# Patient Record
Sex: Female | Born: 1970 | Race: Black or African American | Hispanic: No | Marital: Single | Smoking: Former smoker
Health system: Southern US, Community
[De-identification: ages and names within clinical notes are randomized; demographics above are authoritative.]

## PROBLEM LIST (undated history)

## (undated) DIAGNOSIS — I10 Essential (primary) hypertension: Secondary | ICD-10-CM

## (undated) DIAGNOSIS — N289 Disorder of kidney and ureter, unspecified: Secondary | ICD-10-CM

## (undated) DIAGNOSIS — I509 Heart failure, unspecified: Secondary | ICD-10-CM

## (undated) DIAGNOSIS — I639 Cerebral infarction, unspecified: Secondary | ICD-10-CM

---

## 2007-02-11 ENCOUNTER — Ambulatory Visit: Payer: Self-pay | Admitting: Obstetrics & Gynecology

## 2007-02-12 ENCOUNTER — Ambulatory Visit: Payer: Self-pay | Admitting: Obstetrics & Gynecology

## 2007-02-18 ENCOUNTER — Ambulatory Visit: Payer: Self-pay | Admitting: Family Medicine

## 2007-02-22 ENCOUNTER — Ambulatory Visit (HOSPITAL_COMMUNITY): Admission: RE | Admit: 2007-02-22 | Discharge: 2007-02-22 | Payer: Self-pay | Admitting: Obstetrics & Gynecology

## 2007-02-24 ENCOUNTER — Ambulatory Visit: Payer: Self-pay | Admitting: Obstetrics & Gynecology

## 2007-03-08 ENCOUNTER — Ambulatory Visit: Payer: Self-pay | Admitting: Obstetrics & Gynecology

## 2007-03-22 ENCOUNTER — Ambulatory Visit: Payer: Self-pay | Admitting: Family Medicine

## 2007-03-22 ENCOUNTER — Ambulatory Visit (HOSPITAL_COMMUNITY): Admission: RE | Admit: 2007-03-22 | Discharge: 2007-03-22 | Payer: Self-pay | Admitting: Obstetrics & Gynecology

## 2007-04-07 ENCOUNTER — Ambulatory Visit (HOSPITAL_COMMUNITY): Admission: RE | Admit: 2007-04-07 | Discharge: 2007-04-07 | Payer: Self-pay | Admitting: Obstetrics & Gynecology

## 2007-04-15 ENCOUNTER — Ambulatory Visit: Payer: Self-pay | Admitting: Obstetrics & Gynecology

## 2007-05-17 ENCOUNTER — Encounter: Payer: Self-pay | Admitting: *Deleted

## 2007-05-17 ENCOUNTER — Ambulatory Visit: Payer: Self-pay | Admitting: Obstetrics & Gynecology

## 2007-05-17 ENCOUNTER — Ambulatory Visit: Payer: Self-pay | Admitting: *Deleted

## 2007-05-17 ENCOUNTER — Inpatient Hospital Stay (HOSPITAL_COMMUNITY): Admission: AD | Admit: 2007-05-17 | Discharge: 2007-05-21 | Payer: Self-pay | Admitting: Obstetrics & Gynecology

## 2007-05-24 ENCOUNTER — Encounter: Payer: Self-pay | Admitting: Obstetrics & Gynecology

## 2007-05-24 ENCOUNTER — Ambulatory Visit: Payer: Self-pay | Admitting: Obstetrics & Gynecology

## 2007-05-24 ENCOUNTER — Ambulatory Visit (HOSPITAL_COMMUNITY): Admission: RE | Admit: 2007-05-24 | Discharge: 2007-05-24 | Payer: Self-pay | Admitting: Obstetrics & Gynecology

## 2007-05-25 ENCOUNTER — Inpatient Hospital Stay (HOSPITAL_COMMUNITY): Admission: AD | Admit: 2007-05-25 | Discharge: 2007-05-26 | Payer: Self-pay | Admitting: Gynecology

## 2007-05-25 ENCOUNTER — Ambulatory Visit: Payer: Self-pay | Admitting: *Deleted

## 2007-05-26 ENCOUNTER — Encounter: Payer: Self-pay | Admitting: Obstetrics & Gynecology

## 2007-06-16 ENCOUNTER — Ambulatory Visit: Payer: Self-pay | Admitting: Obstetrics and Gynecology

## 2009-05-11 IMAGING — US US RENAL
1 series · 14 of 25 positions shown · non-contrast
Comparison: none

CLINICAL DATA: Hypertension; 24 week pregnant patient.
 RENAL/URINARY TRACT ULTRASOUND ? 05/18/07:
TECHNIQUE: Complete ultrasound examination of the urinary tract was performed including evaluation of the kidneys, renal collecting systems, and urinary bladder.

[Series 1: us renal · 0.28mm/px · 14 of 34 slices shown]
[im 1/34]
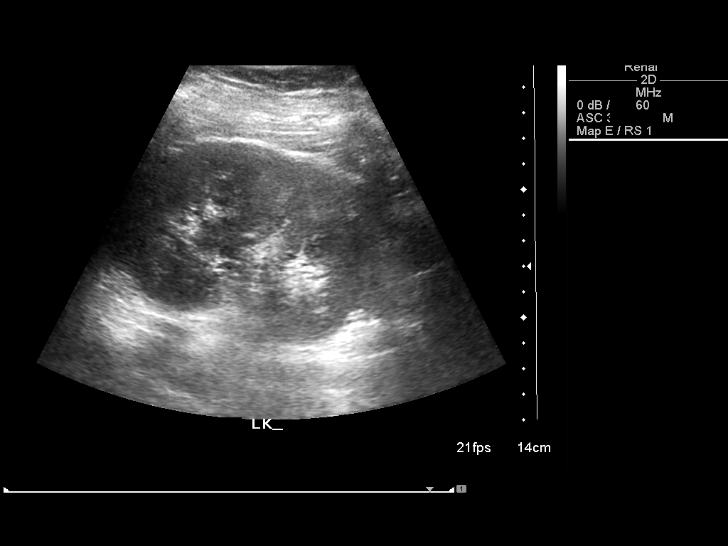
[im 3/34]
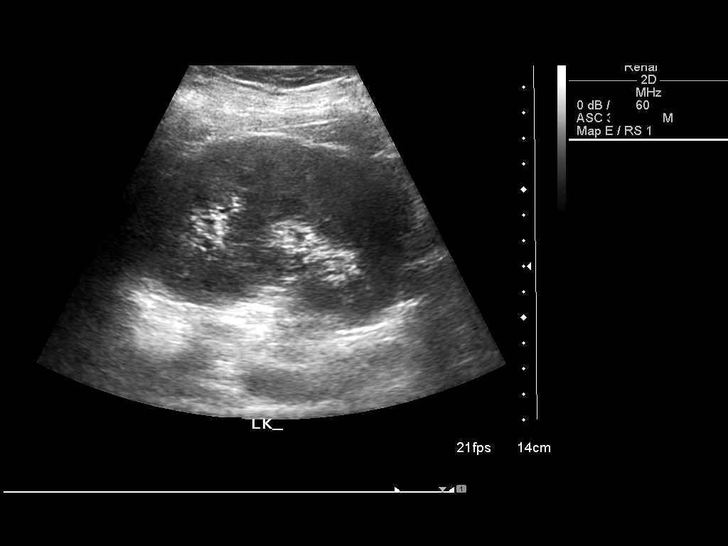
[im 6/34]
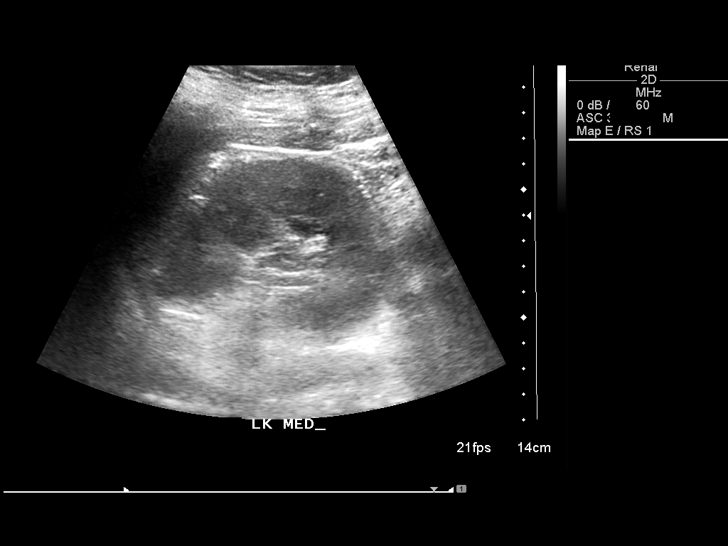
[im 9/34]
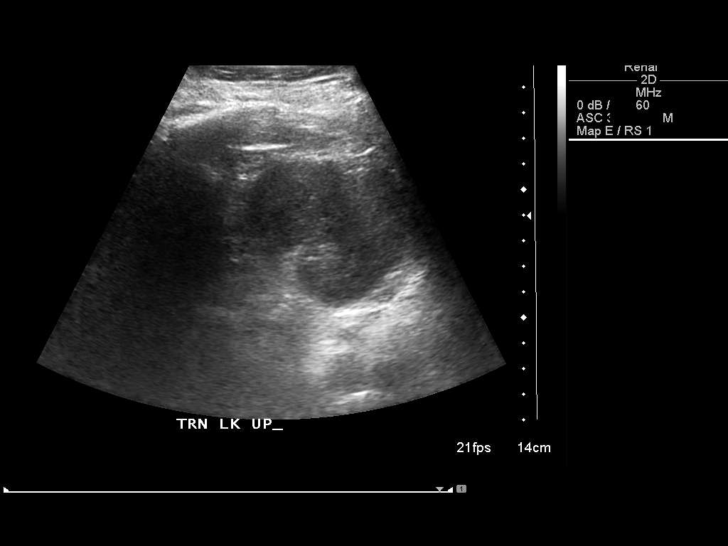
[im 12/34]
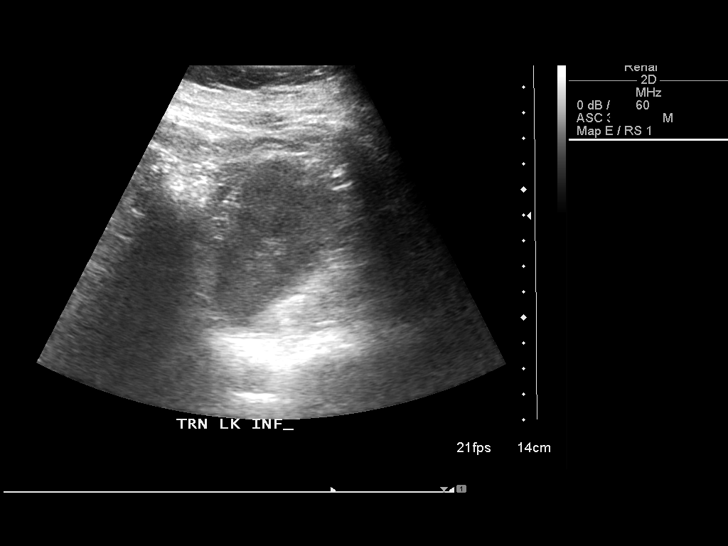
[im 13/34]
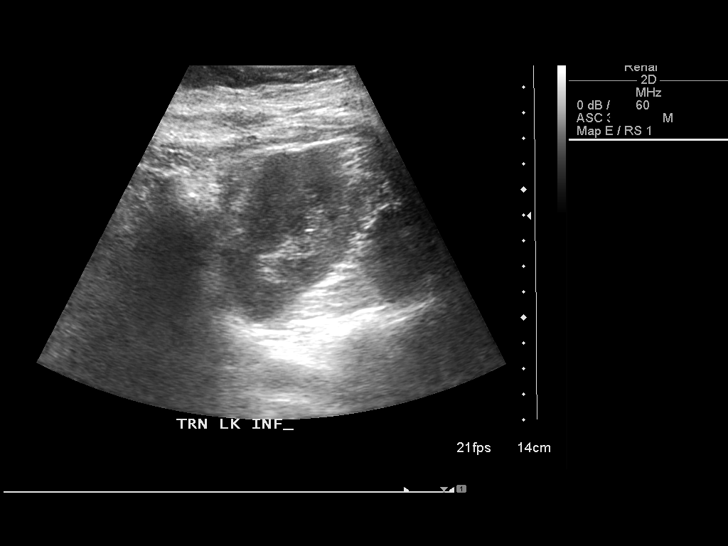
[im 16/34]
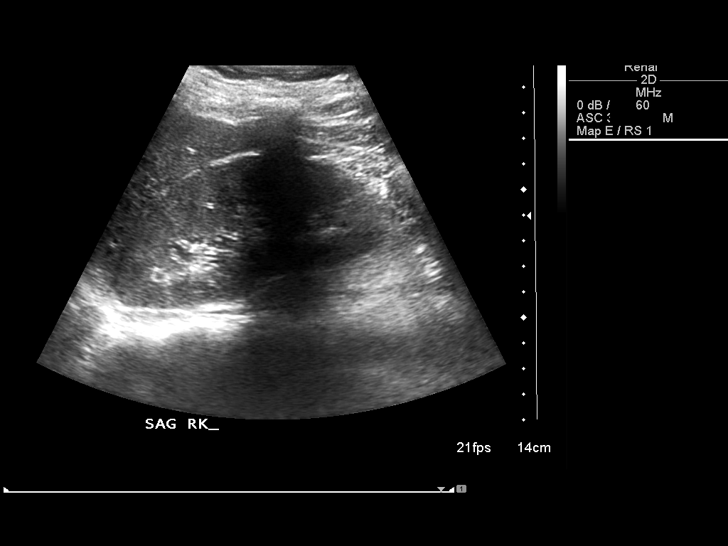
[im 18/34]
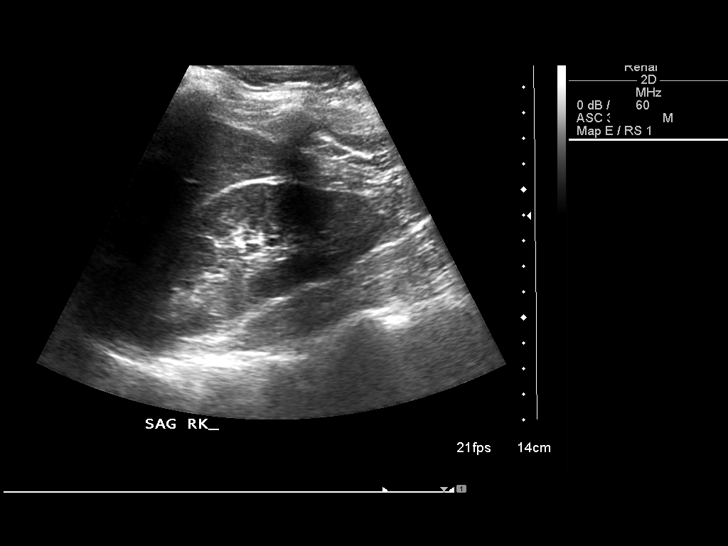
[im 21/34]
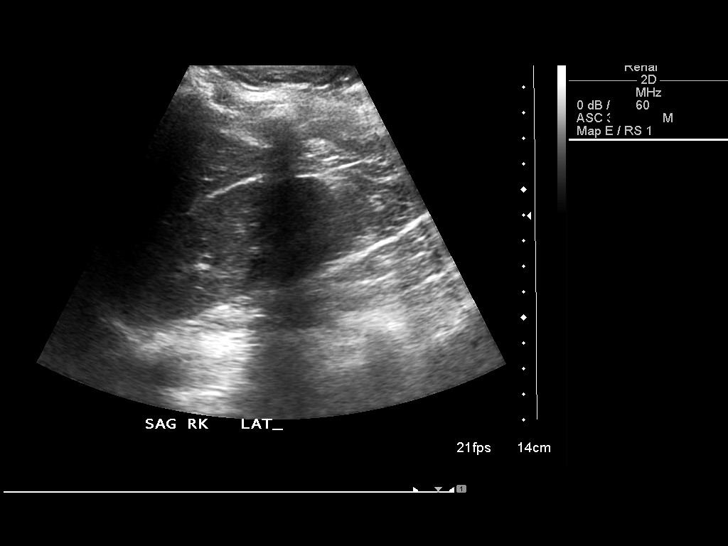
[im 23/34]
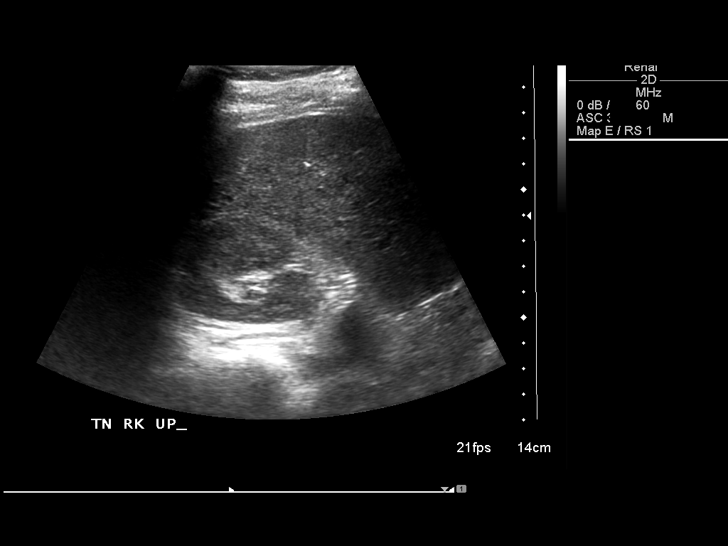
[im 25/34]
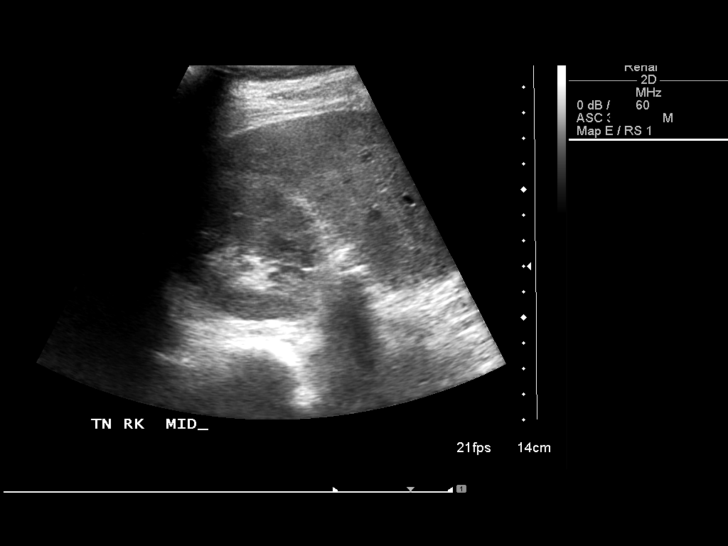
[im 28/34]
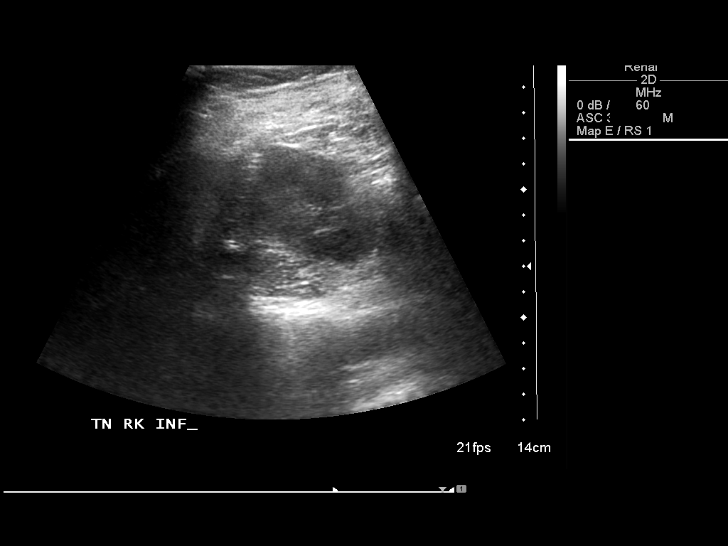
[im 31/34]
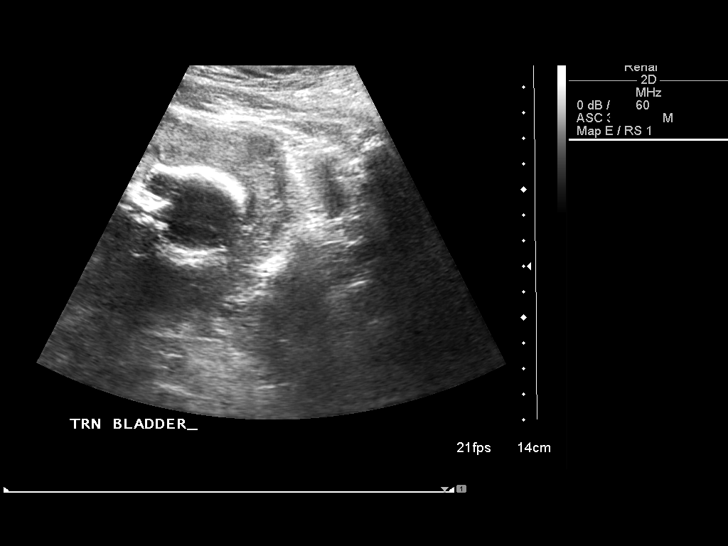
[im 34/34]
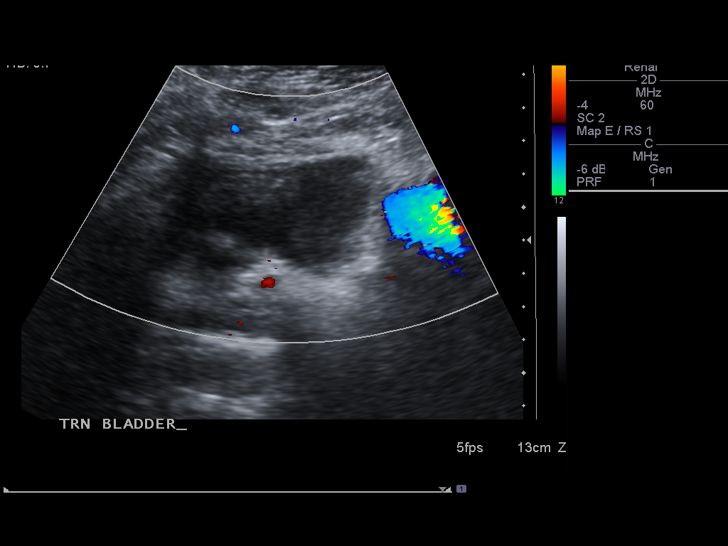

[14 of 25 positions shown; findings below may reference images not displayed]

FINDINGS: Both kidneys have a normal size, shape, and echotexture.  No hydronephrosis, renal mass, or echogenic calculi are seen. There is no renal cortical thinning bilaterally. The right kidney measures 11 cm in length, and the left kidney measures 10.1 cm in length.
IMPRESSION: Normal renal ultrasound.

## 2010-06-23 ENCOUNTER — Encounter: Payer: Self-pay | Admitting: *Deleted

## 2010-10-15 NOTE — Discharge Summary (Signed)
NAMEJADAYA, Allison Hill            ACCOUNT NO.:  [id number]   MEDICAL RECORD NO.:  [id number]          PATIENT TYPE:  WOC   LOCATION:  WOC                          FACILITY:  [facility]   PHYSICIAN:  Allie Bossier, MD        DATE OF BIRTH:  06/18/1970   DATE OF ADMISSION:  05/17/2007  DATE OF DISCHARGE:  05/21/2007                               DISCHARGE SUMMARY   ADMISSION DIAGNOSES:  1. Intrauterine pregnancy at 23 weeks, 6 days.  2. Chronic hypertension.  3. Concern for preeclampsia, rule out preeclampsia.  4. Intrauterine growth retardation.  5. Oligohydramnios.  6. History of intrauterine fetal demise x2.  7. History of spontaneous abortion x3.   DISCHARGE DIAGNOSES:  1. Intrauterine pregnancy at 23 weeks, 6 days.  2. Chronic hypertension.  3. Intrauterine growth retardation.  4. Oligohydramnios.  5. History of intrauterine fetal demise x2.  6. History of spontaneous abortion x3.   PROCEDURES:  1. Patient had an ultrasound performed on December 15th that showed      single gestation in cephalic position.  Placenta was fundal above      the cervical os.  AFI was 2.86.  Biophysical profile was 4/8.      Estimated fetal weight was 339 gm, which was less than the 10th      percentile.  2. Patient had a renal ultrasound performed on May 18, 2007      showing normal-appearing right and left kidneys without      hydronephrosis, mass, or calculi seen.   COMPLICATIONS:  None.   CONSULTATIONS:  1. Maternal fetal medicine was initially consulted at the time of      admission, Dr. Carolann Littler.  2. Neonatology:   PERTINENT LABORATORY FINDINGS:  Patient had an admission complete blood  count showing a white blood cell count of 8.8, hemoglobin 12.5,  hematocrit 36, platelets 227.  CMP showed sodium 139, potassium 3.4, AST  27, ALT 26.  The remainder of the labs on the CMP were normal.  Prothrombin time was 11.3.  PTT was 27.  LDH was 132.  Uric acid was  5.5.  A 24-hour urine  collection had a volume of 2700 ml, and a 24-hour  urine creatinine was 1504.  Creatinine clearance was 116.  A 24-hour  urine protein was 81.  Protein C was 121, within the normal range.  Protein S was 105, within normal range.  A phospholipid evaluation was  within normal limits.  Factor V gene mutation was negative.   BRIEF PERTINENT ADMISSION HISTORY:  This is a 40 year old G7, P1-2-3-1  presenting at 23 weeks, 6 days, admission for blood pressure control.  Patient does have chronic hypertension, a history of two intrauterine  fetal demises, and was seen in MFM on May 17, 2007.  At that time,  her blood pressures were 150s-160s/110s.  The patient was to be admitted  for evaluation of her hypertension, blood pressure control, as well as  evaluation for thrombophilia.   HOSPITAL COURSE:  The patient was admitted.  Her initial  antihypertensives of Norvasc 10 mg and labetalol 600  mg b.i.d. were  increased to labetalol 800 mg b.i.d.  She continued to run high blood  pressures up to the 180s/110s.  Her labetalol was increased to an  eventual dosage of 800 mg by mouth 3 times a day.  After reaching this  dosage, she was better controlled with her blood pressure in the 140s-  150s/80s-90s diastolic.  She did have a renal ultrasound that, as stated  above, was normal.  She had PIH labs and 24-hour urine done, which were  within normal range.  The 24-hour urine protein was 81.  She had a  thrombophilia workup that was within normal limits.  Patient did have a  neonatology consult to discuss the current prognosis for the infant, if  it was delivered the current stay.  Patient was made aware of the small  size of the infant and the difficulty in resuscitating this infant if it  were to deliver.  The patient was monitored briefly with Dopplers daily  for the first three days of admission, and they were all in the 140s.  Otherwise, there was no fetal monitoring performed.  She was stable   throughout her stay without any signs or symptoms of preeclampsia.  Her  physical exam was normal throughout her stay except for her elevated  blood pressures.  She was stable at the time of discharge.   DISCHARGE STATUS:  Stable.   DISCHARGE MEDICATIONS:  1. Continue prenatal vitamin 1 tablet p.o. daily.  2. Continue Norvasc 10 mg p.o. daily.  3. Labetalol 800 mg p.o. 3 times daily.   DISCHARGE INSTRUCTIONS:  1. Discharge home.  2. Regular diet.  3. Activity as tolerated.  Nothing per vagina for the remainder of the      pregnancy.  4. Patient is to follow up with maternal fetal care for an ultrasound      on June 01, 2007 at 9:30 a.m.  5. Patient is to follow up at the high risk clinic on May 24, 2007 at 10:45 for an appointment.      Karlton Lemon, MD  Electronically Signed     ______________________________  Allie Bossier, MD    NS/MEDQ  D:  05/21/2007  T:  05/21/2007  Job:  [id number]

## 2010-10-15 NOTE — Group Therapy Note (Signed)
NAMEKORTNEE, BAS NO.:  [id number]   MEDICAL RECORD NO.:  [id number]          PATIENT TYPE:  WOC   LOCATION:  [facility]                   FACILITY:  [facility]   PHYSICIAN:  Ginger Carne, MD DATE OF BIRTH:  01-15-1971   DATE OF SERVICE:  06/16/2007                                  CLINIC NOTE   CHIEF COMPLAINT:  Followup IUFD.   HISTORY OF PRESENT ILLNESS:  This is a 40 year old female who had an  intrauterine fetal demise on May 25, 2007 approximately 3 weeks  ago.  She has done well since this time.  Her bleeding has been minimal,  and mostly maroon-tinged mild bleeding.  She did have some cramping last  week; however, this has since been resolved.  She denies any fevers.  Overall she feels well.  She has a good support at home with her mother  helping her out a lot.  She does desire a pregnancy in the future,  however, not at this time.  She wants to be ready and wants her blood  pressure to be under control prior to getting pregnant.  Because of this  she requests Depo-Provera.  The patient has had protected sexual  intercourse within these 3 weeks without complications.   PHYSICAL EXAMINATION:  VITAL SIGNS:  Temp 97.7, pulse 83, blood pressure  156/106, repeated at 142/100.  Weight is 203.  GENERAL:  No acute distress.  GENITOURINARY EXAM:  Cervix is closed without any blood present at this  time.  There was white discharge.  The cervix is nontender.  Bimanual  exam was without masses.   ASSESSMENT AND PLAN:  A 40 year old female, about 3 weeks status post  intrauterine fetal demise.  1. Intrauterine fetal demise:  The patient has done well since this      time.  She seems to be doing well emotionally as well as      physically.  No further action is needed in regard to the      intrauterine fetal demise.  The patient was advised to not attempt      to get pregnant for another 6 months.  2. Family planning/contraception:  The patient desires  Depo-Provera      for contraception at this time.  She has used this before.  Even if      she has had sexual intercourse, although it was protected, the      patient was hesitant when stating this.  Just to be certain, we      will get a pregnancy test, and if negative, we will give her the      Depo-Provera shot today.  The nurse had just walked in, it was      negative.  We will proceed with Depo-Provera.  3. Hypertension:  The patient has chronic hypertension.  It was      uncontrolled today, and likely this is because the patient reduced      her labetalol to 600 mg b.i.d. down from 800 t.i.d.  I advised the      patient that she should increase back to her previous dose of 800  mg t.i.d.  Also, she needs to set up with a primary care physician      of some sort.  She lives in [place], we do not have a list of      physicians there; however, I advised her to call Dr. Alyce Pagan and      __________ , her [facility] physicians in [facility] who can provide her      with the name of a      primary care physician.  The patient said that these physicians,      Alyce Pagan and __________ were managing her hypertension prior to      this pregnancy.      Johney Maine, MD    ______________________________  Ginger Carne, MD    JT/MEDQ  D:  06/16/2007  T:  06/16/2007  Job:  [id number]

## 2011-02-20 LAB — POCT PREGNANCY, URINE
Operator id: 148111
Preg Test, Ur: NEGATIVE

## 2011-03-07 LAB — ANTIPHOSPHOLIPID SYNDROME EVAL, BLD
Antiphosphatidylserine IgM: <25 MPS U/mL (ref <25.0)
Lupus Anticoagulant: NOT DETECTED
PTT Lupus Anticoagulant: 40.7 (ref 36.3–48.8)

## 2011-03-07 LAB — COMPREHENSIVE METABOLIC PANEL
AST: 27
Albumin: 3.1 — ABNORMAL LOW
BUN: 8
Calcium: 9.3
Creatinine, Ser: 0.87
GFR calc non Af Amer: >60
Total Bilirubin: 0.4
Total Protein: 7

## 2011-03-07 LAB — POCT URINALYSIS DIP (DEVICE)
Bilirubin Urine: NEGATIVE
Glucose, UA: NEGATIVE
Hgb urine dipstick: NEGATIVE
Ketones, ur: NEGATIVE
Nitrite: NEGATIVE
Operator id: 194561
Protein, ur: NEGATIVE
pH: 5.5
pH: 7

## 2011-03-07 LAB — LACTATE DEHYDROGENASE: LDH: 132

## 2011-03-07 LAB — CBC
HCT: 33.6 — ABNORMAL LOW
HCT: 36
HCT: 37.4
Hemoglobin: 11.7 — ABNORMAL LOW
Hemoglobin: 12.5
MCHC: 34.6
MCHC: 34.6
MCV: 87.2
MCV: 88.5
MCV: 88.5
RBC: 3.85 — ABNORMAL LOW
RBC: 4.07
RBC: 4.22
WBC: 9.8

## 2011-03-07 LAB — FIBRINOGEN: Fibrinogen: 539 — ABNORMAL HIGH

## 2011-03-07 LAB — PROTIME-INR
INR: 0.8
INR: 0.8
Prothrombin Time: 11.4 — ABNORMAL LOW

## 2011-03-07 LAB — D-DIMER, QUANTITATIVE: D-Dimer, Quant: 0.66 — ABNORMAL HIGH

## 2011-03-07 LAB — CREATININE CLEARANCE, URINE, 24 HOUR: Creatinine: 0.9

## 2011-03-07 LAB — PROTEIN, URINE, 24 HOUR: Collection Interval-UPROT: 24

## 2011-03-12 LAB — POCT URINALYSIS DIP (DEVICE)
Hgb urine dipstick: NEGATIVE
Protein, ur: 30 — AB
Specific Gravity, Urine: >=1.03
pH: 6

## 2015-03-24 DIAGNOSIS — F1920 Other psychoactive substance dependence, uncomplicated: Secondary | ICD-10-CM | POA: Diagnosis present

## 2016-06-03 DIAGNOSIS — I10 Essential (primary) hypertension: Secondary | ICD-10-CM | POA: Diagnosis not present

## 2016-06-03 DIAGNOSIS — N390 Urinary tract infection, site not specified: Secondary | ICD-10-CM | POA: Diagnosis not present

## 2016-06-03 DIAGNOSIS — N183 Chronic kidney disease, stage 3 (moderate): Secondary | ICD-10-CM | POA: Diagnosis not present

## 2016-06-03 DIAGNOSIS — E559 Vitamin D deficiency, unspecified: Secondary | ICD-10-CM | POA: Diagnosis not present

## 2016-06-13 DIAGNOSIS — N183 Chronic kidney disease, stage 3 (moderate): Secondary | ICD-10-CM | POA: Diagnosis not present

## 2016-06-13 DIAGNOSIS — E559 Vitamin D deficiency, unspecified: Secondary | ICD-10-CM | POA: Diagnosis not present

## 2016-06-25 DIAGNOSIS — N183 Chronic kidney disease, stage 3 (moderate): Secondary | ICD-10-CM | POA: Diagnosis not present

## 2016-06-25 DIAGNOSIS — N390 Urinary tract infection, site not specified: Secondary | ICD-10-CM | POA: Diagnosis not present

## 2016-06-25 DIAGNOSIS — I10 Essential (primary) hypertension: Secondary | ICD-10-CM | POA: Diagnosis not present

## 2016-07-23 DIAGNOSIS — Z113 Encounter for screening for infections with a predominantly sexual mode of transmission: Secondary | ICD-10-CM | POA: Diagnosis not present

## 2016-07-23 DIAGNOSIS — Z30432 Encounter for removal of intrauterine contraceptive device: Secondary | ICD-10-CM | POA: Diagnosis not present

## 2016-07-23 DIAGNOSIS — R5383 Other fatigue: Secondary | ICD-10-CM | POA: Diagnosis not present

## 2016-07-27 DIAGNOSIS — Z8673 Personal history of transient ischemic attack (TIA), and cerebral infarction without residual deficits: Secondary | ICD-10-CM | POA: Diagnosis not present

## 2016-07-27 DIAGNOSIS — F17210 Nicotine dependence, cigarettes, uncomplicated: Secondary | ICD-10-CM | POA: Diagnosis not present

## 2016-07-27 DIAGNOSIS — D573 Sickle-cell trait: Secondary | ICD-10-CM | POA: Diagnosis not present

## 2016-07-27 DIAGNOSIS — F1410 Cocaine abuse, uncomplicated: Secondary | ICD-10-CM | POA: Diagnosis not present

## 2016-07-27 DIAGNOSIS — R069 Unspecified abnormalities of breathing: Secondary | ICD-10-CM | POA: Diagnosis not present

## 2016-07-27 DIAGNOSIS — R079 Chest pain, unspecified: Secondary | ICD-10-CM | POA: Diagnosis not present

## 2016-07-27 DIAGNOSIS — E876 Hypokalemia: Secondary | ICD-10-CM | POA: Diagnosis not present

## 2016-07-27 DIAGNOSIS — I129 Hypertensive chronic kidney disease with stage 1 through stage 4 chronic kidney disease, or unspecified chronic kidney disease: Secondary | ICD-10-CM | POA: Diagnosis not present

## 2016-07-27 DIAGNOSIS — R0602 Shortness of breath: Secondary | ICD-10-CM | POA: Diagnosis not present

## 2016-07-27 DIAGNOSIS — N189 Chronic kidney disease, unspecified: Secondary | ICD-10-CM | POA: Diagnosis not present

## 2016-09-10 DIAGNOSIS — H47211 Primary optic atrophy, right eye: Secondary | ICD-10-CM | POA: Diagnosis not present

## 2016-09-10 DIAGNOSIS — H52223 Regular astigmatism, bilateral: Secondary | ICD-10-CM | POA: Diagnosis not present

## 2016-09-10 DIAGNOSIS — H5213 Myopia, bilateral: Secondary | ICD-10-CM | POA: Diagnosis not present

## 2016-09-10 DIAGNOSIS — H53451 Other localized visual field defect, right eye: Secondary | ICD-10-CM | POA: Diagnosis not present

## 2016-09-12 DIAGNOSIS — D573 Sickle-cell trait: Secondary | ICD-10-CM | POA: Diagnosis not present

## 2016-09-12 DIAGNOSIS — K5732 Diverticulitis of large intestine without perforation or abscess without bleeding: Secondary | ICD-10-CM | POA: Diagnosis not present

## 2016-09-12 DIAGNOSIS — R350 Frequency of micturition: Secondary | ICD-10-CM | POA: Diagnosis not present

## 2016-09-12 DIAGNOSIS — Z8673 Personal history of transient ischemic attack (TIA), and cerebral infarction without residual deficits: Secondary | ICD-10-CM | POA: Diagnosis not present

## 2016-09-12 DIAGNOSIS — R1031 Right lower quadrant pain: Secondary | ICD-10-CM | POA: Diagnosis not present

## 2016-09-12 DIAGNOSIS — I10 Essential (primary) hypertension: Secondary | ICD-10-CM | POA: Diagnosis not present

## 2016-09-12 DIAGNOSIS — F17210 Nicotine dependence, cigarettes, uncomplicated: Secondary | ICD-10-CM | POA: Diagnosis not present

## 2016-09-12 DIAGNOSIS — I129 Hypertensive chronic kidney disease with stage 1 through stage 4 chronic kidney disease, or unspecified chronic kidney disease: Secondary | ICD-10-CM | POA: Diagnosis not present

## 2016-09-12 DIAGNOSIS — Z8249 Family history of ischemic heart disease and other diseases of the circulatory system: Secondary | ICD-10-CM | POA: Diagnosis not present

## 2016-09-12 DIAGNOSIS — R109 Unspecified abdominal pain: Secondary | ICD-10-CM | POA: Diagnosis not present

## 2016-09-12 DIAGNOSIS — N189 Chronic kidney disease, unspecified: Secondary | ICD-10-CM | POA: Diagnosis not present

## 2016-09-26 DIAGNOSIS — I10 Essential (primary) hypertension: Secondary | ICD-10-CM | POA: Diagnosis not present

## 2016-09-26 DIAGNOSIS — N183 Chronic kidney disease, stage 3 (moderate): Secondary | ICD-10-CM | POA: Diagnosis not present

## 2016-10-16 DIAGNOSIS — K625 Hemorrhage of anus and rectum: Secondary | ICD-10-CM | POA: Diagnosis not present

## 2016-10-16 DIAGNOSIS — Z5321 Procedure and treatment not carried out due to patient leaving prior to being seen by health care provider: Secondary | ICD-10-CM | POA: Diagnosis not present

## 2016-12-08 DIAGNOSIS — R404 Transient alteration of awareness: Secondary | ICD-10-CM | POA: Diagnosis not present

## 2016-12-08 DIAGNOSIS — R42 Dizziness and giddiness: Secondary | ICD-10-CM | POA: Diagnosis not present

## 2016-12-31 DIAGNOSIS — M722 Plantar fascial fibromatosis: Secondary | ICD-10-CM | POA: Diagnosis not present

## 2016-12-31 DIAGNOSIS — M2141 Flat foot [pes planus] (acquired), right foot: Secondary | ICD-10-CM | POA: Diagnosis not present

## 2017-01-05 DIAGNOSIS — Z01419 Encounter for gynecological examination (general) (routine) without abnormal findings: Secondary | ICD-10-CM | POA: Diagnosis not present

## 2017-01-05 DIAGNOSIS — N926 Irregular menstruation, unspecified: Secondary | ICD-10-CM | POA: Diagnosis not present

## 2017-01-05 DIAGNOSIS — N898 Other specified noninflammatory disorders of vagina: Secondary | ICD-10-CM | POA: Diagnosis not present

## 2017-01-20 DIAGNOSIS — N938 Other specified abnormal uterine and vaginal bleeding: Secondary | ICD-10-CM | POA: Diagnosis not present

## 2017-02-11 DIAGNOSIS — S01511A Laceration without foreign body of lip, initial encounter: Secondary | ICD-10-CM | POA: Diagnosis not present

## 2017-02-11 DIAGNOSIS — R531 Weakness: Secondary | ICD-10-CM | POA: Diagnosis not present

## 2017-02-11 DIAGNOSIS — I120 Hypertensive chronic kidney disease with stage 5 chronic kidney disease or end stage renal disease: Secondary | ICD-10-CM | POA: Diagnosis not present

## 2017-02-11 DIAGNOSIS — R42 Dizziness and giddiness: Secondary | ICD-10-CM | POA: Diagnosis not present

## 2017-02-11 DIAGNOSIS — R9089 Other abnormal findings on diagnostic imaging of central nervous system: Secondary | ICD-10-CM | POA: Diagnosis not present

## 2017-02-11 DIAGNOSIS — R404 Transient alteration of awareness: Secondary | ICD-10-CM | POA: Diagnosis not present

## 2017-02-11 DIAGNOSIS — Y999 Unspecified external cause status: Secondary | ICD-10-CM | POA: Diagnosis not present

## 2017-02-11 DIAGNOSIS — E876 Hypokalemia: Secondary | ICD-10-CM | POA: Diagnosis not present

## 2017-02-11 DIAGNOSIS — R9431 Abnormal electrocardiogram [ECG] [EKG]: Secondary | ICD-10-CM | POA: Diagnosis not present

## 2017-02-11 DIAGNOSIS — M542 Cervicalgia: Secondary | ICD-10-CM | POA: Diagnosis not present

## 2017-02-11 DIAGNOSIS — W1839XA Other fall on same level, initial encounter: Secondary | ICD-10-CM | POA: Diagnosis not present

## 2017-02-11 DIAGNOSIS — S025XXA Fracture of tooth (traumatic), initial encounter for closed fracture: Secondary | ICD-10-CM | POA: Diagnosis not present

## 2017-02-11 DIAGNOSIS — R55 Syncope and collapse: Secondary | ICD-10-CM | POA: Diagnosis not present

## 2017-05-27 DIAGNOSIS — D571 Sickle-cell disease without crisis: Secondary | ICD-10-CM | POA: Diagnosis not present

## 2017-05-27 DIAGNOSIS — I517 Cardiomegaly: Secondary | ICD-10-CM | POA: Diagnosis not present

## 2017-05-27 DIAGNOSIS — K219 Gastro-esophageal reflux disease without esophagitis: Secondary | ICD-10-CM | POA: Diagnosis not present

## 2017-05-27 DIAGNOSIS — I129 Hypertensive chronic kidney disease with stage 1 through stage 4 chronic kidney disease, or unspecified chronic kidney disease: Secondary | ICD-10-CM | POA: Diagnosis not present

## 2017-05-27 DIAGNOSIS — R2689 Other abnormalities of gait and mobility: Secondary | ICD-10-CM | POA: Diagnosis not present

## 2017-05-27 DIAGNOSIS — F1420 Cocaine dependence, uncomplicated: Secondary | ICD-10-CM | POA: Diagnosis not present

## 2017-05-27 DIAGNOSIS — F1210 Cannabis abuse, uncomplicated: Secondary | ICD-10-CM | POA: Diagnosis not present

## 2017-05-27 DIAGNOSIS — R200 Anesthesia of skin: Secondary | ICD-10-CM | POA: Diagnosis not present

## 2017-05-27 DIAGNOSIS — N179 Acute kidney failure, unspecified: Secondary | ICD-10-CM | POA: Diagnosis not present

## 2017-05-27 DIAGNOSIS — Z7982 Long term (current) use of aspirin: Secondary | ICD-10-CM | POA: Diagnosis not present

## 2017-05-27 DIAGNOSIS — E876 Hypokalemia: Secondary | ICD-10-CM | POA: Diagnosis not present

## 2017-05-27 DIAGNOSIS — I10 Essential (primary) hypertension: Secondary | ICD-10-CM | POA: Diagnosis not present

## 2017-05-27 DIAGNOSIS — N2581 Secondary hyperparathyroidism of renal origin: Secondary | ICD-10-CM | POA: Diagnosis not present

## 2017-05-27 DIAGNOSIS — F1910 Other psychoactive substance abuse, uncomplicated: Secondary | ICD-10-CM | POA: Diagnosis not present

## 2017-05-27 DIAGNOSIS — I6381 Other cerebral infarction due to occlusion or stenosis of small artery: Secondary | ICD-10-CM | POA: Diagnosis not present

## 2017-05-27 DIAGNOSIS — N183 Chronic kidney disease, stage 3 (moderate): Secondary | ICD-10-CM | POA: Diagnosis not present

## 2017-05-27 DIAGNOSIS — I639 Cerebral infarction, unspecified: Secondary | ICD-10-CM | POA: Diagnosis not present

## 2017-05-27 DIAGNOSIS — F1410 Cocaine abuse, uncomplicated: Secondary | ICD-10-CM | POA: Diagnosis not present

## 2017-05-27 DIAGNOSIS — I6523 Occlusion and stenosis of bilateral carotid arteries: Secondary | ICD-10-CM | POA: Diagnosis not present

## 2017-05-27 DIAGNOSIS — R9431 Abnormal electrocardiogram [ECG] [EKG]: Secondary | ICD-10-CM | POA: Diagnosis not present

## 2017-05-27 DIAGNOSIS — F1010 Alcohol abuse, uncomplicated: Secondary | ICD-10-CM | POA: Diagnosis not present

## 2017-05-27 DIAGNOSIS — F17210 Nicotine dependence, cigarettes, uncomplicated: Secondary | ICD-10-CM | POA: Diagnosis not present

## 2017-05-27 DIAGNOSIS — R202 Paresthesia of skin: Secondary | ICD-10-CM | POA: Diagnosis not present

## 2017-06-03 ENCOUNTER — Other Ambulatory Visit: Payer: Self-pay | Admitting: *Deleted

## 2017-06-03 DIAGNOSIS — F1910 Other psychoactive substance abuse, uncomplicated: Secondary | ICD-10-CM | POA: Diagnosis not present

## 2017-06-03 DIAGNOSIS — I120 Hypertensive chronic kidney disease with stage 5 chronic kidney disease or end stage renal disease: Secondary | ICD-10-CM | POA: Diagnosis not present

## 2017-06-03 DIAGNOSIS — N184 Chronic kidney disease, stage 4 (severe): Secondary | ICD-10-CM | POA: Diagnosis not present

## 2017-06-03 DIAGNOSIS — I639 Cerebral infarction, unspecified: Secondary | ICD-10-CM | POA: Diagnosis not present

## 2017-06-03 DIAGNOSIS — R0789 Other chest pain: Secondary | ICD-10-CM | POA: Diagnosis not present

## 2017-06-03 DIAGNOSIS — I129 Hypertensive chronic kidney disease with stage 1 through stage 4 chronic kidney disease, or unspecified chronic kidney disease: Secondary | ICD-10-CM | POA: Diagnosis not present

## 2017-06-03 DIAGNOSIS — I6523 Occlusion and stenosis of bilateral carotid arteries: Secondary | ICD-10-CM | POA: Diagnosis not present

## 2017-06-03 DIAGNOSIS — R079 Chest pain, unspecified: Secondary | ICD-10-CM | POA: Diagnosis not present

## 2017-06-03 DIAGNOSIS — I6381 Other cerebral infarction due to occlusion or stenosis of small artery: Secondary | ICD-10-CM | POA: Diagnosis not present

## 2017-06-03 DIAGNOSIS — F1410 Cocaine abuse, uncomplicated: Secondary | ICD-10-CM | POA: Diagnosis not present

## 2017-06-03 DIAGNOSIS — R109 Unspecified abdominal pain: Secondary | ICD-10-CM | POA: Diagnosis not present

## 2017-06-03 DIAGNOSIS — R42 Dizziness and giddiness: Secondary | ICD-10-CM | POA: Diagnosis not present

## 2017-06-03 DIAGNOSIS — E785 Hyperlipidemia, unspecified: Secondary | ICD-10-CM | POA: Diagnosis not present

## 2017-06-03 DIAGNOSIS — Z7982 Long term (current) use of aspirin: Secondary | ICD-10-CM | POA: Diagnosis not present

## 2017-06-03 DIAGNOSIS — K219 Gastro-esophageal reflux disease without esophagitis: Secondary | ICD-10-CM | POA: Diagnosis not present

## 2017-06-03 DIAGNOSIS — R0602 Shortness of breath: Secondary | ICD-10-CM | POA: Diagnosis not present

## 2017-06-03 DIAGNOSIS — Z8673 Personal history of transient ischemic attack (TIA), and cerebral infarction without residual deficits: Secondary | ICD-10-CM | POA: Diagnosis not present

## 2017-06-03 DIAGNOSIS — I517 Cardiomegaly: Secondary | ICD-10-CM | POA: Diagnosis not present

## 2017-06-03 DIAGNOSIS — N183 Chronic kidney disease, stage 3 (moderate): Secondary | ICD-10-CM | POA: Diagnosis not present

## 2017-06-03 DIAGNOSIS — N186 End stage renal disease: Secondary | ICD-10-CM | POA: Diagnosis not present

## 2017-06-03 DIAGNOSIS — I10 Essential (primary) hypertension: Secondary | ICD-10-CM | POA: Diagnosis not present

## 2017-06-03 DIAGNOSIS — F17210 Nicotine dependence, cigarettes, uncomplicated: Secondary | ICD-10-CM | POA: Diagnosis not present

## 2017-06-03 NOTE — Patient Outreach (Addendum)
[facility]) Care Management  06/03/2017  Allison Hill Jun 30, 1970 [id number]  Transition of Care Referral  Referral Date: 06/03/17 Referral Source: [employer] Date of Discharge: 05/29/17 [facility] Discharge Diagnosis: N/A Insurance: [employer]  Outreach attempt #1 to patient. No answer. Number is invalid.  Plan: RN CM will contact patient within one week.   Wynelle ClevelandJuanita Zamyiah Tino, RN, BSN, MHA/MSL, [facility] Telephonic Care Manager Coordinator [facility] Direct Phone: [phone] Toll Free: [phone] Fax: [phone]

## 2017-06-04 ENCOUNTER — Ambulatory Visit: Payer: Self-pay | Admitting: *Deleted

## 2017-06-04 ENCOUNTER — Other Ambulatory Visit: Payer: Self-pay | Admitting: *Deleted

## 2017-06-04 DIAGNOSIS — F1020 Alcohol dependence, uncomplicated: Secondary | ICD-10-CM | POA: Diagnosis not present

## 2017-06-04 DIAGNOSIS — I6381 Other cerebral infarction due to occlusion or stenosis of small artery: Secondary | ICD-10-CM | POA: Diagnosis not present

## 2017-06-04 DIAGNOSIS — I1310 Hypertensive heart and chronic kidney disease without heart failure, with stage 1 through stage 4 chronic kidney disease, or unspecified chronic kidney disease: Secondary | ICD-10-CM | POA: Diagnosis not present

## 2017-06-04 DIAGNOSIS — R0789 Other chest pain: Secondary | ICD-10-CM | POA: Diagnosis not present

## 2017-06-04 DIAGNOSIS — K219 Gastro-esophageal reflux disease without esophagitis: Secondary | ICD-10-CM | POA: Diagnosis not present

## 2017-06-04 DIAGNOSIS — F419 Anxiety disorder, unspecified: Secondary | ICD-10-CM | POA: Diagnosis not present

## 2017-06-04 DIAGNOSIS — N184 Chronic kidney disease, stage 4 (severe): Secondary | ICD-10-CM | POA: Diagnosis not present

## 2017-06-04 DIAGNOSIS — Z7982 Long term (current) use of aspirin: Secondary | ICD-10-CM | POA: Diagnosis not present

## 2017-06-04 DIAGNOSIS — I10 Essential (primary) hypertension: Secondary | ICD-10-CM | POA: Diagnosis not present

## 2017-06-04 DIAGNOSIS — E7849 Other hyperlipidemia: Secondary | ICD-10-CM | POA: Diagnosis not present

## 2017-06-04 DIAGNOSIS — N183 Chronic kidney disease, stage 3 (moderate): Secondary | ICD-10-CM | POA: Diagnosis not present

## 2017-06-04 DIAGNOSIS — R079 Chest pain, unspecified: Secondary | ICD-10-CM | POA: Diagnosis not present

## 2017-06-04 DIAGNOSIS — I129 Hypertensive chronic kidney disease with stage 1 through stage 4 chronic kidney disease, or unspecified chronic kidney disease: Secondary | ICD-10-CM | POA: Diagnosis not present

## 2017-06-04 DIAGNOSIS — F1910 Other psychoactive substance abuse, uncomplicated: Secondary | ICD-10-CM | POA: Diagnosis not present

## 2017-06-04 DIAGNOSIS — Z8672 Personal history of thrombophlebitis: Secondary | ICD-10-CM | POA: Diagnosis not present

## 2017-06-04 DIAGNOSIS — F17210 Nicotine dependence, cigarettes, uncomplicated: Secondary | ICD-10-CM | POA: Diagnosis not present

## 2017-06-04 MED ORDER — GUAIFENESIN-DM 100-10 MG/5ML PO SYRP
5.00 | ORAL_SOLUTION | ORAL | Status: DC
Start: ? — End: 2017-06-04

## 2017-06-04 MED ORDER — CHOLECALCIFEROL 25 MCG (1000 UT) PO TABS
1000.00 | ORAL_TABLET | ORAL | Status: DC
Start: 2017-06-05 — End: 2017-06-04

## 2017-06-04 MED ORDER — ASPIRIN 325 MG PO TABS
325.00 | ORAL_TABLET | ORAL | Status: DC
Start: 2017-06-05 — End: 2017-06-04

## 2017-06-04 MED ORDER — GENERIC EXTERNAL MEDICATION
300.00 | Status: DC
Start: 2017-06-04 — End: 2017-06-04

## 2017-06-04 MED ORDER — HYDRALAZINE HCL 50 MG PO TABS
100.00 | ORAL_TABLET | ORAL | Status: DC
Start: 2017-06-04 — End: 2017-06-04

## 2017-06-04 MED ORDER — INFLUENZA VAC SPLIT QUAD 0.5 ML IM SUSY
.50 | PREFILLED_SYRINGE | INTRAMUSCULAR | Status: DC
Start: ? — End: 2017-06-04

## 2017-06-04 MED ORDER — LOSARTAN POTASSIUM 50 MG PO TABS
100.00 | ORAL_TABLET | ORAL | Status: DC
Start: 2017-06-05 — End: 2017-06-04

## 2017-06-04 MED ORDER — ONDANSETRON 4 MG PO TBDP
4.00 | ORAL_TABLET | ORAL | Status: DC
Start: ? — End: 2017-06-04

## 2017-06-04 MED ORDER — AMILORIDE HCL 5 MG PO TABS
5.00 | ORAL_TABLET | ORAL | Status: DC
Start: 2017-06-04 — End: 2017-06-04

## 2017-06-04 MED ORDER — ATORVASTATIN CALCIUM 40 MG PO TABS
40.00 | ORAL_TABLET | ORAL | Status: DC
Start: 2017-06-04 — End: 2017-06-04

## 2017-06-04 MED ORDER — HYDROXYZINE HCL 25 MG PO TABS
25.00 | ORAL_TABLET | ORAL | Status: DC
Start: ? — End: 2017-06-04

## 2017-06-04 MED ORDER — ONDANSETRON HCL 4 MG/2ML IJ SOLN
4.00 | INTRAMUSCULAR | Status: DC
Start: ? — End: 2017-06-04

## 2017-06-04 MED ORDER — NIFEDIPINE ER OSMOTIC RELEASE 30 MG PO TB24
90.00 | ORAL_TABLET | ORAL | Status: DC
Start: 2017-06-05 — End: 2017-06-04

## 2017-06-04 MED ORDER — ACETAMINOPHEN 325 MG PO TABS
650.00 | ORAL_TABLET | ORAL | Status: DC
Start: ? — End: 2017-06-04

## 2017-06-04 MED ORDER — HEPARIN SODIUM (PORCINE) 5000 UNIT/ML IJ SOLN
5000.00 | INTRAMUSCULAR | Status: DC
Start: 2017-06-04 — End: 2017-06-04

## 2017-06-04 MED ORDER — DOCUSATE SODIUM 100 MG PO CAPS
100.00 | ORAL_CAPSULE | ORAL | Status: DC
Start: ? — End: 2017-06-04

## 2017-06-04 NOTE — Patient Outreach (Signed)
[facility]) Care Management  06/04/2017  Lyman Spellerolissa Arbaugh 1970/09/25 [id number]   Transition of Care Referral  Referral Date: 06/03/17 Referral Source: [employer] Date of Discharge: 05/29/17 [facility] Discharge Diagnosis: N/A [facility]: [facility]  Outreach attempt # 2 to patient. No answer. Number is invalid.  Plan: RN CM will contact patient within one week.   Wynelle ClevelandJuanita Hinton Luellen, RN, BSN, MHA/MSL, [facility] Telephonic Care Manager Coordinator [facility] Direct Phone: [phone] Cell Phone: [phone] Toll Free: [phone] Fax: [phone]

## 2017-06-09 ENCOUNTER — Encounter: Payer: Self-pay | Admitting: *Deleted

## 2017-06-09 ENCOUNTER — Other Ambulatory Visit: Payer: Self-pay | Admitting: *Deleted

## 2017-06-09 ENCOUNTER — Ambulatory Visit: Payer: Self-pay | Admitting: *Deleted

## 2017-06-09 DIAGNOSIS — Z09 Encounter for follow-up examination after completed treatment for conditions other than malignant neoplasm: Secondary | ICD-10-CM | POA: Diagnosis not present

## 2017-06-09 DIAGNOSIS — I10 Essential (primary) hypertension: Secondary | ICD-10-CM | POA: Diagnosis not present

## 2017-06-09 NOTE — Patient Outreach (Signed)
[facility]) Care Management  06/09/2017  Allison Hill 04/15/1971 [id number]  Transition of Care Referral  Referral Date: 06/03/17 Referral Source: [employer] Date of Discharge: 05/29/17 [facility] Discharge Diagnosis: N/A Insurance: [employer]  Outreach attempt # 3 to patient. Patient stated, [facility] She requested a phone call back at a later time.  Plan: RN CM will send unsuccessful outreach letter to patient and will close case if no response from patient within 10 business days.  Wynelle ClevelandJuanita Treyvin Glidden, RN, BSN, MHA/MSL, [facility] Telephonic Care Manager Coordinator [facility] Direct Phone: [phone] Cell Phone: [phone] Toll Free: [phone] Fax: [phone]

## 2017-06-15 DIAGNOSIS — N183 Chronic kidney disease, stage 3 (moderate): Secondary | ICD-10-CM | POA: Diagnosis not present

## 2017-06-15 DIAGNOSIS — N390 Urinary tract infection, site not specified: Secondary | ICD-10-CM | POA: Diagnosis not present

## 2017-06-15 DIAGNOSIS — I10 Essential (primary) hypertension: Secondary | ICD-10-CM | POA: Diagnosis not present

## 2017-06-15 DIAGNOSIS — E559 Vitamin D deficiency, unspecified: Secondary | ICD-10-CM | POA: Diagnosis not present

## 2017-06-23 ENCOUNTER — Encounter: Payer: Self-pay | Admitting: *Deleted

## 2017-06-23 ENCOUNTER — Other Ambulatory Visit: Payer: Self-pay | Admitting: *Deleted

## 2017-06-23 ENCOUNTER — Ambulatory Visit: Payer: Self-pay | Admitting: *Deleted

## 2017-06-23 NOTE — Patient Outreach (Signed)
[facility]) Care Management  06/23/2017  Allison Hill 24-Dec-1970 [id number]  Case Closure  RN CM sent unsuccessful outreach letter to patient on 06/23/17. RN CM will close case due to no response from patient within 10 business days.  Plan:  RN CM will notify [facility] CM Assistantregarding case closure.  RN CM will send patient case closure letter.    Wynelle ClevelandJuanita Aime Meloche, RN, BSN, MHA/MSL, [facility] Telephonic Care Manager Coordinator [facility] Direct Phone: [phone] Cell Phone: [phone] Toll Free: [phone] Fax: [phone]

## 2017-07-30 DIAGNOSIS — R1110 Vomiting, unspecified: Secondary | ICD-10-CM | POA: Diagnosis not present

## 2017-07-30 DIAGNOSIS — R9431 Abnormal electrocardiogram [ECG] [EKG]: Secondary | ICD-10-CM | POA: Diagnosis not present

## 2017-07-30 DIAGNOSIS — I1310 Hypertensive heart and chronic kidney disease without heart failure, with stage 1 through stage 4 chronic kidney disease, or unspecified chronic kidney disease: Secondary | ICD-10-CM | POA: Diagnosis not present

## 2017-07-30 DIAGNOSIS — I517 Cardiomegaly: Secondary | ICD-10-CM | POA: Diagnosis not present

## 2017-07-30 DIAGNOSIS — N179 Acute kidney failure, unspecified: Secondary | ICD-10-CM | POA: Diagnosis not present

## 2017-07-30 DIAGNOSIS — K219 Gastro-esophageal reflux disease without esophagitis: Secondary | ICD-10-CM | POA: Diagnosis not present

## 2017-07-30 DIAGNOSIS — E860 Dehydration: Secondary | ICD-10-CM | POA: Diagnosis not present

## 2017-07-30 DIAGNOSIS — R748 Abnormal levels of other serum enzymes: Secondary | ICD-10-CM | POA: Diagnosis not present

## 2017-07-30 DIAGNOSIS — Z8249 Family history of ischemic heart disease and other diseases of the circulatory system: Secondary | ICD-10-CM | POA: Diagnosis not present

## 2017-07-30 DIAGNOSIS — Z7982 Long term (current) use of aspirin: Secondary | ICD-10-CM | POA: Diagnosis not present

## 2017-07-30 DIAGNOSIS — E876 Hypokalemia: Secondary | ICD-10-CM | POA: Diagnosis not present

## 2017-07-30 DIAGNOSIS — I082 Rheumatic disorders of both aortic and tricuspid valves: Secondary | ICD-10-CM | POA: Diagnosis not present

## 2017-07-30 DIAGNOSIS — F1020 Alcohol dependence, uncomplicated: Secondary | ICD-10-CM | POA: Diagnosis not present

## 2017-07-30 DIAGNOSIS — N189 Chronic kidney disease, unspecified: Secondary | ICD-10-CM | POA: Diagnosis not present

## 2017-07-30 DIAGNOSIS — H1089 Other conjunctivitis: Secondary | ICD-10-CM | POA: Diagnosis not present

## 2017-07-30 DIAGNOSIS — R197 Diarrhea, unspecified: Secondary | ICD-10-CM | POA: Diagnosis not present

## 2017-07-30 DIAGNOSIS — F1910 Other psychoactive substance abuse, uncomplicated: Secondary | ICD-10-CM | POA: Diagnosis not present

## 2017-07-30 DIAGNOSIS — F17210 Nicotine dependence, cigarettes, uncomplicated: Secondary | ICD-10-CM | POA: Diagnosis not present

## 2017-07-30 DIAGNOSIS — N2581 Secondary hyperparathyroidism of renal origin: Secondary | ICD-10-CM | POA: Diagnosis not present

## 2017-07-30 DIAGNOSIS — N183 Chronic kidney disease, stage 3 (moderate): Secondary | ICD-10-CM | POA: Diagnosis not present

## 2017-07-30 DIAGNOSIS — I129 Hypertensive chronic kidney disease with stage 1 through stage 4 chronic kidney disease, or unspecified chronic kidney disease: Secondary | ICD-10-CM | POA: Diagnosis not present

## 2017-07-30 DIAGNOSIS — I10 Essential (primary) hypertension: Secondary | ICD-10-CM | POA: Diagnosis not present

## 2017-07-30 DIAGNOSIS — I161 Hypertensive emergency: Secondary | ICD-10-CM | POA: Diagnosis not present

## 2017-07-30 DIAGNOSIS — R7989 Other specified abnormal findings of blood chemistry: Secondary | ICD-10-CM | POA: Diagnosis not present

## 2017-07-30 DIAGNOSIS — R42 Dizziness and giddiness: Secondary | ICD-10-CM | POA: Diagnosis not present

## 2017-07-30 DIAGNOSIS — E872 Acidosis: Secondary | ICD-10-CM | POA: Diagnosis not present

## 2017-07-30 DIAGNOSIS — R5383 Other fatigue: Secondary | ICD-10-CM | POA: Diagnosis not present

## 2017-07-31 DIAGNOSIS — F17210 Nicotine dependence, cigarettes, uncomplicated: Secondary | ICD-10-CM | POA: Diagnosis not present

## 2017-07-31 DIAGNOSIS — Z8249 Family history of ischemic heart disease and other diseases of the circulatory system: Secondary | ICD-10-CM | POA: Diagnosis not present

## 2017-07-31 DIAGNOSIS — E876 Hypokalemia: Secondary | ICD-10-CM | POA: Diagnosis not present

## 2017-07-31 DIAGNOSIS — N2581 Secondary hyperparathyroidism of renal origin: Secondary | ICD-10-CM | POA: Diagnosis not present

## 2017-07-31 DIAGNOSIS — E860 Dehydration: Secondary | ICD-10-CM | POA: Diagnosis not present

## 2017-07-31 DIAGNOSIS — I129 Hypertensive chronic kidney disease with stage 1 through stage 4 chronic kidney disease, or unspecified chronic kidney disease: Secondary | ICD-10-CM | POA: Diagnosis not present

## 2017-07-31 DIAGNOSIS — N183 Chronic kidney disease, stage 3 (moderate): Secondary | ICD-10-CM | POA: Diagnosis not present

## 2017-07-31 DIAGNOSIS — N179 Acute kidney failure, unspecified: Secondary | ICD-10-CM | POA: Diagnosis not present

## 2017-08-17 DIAGNOSIS — Z09 Encounter for follow-up examination after completed treatment for conditions other than malignant neoplasm: Secondary | ICD-10-CM | POA: Diagnosis not present

## 2017-08-17 DIAGNOSIS — N183 Chronic kidney disease, stage 3 (moderate): Secondary | ICD-10-CM | POA: Diagnosis not present

## 2017-08-17 DIAGNOSIS — J40 Bronchitis, not specified as acute or chronic: Secondary | ICD-10-CM | POA: Diagnosis not present

## 2017-08-17 DIAGNOSIS — I129 Hypertensive chronic kidney disease with stage 1 through stage 4 chronic kidney disease, or unspecified chronic kidney disease: Secondary | ICD-10-CM | POA: Diagnosis not present

## 2017-08-18 DIAGNOSIS — I10 Essential (primary) hypertension: Secondary | ICD-10-CM | POA: Diagnosis not present

## 2017-09-10 DIAGNOSIS — I517 Cardiomegaly: Secondary | ICD-10-CM | POA: Diagnosis not present

## 2017-09-10 DIAGNOSIS — Z3202 Encounter for pregnancy test, result negative: Secondary | ICD-10-CM | POA: Diagnosis not present

## 2017-09-10 DIAGNOSIS — Z5320 Procedure and treatment not carried out because of patient's decision for unspecified reasons: Secondary | ICD-10-CM | POA: Diagnosis not present

## 2017-09-10 DIAGNOSIS — R5383 Other fatigue: Secondary | ICD-10-CM | POA: Diagnosis not present

## 2017-09-14 DIAGNOSIS — I517 Cardiomegaly: Secondary | ICD-10-CM | POA: Diagnosis not present

## 2017-09-14 DIAGNOSIS — Z5321 Procedure and treatment not carried out due to patient leaving prior to being seen by health care provider: Secondary | ICD-10-CM | POA: Diagnosis not present

## 2017-09-14 DIAGNOSIS — R531 Weakness: Secondary | ICD-10-CM | POA: Diagnosis not present

## 2017-09-14 DIAGNOSIS — R079 Chest pain, unspecified: Secondary | ICD-10-CM | POA: Diagnosis not present

## 2017-09-14 DIAGNOSIS — R404 Transient alteration of awareness: Secondary | ICD-10-CM | POA: Diagnosis not present

## 2017-09-14 DIAGNOSIS — R110 Nausea: Secondary | ICD-10-CM | POA: Diagnosis not present

## 2017-10-09 DIAGNOSIS — N189 Chronic kidney disease, unspecified: Secondary | ICD-10-CM | POA: Diagnosis not present

## 2017-10-09 DIAGNOSIS — E213 Hyperparathyroidism, unspecified: Secondary | ICD-10-CM | POA: Diagnosis not present

## 2017-10-09 DIAGNOSIS — I129 Hypertensive chronic kidney disease with stage 1 through stage 4 chronic kidney disease, or unspecified chronic kidney disease: Secondary | ICD-10-CM | POA: Diagnosis not present

## 2017-10-09 DIAGNOSIS — R404 Transient alteration of awareness: Secondary | ICD-10-CM | POA: Diagnosis not present

## 2017-10-09 DIAGNOSIS — E785 Hyperlipidemia, unspecified: Secondary | ICD-10-CM | POA: Diagnosis not present

## 2017-10-09 DIAGNOSIS — R748 Abnormal levels of other serum enzymes: Secondary | ICD-10-CM | POA: Diagnosis not present

## 2017-10-09 DIAGNOSIS — N2581 Secondary hyperparathyroidism of renal origin: Secondary | ICD-10-CM | POA: Diagnosis not present

## 2017-10-09 DIAGNOSIS — I4581 Long QT syndrome: Secondary | ICD-10-CM | POA: Diagnosis not present

## 2017-10-09 DIAGNOSIS — R55 Syncope and collapse: Secondary | ICD-10-CM | POA: Diagnosis not present

## 2017-10-09 DIAGNOSIS — F1420 Cocaine dependence, uncomplicated: Secondary | ICD-10-CM | POA: Diagnosis not present

## 2017-10-09 DIAGNOSIS — F1910 Other psychoactive substance abuse, uncomplicated: Secondary | ICD-10-CM | POA: Diagnosis not present

## 2017-10-09 DIAGNOSIS — F1410 Cocaine abuse, uncomplicated: Secondary | ICD-10-CM | POA: Diagnosis not present

## 2017-10-09 DIAGNOSIS — Z3202 Encounter for pregnancy test, result negative: Secondary | ICD-10-CM | POA: Diagnosis not present

## 2017-10-09 DIAGNOSIS — F329 Major depressive disorder, single episode, unspecified: Secondary | ICD-10-CM | POA: Diagnosis not present

## 2017-10-09 DIAGNOSIS — D573 Sickle-cell trait: Secondary | ICD-10-CM | POA: Diagnosis not present

## 2017-10-09 DIAGNOSIS — R079 Chest pain, unspecified: Secondary | ICD-10-CM | POA: Diagnosis not present

## 2017-10-09 DIAGNOSIS — R0789 Other chest pain: Secondary | ICD-10-CM | POA: Diagnosis not present

## 2017-10-09 DIAGNOSIS — E876 Hypokalemia: Secondary | ICD-10-CM | POA: Diagnosis not present

## 2017-10-09 DIAGNOSIS — F17210 Nicotine dependence, cigarettes, uncomplicated: Secondary | ICD-10-CM | POA: Diagnosis not present

## 2017-10-09 DIAGNOSIS — K219 Gastro-esophageal reflux disease without esophagitis: Secondary | ICD-10-CM | POA: Diagnosis not present

## 2017-10-09 DIAGNOSIS — F1020 Alcohol dependence, uncomplicated: Secondary | ICD-10-CM | POA: Diagnosis not present

## 2017-10-09 DIAGNOSIS — R072 Precordial pain: Secondary | ICD-10-CM | POA: Diagnosis not present

## 2017-10-09 DIAGNOSIS — F1210 Cannabis abuse, uncomplicated: Secondary | ICD-10-CM | POA: Diagnosis not present

## 2017-10-09 DIAGNOSIS — R531 Weakness: Secondary | ICD-10-CM | POA: Diagnosis not present

## 2017-10-09 DIAGNOSIS — I517 Cardiomegaly: Secondary | ICD-10-CM | POA: Diagnosis not present

## 2017-10-09 DIAGNOSIS — N183 Chronic kidney disease, stage 3 (moderate): Secondary | ICD-10-CM | POA: Diagnosis not present

## 2017-10-10 DIAGNOSIS — I129 Hypertensive chronic kidney disease with stage 1 through stage 4 chronic kidney disease, or unspecified chronic kidney disease: Secondary | ICD-10-CM | POA: Diagnosis not present

## 2017-10-10 DIAGNOSIS — N2581 Secondary hyperparathyroidism of renal origin: Secondary | ICD-10-CM | POA: Diagnosis not present

## 2017-10-10 DIAGNOSIS — R748 Abnormal levels of other serum enzymes: Secondary | ICD-10-CM | POA: Diagnosis not present

## 2017-10-10 DIAGNOSIS — N183 Chronic kidney disease, stage 3 (moderate): Secondary | ICD-10-CM | POA: Diagnosis not present

## 2017-10-10 DIAGNOSIS — K219 Gastro-esophageal reflux disease without esophagitis: Secondary | ICD-10-CM | POA: Diagnosis not present

## 2017-10-10 DIAGNOSIS — R079 Chest pain, unspecified: Secondary | ICD-10-CM | POA: Diagnosis not present

## 2017-10-10 DIAGNOSIS — E876 Hypokalemia: Secondary | ICD-10-CM | POA: Diagnosis not present

## 2017-10-10 DIAGNOSIS — F1220 Cannabis dependence, uncomplicated: Secondary | ICD-10-CM | POA: Diagnosis not present

## 2017-10-10 DIAGNOSIS — F1490 Cocaine use, unspecified, uncomplicated: Secondary | ICD-10-CM | POA: Diagnosis not present

## 2017-10-16 DIAGNOSIS — N184 Chronic kidney disease, stage 4 (severe): Secondary | ICD-10-CM | POA: Diagnosis not present

## 2017-10-16 DIAGNOSIS — Z7982 Long term (current) use of aspirin: Secondary | ICD-10-CM | POA: Diagnosis not present

## 2017-10-16 DIAGNOSIS — R7989 Other specified abnormal findings of blood chemistry: Secondary | ICD-10-CM | POA: Diagnosis not present

## 2017-10-16 DIAGNOSIS — R0789 Other chest pain: Secondary | ICD-10-CM | POA: Diagnosis not present

## 2017-10-16 DIAGNOSIS — N189 Chronic kidney disease, unspecified: Secondary | ICD-10-CM | POA: Diagnosis not present

## 2017-10-16 DIAGNOSIS — F1210 Cannabis abuse, uncomplicated: Secondary | ICD-10-CM | POA: Diagnosis not present

## 2017-10-16 DIAGNOSIS — I160 Hypertensive urgency: Secondary | ICD-10-CM | POA: Diagnosis not present

## 2017-10-16 DIAGNOSIS — I10 Essential (primary) hypertension: Secondary | ICD-10-CM | POA: Diagnosis not present

## 2017-10-16 DIAGNOSIS — R002 Palpitations: Secondary | ICD-10-CM | POA: Diagnosis not present

## 2017-10-16 DIAGNOSIS — N2581 Secondary hyperparathyroidism of renal origin: Secondary | ICD-10-CM | POA: Diagnosis not present

## 2017-10-16 DIAGNOSIS — N183 Chronic kidney disease, stage 3 (moderate): Secondary | ICD-10-CM | POA: Diagnosis not present

## 2017-10-16 DIAGNOSIS — Z3202 Encounter for pregnancy test, result negative: Secondary | ICD-10-CM | POA: Diagnosis not present

## 2017-10-16 DIAGNOSIS — F1420 Cocaine dependence, uncomplicated: Secondary | ICD-10-CM | POA: Diagnosis not present

## 2017-10-16 DIAGNOSIS — D573 Sickle-cell trait: Secondary | ICD-10-CM | POA: Diagnosis not present

## 2017-10-16 DIAGNOSIS — R5383 Other fatigue: Secondary | ICD-10-CM | POA: Diagnosis not present

## 2017-10-16 DIAGNOSIS — F1220 Cannabis dependence, uncomplicated: Secondary | ICD-10-CM | POA: Diagnosis not present

## 2017-10-16 DIAGNOSIS — I517 Cardiomegaly: Secondary | ICD-10-CM | POA: Diagnosis not present

## 2017-10-16 DIAGNOSIS — D571 Sickle-cell disease without crisis: Secondary | ICD-10-CM | POA: Diagnosis not present

## 2017-10-16 DIAGNOSIS — R0602 Shortness of breath: Secondary | ICD-10-CM | POA: Diagnosis not present

## 2017-10-16 DIAGNOSIS — R072 Precordial pain: Secondary | ICD-10-CM | POA: Diagnosis not present

## 2017-10-16 DIAGNOSIS — F17210 Nicotine dependence, cigarettes, uncomplicated: Secondary | ICD-10-CM | POA: Diagnosis not present

## 2017-10-16 DIAGNOSIS — K219 Gastro-esophageal reflux disease without esophagitis: Secondary | ICD-10-CM | POA: Diagnosis not present

## 2017-10-16 DIAGNOSIS — F1410 Cocaine abuse, uncomplicated: Secondary | ICD-10-CM | POA: Diagnosis not present

## 2017-10-16 DIAGNOSIS — I129 Hypertensive chronic kidney disease with stage 1 through stage 4 chronic kidney disease, or unspecified chronic kidney disease: Secondary | ICD-10-CM | POA: Diagnosis not present

## 2017-10-16 DIAGNOSIS — F1910 Other psychoactive substance abuse, uncomplicated: Secondary | ICD-10-CM | POA: Diagnosis not present

## 2017-10-16 DIAGNOSIS — R531 Weakness: Secondary | ICD-10-CM | POA: Diagnosis not present

## 2017-10-17 DIAGNOSIS — F1210 Cannabis abuse, uncomplicated: Secondary | ICD-10-CM | POA: Diagnosis not present

## 2017-10-17 DIAGNOSIS — K219 Gastro-esophageal reflux disease without esophagitis: Secondary | ICD-10-CM | POA: Diagnosis not present

## 2017-10-17 DIAGNOSIS — I129 Hypertensive chronic kidney disease with stage 1 through stage 4 chronic kidney disease, or unspecified chronic kidney disease: Secondary | ICD-10-CM | POA: Diagnosis not present

## 2017-10-17 DIAGNOSIS — I160 Hypertensive urgency: Secondary | ICD-10-CM | POA: Diagnosis not present

## 2017-10-17 DIAGNOSIS — D571 Sickle-cell disease without crisis: Secondary | ICD-10-CM | POA: Diagnosis not present

## 2017-10-17 DIAGNOSIS — F1410 Cocaine abuse, uncomplicated: Secondary | ICD-10-CM | POA: Diagnosis not present

## 2017-10-17 DIAGNOSIS — R072 Precordial pain: Secondary | ICD-10-CM | POA: Diagnosis not present

## 2017-10-17 DIAGNOSIS — N2581 Secondary hyperparathyroidism of renal origin: Secondary | ICD-10-CM | POA: Diagnosis not present

## 2017-10-17 DIAGNOSIS — N183 Chronic kidney disease, stage 3 (moderate): Secondary | ICD-10-CM | POA: Diagnosis not present

## 2017-10-18 DIAGNOSIS — D571 Sickle-cell disease without crisis: Secondary | ICD-10-CM | POA: Diagnosis not present

## 2017-10-18 DIAGNOSIS — I129 Hypertensive chronic kidney disease with stage 1 through stage 4 chronic kidney disease, or unspecified chronic kidney disease: Secondary | ICD-10-CM | POA: Diagnosis not present

## 2017-10-18 DIAGNOSIS — I160 Hypertensive urgency: Secondary | ICD-10-CM | POA: Diagnosis not present

## 2017-10-18 DIAGNOSIS — R072 Precordial pain: Secondary | ICD-10-CM | POA: Diagnosis not present

## 2017-10-18 DIAGNOSIS — F1210 Cannabis abuse, uncomplicated: Secondary | ICD-10-CM | POA: Diagnosis not present

## 2017-10-18 DIAGNOSIS — F1410 Cocaine abuse, uncomplicated: Secondary | ICD-10-CM | POA: Diagnosis not present

## 2017-10-18 DIAGNOSIS — K219 Gastro-esophageal reflux disease without esophagitis: Secondary | ICD-10-CM | POA: Diagnosis not present

## 2017-10-18 DIAGNOSIS — N183 Chronic kidney disease, stage 3 (moderate): Secondary | ICD-10-CM | POA: Diagnosis not present

## 2017-10-18 DIAGNOSIS — N2581 Secondary hyperparathyroidism of renal origin: Secondary | ICD-10-CM | POA: Diagnosis not present

## 2017-11-27 DIAGNOSIS — N186 End stage renal disease: Secondary | ICD-10-CM | POA: Diagnosis not present

## 2017-11-27 DIAGNOSIS — F17210 Nicotine dependence, cigarettes, uncomplicated: Secondary | ICD-10-CM | POA: Diagnosis not present

## 2017-11-27 DIAGNOSIS — Z113 Encounter for screening for infections with a predominantly sexual mode of transmission: Secondary | ICD-10-CM | POA: Diagnosis not present

## 2017-11-27 DIAGNOSIS — I120 Hypertensive chronic kidney disease with stage 5 chronic kidney disease or end stage renal disease: Secondary | ICD-10-CM | POA: Diagnosis not present

## 2017-11-27 DIAGNOSIS — K649 Unspecified hemorrhoids: Secondary | ICD-10-CM | POA: Diagnosis not present

## 2017-12-01 DIAGNOSIS — R072 Precordial pain: Secondary | ICD-10-CM | POA: Diagnosis not present

## 2017-12-01 DIAGNOSIS — R9431 Abnormal electrocardiogram [ECG] [EKG]: Secondary | ICD-10-CM | POA: Diagnosis not present

## 2017-12-01 DIAGNOSIS — I517 Cardiomegaly: Secondary | ICD-10-CM | POA: Diagnosis not present

## 2017-12-01 DIAGNOSIS — Z3202 Encounter for pregnancy test, result negative: Secondary | ICD-10-CM | POA: Diagnosis not present

## 2017-12-01 DIAGNOSIS — R0602 Shortness of breath: Secondary | ICD-10-CM | POA: Diagnosis not present

## 2017-12-01 DIAGNOSIS — R079 Chest pain, unspecified: Secondary | ICD-10-CM | POA: Diagnosis not present

## 2017-12-20 DIAGNOSIS — N183 Chronic kidney disease, stage 3 (moderate): Secondary | ICD-10-CM | POA: Diagnosis not present

## 2017-12-20 DIAGNOSIS — F1410 Cocaine abuse, uncomplicated: Secondary | ICD-10-CM | POA: Diagnosis not present

## 2017-12-20 DIAGNOSIS — R000 Tachycardia, unspecified: Secondary | ICD-10-CM | POA: Diagnosis not present

## 2017-12-20 DIAGNOSIS — R0600 Dyspnea, unspecified: Secondary | ICD-10-CM | POA: Diagnosis not present

## 2017-12-20 DIAGNOSIS — Z79899 Other long term (current) drug therapy: Secondary | ICD-10-CM | POA: Diagnosis not present

## 2017-12-20 DIAGNOSIS — I129 Hypertensive chronic kidney disease with stage 1 through stage 4 chronic kidney disease, or unspecified chronic kidney disease: Secondary | ICD-10-CM | POA: Diagnosis not present

## 2017-12-20 DIAGNOSIS — F1210 Cannabis abuse, uncomplicated: Secondary | ICD-10-CM | POA: Diagnosis not present

## 2017-12-20 DIAGNOSIS — R079 Chest pain, unspecified: Secondary | ICD-10-CM | POA: Diagnosis not present

## 2017-12-20 DIAGNOSIS — R002 Palpitations: Secondary | ICD-10-CM | POA: Diagnosis not present

## 2017-12-20 DIAGNOSIS — I150 Renovascular hypertension: Secondary | ICD-10-CM | POA: Diagnosis not present

## 2017-12-20 DIAGNOSIS — Z7151 Drug abuse counseling and surveillance of drug abuser: Secondary | ICD-10-CM | POA: Diagnosis not present

## 2017-12-20 DIAGNOSIS — R0789 Other chest pain: Secondary | ICD-10-CM | POA: Diagnosis not present

## 2017-12-20 DIAGNOSIS — F17210 Nicotine dependence, cigarettes, uncomplicated: Secondary | ICD-10-CM | POA: Diagnosis not present

## 2017-12-20 DIAGNOSIS — K219 Gastro-esophageal reflux disease without esophagitis: Secondary | ICD-10-CM | POA: Diagnosis not present

## 2017-12-20 DIAGNOSIS — I120 Hypertensive chronic kidney disease with stage 5 chronic kidney disease or end stage renal disease: Secondary | ICD-10-CM | POA: Diagnosis not present

## 2017-12-20 DIAGNOSIS — F1290 Cannabis use, unspecified, uncomplicated: Secondary | ICD-10-CM | POA: Diagnosis not present

## 2017-12-20 DIAGNOSIS — R0602 Shortness of breath: Secondary | ICD-10-CM | POA: Diagnosis not present

## 2017-12-21 DIAGNOSIS — F17200 Nicotine dependence, unspecified, uncomplicated: Secondary | ICD-10-CM | POA: Diagnosis not present

## 2017-12-21 DIAGNOSIS — F1910 Other psychoactive substance abuse, uncomplicated: Secondary | ICD-10-CM | POA: Diagnosis not present

## 2017-12-21 DIAGNOSIS — R9431 Abnormal electrocardiogram [ECG] [EKG]: Secondary | ICD-10-CM | POA: Diagnosis not present

## 2017-12-21 DIAGNOSIS — I129 Hypertensive chronic kidney disease with stage 1 through stage 4 chronic kidney disease, or unspecified chronic kidney disease: Secondary | ICD-10-CM | POA: Diagnosis not present

## 2017-12-21 DIAGNOSIS — K219 Gastro-esophageal reflux disease without esophagitis: Secondary | ICD-10-CM | POA: Diagnosis not present

## 2017-12-21 DIAGNOSIS — I517 Cardiomegaly: Secondary | ICD-10-CM | POA: Diagnosis not present

## 2017-12-21 DIAGNOSIS — N390 Urinary tract infection, site not specified: Secondary | ICD-10-CM | POA: Diagnosis not present

## 2017-12-21 DIAGNOSIS — N183 Chronic kidney disease, stage 3 (moderate): Secondary | ICD-10-CM | POA: Diagnosis not present

## 2017-12-21 DIAGNOSIS — F1410 Cocaine abuse, uncomplicated: Secondary | ICD-10-CM | POA: Diagnosis not present

## 2019-10-06 DIAGNOSIS — F1090 Alcohol use, unspecified, uncomplicated: Secondary | ICD-10-CM | POA: Insufficient documentation

## 2021-09-12 ENCOUNTER — Telehealth: Payer: Self-pay

## 2021-09-12 NOTE — Telephone Encounter (Signed)
Attempted to contact patient and patient's EC Alona Bene to schedule a Palliative Care consult appointment. Both contact numbers are invalid.  ?

## 2021-09-18 ENCOUNTER — Telehealth: Payer: Self-pay

## 2021-09-18 NOTE — Telephone Encounter (Signed)
Attempted to contact patient and patient's EC to schedule a Palliative Care consult appointment. Both numbers listed are invalid. No PCP listed in Epic. Referral canceled unable to schedule consult.  ? ?

## 2022-12-01 DIAGNOSIS — I629 Nontraumatic intracranial hemorrhage, unspecified: Secondary | ICD-10-CM | POA: Insufficient documentation

## 2022-12-06 DIAGNOSIS — F1490 Cocaine use, unspecified, uncomplicated: Secondary | ICD-10-CM | POA: Insufficient documentation

## 2023-08-09 ENCOUNTER — Observation Stay
Admission: EM | Admit: 2023-08-09 | Discharge: 2023-09-23 | Disposition: A | Discharge status: Home / Self Care | Attending: Hospitalist | Admitting: Hospitalist

## 2023-08-09 ENCOUNTER — Emergency Department

## 2023-08-09 ENCOUNTER — Other Ambulatory Visit: Payer: Self-pay

## 2023-08-09 ENCOUNTER — Encounter: Payer: Self-pay | Admitting: Family Medicine

## 2023-08-09 DIAGNOSIS — R079 Chest pain, unspecified: Secondary | ICD-10-CM | POA: Diagnosis present

## 2023-08-09 DIAGNOSIS — I160 Hypertensive urgency: Secondary | ICD-10-CM | POA: Diagnosis present

## 2023-08-09 DIAGNOSIS — N186 End stage renal disease: Secondary | ICD-10-CM

## 2023-08-09 DIAGNOSIS — Z1152 Encounter for screening for COVID-19: Secondary | ICD-10-CM | POA: Diagnosis not present

## 2023-08-09 DIAGNOSIS — Z992 Dependence on renal dialysis: Secondary | ICD-10-CM | POA: Diagnosis not present

## 2023-08-09 DIAGNOSIS — I161 Hypertensive emergency: Secondary | ICD-10-CM | POA: Insufficient documentation

## 2023-08-09 DIAGNOSIS — E663 Overweight: Secondary | ICD-10-CM | POA: Diagnosis not present

## 2023-08-09 DIAGNOSIS — X58XXXA Exposure to other specified factors, initial encounter: Secondary | ICD-10-CM | POA: Insufficient documentation

## 2023-08-09 DIAGNOSIS — Z6827 Body mass index (BMI) 27.0-27.9, adult: Secondary | ICD-10-CM | POA: Insufficient documentation

## 2023-08-09 DIAGNOSIS — H6690 Otitis media, unspecified, unspecified ear: Secondary | ICD-10-CM | POA: Diagnosis not present

## 2023-08-09 DIAGNOSIS — H6691 Otitis media, unspecified, right ear: Secondary | ICD-10-CM | POA: Diagnosis present

## 2023-08-09 DIAGNOSIS — Z79899 Other long term (current) drug therapy: Secondary | ICD-10-CM | POA: Insufficient documentation

## 2023-08-09 DIAGNOSIS — D509 Iron deficiency anemia, unspecified: Secondary | ICD-10-CM | POA: Diagnosis not present

## 2023-08-09 DIAGNOSIS — I5032 Chronic diastolic (congestive) heart failure: Secondary | ICD-10-CM | POA: Diagnosis not present

## 2023-08-09 DIAGNOSIS — I2089 Other forms of angina pectoris: Secondary | ICD-10-CM | POA: Diagnosis not present

## 2023-08-09 DIAGNOSIS — I69354 Hemiplegia and hemiparesis following cerebral infarction affecting left non-dominant side: Secondary | ICD-10-CM | POA: Diagnosis not present

## 2023-08-09 DIAGNOSIS — R0602 Shortness of breath: Secondary | ICD-10-CM | POA: Diagnosis present

## 2023-08-09 DIAGNOSIS — H6090 Unspecified otitis externa, unspecified ear: Secondary | ICD-10-CM | POA: Insufficient documentation

## 2023-08-09 DIAGNOSIS — Z7982 Long term (current) use of aspirin: Secondary | ICD-10-CM | POA: Insufficient documentation

## 2023-08-09 DIAGNOSIS — D631 Anemia in chronic kidney disease: Secondary | ICD-10-CM | POA: Insufficient documentation

## 2023-08-09 DIAGNOSIS — Z8673 Personal history of transient ischemic attack (TIA), and cerebral infarction without residual deficits: Secondary | ICD-10-CM | POA: Insufficient documentation

## 2023-08-09 DIAGNOSIS — T7401XA Adult neglect or abandonment, confirmed, initial encounter: Secondary | ICD-10-CM | POA: Diagnosis not present

## 2023-08-09 DIAGNOSIS — I132 Hypertensive heart and chronic kidney disease with heart failure and with stage 5 chronic kidney disease, or end stage renal disease: Secondary | ICD-10-CM | POA: Diagnosis not present

## 2023-08-09 DIAGNOSIS — R1011 Right upper quadrant pain: Secondary | ICD-10-CM | POA: Diagnosis not present

## 2023-08-09 DIAGNOSIS — I10 Essential (primary) hypertension: Secondary | ICD-10-CM

## 2023-08-09 HISTORY — DX: Essential (primary) hypertension: I10

## 2023-08-09 HISTORY — DX: Heart failure, unspecified: I50.9

## 2023-08-09 HISTORY — DX: Disorder of kidney and ureter, unspecified: N28.9

## 2023-08-09 LAB — URINE DRUG SCREEN, QUALITATIVE (ARMC ONLY)
Amphetamines, Ur Screen: NOT DETECTED
Barbiturates, Ur Screen: NOT DETECTED
Benzodiazepine, Ur Scrn: NOT DETECTED
Cannabinoid 50 Ng, Ur ~~LOC~~: NOT DETECTED
Cocaine Metabolite,Ur ~~LOC~~: POSITIVE — AB
MDMA (Ecstasy)Ur Screen: NOT DETECTED
Methadone Scn, Ur: NOT DETECTED
Opiate, Ur Screen: NOT DETECTED
Phencyclidine (PCP) Ur S: NOT DETECTED
Tricyclic, Ur Screen: NOT DETECTED

## 2023-08-09 LAB — BASIC METABOLIC PANEL
Anion gap: 15 (ref 5–15)
BUN: 61 mg/dL — ABNORMAL HIGH (ref 6–20)
CO2: 23 mmol/L (ref 22–32)
Calcium: 8.9 mg/dL (ref 8.9–10.3)
Chloride: 102 mmol/L (ref 98–111)
Creatinine, Ser: 5.73 mg/dL — ABNORMAL HIGH (ref 0.44–1.00)
GFR, Estimated: 8 mL/min — ABNORMAL LOW (ref >60)
Glucose, Bld: 85 mg/dL (ref 70–99)
Potassium: 3.6 mmol/L (ref 3.5–5.1)
Sodium: 140 mmol/L (ref 135–145)

## 2023-08-09 LAB — CBC WITH DIFFERENTIAL/PLATELET
Abs Immature Granulocytes: 0 10*3/uL (ref 0.00–0.07)
Basophils Absolute: 0 10*3/uL (ref 0.0–0.1)
Basophils Relative: 1 %
Eosinophils Absolute: 0.1 10*3/uL (ref 0.0–0.5)
Eosinophils Relative: 2 %
HCT: 23.4 % — ABNORMAL LOW (ref 36.0–46.0)
Hemoglobin: 7.5 g/dL — ABNORMAL LOW (ref 12.0–15.0)
Immature Granulocytes: 0 %
Lymphocytes Relative: 27 %
Lymphs Abs: 1.3 10*3/uL (ref 0.7–4.0)
MCH: 27.3 pg (ref 26.0–34.0)
MCHC: 32.1 g/dL (ref 30.0–36.0)
MCV: 85.1 fL (ref 80.0–100.0)
Monocytes Absolute: 0.6 10*3/uL (ref 0.1–1.0)
Monocytes Relative: 12 %
Neutro Abs: 2.9 10*3/uL (ref 1.7–7.7)
Neutrophils Relative %: 58 %
Platelets: 163 10*3/uL (ref 150–400)
RBC: 2.75 MIL/uL — ABNORMAL LOW (ref 3.87–5.11)
RDW: 17.7 % — ABNORMAL HIGH (ref 11.5–15.5)
WBC: 4.9 10*3/uL (ref 4.0–10.5)
nRBC: 0 % (ref 0.0–0.2)

## 2023-08-09 LAB — RETICULOCYTES
Immature Retic Fract: 3.5 % (ref 2.3–15.9)
RBC.: 2.74 MIL/uL — ABNORMAL LOW (ref 3.87–5.11)
Retic Count, Absolute: 32.3 10*3/uL (ref 19.0–186.0)
Retic Ct Pct: 1.2 % (ref 0.4–3.1)

## 2023-08-09 LAB — IRON AND TIBC
Iron: 52 ug/dL (ref 28–170)
Saturation Ratios: 23 % (ref 10.4–31.8)
TIBC: 225 ug/dL — ABNORMAL LOW (ref 250–450)
UIBC: 173 ug/dL

## 2023-08-09 LAB — FERRITIN: Ferritin: 297 ng/mL (ref 11–307)

## 2023-08-09 LAB — ABO/RH: ABO/RH(D): A POS

## 2023-08-09 LAB — TROPONIN I (HIGH SENSITIVITY)
Troponin I (High Sensitivity): 50 ng/L — ABNORMAL HIGH (ref <18)
Troponin I (High Sensitivity): 51 ng/L — ABNORMAL HIGH (ref <18)

## 2023-08-09 LAB — HEMOGLOBIN AND HEMATOCRIT, BLOOD
HCT: 22.8 % — ABNORMAL LOW (ref 36.0–46.0)
Hemoglobin: 7.4 g/dL — ABNORMAL LOW (ref 12.0–15.0)

## 2023-08-09 LAB — PREPARE RBC (CROSSMATCH)

## 2023-08-09 MED ORDER — HYDROXYZINE HCL 50 MG PO TABS
25.0000 mg | ORAL_TABLET | Freq: Three times a day (TID) | ORAL | Status: DC
  Administered 2023-08-09 – 2023-09-23 (×128): 25 mg via ORAL
  Filled 2023-08-09 (×135): qty 1

## 2023-08-09 MED ORDER — HEPARIN SODIUM (PORCINE) 5000 UNIT/ML IJ SOLN
5000.0000 [IU] | Freq: Two times a day (BID) | INTRAMUSCULAR | Status: DC
  Administered 2023-08-10 – 2023-09-23 (×72): 5000 [IU] via SUBCUTANEOUS
  Filled 2023-08-09 (×81): qty 1

## 2023-08-09 MED ORDER — ONDANSETRON HCL 4 MG/2ML IJ SOLN
4.0000 mg | Freq: Four times a day (QID) | INTRAMUSCULAR | Status: DC | PRN
Start: 2023-08-09 — End: 2023-09-23
  Administered 2023-09-20: 4 mg via INTRAVENOUS
  Filled 2023-08-09: qty 2

## 2023-08-09 MED ORDER — ASPIRIN 81 MG PO TBEC
81.0000 mg | DELAYED_RELEASE_TABLET | Freq: Every day | ORAL | Status: DC
  Administered 2023-08-09 – 2023-09-23 (×45): 81 mg via ORAL
  Filled 2023-08-09 (×45): qty 1

## 2023-08-09 MED ORDER — HYDRALAZINE HCL 20 MG/ML IJ SOLN
10.0000 mg | Freq: Once | INTRAMUSCULAR | Status: AC
  Administered 2023-08-09: 10 mg via INTRAVENOUS
  Filled 2023-08-09: qty 1

## 2023-08-09 MED ORDER — CHLORHEXIDINE GLUCONATE CLOTH 2 % EX PADS
6.0000 | MEDICATED_PAD | Freq: Every day | CUTANEOUS | Status: DC
  Administered 2023-08-11 – 2023-09-23 (×42): 6 via TOPICAL
  Filled 2023-08-09: qty 6

## 2023-08-09 MED ORDER — TERAZOSIN HCL 2 MG PO CAPS
2.0000 mg | ORAL_CAPSULE | Freq: Every day | ORAL | Status: DC
Start: 2023-08-09 — End: 2023-08-10
  Administered 2023-08-09: 2 mg via ORAL
  Filled 2023-08-09: qty 1

## 2023-08-09 MED ORDER — AMLODIPINE BESYLATE 10 MG PO TABS
10.0000 mg | ORAL_TABLET | Freq: Every day | ORAL | Status: DC
Start: 2023-08-09 — End: 2023-08-21
  Administered 2023-08-09 – 2023-08-21 (×12): 10 mg via ORAL
  Filled 2023-08-09 (×4): qty 1
  Filled 2023-08-09: qty 2
  Filled 2023-08-09 (×7): qty 1
  Filled 2023-08-09: qty 2

## 2023-08-09 MED ORDER — GABAPENTIN 100 MG PO CAPS
200.0000 mg | ORAL_CAPSULE | Freq: Two times a day (BID) | ORAL | Status: DC
  Administered 2023-08-09 – 2023-09-23 (×89): 200 mg via ORAL
  Filled 2023-08-09 (×89): qty 2

## 2023-08-09 MED ORDER — HYDRALAZINE HCL 50 MG PO TABS
100.0000 mg | ORAL_TABLET | Freq: Three times a day (TID) | ORAL | Status: DC
Start: 2023-08-09 — End: 2023-09-12
  Administered 2023-08-09 – 2023-09-12 (×93): 100 mg via ORAL
  Filled 2023-08-09 (×95): qty 2

## 2023-08-09 MED ORDER — LOSARTAN POTASSIUM 50 MG PO TABS
100.0000 mg | ORAL_TABLET | Freq: Every day | ORAL | Status: DC
  Administered 2023-08-09 – 2023-08-29 (×20): 100 mg via ORAL
  Filled 2023-08-09 (×21): qty 2

## 2023-08-09 MED ORDER — SENNOSIDES 8.6 MG PO TABS
1.0000 | ORAL_TABLET | Freq: Every evening | ORAL | Status: DC | PRN
  Filled 2023-08-09 (×2): qty 1

## 2023-08-09 MED ORDER — ISOSORBIDE MONONITRATE ER 30 MG PO TB24
120.0000 mg | ORAL_TABLET | Freq: Every day | ORAL | Status: DC
Start: 2023-08-09 — End: 2023-09-12
  Administered 2023-08-09 – 2023-09-12 (×34): 120 mg via ORAL
  Filled 2023-08-09: qty 2
  Filled 2023-08-09: qty 4
  Filled 2023-08-09 (×5): qty 2
  Filled 2023-08-09: qty 4
  Filled 2023-08-09: qty 2
  Filled 2023-08-09 (×3): qty 4
  Filled 2023-08-09: qty 2
  Filled 2023-08-09 (×2): qty 4
  Filled 2023-08-09 (×2): qty 2
  Filled 2023-08-09: qty 4
  Filled 2023-08-09: qty 2
  Filled 2023-08-09 (×3): qty 4
  Filled 2023-08-09 (×7): qty 2
  Filled 2023-08-09 (×5): qty 4
  Filled 2023-08-09: qty 2

## 2023-08-09 MED ORDER — LABETALOL HCL 200 MG PO TABS
600.0000 mg | ORAL_TABLET | Freq: Two times a day (BID) | ORAL | Status: DC
  Administered 2023-08-09 – 2023-09-12 (×64): 600 mg via ORAL
  Filled 2023-08-09 (×73): qty 3

## 2023-08-09 MED ORDER — SEVELAMER CARBONATE 800 MG PO TABS
1600.0000 mg | ORAL_TABLET | Freq: Three times a day (TID) | ORAL | Status: DC
  Administered 2023-08-09 – 2023-09-23 (×109): 1600 mg via ORAL
  Filled 2023-08-09 (×123): qty 2

## 2023-08-09 MED ORDER — ACETAMINOPHEN 325 MG PO TABS
650.0000 mg | ORAL_TABLET | ORAL | Status: DC | PRN
  Administered 2023-08-11 – 2023-08-13 (×3): 650 mg via ORAL
  Filled 2023-08-09 (×3): qty 2

## 2023-08-09 MED ORDER — SODIUM CHLORIDE 0.9 % IV SOLN
10.0000 mL/h | Freq: Once | INTRAVENOUS | Status: AC
  Administered 2023-08-09: 10 mL/h via INTRAVENOUS

## 2023-08-09 MED ORDER — ATORVASTATIN CALCIUM 20 MG PO TABS
40.0000 mg | ORAL_TABLET | Freq: Every day | ORAL | Status: DC
  Administered 2023-08-09 – 2023-09-23 (×45): 40 mg via ORAL
  Filled 2023-08-09 (×45): qty 2

## 2023-08-09 NOTE — Progress Notes (Addendum)
 TOC reached out about this patient recently moving [facility] from [facility] without outpatient dialysis clinic arranged.  In chart review, it appears transfer process had been started.  Last dialysis treatment received last Monday.  Will plan for scheduled dialysis tomorrow.  Dialysis coordinator will assist in outpatient clinic search.   Will complete formal consult in AM.

## 2023-08-09 NOTE — ED Notes (Signed)
Pt set up to eat breakfast  

## 2023-08-09 NOTE — H&P (Signed)
 History and Physical    Allison Hill:[id number] DOB: [birth year] DOA: 08/09/2023  PCP: Melvenia Beam, MD (Confirm with patient/family/NH records and if not entered, this has to be entered at [facility] point of entry) Patient coming from: Home  I have personally briefly reviewed patient's old medical records in [facility]  Chief Complaint: Chest pain, feeling better  HPI: Allison Hill is a 53 y.o. female with medical history significant of ESRD on HD MWF, refractory HTN, chronic HFpEF, chronic iron deficiency anemia presented with new onset of chest pain.  Patient woke up last night around 2 AM with severe sharp-like chest pain 8/10 associated with shortness of breath and the chest pain radiated to left-sided neck, constant, no significant exacerbation or relieving factors.  Patient took 1 aspirin and chest pain subsided in 20-30 minutes.  Patient told me that she did not miss any of her dialysis and last HD was Friday of 3 and half hours.  ED Course: Afebrile, nontachycardic blood pressure significantly elevated 180/110, O2 saturation 100% on room air.  Chest x-ray negative for acute infiltrates, blood work showed hemoglobin 7.5 compared to baseline 8.3-9.2, WBC 4.9, creatinine 5.7, BUN 61, troponin trending 50> 50.  EKG showed no acute ST changes.  Patient was given aspirin 325 x 1 and PRBC x 1.  Review of Systems: As per HPI otherwise 14 point review of systems negative.    Past Medical History:  Diagnosis Date   CHF (congestive heart failure) (HCC)    Hypertension    Renal disorder     History reviewed. No pertinent surgical history.   has no history on file for tobacco use, alcohol use, and drug use.  Allergies  Allergen Reactions   Ceftriaxone Shortness Of Breath   Shellfish Allergy Hives   Minoxidil Swelling    History reviewed. No pertinent family history.   Prior to Admission medications   Medication Sig Start Date End Date Taking? Authorizing  Provider  aMILoride (MIDAMOR) 5 MG tablet  06/26/16  Yes [provider]  atorvastatin (LIPITOR) 40 MG tablet Take 40 mg by mouth daily. 07/05/23  Yes [provider]  hydrOXYzine (ATARAX) 25 MG tablet Take 25 mg by mouth 3 (three) times daily. 06/25/23  Yes [provider]  isosorbide mononitrate (IMDUR) 30 MG 24 hr tablet Take 30 mg by mouth daily. 07/05/23  Yes [provider]  medroxyPROGESTERone (PROVERA) 10 MG tablet 1 by mouth daily for 7 days and then repeat in a month 01/05/17  Yes [provider]  nitroGLYCERIN (NITROSTAT) 0.4 MG SL tablet Place under the tongue. 05/29/23  Yes [provider]  polyethylene glycol powder (GLYCOLAX/MIRALAX) 17 GM/SCOOP powder Take by mouth. 03/09/23  Yes [provider]  senna (SENNA-TIME) 8.6 MG tablet Take by mouth. 03/09/23  Yes [provider]  amLODipine (NORVASC) 10 MG tablet Take 10 mg by mouth daily.    [provider]  aspirin EC 81 MG tablet Take by mouth.    [provider]  Cholecalciferol (VITAMIN D-1000 MAX ST) 25 MCG (1000 UT) tablet Take by mouth.    [provider]  gabapentin (NEURONTIN) 100 MG capsule Take 200 mg by mouth 2 (two) times daily.    [provider]  hydrALAZINE (APRESOLINE) 100 MG tablet Take 100 mg by mouth 3 (three) times daily.    [provider]  isosorbide mononitrate (IMDUR) 120 MG 24 hr tablet Take 120 mg by mouth daily.    [provider]  labetalol (NORMODYNE) 200 MG tablet Take 400 mg by mouth 3 (three) times daily.    [provider]  labetalol (NORMODYNE) 300 MG tablet Take 600 mg by mouth 3 (three) times daily.    [provider]  losartan (COZAAR) 100 MG tablet Take 100 mg by mouth daily.    [provider]  sevelamer carbonate (RENVELA) 800 MG tablet Take 1,600 mg by mouth 3 (three) times daily.    [provider]  terazosin (HYTRIN) 2 MG capsule Take 2 mg by  mouth at bedtime.    [provider]    Physical Exam: Vitals:   08/09/23 0530 08/09/23 0600 08/09/23 0730 08/09/23 0829  BP: (!) 168/102 (!) 176/104 (!) 188/121 (!) 171/102  Pulse:    84  Resp: 12 13 10 17   Temp:    98.3 F (36.8 C)  TempSrc:    Oral  SpO2: 98% 99% 100% 100%  Weight:        Constitutional: NAD, calm, comfortable Vitals:   08/09/23 0530 08/09/23 0600 08/09/23 0730 08/09/23 0829  BP: (!) 168/102 (!) 176/104 (!) 188/121 (!) 171/102  Pulse:    84  Resp: 12 13 10 17   Temp:    98.3 F (36.8 C)  TempSrc:    Oral  SpO2: 98% 99% 100% 100%  Weight:       Eyes: PERRL, lids and conjunctivae normal ENMT: Mucous membranes are moist. Posterior pharynx clear of any exudate or lesions.Normal dentition.  Neck: normal, supple, no masses, no thyromegaly Respiratory: clear to auscultation bilaterally, no wheezing, no crackles. Normal respiratory effort. No accessory muscle use.  Cardiovascular: Regular rate and rhythm, no murmurs / rubs / gallops. No extremity edema. 2+ pedal pulses. No carotid bruits.  Abdomen: no tenderness, no masses palpated. No hepatosplenomegaly. Bowel sounds positive.  Musculoskeletal: no clubbing / cyanosis. No joint deformity upper and lower extremities. Good ROM, no contractures. Normal muscle tone.  Skin: no rashes, lesions, ulcers. No induration Neurologic: CN 2-12 grossly intact. Sensation intact, DTR normal. Strength 5/5 in all 4.  Psychiatric: Normal judgment and insight. Alert and oriented x 3. Normal mood.     Labs on Admission: I have personally reviewed following labs and imaging studies  CBC: Recent Labs  Lab 08/09/23 0352  WBC 4.9  NEUTROABS 2.9  HGB 7.5*  HCT 23.4*  MCV 85.1  PLT 163   Basic Metabolic Panel: Recent Labs  Lab 08/09/23 0352  NA 140  K 3.6  CL 102  CO2 23  GLUCOSE 85  BUN 61*  CREATININE 5.73*  CALCIUM 8.9   GFR: CrCl cannot be calculated (Unknown ideal weight.). Liver Function Tests: No  results for input(s): "AST", "ALT", "ALKPHOS", "BILITOT", "PROT", "ALBUMIN" in the last 168 hours. No results for input(s): "LIPASE", "AMYLASE" in the last 168 hours. No results for input(s): "AMMONIA" in the last 168 hours. Coagulation Profile: No results for input(s): "INR", "PROTIME" in the last 168 hours. Cardiac Enzymes: No results for input(s): "CKTOTAL", "CKMB", "CKMBINDEX", "TROPONINI" in the last 168 hours. BNP (last 3 results) No results for input(s): "PROBNP" in the last 8760 hours. HbA1C: No results for input(s): "HGBA1C" in the last 72 hours. CBG: No results for input(s): "GLUCAP" in the last 168 hours. Lipid Profile: No results for input(s): "CHOL", "HDL", "LDLCALC", "TRIG", "CHOLHDL", "LDLDIRECT" in the last 72 hours. Thyroid Function Tests: No results for input(s): "TSH", "T4TOTAL", "FREET4", "T3FREE", "THYROIDAB" in the last 72 hours. Anemia Panel: Recent Labs    08/09/23  0536  FERRITIN 297  TIBC 225*  IRON 52   Urine analysis:    Component Value Date/Time   LABSPEC >=1.030 05/24/2007 1054   PHURINE 5.5 05/24/2007 1054   GLUCOSEU NEGATIVE 05/24/2007 1054   HGBUR NEGATIVE 05/24/2007 1054   BILIRUBINUR NEGATIVE 05/24/2007 1054   KETONESUR NEGATIVE 05/24/2007 1054   PROTEINUR 30 (A) 05/24/2007 1054   UROBILINOGEN 0.2 05/24/2007 1054   NITRITE NEGATIVE 05/24/2007 1054   LEUKOCYTESUR  05/24/2007 1054    NEGATIVE Biochemical Testing Only. Please order routine urinalysis from main lab if confirmatory testing is needed.    Radiological Exams on Admission: DG Chest Portable 1 View Result Date: 08/09/2023 CLINICAL DATA:  Chest pain, dyspnea EXAM: PORTABLE CHEST 1 VIEW COMPARISON:  08/04/2023 FINDINGS: Lungs are clear. No pneumothorax or pleural effusion. Cardiac size is mildly enlarged, unchanged. Right internal jugular hemodialysis catheter is seen with its tip within the right atrium. Pulmonary vascularity is normal. IMPRESSION: 1. Mild cardiomegaly. No active  disease. Electronically Signed   By: Helyn Numbers M.D.   On: 08/09/2023 04:12    EKG: Independently reviewed.  Sinus rhythm, chronic ST changes secondary to HTN, LVH  Assessment/Plan Principal Problem:   Chest pain Active Problems:   HTN (hypertension), malignant  (please populate well all problems here in Problem List. (For example, if patient is on BP meds at home and you resume or decide to hold them, it is a problem that needs to be her. Same for CAD, COPD, HLD and so on)  Anginal-like chest pain -Likely secondary to HTN emergency and acute on chronic HFpEF decompensation -Troponin negative x 2 and EKG showed no acute ST changes.  ACS ruled out.  Noticed that the patient had a normal stress test November 2024. -Echocardiogram was done in November 2024 showed normal LVEF dilated left and right atrium. -Given that the ischemia workup was done recently, no indication for further ischemia workup at this point. -Other DDx, bilateral pulses equal, low suspicion for dissection.  PE risk considered to be low.  Worsening of chronic iron deficiency anemia -PRBC x 1, repeat H&H this morning -Patient has been getting iron infusion in dialysis center -Check iron study and reticulocyte count  HTN emergency -Resume home BP meds including amlodipine, losartan, Imdur and hydralazine  ESRD on HD -Appears to be euvolemic -No significant electrolytes or acid-base abnormalities and BUN and creatinine level close to baseline, no indication for emergency dialysis    DVT prophylaxis: Heparin subcu Code Status: Full code Family Communication: None at bedside Disposition Plan: Expect less than 2 midnight hospital stay Consults called: None Admission status: Telemetry observation   Emeline General MD [facility] Pager [phone]  08/09/2023, 8:32 AM

## 2023-08-09 NOTE — ED Provider Notes (Signed)
 [facility] Provider Note    Event Date/Time   First MD Initiated Contact with Patient 08/09/23 0132     (approximate)   History   Chest Pain (PATIENT C/O LEFT SIDED CHEST PAIN AFTER WAKING UP TO USE THE REST ROOM JUST PTA - PAIN IS 8/10 - STATES SHE MISSED ALL OF LAST WEEKS DIALYSIS DUE TO NO TRANSPORTATION)   HPI  Allison Hill is a 53 y.o. female with history of hypertension, end-stage renal disease on hemodialysis Monday, Wednesday and Friday, cocaine use who presents to the emergency department complaints of left-sided chest pain that he she describes as sharp in nature that radiated into her neck that woke her from sleep.  She had associated shortness of breath and nausea.  Pain has now resolved.  She took 4 aspirin at home.  Told nursing staff that she missed dialysis last week but she told me that she had dialysis on Friday.  She denies any fever but reports she has had a productive cough.  No calf tenderness or calf swelling.  No history of PE or DVT.  Any recent cocaine use.  States she has not taken but 1 dose of her blood pressure medication today and is supposed to be taking multiple medications 3 times a day.   History provided by patient, EMS.    Past Medical History:  Diagnosis Date   CHF (congestive heart failure) (HCC)    Hypertension    Renal disorder     History reviewed. No pertinent surgical history.  MEDICATIONS:  Prior to Admission medications   Medication Sig Start Date End Date Taking? Authorizing Provider  aMILoride (MIDAMOR) 5 MG tablet  06/26/16  Yes [provider]  atorvastatin (LIPITOR) 40 MG tablet Take 40 mg by mouth daily. 07/05/23  Yes [provider]  hydrOXYzine (ATARAX) 25 MG tablet Take 25 mg by mouth 3 (three) times daily. 06/25/23  Yes [provider]  isosorbide mononitrate (IMDUR) 30 MG 24 hr tablet Take 30 mg by mouth daily. 07/05/23  Yes [provider]  medroxyPROGESTERone  (PROVERA) 10 MG tablet 1 by mouth daily for 7 days and then repeat in a month 01/05/17  Yes [provider]  nitroGLYCERIN (NITROSTAT) 0.4 MG SL tablet Place under the tongue. 05/29/23  Yes [provider]  polyethylene glycol powder (GLYCOLAX/MIRALAX) 17 GM/SCOOP powder Take by mouth. 03/09/23  Yes [provider]  senna (SENNA-TIME) 8.6 MG tablet Take by mouth. 03/09/23  Yes [provider]  amLODipine (NORVASC) 10 MG tablet Take 10 mg by mouth daily.    [provider]  aspirin EC 81 MG tablet Take by mouth.    [provider]  Cholecalciferol (VITAMIN D-1000 MAX ST) 25 MCG (1000 UT) tablet Take by mouth.    [provider]  gabapentin (NEURONTIN) 100 MG capsule Take 200 mg by mouth 2 (two) times daily.    [provider]  hydrALAZINE (APRESOLINE) 100 MG tablet Take 100 mg by mouth 3 (three) times daily.    [provider]  isosorbide mononitrate (IMDUR) 120 MG 24 hr tablet Take 120 mg by mouth daily.    [provider]  labetalol (NORMODYNE) 200 MG tablet Take 400 mg by mouth 3 (three) times daily.    [provider]  labetalol (NORMODYNE) 300 MG tablet Take 600 mg by mouth 3 (three) times daily.    [provider]  losartan (COZAAR) 100 MG tablet Take 100 mg by mouth daily.  [provider]  sevelamer carbonate (RENVELA) 800 MG tablet Take 1,600 mg by mouth 3 (three) times daily.    [provider]  terazosin (HYTRIN) 2 MG capsule Take 2 mg by mouth at bedtime.    [provider]    Physical Exam   Triage Vital Signs: ED Triage Vitals  Encounter Vitals Group     BP 08/09/23 0146 (!) 183/111     Systolic BP Percentile --      Diastolic BP Percentile --      Pulse Rate 08/09/23 0146 76     Resp 08/09/23 0146 16     Temp 08/09/23 0146 98.4 F (36.9 C)     Temp src --      SpO2 08/09/23 0146 100 %     Weight 08/09/23 0135 150 lb (68 kg)     Height --       Head Circumference --      Peak Flow --      Pain Score 08/09/23 0135 8     Pain Loc --      Pain Education --      Exclude from Growth Chart --     Most recent vital signs: Vitals:   08/09/23 0400 08/09/23 0430  BP: (!) 178/109 (!) 171/96  Pulse:    Resp: (!) 21 16  Temp:    SpO2: 100% 99%    CONSTITUTIONAL: Alert, responds appropriately to questions.  Chronically ill-appearing HEAD: Normocephalic, atraumatic EYES: Conjunctivae clear, pupils appear equal, sclera nonicteric ENT: normal nose; moist mucous membranes NECK: Supple, normal ROM CARD: RRR; S1 and S2 appreciated, tunneled catheter in the right chest wall RESP: Normal chest excursion without splinting or tachypnea; breath sounds clear and equal bilaterally; no wheezes, no rhonchi, no rales, no hypoxia or respiratory distress, speaking full sentences ABD/GI: Non-distended; soft, non-tender, no rebound, no guarding, no peritoneal signs BACK: The back appears normal EXT: Normal ROM in all joints; no deformity noted, no edema, no calf tenderness or calf swelling SKIN: Normal color for age and race; warm; no rash on exposed skin NEURO: Moves all extremities equally, normal speech PSYCH: The patient's mood and manner are appropriate.   ED Results / Procedures / Treatments   LABS: (all labs ordered are listed, but only abnormal results are displayed) Labs Reviewed  CBC WITH DIFFERENTIAL/PLATELET - Abnormal; Notable for the following components:      Result Value   RBC 2.75 (*)    Hemoglobin 7.5 (*)    HCT 23.4 (*)    RDW 17.7 (*)    All other components within normal limits  BASIC METABOLIC PANEL - Abnormal; Notable for the following components:   BUN 61 (*)    Creatinine, Ser 5.73 (*)    GFR, Estimated 8 (*)    All other components within normal limits  TROPONIN I (HIGH SENSITIVITY) - Abnormal; Notable for the following components:   Troponin I (High Sensitivity) 50 (*)    All other components within normal  limits  URINE DRUG SCREEN, QUALITATIVE ([facility] ONLY)  PREPARE RBC (CROSSMATCH)  TYPE AND SCREEN  TROPONIN I (HIGH SENSITIVITY)     EKG:  EKG Interpretation Date/Time:  Sunday August 09 2023 01:56:40 EDT Ventricular Rate:  81 PR Interval:  192 QRS Duration:  94 QT Interval:  414 QTC Calculation: 481 R Axis:   56  Text Interpretation: Sinus rhythm Consider left atrial enlargement RSR' in V1 or V2, probably normal variant LVH with secondary repolarization abnormality  Confirmed by Rochele Raring [phone]) on 08/09/2023 3:24:45 AM         RADIOLOGY: My personal review and interpretation of imaging: Chest x-ray shows no pulmonary edema.  I have personally reviewed all radiology reports.   DG Chest Portable 1 View Result Date: 08/09/2023 CLINICAL DATA:  Chest pain, dyspnea EXAM: PORTABLE CHEST 1 VIEW COMPARISON:  08/04/2023 FINDINGS: Lungs are clear. No pneumothorax or pleural effusion. Cardiac size is mildly enlarged, unchanged. Right internal jugular hemodialysis catheter is seen with its tip within the right atrium. Pulmonary vascularity is normal. IMPRESSION: 1. Mild cardiomegaly. No active disease. Electronically Signed   By: Helyn Numbers M.D.   On: 08/09/2023 04:12     PROCEDURES:  Critical Care performed: Yes, see critical care procedure note(s)   CRITICAL CARE Performed by: Baxter Hire Brandon Scarbrough   Total critical care time: 35 minutes  Critical care time was exclusive of separately billable procedures and treating other patients.  Critical care was necessary to treat or prevent imminent or life-threatening deterioration.  Critical care was time spent personally by me on the following activities: development of treatment plan with patient and/or surrogate as well as nursing, discussions with consultants, evaluation of patient's response to treatment, examination of patient, obtaining history from patient or surrogate, ordering and performing treatments and interventions, ordering and  review of laboratory studies, ordering and review of radiographic studies, pulse oximetry and re-evaluation of patient's condition.   Marland Kitchen1-3 Lead EKG Interpretation  Performed by: Jhoan Schmieder, Layla Maw, DO Authorized by: Carrson Lightcap, Layla Maw, DO     Interpretation: normal     ECG rate:  76   ECG rate assessment: normal     Rhythm: sinus rhythm     Ectopy: none     Conduction: normal       IMPRESSION / MDM / ASSESSMENT AND PLAN / ED COURSE  I reviewed the triage vital signs and the nursing notes.    Patient here with chest pain, shortness of breath.  Possibly missed dialysis sessions.  Extremely hypertensive with EMS.  Reports missed blood pressure medications.  The patient is on the cardiac monitor to evaluate for evidence of arrhythmia and/or significant heart rate changes.   DIFFERENTIAL DIAGNOSIS (includes but not limited to):   ACS, PE, dissection, volume overload, CHF exacerbation, anemia, electrolyte derangement, hypertensive urgency/emergency   Patient's presentation is most consistent with acute presentation with potential threat to life or bodily function.   PLAN: Will obtain cardiac labs, chest x-ray.  Will give hydralazine for hypertension.  History of substance abuse but she states she has not used cocaine recently.  EKG shows minimal ST elevation in anterior leads but no reciprocal changes, no old for comparison.  Does not meet STEMI criteria.   MEDICATIONS GIVEN IN ED: Medications  0.9 %  sodium chloride infusion (has no administration in time range)  hydrALAZINE (APRESOLINE) injection 10 mg (10 mg Intravenous Given 08/09/23 0359)     ED COURSE: Patient's hemoglobin is 7.5.  Was 8.2 several days ago at [facility]  Given history of heart disease, will transfuse to get over 8.  Likely from her chronic kidney disease.  She denies any bloody stools, melena, vaginal bleeding.  Potassium is normal.  Troponin elevated at 50.  Second is pending.  She has had full dose aspirin and  states she is pain-free currently.  Chest x-ray shows cardiomegaly without edema when reviewed and interpreted by myself and the radiologist.  Blood pressure improved slightly with hydralazine.  Will discuss with  hospitalist for admission for hypertensive urgency, high risk chest pain, anemia in the setting of chronic kidney disease.   CONSULTS:  Consulted and discussed patient's case with hospitalist, Dr. Arville Care.  I have recommended admission and consulting physician agrees and will place admission orders.  Patient (and family if present) agree with this plan.   I reviewed all nursing notes, vitals, pertinent previous records.  All labs, EKGs, imaging ordered have been independently reviewed and interpreted by myself.    OUTSIDE RECORDS REVIEWED: Reviewed recent notes at [facility].       FINAL CLINICAL IMPRESSION(S) / ED DIAGNOSES   Final diagnoses:  Nonspecific chest pain  Anemia due to chronic kidney disease, on chronic dialysis [facility])  Hypertensive urgency     Rx / DC Orders   ED Discharge Orders     None        Note:  This document was prepared using Dragon voice recognition software and may include unintentional dictation errors.   Mishayla Sliwinski, Layla Maw, DO 08/09/23 [id number]

## 2023-08-09 NOTE — Progress Notes (Signed)
 Patient reports that she moved from [place] to [place] last Monday 3/3 to live with her daughter. She last had dialysis on Friday 3/1. She did make her nephrologist aware she was moving and no local treatment center or md was set up. Patient reports she had no transportation to her center in [place]. She was being treated by Dr. Rayna Sexton at [facility] nephrology and her center was [facility] on [address] in [place]. SW consult placed for assistance.

## 2023-08-09 NOTE — TOC CM/SW Note (Addendum)
 TOC received consult for dialysis/nephrology needs. CSW sent message to Nephrology NP and Dialysis Coordinator requesting follow up.  Nephrology NP states Dialysis Coordinator can follow up on this tomorrow when back on site. Included weekday TOC.  Alfonso Ramus, LCSW Transitions of Care Department [phone]

## 2023-08-09 NOTE — ED Notes (Signed)
 Pt resting quietly with eyes closed. Respirations even and non labored. CB within reach.

## 2023-08-10 ENCOUNTER — Encounter: Payer: Self-pay | Admitting: Internal Medicine

## 2023-08-10 DIAGNOSIS — I10 Essential (primary) hypertension: Secondary | ICD-10-CM

## 2023-08-10 DIAGNOSIS — D649 Anemia, unspecified: Secondary | ICD-10-CM

## 2023-08-10 DIAGNOSIS — Z992 Dependence on renal dialysis: Secondary | ICD-10-CM

## 2023-08-10 DIAGNOSIS — I5032 Chronic diastolic (congestive) heart failure: Secondary | ICD-10-CM

## 2023-08-10 DIAGNOSIS — R0789 Other chest pain: Secondary | ICD-10-CM

## 2023-08-10 DIAGNOSIS — N186 End stage renal disease: Secondary | ICD-10-CM

## 2023-08-10 DIAGNOSIS — I132 Hypertensive heart and chronic kidney disease with heart failure and with stage 5 chronic kidney disease, or end stage renal disease: Secondary | ICD-10-CM | POA: Diagnosis not present

## 2023-08-10 LAB — CBC
HCT: 25.9 % — ABNORMAL LOW (ref 36.0–46.0)
Hemoglobin: 8.7 g/dL — ABNORMAL LOW (ref 12.0–15.0)
MCH: 27.6 pg (ref 26.0–34.0)
MCHC: 33.6 g/dL (ref 30.0–36.0)
MCV: 82.2 fL (ref 80.0–100.0)
Platelets: 192 10*3/uL (ref 150–400)
RBC: 3.15 MIL/uL — ABNORMAL LOW (ref 3.87–5.11)
RDW: 17.2 % — ABNORMAL HIGH (ref 11.5–15.5)
WBC: 5.5 10*3/uL (ref 4.0–10.5)
nRBC: 0 % (ref 0.0–0.2)

## 2023-08-10 LAB — BASIC METABOLIC PANEL
Anion gap: 13 (ref 5–15)
BUN: 71 mg/dL — ABNORMAL HIGH (ref 6–20)
CO2: 22 mmol/L (ref 22–32)
Calcium: 8.6 mg/dL — ABNORMAL LOW (ref 8.9–10.3)
Chloride: 104 mmol/L (ref 98–111)
Creatinine, Ser: 7.12 mg/dL — ABNORMAL HIGH (ref 0.44–1.00)
GFR, Estimated: 6 mL/min — ABNORMAL LOW (ref >60)
Glucose, Bld: 86 mg/dL (ref 70–99)
Potassium: 3.7 mmol/L (ref 3.5–5.1)
Sodium: 139 mmol/L (ref 135–145)

## 2023-08-10 LAB — TYPE AND SCREEN
ABO/RH(D): A POS
Antibody Screen: NEGATIVE
Unit division: 0

## 2023-08-10 LAB — BPAM RBC
Blood Product Expiration Date: 202504082359
ISSUE DATE / TIME: 202503090815
Unit Type and Rh: 6200

## 2023-08-10 LAB — HEPATITIS B SURFACE ANTIGEN: Hepatitis B Surface Ag: NONREACTIVE

## 2023-08-10 LAB — HIV ANTIBODY (ROUTINE TESTING W REFLEX): HIV Screen 4th Generation wRfx: NONREACTIVE

## 2023-08-10 MED ORDER — TERAZOSIN HCL 2 MG PO CAPS
2.0000 mg | ORAL_CAPSULE | Freq: Two times a day (BID) | ORAL | Status: DC
  Administered 2023-08-10 – 2023-09-12 (×60): 2 mg via ORAL
  Filled 2023-08-10 (×69): qty 1

## 2023-08-10 MED ORDER — HEPARIN SODIUM (PORCINE) 1000 UNIT/ML IJ SOLN
INTRAMUSCULAR | Status: AC
Start: 2023-08-10 — End: ?
  Filled 2023-08-10: qty 10

## 2023-08-10 NOTE — ED Notes (Signed)
 Brief changed. Pt denies any other needs at this time. CB remains within reach. Encouraged to alert staff to any needs

## 2023-08-10 NOTE — Plan of Care (Signed)

## 2023-08-10 NOTE — Progress Notes (Signed)
 Progress Note   Patient: Allison Hill OVF:[id number] DOB: 1971/01/08 DOA: 08/09/2023     0 DOS: the patient was seen and examined on 08/10/2023   Brief hospital course: Allison Hill is a 53 y.o. female with medical history significant of ESRD on HD MWF, refractory HTN, chronic HFpEF, chronic iron deficiency anemia presented with new onset of chest pain.   Assessment and Plan: Chest pain-likely atypical Secondary to hypertensive emergency. Patient's troponin negative, EKG unremarkable. ACS ruled out.  She currently denies any chest pain. Echocardiogram 1024 showed normal EF.  Hypertensive emergency: Patient's home medications amlodipine, losartan, Imdur, terazosin and hydralazine resumed.  BP stable now. Compliance with her medications unknown.  ESRD on hemodialysis: Nephrology consulted for dialysis needs. Patient does not have a local dialysis center chair. HD caseworker on board for outpatient HD set up.  Acute on chronic anemia: She denies active bleeding.  Iron profile reviewed. S/p 1 unit PRBC. Hemoglobin 8.7. Continue to monitor H&H daily.  Patient recently moved to her daughter in [place], daughter unable to take care of her.  She was previously homeless.   TOC/PT OT consulted.      Out of bed to chair. Incentive spirometry. Nursing supportive care. Fall, aspiration precautions. DVT prophylaxis   Code Status: Full Code  Subjective: Patient is seen and examined today morning in ED. She is lying in bed.  Denies any chest pain or shortness of breath.  She asked about her hemodialysis which he gets Monday Wednesday Friday.  States she moved from [place].  Physical Exam: Vitals:   08/10/23 1400 08/10/23 1423 08/10/23 1454 08/10/23 1531  BP: 139/86 127/83 135/86 139/88  Pulse: 89 86 89 80  Resp: 20 14 16    Temp:   98.9 F (37.2 C) 98.5 F (36.9 C)  TempSrc:   Oral   SpO2: 100% 99% 100% 99%  Weight:  65.1 kg    Height:        General - Middle  aged [place] American female, no apparent distress HEENT - PERRLA, EOMI, atraumatic head, non tender sinuses. Lung - Clear, basal rales, rhonchi, no wheezes. Heart - S1, S2 heard, no murmurs, rubs, trace pedal edema. Abdomen - Soft, non tender, bowel sounds good Neuro - Alert, awake and oriented x 3, non focal exam. Skin - Warm and dry.  Data Reviewed:      Latest Ref Rng & Units 08/10/2023    2:38 AM 08/09/2023    8:20 AM 08/09/2023    3:52 AM  CBC  WBC 4.0 - 10.5 K/uL 5.5   4.9   Hemoglobin 12.0 - 15.0 g/dL 8.7  7.4  7.5   Hematocrit 36.0 - 46.0 % 25.9  22.8  23.4   Platelets 150 - 400 K/uL 192   163       Latest Ref Rng & Units 08/10/2023    2:38 AM 08/09/2023    3:52 AM 05/17/2007    8:46 PM  BMP  Glucose 70 - 99 mg/dL 86  85  74   BUN 6 - 20 mg/dL 71  61  8   Creatinine 0.44 - 1.00 mg/dL 8.41  6.60  6.30   Sodium 135 - 145 mmol/L 139  140  139   Potassium 3.5 - 5.1 mmol/L 3.7  3.6  3.4   Chloride 98 - 111 mmol/L 104  102  106   CO2 22 - 32 mmol/L 22  23  26    Calcium 8.9 - 10.3 mg/dL 8.6  8.9  9.3    DG Chest Portable 1 View Result Date: 08/09/2023 CLINICAL DATA:  Chest pain, dyspnea EXAM: PORTABLE CHEST 1 VIEW COMPARISON:  08/04/2023 FINDINGS: Lungs are clear. No pneumothorax or pleural effusion. Cardiac size is mildly enlarged, unchanged. Right internal jugular hemodialysis catheter is seen with its tip within the right atrium. Pulmonary vascularity is normal. IMPRESSION: 1. Mild cardiomegaly. No active disease. Electronically Signed   By: Helyn Numbers M.D.   On: 08/09/2023 04:12    Family Communication: Discussed with patient, she understand and agree. All questions answered.  Disposition: Status is: Observation The patient remains OBS appropriate and will d/c before 2 midnights.  Planned Discharge Destination: Home with Home Health and Skilled nursing facility     Time spent: 37 minutes  Author: Marcelino Duster, MD 08/10/2023 5:28 PM Secure chat 7am to  7pm For on call review [email or web address].

## 2023-08-10 NOTE — Progress Notes (Signed)
 OT Cancellation Note  Patient Details Name: Allison Hill MRN: [id number] DOB: 09/15/1970   Cancelled Treatment:    Reason Eval/Treat Not Completed: Patient at procedure or test/ unavailable. Pt at dialysis. Will attempt OT evaluation next date as appropriate.   Arman Filter., MPH, MS, OTR/L ascom [phone] 08/10/23, 1:53 PM

## 2023-08-10 NOTE — ED Notes (Signed)
 Holding additonal BP meds until Pt comes back from dialysis

## 2023-08-10 NOTE — Discharge Planning (Signed)
 RNCM contacted by pt daughter, Adaja regarding discharge planning.  Adaja states that pt is not safe at home alone (while she is at work) and needs 24/7 care. RNCM advised that pt can be evaluated for skilled nursing facility and will be given a choice of facilities or if that will be her disposition and we can only make recommendations.  Adaja states pt is totally dependent on her for EVERYTHING and is truly not safe at home as she claims.  RNCM will discuss with [facility] team at [facility] regarding safe discharge.

## 2023-08-10 NOTE — Consult Note (Signed)
 [facility]  CONSULT NOTE    Date: 08/10/2023                  Patient Name:  Allison Hill  MRN: [id number]  DOB: [birth year]  Age / Sex: 53 y.o., female         PCP: Melvenia Beam, MD                 Service Requesting Consult: [facility]                 Reason for Consult: End stage renal disease            History of Present Illness: Ms. Kasie Leccese is a 53 y.o.  female with past medical conditions including hypertension, CHF, anemia, and end-stage renal disease on hemodialysis, who was admitted to [facility] on 08/09/2023 for Hypertensive urgency [I16.0] Chest pain [R07.9] Nonspecific chest pain [R07.9] Anemia due to chronic kidney disease, on chronic dialysis (HCC) [N18.6, D63.1, Z99.2]  Patient reports she recently moved to the area and her outpatient dialysis clinic had not been transferred.  Patient recently resided in [place], [place].  She did receive her last dialysis treatment on Friday.  She reports to the emergency room complaining of chest pain.  Patient is seen and evaluated this morning at bedside in emergency department.  Remains on room air with no lower extremity edema.  Denies nausea or vomiting.  Labs on ED arrival unremarkable for renal patient.  UDS positive for cocaine.  Chest x-ray shows mild cardiomegaly.   Medications: Outpatient medications: Medications Prior to Admission  Medication Sig Dispense Refill Last Dose/Taking   amLODipine (NORVASC) 10 MG tablet Take 10 mg by mouth daily.   08/08/2023 Morning   aspirin EC 81 MG tablet Take by mouth.   08/08/2023 Evening   atorvastatin (LIPITOR) 40 MG tablet Take 40 mg by mouth daily.   08/08/2023 Evening   gabapentin (NEURONTIN) 100 MG capsule Take 200 mg by mouth 2 (two) times daily.   08/08/2023 Evening   hydrALAZINE (APRESOLINE) 100 MG tablet Take 100 mg by mouth 3 (three) times daily.   08/08/2023 Evening   hydrOXYzine (ATARAX) 25 MG tablet Take 25 mg by mouth 3 (three) times  daily.   08/08/2023 Evening   isosorbide mononitrate (IMDUR) 120 MG 24 hr tablet Take 120 mg by mouth daily.   08/08/2023 Morning   isosorbide mononitrate (IMDUR) 30 MG 24 hr tablet Take 30 mg by mouth daily.   08/08/2023 Morning   labetalol (NORMODYNE) 200 MG tablet Take 400 mg by mouth 3 (three) times daily.   08/08/2023 Evening   losartan (COZAAR) 100 MG tablet Take 100 mg by mouth daily.   08/08/2023 Morning   nitroGLYCERIN (NITROSTAT) 0.4 MG SL tablet Place under the tongue.   Past Week   polyethylene glycol powder (GLYCOLAX/MIRALAX) 17 GM/SCOOP powder Take by mouth.   Past Week   senna (SENNA-TIME) 8.6 MG tablet Take by mouth.   Past Week   sevelamer carbonate (RENVELA) 800 MG tablet Take 1,600 mg by mouth 3 (three) times daily.   08/08/2023 Evening   terazosin (HYTRIN) 2 MG capsule Take 2 mg by mouth 2 (two) times daily.   08/08/2023 Bedtime   Cholecalciferol (VITAMIN D-1000 MAX ST) 25 MCG (1000 UT) tablet Take by mouth.       Current medications: Current Facility-Administered Medications  Medication Dose Route Frequency Provider Last Rate Last Admin   acetaminophen (TYLENOL) tablet 650 mg  650 mg Oral Q4H PRN Mikey College T, MD       amLODipine (NORVASC) tablet 10 mg  10 mg Oral Daily Mikey College T, MD   10 mg at 08/10/23 0900   aspirin EC tablet 81 mg  81 mg Oral Daily Mikey College T, MD   81 mg at 08/10/23 0859   atorvastatin (LIPITOR) tablet 40 mg  40 mg Oral Daily Mikey College T, MD   40 mg at 08/10/23 0900   Chlorhexidine Gluconate Cloth 2 % PADS 6 each  6 each Topical Q0600 Wendee Beavers, NP       gabapentin (NEURONTIN) capsule 200 mg  200 mg Oral BID Mikey College T, MD   200 mg at 08/10/23 0859   heparin injection 5,000 Units  5,000 Units Subcutaneous Q12H Mikey College T, MD   5,000 Units at 08/10/23 1610   hydrALAZINE (APRESOLINE) tablet 100 mg  100 mg Oral TID Mikey College T, MD   100 mg at 08/10/23 0859   hydrOXYzine (ATARAX) tablet 25 mg  25 mg Oral TID Mikey College T, MD   25 mg at  08/10/23 0859   isosorbide mononitrate (IMDUR) 24 hr tablet 120 mg  120 mg Oral Daily Mikey College T, MD   120 mg at 08/10/23 0900   labetalol (NORMODYNE) tablet 600 mg  600 mg Oral BID Mikey College T, MD   600 mg at 08/09/23 2134   losartan (COZAAR) tablet 100 mg  100 mg Oral Daily Mikey College T, MD   100 mg at 08/10/23 0900   ondansetron (ZOFRAN) injection 4 mg  4 mg Intravenous Q6H PRN Emeline General, MD       senna (SENOKOT) tablet 8.6 mg  1 tablet Oral QHS PRN Mikey College T, MD       sevelamer carbonate (RENVELA) tablet 1,600 mg  1,600 mg Oral TID with meals Mikey College T, MD   1,600 mg at 08/10/23 9604   terazosin (HYTRIN) capsule 2 mg  2 mg Oral BID Marcelino Duster, MD          Allergies: Allergies  Allergen Reactions   Ceftriaxone Shortness Of Breath   Shellfish Allergy Hives   Minoxidil Swelling      Past Medical History: Past Medical History:  Diagnosis Date   CHF (congestive heart failure) (HCC)    Hypertension    Renal disorder      Past Surgical History: History reviewed. No pertinent surgical history.   Family History: History reviewed. No pertinent family history.   Social History: Social History   Socioeconomic History   Marital status: Single    Spouse name: Not on file   Number of children: Not on file   Years of education: Not on file   Highest education level: Not on file  Occupational History   Not on file  Tobacco Use   Smoking status: Not on file   Smokeless tobacco: Not on file  Substance and Sexual Activity   Alcohol use: Not on file   Drug use: Not on file   Sexual activity: Not on file  Other Topics Concern   Not on file  Social History Narrative   Not on file   Social Drivers of Health   Financial Resource Strain: Patient Declined (01/03/2023)   Received from [facility]   Overall Financial Resource Strain (CARDIA)    Difficulty of Paying Living Expenses: Patient declined  Food Insecurity: No Food  Insecurity (08/10/2023)   Hunger  Vital Sign    Worried About Programme researcher, broadcasting/film/video in the Last Year: Never true    Ran Out of Food in the Last Year: Never true  Recent Concern: Food Insecurity - High Risk (07/17/2023)   Received from [facility]   Hunger Vital Sign    Worried About Running Out of Food in the Last Year: Often true    Ran Out of Food in the Last Year: Often true  Transportation Needs: Unmet Transportation Needs (08/10/2023)   PRAPARE - Administrator, Civil Service (Medical): Yes    Lack of Transportation (Non-Medical): Yes  Physical Activity: Not on file  Stress: Not on file  Social Connections: Unknown (08/10/2023)   Social Connection and Isolation Panel [NHANES]    Frequency of Communication with Friends and Family: More than three times a week    Frequency of Social Gatherings with Friends and Family: More than three times a week    Attends Religious Services: Never    Database administrator or Organizations: No    Attends Banker Meetings: Never    Marital Status: Patient declined  Catering manager Violence: Not At Risk (08/10/2023)   Humiliation, Afraid, Rape, and Kick questionnaire    Fear of Current or Ex-Partner: No    Emotionally Abused: No    Physically Abused: No    Sexually Abused: No     Review of Systems: Review of Systems  Constitutional:  Negative for chills, fever and malaise/fatigue.  HENT:  Negative for congestion, sore throat and tinnitus.   Eyes:  Negative for blurred vision and redness.  Respiratory:  Negative for cough, shortness of breath and wheezing.   Cardiovascular:  Positive for chest pain. Negative for palpitations, claudication and leg swelling.  Gastrointestinal:  Negative for abdominal pain, blood in stool, diarrhea, nausea and vomiting.  Genitourinary:  Negative for flank pain, frequency and hematuria.  Musculoskeletal:  Negative for back pain, falls and myalgias.  Skin:  Negative for rash.  Neurological:   Negative for dizziness, weakness and headaches.  Endo/Heme/Allergies:  Does not bruise/bleed easily.  Psychiatric/Behavioral:  Negative for depression. The patient is not nervous/anxious and does not have insomnia.     Vital Signs: Blood pressure 135/86, pulse 89, temperature 98.9 F (37.2 C), temperature source Oral, resp. rate 16, height 5\' 3"  (1.6 m), weight 65.1 kg, SpO2 100%.  Weight trends: Filed Weights   08/09/23 0135 08/10/23 1116 08/10/23 1423  Weight: 68 kg 66.6 kg 65.1 kg    Physical Exam: General: NAD, resting comfortably  Head: Normocephalic, atraumatic. Moist oral mucosal membranes  Eyes: Anicteric  Lungs:  Clear to auscultation, normal effort  Heart: Regular rate and rhythm  Abdomen:  Soft, nontender  Extremities:  No peripheral edema.  Neurologic: Nonfocal, moving all four extremities  Skin: No lesions  Access: Rt internal jugular permcath     Lab results: Basic Metabolic Panel: Recent Labs  Lab 08/09/23 0352 08/10/23 0238  NA 140 139  K 3.6 3.7  CL 102 104  CO2 23 22  GLUCOSE 85 86  BUN 61* 71*  CREATININE 5.73* 7.12*  CALCIUM 8.9 8.6*    Liver Function Tests: No results for input(s): "AST", "ALT", "ALKPHOS", "BILITOT", "PROT", "ALBUMIN" in the last 168 hours. No results for input(s): "LIPASE", "AMYLASE" in the last 168 hours. No results for input(s): "AMMONIA" in the last 168 hours.  CBC: Recent Labs  Lab 08/09/23 0352 08/09/23 0820 08/10/23 0238  WBC 4.9  --  5.5  NEUTROABS 2.9  --   --   HGB 7.5* 7.4* 8.7*  HCT 23.4* 22.8* 25.9*  MCV 85.1  --  82.2  PLT 163  --  192    Cardiac Enzymes: No results for input(s): "CKTOTAL", "CKMB", "CKMBINDEX", "TROPONINI" in the last 168 hours.  BNP: Invalid input(s): "POCBNP"  CBG: No results for input(s): "GLUCAP" in the last 168 hours.  Microbiology: Results for orders placed or performed during the hospital encounter of 05/25/07  Technologist smear review     Status: None   Collection  Time: 05/25/07 11:46 AM  Result Value Ref Range Status   Path Review NO SCHISTOCYTES SEEN  Final    Coagulation Studies: No results for input(s): "LABPROT", "INR" in the last 72 hours.  Urinalysis: No results for input(s): "COLORURINE", "LABSPEC", "PHURINE", "GLUCOSEU", "HGBUR", "BILIRUBINUR", "KETONESUR", "PROTEINUR", "UROBILINOGEN", "NITRITE", "LEUKOCYTESUR" in the last 72 hours.  Invalid input(s): "APPERANCEUR"    Imaging: DG Chest Portable 1 View Result Date: 08/09/2023 CLINICAL DATA:  Chest pain, dyspnea EXAM: PORTABLE CHEST 1 VIEW COMPARISON:  08/04/2023 FINDINGS: Lungs are clear. No pneumothorax or pleural effusion. Cardiac size is mildly enlarged, unchanged. Right internal jugular hemodialysis catheter is seen with its tip within the right atrium. Pulmonary vascularity is normal. IMPRESSION: 1. Mild cardiomegaly. No active disease. Electronically Signed   By: Helyn Numbers M.D.   On: 08/09/2023 04:12     Assessment & Plan: Ms. Tyronica Truxillo is a 53 y.o.  female with past medical conditions including hypertension, CHF, anemia, and end-stage renal disease on hemodialysis, who was admitted to [facility] on 08/09/2023 for Hypertensive urgency [I16.0] Chest pain [R07.9] Nonspecific chest pain [R07.9] Anemia due to chronic kidney disease, on chronic dialysis (HCC) [N18.6, D63.1, Z99.2]  Hypertensive urgency, on admission. Blood pressure 183/111. Chart review states blood pressure elevated when dialysis is missed. Resumed on home medications.   Anemia of chronic kidney disease Lab Results  Component Value Date   HGB 8.7 (L) 08/10/2023  Hgb 7.5, patient received blood transfusion on admission. Will continue to monitor.   3. End stage renal disease on hemodialysis. Last treatment on Friday. States she moved here from [place], outpatient clinic had not been confirmed. Will receive scheduled dialysis today. Renal navigator seeking outpatient dialysis clinic.   4. Secondary  Hyperparathyroidism: with outpatient labs: None available Lab Results  Component Value Date   CALCIUM 8.6 (L) 08/10/2023    Calcium within optimal range. Will order PTH and phosphorus in am.    LOS: 0 Needham Biggins 3/10/20253:03 PM

## 2023-08-10 NOTE — TOC Initial Note (Signed)
 Transition of Care [facility]) - Initial/Assessment Note    Patient Details  Name: Allison Hill MRN: [id number] Date of Birth: 16-Jul-1970  [facility] CM/SW Contact:    Margarito Liner, LCSW Phone Number: 08/10/2023, 12:52 PM  Clinical Narrative:  CSW had message to call daughter. Patient is currently in HD but HD coordinator obtained consent from patient for CSW to call daughter back. CSW called and spoke to daughter. She stated that patient a safety hazard and she does not feel safe having patient in the home on her own. Daughter stated that patient claims she can do things on her own but even with a rollator she is a "severe" fall risk. Daughter stated [place] and patient cannot get out of the house or down the stairs on her own. She also stated that patient cannot get up and to the bathroom in time. Daughter stated that patient misses some medications. Daughter works during the day and takes care of her three small children. Daughter confirmed patient recently moved in with her and stated she was homeless prior to moving in. Patient does get social security and disability and has Medicaid. PT and OT have been consulted but CSW explained that if SNF is recommended, patient would have to agree. Discussed home health but daughter does not think care would be enough unless they can stay 24/7. No further concerns. CSW will continue to follow patient and her daughter for support and facilitate discharge once medically stable.                Expected Discharge Plan:  (TBD) Barriers to Discharge: Continued Medical Work up   Patient Goals and CMS Choice            Expected Discharge Plan and Services     Post Acute Care Choice:  (TBD) Living arrangements for the past 2 months: Single Family Home                                      Prior Living Arrangements/Services Living arrangements for the past 2 months: Single Family Home Lives with:: Adult Children,  Relatives Patient language and need for interpreter reviewed:: Yes        Need for Family Participation in Patient Care: Yes (Comment) Care giver support system in place?: Yes (comment) Current home services: DME Criminal Activity/Legal Involvement Pertinent to Current Situation/Hospitalization: No - Comment as needed  Activities of Daily Living   ADL Screening (condition at time of admission) Independently performs ADLs?: Yes (appropriate for developmental age) Is the patient deaf or have difficulty hearing?: No Does the patient have difficulty seeing, even when wearing glasses/contacts?: No Does the patient have difficulty concentrating, remembering, or making decisions?: No  Permission Sought/Granted Permission sought to share information with : Family Supports Permission granted to share information with : Yes, Verbal Permission Granted  Share Information with NAME: Samella Parr     Permission granted to share info w Relationship: Daughter  Permission granted to share info w Contact Information: [phone]  Emotional Assessment       Orientation: : Oriented to Self, Oriented to Place, Oriented to  Time, Oriented to Situation Alcohol / Substance Use: Not Applicable Psych Involvement: No (comment)  Admission diagnosis:  Hypertensive urgency [I16.0] Chest pain [R07.9] Nonspecific chest pain [R07.9] Anemia due to chronic kidney disease, on chronic dialysis (HCC) [N18.6, D63.1, Z99.2] Patient Active  Problem List   Diagnosis Date Noted   Chest pain 08/09/2023   HTN (hypertension), malignant 08/09/2023   PCP:  Melvenia Beam, MD Pharmacy:   [id number] [place], [place] - [address] AT [facility] [address] [place] [address] Phone: [phone] Fax: [phone]     Social Drivers of Health (SDOH) Social History: SDOH Screenings   Food Insecurity: No Food Insecurity (08/10/2023)  Recent Concern:  Food Insecurity - High Risk (07/17/2023)   Received from [facility]  Housing: Low Risk  (08/10/2023)  Recent Concern: Housing - High Risk (07/22/2023)   Received from [facility]  Transportation Needs: Unmet Transportation Needs (08/10/2023)  Utilities: Not At Risk (08/10/2023)  Recent Concern: Utilities - Medium Risk (07/17/2023)   Received from [facility]  Financial Resource Strain: Patient Declined (01/03/2023)   Received from [facility]  Social Connections: Unknown (08/10/2023)  Tobacco Use: High Risk (08/04/2023)   Received from [facility]   SDOH Interventions:     Readmission Risk Interventions     No data to display

## 2023-08-10 NOTE — Progress Notes (Signed)
 Hemodialysis Note:  Received patient in bed to unit. Alert and oriented. Informed consent singed and in chart.  Treatment initiated: 1137 Treatment completed: 1423  Rinse back 52 minutes early due to machine error.  Access used: Right Subclavian catheter Access issues: None  Patient tolerated well. Transported back to room, alert without acute distress. Report given to patient's RN.  Total UF removed: 1.6 Liters Medications given: None  Post HD weight: 65.1 Kg  [facility]

## 2023-08-10 NOTE — Care Management Obs Status (Signed)
 MEDICARE OBSERVATION STATUS NOTIFICATION   Patient Details  Name: Allison Hill MRN: [id number] Date of Birth: 1970-11-21   Medicare Observation Status Notification Given:  Orland Dec, CMA 08/10/2023, 9:57 AM

## 2023-08-11 ENCOUNTER — Observation Stay

## 2023-08-11 DIAGNOSIS — I10 Essential (primary) hypertension: Secondary | ICD-10-CM | POA: Diagnosis not present

## 2023-08-11 DIAGNOSIS — M25512 Pain in left shoulder: Secondary | ICD-10-CM

## 2023-08-11 DIAGNOSIS — D649 Anemia, unspecified: Secondary | ICD-10-CM | POA: Diagnosis not present

## 2023-08-11 DIAGNOSIS — N186 End stage renal disease: Secondary | ICD-10-CM | POA: Diagnosis not present

## 2023-08-11 DIAGNOSIS — R0789 Other chest pain: Secondary | ICD-10-CM | POA: Diagnosis not present

## 2023-08-11 DIAGNOSIS — I132 Hypertensive heart and chronic kidney disease with heart failure and with stage 5 chronic kidney disease, or end stage renal disease: Secondary | ICD-10-CM | POA: Diagnosis not present

## 2023-08-11 LAB — RENAL FUNCTION PANEL
Albumin: 3.2 g/dL — ABNORMAL LOW (ref 3.5–5.0)
Anion gap: 13 (ref 5–15)
BUN: 62 mg/dL — ABNORMAL HIGH (ref 6–20)
CO2: 25 mmol/L (ref 22–32)
Calcium: 9 mg/dL (ref 8.9–10.3)
Chloride: 101 mmol/L (ref 98–111)
Creatinine, Ser: 5.41 mg/dL — ABNORMAL HIGH (ref 0.44–1.00)
GFR, Estimated: 9 mL/min — ABNORMAL LOW (ref >60)
Glucose, Bld: 84 mg/dL (ref 70–99)
Phosphorus: 5.3 mg/dL — ABNORMAL HIGH (ref 2.5–4.6)
Potassium: 3.9 mmol/L (ref 3.5–5.1)
Sodium: 139 mmol/L (ref 135–145)

## 2023-08-11 LAB — HEPATITIS B SURFACE ANTIBODY, QUANTITATIVE: Hep B S AB Quant (Post): 16.4 m[IU]/mL

## 2023-08-11 LAB — HEPATITIS B CORE ANTIBODY, TOTAL: HEP B CORE AB: NEGATIVE

## 2023-08-11 NOTE — Evaluation (Signed)
 Occupational Therapy Evaluation Patient Details Name: Allison Hill MRN: [id number] DOB: 23-Nov-1970 Today's Date: 08/11/2023   History of Present Illness   Pt is a 53 y.o. female with medical history significant of ESRD on HD MWF, refractory HTN, chronic HFpEF, chronic iron deficiency anemia presented with new onset of chest pain, workup for hypertensive emergency.     Clinical Impressions Pt received upright in chair with PT. Agreeable to handoff to OT for evaluation. PTA, pt uses 4WW for mobility and "needs help" with ADLs, family is not available during the day to assist.   OT provides setup for task performance at sink per pt request; pt dizzy with standing trials for orthostatic assessment and unable to tolerate further ADLs during eval (just returned from bathroom with PT). Pt unable to tolerate standing long enough to obtain BP reading, and returns to bed using RW, minA for environmental navigation. Mod independent for bed mobility to return to supine, with dizziness resolved. Anticipate pt requiring up to minA for LB ADLs. Pt noted to keep eyes shut during ~10 ft mobility, requires cues to open eyes to scan environment for safety, pt constantly insistent that she needs to "go to sleep", potential cognitive involvement and will continue to assess during functional task performance. Pt would benefit from skilled OT services to address noted impairments and functional limitations (see below for any additional details) in order to maximize safety and independence while minimizing falls risk and caregiver burden. Patient will benefit from continued inpatient follow up therapy, <3 hours/day      If plan is discharge home, recommend the following:   A little help with walking and/or transfers;A little help with bathing/dressing/bathroom;Assistance with cooking/housework;Assistance with feeding;Direct supervision/assist for medications management;Direct supervision/assist for financial  management;Assist for transportation;Help with stairs or ramp for entrance;Supervision due to cognitive status     Functional Status Assessment   Patient has had a recent decline in their functional status and demonstrates the ability to make significant improvements in function in a reasonable and predictable amount of time.     Equipment Recommendations   None recommended by OT (defer)     Recommendations for Other Services         Precautions/Restrictions   Precautions Precautions: Fall Restrictions Weight Bearing Restrictions Per Provider Order: No     Mobility Bed Mobility Overal bed mobility: Modified Independent                  Transfers Overall transfer level: Needs assistance Equipment used: Rolling walker (2 wheels) Transfers: Sit to/from Stand Sit to Stand: Min assist           General transfer comment: minA for standing from chair in room, once standing pt required minA for steadying assist and to open eyes to pay attention to surroundings      Balance Overall balance assessment: Needs assistance Sitting-balance support: Feet supported Sitting balance-Leahy Scale: Good     Standing balance support: Bilateral upper extremity supported, Reliant on assistive device for balance, During functional activity Standing balance-Leahy Scale: Fair                             ADL either performed or assessed with clinical judgement   ADL Overall ADL's : Needs assistance/impaired                         Toilet Transfer: Regular Toilet;Ambulation;Rolling walker (2 wheels) Toilet Transfer Details (  indicate cue type and reason): clinical judgement + discussion with PT Toileting- Clothing Manipulation and Hygiene: Sitting/lateral lean;Contact guard assist       Functional mobility during ADLs: Minimal assistance;Cueing for sequencing;Cueing for safety;Rolling walker (2 wheels) (walks ~10 feet from chair outside bathroom back  to bed, pt requires minA for RW mgmt, cues for safety and sequencing, and tries to walk with eyes closed, requiring cues to pay attention to environment) General ADL Comments: OT provides setup for task performance at sink per pt request; pt dizzy with standing trials for orthostatic assessment. Pt unable to tolerate standing long enough to obtain BP reading, and returns to bed using RW, minA for environmental navigation      Pertinent Vitals/Pain Pain Assessment Pain Assessment: Faces Faces Pain Scale: Hurts a little bit Pain Location: dizziness Pain Descriptors / Indicators: Dull Pain Intervention(s): Limited activity within patient's tolerance     Extremity/Trunk Assessment Upper Extremity Assessment Upper Extremity Assessment: Generalized weakness   Lower Extremity Assessment Lower Extremity Assessment: Generalized weakness;LLE deficits/detail LLE Deficits / Details: pt reports L sided deficits from CVA   Cervical / Trunk Assessment Cervical / Trunk Assessment: Normal   Communication Communication Communication: Impaired Factors Affecting Communication: Difficulty expressing self   Cognition Arousal: Alert Behavior During Therapy: WFL for tasks assessed/performed Cognition: Cognition impaired, No family/caregiver present to determine baseline           Executive functioning impairment (select all impairments): Organization, Initiation, Sequencing, Reasoning, Problem solving OT - Cognition Comments: suspect some level of cognitive involvement -  pt requires increased time for processing, overly dramatic with "being sleepy" and attempts to walk with eyes closed                 Following commands: Impaired Following commands impaired: Follows one step commands with increased time     Cueing  General Comments   Cueing Techniques: Verbal cues;Gestural cues              Home Living Family/patient expects to be discharged to:: Private residence Living  Arrangements: Children Available Help at Discharge: Family Type of Home: House Home Access: Stairs to enter Secretary/administrator of Steps: 3 Entrance Stairs-Rails: Right Home Layout: Two level Alternate Level Stairs-Number of Steps: 3 Alternate Level Stairs-Rails: Right Bathroom Shower/Tub: Walk-in shower;Tub/shower unit   Bathroom Toilet: Handicapped height Bathroom Accessibility: Yes   Home Equipment: Rollator (4 wheels)          Prior Functioning/Environment Prior Level of Function : Needs assist       Physical Assist : ADLs (physical);Mobility (physical) Mobility (physical): Gait;Stairs ADLs (physical): Bathing;IADLs Mobility Comments: 4WW ADLs Comments: says she needs "help" but cannot clarify when this writer asks, pt says that she needs assist for "everything but my family works and can't help"    OT Problem List: Decreased strength;Decreased activity tolerance;Impaired balance (sitting and/or standing);Decreased coordination;Decreased safety awareness;Decreased knowledge of use of DME or AE   OT Treatment/Interventions: Self-care/ADL training;Therapeutic exercise;Neuromuscular education;DME and/or AE instruction;Therapeutic activities;Patient/family education;Balance training      OT Goals(Current goals can be found in the care plan section)   Acute Rehab OT Goals OT Goal Formulation: With patient Time For Goal Achievement: 08/25/23 Potential to Achieve Goals: Fair   OT Frequency:  Min 2X/week       AM-PAC OT "6 Clicks" Daily Activity     Outcome Measure Help from another person eating meals?: None Help from another person taking care of personal grooming?: A Little Help from another  person toileting, which includes using toliet, bedpan, or urinal?: A Little Help from another person bathing (including washing, rinsing, drying)?: A Little Help from another person to put on and taking off regular upper body clothing?: A Little Help from another person to  put on and taking off regular lower body clothing?: A Little 6 Click Score: 19   End of Session Equipment Utilized During Treatment: Gait belt;Rolling walker (2 wheels) Nurse Communication: Mobility status  Activity Tolerance: Other (comment) (pt self-limits eval with c/o dizziness and needing to "sleep") Patient left: in bed;with call bell/phone within reach;with bed alarm set  OT Visit Diagnosis: Unsteadiness on feet (R26.81);Other abnormalities of gait and mobility (R26.89);Muscle weakness (generalized) (M62.81)                Time: 5784-6962 OT Time Calculation (min): 16 min Charges:  OT General Charges $OT Visit: 1 Visit OT Evaluation $OT Eval Low Complexity: 1 Low  Delila Kuklinski L. Jennfer Gassen, OTR/L  08/11/23, 2:16 PM

## 2023-08-11 NOTE — Plan of Care (Signed)
°  Problem: Education: Goal: Understanding of cardiac disease, CV risk reduction, and recovery process will improve Outcome: Progressing Goal: Individualized Educational Video(s) Outcome: Progressing   Problem: Activity: Goal: Ability to tolerate increased activity will improve Outcome: Progressing   Problem: Cardiac: Goal: Ability to achieve and maintain adequate cardiovascular perfusion will improve Outcome: Progressing   Problem: Health Behavior/Discharge Planning: Goal: Ability to safely manage health-related needs after discharge will improve Outcome: Progressing   Problem: Education: Goal: Knowledge of General Education information will improve Description: Including pain rating scale, medication(s)/side effects and non-pharmacologic comfort measures Outcome: Progressing

## 2023-08-11 NOTE — Progress Notes (Signed)
 [facility]  ROUNDING NOTE   Subjective:   Patient seen sitting up in bed Alert  States she's been having chest discomfort today  Objective:  Vital signs in last 24 hours:  Temp:  [98.2 F (36.8 C)-98.9 F (37.2 C)] 98.5 F (36.9 C) (03/11 1147) Pulse Rate:  [80-90] 85 (03/11 1147) Resp:  [14-20] 18 (03/11 0604) BP: (127-164)/(75-97) 136/88 (03/11 1147) SpO2:  [97 %-100 %] 100 % (03/11 1147) Weight:  [65.1 kg] 65.1 kg (03/10 1423)  Weight change:  Filed Weights   08/09/23 0135 08/10/23 1116 08/10/23 1423  Weight: 68 kg 66.6 kg 65.1 kg    Intake/Output: I/O last 3 completed shifts: In: 480 [P.O.:480] Out: 1600 [Other:1600]   Intake/Output this shift:  Total I/O In: 120 [P.O.:120] Out: -   Physical Exam: General: NAD  Head: Normocephalic, atraumatic. Moist oral mucosal membranes  Eyes: Anicteric  Lungs:  Clear to auscultation, normal effort  Heart: Regular rate and rhythm  Abdomen:  Soft, nontender  Extremities:  No peripheral edema.  Neurologic: Alert, moving all four extremities  Skin: No lesions  Access: Rt chest permcath    Basic Metabolic Panel: Recent Labs  Lab 08/09/23 0352 08/10/23 0238 08/11/23 0413  NA 140 139 139  K 3.6 3.7 3.9  CL 102 104 101  CO2 23 22 25   GLUCOSE 85 86 84  BUN 61* 71* 62*  CREATININE 5.73* 7.12* 5.41*  CALCIUM 8.9 8.6* 9.0  PHOS  --   --  5.3*    Liver Function Tests: Recent Labs  Lab 08/11/23 0413  ALBUMIN 3.2*   No results for input(s): "LIPASE", "AMYLASE" in the last 168 hours. No results for input(s): "AMMONIA" in the last 168 hours.  CBC: Recent Labs  Lab 08/09/23 0352 08/09/23 0820 08/10/23 0238  WBC 4.9  --  5.5  NEUTROABS 2.9  --   --   HGB 7.5* 7.4* 8.7*  HCT 23.4* 22.8* 25.9*  MCV 85.1  --  82.2  PLT 163  --  192    Cardiac Enzymes: No results for input(s): "CKTOTAL", "CKMB", "CKMBINDEX", "TROPONINI" in the last 168 hours.  BNP: Invalid input(s): "POCBNP"  CBG: No  results for input(s): "GLUCAP" in the last 168 hours.  Microbiology: Results for orders placed or performed during the hospital encounter of 05/25/07  Technologist smear review     Status: None   Collection Time: 05/25/07 11:46 AM  Result Value Ref Range Status   Path Review NO SCHISTOCYTES SEEN  Final    Coagulation Studies: No results for input(s): "LABPROT", "INR" in the last 72 hours.  Urinalysis: No results for input(s): "COLORURINE", "LABSPEC", "PHURINE", "GLUCOSEU", "HGBUR", "BILIRUBINUR", "KETONESUR", "PROTEINUR", "UROBILINOGEN", "NITRITE", "LEUKOCYTESUR" in the last 72 hours.  Invalid input(s): "APPERANCEUR"    Imaging: No results found.   Medications:     amLODipine  10 mg Oral Daily   aspirin EC  81 mg Oral Daily   atorvastatin  40 mg Oral Daily   Chlorhexidine Gluconate Cloth  6 each Topical Q0600   gabapentin  200 mg Oral BID   heparin  5,000 Units Subcutaneous Q12H   hydrALAZINE  100 mg Oral TID   hydrOXYzine  25 mg Oral TID   isosorbide mononitrate  120 mg Oral Daily   labetalol  600 mg Oral BID   losartan  100 mg Oral Daily   sevelamer carbonate  1,600 mg Oral TID with meals   terazosin  2 mg Oral BID   acetaminophen, ondansetron (  ZOFRAN) IV, senna  Assessment/ Plan:  [name] is a 53 y.o.  female  with past medical conditions including hypertension, CHF, anemia, and end-stage renal disease on hemodialysis, who was admitted to [facility] on 08/09/2023 for Hypertensive urgency [I16.0] Chest pain [R07.9] Nonspecific chest pain [R07.9] Anemia due to chronic kidney disease, on chronic dialysis (HCC) [N18.6, D63.1, Z99.2]   Hypertensive urgency, on admission. Blood pressure 183/111. Chart review states blood pressure elevated when dialysis is missed. Resumed on home medications.   2. Anemia of chronic kidney disease Lab Results  Component Value Date   HGB 8.7 (L) 08/10/2023    Hgb has improved with blood transfusion. Will continue to monitor.    3. End stage renal disease on hemodialysis. Dialysis received yesterday, UF 1.6L achieved. Next treatment scheduled for Wednesday. Renal navigator seeking outpatient dialysis clinic.   4. Secondary Hyperparathyroidism: with outpatient labs: None available Lab Results  Component Value Date   CALCIUM 9.0 08/11/2023   PHOS 5.3 (H) 08/11/2023    Will continue to monitor bone minerals. PTH pending   LOS: 0 Allison Hill 3/11/20251:57 PM

## 2023-08-11 NOTE — Progress Notes (Signed)
 Progress Note   Patient: Allison Hill DOB: 04-18-1971 DOA: 08/09/2023     0 DOS: the patient was seen and examined on 08/11/2023   Brief hospital course: Allison Hill is a 53 y.o. female with medical history significant of ESRD on HD MWF, refractory HTN, chronic HFpEF, chronic iron deficiency anemia presented with new onset of chest pain.   Assessment and Plan: Chest pain-likely atypical Secondary to hypertensive emergency. Patient's troponin negative, EKG unremarkable. ACS ruled out.  She currently has on and off msk chest pain along with left shoulder pain. Xray left shoulder ordered. Echocardiogram 10/24 showed normal EF.  Hypertensive emergency: Patient's home medications amlodipine, losartan, Imdur, terazosin and hydralazine resumed.  BP stable now. Compliance with her medications unknown.  ESRD on hemodialysis: Nephrology on board for dialysis needs. Patient does not have a local dialysis center chair as she recently moved to live with her daughter in [place]. HD caseworker on board for outpatient HD set up.  Acute on chronic anemia: She denies active bleeding.  Iron profile reviewed. S/p 1 unit PRBC. Hemoglobin 8.7. Continue to monitor H&H daily.  Generalized weakness- Left shoulder pain- Will check xray left shoulder, has decreased ROM. PT/ OT advised HH.       Out of bed to chair. Incentive spirometry. Nursing supportive care. Fall, aspiration precautions. DVT prophylaxis   Code Status: Full Code  Subjective: Patient is seen and examined today morning in ED. She is lying in bed has left shoulder pain.  On and off chest pain, pleuritic, musculoskeletal tenderness noted.  She got out of bed with PT, felt dizzy.  Eating fair, wishes to go home.  Physical Exam: Vitals:   08/11/23 0036 08/11/23 0604 08/11/23 0730 08/11/23 1147  BP: 128/75 (!) 153/84 138/81 136/88  Pulse: 90 84 87 85  Resp: 18 18    Temp: 98.2 F (36.8 C) 98.4 F (36.9  C) 98.2 F (36.8 C) 98.5 F (36.9 C)  TempSrc:      SpO2: 97% 100% 97% 100%  Weight:      Height:        General - Middle aged African American female, distress due to pain. HEENT - PERRLA, EOMI, atraumatic head, non tender sinuses. Lung - Clear, basal rales, rhonchi, no wheezes.  Chest wall tenderness noted. Heart - S1, S2 heard, no murmurs, rubs, trace pedal edema. Abdomen - Soft, non tender, bowel sounds good Neuro - Alert, awake and oriented x 3, non focal exam. Skin - Warm and dry.  Data Reviewed:      Latest Ref Rng & Units 08/10/2023    2:38 AM 08/09/2023    8:20 AM 08/09/2023    3:52 AM  CBC  WBC 4.0 - 10.5 K/uL 5.5   4.9   Hemoglobin 12.0 - 15.0 g/dL 8.7  7.4  7.5   Hematocrit 36.0 - 46.0 % 25.9  22.8  23.4   Platelets 150 - 400 K/uL 192   163       Latest Ref Rng & Units 08/11/2023    4:13 AM 08/10/2023    2:38 AM 08/09/2023    3:52 AM  BMP  Glucose 70 - 99 mg/dL 84  86  85   BUN 6 - 20 mg/dL 62  71  61   Creatinine 0.44 - 1.00 mg/dL 9.56  2.13  0.86   Sodium 135 - 145 mmol/L 139  139  140   Potassium 3.5 - 5.1 mmol/L 3.9  3.7  3.6  Chloride 98 - 111 mmol/L 101  104  102   CO2 22 - 32 mmol/L 25  22  23    Calcium 8.9 - 10.3 mg/dL 9.0  8.6  8.9    No results found.   Family Communication: Discussed with patient, she understand and agree. All questions answered.  Disposition: Status is: Observation The patient remains OBS appropriate and will d/c before 2 midnights. Patient will need outpatient hemodialysis set up prior to discharge  Planned Discharge Destination: Home with Home Health. Local HD setup     Time spent: 37 minutes  Author: Marcelino Duster, MD 08/11/2023 12:45 PM Secure chat 7am to 7pm For on call review [email or web address].

## 2023-08-11 NOTE — Discharge Planning (Signed)
 Following for OPHD placement. Patient previously known at [facility] on [address]. Patient recently moved to [facility] to live with daughter and requires a clinic transfer. Referral sent to [facility] Waiting on clinical acceptance.  Dimas Chyle Dialysis Coordinator II  [facility] Cell: [phone] eFax: [phone] [email or web address]

## 2023-08-11 NOTE — Evaluation (Signed)
 Physical Therapy Evaluation Patient Details Name: Allison Hill MRN: [id number] DOB: November 18, 1970 Today's Date: 08/11/2023  History of Present Illness  Pt is a 53 y.o. female with medical history significant of ESRD on HD MWF, refractory HTN, chronic HFpEF, chronic iron deficiency anemia presented with new onset of chest pain, workup for hypertensive emergency.  Clinical Impression  Pt alert, agreeable to mobility. Complaining of ongoing chest pain, localized to L side, TTP but not concordant pain. Pt referenced feeling like a "mack truck" hit her, dull, achy pain all over, RN notified. Pt reported at baseline she is ambulatory with her rollator, normally able to do her own ADLs but that she had been "slacking lately". Lives with family who per chart cannot provide assistance 24/7 supervision.   She was able to perform bed mobility with extra time and bed rails, modI. Sit <> stand from EOB required a few tries and minA for steadying due to posterior lean. She ambulated to the bathroom and able to don/doff mesh underwear (definite CGA) and performed pericare in sitting. Pt reliant on BUE support on grab bar to stand. The pt reported dizziness when ambulating out of bathroom, able to sit in standard chair. Pt reported improvement in symptoms with time.  Overall the patient demonstrated deficits (see "PT Problem List") that impede the patient's functional abilities, safety, and mobility and would benefit from skilled PT intervention. OT in room at end of session.         If plan is discharge home, recommend the following: A little help with walking and/or transfers;A little help with bathing/dressing/bathroom;Assistance with cooking/housework;Assist for transportation;Help with stairs or ramp for entrance   Can travel by private vehicle   Yes    Equipment Recommendations Other (comment) (TBD)  Recommendations for Other Services       Functional Status Assessment Patient has had a recent decline  in their functional status and demonstrates the ability to make significant improvements in function in a reasonable and predictable amount of time.     Precautions / Restrictions Precautions Precautions: Fall Restrictions Weight Bearing Restrictions Per Provider Order: No      Mobility  Bed Mobility Overal bed mobility: Modified Independent                  Transfers Overall transfer level: Needs assistance   Transfers: Sit to/from Stand Sit to Stand: Contact guard assist, Min assist           General transfer comment: needed momentum, minA for steadying once up into standing, reliant on grab bar from standard commode    Ambulation/Gait Ambulation/Gait assistance: Contact guard assist Gait Distance (Feet):  (33ft to commode, 70ft to chair in room after washing hands due to dizziness) Assistive device: Rolling walker (2 wheels)         General Gait Details: very decreased gait velocity, shuffled step, wide BOS  Stairs            Wheelchair Mobility     Tilt Bed    Modified Rankin (Stroke Patients Only)       Balance Overall balance assessment: Needs assistance Sitting-balance support: Feet supported Sitting balance-Leahy Scale: Good Sitting balance - Comments: performed pericare in sitting       Standing balance comment: varying good-fair. able to doff/don mesh underwear but definite CGA needed                             Pertinent Vitals/Pain  Pain Assessment Pain Assessment: Faces Faces Pain Scale: Hurts a little bit Pain Location: L side chest pain Pain Descriptors / Indicators: Dull Pain Intervention(s): Limited activity within patient's tolerance, Monitored during session, Repositioned    Home Living Family/patient expects to be discharged to:: Private residence Living Arrangements: Children Available Help at Discharge: Family Type of Home: House         Home Layout: Two level Home Equipment: Rollator (4  wheels)      Prior Function               Mobility Comments: pt endorsed using rollator for the last year ADLs Comments: stated normally she can dress/bathe on her own but recently she's been "slacking"     Extremity/Trunk Assessment   Upper Extremity Assessment Upper Extremity Assessment: Generalized weakness    Lower Extremity Assessment Lower Extremity Assessment: Generalized weakness (able to lift BLE against gravity, pt endorsed CVA 1 year ago with L sided deficits)       Communication        Cognition Arousal: Alert Behavior During Therapy: WFL for tasks assessed/performed   PT - Cognitive impairments: No apparent impairments                                 Cueing Cueing Techniques: Verbal cues, Gestural cues     General Comments      Exercises     Assessment/Plan    PT Assessment Patient needs continued PT services  PT Problem List Decreased strength;Decreased activity tolerance;Decreased balance;Decreased mobility;Decreased safety awareness;Decreased knowledge of use of DME       PT Treatment Interventions DME instruction;Balance training;Gait training;Neuromuscular re-education;Stair training;Functional mobility training;Patient/family education;Therapeutic activities;Therapeutic exercise    PT Goals (Current goals can be found in the Care Plan section)  Acute Rehab PT Goals Patient Stated Goal: to feel better PT Goal Formulation: With patient Time For Goal Achievement: 08/25/23 Potential to Achieve Goals: Good    Frequency Min 1X/week     Co-evaluation               AM-PAC PT "6 Clicks" Mobility  Outcome Measure Help needed turning from your back to your side while in a flat bed without using bedrails?: A Little Help needed moving from lying on your back to sitting on the side of a flat bed without using bedrails?: A Little Help needed moving to and from a bed to a chair (including a wheelchair)?: A Little Help needed  standing up from a chair using your arms (e.g., wheelchair or bedside chair)?: A Little Help needed to walk in hospital room?: A Little Help needed climbing 3-5 steps with a railing? : A Lot 6 Click Score: 17    End of Session   Activity Tolerance: Patient limited by fatigue;Other (comment) (limited by dizziness) Patient left: Other (comment) (seated with OT in standard chair for rest break)   PT Visit Diagnosis: Other abnormalities of gait and mobility (R26.89);Difficulty in walking, not elsewhere classified (R26.2);Muscle weakness (generalized) (M62.81)    Time: 1478-2956 PT Time Calculation (min) (ACUTE ONLY): 16 min   Charges:   PT Evaluation $PT Eval Low Complexity: 1 Low PT Treatments $Therapeutic Activity: 8-22 mins PT General Charges $$ ACUTE PT VISIT: 1 Visit         Olga Coaster PT, DPT 11:02 AM,08/11/23

## 2023-08-12 DIAGNOSIS — N186 End stage renal disease: Secondary | ICD-10-CM | POA: Diagnosis not present

## 2023-08-12 DIAGNOSIS — D649 Anemia, unspecified: Secondary | ICD-10-CM | POA: Diagnosis not present

## 2023-08-12 DIAGNOSIS — I10 Essential (primary) hypertension: Secondary | ICD-10-CM | POA: Diagnosis not present

## 2023-08-12 DIAGNOSIS — R0789 Other chest pain: Secondary | ICD-10-CM | POA: Diagnosis not present

## 2023-08-12 DIAGNOSIS — I132 Hypertensive heart and chronic kidney disease with heart failure and with stage 5 chronic kidney disease, or end stage renal disease: Secondary | ICD-10-CM | POA: Diagnosis not present

## 2023-08-12 LAB — BASIC METABOLIC PANEL
Anion gap: 14 (ref 5–15)
BUN: 82 mg/dL — ABNORMAL HIGH (ref 6–20)
CO2: 23 mmol/L (ref 22–32)
Calcium: 9.2 mg/dL (ref 8.9–10.3)
Chloride: 101 mmol/L (ref 98–111)
Creatinine, Ser: 6.91 mg/dL — ABNORMAL HIGH (ref 0.44–1.00)
GFR, Estimated: 7 mL/min — ABNORMAL LOW (ref >60)
Glucose, Bld: 87 mg/dL (ref 70–99)
Potassium: 3.9 mmol/L (ref 3.5–5.1)
Sodium: 138 mmol/L (ref 135–145)

## 2023-08-12 LAB — PARATHYROID HORMONE, INTACT (NO CA): PTH: 1248 pg/mL — ABNORMAL HIGH (ref 15–65)

## 2023-08-12 LAB — HEMOGLOBIN AND HEMATOCRIT, BLOOD
HCT: 25.5 % — ABNORMAL LOW (ref 36.0–46.0)
Hemoglobin: 8.4 g/dL — ABNORMAL LOW (ref 12.0–15.0)

## 2023-08-12 MED ORDER — HEPARIN SODIUM (PORCINE) 1000 UNIT/ML DIALYSIS
1000.0000 [IU] | INTRAMUSCULAR | Status: DC | PRN
  Filled 2023-08-12: qty 1

## 2023-08-12 MED ORDER — PENTAFLUOROPROP-TETRAFLUOROETH EX AERO
1.0000 | INHALATION_SPRAY | CUTANEOUS | Status: DC | PRN
  Filled 2023-08-12: qty 30

## 2023-08-12 MED ORDER — LIDOCAINE-PRILOCAINE 2.5-2.5 % EX CREA: 1.0000 | TOPICAL_CREAM | CUTANEOUS | Status: DC | PRN

## 2023-08-12 MED ORDER — HEPARIN SODIUM (PORCINE) 1000 UNIT/ML DIALYSIS: 1000.0000 [IU] | INTRAMUSCULAR | Status: DC | PRN

## 2023-08-12 MED ORDER — ALTEPLASE 2 MG IJ SOLR: 2.0000 mg | Freq: Once | INTRAMUSCULAR | Status: DC | PRN

## 2023-08-12 NOTE — Progress Notes (Signed)
 Confirmed with patient's outpatient Dialysis unit RN Josefa Half that HD port locks are both 1.6 (blue and red).

## 2023-08-12 NOTE — Plan of Care (Signed)

## 2023-08-12 NOTE — Progress Notes (Signed)
 Progress Note   Patient: Allison Hill ZOX:[id number] DOB: [birth year] DOA: 08/09/2023     0 DOS: the patient was seen and examined on 08/12/2023   Brief hospital course: Avital Dancy is a 53 y.o. female with medical history significant of ESRD on HD MWF, refractory HTN, chronic HFpEF, chronic iron deficiency anemia presented with new onset of chest pain.   Assessment and Plan: Chest pain-likely atypical Secondary to hypertensive emergency. Patient's troponin negative, EKG unremarkable. ACS ruled out.  She currently has on and off msk chest pain along with left shoulder pain. Xray left shoulder showed arthritis. Echocardiogram 10/24 showed normal EF.  Hypertensive emergency: BP improved. Patient's home medications amlodipine, losartan, Imdur, terazosin and hydralazine resumed.  BP stable now. Compliance with her medications unknown.  ESRD on hemodialysis: Nephrology on board for dialysis needs. Patient does not have a local dialysis center chair as she recently moved to live with her daughter in [place]. HD caseworker working on outpatient HD set up.  Acute on chronic anemia: She denies active bleeding.  Iron profile reviewed. S/p 1 unit PRBC. Hemoglobin 8.4. Continue to monitor H&H daily.  Generalized weakness- PT/ OT advised HH.     Out of bed to chair. Incentive spirometry. Nursing supportive care. Fall, aspiration precautions. DVT prophylaxis   Code Status: Full Code  Subjective: Patient is seen and examined today morning in ED. She is lying in bed has dizziness. Eating fair, wishes to go home. Awaiting HD outpatient chair.  Physical Exam: Vitals:   08/12/23 1156 08/12/23 1239 08/12/23 1531 08/12/23 2004  BP:  (!) 166/98 135/81 134/84  Pulse:  87 80 82  Resp:    16  Temp:  98.8 F (37.1 C) 98.3 F (36.8 C) 98.5 F (36.9 C)  TempSrc:  Oral Oral   SpO2:  97% 100% 100%  Weight: 64.5 kg     Height:        General - Middle aged [place] American  female, distress due to dizziness. HEENT - PERRLA, EOMI, atraumatic head, non tender sinuses. Lung - Clear, basal rales, rhonchi, no wheezes.  Chest wall tenderness noted. Heart - S1, S2 heard, no murmurs, rubs, trace pedal edema. Abdomen - Soft, non tender, bowel sounds good Neuro - Alert, awake and oriented x 3, non focal exam. Skin - Warm and dry.  Data Reviewed:      Latest Ref Rng & Units 08/12/2023    5:06 AM 08/10/2023    2:38 AM 08/09/2023    8:20 AM  CBC  WBC 4.0 - 10.5 K/uL  5.5    Hemoglobin 12.0 - 15.0 g/dL 8.4  8.7  7.4   Hematocrit 36.0 - 46.0 % 25.5  25.9  22.8   Platelets 150 - 400 K/uL  192        Latest Ref Rng & Units 08/12/2023    5:06 AM 08/11/2023    4:13 AM 08/10/2023    2:38 AM  BMP  Glucose 70 - 99 mg/dL 87  84  86   BUN 6 - 20 mg/dL 82  62  71   Creatinine 0.44 - 1.00 mg/dL 8.11  9.14  7.82   Sodium 135 - 145 mmol/L 138  139  139   Potassium 3.5 - 5.1 mmol/L 3.9  3.9  3.7   Chloride 98 - 111 mmol/L 101  101  104   CO2 22 - 32 mmol/L 23  25  22    Calcium 8.9 - 10.3 mg/dL 9.2  9.0  8.6  DG Shoulder Left Result Date: 08/11/2023 CLINICAL DATA:  Left shoulder pain.  Pain with movement. EXAM: LEFT SHOULDER - 2+ VIEW COMPARISON:  None Available. FINDINGS: Mild inferior glenohumeral joint space narrowing and inferior glenoid degenerative spurring. Normal alignment of the clavicular joint. There is a moderate spur extending anteriorly and inferiorly from the acromion. Minimal additional distal lateral subacromial spurring on frontal/Grashey views. 4 mm chronic ossicle just inferior to the acromion and superior to the humeral head on frontal view. No acute fracture or dislocation. The visualized portion of the left lung is unremarkable. Mild atherosclerotic calcifications within the aortic arch. IMPRESSION: 1. Mild glenohumeral osteoarthritis. 2. Moderate spur extending anteriorly and inferiorly from the acromion. Electronically Signed   By: Neita Garnet M.D.   On:  08/11/2023 16:24     Family Communication: Discussed with patient, she understand and agree. All questions answered.  Disposition: Status is: Observation The patient remains OBS appropriate and will d/c before 2 midnights. Patient will need outpatient hemodialysis set up prior to discharge  Planned Discharge Destination: Home with Home Health. Local HD setup     Time spent: 36 minutes  Author: Marcelino Duster, MD 08/12/2023 9:28 PM Secure chat 7am to 7pm For on call review [email or web address].

## 2023-08-12 NOTE — Discharge Planning (Signed)
 PLACEMENT RESOLVED Outpatient Facility [facility]  [address] [place] [address] [phone]   Schedule: TTS 6:00am Start date: Tuesday 3/18 5:30am  Spoke with patient's daughter, daughter refuses to take patient home. States her mom is homeless and that her home is unsafe and refuses for her to come stay at her house for safety concerns.   UPDATE 3/13  Welcome letter delivered to patient.  Dimas Chyle Dialysis Coordinator II  [facility] Cell: [phone] eFax: [phone] [email or web address]

## 2023-08-12 NOTE — Progress Notes (Signed)
 [facility]  ROUNDING NOTE   Subjective:   Patient is seen and evaluate during dialysis   HEMODIALYSIS FLOWSHEET:  Blood Flow Rate (mL/min): 399 mL/min Arterial Pressure (mmHg): -163.22 mmHg Venous Pressure (mmHg): 145.25 mmHg TMP (mmHg): 10.91 mmHg Ultrafiltration Rate (mL/min): 828 mL/min Dialysate Flow Rate (mL/min): 300 ml/min Dialysis Fluid Bolus: Normal Saline  Denies pain or discomfort Complaining of cramping during treatment UF reduced.   Objective:  Vital signs in last 24 hours:  Temp:  [97.8 F (36.6 C)-98.7 F (37.1 C)] 98.7 F (37.1 C) (03/12 1143) Pulse Rate:  [79-90] 84 (03/12 1143) Resp:  [12-23] 17 (03/12 1143) BP: (123-171)/(76-108) 159/100 (03/12 1143) SpO2:  [92 %-100 %] 100 % (03/12 1143) Weight:  [64.5 kg-66.4 kg] 64.5 kg (03/12 1156)  Weight change:  Filed Weights   08/10/23 1423 08/12/23 0804 08/12/23 1156  Weight: 65.1 kg 66.4 kg 64.5 kg    Intake/Output: I/O last 3 completed shifts: In: 1560 [P.O.:1560] Out: -    Intake/Output this shift:  Total I/O In: -  Out: 2000 [Other:2000]  Physical Exam: General: NAD  Head: Normocephalic, atraumatic. Moist oral mucosal membranes  Eyes: Anicteric  Lungs:  Clear to auscultation, normal effort  Heart: Regular rate and rhythm  Abdomen:  Soft, nontender  Extremities:  No peripheral edema.  Neurologic: Alert, moving all four extremities  Skin: No lesions  Access: Rt chest permcath    Basic Metabolic Panel: Recent Labs  Lab 08/09/23 0352 08/10/23 0238 08/11/23 0413 08/12/23 0506  NA 140 139 139 138  K 3.6 3.7 3.9 3.9  CL 102 104 101 101  CO2 23 22 25 23   GLUCOSE 85 86 84 87  BUN 61* 71* 62* 82*  CREATININE 5.73* 7.12* 5.41* 6.91*  CALCIUM 8.9 8.6* 9.0 9.2  PHOS  --   --  5.3*  --     Liver Function Tests: Recent Labs  Lab 08/11/23 0413  ALBUMIN 3.2*   No results for input(s): "LIPASE", "AMYLASE" in the last 168 hours. No results for input(s): "AMMONIA" in the  last 168 hours.  CBC: Recent Labs  Lab 08/09/23 0352 08/09/23 0820 08/10/23 0238 08/12/23 0506  WBC 4.9  --  5.5  --   NEUTROABS 2.9  --   --   --   HGB 7.5* 7.4* 8.7* 8.4*  HCT 23.4* 22.8* 25.9* 25.5*  MCV 85.1  --  82.2  --   PLT 163  --  192  --     Cardiac Enzymes: No results for input(s): "CKTOTAL", "CKMB", "CKMBINDEX", "TROPONINI" in the last 168 hours.  BNP: Invalid input(s): "POCBNP"  CBG: No results for input(s): "GLUCAP" in the last 168 hours.  Microbiology: Results for orders placed or performed during the hospital encounter of 05/25/07  Technologist smear review     Status: None   Collection Time: 05/25/07 11:46 AM  Result Value Ref Range Status   Path Review NO SCHISTOCYTES SEEN  Final    Coagulation Studies: No results for input(s): "LABPROT", "INR" in the last 72 hours.  Urinalysis: No results for input(s): "COLORURINE", "LABSPEC", "PHURINE", "GLUCOSEU", "HGBUR", "BILIRUBINUR", "KETONESUR", "PROTEINUR", "UROBILINOGEN", "NITRITE", "LEUKOCYTESUR" in the last 72 hours.  Invalid input(s): "APPERANCEUR"    Imaging: DG Shoulder Left Result Date: 08/11/2023 CLINICAL DATA:  Left shoulder pain.  Pain with movement. EXAM: LEFT SHOULDER - 2+ VIEW COMPARISON:  None Available. FINDINGS: Mild inferior glenohumeral joint space narrowing and inferior glenoid degenerative spurring. Normal alignment of the clavicular joint. There is a  moderate spur extending anteriorly and inferiorly from the acromion. Minimal additional distal lateral subacromial spurring on frontal/Grashey views. 4 mm chronic ossicle just inferior to the acromion and superior to the humeral head on frontal view. No acute fracture or dislocation. The visualized portion of the left lung is unremarkable. Mild atherosclerotic calcifications within the aortic arch. IMPRESSION: 1. Mild glenohumeral osteoarthritis. 2. Moderate spur extending anteriorly and inferiorly from the acromion. Electronically Signed   By:  Neita Garnet M.D.   On: 08/11/2023 16:24     Medications:     amLODipine  10 mg Oral Daily   aspirin EC  81 mg Oral Daily   atorvastatin  40 mg Oral Daily   Chlorhexidine Gluconate Cloth  6 each Topical Q0600   gabapentin  200 mg Oral BID   heparin  5,000 Units Subcutaneous Q12H   hydrALAZINE  100 mg Oral TID   hydrOXYzine  25 mg Oral TID   isosorbide mononitrate  120 mg Oral Daily   labetalol  600 mg Oral BID   losartan  100 mg Oral Daily   sevelamer carbonate  1,600 mg Oral TID with meals   terazosin  2 mg Oral BID   acetaminophen, alteplase, heparin, ondansetron (ZOFRAN) IV, senna  Assessment/ Plan:  [name] is a 53 y.o.  female  with past medical conditions including hypertension, CHF, anemia, and end-stage renal disease on hemodialysis, who was admitted to [facility] on 08/09/2023 for Hypertensive urgency [I16.0] Chest pain [R07.9] Nonspecific chest pain [R07.9] Anemia due to chronic kidney disease, on chronic dialysis (HCC) [N18.6, D63.1, Z99.2]   Hypertensive urgency, on admission. Blood pressure 155/92 during dialysis.  2. Anemia of chronic kidney disease Lab Results  Component Value Date   HGB 8.4 (L) 08/12/2023    Will order ESA with next treatment.   3. End stage renal disease on hemodialysis. Dialysis received earlier today, UF 2L as tolerated. Next treatment scheduled for Friday.  Renal navigator has confirmed outpatient dialysis clinic at [facility] on a TTS schedule.   4. Secondary Hyperparathyroidism: with outpatient labs: None available Lab Results  Component Value Date   PTH 1,248 (H) 08/11/2023   CALCIUM 9.2 08/12/2023   PHOS 5.3 (H) 08/11/2023    Will continue to monitor bone minerals. PTH 1248   LOS: 0 Allison Hill 3/12/202512:05 PM

## 2023-08-12 NOTE — Progress Notes (Signed)
 Hemodialysis note  Received patient in bed to unit. Alert and oriented.  Informed consent signed and in chart.  Tx Duration: 3:27. Patient's blood was rinsed back 3 mins early due to cramps. Cramping was resolved after rinse back.  Patient tolerated well. Transported back to room, alert without acute distress.  Report given to patient's RN.   Access used: Right chest HD catheter Access issues: none  Total UF removed: 2L Medication(s) given:  none  Post HD weight: 64.5 kg   [facility]

## 2023-08-13 DIAGNOSIS — I10 Essential (primary) hypertension: Secondary | ICD-10-CM | POA: Diagnosis not present

## 2023-08-13 DIAGNOSIS — R0789 Other chest pain: Secondary | ICD-10-CM | POA: Diagnosis not present

## 2023-08-13 DIAGNOSIS — N186 End stage renal disease: Secondary | ICD-10-CM | POA: Diagnosis not present

## 2023-08-13 DIAGNOSIS — I132 Hypertensive heart and chronic kidney disease with heart failure and with stage 5 chronic kidney disease, or end stage renal disease: Secondary | ICD-10-CM | POA: Diagnosis not present

## 2023-08-13 DIAGNOSIS — D649 Anemia, unspecified: Secondary | ICD-10-CM | POA: Diagnosis not present

## 2023-08-13 LAB — HEMOGLOBIN AND HEMATOCRIT, BLOOD
HCT: 26.7 % — ABNORMAL LOW (ref 36.0–46.0)
Hemoglobin: 8.8 g/dL — ABNORMAL LOW (ref 12.0–15.0)

## 2023-08-13 LAB — BASIC METABOLIC PANEL
Anion gap: 10 (ref 5–15)
BUN: 58 mg/dL — ABNORMAL HIGH (ref 6–20)
CO2: 26 mmol/L (ref 22–32)
Calcium: 9.3 mg/dL (ref 8.9–10.3)
Chloride: 98 mmol/L (ref 98–111)
Creatinine, Ser: 5.08 mg/dL — ABNORMAL HIGH (ref 0.44–1.00)
GFR, Estimated: 10 mL/min — ABNORMAL LOW (ref >60)
Glucose, Bld: 86 mg/dL (ref 70–99)
Potassium: 4 mmol/L (ref 3.5–5.1)
Sodium: 134 mmol/L — ABNORMAL LOW (ref 135–145)

## 2023-08-13 MED ORDER — EPOETIN ALFA-EPBX 4000 UNIT/ML IJ SOLN
4000.0000 [IU] | INTRAMUSCULAR | Status: DC
Start: 2023-08-14 — End: 2023-08-13

## 2023-08-13 MED ORDER — EPOETIN ALFA-EPBX 4000 UNIT/ML IJ SOLN: 4000.0000 [IU] | INTRAMUSCULAR | Status: DC

## 2023-08-13 MED ORDER — MELATONIN 5 MG PO TABS
5.0000 mg | ORAL_TABLET | Freq: Once | ORAL | Status: AC
Start: 2023-08-14 — End: 2023-08-14
  Administered 2023-08-14: 5 mg via ORAL
  Filled 2023-08-13: qty 1

## 2023-08-13 MED ORDER — CINACALCET HCL 30 MG PO TABS
30.0000 mg | ORAL_TABLET | Freq: Every day | ORAL | Status: DC
  Administered 2023-08-13 – 2023-09-22 (×41): 30 mg via ORAL
  Filled 2023-08-13 (×43): qty 1

## 2023-08-13 NOTE — Progress Notes (Signed)
 Occupational Therapy Treatment Patient Details Name: Allison Hill MRN: [id number] DOB: [birth year] Today's Date: 08/13/2023   History of present illness Pt is a 53 y.o. female with medical history significant of ESRD on HD MWF, refractory HTN, chronic HFpEF, chronic iron deficiency anemia presented with new onset of chest pain, workup for hypertensive emergency.   OT comments  Pt seen for OT tx this date, max encouragement to participate as pt wanted to continue sleeping. Mod independent for bed mobility with use of bed rails, minA to perform functional transfers using RW, stands at sink to perform grooming tasks with minA-CGA for dynamic standing balance. Pt has difficulties keeping LLE within walker due to prior CVA as reported by pt, requires max multimodal cuing for step pattern and balance while navigating environment using RW. Pt demonstrates deficits in balance, strength, and executive functioning and would continue to benefit from skilled OT services.  Pt left in recliner, needs within reach. OT will continue to progress towards functional gains, discharge recommendation remains appropriate.       If plan is discharge home, recommend the following:  A little help with walking and/or transfers;A little help with bathing/dressing/bathroom;Assistance with cooking/housework;Assistance with feeding;Direct supervision/assist for medications management;Direct supervision/assist for financial management;Assist for transportation;Help with stairs or ramp for entrance;Supervision due to cognitive status   Equipment Recommendations  None recommended by OT       Precautions / Restrictions Precautions Precautions: Fall Restrictions Weight Bearing Restrictions Per Provider Order: No       Mobility Bed Mobility Overal bed mobility: Modified Independent             General bed mobility comments: recieved in supine, pt able to sit up with use of bed railks    Transfers Overall  transfer level: Needs assistance Equipment used: Rolling walker (2 wheels) Transfers: Sit to/from Stand Sit to Stand: Min assist           General transfer comment: minA to power up, cues for hand placement and weight shitf to LLE     Balance Overall balance assessment: Needs assistance Sitting-balance support: Feet supported Sitting balance-Leahy Scale: Good     Standing balance support: Bilateral upper extremity supported, Reliant on assistive device for balance, During functional activity Standing balance-Leahy Scale: Fair Standing balance comment: varies depending on task peformance, minA-CGA for safety                           ADL either performed or assessed with clinical judgement   ADL Overall ADL's : Needs assistance/impaired     Grooming: Wash/dry hands;Wash/dry face;Standing Grooming Details (indicate cue type and reason): sink level, CGA-minA for standing balance, pt with limited awareness of RW use and environment                             Functional mobility during ADLs: Rolling walker (2 wheels);Cueing for sequencing;Cueing for safety General ADL Comments: walks to sink for standing grooming tasks, then to recliner using RW with minA. Pt has a difficult time keeping LLE within walker; tactile cues for activation to perform knee flextion and hip internal rotation for better step pattern, poor RW mgmt     Communication Communication Communication: Impaired Factors Affecting Communication: Difficulty expressing self   Cognition Arousal: Alert Behavior During Therapy: WFL for tasks assessed/performed Cognition: Cognition impaired, No family/caregiver present to determine baseline  Following commands: Impaired Following commands impaired: Follows one step commands with increased time      Cueing   Cueing Techniques: Verbal cues, Gestural cues, Tactile cues             Pertinent  Vitals/ Pain       Pain Assessment Pain Assessment: 0-10 Pain Score: 10-Worst pain ever Pain Location: L shoulder Pain Descriptors / Indicators: Dull Pain Intervention(s): Limited activity within patient's tolerance, Monitored during session (RN notified)   Frequency  Min 2X/week        Progress Toward Goals  OT Goals(current goals can now be found in the care plan section)     Acute Rehab OT Goals OT Goal Formulation: With patient Time For Goal Achievement: 08/25/23 Potential to Achieve Goals: Fair ADL Goals Pt Will Perform Grooming: with modified independence;standing Pt Will Perform Upper Body Bathing: with modified independence;sitting;standing Pt Will Perform Lower Body Dressing: with modified independence;sitting/lateral leans;sit to/from stand Pt Will Transfer to Toilet: with modified independence;ambulating;grab bars;regular height toilet Pt Will Perform Toileting - Clothing Manipulation and hygiene: with modified independence;sit to/from stand;sitting/lateral leans  Plan         AM-PAC OT "6 Clicks" Daily Activity     Outcome Measure   Help from another person eating meals?: None Help from another person taking care of personal grooming?: A Little Help from another person toileting, which includes using toliet, bedpan, or urinal?: A Little Help from another person bathing (including washing, rinsing, drying)?: A Little Help from another person to put on and taking off regular upper body clothing?: A Little Help from another person to put on and taking off regular lower body clothing?: A Little 6 Click Score: 19    End of Session Equipment Utilized During Treatment: Gait belt;Rolling walker (2 wheels)  OT Visit Diagnosis: Unsteadiness on feet (R26.81);Other abnormalities of gait and mobility (R26.89);Muscle weakness (generalized) (M62.81)   Activity Tolerance Patient tolerated treatment well   Patient Left in chair;with call bell/phone within reach;with  chair alarm set (staff in room)   Nurse Communication Mobility status;Patient requests pain meds        Time: 1013-1028 OT Time Calculation (min): 15 min  Charges: OT General Charges $OT Visit: 1 Visit OT Treatments $Self Care/Home Management : 8-22 mins Dalexa Gentz L. Delbra Zellars, OTR/L  08/13/23, 10:44 AM

## 2023-08-13 NOTE — Progress Notes (Signed)
 [facility]  ROUNDING NOTE   Subjective:   Resting quietly today.  No acute events noted.   Objective:  Vital signs in last 24 hours:  Temp:  [98.3 F (36.8 C)-98.8 F (37.1 C)] 98.5 F (36.9 C) (03/13 0749) Pulse Rate:  [78-87] 78 (03/13 0749) Resp:  [16-18] 16 (03/13 0749) BP: (126-166)/(81-98) 137/89 (03/13 0749) SpO2:  [97 %-100 %] 100 % (03/13 0749) Weight:  [64.6 kg] 64.6 kg (03/13 0610)  Weight change:  Filed Weights   08/12/23 0804 08/12/23 1156 08/13/23 0610  Weight: 66.4 kg 64.5 kg 64.6 kg    Intake/Output: I/O last 3 completed shifts: In: 960 [P.O.:960] Out: 2000 [Other:2000]   Intake/Output this shift:  Total I/O In: 240 [P.O.:240] Out: -   Physical Exam: General: NAD  Head: Normocephalic, atraumatic. Moist oral mucosal membranes  Eyes: Anicteric  Lungs:  Clear to auscultation, normal effort  Heart: Regular rate and rhythm  Abdomen:  Soft, nontender  Extremities:  No peripheral edema.  Neurologic: Currently  resting quietly.  Skin: No lesions  Access: Rt chest permcath    Basic Metabolic Panel: Recent Labs  Lab 08/09/23 0352 08/10/23 0238 08/11/23 0413 08/12/23 0506 08/13/23 0502  NA 140 139 139 138 134*  K 3.6 3.7 3.9 3.9 4.0  CL 102 104 101 101 98  CO2 23 22 25 23 26   GLUCOSE 85 86 84 87 86  BUN 61* 71* 62* 82* 58*  CREATININE 5.73* 7.12* 5.41* 6.91* 5.08*  CALCIUM 8.9 8.6* 9.0 9.2 9.3  PHOS  --   --  5.3*  --   --     Liver Function Tests: Recent Labs  Lab 08/11/23 0413  ALBUMIN 3.2*   No results for input(s): "LIPASE", "AMYLASE" in the last 168 hours. No results for input(s): "AMMONIA" in the last 168 hours.  CBC: Recent Labs  Lab 08/09/23 0352 08/09/23 0820 08/10/23 0238 08/12/23 0506 08/13/23 0502  WBC 4.9  --  5.5  --   --   NEUTROABS 2.9  --   --   --   --   HGB 7.5* 7.4* 8.7* 8.4* 8.8*  HCT 23.4* 22.8* 25.9* 25.5* 26.7*  MCV 85.1  --  82.2  --   --   PLT 163  --  192  --   --     Cardiac  Enzymes: No results for input(s): "CKTOTAL", "CKMB", "CKMBINDEX", "TROPONINI" in the last 168 hours.  BNP: Invalid input(s): "POCBNP"  CBG: No results for input(s): "GLUCAP" in the last 168 hours.  Microbiology: Results for orders placed or performed during the hospital encounter of 05/25/07  Technologist smear review     Status: None   Collection Time: 05/25/07 11:46 AM  Result Value Ref Range Status   Path Review NO SCHISTOCYTES SEEN  Final    Coagulation Studies: No results for input(s): "LABPROT", "INR" in the last 72 hours.  Urinalysis: No results for input(s): "COLORURINE", "LABSPEC", "PHURINE", "GLUCOSEU", "HGBUR", "BILIRUBINUR", "KETONESUR", "PROTEINUR", "UROBILINOGEN", "NITRITE", "LEUKOCYTESUR" in the last 72 hours.  Invalid input(s): "APPERANCEUR"    Imaging: DG Shoulder Left Result Date: 08/11/2023 CLINICAL DATA:  Left shoulder pain.  Pain with movement. EXAM: LEFT SHOULDER - 2+ VIEW COMPARISON:  None Available. FINDINGS: Mild inferior glenohumeral joint space narrowing and inferior glenoid degenerative spurring. Normal alignment of the clavicular joint. There is a moderate spur extending anteriorly and inferiorly from the acromion. Minimal additional distal lateral subacromial spurring on frontal/Grashey views. 4 mm chronic ossicle just inferior to  the acromion and superior to the humeral head on frontal view. No acute fracture or dislocation. The visualized portion of the left lung is unremarkable. Mild atherosclerotic calcifications within the aortic arch. IMPRESSION: 1. Mild glenohumeral osteoarthritis. 2. Moderate spur extending anteriorly and inferiorly from the acromion. Electronically Signed   By: Neita Garnet M.D.   On: 08/11/2023 16:24     Medications:     amLODipine  10 mg Oral Daily   aspirin EC  81 mg Oral Daily   atorvastatin  40 mg Oral Daily   Chlorhexidine Gluconate Cloth  6 each Topical Q0600   gabapentin  200 mg Oral BID   heparin  5,000 Units  Subcutaneous Q12H   hydrALAZINE  100 mg Oral TID   hydrOXYzine  25 mg Oral TID   isosorbide mononitrate  120 mg Oral Daily   labetalol  600 mg Oral BID   losartan  100 mg Oral Daily   sevelamer carbonate  1,600 mg Oral TID with meals   terazosin  2 mg Oral BID   acetaminophen, heparin, lidocaine-prilocaine, ondansetron (ZOFRAN) IV, pentafluoroprop-tetrafluoroeth, senna  Assessment/ Plan:  [name] is a 53 y.o.  female  with past medical conditions including hypertension, CHF, anemia, and end-stage renal disease on hemodialysis, who was admitted to [facility] on 08/09/2023 for Hypertensive urgency [I16.0] Chest pain [R07.9] Nonspecific chest pain [R07.9] Anemia due to chronic kidney disease, on chronic dialysis (HCC) [N18.6, D63.1, Z99.2]   Hypertensive urgency, on admission. Blood pressure control is acceptable.  Currently managed with amlodipine, hydralazine, isosorbide, labetalol, losartan and terazosin.  2. Anemia of chronic kidney disease Lab Results  Component Value Date   HGB 8.8 (L) 08/13/2023    Continue ESA with dialysis.  3. End stage renal disease on hemodialysis.  Next hemodialysis tentatively planned for Saturday to align with outpatient schedule Renal navigator has confirmed outpatient dialysis clinic at [facility] on a TTS schedule.   4. Secondary Hyperparathyroidism: with outpatient labs: None available Lab Results  Component Value Date   PTH 1,248 (H) 08/11/2023   CALCIUM 9.3 08/13/2023   PHOS 5.3 (H) 08/11/2023    Will continue to monitor bone minerals. PTH 1248 Start Cinacalcet Continue binders-currently on sevelamer carbonate.   LOS: 0 Halford Goetzke 3/13/202512:22 PM

## 2023-08-13 NOTE — Plan of Care (Signed)

## 2023-08-13 NOTE — TOC Progression Note (Signed)
 Transition of Care [facility]) - Progression Note    Patient Details  Name: Allison Hill MRN: [id number] Date of Birth: 09-27-1970  Transition of [facility]) CM/SW Contact  Truddie Hidden, RN Phone Number: 08/13/2023, 2:34 PM  Clinical Narrative:    Spoke with patient regarding discharge plan and therapy's recommendation. Patient is agreeable to SNF. Local facilities for [facility] reviewed. Patient does not have a preference of facilities.   Bed search started.     Expected Discharge Plan:  (TBD) Barriers to Discharge: Continued Medical Work up  Expected Discharge Plan and Services     Post Acute Care Choice:  (TBD) Living arrangements for the past 2 months: Single Family Home                                       Social Determinants of Health (SDOH) Interventions SDOH Screenings   Food Insecurity: No Food Insecurity (08/10/2023)  Recent Concern: Food Insecurity - High Risk (07/17/2023)   Received from [facility]  Housing: Low Risk  (08/10/2023)  Recent Concern: Housing - High Risk (07/22/2023)   Received from [facility]  Transportation Needs: Unmet Transportation Needs (08/10/2023)  Utilities: Not At Risk (08/10/2023)  Recent Concern: Utilities - Medium Risk (07/17/2023)   Received from [facility]  Financial Resource Strain: Patient Declined (01/03/2023)   Received from [facility]  Social Connections: Unknown (08/10/2023)  Tobacco Use: High Risk (08/10/2023)    Readmission Risk Interventions     No data to display

## 2023-08-13 NOTE — NC FL2 (Signed)
 [place] MEDICAID FL2 LEVEL OF CARE FORM     IDENTIFICATION  Patient Name: Allison Hill Birthdate: [birth year] Sex: female Admission Date (Current Location): 08/09/2023  [facility] and [place] Number:  Chiropodist and Address:  [facility], [address] [place], [place] [address]      Provider Number: [id number]  Attending Physician Name and Address:  Marcelino Duster, MD  Relative Name and Phone Number:       Current Level of Care: Hospital Recommended Level of Care: Skilled Nursing Facility Prior Approval Number:    Date Approved/Denied:   PASRR Number: [id number]  Discharge Plan: SNF    Current Diagnoses: Patient Active Problem List   Diagnosis Date Noted   Chest pain 08/09/2023   HTN (hypertension), malignant 08/09/2023    Orientation RESPIRATION BLADDER Height & Weight     Self, Time, Situation, Place  Normal Continent Weight: 142 lb 6.4 oz (64.6 kg) Height:  5\' 3"  (160 cm)  BEHAVIORAL SYMPTOMS/MOOD NEUROLOGICAL BOWEL NUTRITION STATUS   (None)  (None) Continent Diet (Renal/carb modified with fluid restriction: 1200 mL.)  AMBULATORY STATUS COMMUNICATION OF NEEDS Skin   Limited Assist Verbally Normal                       Personal Care Assistance Level of Assistance  Bathing, Feeding, Dressing Bathing Assistance: Limited assistance Feeding assistance: Limited assistance Dressing Assistance: Limited assistance     Functional Limitations Info  Sight, Hearing, Speech Sight Info: Adequate Hearing Info: Adequate Speech Info: Adequate    SPECIAL CARE FACTORS FREQUENCY  PT (By licensed PT), OT (By licensed OT)     PT Frequency: 5 x week OT Frequency: 5 x week            Contractures Contractures Info: Not present    Additional Factors Info  Code Status, Allergies Code Status Info: Full code Allergies Info: Ceftriaxone, Shellfish Allergy, Minoxidil           Current Medications (08/13/2023):   This is the current hospital active medication list Current Facility-Administered Medications  Medication Dose Route Frequency Provider Last Rate Last Admin   acetaminophen (TYLENOL) tablet 650 mg  650 mg Oral Q4H PRN Mikey College T, MD   650 mg at 08/13/23 0838   amLODipine (NORVASC) tablet 10 mg  10 mg Oral Daily Mikey College T, MD   10 mg at 08/13/23 1053   aspirin EC tablet 81 mg  81 mg Oral Daily Mikey College T, MD   81 mg at 08/13/23 1053   atorvastatin (LIPITOR) tablet 40 mg  40 mg Oral Daily Mikey College T, MD   40 mg at 08/13/23 1051   Chlorhexidine Gluconate Cloth 2 % PADS 6 each  6 each Topical Q0600 Wendee Beavers, NP   6 each at 08/13/23 0518   cinacalcet (SENSIPAR) tablet 30 mg  30 mg Oral Q supper Mosetta Pigeon, MD       epoetin alfa-epbx (RETACRIT) injection 4,000 Units  4,000 Units Subcutaneous Q T,Th,Sat-1800 Singh, Harmeet, MD       gabapentin (NEURONTIN) capsule 200 mg  200 mg Oral BID Mikey College T, MD   200 mg at 08/13/23 1051   heparin injection 1,000 Units  1,000 Units Intracatheter PRN Wendee Beavers, NP       heparin injection 5,000 Units  5,000 Units Subcutaneous Q12H Mikey College T, MD   5,000 Units at 08/13/23 1051   hydrALAZINE (APRESOLINE) tablet 100 mg  100  mg Oral TID Mikey College T, MD   100 mg at 08/13/23 1053   hydrOXYzine (ATARAX) tablet 25 mg  25 mg Oral TID Mikey College T, MD   25 mg at 08/13/23 1051   isosorbide mononitrate (IMDUR) 24 hr tablet 120 mg  120 mg Oral Daily Mikey College T, MD   120 mg at 08/13/23 1053   labetalol (NORMODYNE) tablet 600 mg  600 mg Oral BID Mikey College T, MD   600 mg at 08/13/23 1054   lidocaine-prilocaine (EMLA) cream 1 Application  1 Application Topical PRN Wendee Beavers, NP       losartan (COZAAR) tablet 100 mg  100 mg Oral Daily Mikey College T, MD   100 mg at 08/13/23 1054   ondansetron (ZOFRAN) injection 4 mg  4 mg Intravenous Q6H PRN Emeline General, MD       pentafluoroprop-tetrafluoroeth (GEBAUERS) aerosol 1 Application   1 Application Topical PRN Wendee Beavers, NP       senna (SENOKOT) tablet 8.6 mg  1 tablet Oral QHS PRN Mikey College T, MD       sevelamer carbonate (RENVELA) tablet 1,600 mg  1,600 mg Oral TID with meals Mikey College T, MD   1,600 mg at 08/13/23 1610   terazosin (HYTRIN) capsule 2 mg  2 mg Oral BID Marcelino Duster, MD   2 mg at 08/13/23 1053     Discharge Medications: Please see discharge summary for a list of discharge medications.  Relevant Imaging Results:  Relevant Lab Results:   Additional Information SS#: [id number]. Had been getting HD prior to admission but recently set up at [facility]. TTS 6:00. Can start Monday 3/18 at 5:30.  Margarito Liner, LCSW

## 2023-08-13 NOTE — Progress Notes (Signed)
 Progress Note   Patient: Allison Hill ZOX:[id number] DOB: 06/09/1970 DOA: 08/09/2023     0 DOS: the patient was seen and examined on 08/13/2023   Brief hospital course: Allison Hill is a 53 y.o. female with medical history significant of ESRD on HD MWF, refractory HTN, chronic HFpEF, chronic iron deficiency anemia admitted to hospitalist service for evaluation of chest pain, hypertensive urgency.  Patient's chest pain atypical, troponins negative.  Blood pressure improved with home regimen.  Patient lives with her daughter who does not want to take her home given high risk for falls, not safe for her HD transportation.  HD outpatient facility arranged.  TOC working on placement  Assessment and Plan: Chest pain-likely atypical Patient's troponin negative, EKG unremarkable. ACS ruled out.  She currently has on and off musculoskeletal chest pain along with left shoulder pain. Xray left shoulder showed arthritis. Echocardiogram 10/24 showed normal EF.  Hypertensive urgency BP improved with amlodipine, losartan, Imdur, terazosin and hydralazine resumed.  BP stable now. Compliance with her medications advised  ESRD on hemodialysis: Nephrology on board for dialysis needs. HD caseworker has outpatient HD set up. Patient to go for hemodialysis on Saturday to align with outpatient TTS schedule.  Acute on chronic anemia: S/p 1 unit PRBC 08/09/2023. Hemoglobin 8.8 today.  No active bleeding  Generalized weakness- PT/ OT advised HH. Daughter wishes to place her.  TOC working on placement.    Out of bed to chair. Incentive spirometry. Nursing supportive care. Fall, aspiration precautions. DVT prophylaxis   Code Status: Full Code  Subjective: Patient is seen and examined today morning. She is lying in bed.  Does have left shoulder pain.  Denies any chest pain or shortness of breath.  Feels weak.  Awaiting rehab placement.  Physical Exam: Vitals:   08/13/23 0330 08/13/23 0610 08/13/23  0749 08/13/23 1327  BP: (!) 132/90  137/89 103/67  Pulse: 83  78 86  Resp: 18  16 16   Temp: 98.8 F (37.1 C)  98.5 F (36.9 C) 98.3 F (36.8 C)  TempSrc:      SpO2: 100%  100% 99%  Weight:  64.6 kg    Height:        General - Middle aged [place] American female, no apparent distress. HEENT - PERRLA, EOMI, atraumatic head, non tender sinuses. Lung - Clear, basal rales, rhonchi, no wheezes.   Heart - S1, S2 heard, no murmurs, rubs, trace pedal edema. Abdomen - Soft, non tender, bowel sounds good Neuro - Alert, awake and oriented x 3, non focal exam. Skin - Warm and dry.  Data Reviewed:      Latest Ref Rng & Units 08/13/2023    5:02 AM 08/12/2023    5:06 AM 08/10/2023    2:38 AM  CBC  WBC 4.0 - 10.5 K/uL   5.5   Hemoglobin 12.0 - 15.0 g/dL 8.8  8.4  8.7   Hematocrit 36.0 - 46.0 % 26.7  25.5  25.9   Platelets 150 - 400 K/uL   192       Latest Ref Rng & Units 08/13/2023    5:02 AM 08/12/2023    5:06 AM 08/11/2023    4:13 AM  BMP  Glucose 70 - 99 mg/dL 86  87  84   BUN 6 - 20 mg/dL 58  82  62   Creatinine 0.44 - 1.00 mg/dL 8.11  9.14  7.82   Sodium 135 - 145 mmol/L 134  138  139   Potassium 3.5 -  5.1 mmol/L 4.0  3.9  3.9   Chloride 98 - 111 mmol/L 98  101  101   CO2 22 - 32 mmol/L 26  23  25    Calcium 8.9 - 10.3 mg/dL 9.3  9.2  9.0    No results found.    Family Communication: Discussed with patient, she understand and agree. All questions answered.  Disposition: Status is: Observation The patient remains OBS appropriate and will d/c before 2 midnights.   Planned Discharge Destination: Rehab.  Local hemodialysis facility      Time spent: 36 minutes  Author: Marcelino Duster, MD 08/13/2023 1:40 PM Secure chat 7am to 7pm For on call review [email or web address].

## 2023-08-14 ENCOUNTER — Observation Stay

## 2023-08-14 DIAGNOSIS — I160 Hypertensive urgency: Secondary | ICD-10-CM | POA: Diagnosis present

## 2023-08-14 DIAGNOSIS — N186 End stage renal disease: Secondary | ICD-10-CM

## 2023-08-14 DIAGNOSIS — Z992 Dependence on renal dialysis: Secondary | ICD-10-CM

## 2023-08-14 DIAGNOSIS — H6691 Otitis media, unspecified, right ear: Secondary | ICD-10-CM | POA: Diagnosis present

## 2023-08-14 DIAGNOSIS — R0789 Other chest pain: Secondary | ICD-10-CM | POA: Diagnosis not present

## 2023-08-14 DIAGNOSIS — I132 Hypertensive heart and chronic kidney disease with heart failure and with stage 5 chronic kidney disease, or end stage renal disease: Secondary | ICD-10-CM | POA: Diagnosis not present

## 2023-08-14 LAB — RESPIRATORY PANEL BY PCR

## 2023-08-14 LAB — CBC WITH DIFFERENTIAL/PLATELET
Abs Immature Granulocytes: 0.02 10*3/uL (ref 0.00–0.07)
Basophils Absolute: 0 10*3/uL (ref 0.0–0.1)
Basophils Relative: 0 %
Eosinophils Absolute: 0.1 10*3/uL (ref 0.0–0.5)
Eosinophils Relative: 1 %
HCT: 29.4 % — ABNORMAL LOW (ref 36.0–46.0)
Hemoglobin: 9.8 g/dL — ABNORMAL LOW (ref 12.0–15.0)
Immature Granulocytes: 0 %
Lymphocytes Relative: 10 %
Lymphs Abs: 0.9 10*3/uL (ref 0.7–4.0)
MCH: 27.1 pg (ref 26.0–34.0)
MCHC: 33.3 g/dL (ref 30.0–36.0)
MCV: 81.4 fL (ref 80.0–100.0)
Monocytes Absolute: 0.9 10*3/uL (ref 0.1–1.0)
Monocytes Relative: 9 %
Neutro Abs: 7.5 10*3/uL (ref 1.7–7.7)
Neutrophils Relative %: 80 %
Platelets: 192 10*3/uL (ref 150–400)
RBC: 3.61 MIL/uL — ABNORMAL LOW (ref 3.87–5.11)
RDW: 16.5 % — ABNORMAL HIGH (ref 11.5–15.5)
WBC: 9.4 10*3/uL (ref 4.0–10.5)
nRBC: 0 % (ref 0.0–0.2)

## 2023-08-14 LAB — C-REACTIVE PROTEIN: CRP: <0.5 mg/dL (ref <1.0)

## 2023-08-14 LAB — SEDIMENTATION RATE: Sed Rate: 60 mm/h — ABNORMAL HIGH (ref 0–30)

## 2023-08-14 LAB — RESP PANEL BY RT-PCR (RSV, FLU A&B, COVID)  RVPGX2
Influenza A by PCR: NEGATIVE
Influenza B by PCR: NEGATIVE
Resp Syncytial Virus by PCR: NEGATIVE
SARS Coronavirus 2 by RT PCR: NEGATIVE

## 2023-08-14 MED ORDER — PREDNISONE 10 MG (21) PO TBPK
20.0000 mg | ORAL_TABLET | Freq: Every evening | ORAL | Status: AC
  Administered 2023-08-15: 20 mg via ORAL

## 2023-08-14 MED ORDER — FENTANYL CITRATE PF 50 MCG/ML IJ SOSY
25.0000 ug | PREFILLED_SYRINGE | Freq: Once | INTRAMUSCULAR | Status: AC
  Administered 2023-08-14: 25 ug via INTRAVENOUS
  Filled 2023-08-14: qty 1

## 2023-08-14 MED ORDER — FENTANYL CITRATE PF 50 MCG/ML IJ SOSY
12.5000 ug | PREFILLED_SYRINGE | Freq: Once | INTRAMUSCULAR | Status: AC
  Administered 2023-08-14: 12.5 ug via INTRAVENOUS
  Filled 2023-08-14: qty 1

## 2023-08-14 MED ORDER — PREDNISONE 10 MG (21) PO TBPK
10.0000 mg | ORAL_TABLET | ORAL | Status: AC
  Administered 2023-08-15: 10 mg via ORAL

## 2023-08-14 MED ORDER — METHYLPREDNISOLONE SODIUM SUCC 40 MG IJ SOLR: 40.0000 mg | Freq: Once | INTRAMUSCULAR | Status: DC

## 2023-08-14 MED ORDER — PREDNISONE 10 MG (21) PO TBPK
20.0000 mg | ORAL_TABLET | Freq: Every morning | ORAL | Status: DC
  Filled 2023-08-14: qty 21

## 2023-08-14 MED ORDER — PREDNISONE 10 MG (21) PO TBPK: 20.0000 mg | ORAL_TABLET | Freq: Every evening | ORAL | Status: DC

## 2023-08-14 MED ORDER — PREDNISONE 10 MG (21) PO TBPK
10.0000 mg | ORAL_TABLET | Freq: Four times a day (QID) | ORAL | Status: AC
Start: 2023-08-17 — End: 2023-08-20
  Administered 2023-08-17 – 2023-08-20 (×10): 10 mg via ORAL

## 2023-08-14 MED ORDER — PREDNISONE 10 MG (21) PO TBPK
20.0000 mg | ORAL_TABLET | Freq: Every morning | ORAL | Status: AC
  Administered 2023-08-15: 20 mg via ORAL
  Filled 2023-08-14: qty 21

## 2023-08-14 MED ORDER — PREDNISONE 10 MG (21) PO TBPK: 10.0000 mg | ORAL_TABLET | Freq: Three times a day (TID) | ORAL | Status: DC

## 2023-08-14 MED ORDER — METHYLPREDNISOLONE SODIUM SUCC 125 MG IJ SOLR
60.0000 mg | Freq: Once | INTRAMUSCULAR | Status: AC
  Administered 2023-08-14: 60 mg via INTRAVENOUS
  Filled 2023-08-14 (×2): qty 2

## 2023-08-14 MED ORDER — HYDROXYZINE HCL 10 MG PO TABS
10.0000 mg | ORAL_TABLET | Freq: Once | ORAL | Status: AC
  Administered 2023-08-14: 10 mg via ORAL
  Filled 2023-08-14: qty 1

## 2023-08-14 MED ORDER — MORPHINE SULFATE (PF) 2 MG/ML IV SOLN
1.0000 mg | Freq: Once | INTRAVENOUS | Status: AC
  Administered 2023-08-14: 1 mg via INTRAVENOUS
  Filled 2023-08-14: qty 1

## 2023-08-14 MED ORDER — PREDNISONE 10 MG (21) PO TBPK: 10.0000 mg | ORAL_TABLET | ORAL | Status: DC

## 2023-08-14 MED ORDER — FLUTICASONE PROPIONATE 50 MCG/ACT NA SUSP
1.0000 | Freq: Every day | NASAL | Status: DC
  Administered 2023-08-14 – 2023-09-23 (×37): 1 via NASAL
  Filled 2023-08-14 (×3): qty 16

## 2023-08-14 MED ORDER — CIPROFLOXACIN HCL 500 MG PO TABS
500.0000 mg | ORAL_TABLET | Freq: Every evening | ORAL | Status: DC
  Administered 2023-08-15 – 2023-08-18 (×4): 500 mg via ORAL
  Filled 2023-08-14 (×6): qty 1

## 2023-08-14 MED ORDER — SODIUM CHLORIDE 0.9 % IV SOLN
3.0000 g | Freq: Two times a day (BID) | INTRAVENOUS | Status: DC
  Administered 2023-08-14 – 2023-08-16 (×5): 3 g via INTRAVENOUS
  Filled 2023-08-14 (×6): qty 8

## 2023-08-14 MED ORDER — PREDNISONE 10 MG (21) PO TBPK
10.0000 mg | ORAL_TABLET | Freq: Three times a day (TID) | ORAL | Status: AC
  Administered 2023-08-16 (×3): 10 mg via ORAL

## 2023-08-14 MED ORDER — FENTANYL CITRATE PF 50 MCG/ML IJ SOSY
25.0000 ug | PREFILLED_SYRINGE | INTRAMUSCULAR | Status: DC | PRN
  Administered 2023-08-14 (×2): 25 ug via INTRAVENOUS
  Filled 2023-08-14 (×2): qty 1

## 2023-08-14 MED ORDER — CIPROFLOXACIN HCL 500 MG PO TABS
500.0000 mg | ORAL_TABLET | Freq: Once | ORAL | Status: AC
  Administered 2023-08-14: 500 mg via ORAL
  Filled 2023-08-14: qty 1

## 2023-08-14 MED ORDER — PREDNISONE 10 MG (21) PO TBPK: 10.0000 mg | ORAL_TABLET | Freq: Four times a day (QID) | ORAL | Status: DC

## 2023-08-14 NOTE — Progress Notes (Signed)
 Pharmacy Antibiotic Note  Allison Hill is a 53 y.o. female admitted on 08/09/2023 with  inner ear infection .  Pharmacy has been consulted for Unasyn dosing.  Plan: Unasyn 3 gm IV Q12H ordered to start on 3/14 @ 0630.  Height: 5\' 3"  (160 cm) Weight: 65.5 kg (144 lb 6.4 oz) IBW/kg (Calculated) : 52.4  Temp (24hrs), Avg:98.3 F (36.8 C), Min:97.7 F (36.5 C), Max:98.5 F (36.9 C)  Recent Labs  Lab 08/09/23 0352 08/10/23 0238 08/11/23 0413 08/12/23 0506 08/13/23 0502  WBC 4.9 5.5  --   --   --   CREATININE 5.73* 7.12* 5.41* 6.91* 5.08*    Estimated Creatinine Clearance: 11.9 mL/min (A) (by C-G formula based on SCr of 5.08 mg/dL (H)).    Allergies  Allergen Reactions   Ceftriaxone Shortness Of Breath   Shellfish Allergy Hives   Minoxidil Swelling    Antimicrobials this admission:   >>    >>   Dose adjustments this admission:   Microbiology results:  BCx:   UCx:    Sputum:    MRSA PCR:   Thank you for allowing pharmacy to be a part of this patient's care.  Christianjames Soule D 08/14/2023 5:29 AM

## 2023-08-14 NOTE — Progress Notes (Signed)
 [facility]  ROUNDING NOTE   Subjective:   Denies any acute complaints. No leg edema. Complains of pain in the right ear. Getting IV antibiotics-Unasyn and Cipro for ear infection. Requiring pain control with fentanyl and morphine.   Objective:  Vital signs in last 24 hours:  Temp:  [97.7 F (36.5 C)-98.5 F (36.9 C)] 98.5 F (36.9 C) (03/14 0942) Pulse Rate:  [78-89] 80 (03/14 1302) Resp:  [18-20] 20 (03/14 0340) BP: (120-155)/(76-100) 120/78 (03/14 1302) SpO2:  [97 %-100 %] 97 % (03/14 1302) Weight:  [65.5 kg] 65.5 kg (03/14 0518)  Weight change: -0.901 kg Filed Weights   08/12/23 1156 08/13/23 0610 08/14/23 0518  Weight: 64.5 kg 64.6 kg 65.5 kg    Intake/Output: I/O last 3 completed shifts: In: 240 [P.O.:240] Out: -    Intake/Output this shift:  Total I/O In: -  Out: 200 [Urine:200]  Physical Exam: General: NAD  Head: Normocephalic, atraumatic. Moist oral mucosal membranes  Eyes: Anicteric  Lungs:  Clear to auscultation, normal effort  Heart: Regular rate and rhythm  Abdomen:  Soft, nontender  Extremities:  No peripheral edema.  Neurologic: Currently  resting quietly.  Skin: No lesions  Access: Rt chest permcath    Basic Metabolic Panel: Recent Labs  Lab 08/09/23 0352 08/10/23 0238 08/11/23 0413 08/12/23 0506 08/13/23 0502  NA 140 139 139 138 134*  K 3.6 3.7 3.9 3.9 4.0  CL 102 104 101 101 98  CO2 23 22 25 23 26   GLUCOSE 85 86 84 87 86  BUN 61* 71* 62* 82* 58*  CREATININE 5.73* 7.12* 5.41* 6.91* 5.08*  CALCIUM 8.9 8.6* 9.0 9.2 9.3  PHOS  --   --  5.3*  --   --     Liver Function Tests: Recent Labs  Lab 08/11/23 0413  ALBUMIN 3.2*   No results for input(s): "LIPASE", "AMYLASE" in the last 168 hours. No results for input(s): "AMMONIA" in the last 168 hours.  CBC: Recent Labs  Lab 08/09/23 0352 08/09/23 0820 08/10/23 0238 08/12/23 0506 08/13/23 0502 08/14/23 0531  WBC 4.9  --  5.5  --   --  9.4  NEUTROABS 2.9  --    --   --   --  7.5  HGB 7.5* 7.4* 8.7* 8.4* 8.8* 9.8*  HCT 23.4* 22.8* 25.9* 25.5* 26.7* 29.4*  MCV 85.1  --  82.2  --   --  81.4  PLT 163  --  192  --   --  192    Cardiac Enzymes: No results for input(s): "CKTOTAL", "CKMB", "CKMBINDEX", "TROPONINI" in the last 168 hours.  BNP: Invalid input(s): "POCBNP"  CBG: No results for input(s): "GLUCAP" in the last 168 hours.  Microbiology: Results for orders placed or performed during the hospital encounter of 08/09/23  Resp panel by RT-PCR (RSV, Flu A&B, Covid) Anterior Nasal Swab     Status: None   Collection Time: 08/14/23  3:44 AM   Specimen: Anterior Nasal Swab  Result Value Ref Range Status   SARS Coronavirus 2 by RT PCR NEGATIVE NEGATIVE Final    Comment: (NOTE) SARS-CoV-2 target nucleic acids are NOT DETECTED.  The SARS-CoV-2 RNA is generally detectable in upper respiratory specimens during the acute phase of infection. The lowest concentration of SARS-CoV-2 viral copies this assay can detect is 138 copies/mL. A negative result does not preclude SARS-Cov-2 infection and should not be used as the sole basis for treatment or other patient management decisions. A negative result may  occur with  improper specimen collection/handling, submission of specimen other than nasopharyngeal swab, presence of viral mutation(s) within the areas targeted by this assay, and inadequate number of viral copies(<138 copies/mL). A negative result must be combined with clinical observations, patient history, and epidemiological information. The expected result is Negative.  Fact Sheet for Patients:  BloggerCourse.com  Fact Sheet for Healthcare Providers:  SeriousBroker.it  This test is no t yet approved or cleared by the [place] FDA and  has been authorized for detection and/or diagnosis of SARS-CoV-2 by FDA under an Emergency Use Authorization (EUA). This EUA will remain  in effect  (meaning this test can be used) for the duration of the COVID-19 declaration under Section 564(b)(1) of the Act, 21 U.S.C.section 360bbb-3(b)(1), unless the authorization is terminated  or revoked sooner.       Influenza A by PCR NEGATIVE NEGATIVE Final   Influenza B by PCR NEGATIVE NEGATIVE Final    Comment: (NOTE) The Xpert Xpress SARS-CoV-2/FLU/RSV plus assay is intended as an aid in the diagnosis of influenza from Nasopharyngeal swab specimens and should not be used as a sole basis for treatment. Nasal washings and aspirates are unacceptable for Xpert Xpress SARS-CoV-2/FLU/RSV testing.  Fact Sheet for Patients: BloggerCourse.com  Fact Sheet for Healthcare Providers: SeriousBroker.it  This test is not yet approved or cleared by the [place] FDA and has been authorized for detection and/or diagnosis of SARS-CoV-2 by FDA under an Emergency Use Authorization (EUA). This EUA will remain in effect (meaning this test can be used) for the duration of the COVID-19 declaration under Section 564(b)(1) of the Act, 21 U.S.C. section 360bbb-3(b)(1), unless the authorization is terminated or revoked.     Resp Syncytial Virus by PCR NEGATIVE NEGATIVE Final    Comment: (NOTE) Fact Sheet for Patients: BloggerCourse.com  Fact Sheet for Healthcare Providers: SeriousBroker.it  This test is not yet approved or cleared by the [place] FDA and has been authorized for detection and/or diagnosis of SARS-CoV-2 by FDA under an Emergency Use Authorization (EUA). This EUA will remain in effect (meaning this test can be used) for the duration of the COVID-19 declaration under Section 564(b)(1) of the Act, 21 U.S.C. section 360bbb-3(b)(1), unless the authorization is terminated or revoked.  Performed at [facility], [address] [place], [place] [address]   Respiratory (~20  pathogens) panel by PCR     Status: None   Collection Time: 08/14/23  3:44 AM   Specimen: Nasopharyngeal Swab; Respiratory  Result Value Ref Range Status   Adenovirus NOT DETECTED NOT DETECTED Final   Coronavirus 229E NOT DETECTED NOT DETECTED Final    Comment: (NOTE) The Coronavirus on the Respiratory Panel, DOES NOT test for the novel  Coronavirus (2019 nCoV)    Coronavirus HKU1 NOT DETECTED NOT DETECTED Final   Coronavirus NL63 NOT DETECTED NOT DETECTED Final   Coronavirus OC43 NOT DETECTED NOT DETECTED Final   Metapneumovirus NOT DETECTED NOT DETECTED Final   Rhinovirus / Enterovirus NOT DETECTED NOT DETECTED Final   Influenza A NOT DETECTED NOT DETECTED Final   Influenza B NOT DETECTED NOT DETECTED Final   Parainfluenza Virus 1 NOT DETECTED NOT DETECTED Final   Parainfluenza Virus 2 NOT DETECTED NOT DETECTED Final   Parainfluenza Virus 3 NOT DETECTED NOT DETECTED Final   Parainfluenza Virus 4 NOT DETECTED NOT DETECTED Final   Respiratory Syncytial Virus NOT DETECTED NOT DETECTED Final   Bordetella pertussis NOT DETECTED NOT DETECTED Final   Bordetella Parapertussis NOT DETECTED  NOT DETECTED Final   Chlamydophila pneumoniae NOT DETECTED NOT DETECTED Final   Mycoplasma pneumoniae NOT DETECTED NOT DETECTED Final    Comment: Performed at [facility], [address] [place], [place] [address]    Coagulation Studies: No results for input(s): "LABPROT", "INR" in the last 72 hours.  Urinalysis: No results for input(s): "COLORURINE", "LABSPEC", "PHURINE", "GLUCOSEU", "HGBUR", "BILIRUBINUR", "KETONESUR", "PROTEINUR", "UROBILINOGEN", "NITRITE", "LEUKOCYTESUR" in the last 72 hours.  Invalid input(s): "APPERANCEUR"    Imaging: CT HEAD WO CONTRAST ( ) Result Date: 08/14/2023 CLINICAL DATA:  Mental status change with unknown cause. Severe right ear pain with agitation EXAM: CT HEAD WITHOUT CONTRAST TECHNIQUE: Contiguous axial images were obtained from the base of the skull  through the vertex without intravenous contrast. RADIATION DOSE REDUCTION: This exam was performed according to the departmental dose-optimization program which includes automated exposure control, adjustment of the mA and/or kV according to patient size and/or use of iterative reconstruction technique. COMPARISON:  06/29/2023 FINDINGS: Brain: No evidence of acute infarction, hemorrhage, hydrocephalus, extra-axial collection or mass lesion/mass effect. Low-density in the cerebral white matter, bilateral thalamus, and bilateral corona radiata likely related to chronic small vessel ischemia. Cerebral volume loss greater than expected for age. Vascular: Tortuous intracranial vessels likely from chronic hypertension. Skull: No acute or aggressive finding Sinuses/Orbits: Partial right mastoid opacification with mild progression. The nasopharynx is unremarkable. Some degree of external auditory canal swelling is possible when compared to prior. IMPRESSION: 1. Right mastoid and middle ear opacification which is mildly progressed from 06/29/2023. Normal nasopharynx. 2. Right external auditory canal narrowing compared to prior, correlate for otitis externa on exam. 3. No acute intracranial finding. Premature chronic small vessel disease. Electronically Signed   By: Tiburcio Pea M.D.   On: 08/14/2023 05:52     Medications:    ampicillin-sulbactam (UNASYN) IV 3 g (08/14/23 0701)    amLODipine  10 mg Oral Daily   aspirin EC  81 mg Oral Daily   atorvastatin  40 mg Oral Daily   Chlorhexidine Gluconate Cloth  6 each Topical Q0600   cinacalcet  30 mg Oral Q supper   [START ON 08/15/2023] ciprofloxacin  500 mg Oral QPM   epoetin alfa-epbx (RETACRIT) injection  4,000 Units Subcutaneous Q T,Th,Sat-1800   fluticasone  1 spray Each Nare Daily   gabapentin  200 mg Oral BID   heparin  5,000 Units Subcutaneous Q12H   hydrALAZINE  100 mg Oral TID   hydrOXYzine  25 mg Oral TID   isosorbide mononitrate  120 mg Oral Daily    labetalol  600 mg Oral BID   losartan  100 mg Oral Daily   [START ON 08/15/2023] predniSONE  10 mg Oral PC lunch   [START ON 08/15/2023] predniSONE  10 mg Oral PC supper   [START ON 08/16/2023] predniSONE  10 mg Oral 3 x daily with food   [START ON 08/17/2023] predniSONE  10 mg Oral 4X daily taper   [START ON 08/15/2023] predniSONE  20 mg Oral AC breakfast   [START ON 08/15/2023] predniSONE  20 mg Oral Nightly   [START ON 08/16/2023] predniSONE  20 mg Oral Nightly   sevelamer carbonate  1,600 mg Oral TID with meals   terazosin  2 mg Oral BID   acetaminophen, fentaNYL (SUBLIMAZE) injection, heparin, lidocaine-prilocaine, ondansetron (ZOFRAN) IV, pentafluoroprop-tetrafluoroeth, senna  Assessment/ Plan:  [name] is a 53 y.o.  female  with past medical conditions including hypertension, CHF, anemia, and end-stage renal disease on hemodialysis,  who was admitted to [facility] on 08/09/2023 for Hypertensive urgency [I16.0] Chest pain [R07.9] Nonspecific chest pain [R07.9] Anemia due to chronic kidney disease, on chronic dialysis (HCC) [N18.6, D63.1, Z99.2]   Hypertensive urgency, on admission. Blood pressure control is acceptable.  Currently managed with amlodipine, hydralazine, isosorbide, labetalol, losartan and terazosin.  2. Anemia of chronic kidney disease Lab Results  Component Value Date   HGB 9.8 (L) 08/14/2023    Continue ESA with dialysis.  3. End stage renal disease on hemodialysis.  Next hemodialysis tentatively planned for Saturday to align with outpatient schedule Renal navigator has confirmed outpatient dialysis clinic at [facility] on a TTS schedule.   4. Secondary Hyperparathyroidism: with outpatient labs: None available Lab Results  Component Value Date   PTH 1,248 (H) 08/11/2023   CALCIUM 9.3 08/13/2023   PHOS 5.3 (H) 08/11/2023    Will continue to monitor bone minerals. PTH 1248 Continue Cinacalcet and monitor calcium Continue binders-currently on  sevelamer carbonate.   LOS: 0 Allison Hill 3/14/20252:39 PM

## 2023-08-14 NOTE — Plan of Care (Signed)

## 2023-08-14 NOTE — Assessment & Plan Note (Addendum)
 09-12-2023 pt is ESRD and HD dependent.

## 2023-08-14 NOTE — Assessment & Plan Note (Addendum)
 Resolved. Due to elevated pressures/musculoskeletal issues.

## 2023-08-14 NOTE — Progress Notes (Signed)
       CROSS COVER NOTE  NAME: Allison Hill MRN: [id number] DOB : 02-16-1971 ATTENDING PHYSICIAN: Marcelino Duster, MD    Date of Service   08/14/2023   HPI/Events of Note   Patient c/o severe pain left ear - reports previous ear infection  Interventions   Assessment/Plan:    08/13/2023   11:29 PM 08/13/2023    8:06 PM 08/13/2023    7:31 PM  Vitals with BMI  Systolic 150 127 478  Diastolic 87 85 84  Pulse 89 88 85   Afebrile 98.3 Patient writhing and yelling in pain Agitation and impulsivity secondary to pain severity Denies recent dental pain or dental work Postauricular pain with palpation No lymph node enlargement but general edema right side face and neck Clear fluid drainage right ear canal with fluid and edematous, tympanic membrane difficult to view due to patient movement - cannot rule out rupture  Morphine + fentanyl x 2 for pain CT head W/O contrast otitis interna Resp panel Solumedrol Unasyn  CBC w/diff       Donnie Mesa NP [facility] Cross Cover 7pm-7am - check amion for availability Pager [phone]

## 2023-08-14 NOTE — Assessment & Plan Note (Addendum)
 Severe. Cipro added for broader coverage as the patient has not improved with unasyn alone. Fentanyl for pain control. Blood cultures x 2 drawn. Cultures of the yellow fluid coming out of external canal is cultured. It has grown strep pneumoniae species that appears to be pan sensitive. Will discontinue Unasyn.   IV Fentanyl has been discontinued. She is receiving oral oxycodone for pain control.

## 2023-08-14 NOTE — Progress Notes (Signed)
 Pharmacy Antibiotic Note  Allison Hill is a 53 y.o. female admitted on 08/09/2023 with  inner ear infection .  Pharmacy has been consulted for Unasyn dosing.  Plan: Continue Unasyn 3 gm IV Q12H  Patient on ciprofloxacin 500mg  daily as well per MD orders.  Height: 5\' 3"  (160 cm) Weight: 65.5 kg (144 lb 6.4 oz) IBW/kg (Calculated) : 52.4  Temp (24hrs), Avg:98.3 F (36.8 C), Min:97.7 F (36.5 C), Max:98.5 F (36.9 C)  Recent Labs  Lab 08/09/23 0352 08/10/23 0238 08/11/23 0413 08/12/23 0506 08/13/23 0502 08/14/23 0531  WBC 4.9 5.5  --   --   --  9.4  CREATININE 5.73* 7.12* 5.41* 6.91* 5.08*  --     Estimated Creatinine Clearance: 11.9 mL/min (A) (by C-G formula based on SCr of 5.08 mg/dL (H)).    Allergies  Allergen Reactions   Ceftriaxone Shortness Of Breath   Shellfish Allergy Hives   Minoxidil Swelling    Antimicrobials this admission: 3/14 Unasyn  >>   3/14 Cipro >>   Dose adjustments this admission: ESRD  Microbiology results: 3/14: resp panel negative  Allison Hill PharmD, BCPS 08/14/2023 1:50 PM

## 2023-08-14 NOTE — Progress Notes (Addendum)
  Progress Note   Patient: Allison Hill ZHY:[id number] DOB: 1971/02/25 DOA: 08/09/2023     0 DOS: the patient was seen and examined on 08/14/2023   Brief hospital course: Allison Hill is a 53 y.o. female with medical history significant of ESRD on HD MWF, refractory HTN, chronic HFpEF, chronic iron deficiency anemia admitted to hospitalist service for evaluation of chest pain, hypertensive urgency.  Patient's chest pain atypical, troponins negative.  Blood pressure improved with home regimen.  Patient lives with her daughter who does not want to take her home given high risk for falls, not safe for her HD transportation.  HD outpatient facility arranged.  TOC working on placement  On 08/13/2023/08/14/2023 patient is complaining of severe pain in her right ear that is not controlled by tylenol. The pain has not improved with IV Unasyn. CT head demonstrated mastoiditis and otitis media with swelling of the external canal. The patient was given IV Fentanyl for pain and antibiotics were expanded to include cipro PO. She will undergo dialysis on Saturday as part of her usual schedule.  Assessment and Plan: Otitis media not resolved, right Severe. Cipro added for broader coverage as the patient has not improved with unasyn alone. Fentanyl for pain control. Blood cultures x 2 drawn. Cultures of the yellow fluid coming out of external canal is cultured.  Hypertensive urgency The patient's blood pressure is improved with amlodipine, losartan, imdur, terazosyn, and hydralazine. She is advised to be compliant with her antihypertensives upon discharge.  ESRD on dialysis [facility]) To go for HD on 08/15/2023 to maintain her Tuesday, Thursday, and Saturday schedule.  Chest pain Resolved. Due to elevated pressures.        Subjective: The patient is resting in bed. She continues to complain of severe ear pain.   Physical Exam: Vitals:   08/14/23 0518 08/14/23 0942 08/14/23 1302 08/14/23 1623  BP:  (!)  155/100 120/78 104/65  Pulse:  86 80 92  Resp:      Temp:  98.5 F (36.9 C)    TempSrc:  Oral    SpO2:  100% 97% 100%  Weight: 65.5 kg     Height:       Exam:  Constitutional:  The patient is awake, alert, and oriented x 3. No acute distress. Respiratory:  No increased work of breathing. No wheezes, rales, or rhonchi No tactile fremitus Cardiovascular:  Regular rate and rhythm No murmurs, ectopy, or gallups. No lateral PMI. No thrills. Abdomen:  Abdomen is soft, non-tender, non-distended No hernias, masses, or organomegaly Normoactive bowel sounds.  Musculoskeletal:  No cyanosis, clubbing, or edema Skin:  No rashes, lesions, ulcers palpation of skin: no induration or nodules Neurologic:  CN 2-12 intact Sensation all 4 extremities intact Psychiatric:  Mental status Mood, affect appropriate Orientation to person, place, time  judgment and insight appear intact  Data Reviewed:  CBC CT head  Family Communication: None available.  Disposition: Status is: Observation The patient remains OBS appropriate and will d/c before 2 midnights.  Planned Discharge Destination: Home    Time spent: 34 minutes  Author: Fiza Nation, DO 08/14/2023 5:26 PM  For on call review [email or web address].

## 2023-08-14 NOTE — Assessment & Plan Note (Addendum)
 The patient's blood pressure is improved with amlodipine, losartan, imdur, terazosyn, and hydralazine. She is advised to be compliant with her antihypertensives upon discharge. However, her blood pressures have been elevated today prior to dialysis. Better on 08/17/2023.

## 2023-08-14 NOTE — Plan of Care (Signed)

## 2023-08-15 DIAGNOSIS — R0789 Other chest pain: Secondary | ICD-10-CM | POA: Diagnosis not present

## 2023-08-15 DIAGNOSIS — I132 Hypertensive heart and chronic kidney disease with heart failure and with stage 5 chronic kidney disease, or end stage renal disease: Secondary | ICD-10-CM | POA: Diagnosis not present

## 2023-08-15 LAB — BASIC METABOLIC PANEL
Anion gap: 13 (ref 5–15)
BUN: 104 mg/dL — ABNORMAL HIGH (ref 6–20)
CO2: 22 mmol/L (ref 22–32)
Calcium: 7.4 mg/dL — ABNORMAL LOW (ref 8.9–10.3)
Chloride: 99 mmol/L (ref 98–111)
Creatinine, Ser: 8.1 mg/dL — ABNORMAL HIGH (ref 0.44–1.00)
GFR, Estimated: 6 mL/min — ABNORMAL LOW (ref >60)
Glucose, Bld: 104 mg/dL — ABNORMAL HIGH (ref 70–99)
Potassium: 4.5 mmol/L (ref 3.5–5.1)
Sodium: 134 mmol/L — ABNORMAL LOW (ref 135–145)

## 2023-08-15 LAB — CBC
HCT: 24.3 % — ABNORMAL LOW (ref 36.0–46.0)
Hemoglobin: 8.2 g/dL — ABNORMAL LOW (ref 12.0–15.0)
MCH: 27.9 pg (ref 26.0–34.0)
MCHC: 33.7 g/dL (ref 30.0–36.0)
MCV: 82.7 fL (ref 80.0–100.0)
Platelets: 194 10*3/uL (ref 150–400)
RBC: 2.94 MIL/uL — ABNORMAL LOW (ref 3.87–5.11)
RDW: 16.8 % — ABNORMAL HIGH (ref 11.5–15.5)
WBC: 8.8 10*3/uL (ref 4.0–10.5)
nRBC: 0 % (ref 0.0–0.2)

## 2023-08-15 MED ORDER — EPOETIN ALFA-EPBX 4000 UNIT/ML IJ SOLN
INTRAMUSCULAR | Status: AC
  Filled 2023-08-15: qty 1

## 2023-08-15 MED ORDER — OXYCODONE HCL 5 MG PO TABS
5.0000 mg | ORAL_TABLET | Freq: Four times a day (QID) | ORAL | Status: DC | PRN
  Administered 2023-08-16 – 2023-09-21 (×54): 5 mg via ORAL
  Filled 2023-08-15 (×54): qty 1

## 2023-08-15 MED ORDER — ACETAMINOPHEN 500 MG PO TABS
1000.0000 mg | ORAL_TABLET | Freq: Four times a day (QID) | ORAL | Status: DC
  Administered 2023-08-15 – 2023-08-23 (×14): 1000 mg via ORAL
  Filled 2023-08-15 (×24): qty 2

## 2023-08-15 MED ORDER — EPOETIN ALFA-EPBX 4000 UNIT/ML IJ SOLN
4000.0000 [IU] | INTRAMUSCULAR | Status: DC
Start: 2023-08-15 — End: 2023-08-19
  Administered 2023-08-15 – 2023-08-18 (×3): 4000 [IU] via INTRAVENOUS
  Filled 2023-08-15 (×3): qty 1

## 2023-08-15 NOTE — Progress Notes (Signed)
 [facility]  ROUNDING NOTE   Subjective:  Patient seen on dialysis tolerating treatment. No complaints.   Hemodialysis dialysis treatment flowsheet  Blood flow rate (mL/min):299 Arterial pressures (mmHg): -254.13 Venous pressures (mmHg): 77.57 TMP (mmHg): 35.75 Ultrafiltration rate (mL/min):1000 Dialysate (mL/min):300  Objective:  Vital signs in last 24 hours:  Temp:  [97.6 F (36.4 C)-98.5 F (36.9 C)] 98.5 F (36.9 C) (03/15 1240) Pulse Rate:  [77-92] 88 (03/15 1240) Resp:  [12-23] 16 (03/15 1240) BP: (106-161)/(58-131) 140/89 (03/15 1240) SpO2:  [100 %] 100 % (03/15 1240) Weight:  [63.8 kg-66.1 kg] 63.8 kg (03/15 1152)  Weight change: 0.363 kg Filed Weights   08/15/23 0515 08/15/23 0751 08/15/23 1152  Weight: 65.9 kg 66.1 kg 63.8 kg    Intake/Output: I/O last 3 completed shifts: In: 300 [IV Piggyback:300] Out: 200 [Urine:200]   Intake/Output this shift:  Total I/O In: 0  Out: 1800 [Other:1800]  Physical Exam: General: NAD,   Head: Normocephalic, atraumatic. Moist oral mucosal membranes  Eyes: Anicteric, PERRL  Neck: Supple, trachea midline  Lungs:  Clear to auscultation  Heart: Regular rate and rhythm  Abdomen:  Soft, nontender,   Extremities:  No peripheral edema.  Neurologic: Nonfocal, moving all four extremities  Skin: No lesions  Access: Rt chest PC    Basic Metabolic Panel: Recent Labs  Lab 08/10/23 0238 08/11/23 0413 08/12/23 0506 08/13/23 0502 08/15/23 0330  NA 139 139 138 134* 134*  K 3.7 3.9 3.9 4.0 4.5  CL 104 101 101 98 99  CO2 22 25 23 26 22   GLUCOSE 86 84 87 86 104*  BUN 71* 62* 82* 58* 104*  CREATININE 7.12* 5.41* 6.91* 5.08* 8.10*  CALCIUM 8.6* 9.0 9.2 9.3 7.4*  PHOS  --  5.3*  --   --   --     Liver Function Tests: Recent Labs  Lab 08/11/23 0413  ALBUMIN 3.2*   No results for input(s): "LIPASE", "AMYLASE" in the last 168 hours. No results for input(s): "AMMONIA" in the last 168 hours.  CBC: Recent  Labs  Lab 08/09/23 0352 08/09/23 0820 08/10/23 0238 08/12/23 0506 08/13/23 0502 08/14/23 0531 08/15/23 0330  WBC 4.9  --  5.5  --   --  9.4 8.8  NEUTROABS 2.9  --   --   --   --  7.5  --   HGB 7.5*   < > 8.7* 8.4* 8.8* 9.8* 8.2*  HCT 23.4*   < > 25.9* 25.5* 26.7* 29.4* 24.3*  MCV 85.1  --  82.2  --   --  81.4 82.7  PLT 163  --  192  --   --  192 194   < > = values in this interval not displayed.    Cardiac Enzymes: No results for input(s): "CKTOTAL", "CKMB", "CKMBINDEX", "TROPONINI" in the last 168 hours.  BNP: Invalid input(s): "POCBNP"  CBG: No results for input(s): "GLUCAP" in the last 168 hours.  Microbiology: Results for orders placed or performed during the hospital encounter of 08/09/23  Resp panel by RT-PCR (RSV, Flu A&B, Covid) Anterior Nasal Swab     Status: None   Collection Time: 08/14/23  3:44 AM   Specimen: Anterior Nasal Swab  Result Value Ref Range Status   SARS Coronavirus 2 by RT PCR NEGATIVE NEGATIVE Final    Comment: (NOTE) SARS-CoV-2 target nucleic acids are NOT DETECTED.  The SARS-CoV-2 RNA is generally detectable in upper respiratory specimens during the acute phase of infection. The lowest concentration  of SARS-CoV-2 viral copies this assay can detect is 138 copies/mL. A negative result does not preclude SARS-Cov-2 infection and should not be used as the sole basis for treatment or other patient management decisions. A negative result may occur with  improper specimen collection/handling, submission of specimen other than nasopharyngeal swab, presence of viral mutation(s) within the areas targeted by this assay, and inadequate number of viral copies(<138 copies/mL). A negative result must be combined with clinical observations, patient history, and epidemiological information. The expected result is Negative.  Fact Sheet for Patients:  BloggerCourse.com  Fact Sheet for Healthcare Providers:   SeriousBroker.it  This test is no t yet approved or cleared by the [place] FDA and  has been authorized for detection and/or diagnosis of SARS-CoV-2 by FDA under an Emergency Use Authorization (EUA). This EUA will remain  in effect (meaning this test can be used) for the duration of the COVID-19 declaration under Section 564(b)(1) of the Act, 21 U.S.C.section 360bbb-3(b)(1), unless the authorization is terminated  or revoked sooner.       Influenza A by PCR NEGATIVE NEGATIVE Final   Influenza B by PCR NEGATIVE NEGATIVE Final    Comment: (NOTE) The Xpert Xpress SARS-CoV-2/FLU/RSV plus assay is intended as an aid in the diagnosis of influenza from Nasopharyngeal swab specimens and should not be used as a sole basis for treatment. Nasal washings and aspirates are unacceptable for Xpert Xpress SARS-CoV-2/FLU/RSV testing.  Fact Sheet for Patients: BloggerCourse.com  Fact Sheet for Healthcare Providers: SeriousBroker.it  This test is not yet approved or cleared by the [place] FDA and has been authorized for detection and/or diagnosis of SARS-CoV-2 by FDA under an Emergency Use Authorization (EUA). This EUA will remain in effect (meaning this test can be used) for the duration of the COVID-19 declaration under Section 564(b)(1) of the Act, 21 U.S.C. section 360bbb-3(b)(1), unless the authorization is terminated or revoked.     Resp Syncytial Virus by PCR NEGATIVE NEGATIVE Final    Comment: (NOTE) Fact Sheet for Patients: BloggerCourse.com  Fact Sheet for Healthcare Providers: SeriousBroker.it  This test is not yet approved or cleared by the [place] FDA and has been authorized for detection and/or diagnosis of SARS-CoV-2 by FDA under an Emergency Use Authorization (EUA). This EUA will remain in effect (meaning this test can be used) for  the duration of the COVID-19 declaration under Section 564(b)(1) of the Act, 21 U.S.C. section 360bbb-3(b)(1), unless the authorization is terminated or revoked.  Performed at [facility], [address] [place], [place] [address]   Respiratory (~20 pathogens) panel by PCR     Status: None   Collection Time: 08/14/23  3:44 AM   Specimen: Nasopharyngeal Swab; Respiratory  Result Value Ref Range Status   Adenovirus NOT DETECTED NOT DETECTED Final   Coronavirus 229E NOT DETECTED NOT DETECTED Final    Comment: (NOTE) The Coronavirus on the Respiratory Panel, DOES NOT test for the novel  Coronavirus (2019 nCoV)    Coronavirus HKU1 NOT DETECTED NOT DETECTED Final   Coronavirus NL63 NOT DETECTED NOT DETECTED Final   Coronavirus OC43 NOT DETECTED NOT DETECTED Final   Metapneumovirus NOT DETECTED NOT DETECTED Final   Rhinovirus / Enterovirus NOT DETECTED NOT DETECTED Final   Influenza A NOT DETECTED NOT DETECTED Final   Influenza B NOT DETECTED NOT DETECTED Final   Parainfluenza Virus 1 NOT DETECTED NOT DETECTED Final   Parainfluenza Virus 2 NOT DETECTED NOT DETECTED Final   Parainfluenza Virus 3 NOT  DETECTED NOT DETECTED Final   Parainfluenza Virus 4 NOT DETECTED NOT DETECTED Final   Respiratory Syncytial Virus NOT DETECTED NOT DETECTED Final   Bordetella pertussis NOT DETECTED NOT DETECTED Final   Bordetella Parapertussis NOT DETECTED NOT DETECTED Final   Chlamydophila pneumoniae NOT DETECTED NOT DETECTED Final   Mycoplasma pneumoniae NOT DETECTED NOT DETECTED Final    Comment: Performed at [facility], [address] [place], [place] [address]  Culture, blood (Routine X 2) w Reflex to ID Panel     Status: None (Preliminary result)   Collection Time: 08/14/23  9:19 AM   Specimen: BLOOD  Result Value Ref Range Status   Specimen Description BLOOD BLOOD RIGHT HAND  Final   Special Requests   Final    BOTTLES DRAWN AEROBIC AND ANAEROBIC Blood Culture adequate volume    Culture   Final    NO GROWTH < 24 HOURS Performed at [facility], [address], [place], [place] [address]    Report Status PENDING  Incomplete  Culture, blood (Routine X 2) w Reflex to ID Panel     Status: None (Preliminary result)   Collection Time: 08/14/23  9:19 AM   Specimen: BLOOD  Result Value Ref Range Status   Specimen Description BLOOD BLOOD LEFT HAND  Final   Special Requests   Final    BOTTLES DRAWN AEROBIC AND ANAEROBIC Blood Culture adequate volume   Culture   Final    NO GROWTH < 24 HOURS Performed at [facility], [address], [place], [place] [address]    Report Status PENDING  Incomplete  Ear culture     Status: None (Preliminary result)   Collection Time: 08/14/23 10:11 AM   Specimen: Ear; Other  Result Value Ref Range Status   Specimen Description   Final    EAR Performed at [facility], [address], [place], [place] [address]    Special Requests   Final    RIGHT EAR Performed at [facility], [address] [place], [place] [address]    Culture   Final    RARE STREPTOCOCCUS PNEUMONIAE SUSCEPTIBILITIES TO FOLLOW Performed at [facility], [address], [place], [place] [address]    Report Status PENDING  Incomplete    Coagulation Studies: No results for input(s): "LABPROT", "INR" in the last 72 hours.  Urinalysis: No results for input(s): "COLORURINE", "LABSPEC", "PHURINE", "GLUCOSEU", "HGBUR", "BILIRUBINUR", "KETONESUR", "PROTEINUR", "UROBILINOGEN", "NITRITE", "LEUKOCYTESUR" in the last 72 hours.  Invalid input(s): "APPERANCEUR"    Imaging: CT HEAD WO CONTRAST ( ) Result Date: 08/14/2023 CLINICAL DATA:  Mental status change with unknown cause. Severe right ear pain with agitation EXAM: CT HEAD WITHOUT CONTRAST TECHNIQUE: Contiguous axial images were obtained from the base of the skull through the vertex without intravenous contrast. RADIATION DOSE REDUCTION: This exam was performed  according to the departmental dose-optimization program which includes automated exposure control, adjustment of the mA and/or kV according to patient size and/or use of iterative reconstruction technique. COMPARISON:  06/29/2023 FINDINGS: Brain: No evidence of acute infarction, hemorrhage, hydrocephalus, extra-axial collection or mass lesion/mass effect. Low-density in the cerebral white matter, bilateral thalamus, and bilateral corona radiata likely related to chronic small vessel ischemia. Cerebral volume loss greater than expected for age. Vascular: Tortuous intracranial vessels likely from chronic hypertension. Skull: No acute or aggressive finding Sinuses/Orbits: Partial right mastoid opacification with mild progression. The nasopharynx is unremarkable. Some degree of external auditory canal swelling is possible when  compared to prior. IMPRESSION: 1. Right mastoid and middle ear opacification which is mildly progressed from 06/29/2023. Normal nasopharynx. 2. Right external auditory canal narrowing compared to prior, correlate for otitis externa on exam. 3. No acute intracranial finding. Premature chronic small vessel disease. Electronically Signed   By: Tiburcio Pea M.D.   On: 08/14/2023 05:52     Medications:    ampicillin-sulbactam (UNASYN) IV 3 g (08/15/23 1733)    acetaminophen  1,000 mg Oral Q6H   amLODipine  10 mg Oral Daily   aspirin EC  81 mg Oral Daily   atorvastatin  40 mg Oral Daily   Chlorhexidine Gluconate Cloth  6 each Topical Q0600   cinacalcet  30 mg Oral Q supper   ciprofloxacin  500 mg Oral QPM   epoetin alfa-epbx (RETACRIT) injection  4,000 Units Intravenous Q T,Th,Sat-1800   fluticasone  1 spray Each Nare Daily   gabapentin  200 mg Oral BID   heparin  5,000 Units Subcutaneous Q12H   hydrALAZINE  100 mg Oral TID   hydrOXYzine  25 mg Oral TID   isosorbide mononitrate  120 mg Oral Daily   labetalol  600 mg Oral BID   losartan  100 mg Oral Daily   [START ON 08/16/2023]  predniSONE  10 mg Oral 3 x daily with food   [START ON 08/17/2023] predniSONE  10 mg Oral 4X daily taper   predniSONE  20 mg Oral Nightly   [START ON 08/16/2023] predniSONE  20 mg Oral Nightly   sevelamer carbonate  1,600 mg Oral TID with meals   terazosin  2 mg Oral BID   ondansetron (ZOFRAN) IV, oxyCODONE, senna  Assessment/ Plan:  Allison Hill is a 53 y.o.  female  with past medical conditions including hypertension, CHF, anemia, and end-stage renal disease on hemodialysis, who was admitted to [facility] on 08/09/2023 for Hypertensive urgency [I16.0] Chest pain [R07.9] Nonspecific chest pain [R07.9] Anemia due to chronic kidney disease, on chronic dialysis (HCC) [N18.6, D63.1, Z99.2]     Hypertensive urgency, on admission. Blood pressure control is acceptable.  Currently managed with amlodipine, hydralazine, isosorbide, labetalol, losartan and terazosin.        BP on dialysis 161/78   2. Anemia of chronic kidney disease Recent Labs       Lab Results  Component Value Date    HGB 8.2 (L) 08/15/2023      Continue ESA with dialysis.   3. End stage renal disease on hemodialysis.  Dialysis for today, 2L UF Renal navigator has confirmed outpatient dialysis clinic at [facility] on a TTS schedule.    4. Secondary Hyperparathyroidism: with outpatient labs: None available Recent Labs       Lab Results  Component Value Date    PTH 1,248 (H) 08/11/2023    CALCIUM 7.4 08/15/2023    PHOS 5.3 (H) 08/11/2023      Will continue to monitor bone minerals. PTH 1248 Continue Cinacalcet and monitor calcium Continue binders-currently on sevelamer carbonate.   LOS: 0 Tinzlee Craker Tonny Bollman 3/15/20256:39 PM

## 2023-08-15 NOTE — Progress Notes (Signed)
 Hemodialysis Note:  Received patient in bed to unit. Alert and oriented. Informed consent singed and in chart.  Treatment initiated: 0804 Treatment completed: 1152  Access used: Right subclavian catheter Access issues: None  Patient tolerated well. Transported back to room, alert without acute distress. Report given to patient's RN.  Total UF removed: 1.8 liters Medications given: Retacrit 4000 units IV  Post HD weight: 63.8 kg  [facility]

## 2023-08-15 NOTE — Plan of Care (Signed)

## 2023-08-15 NOTE — Progress Notes (Signed)
  Progress Note   Patient: Allison Hill WUJ:[id number] DOB: December 18, 1970 DOA: 08/09/2023     0 DOS: the patient was seen and examined on 08/15/2023   Brief hospital course: Allison Hill is a 53 y.o. female with medical history significant of ESRD on HD MWF, refractory HTN, chronic HFpEF, chronic iron deficiency anemia admitted to hospitalist service for evaluation of chest pain, hypertensive urgency.  Patient's chest pain atypical, troponins negative.  Blood pressure improved with home regimen.  Patient lives with her daughter who does not want to take her home given high risk for falls, not safe for her HD transportation.  HD outpatient facility arranged.  TOC working on placement  On 08/13/2023/08/14/2023 patient is complaining of severe pain in her right ear that is not controlled by tylenol. The pain has not improved with IV Unasyn. CT head demonstrated mastoiditis and otitis media with swelling of the external canal. The patient was given IV Fentanyl for pain and antibiotics were expanded to include cipro PO. She will undergo dialysis on Saturday as part of her usual schedule.  Assessment and Plan: Otitis media not resolved, right Severe. Cipro added for broader coverage as the patient has not improved with unasyn alone. Fentanyl for pain control. Blood cultures x 2 drawn. Cultures of the yellow fluid coming out of external canal is cultured. It has grown rare strep pneumoniae species. Sensitivities are pending.  IV Fentanyl has been discontinued. She is receiving oral oxycodone.  Hypertensive urgency The patient's blood pressure is improved with amlodipine, losartan, imdur, terazosyn, and hydralazine. She is advised to be compliant with her antihypertensives upon discharge. However, her blood pressures have been elevated today prior to dialysis.   ESRD on dialysis (HCC) Pt went to dialysis today with removal of 1.8 L UF. She was also given 4000 U of Retacrit IV. She tolerated the procedure  well.  Chest pain Resolved. Due to elevated pressures.        Subjective: The patient is resting in bed. She continues to complain of severe ear pain.   Physical Exam: Vitals:   08/15/23 1100 08/15/23 1130 08/15/23 1152 08/15/23 1240  BP: (!) 149/88 (!) 142/95 (!) 161/78 (!) 140/89  Pulse:   82 88  Resp: 16 13 15 16   Temp:   97.9 F (36.6 C) 98.5 F (36.9 C)  TempSrc:   Oral Oral  SpO2: 100% 100% 100% 100%  Weight:   63.8 kg   Height:       Exam:  Constitutional:  The patient is awake, alert, and oriented x 3. No acute distress. Respiratory:  No increased work of breathing. No wheezes, rales, or rhonchi No tactile fremitus Cardiovascular:  Regular rate and rhythm No murmurs, ectopy, or gallups. No lateral PMI. No thrills. Abdomen:  Abdomen is soft, non-tender, non-distended No hernias, masses, or organomegaly Normoactive bowel sounds.  Musculoskeletal:  No cyanosis, clubbing, or edema Skin:  No rashes, lesions, ulcers palpation of skin: no induration or nodules Neurologic:  CN 2-12 intact Sensation all 4 extremities intact Psychiatric:  Mental status Mood, affect appropriate Orientation to person, place, time  judgment and insight appear intact  Data Reviewed:  CBC CT head  Family Communication: None available.  Disposition: Status is: Observation The patient remains OBS appropriate and will d/c before 2 midnights.  Planned Discharge Destination: Home    Time spent: 34 minutes  Author: Zaim Nitta, DO 08/15/2023 4:49 PM  For on call review [email or web address].

## 2023-08-16 DIAGNOSIS — I132 Hypertensive heart and chronic kidney disease with heart failure and with stage 5 chronic kidney disease, or end stage renal disease: Secondary | ICD-10-CM | POA: Diagnosis not present

## 2023-08-16 DIAGNOSIS — R0789 Other chest pain: Secondary | ICD-10-CM | POA: Diagnosis not present

## 2023-08-16 LAB — BASIC METABOLIC PANEL
Anion gap: 14 (ref 5–15)
BUN: 80 mg/dL — ABNORMAL HIGH (ref 6–20)
CO2: 22 mmol/L (ref 22–32)
Calcium: 7.9 mg/dL — ABNORMAL LOW (ref 8.9–10.3)
Chloride: 99 mmol/L (ref 98–111)
Creatinine, Ser: 5.59 mg/dL — ABNORMAL HIGH (ref 0.44–1.00)
GFR, Estimated: 9 mL/min — ABNORMAL LOW (ref >60)
Glucose, Bld: 122 mg/dL — ABNORMAL HIGH (ref 70–99)
Potassium: 4.8 mmol/L (ref 3.5–5.1)
Sodium: 135 mmol/L (ref 135–145)

## 2023-08-16 LAB — EAR CULTURE

## 2023-08-16 LAB — CBC
HCT: 24.9 % — ABNORMAL LOW (ref 36.0–46.0)
Hemoglobin: 8.4 g/dL — ABNORMAL LOW (ref 12.0–15.0)
MCH: 27.7 pg (ref 26.0–34.0)
MCHC: 33.7 g/dL (ref 30.0–36.0)
MCV: 82.2 fL (ref 80.0–100.0)
Platelets: 200 10*3/uL (ref 150–400)
RBC: 3.03 MIL/uL — ABNORMAL LOW (ref 3.87–5.11)
RDW: 16.6 % — ABNORMAL HIGH (ref 11.5–15.5)
WBC: 8.1 10*3/uL (ref 4.0–10.5)
nRBC: 0 % (ref 0.0–0.2)

## 2023-08-16 MED ORDER — OXYCODONE HCL 5 MG PO TABS
5.0000 mg | ORAL_TABLET | Freq: Once | ORAL | Status: AC
  Administered 2023-08-16: 5 mg via ORAL
  Filled 2023-08-16: qty 1

## 2023-08-16 MED ORDER — SENNA 8.6 MG PO TABS
1.0000 | ORAL_TABLET | Freq: Every evening | ORAL | Status: DC | PRN
  Administered 2023-08-18: 8.6 mg via ORAL
  Filled 2023-08-16 (×2): qty 1

## 2023-08-16 MED ORDER — PREDNISONE 20 MG PO TABS
20.0000 mg | ORAL_TABLET | Freq: Once | ORAL | Status: AC
  Administered 2023-08-16: 20 mg via ORAL
  Filled 2023-08-16: qty 1

## 2023-08-16 NOTE — Progress Notes (Signed)
 [facility]  ROUNDING NOTE   Subjective:   Patient in room, resting comfortably. Patient denies chest pain, denies nausea, denies diarrhea. Patient endorses tolerating dialysis without complaints of cramping.  Renal navigator found placement for dialysis outpatient. Currently waiting for rehab facility placement.  Objective:  Vital signs in last 24 hours:  Temp:  [97.8 F (36.6 C)-98.8 F (37.1 C)] 97.8 F (36.6 C) (03/16 1610) Pulse Rate:  [87-95] 87 (03/16 1610) Resp:  [18-19] 18 (03/16 1151) BP: (114-153)/(79-100) 151/100 (03/16 1610) SpO2:  [98 %-100 %] 100 % (03/16 1610)  Weight change: 0.238 kg Filed Weights   08/15/23 0515 08/15/23 0751 08/15/23 1152  Weight: 65.9 kg 66.1 kg 63.8 kg    Intake/Output: I/O last 3 completed shifts: In: 300 [IV Piggyback:300] Out: 1800 [Other:1800]   Intake/Output this shift:  No intake/output data recorded.  Physical Exam: General: NAD,   Head: Normocephalic, atraumatic. Moist oral mucosal membranes  Eyes: Anicteric, PERRL  Neck: Supple, trachea midline  Lungs:  Clear to auscultation  Heart: Regular rate and rhythm  Abdomen:  Soft, nontender,   Extremities:  No peripheral edema.  Neurologic: Nonfocal, moving all four extremities  Skin: No lesions  Access: Rt Chest Permcath    Basic Metabolic Panel: Recent Labs  Lab 08/11/23 0413 08/12/23 0506 08/13/23 0502 08/15/23 0330 08/16/23 0311  NA 139 138 134* 134* 135  K 3.9 3.9 4.0 4.5 4.8  CL 101 101 98 99 99  CO2 25 23 26 22 22   GLUCOSE 84 87 86 104* 122*  BUN 62* 82* 58* 104* 80*  CREATININE 5.41* 6.91* 5.08* 8.10* 5.59*  CALCIUM 9.0 9.2 9.3 7.4* 7.9*  PHOS 5.3*  --   --   --   --     Liver Function Tests: Recent Labs  Lab 08/11/23 0413  ALBUMIN 3.2*   No results for input(s): "LIPASE", "AMYLASE" in the last 168 hours. No results for input(s): "AMMONIA" in the last 168 hours.  CBC: Recent Labs  Lab 08/10/23 0238 08/12/23 0506 08/13/23 0502  08/14/23 0531 08/15/23 0330 08/16/23 0311  WBC 5.5  --   --  9.4 8.8 8.1  NEUTROABS  --   --   --  7.5  --   --   HGB 8.7* 8.4* 8.8* 9.8* 8.2* 8.4*  HCT 25.9* 25.5* 26.7* 29.4* 24.3* 24.9*  MCV 82.2  --   --  81.4 82.7 82.2  PLT 192  --   --  192 194 200    Cardiac Enzymes: No results for input(s): "CKTOTAL", "CKMB", "CKMBINDEX", "TROPONINI" in the last 168 hours.  BNP: Invalid input(s): "POCBNP"  CBG: No results for input(s): "GLUCAP" in the last 168 hours.  Microbiology: Results for orders placed or performed during the hospital encounter of 08/09/23  Resp panel by RT-PCR (RSV, Flu A&B, Covid) Anterior Nasal Swab     Status: None   Collection Time: 08/14/23  3:44 AM   Specimen: Anterior Nasal Swab  Result Value Ref Range Status   SARS Coronavirus 2 by RT PCR NEGATIVE NEGATIVE Final    Comment: (NOTE) SARS-CoV-2 target nucleic acids are NOT DETECTED.  The SARS-CoV-2 RNA is generally detectable in upper respiratory specimens during the acute phase of infection. The lowest concentration of SARS-CoV-2 viral copies this assay can detect is 138 copies/mL. A negative result does not preclude SARS-Cov-2 infection and should not be used as the sole basis for treatment or other patient management decisions. A negative result may occur with  improper specimen collection/handling, submission of specimen other than nasopharyngeal swab, presence of viral mutation(s) within the areas targeted by this assay, and inadequate number of viral copies(<138 copies/mL). A negative result must be combined with clinical observations, patient history, and epidemiological information. The expected result is Negative.  Fact Sheet for Patients:  BloggerCourse.com  Fact Sheet for Healthcare Providers:  SeriousBroker.it  This test is no t yet approved or cleared by the [place] FDA and  has been authorized for detection and/or diagnosis of  SARS-CoV-2 by FDA under an Emergency Use Authorization (EUA). This EUA will remain  in effect (meaning this test can be used) for the duration of the COVID-19 declaration under Section 564(b)(1) of the Act, 21 U.S.C.section 360bbb-3(b)(1), unless the authorization is terminated  or revoked sooner.       Influenza A by PCR NEGATIVE NEGATIVE Final   Influenza B by PCR NEGATIVE NEGATIVE Final    Comment: (NOTE) The Xpert Xpress SARS-CoV-2/FLU/RSV plus assay is intended as an aid in the diagnosis of influenza from Nasopharyngeal swab specimens and should not be used as a sole basis for treatment. Nasal washings and aspirates are unacceptable for Xpert Xpress SARS-CoV-2/FLU/RSV testing.  Fact Sheet for Patients: BloggerCourse.com  Fact Sheet for Healthcare Providers: SeriousBroker.it  This test is not yet approved or cleared by the [place] FDA and has been authorized for detection and/or diagnosis of SARS-CoV-2 by FDA under an Emergency Use Authorization (EUA). This EUA will remain in effect (meaning this test can be used) for the duration of the COVID-19 declaration under Section 564(b)(1) of the Act, 21 U.S.C. section 360bbb-3(b)(1), unless the authorization is terminated or revoked.     Resp Syncytial Virus by PCR NEGATIVE NEGATIVE Final    Comment: (NOTE) Fact Sheet for Patients: BloggerCourse.com  Fact Sheet for Healthcare Providers: SeriousBroker.it  This test is not yet approved or cleared by the [place] FDA and has been authorized for detection and/or diagnosis of SARS-CoV-2 by FDA under an Emergency Use Authorization (EUA). This EUA will remain in effect (meaning this test can be used) for the duration of the COVID-19 declaration under Section 564(b)(1) of the Act, 21 U.S.C. section 360bbb-3(b)(1), unless the authorization is terminated  or revoked.  Performed at [facility], [address] [place], [place] [address]   Respiratory (~20 pathogens) panel by PCR     Status: None   Collection Time: 08/14/23  3:44 AM   Specimen: Nasopharyngeal Swab; Respiratory  Result Value Ref Range Status   Adenovirus NOT DETECTED NOT DETECTED Final   Coronavirus 229E NOT DETECTED NOT DETECTED Final    Comment: (NOTE) The Coronavirus on the Respiratory Panel, DOES NOT test for the novel  Coronavirus (2019 nCoV)    Coronavirus HKU1 NOT DETECTED NOT DETECTED Final   Coronavirus NL63 NOT DETECTED NOT DETECTED Final   Coronavirus OC43 NOT DETECTED NOT DETECTED Final   Metapneumovirus NOT DETECTED NOT DETECTED Final   Rhinovirus / Enterovirus NOT DETECTED NOT DETECTED Final   Influenza A NOT DETECTED NOT DETECTED Final   Influenza B NOT DETECTED NOT DETECTED Final   Parainfluenza Virus 1 NOT DETECTED NOT DETECTED Final   Parainfluenza Virus 2 NOT DETECTED NOT DETECTED Final   Parainfluenza Virus 3 NOT DETECTED NOT DETECTED Final   Parainfluenza Virus 4 NOT DETECTED NOT DETECTED Final   Respiratory Syncytial Virus NOT DETECTED NOT DETECTED Final   Bordetella pertussis NOT DETECTED NOT DETECTED Final   Bordetella Parapertussis NOT DETECTED NOT DETECTED Final  Chlamydophila pneumoniae NOT DETECTED NOT DETECTED Final   Mycoplasma pneumoniae NOT DETECTED NOT DETECTED Final    Comment: Performed at [facility], [address] [place], [place] [address]  Culture, blood (Routine X 2) w Reflex to ID Panel     Status: None (Preliminary result)   Collection Time: 08/14/23  9:19 AM   Specimen: BLOOD  Result Value Ref Range Status   Specimen Description BLOOD BLOOD RIGHT HAND  Final   Special Requests   Final    BOTTLES DRAWN AEROBIC AND ANAEROBIC Blood Culture adequate volume   Culture   Final    NO GROWTH 2 DAYS Performed at [facility], [address] [place], [place] [address]    Report Status PENDING   Incomplete  Culture, blood (Routine X 2) w Reflex to ID Panel     Status: None (Preliminary result)   Collection Time: 08/14/23  9:19 AM   Specimen: BLOOD  Result Value Ref Range Status   Specimen Description BLOOD BLOOD LEFT HAND  Final   Special Requests   Final    BOTTLES DRAWN AEROBIC AND ANAEROBIC Blood Culture adequate volume   Culture   Final    NO GROWTH 2 DAYS Performed at [facility], [address], [place], [place] [address]    Report Status PENDING  Incomplete  Ear culture     Status: None   Collection Time: 08/14/23 10:11 AM   Specimen: Ear; Other  Result Value Ref Range Status   Specimen Description   Final    EAR Performed at [facility], [address] [place], [place] [address]    Special Requests   Final    RIGHT EAR Performed at [facility], [address] [place], [place] [address]    Culture RARE STREPTOCOCCUS PNEUMONIAE  Final   Report Status 08/16/2023 FINAL  Final   Organism ID, Bacteria STREPTOCOCCUS PNEUMONIAE  Final      Susceptibility   Streptococcus pneumoniae - MIC*    ERYTHROMYCIN <=0.12 SENSITIVE Sensitive     LEVOFLOXACIN 1 SENSITIVE Sensitive     VANCOMYCIN 0.5 SENSITIVE Sensitive     PENICILLIN (meningitis) <=0.06 SENSITIVE Sensitive     PENO - penicillin <=0.06      PENICILLIN (non-meningitis) <=0.06 SENSITIVE Sensitive     PENICILLIN (oral) <=0.06 SENSITIVE Sensitive     CEFTRIAXONE (non-meningitis) <=0.12 SENSITIVE Sensitive     CEFTRIAXONE (meningitis) <=0.12 SENSITIVE Sensitive     * RARE STREPTOCOCCUS PNEUMONIAE    Coagulation Studies: No results for input(s): "LABPROT", "INR" in the last 72 hours.  Urinalysis: No results for input(s): "COLORURINE", "LABSPEC", "PHURINE", "GLUCOSEU", "HGBUR", "BILIRUBINUR", "KETONESUR", "PROTEINUR", "UROBILINOGEN", "NITRITE", "LEUKOCYTESUR" in the last 72 hours.  Invalid input(s): "APPERANCEUR"    Imaging: No results found.   Medications:      acetaminophen  1,000 mg Oral Q6H   amLODipine  10 mg Oral Daily   aspirin EC  81 mg Oral Daily   atorvastatin  40 mg Oral Daily   Chlorhexidine Gluconate Cloth  6 each Topical Q0600   cinacalcet  30 mg Oral Q supper   ciprofloxacin  500 mg Oral QPM   epoetin alfa-epbx (RETACRIT) injection  4,000 Units Intravenous Q T,Th,Sat-1800   fluticasone  1 spray Each Nare Daily   gabapentin  200 mg Oral BID   heparin  5,000 Units Subcutaneous Q12H   hydrALAZINE  100 mg Oral TID   hydrOXYzine  25 mg Oral TID  isosorbide mononitrate  120 mg Oral Daily   labetalol  600 mg Oral BID   losartan  100 mg Oral Daily   predniSONE  10 mg Oral 3 x daily with food   [START ON 08/17/2023] predniSONE  10 mg Oral 4X daily taper   predniSONE  20 mg Oral Nightly   sevelamer carbonate  1,600 mg Oral TID with meals   terazosin  2 mg Oral BID   ondansetron (ZOFRAN) IV, oxyCODONE, senna  Assessment/ Plan:  [name] is a 53 y.o.  female  with past medical conditions including hypertension, CHF, anemia, and end-stage renal disease on hemodialysis, who was admitted to [facility] on 08/09/2023 for Hypertensive urgency [I16.0] Chest pain [R07.9] Nonspecific chest pain [R07.9] Anemia due to chronic kidney disease, on chronic dialysis (HCC) [N18.6, D63.1, Z99.2]    1.  End stage renal disease on hemodialysis.  Patient tolerated dialysis yesterday, 2L UF Renal navigator has confirmed outpatient dialysis clinic at [facility] on a TTS schedule.    2. Anemia of chronic kidney disease Recent Labs           Lab Results  Component Value Date    HGB 8.4 (L) 08/15/2023      Continue ESA with dialysis.  3. Hypertensive urgency, on admission. Blood pressure control is acceptable.  Currently managed with amlodipine, hydralazine, isosorbide, labetalol, losartan and terazosin.        BP 151/100   4. Secondary Hyperparathyroidism: with outpatient labs: None available Recent Labs           Lab Results   Component Value Date    PTH 1,248 (H) 08/11/2023    CALCIUM 7.9 08/16/2023    PHOS 5.3 (H) 08/11/2023      Will continue to monitor bone minerals. PTH 1248 Continue Cinacalcet and monitor calcium Continue binders-currently on sevelamer carbonate.   LOS: 0 Paydon Carll Tonny Bollman 3/16/20254:36 PM

## 2023-08-16 NOTE — Plan of Care (Signed)
  Problem: Activity: Goal: Ability to tolerate increased activity will improve Outcome: Progressing   Problem: Cardiac: Goal: Ability to achieve and maintain adequate cardiovascular perfusion will improve Outcome: Progressing   Problem: Health Behavior/Discharge Planning: Goal: Ability to manage health-related needs will improve Outcome: Progressing   Problem: Nutrition: Goal: Adequate nutrition will be maintained Outcome: Progressing

## 2023-08-16 NOTE — Plan of Care (Signed)

## 2023-08-16 NOTE — TOC Progression Note (Addendum)
 Transition of Care [facility]) - Progression Note    Patient Details  Name: Allison Hill MRN: [id number] Date of Birth: August 19, 1970  Transition of Care [facility]) CM/SW Contact  Liliana Cline, LCSW Phone Number: 08/16/2023, 12:10 PM  Clinical Narrative:    CSW spoke with patient, presented bed offers at [facility]. [facility] chose [facility] as it is closer to her daughter's home. Checked with DO, patient is medically ready - started auth in [facility] portal.   Expected Discharge Plan:  (TBD) Barriers to Discharge: Continued Medical Work up  Expected Discharge Plan and Services     Post Acute Care Choice:  (TBD) Living arrangements for the past 2 months: Single Family Home                                       Social Determinants of Health (SDOH) Interventions SDOH Screenings   Food Insecurity: No Food Insecurity (08/10/2023)  Recent Concern: Food Insecurity - High Risk (07/17/2023)   Received from [facility]  Housing: Low Risk  (08/10/2023)  Recent Concern: Housing - High Risk (07/22/2023)   Received from [facility]  Transportation Needs: Unmet Transportation Needs (08/10/2023)  Utilities: Not At Risk (08/10/2023)  Recent Concern: Utilities - Medium Risk (07/17/2023)   Received from [facility]  Financial Resource Strain: Patient Declined (01/03/2023)   Received from [facility]  Social Connections: Unknown (08/10/2023)  Tobacco Use: High Risk (08/10/2023)    Readmission Risk Interventions     No data to display

## 2023-08-16 NOTE — Progress Notes (Signed)
 Progress Note   Patient: Allison Hill FUX:[id number] DOB: Oct 02, 1970 DOA: 08/09/2023     0 DOS: the patient was seen and examined on 08/16/2023   Brief hospital course: Allison Hill is a 53 y.o. female with medical history significant of ESRD on HD MWF, refractory HTN, chronic HFpEF, chronic iron deficiency anemia admitted to hospitalist service for evaluation of chest pain, hypertensive urgency.  Patient's chest pain atypical, troponins negative.  Blood pressure improved with home regimen.  Patient lives with her daughter who does not want to take her home given high risk for falls, not safe for her HD transportation.  HD outpatient facility arranged.  TOC working on placement  On 08/13/2023/08/14/2023 patient is complaining of severe pain in her right ear that is not controlled by tylenol. The pain has not improved with IV Unasyn. CT head demonstrated mastoiditis and otitis media with swelling of the external canal. The patient was given IV Fentanyl for pain and antibiotics were expanded to include cipro PO. She will undergo dialysis on Saturday as part of her usual schedule.  Plan is for the patient to go to rehab following discharge. TOC has been consulted and are seeking insurance approval.  Assessment and Plan: Otitis media not resolved, right Severe. Cipro added for broader coverage as the patient has not improved with unasyn alone. Fentanyl for pain control. Blood cultures x 2 drawn. Cultures of the yellow fluid coming out of external canal is cultured. It has grown rare strep pneumoniae species. Sensitivities are pending.  IV Fentanyl has been discontinued. She is receiving oral oxycodone.  Hypertensive urgency The patient's blood pressure is improved with amlodipine, losartan, imdur, terazosyn, and hydralazine. She is advised to be compliant with her antihypertensives upon discharge. However, her blood pressures have been elevated today prior to dialysis.   ESRD on dialysis  (HCC) Pt went to dialysis today with removal of 1.8 L UF. She was also given 4000 U of Retacrit IV. She tolerated the procedure well.  Chest pain Resolved. Due to elevated pressures.     Subjective: The patient is resting in bed. She is not complaining of ear pain.  Physical Exam: Vitals:   08/16/23 0847 08/16/23 0927 08/16/23 1043 08/16/23 1151  BP: (!) 145/85 (!) 153/83 128/85 129/87  Pulse: 89 95  95  Resp:  18  18  Temp: 98.6 F (37 C)   98.5 F (36.9 C)  TempSrc: Oral   Oral  SpO2: 100% 99%  98%  Weight:      Height:       Exam:  Constitutional:  The patient is awake, alert, and oriented x 3. No acute distress. Respiratory:  No increased work of breathing. No wheezes, rales, or rhonchi No tactile fremitus Cardiovascular:  Regular rate and rhythm No murmurs, ectopy, or gallups. No lateral PMI. No thrills. Abdomen:  Abdomen is soft, non-tender, non-distended No hernias, masses, or organomegaly Normoactive bowel sounds.  Musculoskeletal:  No cyanosis, clubbing, or edema Skin:  No rashes, lesions, ulcers palpation of skin: no induration or nodules Neurologic:  CN 2-12 intact Sensation all 4 extremities intact Psychiatric:  Mental status Mood, affect appropriate Orientation to person, place, time  judgment and insight appear intact  Data Reviewed:  CBC CT head  Family Communication: None available.  Disposition: Status is: Observation The patient remains OBS appropriate and will d/c before 2 midnights.  Planned Discharge Destination: Home    Time spent: 34 minutes  Author: Brieana Shimmin, DO 08/16/2023 2:26 PM  For on  call review [email or web address].

## 2023-08-16 NOTE — Progress Notes (Signed)
 Physical Therapy Treatment Patient Details Name: Allison Hill MRN: [id number] DOB: [birth year] Today's Date: 08/16/2023   History of Present Illness Pt is a 53 y.o. female with medical history significant of ESRD on HD MWF, refractory HTN, chronic HFpEF, chronic iron deficiency anemia presented with new onset of chest pain, workup for hypertensive emergency.    PT Comments  Pt excited to walk. To EOB with features and cga x 1. Steady in sitting.  Stands to RW with cga x 1 and is able to complete x 1 lap on unit with cga x 1.  Pt with cues for hand placements and to keep feet in walker box.  She is externally rotated at hips and keeps wide BOS especially when turning so feet often are outside of box and occasionally hit walker legs.  She stated she uses rollator at home and prefers it to RW.  Opts to remain up in chair after gait.  She does endorse some dizziness today but does not worsen with time and does not impact session.    If plan is discharge home, recommend the following: A little help with walking and/or transfers;A little help with bathing/dressing/bathroom;Assistance with cooking/housework;Assist for transportation;Help with stairs or ramp for entrance   Can travel by private vehicle        Equipment Recommendations       Recommendations for Other Services       Precautions / Restrictions Precautions Precautions: Fall Restrictions Weight Bearing Restrictions Per Provider Order: No     Mobility  Bed Mobility Overal bed mobility: Modified Independent               Patient Response: Cooperative  Transfers Overall transfer level: Needs assistance Equipment used: Rolling walker (2 wheels) Transfers: Sit to/from Stand Sit to Stand: Contact guard assist                Ambulation/Gait Ambulation/Gait assistance: Contact guard assist, Min assist Gait Distance (Feet): 200 Feet Assistive device: Rolling walker (2 wheels) Gait Pattern/deviations:  Step-through pattern, Decreased step length - right, Decreased step length - left Gait velocity: dec     General Gait Details: overall does fair.  externally rotated at hips occasionally feet going outside walker box and hitting walker legs   Stairs             Wheelchair Mobility     Tilt Bed Tilt Bed Patient Response: Cooperative  Modified Rankin (Stroke Patients Only)       Balance Overall balance assessment: Needs assistance Sitting-balance support: Feet supported Sitting balance-Leahy Scale: Good     Standing balance support: Bilateral upper extremity supported, Reliant on assistive device for balance, During functional activity Standing balance-Leahy Scale: Fair                              Hotel manager: Impaired  Cognition Arousal: Alert Behavior During Therapy: WFL for tasks assessed/performed   PT - Cognitive impairments: No apparent impairments                         Following commands: Intact Following commands impaired: Only follows one step commands consistently    Cueing Cueing Techniques: Verbal cues, Gestural cues, Tactile cues  Exercises      General Comments        Pertinent Vitals/Pain Pain Assessment Pain Assessment: No/denies pain    Home Living  Prior Function            PT Goals (current goals can now be found in the care plan section) Progress towards PT goals: Progressing toward goals    Frequency    Min 2X/week      PT Plan      Co-evaluation              AM-PAC PT "6 Clicks" Mobility   Outcome Measure  Help needed turning from your back to your side while in a flat bed without using bedrails?: None Help needed moving from lying on your back to sitting on the side of a flat bed without using bedrails?: A Little Help needed moving to and from a bed to a chair (including a wheelchair)?: A Little Help needed standing  up from a chair using your arms (e.g., wheelchair or bedside chair)?: A Little Help needed to walk in hospital room?: A Little Help needed climbing 3-5 steps with a railing? : A Little 6 Click Score: 19    End of Session Equipment Utilized During Treatment: Gait belt Activity Tolerance: Patient tolerated treatment well Patient left: in chair;with chair alarm set;with call bell/phone within reach Nurse Communication: Mobility status PT Visit Diagnosis: Other abnormalities of gait and mobility (R26.89);Difficulty in walking, not elsewhere classified (R26.2);Muscle weakness (generalized) (M62.81)     Time: 2536-6440 PT Time Calculation (min) (ACUTE ONLY): 10 min  Charges:    $Gait Training: 8-22 mins PT General Charges $$ ACUTE PT VISIT: 1 Visit                   Danielle Dess, PTA 08/16/23, 3:41 PM

## 2023-08-17 ENCOUNTER — Observation Stay

## 2023-08-17 DIAGNOSIS — R0789 Other chest pain: Secondary | ICD-10-CM | POA: Diagnosis not present

## 2023-08-17 DIAGNOSIS — R1011 Right upper quadrant pain: Secondary | ICD-10-CM | POA: Diagnosis not present

## 2023-08-17 DIAGNOSIS — I132 Hypertensive heart and chronic kidney disease with heart failure and with stage 5 chronic kidney disease, or end stage renal disease: Secondary | ICD-10-CM | POA: Diagnosis not present

## 2023-08-17 NOTE — Progress Notes (Signed)
 [facility]  ROUNDING NOTE   Subjective:   Patient due for hemodialysis treatment again tomorrow. Awaiting placement in rehab facility.  Objective:  Vital signs in last 24 hours:  Temp:  [97.6 F (36.4 C)-98.6 F (37 C)] 98.2 F (36.8 C) (03/17 1517) Pulse Rate:  [85-106] 106 (03/17 1517) Resp:  [16-20] 18 (03/17 1517) BP: (117-154)/(77-96) 117/79 (03/17 1517) SpO2:  [95 %-100 %] 98 % (03/17 1517)  Weight change:  Filed Weights   08/15/23 0515 08/15/23 0751 08/15/23 1152  Weight: 65.9 kg 66.1 kg 63.8 kg    Intake/Output: I/O last 3 completed shifts: In: 560 [P.O.:360; IV Piggyback:200] Out: -    Intake/Output this shift:  Total I/O In: 120 [P.O.:120] Out: -   Physical Exam: General: NAD  Head: Normocephalic, atraumatic. Moist oral mucosal membranes  Eyes: Anicteric  Neck: Supple, trachea midline  Lungs:  Clear to auscultation  Heart: Regular rate and rhythm  Abdomen:  Soft, nontender,   Extremities: No peripheral edema.  Neurologic: Nonfocal, moving all four extremities  Skin: No lesions  Access: Rt Chest Permcath    Basic Metabolic Panel: Recent Labs  Lab 08/11/23 0413 08/12/23 0506 08/13/23 0502 08/15/23 0330 08/16/23 0311  NA 139 138 134* 134* 135  K 3.9 3.9 4.0 4.5 4.8  CL 101 101 98 99 99  CO2 25 23 26 22 22   GLUCOSE 84 87 86 104* 122*  BUN 62* 82* 58* 104* 80*  CREATININE 5.41* 6.91* 5.08* 8.10* 5.59*  CALCIUM 9.0 9.2 9.3 7.4* 7.9*  PHOS 5.3*  --   --   --   --     Liver Function Tests: Recent Labs  Lab 08/11/23 0413  ALBUMIN 3.2*   No results for input(s): "LIPASE", "AMYLASE" in the last 168 hours. No results for input(s): "AMMONIA" in the last 168 hours.  CBC: Recent Labs  Lab 08/12/23 0506 08/13/23 0502 08/14/23 0531 08/15/23 0330 08/16/23 0311  WBC  --   --  9.4 8.8 8.1  NEUTROABS  --   --  7.5  --   --   HGB 8.4* 8.8* 9.8* 8.2* 8.4*  HCT 25.5* 26.7* 29.4* 24.3* 24.9*  MCV  --   --  81.4 82.7 82.2  PLT   --   --  192 194 200    Cardiac Enzymes: No results for input(s): "CKTOTAL", "CKMB", "CKMBINDEX", "TROPONINI" in the last 168 hours.  BNP: Invalid input(s): "POCBNP"  CBG: No results for input(s): "GLUCAP" in the last 168 hours.  Microbiology: Results for orders placed or performed during the hospital encounter of 08/09/23  Resp panel by RT-PCR (RSV, Flu A&B, Covid) Anterior Nasal Swab     Status: None   Collection Time: 08/14/23  3:44 AM   Specimen: Anterior Nasal Swab  Result Value Ref Range Status   SARS Coronavirus 2 by RT PCR NEGATIVE NEGATIVE Final    Comment: (NOTE) SARS-CoV-2 target nucleic acids are NOT DETECTED.  The SARS-CoV-2 RNA is generally detectable in upper respiratory specimens during the acute phase of infection. The lowest concentration of SARS-CoV-2 viral copies this assay can detect is 138 copies/mL. A negative result does not preclude SARS-Cov-2 infection and should not be used as the sole basis for treatment or other patient management decisions. A negative result may occur with  improper specimen collection/handling, submission of specimen other than nasopharyngeal swab, presence of viral mutation(s) within the areas targeted by this assay, and inadequate number of viral copies(<138 copies/mL). A negative result must  be combined with clinical observations, patient history, and epidemiological information. The expected result is Negative.  Fact Sheet for Patients:  BloggerCourse.com  Fact Sheet for Healthcare Providers:  SeriousBroker.it  This test is no t yet approved or cleared by the [place] FDA and  has been authorized for detection and/or diagnosis of SARS-CoV-2 by FDA under an Emergency Use Authorization (EUA). This EUA will remain  in effect (meaning this test can be used) for the duration of the COVID-19 declaration under Section 564(b)(1) of the Act, 21 U.S.C.section 360bbb-3(b)(1),  unless the authorization is terminated  or revoked sooner.       Influenza A by PCR NEGATIVE NEGATIVE Final   Influenza B by PCR NEGATIVE NEGATIVE Final    Comment: (NOTE) The Xpert Xpress SARS-CoV-2/FLU/RSV plus assay is intended as an aid in the diagnosis of influenza from Nasopharyngeal swab specimens and should not be used as a sole basis for treatment. Nasal washings and aspirates are unacceptable for Xpert Xpress SARS-CoV-2/FLU/RSV testing.  Fact Sheet for Patients: BloggerCourse.com  Fact Sheet for Healthcare Providers: SeriousBroker.it  This test is not yet approved or cleared by the [place] FDA and has been authorized for detection and/or diagnosis of SARS-CoV-2 by FDA under an Emergency Use Authorization (EUA). This EUA will remain in effect (meaning this test can be used) for the duration of the COVID-19 declaration under Section 564(b)(1) of the Act, 21 U.S.C. section 360bbb-3(b)(1), unless the authorization is terminated or revoked.     Resp Syncytial Virus by PCR NEGATIVE NEGATIVE Final    Comment: (NOTE) Fact Sheet for Patients: BloggerCourse.com  Fact Sheet for Healthcare Providers: SeriousBroker.it  This test is not yet approved or cleared by the [place] FDA and has been authorized for detection and/or diagnosis of SARS-CoV-2 by FDA under an Emergency Use Authorization (EUA). This EUA will remain in effect (meaning this test can be used) for the duration of the COVID-19 declaration under Section 564(b)(1) of the Act, 21 U.S.C. section 360bbb-3(b)(1), unless the authorization is terminated or revoked.  Performed at [facility], [address] [place], [place] [address]   Respiratory (~20 pathogens) panel by PCR     Status: None   Collection Time: 08/14/23  3:44 AM   Specimen: Nasopharyngeal Swab; Respiratory  Result Value Ref  Range Status   Adenovirus NOT DETECTED NOT DETECTED Final   Coronavirus 229E NOT DETECTED NOT DETECTED Final    Comment: (NOTE) The Coronavirus on the Respiratory Panel, DOES NOT test for the novel  Coronavirus (2019 nCoV)    Coronavirus HKU1 NOT DETECTED NOT DETECTED Final   Coronavirus NL63 NOT DETECTED NOT DETECTED Final   Coronavirus OC43 NOT DETECTED NOT DETECTED Final   Metapneumovirus NOT DETECTED NOT DETECTED Final   Rhinovirus / Enterovirus NOT DETECTED NOT DETECTED Final   Influenza A NOT DETECTED NOT DETECTED Final   Influenza B NOT DETECTED NOT DETECTED Final   Parainfluenza Virus 1 NOT DETECTED NOT DETECTED Final   Parainfluenza Virus 2 NOT DETECTED NOT DETECTED Final   Parainfluenza Virus 3 NOT DETECTED NOT DETECTED Final   Parainfluenza Virus 4 NOT DETECTED NOT DETECTED Final   Respiratory Syncytial Virus NOT DETECTED NOT DETECTED Final   Bordetella pertussis NOT DETECTED NOT DETECTED Final   Bordetella Parapertussis NOT DETECTED NOT DETECTED Final   Chlamydophila pneumoniae NOT DETECTED NOT DETECTED Final   Mycoplasma pneumoniae NOT DETECTED NOT DETECTED Final    Comment: Performed at [facility], [address],  [place], [place] [address]  Culture, blood (Routine X 2) w Reflex to ID Panel     Status: None (Preliminary result)   Collection Time: 08/14/23  9:19 AM   Specimen: BLOOD  Result Value Ref Range Status   Specimen Description BLOOD BLOOD RIGHT HAND  Final   Special Requests   Final    BOTTLES DRAWN AEROBIC AND ANAEROBIC Blood Culture adequate volume   Culture   Final    NO GROWTH 3 DAYS Performed at [facility], [address] [place], [place] [address]    Report Status PENDING  Incomplete  Culture, blood (Routine X 2) w Reflex to ID Panel     Status: None (Preliminary result)   Collection Time: 08/14/23  9:19 AM   Specimen: BLOOD  Result Value Ref Range Status   Specimen Description BLOOD BLOOD LEFT HAND  Final   Special Requests    Final    BOTTLES DRAWN AEROBIC AND ANAEROBIC Blood Culture adequate volume   Culture   Final    NO GROWTH 3 DAYS Performed at [facility], [address], [place], [place] [address]    Report Status PENDING  Incomplete  Ear culture     Status: None   Collection Time: 08/14/23 10:11 AM   Specimen: Ear; Other  Result Value Ref Range Status   Specimen Description   Final    EAR Performed at [facility], [address], [place], [place] [address]    Special Requests   Final    RIGHT EAR Performed at [facility], [address] [place], [place] [address]    Culture RARE STREPTOCOCCUS PNEUMONIAE  Final   Report Status 08/16/2023 FINAL  Final   Organism ID, Bacteria STREPTOCOCCUS PNEUMONIAE  Final      Susceptibility   Streptococcus pneumoniae - MIC*    ERYTHROMYCIN <=0.12 SENSITIVE Sensitive     LEVOFLOXACIN 1 SENSITIVE Sensitive     VANCOMYCIN 0.5 SENSITIVE Sensitive     PENICILLIN (meningitis) <=0.06 SENSITIVE Sensitive     PENO - penicillin <=0.06      PENICILLIN (non-meningitis) <=0.06 SENSITIVE Sensitive     PENICILLIN (oral) <=0.06 SENSITIVE Sensitive     CEFTRIAXONE (non-meningitis) <=0.12 SENSITIVE Sensitive     CEFTRIAXONE (meningitis) <=0.12 SENSITIVE Sensitive     * RARE STREPTOCOCCUS PNEUMONIAE    Coagulation Studies: No results for input(s): "LABPROT", "INR" in the last 72 hours.  Urinalysis: No results for input(s): "COLORURINE", "LABSPEC", "PHURINE", "GLUCOSEU", "HGBUR", "BILIRUBINUR", "KETONESUR", "PROTEINUR", "UROBILINOGEN", "NITRITE", "LEUKOCYTESUR" in the last 72 hours.  Invalid input(s): "APPERANCEUR"    Imaging: US Abdomen Limited RUQ (LIVER/GB) Result Date: 08/17/2023 CLINICAL DATA:  Abdominal pain. EXAM: ULTRASOUND ABDOMEN LIMITED RIGHT UPPER QUADRANT COMPARISON:  Abdominal CT 02/21/2023 FINDINGS: Gallbladder: The gallbladder is partially contracted. No gallbladder wall thickening, gallstones or sonographic Murphy sign.  Common bile duct: Diameter: 5 mm.  No intrahepatic biliary dilatation. Liver: No focal lesion identified. Within normal limits in parenchymal echogenicity. Portal vein is patent on color Doppler imaging with normal direction of blood flow towards the liver. Other: Echogenic right kidney noted in this patient reported to be on hemodialysis. IMPRESSION: 1. No acute findings or explanation for the patient's symptoms. 2. Incomplete gallbladder distension without focal abnormality. 3. Echogenic right kidney in this patient reported to be on hemodialysis. Electronically Signed   By: Carey Bullocks M.D.   On: 08/17/2023 11:41     Medications:     acetaminophen  1,000 mg Oral Q6H  amLODipine  10 mg Oral Daily   aspirin EC  81 mg Oral Daily   atorvastatin  40 mg Oral Daily   Chlorhexidine Gluconate Cloth  6 each Topical Q0600   cinacalcet  30 mg Oral Q supper   ciprofloxacin  500 mg Oral QPM   epoetin alfa-epbx (RETACRIT) injection  4,000 Units Intravenous Q T,Th,Sat-1800   fluticasone  1 spray Each Nare Daily   gabapentin  200 mg Oral BID   heparin  5,000 Units Subcutaneous Q12H   hydrALAZINE  100 mg Oral TID   hydrOXYzine  25 mg Oral TID   isosorbide mononitrate  120 mg Oral Daily   labetalol  600 mg Oral BID   losartan  100 mg Oral Daily   predniSONE  10 mg Oral 4X daily taper   sevelamer carbonate  1,600 mg Oral TID with meals   terazosin  2 mg Oral BID   ondansetron (ZOFRAN) IV, oxyCODONE, senna  Assessment/ Plan:  Ms. Ismahan Lippman is a 53 y.o.  female  with past medical conditions including hypertension, CHF, anemia, and end-stage renal disease on hemodialysis, who was admitted to [facility] on 08/09/2023 for Hypertensive urgency [I16.0] Chest pain [R07.9] Nonspecific chest pain [R07.9] Anemia due to chronic kidney disease, on chronic dialysis (HCC) [N18.6, D63.1, Z99.2]    1.  End stage renal disease on hemodialysis.  Last treatment was on Saturday.  We will plan for hemodialysis  treatment again tomorrow.   2. Anemia of chronic kidney disease Recent Labs           Lab Results  Component Value Date    HGB 8.4 (L) 08/15/2023      Hemoglobin remains low at 8.4.  Continue Epogen 4000's IV with dialysis.  3. Hypertensive urgency, on admission.  Patient with reasonable blood pressure control now.  Continue amlodipine, hydralazine, labetalol, losartan, and terazosin   4. Secondary Hyperparathyroidism: with outpatient labs: None available Recent Labs           Lab Results  Component Value Date    PTH 1,248 (H) 08/11/2023    CALCIUM 7.9 08/16/2023    PHOS 5.3 (H) 08/11/2023    Continue Sensipar and Renvela.   LOS: 0 Sonoma Firkus 3/17/20255:20 PM

## 2023-08-17 NOTE — TOC Progression Note (Signed)
 Transition of Care [facility]) - Progression Note    Patient Details  Name: Allison Hill MRN: [id number] Date of Birth: [birth year]  [facility]) CM/SW Contact  Truddie Hidden, RN Phone Number: 08/17/2023, 9:59 AM  Clinical Narrative:    Per TOC assistant, Vesta Mixer had to be resubmitted. Auth resubmitted via Navi and is pending.    Expected Discharge Plan:  (TBD) Barriers to Discharge: Continued Medical Work up  Expected Discharge Plan and Services     Post Acute Care Choice:  (TBD) Living arrangements for the past 2 months: Single Family Home                                       Social Determinants of Health (SDOH) Interventions SDOH Screenings   Food Insecurity: No Food Insecurity (08/10/2023)  Recent Concern: Food Insecurity - High Risk (07/17/2023)   Received from [facility]  Housing: Low Risk  (08/10/2023)  Recent Concern: Housing - High Risk (07/22/2023)   Received from [facility]  Transportation Needs: Unmet Transportation Needs (08/10/2023)  Utilities: Not At Risk (08/10/2023)  Recent Concern: Utilities - Medium Risk (07/17/2023)   Received from [facility]  Financial Resource Strain: Patient Declined (01/03/2023)   Received from [facility]  Social Connections: Unknown (08/10/2023)  Tobacco Use: High Risk (08/10/2023)    Readmission Risk Interventions     No data to display

## 2023-08-17 NOTE — Assessment & Plan Note (Signed)
 Although the patient continues to complain of this discomfort, abdominal exam does not reflect this. US of the RUQ of the abdomen is negative for cholecystitis or cholelithiasis.

## 2023-08-17 NOTE — Plan of Care (Signed)

## 2023-08-17 NOTE — Progress Notes (Signed)
 PT Cancellation Note  Patient Details Name: Allison Hill MRN: [id number] DOB: 07-Oct-1970   Cancelled Treatment:    Reason Eval/Treat Not Completed: Patient declined, no reason specified Patient asleep on arrival but awakens to name called. Patient declining OOB mobility due to "I need a nap". Provided education and encouragement but patient continued to decline. PT will re-attempt at later date/time.   [name], PT, DPT Physical Therapist - [facility]  [facility]    [name] 08/17/2023, 2:39 PM

## 2023-08-17 NOTE — Progress Notes (Signed)
 Progress Note   Patient: Allison Hill KVQ:[id number] DOB: [birth year] DOA: 08/09/2023     0 DOS: the patient was seen and examined on 08/17/2023   Brief hospital course: Shirleyann Montero is a 53 y.o. female with medical history significant of ESRD on HD MWF, refractory HTN, chronic HFpEF, chronic iron deficiency anemia admitted to hospitalist service for evaluation of chest pain, hypertensive urgency.  Patient's chest pain atypical, troponins negative.  Blood pressure improved with home regimen.  Patient lives with her daughter who does not want to take her home given high risk for falls, not safe for her HD transportation.  HD outpatient facility arranged.  TOC working on placement  On 08/13/2023/08/14/2023 patient is complaining of severe pain in her right ear that is not controlled by tylenol. The pain has not improved with IV Unasyn. CT head demonstrated mastoiditis and otitis media with swelling of the external canal. The patient was given IV Fentanyl for pain and antibiotics were expanded to include cipro PO. She will undergo dialysis on Saturday as part of her usual schedule.  Plan is for the patient to go to rehab following discharge. TOC has been consulted and are seeking insurance approval.  Assessment and Plan: Otitis media not resolved, right Severe. Cipro added for broader coverage as the patient has not improved with unasyn alone. Fentanyl for pain control. Blood cultures x 2 drawn. Cultures of the yellow fluid coming out of external canal is cultured. It has grown strep pneumoniae species that appears to be pan sensitive. Will discontinue Unasyn.   IV Fentanyl has been discontinued. She is receiving oral oxycodone for pain control.  Hypertensive urgency The patient's blood pressure is improved with amlodipine, losartan, imdur, terazosyn, and hydralazine. She is advised to be compliant with her antihypertensives upon discharge. However, her blood pressures have been elevated today  prior to dialysis. Better on 08/17/2023.  ESRD on dialysis (HCC) Pt went to dialysis on 08/15/2023 with removal of 1.8 L UF. She was also given 4000 U of Retacrit IV. She tolerated the procedure well.  Chest pain Resolved. Due to elevated pressures/musculoskeletal issues.   Right upper quadrant abdominal pain Although the patient continues to complain of this discomfort, abdominal exam does not reflect this. US of the RUQ of the abdomen is negative for cholecystitis or cholelithiasis.      Subjective: The patient is resting in bed. She is not complaining of ear pain.  Physical Exam: Vitals:   08/17/23 0329 08/17/23 0814 08/17/23 1153 08/17/23 1517  BP: (!) 146/96 (!) 151/88 117/77 117/79  Pulse: 85 90 87 (!) 106  Resp: 19 18 16 18   Temp: 97.6 F (36.4 C) 98.6 F (37 C) 98.4 F (36.9 C) 98.2 F (36.8 C)  TempSrc: Oral  Oral Oral  SpO2: 100% 95% 98% 98%  Weight:      Height:       Exam:  Constitutional:  The patient is awake, alert, and oriented x 3. No acute distress. Respiratory:  No increased work of breathing. No wheezes, rales, or rhonchi No tactile fremitus Cardiovascular:  Regular rate and rhythm No murmurs, ectopy, or gallups. No lateral PMI. No thrills. Abdomen:  Abdomen is soft, non-tender, non-distended No hernias, masses, or organomegaly Normoactive bowel sounds.  Musculoskeletal:  No cyanosis, clubbing, or edema Skin:  No rashes, lesions, ulcers palpation of skin: no induration or nodules Neurologic:  CN 2-12 intact Sensation all 4 extremities intact Psychiatric:  Mental status Mood, affect appropriate Orientation to person, place, time  judgment and insight appear intact  Data Reviewed:  CBC CT head  Family Communication: None available.  Disposition: Status is: Observation The patient remains OBS appropriate and will d/c before 2 midnights.  Planned Discharge Destination: Home    Time spent: 34 minutes  Author: Dayanira Giovannetti,  DO 08/17/2023 5:19 PM  For on call review [email or web address].

## 2023-08-18 DIAGNOSIS — I160 Hypertensive urgency: Secondary | ICD-10-CM | POA: Diagnosis not present

## 2023-08-18 DIAGNOSIS — R1011 Right upper quadrant pain: Secondary | ICD-10-CM

## 2023-08-18 DIAGNOSIS — N186 End stage renal disease: Secondary | ICD-10-CM | POA: Diagnosis not present

## 2023-08-18 DIAGNOSIS — H6691 Otitis media, unspecified, right ear: Secondary | ICD-10-CM | POA: Diagnosis not present

## 2023-08-18 DIAGNOSIS — I132 Hypertensive heart and chronic kidney disease with heart failure and with stage 5 chronic kidney disease, or end stage renal disease: Secondary | ICD-10-CM | POA: Diagnosis not present

## 2023-08-18 LAB — BASIC METABOLIC PANEL
Anion gap: 16 — ABNORMAL HIGH (ref 5–15)
BUN: 128 mg/dL — ABNORMAL HIGH (ref 6–20)
CO2: 19 mmol/L — ABNORMAL LOW (ref 22–32)
Calcium: 7 mg/dL — ABNORMAL LOW (ref 8.9–10.3)
Chloride: 99 mmol/L (ref 98–111)
Creatinine, Ser: 8.52 mg/dL — ABNORMAL HIGH (ref 0.44–1.00)
GFR, Estimated: 5 mL/min — ABNORMAL LOW (ref >60)
Glucose, Bld: 115 mg/dL — ABNORMAL HIGH (ref 70–99)
Potassium: 5.7 mmol/L — ABNORMAL HIGH (ref 3.5–5.1)
Sodium: 134 mmol/L — ABNORMAL LOW (ref 135–145)

## 2023-08-18 LAB — CBC
HCT: 23.8 % — ABNORMAL LOW (ref 36.0–46.0)
Hemoglobin: 7.9 g/dL — ABNORMAL LOW (ref 12.0–15.0)
MCH: 27.4 pg (ref 26.0–34.0)
MCHC: 33.2 g/dL (ref 30.0–36.0)
MCV: 82.6 fL (ref 80.0–100.0)
Platelets: 235 10*3/uL (ref 150–400)
RBC: 2.88 MIL/uL — ABNORMAL LOW (ref 3.87–5.11)
RDW: 17.2 % — ABNORMAL HIGH (ref 11.5–15.5)
WBC: 8.2 10*3/uL (ref 4.0–10.5)
nRBC: 0 % (ref 0.0–0.2)

## 2023-08-18 MED ORDER — EPOETIN ALFA-EPBX 4000 UNIT/ML IJ SOLN
INTRAMUSCULAR | Status: AC
  Filled 2023-08-18: qty 1

## 2023-08-18 NOTE — Plan of Care (Signed)

## 2023-08-18 NOTE — Progress Notes (Signed)
 PT Cancellation Note  Patient Details Name: Allison Hill MRN: [id number] DOB: August 08, 1970   Cancelled Treatment:    Reason Eval/Treat Not Completed: Patient at procedure or test/unavailable Off unit at HD. PT will re-attempt at later date/time.   [name], PT, DPT Physical Therapist - [place]  [facility]    Karis Rilling A Koi Zangara 08/18/2023, 8:12 AM

## 2023-08-18 NOTE — Progress Notes (Signed)
 [facility]  ROUNDING NOTE   Subjective:   Patient seen and evaluated during dialysis   HEMODIALYSIS FLOWSHEET:  Blood Flow Rate (mL/min): 350 mL/min Arterial Pressure (mmHg): -147.27 mmHg Venous Pressure (mmHg): 140.6 mmHg TMP (mmHg): 19.59 mmHg Ultrafiltration Rate (mL/min): 1065 mL/min Dialysate Flow Rate (mL/min): 300 ml/min Dialysis Fluid Bolus: Normal Saline  No complaints to offer.   Objective:  Vital signs in last 24 hours:  Temp:  [97.7 F (36.5 C)-98.4 F (36.9 C)] 97.7 F (36.5 C) (03/18 0819) Pulse Rate:  [79-106] 91 (03/18 1130) Resp:  [11-22] 16 (03/18 1130) BP: (117-169)/(71-112) 159/102 (03/18 1130) SpO2:  [95 %-100 %] 99 % (03/18 1130) Weight:  [68.4 kg] 68.4 kg (03/18 0819)  Weight change:  Filed Weights   08/15/23 0751 08/15/23 1152 08/18/23 0819  Weight: 66.1 kg 63.8 kg 68.4 kg    Intake/Output: I/O last 3 completed shifts: In: 820 [P.O.:720; Other:100] Out: 2 [Urine:2]   Intake/Output this shift:  No intake/output data recorded.  Physical Exam: General: NAD  Head: Normocephalic, atraumatic. Moist oral mucosal membranes  Eyes: Anicteric  Lungs:  Clear to auscultation  Heart: Regular rate and rhythm  Abdomen:  Soft, nontender  Extremities: No peripheral edema.  Neurologic: Nonfocal, moving all four extremities  Skin: No lesions  Access: Rt Chest Permcath    Basic Metabolic Panel: Recent Labs  Lab 08/12/23 0506 08/13/23 0502 08/15/23 0330 08/16/23 0311 08/18/23 0357  NA 138 134* 134* 135 134*  K 3.9 4.0 4.5 4.8 5.7*  CL 101 98 99 99 99  CO2 23 26 22 22  19*  GLUCOSE 87 86 104* 122* 115*  BUN 82* 58* 104* 80* 128*  CREATININE 6.91* 5.08* 8.10* 5.59* 8.52*  CALCIUM 9.2 9.3 7.4* 7.9* 7.0*    Liver Function Tests: No results for input(s): "AST", "ALT", "ALKPHOS", "BILITOT", "PROT", "ALBUMIN" in the last 168 hours.  No results for input(s): "LIPASE", "AMYLASE" in the last 168 hours. No results for input(s):  "AMMONIA" in the last 168 hours.  CBC: Recent Labs  Lab 08/13/23 0502 08/14/23 0531 08/15/23 0330 08/16/23 0311 08/18/23 0357  WBC  --  9.4 8.8 8.1 8.2  NEUTROABS  --  7.5  --   --   --   HGB 8.8* 9.8* 8.2* 8.4* 7.9*  HCT 26.7* 29.4* 24.3* 24.9* 23.8*  MCV  --  81.4 82.7 82.2 82.6  PLT  --  192 194 200 235    Cardiac Enzymes: No results for input(s): "CKTOTAL", "CKMB", "CKMBINDEX", "TROPONINI" in the last 168 hours.  BNP: Invalid input(s): "POCBNP"  CBG: No results for input(s): "GLUCAP" in the last 168 hours.  Microbiology: Results for orders placed or performed during the hospital encounter of 08/09/23  Resp panel by RT-PCR (RSV, Flu A&B, Covid) Anterior Nasal Swab     Status: None   Collection Time: 08/14/23  3:44 AM   Specimen: Anterior Nasal Swab  Result Value Ref Range Status   SARS Coronavirus 2 by RT PCR NEGATIVE NEGATIVE Final    Comment: (NOTE) SARS-CoV-2 target nucleic acids are NOT DETECTED.  The SARS-CoV-2 RNA is generally detectable in upper respiratory specimens during the acute phase of infection. The lowest concentration of SARS-CoV-2 viral copies this assay can detect is 138 copies/mL. A negative result does not preclude SARS-Cov-2 infection and should not be used as the sole basis for treatment or other patient management decisions. A negative result may occur with  improper specimen collection/handling, submission of specimen other than nasopharyngeal swab,  presence of viral mutation(s) within the areas targeted by this assay, and inadequate number of viral copies(<138 copies/mL). A negative result must be combined with clinical observations, patient history, and epidemiological information. The expected result is Negative.  Fact Sheet for Patients:  BloggerCourse.com  Fact Sheet for Healthcare Providers:  SeriousBroker.it  This test is no t yet approved or cleared by the [place] FDA and   has been authorized for detection and/or diagnosis of SARS-CoV-2 by FDA under an Emergency Use Authorization (EUA). This EUA will remain  in effect (meaning this test can be used) for the duration of the COVID-19 declaration under Section 564(b)(1) of the Act, 21 U.S.C.section 360bbb-3(b)(1), unless the authorization is terminated  or revoked sooner.       Influenza A by PCR NEGATIVE NEGATIVE Final   Influenza B by PCR NEGATIVE NEGATIVE Final    Comment: (NOTE) The Xpert Xpress SARS-CoV-2/FLU/RSV plus assay is intended as an aid in the diagnosis of influenza from Nasopharyngeal swab specimens and should not be used as a sole basis for treatment. Nasal washings and aspirates are unacceptable for Xpert Xpress SARS-CoV-2/FLU/RSV testing.  Fact Sheet for Patients: BloggerCourse.com  Fact Sheet for Healthcare Providers: SeriousBroker.it  This test is not yet approved or cleared by the [place] FDA and has been authorized for detection and/or diagnosis of SARS-CoV-2 by FDA under an Emergency Use Authorization (EUA). This EUA will remain in effect (meaning this test can be used) for the duration of the COVID-19 declaration under Section 564(b)(1) of the Act, 21 U.S.C. section 360bbb-3(b)(1), unless the authorization is terminated or revoked.     Resp Syncytial Virus by PCR NEGATIVE NEGATIVE Final    Comment: (NOTE) Fact Sheet for Patients: BloggerCourse.com  Fact Sheet for Healthcare Providers: SeriousBroker.it  This test is not yet approved or cleared by the [place] FDA and has been authorized for detection and/or diagnosis of SARS-CoV-2 by FDA under an Emergency Use Authorization (EUA). This EUA will remain in effect (meaning this test can be used) for the duration of the COVID-19 declaration under Section 564(b)(1) of the Act, 21 U.S.C. section 360bbb-3(b)(1),  unless the authorization is terminated or revoked.  Performed at [facility], [address] [place], [place] [address]   Respiratory (~20 pathogens) panel by PCR     Status: None   Collection Time: 08/14/23  3:44 AM   Specimen: Nasopharyngeal Swab; Respiratory  Result Value Ref Range Status   Adenovirus NOT DETECTED NOT DETECTED Final   Coronavirus 229E NOT DETECTED NOT DETECTED Final    Comment: (NOTE) The Coronavirus on the Respiratory Panel, DOES NOT test for the novel  Coronavirus (2019 nCoV)    Coronavirus HKU1 NOT DETECTED NOT DETECTED Final   Coronavirus NL63 NOT DETECTED NOT DETECTED Final   Coronavirus OC43 NOT DETECTED NOT DETECTED Final   Metapneumovirus NOT DETECTED NOT DETECTED Final   Rhinovirus / Enterovirus NOT DETECTED NOT DETECTED Final   Influenza A NOT DETECTED NOT DETECTED Final   Influenza B NOT DETECTED NOT DETECTED Final   Parainfluenza Virus 1 NOT DETECTED NOT DETECTED Final   Parainfluenza Virus 2 NOT DETECTED NOT DETECTED Final   Parainfluenza Virus 3 NOT DETECTED NOT DETECTED Final   Parainfluenza Virus 4 NOT DETECTED NOT DETECTED Final   Respiratory Syncytial Virus NOT DETECTED NOT DETECTED Final   Bordetella pertussis NOT DETECTED NOT DETECTED Final   Bordetella Parapertussis NOT DETECTED NOT DETECTED Final   Chlamydophila pneumoniae NOT DETECTED NOT DETECTED Final  Mycoplasma pneumoniae NOT DETECTED NOT DETECTED Final    Comment: Performed at [facility], [address] [place], [place] [address]  Culture, blood (Routine X 2) w Reflex to ID Panel     Status: None (Preliminary result)   Collection Time: 08/14/23  9:19 AM   Specimen: BLOOD  Result Value Ref Range Status   Specimen Description BLOOD BLOOD RIGHT HAND  Final   Special Requests   Final    BOTTLES DRAWN AEROBIC AND ANAEROBIC Blood Culture adequate volume   Culture   Final    NO GROWTH 4 DAYS Performed at [facility], [address], [place],  [place] [address]    Report Status PENDING  Incomplete  Culture, blood (Routine X 2) w Reflex to ID Panel     Status: None (Preliminary result)   Collection Time: 08/14/23  9:19 AM   Specimen: BLOOD  Result Value Ref Range Status   Specimen Description BLOOD BLOOD LEFT HAND  Final   Special Requests   Final    BOTTLES DRAWN AEROBIC AND ANAEROBIC Blood Culture adequate volume   Culture   Final    NO GROWTH 4 DAYS Performed at [facility], [address], [place], [place] [address]    Report Status PENDING  Incomplete  Ear culture     Status: None   Collection Time: 08/14/23 10:11 AM   Specimen: Ear; Other  Result Value Ref Range Status   Specimen Description   Final    EAR Performed at [facility], [address] [place], [place] [address]    Special Requests   Final    RIGHT EAR Performed at [facility], [address] [place], [place] [address]    Culture RARE STREPTOCOCCUS PNEUMONIAE  Final   Report Status 08/16/2023 FINAL  Final   Organism ID, Bacteria STREPTOCOCCUS PNEUMONIAE  Final      Susceptibility   Streptococcus pneumoniae - MIC*    ERYTHROMYCIN <=0.12 SENSITIVE Sensitive     LEVOFLOXACIN 1 SENSITIVE Sensitive     VANCOMYCIN 0.5 SENSITIVE Sensitive     PENICILLIN (meningitis) <=0.06 SENSITIVE Sensitive     PENO - penicillin <=0.06      PENICILLIN (non-meningitis) <=0.06 SENSITIVE Sensitive     PENICILLIN (oral) <=0.06 SENSITIVE Sensitive     CEFTRIAXONE (non-meningitis) <=0.12 SENSITIVE Sensitive     CEFTRIAXONE (meningitis) <=0.12 SENSITIVE Sensitive     * RARE STREPTOCOCCUS PNEUMONIAE    Coagulation Studies: No results for input(s): "LABPROT", "INR" in the last 72 hours.  Urinalysis: No results for input(s): "COLORURINE", "LABSPEC", "PHURINE", "GLUCOSEU", "HGBUR", "BILIRUBINUR", "KETONESUR", "PROTEINUR", "UROBILINOGEN", "NITRITE", "LEUKOCYTESUR" in the last 72 hours.  Invalid input(s): "APPERANCEUR"    Imaging: US Abdomen  Limited RUQ (LIVER/GB) Result Date: 08/17/2023 CLINICAL DATA:  Abdominal pain. EXAM: ULTRASOUND ABDOMEN LIMITED RIGHT UPPER QUADRANT COMPARISON:  Abdominal CT 02/21/2023 FINDINGS: Gallbladder: The gallbladder is partially contracted. No gallbladder wall thickening, gallstones or sonographic Murphy sign. Common bile duct: Diameter: 5 mm.  No intrahepatic biliary dilatation. Liver: No focal lesion identified. Within normal limits in parenchymal echogenicity. Portal vein is patent on color Doppler imaging with normal direction of blood flow towards the liver. Other: Echogenic right kidney noted in this patient reported to be on hemodialysis. IMPRESSION: 1. No acute findings or explanation for the patient's symptoms. 2. Incomplete gallbladder distension without focal abnormality. 3. Echogenic right kidney in this patient reported to be on hemodialysis. Electronically Signed   By: Hilarie Fredrickson.D.  On: 08/17/2023 11:41     Medications:     acetaminophen  1,000 mg Oral Q6H   amLODipine  10 mg Oral Daily   aspirin EC  81 mg Oral Daily   atorvastatin  40 mg Oral Daily   Chlorhexidine Gluconate Cloth  6 each Topical Q0600   cinacalcet  30 mg Oral Q supper   ciprofloxacin  500 mg Oral QPM   epoetin alfa-epbx (RETACRIT) injection  4,000 Units Intravenous Q T,Th,Sat-1800   fluticasone  1 spray Each Nare Daily   gabapentin  200 mg Oral BID   heparin  5,000 Units Subcutaneous Q12H   hydrALAZINE  100 mg Oral TID   hydrOXYzine  25 mg Oral TID   isosorbide mononitrate  120 mg Oral Daily   labetalol  600 mg Oral BID   losartan  100 mg Oral Daily   predniSONE  10 mg Oral 4X daily taper   sevelamer carbonate  1,600 mg Oral TID with meals   terazosin  2 mg Oral BID   ondansetron (ZOFRAN) IV, oxyCODONE, senna  Assessment/ Plan:  [name] is a 53 y.o.  female  with past medical conditions including hypertension, CHF, anemia, and end-stage renal disease on hemodialysis, who was admitted to  [facility] on 08/09/2023 for Hypertensive urgency [I16.0] Chest pain [R07.9] Nonspecific chest pain [R07.9] Anemia due to chronic kidney disease, on chronic dialysis (HCC) [N18.6, D63.1, Z99.2]    1.  End stage renal disease on hemodialysis.  Receiving dialysis today, UF 2L as tolerated. Next treatment scheduled for Thursday.  Outpatient dialysis clinic has been arranged at [facility].    2. Anemia of chronic kidney disease Hemoglobin & Hematocrit     Component Value Date/Time   HGB 7.9 (L) 08/18/2023 0357   HCT 23.8 (L) 08/18/2023 0357    Hemoglobin continues to decrease.  Will increase to EPO 10000 units with next dialysis treatment.   3. Hypertensive urgency, on admission.  Patient with reasonable blood pressure control now.  Continue amlodipine, hydralazine, labetalol, losartan, and terazosin  Blood pressure elevated during dialysis.    4. Secondary Hyperparathyroidism: with outpatient labs: None available Will continue to monitor bone minerals during this admission. Continue cinacalcet and sevelamer with meals    LOS: 0 Allison Hill 3/18/202511:35 AM

## 2023-08-18 NOTE — Progress Notes (Signed)
 Physical Therapy Treatment Patient Details Name: Allison Hill MRN: [id number] DOB: 09-May-1971 Today's Date: 08/18/2023   History of Present Illness Pt is a 53 y.o. female with medical history significant of ESRD on HD MWF, refractory HTN, chronic HFpEF, chronic iron deficiency anemia presented with new onset of chest pain, workup for hypertensive emergency.    PT Comments  Patient sitting on EOB on arrival and agreeable to PT treatment session. Ambulated 120' with CGA and rollator. Hitting her L foot on rollator wheel x 3 during ambulation due to wide BOS and externally rotated feet. Easily fatigued following dialysis this date. Discharge plan remains appropriate.     If plan is discharge home, recommend the following: A little help with walking and/or transfers;A little help with bathing/dressing/bathroom;Assistance with cooking/housework;Assist for transportation;Help with stairs or ramp for entrance   Can travel by private vehicle     Yes  Equipment Recommendations  None recommended by PT    Recommendations for Other Services       Precautions / Restrictions Precautions Precautions: Fall Recall of Precautions/Restrictions: Impaired Restrictions Weight Bearing Restrictions Per Provider Order: No     Mobility  Bed Mobility               General bed mobility comments: seated EOB on arrival    Transfers Overall transfer level: Needs assistance Equipment used: Rollator (4 wheels) Transfers: Sit to/from Stand Sit to Stand: Supervision                Ambulation/Gait Ambulation/Gait assistance: Contact guard assist Gait Distance (Feet): 120 Feet Assistive device: Rollator (4 wheels) Gait Pattern/deviations: Step-through pattern, Decreased step length - right, Decreased step length - left Gait velocity: decreased     General Gait Details: hitting L foot on wheel of rollator x 3 during session due to wide BOS and external rotation of feet   Stairs              Wheelchair Mobility     Tilt Bed    Modified Rankin (Stroke Patients Only)       Balance Overall balance assessment: Needs assistance Sitting-balance support: Feet supported Sitting balance-Leahy Scale: Good     Standing balance support: Bilateral upper extremity supported, Reliant on assistive device for balance, During functional activity Standing balance-Leahy Scale: Fair                              Hotel manager: Impaired Factors Affecting Communication: Difficulty expressing self  Cognition Arousal: Alert Behavior During Therapy: WFL for tasks assessed/performed   PT - Cognitive impairments: No family/caregiver present to determine baseline                         Following commands: Intact Following commands impaired: Only follows one step commands consistently    Cueing Cueing Techniques: Verbal cues  Exercises      General Comments        Pertinent Vitals/Pain Pain Assessment Pain Assessment: No/denies pain    Home Living                          Prior Function            PT Goals (current goals can now be found in the care plan section) Acute Rehab PT Goals Patient Stated Goal: to feel better PT Goal Formulation: With patient Time For Goal Achievement:  08/25/23 Potential to Achieve Goals: Good Progress towards PT goals: Progressing toward goals    Frequency    Min 1X/week      PT Plan      Co-evaluation              AM-PAC PT "6 Clicks" Mobility   Outcome Measure  Help needed turning from your back to your side while in a flat bed without using bedrails?: None Help needed moving from lying on your back to sitting on the side of a flat bed without using bedrails?: A Little Help needed moving to and from a bed to a chair (including a wheelchair)?: A Little Help needed standing up from a chair using your arms (e.g., wheelchair or bedside chair)?: A  Little Help needed to walk in hospital room?: A Little Help needed climbing 3-5 steps with a railing? : A Little 6 Click Score: 19    End of Session   Activity Tolerance: Patient tolerated treatment well Patient left: in bed;with call bell/phone within reach Nurse Communication: Mobility status PT Visit Diagnosis: Other abnormalities of gait and mobility (R26.89);Difficulty in walking, not elsewhere classified (R26.2);Muscle weakness (generalized) (M62.81)     Time: 1410-1420 PT Time Calculation (min) (ACUTE ONLY): 10 min  Charges:    $Therapeutic Activity: 8-22 mins PT General Charges $$ ACUTE PT VISIT: 1 Visit                     [name], PT, DPT Physical Therapist - [facility]  [facility]    Aspyn Warnke A Veeda Virgo 08/18/2023, 3:26 PM

## 2023-08-18 NOTE — Progress Notes (Signed)
 Progress Note   Patient: Allison Hill JYN:[id number] DOB: [birth year] DOA: 08/09/2023     0 DOS: the patient was seen and examined on 08/18/2023   Brief hospital course: Allison Hill is a 53 y.o. female with medical history significant of ESRD on HD MWF, refractory HTN, chronic HFpEF, chronic iron deficiency anemia admitted to hospitalist service for evaluation of chest pain, hypertensive urgency.  Patient's chest pain atypical, troponins negative.  Blood pressure improved with home regimen.  Patient lives with her daughter who does not want to take her home given high risk for falls, not safe for her HD transportation.  HD outpatient facility arranged.  TOC working on placement  On 08/13/2023/08/14/2023 patient is complaining of severe pain in her right ear that is not controlled by tylenol. The pain has not improved with IV Unasyn. CT head demonstrated mastoiditis and otitis media with swelling of the external canal. The patient was given IV Fentanyl for pain and antibiotics were expanded to include cipro PO. She will undergo dialysis on Saturday as part of her usual schedule.  Plan is for the patient to go to rehab at discharge. TOC has been consulted and are seeking insurance approval.  Assessment and Plan: Otitis media not resolved, right Severe. Cipro added for broader coverage as the patient has not improved with unasyn alone. Fentanyl for pain control. Blood cultures x 2 drawn. Cultures of the yellow fluid coming out of external canal is cultured. It has grown strep pneumoniae species that appears to be pan sensitive. Will discontinue Unasyn.   IV Fentanyl has been discontinued. She is receiving oral oxycodone for pain control.  Hypertensive urgency The patient's blood pressure is improved with amlodipine, losartan, imdur, terazosyn, and hydralazine. She is advised to be compliant with her antihypertensives upon discharge. However, her blood pressures have been elevated today prior to  dialysis. Better on 08/17/2023.  ESRD on dialysis [facility]) Nephrology following, continues dialysis per their recommendations, and HD today on the 18th, tolerating well.  Chest pain Resolved. Due to elevated pressures/musculoskeletal issues.   Right upper quadrant abdominal pain Although the patient continues to complain of this discomfort, abdominal exam does not reflect this. US of the RUQ of the abdomen is negative for cholecystitis or cholelithiasis.    Subjective: No acute issues or events overnight, denies nausea vomiting diarrhea constipation headache fevers chills or chest pain  Physical Exam: Vitals:   08/17/23 1517 08/17/23 1744 08/17/23 2027 08/18/23 0324  BP: 117/79 (!) 130/94 118/77 120/71  Pulse: (!) 106  88 84  Resp: 18  18 18   Temp: 98.2 F (36.8 C)  98.4 F (36.9 C) 98.3 F (36.8 C)  TempSrc: Oral  Oral   SpO2: 98%  95% 96%  Weight:      Height:       Exam:  General:  Pleasantly resting in bed, No acute distress. HEENT:  Normocephalic atraumatic.  Sclerae nonicteric, noninjected.  Extraocular movements intact bilaterally. Neck:  Without mass or deformity.  Trachea is midline. Lungs:  Clear to auscultate bilaterally without rhonchi, wheeze, or rales. Heart:  Regular rate and rhythm.  Without murmurs, rubs, or gallops. Abdomen:  Soft, nontender, nondistended.  Without guarding or rebound. Extremities: Without cyanosis, clubbing, edema, or obvious deformity. Skin:  Warm and dry, no erythema.  Data Reviewed:   Family Communication: None available.  Disposition: Status is: Observation The patient remains OBS appropriate and will d/c before 2 midnights.  Planned Discharge Destination: Home    Time spent: 34 minutes  Author: Carma Leaven DO 08/18/2023 7:42 AM  For on call review [email or web address].

## 2023-08-18 NOTE — Progress Notes (Signed)
 Hemodialysis Note:  Received patient in bed to unit. Alert and oriented. Informed consent singed and in chart.  Treatment initiated: 0829 Treatment completed: 1215  Access used: Right Subclavian catheter Access issues: None  Patient tolerated well. Transported back to room, alert without acute distress. Report given to patient's RN.  Total UF removed: 1.9 Liters Medications given: Retacrit 4000 units IV  Post HD weight: 66.5 Kg  [facility]

## 2023-08-18 NOTE — Progress Notes (Signed)
 OT Cancellation Note  Patient Details Name: Etty Isaac MRN: [id number] DOB: 1970/06/15   Cancelled Treatment:    Reason Eval/Treat Not Completed: Patient at procedure or test/ unavailable. Pt currently off unit for HD treatment. OT to re-attempt as pt is next available and as time allows.   Jackquline Denmark, MS, OTR/L , CBIS ascom [phone]  08/18/23, 9:53 AM

## 2023-08-19 DIAGNOSIS — I160 Hypertensive urgency: Secondary | ICD-10-CM | POA: Diagnosis not present

## 2023-08-19 DIAGNOSIS — H6691 Otitis media, unspecified, right ear: Secondary | ICD-10-CM | POA: Diagnosis not present

## 2023-08-19 DIAGNOSIS — I132 Hypertensive heart and chronic kidney disease with heart failure and with stage 5 chronic kidney disease, or end stage renal disease: Secondary | ICD-10-CM | POA: Diagnosis not present

## 2023-08-19 DIAGNOSIS — N186 End stage renal disease: Secondary | ICD-10-CM | POA: Diagnosis not present

## 2023-08-19 DIAGNOSIS — Z992 Dependence on renal dialysis: Secondary | ICD-10-CM | POA: Diagnosis not present

## 2023-08-19 LAB — CULTURE, BLOOD (ROUTINE X 2)
Culture: NO GROWTH
Culture: NO GROWTH
Special Requests: ADEQUATE
Special Requests: ADEQUATE

## 2023-08-19 LAB — RENAL FUNCTION PANEL
Albumin: 3.9 g/dL (ref 3.5–5.0)
Anion gap: 17 — ABNORMAL HIGH (ref 5–15)
BUN: 78 mg/dL — ABNORMAL HIGH (ref 6–20)
CO2: 23 mmol/L (ref 22–32)
Calcium: 7.6 mg/dL — ABNORMAL LOW (ref 8.9–10.3)
Chloride: 94 mmol/L — ABNORMAL LOW (ref 98–111)
Creatinine, Ser: 5.03 mg/dL — ABNORMAL HIGH (ref 0.44–1.00)
GFR, Estimated: 10 mL/min — ABNORMAL LOW (ref >60)
Glucose, Bld: 123 mg/dL — ABNORMAL HIGH (ref 70–99)
Phosphorus: 5.5 mg/dL — ABNORMAL HIGH (ref 2.5–4.6)
Potassium: 4.3 mmol/L (ref 3.5–5.1)
Sodium: 134 mmol/L — ABNORMAL LOW (ref 135–145)

## 2023-08-19 MED ORDER — EPOETIN ALFA-EPBX 10000 UNIT/ML IJ SOLN: 10000.0000 [IU] | INTRAMUSCULAR | Status: DC

## 2023-08-19 MED ORDER — LEVOFLOXACIN 500 MG PO TABS
250.0000 mg | ORAL_TABLET | Freq: Every evening | ORAL | Status: AC
  Administered 2023-08-19 – 2023-08-20 (×2): 250 mg via ORAL
  Filled 2023-08-19 (×2): qty 1

## 2023-08-19 NOTE — TOC Progression Note (Signed)
 Transition of Care [facility]) - Progression Note    Patient Details  Name: Allison Hill MRN: [id number] Date of Birth: Oct 19, 1970  [facility]) CM/SW Contact  Garret Reddish, RN Phone Number: 08/19/2023, 2:41 PM  Clinical Narrative:    Late entry for 08/18/23.  Informed by payor that patient has been denied for SNF.  Payor has offered Peer to Peer. I have provider the below information for Peer to Peer:  Dr. Natale Milch the insurance company has requested a Peer to Peer on [name]. I have enclosed information. Thank you.  Peer[employer]Peer requested please call [phone] opt 5. Deadline 3.18.25 @ 4 pm. Pt ID: [id number] Thank you.  TOC will continue to follow for discharge planning.     Expected Discharge Plan:  (TBD) Barriers to Discharge: Continued Medical Work up  Expected Discharge Plan and Services     Post Acute Care Choice:  (TBD) Living arrangements for the past 2 months: Single Family Home                                       Social Determinants of Health (SDOH) Interventions SDOH Screenings   Food Insecurity: No Food Insecurity (08/10/2023)  Recent Concern: Food Insecurity - High Risk (07/17/2023)   Received from [facility]  Housing: Low Risk  (08/10/2023)  Recent Concern: Housing - High Risk (07/22/2023)   Received from [facility]  Transportation Needs: Unmet Transportation Needs (08/10/2023)  Utilities: Not At Risk (08/10/2023)  Recent Concern: Utilities - Medium Risk (07/17/2023)   Received from [facility]  Financial Resource Strain: Patient Declined (01/03/2023)   Received from [facility]  Social Connections: Unknown (08/10/2023)  Tobacco Use: High Risk (08/10/2023)    Readmission Risk Interventions     No data to display

## 2023-08-19 NOTE — Progress Notes (Signed)
 Occupational Therapy Treatment Patient Details Name: Allison Hill MRN: [id number] DOB: 1970-09-13 Today's Date: 08/19/2023   History of present illness Pt is a 53 y.o. female with medical history significant of ESRD on HD MWF, refractory HTN, chronic HFpEF, chronic iron deficiency anemia presented with new onset of chest pain, workup for hypertensive emergency.   OT comments  Upon entering the room, pt seated on EOB and perseverating on discharge. Pt asking to be discharged to shelter and RN notified. Pt donned B socks and shoes and ambulated with rollator 150' with supervision. Pt picking up 2 items from floor with close supervision as well. Pt returning to room and wanting to pack up room items. RN notified. Call bell and all needed items within reach.       If plan is discharge home, recommend the following:  A little help with walking and/or transfers;A little help with bathing/dressing/bathroom;Assistance with cooking/housework;Assistance with feeding;Direct supervision/assist for medications management;Direct supervision/assist for financial management;Assist for transportation;Help with stairs or ramp for entrance;Supervision due to cognitive status   Equipment Recommendations  None recommended by OT       Precautions / Restrictions Precautions Precautions: Fall Recall of Precautions/Restrictions: Impaired       Mobility Bed Mobility Overal bed mobility: Modified Independent                  Transfers Overall transfer level: Needs assistance Equipment used: Rollator (4 wheels) Transfers: Sit to/from Stand Sit to Stand: Supervision                 Balance Overall balance assessment: Needs assistance Sitting-balance support: Feet supported Sitting balance-Leahy Scale: Good     Standing balance support: Bilateral upper extremity supported, Reliant on assistive device for balance, During functional activity Standing balance-Leahy Scale: Fair                              ADL either performed or assessed with clinical judgement   ADL Overall ADL's : Needs assistance/impaired                                       General ADL Comments: supervision overall to don B socks and shoes seated on EOB    Extremity/Trunk Assessment Upper Extremity Assessment Upper Extremity Assessment: Generalized weakness   Lower Extremity Assessment Lower Extremity Assessment: Generalized weakness        Vision Patient Visual Report: No change from baseline               Cognition Arousal: Alert Behavior During Therapy: WFL for tasks assessed/performed Cognition: Cognition impaired, No family/caregiver present to determine baseline           Executive functioning impairment (select all impairments): Reasoning, Problem solving                   Following commands: Intact Following commands impaired: Only follows one step commands consistently      Cueing   Cueing Techniques: Verbal cues             Pertinent Vitals/ Pain       Pain Assessment Pain Assessment: No/denies pain         Frequency  Min 2X/week        Progress Toward Goals  OT Goals(current goals can now be found in the care plan section)  Progress  towards OT goals: Progressing toward goals      AM-PAC OT "6 Clicks" Daily Activity     Outcome Measure   Help from another person eating meals?: None Help from another person taking care of personal grooming?: None Help from another person toileting, which includes using toliet, bedpan, or urinal?: A Little Help from another person bathing (including washing, rinsing, drying)?: A Little Help from another person to put on and taking off regular upper body clothing?: A Little Help from another person to put on and taking off regular lower body clothing?: A Little 6 Click Score: 20    End of Session Equipment Utilized During Treatment: Other (comment) (rollator)  OT Visit  Diagnosis: Unsteadiness on feet (R26.81);Other abnormalities of gait and mobility (R26.89);Muscle weakness (generalized) (M62.81)   Activity Tolerance Patient tolerated treatment well   Patient Left in chair;with call bell/phone within reach;with chair alarm set   Nurse Communication Mobility status        Time: 8469-6295 OT Time Calculation (min): 19 min  Charges: OT General Charges $OT Visit: 1 Visit OT Treatments $Therapeutic Activity: 8-22 mins  Jackquline Denmark, MS, OTR/L , CBIS ascom [phone]  08/19/23, 3:49 PM

## 2023-08-19 NOTE — TOC Progression Note (Addendum)
 Transition of Care [facility]) - Progression Note    Patient Details  Name: Allison Hill MRN: [id number] Date of Birth: 1971-02-19  [facility] CM/SW Contact  Truddie Hidden, RN Phone Number: 08/19/2023, 2:46 PM  Clinical Narrative:    Peer to peer was offered and completed yesterday by Dr. Natale Milch.  Patient was denied SNF due to meeting medical necessity. Spoke with patient's daughter, Allison Hill. She stated she works full time. Patient can not return to Adasia's home for fear of her being unsafe in the home. Allison Hill is requesting LTC. She was advised LTSS is needed for patient. Bed search extended for LTC. MD notified.   S/w Tammy from [facility] regarding LTC.   3:05pm Spoke with Kenney Houseman from [facility] regarding LTC. She will review referral again.      Expected Discharge Plan:  (TBD) Barriers to Discharge: Continued Medical Work up  Expected Discharge Plan and Services     Post Acute Care Choice:  (TBD) Living arrangements for the past 2 months: Single Family Home                                       Social Determinants of Health (SDOH) Interventions SDOH Screenings   Food Insecurity: No Food Insecurity (08/10/2023)  Recent Concern: Food Insecurity - High Risk (07/17/2023)   Received from [facility]  Housing: Low Risk  (08/10/2023)  Recent Concern: Housing - High Risk (07/22/2023)   Received from [facility]  Transportation Needs: Unmet Transportation Needs (08/10/2023)  Utilities: Not At Risk (08/10/2023)  Recent Concern: Utilities - Medium Risk (07/17/2023)   Received from [facility]  Financial Resource Strain: Patient Declined (01/03/2023)   Received from [facility]  Social Connections: Unknown (08/10/2023)  Tobacco Use: High Risk (08/10/2023)    Readmission Risk Interventions     No data to display
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
## 2023-08-19 NOTE — Plan of Care (Signed)
°  Problem: Education: Goal: Understanding of cardiac disease, CV risk reduction, and recovery process will improve Outcome: Progressing Goal: Individualized Educational Video(s) Outcome: Progressing   Problem: Activity: Goal: Ability to tolerate increased activity will improve Outcome: Progressing   Problem: Cardiac: Goal: Ability to achieve and maintain adequate cardiovascular perfusion will improve Outcome: Progressing   Problem: Health Behavior/Discharge Planning: Goal: Ability to safely manage health-related needs after discharge will improve Outcome: Progressing   Problem: Education: Goal: Knowledge of General Education information will improve Description: Including pain rating scale, medication(s)/side effects and non-pharmacologic comfort measures Outcome: Progressing

## 2023-08-19 NOTE — Progress Notes (Signed)
 [facility]  ROUNDING NOTE   Subjective:   Patient seen sitting up in bed Alert and oriented States she is ready for discharge.   Discharge planning underway   Objective:  Vital signs in last 24 hours:  Temp:  [98 F (36.7 C)-98.4 F (36.9 C)] 98.2 F (36.8 C) (03/19 1141) Pulse Rate:  [80-85] 80 (03/19 1141) Resp:  [18] 18 (03/19 0336) BP: (109-143)/(73-92) 109/73 (03/19 1141) SpO2:  [98 %-100 %] 100 % (03/19 1141)  Weight change:  Filed Weights   08/15/23 1152 08/18/23 0819 08/18/23 1215  Weight: 63.8 kg 68.4 kg 66.5 kg    Intake/Output: I/O last 3 completed shifts: In: 820 [P.O.:720; Other:100] Out: 1902 [Urine:2; Other:1900]   Intake/Output this shift:  No intake/output data recorded.  Physical Exam: General: NAD  Head: Normocephalic, atraumatic. Moist oral mucosal membranes  Eyes: Anicteric  Lungs:  Clear to auscultation  Heart: Regular rate and rhythm  Abdomen:  Soft, nontender  Extremities: No peripheral edema.  Neurologic: Alert, moving all four extremities  Skin: No lesions  Access: Rt Chest Permcath    Basic Metabolic Panel: Recent Labs  Lab 08/13/23 0502 08/15/23 0330 08/16/23 0311 08/18/23 0357 08/19/23 0334  NA 134* 134* 135 134* 134*  K 4.0 4.5 4.8 5.7* 4.3  CL 98 99 99 99 94*  CO2 26 22 22  19* 23  GLUCOSE 86 104* 122* 115* 123*  BUN 58* 104* 80* 128* 78*  CREATININE 5.08* 8.10* 5.59* 8.52* 5.03*  CALCIUM 9.3 7.4* 7.9* 7.0* 7.6*  PHOS  --   --   --   --  5.5*    Liver Function Tests: Recent Labs  Lab 08/19/23 0334  ALBUMIN 3.9    No results for input(s): "LIPASE", "AMYLASE" in the last 168 hours. No results for input(s): "AMMONIA" in the last 168 hours.  CBC: Recent Labs  Lab 08/13/23 0502 08/14/23 0531 08/15/23 0330 08/16/23 0311 08/18/23 0357  WBC  --  9.4 8.8 8.1 8.2  NEUTROABS  --  7.5  --   --   --   HGB 8.8* 9.8* 8.2* 8.4* 7.9*  HCT 26.7* 29.4* 24.3* 24.9* 23.8*  MCV  --  81.4 82.7 82.2 82.6   PLT  --  192 194 200 235    Cardiac Enzymes: No results for input(s): "CKTOTAL", "CKMB", "CKMBINDEX", "TROPONINI" in the last 168 hours.  BNP: Invalid input(s): "POCBNP"  CBG: No results for input(s): "GLUCAP" in the last 168 hours.  Microbiology: Results for orders placed or performed during the hospital encounter of 08/09/23  Resp panel by RT-PCR (RSV, Flu A&B, Covid) Anterior Nasal Swab     Status: None   Collection Time: 08/14/23  3:44 AM   Specimen: Anterior Nasal Swab  Result Value Ref Range Status   SARS Coronavirus 2 by RT PCR NEGATIVE NEGATIVE Final    Comment: (NOTE) SARS-CoV-2 target nucleic acids are NOT DETECTED.  The SARS-CoV-2 RNA is generally detectable in upper respiratory specimens during the acute phase of infection. The lowest concentration of SARS-CoV-2 viral copies this assay can detect is 138 copies/mL. A negative result does not preclude SARS-Cov-2 infection and should not be used as the sole basis for treatment or other patient management decisions. A negative result may occur with  improper specimen collection/handling, submission of specimen other than nasopharyngeal swab, presence of viral mutation(s) within the areas targeted by this assay, and inadequate number of viral copies(<138 copies/mL). A negative result must be combined with clinical observations, patient  history, and epidemiological information. The expected result is Negative.  Fact Sheet for Patients:  BloggerCourse.com  Fact Sheet for Healthcare Providers:  SeriousBroker.it  This test is no t yet approved or cleared by the [place] FDA and  has been authorized for detection and/or diagnosis of SARS-CoV-2 by FDA under an Emergency Use Authorization (EUA). This EUA will remain  in effect (meaning this test can be used) for the duration of the COVID-19 declaration under Section 564(b)(1) of the Act, 21 U.S.C.section  360bbb-3(b)(1), unless the authorization is terminated  or revoked sooner.       Influenza A by PCR NEGATIVE NEGATIVE Final   Influenza B by PCR NEGATIVE NEGATIVE Final    Comment: (NOTE) The Xpert Xpress SARS-CoV-2/FLU/RSV plus assay is intended as an aid in the diagnosis of influenza from Nasopharyngeal swab specimens and should not be used as a sole basis for treatment. Nasal washings and aspirates are unacceptable for Xpert Xpress SARS-CoV-2/FLU/RSV testing.  Fact Sheet for Patients: BloggerCourse.com  Fact Sheet for Healthcare Providers: SeriousBroker.it  This test is not yet approved or cleared by the [place] FDA and has been authorized for detection and/or diagnosis of SARS-CoV-2 by FDA under an Emergency Use Authorization (EUA). This EUA will remain in effect (meaning this test can be used) for the duration of the COVID-19 declaration under Section 564(b)(1) of the Act, 21 U.S.C. section 360bbb-3(b)(1), unless the authorization is terminated or revoked.     Resp Syncytial Virus by PCR NEGATIVE NEGATIVE Final    Comment: (NOTE) Fact Sheet for Patients: BloggerCourse.com  Fact Sheet for Healthcare Providers: SeriousBroker.it  This test is not yet approved or cleared by the [place] FDA and has been authorized for detection and/or diagnosis of SARS-CoV-2 by FDA under an Emergency Use Authorization (EUA). This EUA will remain in effect (meaning this test can be used) for the duration of the COVID-19 declaration under Section 564(b)(1) of the Act, 21 U.S.C. section 360bbb-3(b)(1), unless the authorization is terminated or revoked.  Performed at [facility], [address] [place], [place] [address]   Respiratory (~20 pathogens) panel by PCR     Status: None   Collection Time: 08/14/23  3:44 AM   Specimen: Nasopharyngeal Swab; Respiratory   Result Value Ref Range Status   Adenovirus NOT DETECTED NOT DETECTED Final   Coronavirus 229E NOT DETECTED NOT DETECTED Final    Comment: (NOTE) The Coronavirus on the Respiratory Panel, DOES NOT test for the novel  Coronavirus (2019 nCoV)    Coronavirus HKU1 NOT DETECTED NOT DETECTED Final   Coronavirus NL63 NOT DETECTED NOT DETECTED Final   Coronavirus OC43 NOT DETECTED NOT DETECTED Final   Metapneumovirus NOT DETECTED NOT DETECTED Final   Rhinovirus / Enterovirus NOT DETECTED NOT DETECTED Final   Influenza A NOT DETECTED NOT DETECTED Final   Influenza B NOT DETECTED NOT DETECTED Final   Parainfluenza Virus 1 NOT DETECTED NOT DETECTED Final   Parainfluenza Virus 2 NOT DETECTED NOT DETECTED Final   Parainfluenza Virus 3 NOT DETECTED NOT DETECTED Final   Parainfluenza Virus 4 NOT DETECTED NOT DETECTED Final   Respiratory Syncytial Virus NOT DETECTED NOT DETECTED Final   Bordetella pertussis NOT DETECTED NOT DETECTED Final   Bordetella Parapertussis NOT DETECTED NOT DETECTED Final   Chlamydophila pneumoniae NOT DETECTED NOT DETECTED Final   Mycoplasma pneumoniae NOT DETECTED NOT DETECTED Final    Comment: Performed at [facility], [address] [place], [place] [address]  Culture, blood (  Routine X 2) w Reflex to ID Panel     Status: None   Collection Time: 08/14/23  9:19 AM   Specimen: BLOOD  Result Value Ref Range Status   Specimen Description BLOOD BLOOD RIGHT HAND  Final   Special Requests   Final    BOTTLES DRAWN AEROBIC AND ANAEROBIC Blood Culture adequate volume   Culture   Final    NO GROWTH 5 DAYS Performed at [facility], [address] [place], [place] [address]    Report Status 08/19/2023 FINAL  Final  Culture, blood (Routine X 2) w Reflex to ID Panel     Status: None   Collection Time: 08/14/23  9:19 AM   Specimen: BLOOD  Result Value Ref Range Status   Specimen Description BLOOD BLOOD LEFT HAND  Final   Special Requests   Final    BOTTLES  DRAWN AEROBIC AND ANAEROBIC Blood Culture adequate volume   Culture   Final    NO GROWTH 5 DAYS Performed at [facility], [address] [place], [place] [address]    Report Status 08/19/2023 FINAL  Final  Ear culture     Status: None   Collection Time: 08/14/23 10:11 AM   Specimen: Ear; Other  Result Value Ref Range Status   Specimen Description   Final    EAR Performed at [facility], [address], [place], [place] [address]    Special Requests   Final    RIGHT EAR Performed at [facility], [address] [place], [place] [address]    Culture RARE STREPTOCOCCUS PNEUMONIAE  Final   Report Status 08/16/2023 FINAL  Final   Organism ID, Bacteria STREPTOCOCCUS PNEUMONIAE  Final      Susceptibility   Streptococcus pneumoniae - MIC*    ERYTHROMYCIN <=0.12 SENSITIVE Sensitive     LEVOFLOXACIN 1 SENSITIVE Sensitive     VANCOMYCIN 0.5 SENSITIVE Sensitive     PENICILLIN (meningitis) <=0.06 SENSITIVE Sensitive     PENO - penicillin <=0.06      PENICILLIN (non-meningitis) <=0.06 SENSITIVE Sensitive     PENICILLIN (oral) <=0.06 SENSITIVE Sensitive     CEFTRIAXONE (non-meningitis) <=0.12 SENSITIVE Sensitive     CEFTRIAXONE (meningitis) <=0.12 SENSITIVE Sensitive     * RARE STREPTOCOCCUS PNEUMONIAE    Coagulation Studies: No results for input(s): "LABPROT", "INR" in the last 72 hours.  Urinalysis: No results for input(s): "COLORURINE", "LABSPEC", "PHURINE", "GLUCOSEU", "HGBUR", "BILIRUBINUR", "KETONESUR", "PROTEINUR", "UROBILINOGEN", "NITRITE", "LEUKOCYTESUR" in the last 72 hours.  Invalid input(s): "APPERANCEUR"    Imaging: No results found.    Medications:     acetaminophen  1,000 mg Oral Q6H   amLODipine  10 mg Oral Daily   aspirin EC  81 mg Oral Daily   atorvastatin  40 mg Oral Daily   Chlorhexidine Gluconate Cloth  6 each Topical Q0600   cinacalcet  30 mg Oral Q supper   epoetin alfa-epbx (RETACRIT) injection  4,000 Units  Intravenous Q T,Th,Sat-1800   fluticasone  1 spray Each Nare Daily   gabapentin  200 mg Oral BID   heparin  5,000 Units Subcutaneous Q12H   hydrALAZINE  100 mg Oral TID   hydrOXYzine  25 mg Oral TID   isosorbide mononitrate  120 mg Oral Daily   labetalol  600 mg Oral BID   levofloxacin  250 mg Oral QPM   losartan  100 mg Oral Daily   predniSONE  10 mg Oral 4X daily taper   sevelamer carbonate  1,600 mg Oral TID with meals   terazosin  2 mg Oral BID   ondansetron (ZOFRAN) IV, oxyCODONE, senna  Assessment/ Plan:  Ms. Ellan Tess is a 53 y.o.  female  with past medical conditions including hypertension, CHF, anemia, and end-stage renal disease on hemodialysis, who was admitted to [facility] on 08/09/2023 for Hypertensive urgency [I16.0] Chest pain [R07.9] Nonspecific chest pain [R07.9] Anemia due to chronic kidney disease, on chronic dialysis (HCC) [N18.6, D63.1, Z99.2]    1.  End stage renal disease on hemodialysis.  Next treatment scheduled for Thursday.  Outpatient dialysis clinic has been arranged at [facility] on a TTS schedule, chair time at 530 am.    2. Anemia of chronic kidney disease Hemoglobin & Hematocrit     Component Value Date/Time   HGB 7.9 (L) 08/18/2023 0357   HCT 23.8 (L) 08/18/2023 0357    Hemoglobin 7.9.Will continue EPO 10000 units with next dialysis treatment.   3. Hypertensive urgency, on admission.  Patient with reasonable blood pressure control now.  Continue amlodipine, hydralazine, labetalol, losartan, and terazosin  Blood pressure stable   4. Secondary Hyperparathyroidism: with outpatient labs: None available Will continue to monitor bone minerals during this admission. Continue cinacalcet and sevelamer with meals    LOS: 0 Samay Delcarlo 3/19/20253:05 PM

## 2023-08-19 NOTE — Progress Notes (Signed)
 Progress Note   Patient: Allison Hill ZOX:[id number] DOB: 11-20-1970 DOA: 08/09/2023     0 DOS: the patient was seen and examined on 08/19/2023   Brief hospital course: Betha Shadix is a 53 y.o. female with medical history significant of ESRD on HD MWF, refractory HTN, chronic HFpEF, chronic iron deficiency anemia admitted to hospitalist service for evaluation of chest pain, hypertensive urgency.  Patient's chest pain atypical, troponins negative.  Blood pressure improved with home regimen.  Patient lives with her daughter who does not want to take her home given high risk for falls, not safe for her HD transportation.  HD outpatient facility arranged.  TOC working on placement   On 08/13/2023/08/14/2023 patient is complaining of severe pain in her right ear that is not controlled by tylenol. The pain has not improved with IV Unasyn. CT head demonstrated mastoiditis and otitis media with swelling of the external canal. The patient was given IV Fentanyl for pain and antibiotics were expanded to include cipro PO. She will undergo dialysis on Saturday as part of her usual schedule.  PT/OT recommended nursing home placement, however insurance company denied the application.   Principal Problem:   Chest pain Active Problems:   Otitis media not resolved, right   Hypertensive urgency   ESRD on dialysis (HCC)   Right upper quadrant abdominal pain   Assessment and Plan: Otitis media not resolved, right Severe. Cipro added for broader coverage as the patient has not improved with unasyn alone. Fentanyl for pain control. Blood cultures x 2 drawn. Cultures of the yellow fluid coming out of external canal is cultured. It has grown strep pneumoniae species that appears to be pan sensitive.  She was initially treated with Unasyn, then changed to Cipro 4 days ago.   Hypertensive urgency The patient's blood pressure is improved with amlodipine, losartan, imdur, terazosyn, and hydralazine.   ESRD on  dialysis [facility]) Nephrology following, continues dialysis per their recommendations, and HD today on the 18th, tolerating well.   Chest pain Resolved. Due to elevated pressures/musculoskeletal issues.    Right upper quadrant abdominal pain Although the patient continues to complain of this discomfort, abdominal exam does not reflect this. US of the RUQ of the abdomen is negative for cholecystitis or cholelithiasis.       Subjective:  Patient doing well today, ear pain has resolved.  Physical Exam: Vitals:   08/18/23 2319 08/19/23 0336 08/19/23 0735 08/19/23 1141  BP: 117/74 137/86 (!) 143/92 109/73  Pulse: 80 85 82 80  Resp: 18 18    Temp: 98.4 F (36.9 C) 98.3 F (36.8 C) 98.4 F (36.9 C) 98.2 F (36.8 C)  TempSrc:      SpO2: 100% 98% 100% 100%  Weight:      Height:       General exam: Appears calm and comfortable  Respiratory system: Clear to auscultation. Respiratory effort normal. Cardiovascular system: S1 & S2 heard, RRR. No JVD, murmurs, rubs, gallops or clicks. No pedal edema. Gastrointestinal system: Abdomen is nondistended, soft and nontender. No organomegaly or masses felt. Normal bowel sounds heard. Central nervous system: Alert and oriented. No focal neurological deficits. Extremities: Symmetric 5 x 5 power. Skin: No rashes, lesions or ulcers Psychiatry: Judgement and insight appear normal. Mood & affect appropriate.    Data Reviewed:  Imaging studies, lab results.  Family Communication: None  Disposition: Status is: Observation      Time spent: 35 minutes  Author: Marrion Coy, MD 08/19/2023 2:38 PM  For on  call review [email or web address].

## 2023-08-20 DIAGNOSIS — Z992 Dependence on renal dialysis: Secondary | ICD-10-CM | POA: Diagnosis not present

## 2023-08-20 DIAGNOSIS — H6691 Otitis media, unspecified, right ear: Secondary | ICD-10-CM | POA: Diagnosis not present

## 2023-08-20 DIAGNOSIS — N186 End stage renal disease: Secondary | ICD-10-CM | POA: Diagnosis not present

## 2023-08-20 DIAGNOSIS — I132 Hypertensive heart and chronic kidney disease with heart failure and with stage 5 chronic kidney disease, or end stage renal disease: Secondary | ICD-10-CM | POA: Diagnosis not present

## 2023-08-20 DIAGNOSIS — I160 Hypertensive urgency: Secondary | ICD-10-CM | POA: Diagnosis not present

## 2023-08-20 LAB — RENAL FUNCTION PANEL
Albumin: 3.3 g/dL — ABNORMAL LOW (ref 3.5–5.0)
Anion gap: 10 (ref 5–15)
BUN: 108 mg/dL — ABNORMAL HIGH (ref 6–20)
CO2: 26 mmol/L (ref 22–32)
Calcium: 6.4 mg/dL — CL (ref 8.9–10.3)
Chloride: 98 mmol/L (ref 98–111)
Creatinine, Ser: 7.19 mg/dL — ABNORMAL HIGH (ref 0.44–1.00)
GFR, Estimated: 6 mL/min — ABNORMAL LOW (ref >60)
Glucose, Bld: 69 mg/dL — ABNORMAL LOW (ref 70–99)
Phosphorus: 6.1 mg/dL — ABNORMAL HIGH (ref 2.5–4.6)
Potassium: 5.4 mmol/L — ABNORMAL HIGH (ref 3.5–5.1)
Sodium: 134 mmol/L — ABNORMAL LOW (ref 135–145)

## 2023-08-20 LAB — CBC
HCT: 25.4 % — ABNORMAL LOW (ref 36.0–46.0)
Hemoglobin: 8.4 g/dL — ABNORMAL LOW (ref 12.0–15.0)
MCH: 27.7 pg (ref 26.0–34.0)
MCHC: 33.1 g/dL (ref 30.0–36.0)
MCV: 83.8 fL (ref 80.0–100.0)
Platelets: 260 10*3/uL (ref 150–400)
RBC: 3.03 MIL/uL — ABNORMAL LOW (ref 3.87–5.11)
RDW: 17.2 % — ABNORMAL HIGH (ref 11.5–15.5)
WBC: 8.4 10*3/uL (ref 4.0–10.5)
nRBC: 0 % (ref 0.0–0.2)

## 2023-08-20 MED ORDER — EPOETIN ALFA 10000 UNIT/ML IJ SOLN
10000.0000 [IU] | INTRAMUSCULAR | Status: DC
  Administered 2023-08-20 – 2023-09-19 (×14): 10000 [IU] via INTRAVENOUS
  Filled 2023-08-20 (×29): qty 1

## 2023-08-20 MED ORDER — EPOETIN ALFA 10000 UNIT/ML IJ SOLN
INTRAMUSCULAR | Status: AC
  Filled 2023-08-20: qty 1

## 2023-08-20 MED ORDER — SENNOSIDES-DOCUSATE SODIUM 8.6-50 MG PO TABS
2.0000 | ORAL_TABLET | Freq: Two times a day (BID) | ORAL | Status: DC
  Administered 2023-08-20 – 2023-09-22 (×57): 2 via ORAL
  Filled 2023-08-20 (×65): qty 2

## 2023-08-20 MED ORDER — CALCIUM CARBONATE ANTACID 500 MG PO CHEW
400.0000 mg | CHEWABLE_TABLET | Freq: Two times a day (BID) | ORAL | Status: DC
  Administered 2023-08-21 – 2023-09-23 (×56): 400 mg via ORAL
  Filled 2023-08-20 (×64): qty 2

## 2023-08-20 MED ORDER — CALCIUM CARBONATE ANTACID 500 MG PO CHEW
400.0000 mg | CHEWABLE_TABLET | Freq: Two times a day (BID) | ORAL | Status: DC
  Administered 2023-08-20: 400 mg via ORAL
  Filled 2023-08-20 (×2): qty 2

## 2023-08-20 MED ORDER — LACTULOSE 10 GM/15ML PO SOLN
20.0000 g | Freq: Once | ORAL | Status: AC
  Administered 2023-08-20: 20 g via ORAL
  Filled 2023-08-20: qty 30

## 2023-08-20 MED ORDER — HEPARIN SODIUM (PORCINE) 1000 UNIT/ML IJ SOLN
INTRAMUSCULAR | Status: AC
  Filled 2023-08-20: qty 10

## 2023-08-20 NOTE — Progress Notes (Signed)
 No documented BM since 3/16. MD and primary RN notified; PRN senna available.

## 2023-08-20 NOTE — Progress Notes (Signed)
 Hemodialysis Note:  Received patient in bed to unit. Alert and oriented. Informed consent singed and in chart.  Treatment initiated: 0805 Treatment completed: 1152  Access used: Right Subclavian catheter Access issues: None  Patient tolerated well. Transported back to room, alert without acute distress. Report given to patient's RN.  Total UF removed: 2 Liters Medications given: Epogen 10000 units IV  Post HD weight: 65.4 Kg  [facility]

## 2023-08-20 NOTE — Progress Notes (Signed)
 Progress Note   Patient: Allison Hill:[id number] DOB: 1971-04-23 DOA: 08/09/2023     0 DOS: the patient was seen and examined on 08/20/2023   Brief hospital course: Allison Hill is a 53 y.o. female with medical history significant of ESRD on HD MWF, refractory HTN, chronic HFpEF, chronic iron deficiency anemia admitted to hospitalist service for evaluation of chest pain, hypertensive urgency.  Patient's chest pain atypical, troponins negative.  Blood pressure improved with home regimen.  Patient lives with her daughter who does not want to take her home given high risk for falls, not safe for her HD transportation.  HD outpatient facility arranged.  TOC working on placement   On 08/13/2023/08/14/2023 patient is complaining of severe pain in her right ear that is not controlled by tylenol. The pain has not improved with IV Unasyn. CT head demonstrated mastoiditis and otitis media with swelling of the external canal. The patient was given IV Fentanyl for pain and antibiotics were expanded to include cipro PO. She will undergo dialysis on Saturday as part of her usual schedule.  PT/OT recommended nursing home placement, however insurance company denied the application.   Principal Problem:   Chest pain Active Problems:   Otitis media not resolved, right   Hypertensive urgency   ESRD on dialysis (HCC)   Right upper quadrant abdominal pain   Assessment and Plan:  Otitis media not resolved, right Severe. Cipro added for broader coverage as the patient has not improved with unasyn alone. Fentanyl for pain control. Blood cultures x 2 drawn. Cultures of the yellow fluid coming out of external canal is cultured. It has grown strep pneumoniae species that appears to be pan sensitive.  She was initially treated with Unasyn, then changed to Cipro 4 days ago. Complete course today.    Hypertensive urgency The patient's blood pressure is improved with amlodipine, losartan, imdur, terazosyn, and  hydralazine.    ESRD on dialysis (HCC) Hyperkalemia hypocalcemia On scheduled HD. K 5.4, received HD again. Add calcium.    Chest pain Resolved. Due to elevated pressures/musculoskeletal issues.    Right upper quadrant abdominal pain Although the patient continues to complain of this discomfort, abdominal exam does not reflect this. US of the RUQ of the abdomen is negative for cholecystitis or cholelithiasis.        Subjective: Doing well, no abdominal pain, normal bm  Physical Exam: Vitals:   08/20/23 1030 08/20/23 1100 08/20/23 1152 08/20/23 1200  BP: (!) 146/107 (!) 154/96 (!) 178/103 (!) 168/113  Pulse: 84 78 74 86  Resp: 13 10 20 17   Temp:      TempSrc:      SpO2: 100% 100% 97% 99%  Weight:    65.4 kg  Height:       General exam: Appears calm and comfortable  Respiratory system: Clear to auscultation. Respiratory effort normal. Cardiovascular system: S1 & S2 heard, RRR. No JVD, murmurs, rubs, gallops or clicks. No pedal edema. Gastrointestinal system: Abdomen is nondistended, soft and nontender. No organomegaly or masses felt. Normal bowel sounds heard. Central nervous system: Alert and oriented. No focal neurological deficits. Extremities: Symmetric 5 x 5 power. Skin: No rashes, lesions or ulcers Psychiatry: Judgement and insight appear normal. Mood & affect appropriate.    Data Reviewed:  Lab results revealed/   Family Communication: None  Disposition: Status is: Observation      Time spent: 35 minutes  Author: Marrion Coy, MD 08/20/2023 2:26 PM  For on call review [email or web address].

## 2023-08-20 NOTE — Plan of Care (Signed)
  Problem: Activity: Goal: Ability to tolerate increased activity will improve Outcome: Progressing   Problem: Cardiac: Goal: Ability to achieve and maintain adequate cardiovascular perfusion will improve Outcome: Progressing   Problem: Health Behavior/Discharge Planning: Goal: Ability to safely manage health-related needs after discharge will improve Outcome: Progressing   

## 2023-08-20 NOTE — Progress Notes (Signed)
 [facility]  ROUNDING NOTE   Subjective:   Patient seen and evaluated during dialysis   HEMODIALYSIS FLOWSHEET:  Blood Flow Rate (mL/min): 0 mL/min Arterial Pressure (mmHg): -24.24 mmHg Venous Pressure (mmHg): 37.37 mmHg TMP (mmHg): 5.86 mmHg Ultrafiltration Rate (mL/min): 994 mL/min Dialysate Flow Rate (mL/min): 300 ml/min Dialysis Fluid Bolus: Normal Saline  Tolerating treatment well.  Discharge planning in progress.    Objective:  Vital signs in last 24 hours:  Temp:  [97.6 F (36.4 C)-98.7 F (37.1 C)] 98.5 F (36.9 C) (03/20 0755) Pulse Rate:  [69-97] 74 (03/20 1152) Resp:  [10-20] 20 (03/20 1152) BP: (106-178)/(64-107) 178/103 (03/20 1152) SpO2:  [97 %-100 %] 97 % (03/20 1152) Weight:  [67.3 kg-67.5 kg] 67.3 kg (03/20 0755)  Weight change: -0.859 kg Filed Weights   08/18/23 1215 08/20/23 0503 08/20/23 0755  Weight: 66.5 kg 67.5 kg 67.3 kg    Intake/Output: I/O last 3 completed shifts: In: 840 [P.O.:840] Out: -    Intake/Output this shift:  Total I/O In: -  Out: 2000 [Other:2000]  Physical Exam: General: NAD  Head: Normocephalic, atraumatic. Moist oral mucosal membranes  Eyes: Anicteric  Lungs:  Clear to auscultation  Heart: Regular rate and rhythm  Abdomen:  Soft, nontender  Extremities: No peripheral edema.  Neurologic: Alert, moving all four extremities  Skin: No lesions  Access: Rt Chest Permcath    Basic Metabolic Panel: Recent Labs  Lab 08/15/23 0330 08/16/23 0311 08/18/23 0357 08/19/23 0334 08/20/23 0808  NA 134* 135 134* 134* 134*  K 4.5 4.8 5.7* 4.3 5.4*  CL 99 99 99 94* 98  CO2 22 22 19* 23 26  GLUCOSE 104* 122* 115* 123* 69*  BUN 104* 80* 128* 78* 108*  CREATININE 8.10* 5.59* 8.52* 5.03* 7.19*  CALCIUM 7.4* 7.9* 7.0* 7.6* 6.4*  PHOS  --   --   --  5.5* 6.1*    Liver Function Tests: Recent Labs  Lab 08/19/23 0334 08/20/23 0808  ALBUMIN 3.9 3.3*    No results for input(s): "LIPASE", "AMYLASE" in the  last 168 hours. No results for input(s): "AMMONIA" in the last 168 hours.  CBC: Recent Labs  Lab 08/14/23 0531 08/15/23 0330 08/16/23 0311 08/18/23 0357 08/20/23 0808  WBC 9.4 8.8 8.1 8.2 8.4  NEUTROABS 7.5  --   --   --   --   HGB 9.8* 8.2* 8.4* 7.9* 8.4*  HCT 29.4* 24.3* 24.9* 23.8* 25.4*  MCV 81.4 82.7 82.2 82.6 83.8  PLT 192 194 200 235 260    Cardiac Enzymes: No results for input(s): "CKTOTAL", "CKMB", "CKMBINDEX", "TROPONINI" in the last 168 hours.  BNP: Invalid input(s): "POCBNP"  CBG: No results for input(s): "GLUCAP" in the last 168 hours.  Microbiology: Results for orders placed or performed during the hospital encounter of 08/09/23  Resp panel by RT-PCR (RSV, Flu A&B, Covid) Anterior Nasal Swab     Status: None   Collection Time: 08/14/23  3:44 AM   Specimen: Anterior Nasal Swab  Result Value Ref Range Status   SARS Coronavirus 2 by RT PCR NEGATIVE NEGATIVE Final    Comment: (NOTE) SARS-CoV-2 target nucleic acids are NOT DETECTED.  The SARS-CoV-2 RNA is generally detectable in upper respiratory specimens during the acute phase of infection. The lowest concentration of SARS-CoV-2 viral copies this assay can detect is 138 copies/mL. A negative result does not preclude SARS-Cov-2 infection and should not be used as the sole basis for treatment or other patient management decisions. A  negative result may occur with  improper specimen collection/handling, submission of specimen other than nasopharyngeal swab, presence of viral mutation(s) within the areas targeted by this assay, and inadequate number of viral copies(<138 copies/mL). A negative result must be combined with clinical observations, patient history, and epidemiological information. The expected result is Negative.  Fact Sheet for Patients:  BloggerCourse.com  Fact Sheet for Healthcare Providers:  SeriousBroker.it  This test is no t yet approved  or cleared by the [place] FDA and  has been authorized for detection and/or diagnosis of SARS-CoV-2 by FDA under an Emergency Use Authorization (EUA). This EUA will remain  in effect (meaning this test can be used) for the duration of the COVID-19 declaration under Section 564(b)(1) of the Act, 21 U.S.C.section 360bbb-3(b)(1), unless the authorization is terminated  or revoked sooner.       Influenza A by PCR NEGATIVE NEGATIVE Final   Influenza B by PCR NEGATIVE NEGATIVE Final    Comment: (NOTE) The Xpert Xpress SARS-CoV-2/FLU/RSV plus assay is intended as an aid in the diagnosis of influenza from Nasopharyngeal swab specimens and should not be used as a sole basis for treatment. Nasal washings and aspirates are unacceptable for Xpert Xpress SARS-CoV-2/FLU/RSV testing.  Fact Sheet for Patients: BloggerCourse.com  Fact Sheet for Healthcare Providers: SeriousBroker.it  This test is not yet approved or cleared by the [place] FDA and has been authorized for detection and/or diagnosis of SARS-CoV-2 by FDA under an Emergency Use Authorization (EUA). This EUA will remain in effect (meaning this test can be used) for the duration of the COVID-19 declaration under Section 564(b)(1) of the Act, 21 U.S.C. section 360bbb-3(b)(1), unless the authorization is terminated or revoked.     Resp Syncytial Virus by PCR NEGATIVE NEGATIVE Final    Comment: (NOTE) Fact Sheet for Patients: BloggerCourse.com  Fact Sheet for Healthcare Providers: SeriousBroker.it  This test is not yet approved or cleared by the [place] FDA and has been authorized for detection and/or diagnosis of SARS-CoV-2 by FDA under an Emergency Use Authorization (EUA). This EUA will remain in effect (meaning this test can be used) for the duration of the COVID-19 declaration under Section 564(b)(1) of the Act,  21 U.S.C. section 360bbb-3(b)(1), unless the authorization is terminated or revoked.  Performed at [facility], [address] [place], [place] [address]   Respiratory (~20 pathogens) panel by PCR     Status: None   Collection Time: 08/14/23  3:44 AM   Specimen: Nasopharyngeal Swab; Respiratory  Result Value Ref Range Status   Adenovirus NOT DETECTED NOT DETECTED Final   Coronavirus 229E NOT DETECTED NOT DETECTED Final    Comment: (NOTE) The Coronavirus on the Respiratory Panel, DOES NOT test for the novel  Coronavirus (2019 nCoV)    Coronavirus HKU1 NOT DETECTED NOT DETECTED Final   Coronavirus NL63 NOT DETECTED NOT DETECTED Final   Coronavirus OC43 NOT DETECTED NOT DETECTED Final   Metapneumovirus NOT DETECTED NOT DETECTED Final   Rhinovirus / Enterovirus NOT DETECTED NOT DETECTED Final   Influenza A NOT DETECTED NOT DETECTED Final   Influenza B NOT DETECTED NOT DETECTED Final   Parainfluenza Virus 1 NOT DETECTED NOT DETECTED Final   Parainfluenza Virus 2 NOT DETECTED NOT DETECTED Final   Parainfluenza Virus 3 NOT DETECTED NOT DETECTED Final   Parainfluenza Virus 4 NOT DETECTED NOT DETECTED Final   Respiratory Syncytial Virus NOT DETECTED NOT DETECTED Final   Bordetella pertussis NOT DETECTED NOT DETECTED Final   Bordetella  Parapertussis NOT DETECTED NOT DETECTED Final   Chlamydophila pneumoniae NOT DETECTED NOT DETECTED Final   Mycoplasma pneumoniae NOT DETECTED NOT DETECTED Final    Comment: Performed at [facility], [address] [place], [place] [address]  Culture, blood (Routine X 2) w Reflex to ID Panel     Status: None   Collection Time: 08/14/23  9:19 AM   Specimen: BLOOD  Result Value Ref Range Status   Specimen Description BLOOD BLOOD RIGHT HAND  Final   Special Requests   Final    BOTTLES DRAWN AEROBIC AND ANAEROBIC Blood Culture adequate volume   Culture   Final    NO GROWTH 5 DAYS Performed at [facility], [address]  [place], [place] [address]    Report Status 08/19/2023 FINAL  Final  Culture, blood (Routine X 2) w Reflex to ID Panel     Status: None   Collection Time: 08/14/23  9:19 AM   Specimen: BLOOD  Result Value Ref Range Status   Specimen Description BLOOD BLOOD LEFT HAND  Final   Special Requests   Final    BOTTLES DRAWN AEROBIC AND ANAEROBIC Blood Culture adequate volume   Culture   Final    NO GROWTH 5 DAYS Performed at [facility], [address] [place], [place] [address]    Report Status 08/19/2023 FINAL  Final  Ear culture     Status: None   Collection Time: 08/14/23 10:11 AM   Specimen: Ear; Other  Result Value Ref Range Status   Specimen Description   Final    EAR Performed at [facility], [address], [place], [place] [address]    Special Requests   Final    RIGHT EAR Performed at [facility], [address] [place], [place] [address]    Culture RARE STREPTOCOCCUS PNEUMONIAE  Final   Report Status 08/16/2023 FINAL  Final   Organism ID, Bacteria STREPTOCOCCUS PNEUMONIAE  Final      Susceptibility   Streptococcus pneumoniae - MIC*    ERYTHROMYCIN <=0.12 SENSITIVE Sensitive     LEVOFLOXACIN 1 SENSITIVE Sensitive     VANCOMYCIN 0.5 SENSITIVE Sensitive     PENICILLIN (meningitis) <=0.06 SENSITIVE Sensitive     PENO - penicillin <=0.06      PENICILLIN (non-meningitis) <=0.06 SENSITIVE Sensitive     PENICILLIN (oral) <=0.06 SENSITIVE Sensitive     CEFTRIAXONE (non-meningitis) <=0.12 SENSITIVE Sensitive     CEFTRIAXONE (meningitis) <=0.12 SENSITIVE Sensitive     * RARE STREPTOCOCCUS PNEUMONIAE    Coagulation Studies: No results for input(s): "LABPROT", "INR" in the last 72 hours.  Urinalysis: No results for input(s): "COLORURINE", "LABSPEC", "PHURINE", "GLUCOSEU", "HGBUR", "BILIRUBINUR", "KETONESUR", "PROTEINUR", "UROBILINOGEN", "NITRITE", "LEUKOCYTESUR" in the last 72 hours.  Invalid input(s): "APPERANCEUR"    Imaging: No results  found.    Medications:     acetaminophen  1,000 mg Oral Q6H   amLODipine  10 mg Oral Daily   aspirin EC  81 mg Oral Daily   atorvastatin  40 mg Oral Daily   Chlorhexidine Gluconate Cloth  6 each Topical Q0600   cinacalcet  30 mg Oral Q supper   epoetin alfa  10,000 Units Intravenous Q T,Th,Sa-HD   fluticasone  1 spray Each Nare Daily   gabapentin  200 mg Oral BID   heparin  5,000 Units Subcutaneous Q12H   hydrALAZINE  100 mg Oral TID   hydrOXYzine  25 mg Oral TID   isosorbide mononitrate  120 mg Oral Daily   labetalol  600 mg Oral BID   lactulose  20 g Oral Once   levofloxacin  250 mg Oral QPM   losartan  100 mg Oral Daily   predniSONE  10 mg Oral 4X daily taper   senna-docusate  2 tablet Oral BID   sevelamer carbonate  1,600 mg Oral TID with meals   terazosin  2 mg Oral BID   ondansetron (ZOFRAN) IV, oxyCODONE  Assessment/ Plan:  [name] is a 53 y.o.  female  with past medical conditions including hypertension, CHF, anemia, and end-stage renal disease on hemodialysis, who was admitted to [facility] on 08/09/2023 for Hypertensive urgency [I16.0] Chest pain [R07.9] Nonspecific chest pain [R07.9] Anemia due to chronic kidney disease, on chronic dialysis (HCC) [N18.6, D63.1, Z99.2]  Outpatient dialysis clinic has been arranged at [facility] on a TTS schedule, chair time at 530 am.     1.  End stage renal disease on hemodialysis.  Patient receiving dialysis today, UF goal 2L as tolerated. Next treatment scheduled for Saturday.     2. Anemia of chronic kidney disease Hemoglobin & Hematocrit     Component Value Date/Time   HGB 8.4 (L) 08/20/2023 0808   HCT 25.4 (L) 08/20/2023 0808    Continue EPO 10000 units with  dialysis treatment.   3. Hypertensive urgency, on admission.  Patient with reasonable blood pressure control now.  Continue amlodipine, hydralazine, labetalol, losartan, and terazosin  Blood pressure elevated during dialysis, 154/96   4.  Secondary Hyperparathyroidism: with outpatient labs: None available Calcium decreased, 6.4. Continue cinacalcet and sevelamer with meals.     LOS: 0 Montario Zilka 3/20/202512:08 PM

## 2023-08-20 NOTE — Plan of Care (Signed)
  Problem: Education: Goal: Knowledge of General Education information will improve Description: Including pain rating scale, medication(s)/side effects and non-pharmacologic comfort measures Outcome: Progressing   Problem: Clinical Measurements: Goal: Respiratory complications will improve Outcome: Progressing   Problem: Clinical Measurements: Goal: Cardiovascular complication will be avoided Outcome: Progressing   Problem: Pain Managment: Goal: General experience of comfort will improve and/or be controlled Outcome: Progressing   Problem: Safety: Goal: Ability to remain free from injury will improve Outcome: Progressing

## 2023-08-20 NOTE — Plan of Care (Signed)

## 2023-08-21 DIAGNOSIS — H6691 Otitis media, unspecified, right ear: Secondary | ICD-10-CM | POA: Diagnosis not present

## 2023-08-21 DIAGNOSIS — Z992 Dependence on renal dialysis: Secondary | ICD-10-CM | POA: Diagnosis not present

## 2023-08-21 DIAGNOSIS — N186 End stage renal disease: Secondary | ICD-10-CM | POA: Diagnosis not present

## 2023-08-21 DIAGNOSIS — I132 Hypertensive heart and chronic kidney disease with heart failure and with stage 5 chronic kidney disease, or end stage renal disease: Secondary | ICD-10-CM | POA: Diagnosis not present

## 2023-08-21 DIAGNOSIS — I160 Hypertensive urgency: Secondary | ICD-10-CM | POA: Diagnosis not present

## 2023-08-21 MED ORDER — PANTOPRAZOLE SODIUM 40 MG PO TBEC
40.0000 mg | DELAYED_RELEASE_TABLET | Freq: Every day | ORAL | Status: DC
  Administered 2023-08-22 – 2023-09-23 (×31): 40 mg via ORAL
  Filled 2023-08-21 (×31): qty 1

## 2023-08-21 NOTE — Progress Notes (Signed)
 PT Cancellation Note  Patient Details Name: Allison Hill MRN: [id number] DOB: March 07, 1971   Cancelled Treatment:    Reason Eval/Treat Not Completed: Other (comment) (Patient declined due to positional dizziness. Will continue with attempts)  Donna Bernard, PT, MPT  Ina Homes 08/21/2023, 2:38 PM

## 2023-08-21 NOTE — Progress Notes (Signed)
 Occupational Therapy Treatment Patient Details Name: Allison Hill MRN: [id number] DOB: 06-22-1970 Today's Date: 08/21/2023   History of present illness Pt is a 53 y.o. female with medical history significant of ESRD on HD MWF, refractory HTN, chronic HFpEF, chronic iron deficiency anemia presented with new onset of chest pain, workup for hypertensive emergency.   OT comments  Upon entering the room, pt supine in bed with no c/o pain and agreeable to OT intervention. Pt obtaining LB clothing from chair and threads onto B feet with figure four position while seated on EOB. Pt stands to pull over B hips with CGA and use of RW. Pt able to twist hair scarf and don with increased time. Pt stands and ambulates ~ 5' with CGA and reports not feeling well. Pt returning to supine and all vitals were WFLs. RN present. Pt reports feeling hot and given cold compress for head per her request. Bed alarm activated and call bell within reach upon exiting the room.       If plan is discharge home, recommend the following:  A little help with walking and/or transfers;A little help with bathing/dressing/bathroom;Assistance with cooking/housework;Assistance with feeding;Direct supervision/assist for medications management;Direct supervision/assist for financial management;Assist for transportation;Help with stairs or ramp for entrance;Supervision due to cognitive status   Equipment Recommendations  None recommended by OT       Precautions / Restrictions Precautions Precautions: Fall Recall of Precautions/Restrictions: Impaired       Mobility Bed Mobility Overal bed mobility: Modified Independent                  Transfers Overall transfer level: Needs assistance Equipment used: Rolling walker (2 wheels) Transfers: Sit to/from Stand Sit to Stand: Supervision                 Balance Overall balance assessment: Needs assistance Sitting-balance support: Feet supported Sitting  balance-Leahy Scale: Good     Standing balance support: Bilateral upper extremity supported, Reliant on assistive device for balance, During functional activity Standing balance-Leahy Scale: Fair                             ADL either performed or assessed with clinical judgement   ADL Overall ADL's : Needs assistance/impaired                     Lower Body Dressing: Contact guard assist;Sit to/from stand                      Extremity/Trunk Assessment Upper Extremity Assessment Upper Extremity Assessment: Generalized weakness   Lower Extremity Assessment Lower Extremity Assessment: Generalized weakness        Vision Patient Visual Report: No change from baseline               Cognition Arousal: Alert Behavior During Therapy: WFL for tasks assessed/performed Cognition: Cognition impaired, No family/caregiver present to determine baseline                                                      Pertinent Vitals/ Pain       Pain Assessment Pain Assessment: No/denies pain         Frequency  Min 2X/week        Progress Toward  Goals  OT Goals(current goals can now be found in the care plan section)  Progress towards OT goals: Progressing toward goals     Plan         AM-PAC OT "6 Clicks" Daily Activity     Outcome Measure   Help from another person eating meals?: None Help from another person taking care of personal grooming?: None Help from another person toileting, which includes using toliet, bedpan, or urinal?: A Little Help from another person bathing (including washing, rinsing, drying)?: A Little Help from another person to put on and taking off regular upper body clothing?: A Little Help from another person to put on and taking off regular lower body clothing?: A Little 6 Click Score: 20    End of Session Equipment Utilized During Treatment: Rolling walker (2 wheels)  OT Visit Diagnosis:  Unsteadiness on feet (R26.81);Other abnormalities of gait and mobility (R26.89);Muscle weakness (generalized) (M62.81)   Activity Tolerance Patient tolerated treatment well   Patient Left in bed;with call bell/phone within reach;with bed alarm set   Nurse Communication Mobility status        Time: 1130-1143 OT Time Calculation (min): 13 min  Charges: OT General Charges $OT Visit: 1 Visit OT Treatments $Self Care/Home Management : 8-22 mins  Jackquline Denmark, MS, OTR/L , CBIS ascom [phone]  08/21/23, 1:39 PM

## 2023-08-21 NOTE — Progress Notes (Signed)
 Progress Note   Patient: Allison Hill BJY:[id number] DOB: 30-Aug-1970 DOA: 08/09/2023     0 DOS: the patient was seen and examined on 08/21/2023   Brief hospital course: Allison Hill is a 53 y.o. female with medical history significant of ESRD on HD MWF, refractory HTN, chronic HFpEF, chronic iron deficiency anemia admitted to hospitalist service for evaluation of chest pain, hypertensive urgency.  Patient's chest pain atypical, troponins negative.  Blood pressure improved with home regimen.  Patient lives with her daughter who does not want to take her home given high risk for falls, not safe for her HD transportation.  HD outpatient facility arranged.  TOC working on placement   On 08/13/2023/08/14/2023 patient is complaining of severe pain in her right ear that is not controlled by tylenol. The pain has not improved with IV Unasyn. CT head demonstrated mastoiditis and otitis media with swelling of the external canal. The patient was given IV Fentanyl for pain and antibiotics were expanded to include cipro PO. She will undergo dialysis on Saturday as part of her usual schedule.  PT/OT recommended nursing home placement, however insurance company denied the application.   Principal Problem:   Chest pain Active Problems:   Otitis media not resolved, right   Hypertensive urgency   ESRD on dialysis (HCC)   Right upper quadrant abdominal pain   Assessment and Plan: Otitis media not resolved, right Severe. Cipro added for broader coverage as the patient has not improved with unasyn alone. Fentanyl for pain control. Blood cultures x 2 drawn. Cultures of the yellow fluid coming out of external canal is cultured. It has grown strep pneumoniae species that appears to be pan sensitive.  She was initially treated with Unasyn, then changed to Cipro 4 days ago.  Antibiotics course completed, symptom resolved.   Hypertensive urgency Orthostatic hypotension. Patient blood pressure running low, also  feel dizzy, discontinued amlodipine.  Continue monitor blood pressure.  ESRD on dialysis [facility]) Hyperkalemia hypocalcemia Scheduled dialysis.   Chest pain Resolved. Due to elevated pressures/musculoskeletal issues.    Right upper quadrant abdominal pain Although the patient continues to complain of this discomfort, abdominal exam does not reflect this. US of the RUQ of the abdomen is negative for cholecystitis or cholelithiasis.  Added Protonix.      Subjective:  Patient has been complaining of dizziness when he is walking, blood pressures are low standing.  Abdominal pain is better.  Physical Exam: Vitals:   08/21/23 1144 08/21/23 1200 08/21/23 1231 08/21/23 1233  BP:  100/61 101/65 (!) 91/56  Pulse:  86 89 91  Resp:      Temp: 98.5 F (36.9 C)     TempSrc:      SpO2:      Weight:      Height:       General exam: Appears calm and comfortable  Respiratory system: Clear to auscultation. Respiratory effort normal. Cardiovascular system: S1 & S2 heard, RRR. No JVD, murmurs, rubs, gallops or clicks. No pedal edema. Gastrointestinal system: Abdomen is nondistended, soft and nontender. No organomegaly or masses felt. Normal bowel sounds heard. Central nervous system: Alert and oriented. No focal neurological deficits. Extremities: Symmetric 5 x 5 power. Skin: No rashes, lesions or ulcers Psychiatry: Judgement and insight appear normal. Mood & affect appropriate.    Data Reviewed:  There are no new results to review at this time.  Family Communication: None  Disposition: Status is: Observation      Time spent: 35 minutes  Author:  Marrion Coy, MD 08/21/2023 1:46 PM  For on call review [email or web address].

## 2023-08-21 NOTE — Plan of Care (Signed)
  Problem: Activity: Goal: Ability to tolerate increased activity will improve Outcome: Progressing   Problem: Education: Goal: Knowledge of General Education information will improve Description: Including pain rating scale, medication(s)/side effects and non-pharmacologic comfort measures Outcome: Progressing   Problem: Clinical Measurements: Goal: Will remain free from infection Outcome: Progressing

## 2023-08-22 DIAGNOSIS — I132 Hypertensive heart and chronic kidney disease with heart failure and with stage 5 chronic kidney disease, or end stage renal disease: Secondary | ICD-10-CM | POA: Diagnosis not present

## 2023-08-22 DIAGNOSIS — R1011 Right upper quadrant pain: Secondary | ICD-10-CM | POA: Diagnosis not present

## 2023-08-22 DIAGNOSIS — I160 Hypertensive urgency: Secondary | ICD-10-CM | POA: Diagnosis not present

## 2023-08-22 DIAGNOSIS — N186 End stage renal disease: Secondary | ICD-10-CM | POA: Diagnosis not present

## 2023-08-22 DIAGNOSIS — H6691 Otitis media, unspecified, right ear: Secondary | ICD-10-CM | POA: Diagnosis not present

## 2023-08-22 LAB — RENAL FUNCTION PANEL
Albumin: 3.1 g/dL — ABNORMAL LOW (ref 3.5–5.0)
Anion gap: 10 (ref 5–15)
BUN: 95 mg/dL — ABNORMAL HIGH (ref 6–20)
CO2: 26 mmol/L (ref 22–32)
Calcium: 7.4 mg/dL — ABNORMAL LOW (ref 8.9–10.3)
Chloride: 97 mmol/L — ABNORMAL LOW (ref 98–111)
Creatinine, Ser: 6.88 mg/dL — ABNORMAL HIGH (ref 0.44–1.00)
GFR, Estimated: 7 mL/min — ABNORMAL LOW (ref >60)
Glucose, Bld: 73 mg/dL (ref 70–99)
Phosphorus: 4.6 mg/dL (ref 2.5–4.6)
Potassium: 5.2 mmol/L — ABNORMAL HIGH (ref 3.5–5.1)
Sodium: 133 mmol/L — ABNORMAL LOW (ref 135–145)

## 2023-08-22 LAB — CBC
HCT: 23.6 % — ABNORMAL LOW (ref 36.0–46.0)
Hemoglobin: 7.7 g/dL — ABNORMAL LOW (ref 12.0–15.0)
MCH: 27.4 pg (ref 26.0–34.0)
MCHC: 32.6 g/dL (ref 30.0–36.0)
MCV: 84 fL (ref 80.0–100.0)
Platelets: 254 10*3/uL (ref 150–400)
RBC: 2.81 MIL/uL — ABNORMAL LOW (ref 3.87–5.11)
RDW: 17.8 % — ABNORMAL HIGH (ref 11.5–15.5)
WBC: 9.5 10*3/uL (ref 4.0–10.5)
nRBC: 0 % (ref 0.0–0.2)

## 2023-08-22 MED ORDER — HEPARIN SODIUM (PORCINE) 1000 UNIT/ML IJ SOLN
INTRAMUSCULAR | Status: AC
  Filled 2023-08-22: qty 10

## 2023-08-22 NOTE — Progress Notes (Addendum)
 Hemodialysis Notes:  Received patient in bed to unit.  Alert and oriented.  Informed consent signed and in chart.   TX duration: 3 hours 8 minutes  Treatment ended early due to high venous pressure alarms and high TMP, indications for clotting. 52 minutes remaining from machine. Dr. Suezanne Jacquet notified. Rinse back blood and end treatment per MD.  HD treatment was tolerated by patient Transported back to the room  Alert, without acute distress.  Hand-off given to patient's nurse.   Access used: Dialysis Catheter Right Access issues: None  Total UF removed: 1.3L Medication(s) given: Epoetin (Epogen) 10,000 units Post HD VS: 138/109 Post HD weight: 65.5kg   Tony Granquist [name]

## 2023-08-22 NOTE — Progress Notes (Signed)
 [facility]  PROGRESS NOTE   Subjective:   Patient seen on dialysis.  Tolerating treatment well.  Attempt to remove fluid today. Vital signs stable.  Objective:  Vital signs: Blood pressure (!) 138/109, pulse 85, temperature 98.5 F (36.9 C), temperature source Oral, resp. rate 17, height 5\' 3"  (1.6 m), weight 65.5 kg, SpO2 100%.  Intake/Output Summary (Last 24 hours) at 08/22/2023 1113 Last data filed at 08/22/2023 1103 Gross per 24 hour  Intake 120 ml  Output 1300 ml  Net -1180 ml   Filed Weights   08/21/23 0502 08/22/23 0745 08/22/23 1103  Weight: 66.2 kg 67 kg 65.5 kg     Physical Exam: General:  No acute distress  Head:  Normocephalic, atraumatic. Moist oral mucosal membranes  Eyes:  Anicteric  Neck:  Supple  Lungs:   Clear to auscultation, normal effort  Heart:  S1S2 no rubs  Abdomen:   Soft, nontender, bowel sounds present  Extremities:  peripheral edema.  Neurologic:  Awake, alert, following commands  Skin:  No lesions  Access:     Basic Metabolic Panel: Recent Labs  Lab 08/16/23 0311 08/18/23 0357 08/19/23 0334 08/20/23 0808 08/22/23 0804  NA 135 134* 134* 134* 133*  K 4.8 5.7* 4.3 5.4* 5.2*  CL 99 99 94* 98 97*  CO2 22 19* 23 26 26   GLUCOSE 122* 115* 123* 69* 73  BUN 80* 128* 78* 108* 95*  CREATININE 5.59* 8.52* 5.03* 7.19* 6.88*  CALCIUM 7.9* 7.0* 7.6* 6.4* 7.4*  PHOS  --   --  5.5* 6.1* 4.6   GFR: Estimated Creatinine Clearance: 8.8 mL/min (A) (by C-G formula based on SCr of 6.88 mg/dL (H)).  Liver Function Tests: Recent Labs  Lab 08/19/23 0334 08/20/23 0808 08/22/23 0804  ALBUMIN 3.9 3.3* 3.1*   No results for input(s): "LIPASE", "AMYLASE" in the last 168 hours. No results for input(s): "AMMONIA" in the last 168 hours.  CBC: Recent Labs  Lab 08/16/23 0311 08/18/23 0357 08/20/23 0808 08/22/23 0804  WBC 8.1 8.2 8.4 9.5  HGB 8.4* 7.9* 8.4* 7.7*  HCT 24.9* 23.8* 25.4* 23.6*  MCV 82.2 82.6 83.8 84.0  PLT 200 235  260 254     HbA1C: No results found for: "HGBA1C"  Urinalysis: No results for input(s): "COLORURINE", "LABSPEC", "PHURINE", "GLUCOSEU", "HGBUR", "BILIRUBINUR", "KETONESUR", "PROTEINUR", "UROBILINOGEN", "NITRITE", "LEUKOCYTESUR" in the last 72 hours.  Invalid input(s): "APPERANCEUR"    Imaging: No results found.   Medications:     acetaminophen  1,000 mg Oral Q6H   aspirin EC  81 mg Oral Daily   atorvastatin  40 mg Oral Daily   calcium carbonate  400 mg of elemental calcium Oral BID   Chlorhexidine Gluconate Cloth  6 each Topical Q0600   cinacalcet  30 mg Oral Q supper   epoetin alfa  10,000 Units Intravenous Q T,Th,Sa-HD   fluticasone  1 spray Each Nare Daily   gabapentin  200 mg Oral BID   heparin  5,000 Units Subcutaneous Q12H   hydrALAZINE  100 mg Oral TID   hydrOXYzine  25 mg Oral TID   isosorbide mononitrate  120 mg Oral Daily   labetalol  600 mg Oral BID   losartan  100 mg Oral Daily   pantoprazole  40 mg Oral Daily   senna-docusate  2 tablet Oral BID   sevelamer carbonate  1,600 mg Oral TID with meals   terazosin  2 mg Oral BID    Assessment/ Plan:  53 y.o.  female  with past medical conditions including hypertension, CHF, anemia, and end-stage renal disease on hemodialysis, who was admitted to [facility] on 08/09/2023 for Hypertensive urgency [I16.0] Chest pain [R07.9] Nonspecific chest pain [R07.9] Anemia due to chronic kidney disease, on chronic dialysis (HCC) [N18.6, D63.1, Z99.2]   Outpatient dialysis clinic has been arranged at [facility] on a TTS schedule, chair time at 530 am.   #1: ESRD: Patient tolerating dialysis well.  Attempt fluid removal today as tolerated.  #2 chronic anemia: Anemia secondary to chronic kidney disease: Continue Epogen and IV iron.  #3: Secondary hyperparathyroidism: Will continue calcium carbonate, sevelamer Cinacalcet as ordered.  #4: Hypertension: Blood pressure is better controlled.  Will continue hydralazine,  Imdur, labetalol, losartan and terazosin.  Labs and medications reviewed. Will continue to follow along with you.   LOS: 0 Lorain Childes, MD [facility] 3/22/202511:13 AM

## 2023-08-22 NOTE — Plan of Care (Signed)

## 2023-08-22 NOTE — Progress Notes (Signed)
 Progress Note   Patient: Allison Hill GNF:[id number] DOB: [birth year] DOA: 08/09/2023     0 DOS: the patient was seen and examined on 08/22/2023   Brief hospital course: Allison Hill is a 53 y.o. female with medical history significant of ESRD on HD MWF, refractory HTN, chronic HFpEF, chronic iron deficiency anemia admitted to hospitalist service for evaluation of chest pain, hypertensive urgency.  Patient's chest pain atypical, troponins negative.  Blood pressure improved with home regimen.  Patient lives with her daughter who does not want to take her home given high risk for falls, not safe for her HD transportation.  HD outpatient facility arranged.  TOC working on placement   On 08/13/2023/08/14/2023 patient is complaining of severe pain in her right ear that is not controlled by tylenol. The pain has not improved with IV Unasyn. CT head demonstrated mastoiditis and otitis media with swelling of the external canal. The patient was given IV Fentanyl for pain and antibiotics were expanded to include cipro PO. She will undergo dialysis on Saturday as part of her usual schedule.  PT/OT recommended nursing home placement, however insurance company denied the application.   Principal Problem:   Chest pain Active Problems:   Otitis media not resolved, right   Hypertensive urgency   ESRD on dialysis (HCC)   Right upper quadrant abdominal pain   Assessment and Plan: Otitis media not resolved, right Severe. Cipro added for broader coverage as the patient has not improved with unasyn alone. Fentanyl for pain control. Blood cultures x 2 drawn. Cultures of the yellow fluid coming out of external canal is cultured. It has grown strep pneumoniae species that appears to be pan sensitive.  She was initially treated with Unasyn, then changed to Cipro 4 days ago.  Antibiotics course completed, symptom resolved.   Hypertensive urgency Orthostatic hypotension. Patient blood pressure running low, also  feel dizzy, discontinued amlodipine.  Continue monitor blood pressure.   ESRD on dialysis [facility]) Hyperkalemia hypocalcemia Scheduled dialysis.   Chest pain Resolved. Due to elevated pressures/musculoskeletal issues.    Right upper quadrant abdominal pain Although the patient continues to complain of this discomfort, abdominal exam does not reflect this. US of the RUQ of the abdomen is negative for cholecystitis or cholelithiasis.  Added Protonix.  Condition improved.      Subjective:  She has no complaint today.  Physical Exam: Vitals:   08/22/23 1000 08/22/23 1030 08/22/23 1058 08/22/23 1103  BP: 139/82 117/82 131/84 (!) 138/109  Pulse: 83 84 82 85  Resp: 12 11 16 17   Temp:    98.5 F (36.9 C)  TempSrc:    Oral  SpO2: 100% 100% 99% 100%  Weight:    65.5 kg  Height:       General exam: Appears calm and comfortable  Respiratory system: Clear to auscultation. Respiratory effort normal. Cardiovascular system: S1 & S2 heard, RRR. No JVD, murmurs, rubs, gallops or clicks. No pedal edema. Gastrointestinal system: Abdomen is nondistended, soft and nontender. No organomegaly or masses felt. Normal bowel sounds heard. Central nervous system: Alert and oriented. No focal neurological deficits. Extremities: Symmetric 5 x 5 power. Skin: No rashes, lesions or ulcers Psychiatry: Judgement and insight appear normal. Mood & affect appropriate.    Data Reviewed:  There are no new results to review at this time.  Family Communication: None  Disposition: Status is: Observation      Time spent: 25 minutes  Author: Marrion Coy, MD 08/22/2023 12:29 PM  For on call  review [email or web address].

## 2023-08-23 DIAGNOSIS — H6691 Otitis media, unspecified, right ear: Secondary | ICD-10-CM | POA: Diagnosis not present

## 2023-08-23 DIAGNOSIS — Z992 Dependence on renal dialysis: Secondary | ICD-10-CM | POA: Diagnosis not present

## 2023-08-23 DIAGNOSIS — I132 Hypertensive heart and chronic kidney disease with heart failure and with stage 5 chronic kidney disease, or end stage renal disease: Secondary | ICD-10-CM | POA: Diagnosis not present

## 2023-08-23 DIAGNOSIS — N186 End stage renal disease: Secondary | ICD-10-CM | POA: Diagnosis not present

## 2023-08-23 DIAGNOSIS — R1011 Right upper quadrant pain: Secondary | ICD-10-CM | POA: Diagnosis not present

## 2023-08-23 MED ORDER — GUAIFENESIN-DM 100-10 MG/5ML PO SYRP: 15.0000 mL | ORAL_SOLUTION | ORAL | Status: DC | PRN

## 2023-08-23 NOTE — Progress Notes (Signed)
 Progress Note   Patient: Allison Hill:[id number] DOB: [birth year] DOA: 08/09/2023     0 DOS: the patient was seen and examined on 08/23/2023   Brief hospital course: Allison Hill is a 53 y.o. female with medical history significant of ESRD on HD MWF, refractory HTN, chronic HFpEF, chronic iron deficiency anemia admitted to hospitalist service for evaluation of chest pain, hypertensive urgency.  Patient's chest pain atypical, troponins negative.  Blood pressure improved with home regimen.  Patient lives with her daughter who does not want to take her home given high risk for falls, not safe for her HD transportation.  HD outpatient facility arranged.  TOC working on placement   On 08/13/2023/08/14/2023 patient is complaining of severe pain in her right ear that is not controlled by tylenol. The pain has not improved with IV Unasyn. CT head demonstrated mastoiditis and otitis media with swelling of the external canal. The patient was given IV Fentanyl for pain and antibiotics were expanded to include cipro PO. She will undergo dialysis on Saturday as part of her usual schedule.  PT/OT recommended nursing home placement, however insurance company denied the application.   Principal Problem:   Chest pain Active Problems:   Otitis media not resolved, right   Hypertensive urgency   ESRD on dialysis (HCC)   Right upper quadrant abdominal pain   Assessment and Plan:  Otitis media not resolved, right Severe. Cipro added for broader coverage as the patient has not improved with unasyn alone. Fentanyl for pain control. Blood cultures x 2 drawn. Cultures of the yellow fluid coming out of external canal is cultured. It has grown strep pneumoniae species that appears to be pan sensitive.  She was initially treated with Unasyn, then changed to Cipro 4 days ago.  Antibiotics course completed, symptom resolved.   Hypertensive urgency Orthostatic hypotension. Patient blood pressure running low,  also feel dizzy, discontinued amlodipine.  Continue monitor blood pressure.   ESRD on dialysis [facility] Hyperkalemia hypocalcemia Scheduled dialysis.   Chest pain Resolved. Due to elevated pressures/musculoskeletal issues.    Right upper quadrant abdominal pain Although the patient continues to complain of this discomfort, abdominal exam does not reflect this. US of the RUQ of the abdomen is negative for cholecystitis or cholelithiasis.  Added Protonix.  Condition improved.   Patient condition stable, pending nursing home placement.  OT has changed their recommendation, ask PT reeval if patient is more suitable for home health.    Subjective:  Patient currently has no complaint.  Physical Exam: Vitals:   08/22/23 2325 08/23/23 0404 08/23/23 0512 08/23/23 0725  BP: 112/77 120/73  (!) 144/78  Pulse: 98 95  87  Resp: 16 16  18   Temp: 98.3 F (36.8 C) 98.2 F (36.8 C)  98.9 F (37.2 C)  TempSrc: Oral Oral    SpO2: 99% 100%  100%  Weight:   66.7 kg   Height:       General exam: Appears calm and comfortable  Respiratory system: Clear to auscultation. Respiratory effort normal. Cardiovascular system: S1 & S2 heard, RRR. No JVD, murmurs, rubs, gallops or clicks. No pedal edema. Gastrointestinal system: Abdomen is nondistended, soft and nontender. No organomegaly or masses felt. Normal bowel sounds heard. Central nervous system: Alert and oriented. No focal neurological deficits. Extremities: Symmetric 5 x 5 power. Skin: No rashes, lesions or ulcers Psychiatry: Judgement and insight appear normal. Mood & affect appropriate.    Data Reviewed:  No new labs.  Family Communication: None  Disposition:  Status is: Observation      Time spent: 35 minutes  Author: Marrion Coy, MD 08/23/2023 11:28 AM  For on call review [email or web address].

## 2023-08-23 NOTE — Plan of Care (Signed)

## 2023-08-23 NOTE — Progress Notes (Signed)
 Physical Therapy  Re-evaluation  Patient Details Name: Allison Hill MRN: [id number] DOB: 12-05-1970 Today's Date: 08/23/2023   History of Present Illness Pt is a 53 y.o. female with medical history significant of ESRD on HD MWF, refractory HTN, chronic HFpEF, chronic iron deficiency anemia presented with new onset of chest pain, workup for hypertensive emergency.    PT Comments  Patient is a pleasant 52 year old female who is progressing with her functional mobility.  Patient is sitting EOB and agreeable to participate with PT. She is able to don pants with supervision and CGA for sit to stand to pull up pants. She requires cueing for safety with ambulation with four episodes where she let go of the walker to touch the walls instead indicating poor safety awareness. Patient returned to room and sat in chair with needs met. Patient will require supervision and assistance for ADLs due to balance deficits, if home support is able to be given may go home but if no home support; she will require SNF due to limited safety awareness.   Pt would benefit from skilled PT to address noted impairments and functional limitations (see below for any additional details).       If plan is discharge home, recommend the following: A little help with walking and/or transfers;A little help with bathing/dressing/bathroom;Assistance with cooking/housework;Assist for transportation;Help with stairs or ramp for entrance   Can travel by private vehicle     Yes  Equipment Recommendations  None recommended by PT    Recommendations for Other Services       Precautions / Restrictions Precautions Precautions: Fall Recall of Precautions/Restrictions: Impaired Restrictions Weight Bearing Restrictions Per Provider Order: No     Mobility  Bed Mobility Overal bed mobility: Modified Independent             General bed mobility comments: seated EOB on arrival    Transfers Overall transfer level: Needs  assistance Equipment used: Rolling walker (2 wheels) Transfers: Sit to/from Stand Sit to Stand: Supervision           General transfer comment: patient able to transition safely with walker set into correct position    Ambulation/Gait Ambulation/Gait assistance: Contact guard assist Gait Distance (Feet): 120 Feet Assistive device: Rolling walker (2 wheels) Gait Pattern/deviations: Step-through pattern, Decreased step length - right, Decreased step length - left Gait velocity: decreased     General Gait Details: occasional hitting of L foot on walker, Four unsafe episodes of letting go of walker with one hand and reaching for wall.   Stairs             Wheelchair Mobility     Tilt Bed    Modified Rankin (Stroke Patients Only)       Balance Overall balance assessment: Needs assistance Sitting-balance support: Feet supported Sitting balance-Leahy Scale: Good     Standing balance support: Bilateral upper extremity supported, Reliant on assistive device for balance, During functional activity Standing balance-Leahy Scale: Fair                              Musician Communication: Impaired Factors Affecting Communication: Difficulty expressing self  Cognition Arousal: Alert Behavior During Therapy: WFL for tasks assessed/performed   PT - Cognitive impairments: No family/caregiver present to determine baseline                         Following commands: Intact Following  commands impaired: Follows multi-step commands inconsistently    Cueing Cueing Techniques: Verbal cues  Exercises Other Exercises Other Exercises: Patient educated on safe negotiation of hallways, donning/doffing clothing    General Comments        Pertinent Vitals/Pain Pain Assessment Pain Assessment: No/denies pain    Home Living                          Prior Function            PT Goals (current goals can now be found in  the care plan section) Acute Rehab PT Goals Patient Stated Goal: to feel better PT Goal Formulation: With patient Time For Goal Achievement: 08/25/23 Potential to Achieve Goals: Good Progress towards PT goals: Progressing toward goals    Frequency    Min 1X/week      PT Plan      Co-evaluation              AM-PAC PT "6 Clicks" Mobility   Outcome Measure  Help needed turning from your back to your side while in a flat bed without using bedrails?: None Help needed moving from lying on your back to sitting on the side of a flat bed without using bedrails?: A Little Help needed moving to and from a bed to a chair (including a wheelchair)?: A Little   Help needed to walk in hospital room?: A Little Help needed climbing 3-5 steps with a railing? : A Little 6 Click Score: 16    End of Session Equipment Utilized During Treatment: Gait belt Activity Tolerance: Patient tolerated treatment well Patient left: in chair;with call bell/phone within reach Nurse Communication: Mobility status PT Visit Diagnosis: Other abnormalities of gait and mobility (R26.89);Difficulty in walking, not elsewhere classified (R26.2);Muscle weakness (generalized) (M62.81)     Time: 6962-9528 PT Time Calculation (min) (ACUTE ONLY): 16 min  Charges:      PT General Charges $$ ACUTE PT VISIT: 1 Visit                    Precious Bard, PT, DPT Physical Therapist - [place]   08/23/2023, 4:13 PM

## 2023-08-23 NOTE — TOC Progression Note (Signed)
 Transition of Care [facility]) - Progression Note    Patient Details  Name: Allison Hill MRN: [id number] Date of Birth: [birth year]  Transition of [facility]) CM/SW Contact  Rodney Langton, RN Phone Number: 08/23/2023, 10:30 AM  Clinical Narrative:     MD inquired about disposition.  Per [facility] Mixer says voided.  Per notes, TOC team working on LTC placement, with contact to [facility].  TOC team will follow up tomorrow on offers and disposition.   Expected Discharge Plan:  (TBD) Barriers to Discharge: Continued Medical Work up  Expected Discharge Plan and Services     Post Acute Care Choice:  (TBD) Living arrangements for the past 2 months: Single Family Home                                       Social Determinants of Health (SDOH) Interventions SDOH Screenings   Food Insecurity: No Food Insecurity (08/10/2023)  Recent Concern: Food Insecurity - High Risk (07/17/2023)   Received from [facility]  Housing: Low Risk  (08/10/2023)  Recent Concern: Housing - High Risk (07/22/2023)   Received from [facility]  Transportation Needs: Unmet Transportation Needs (08/10/2023)  Utilities: Not At Risk (08/10/2023)  Recent Concern: Utilities - Medium Risk (07/17/2023)   Received from [facility]  Financial Resource Strain: Patient Declined (01/03/2023)   Received from [facility]  Social Connections: Unknown (08/10/2023)  Tobacco Use: High Risk (08/10/2023)    Readmission Risk Interventions     No data to display

## 2023-08-23 NOTE — Progress Notes (Signed)
 [facility]  PROGRESS NOTE   Subjective:   Patient seen at bedside.  Poor appetite.  Objective:  Vital signs: Blood pressure (!) 149/96, pulse 85, temperature 97.8 F (36.6 C), resp. rate 18, height 5\' 3"  (1.6 m), weight 66.7 kg, SpO2 98%.  Intake/Output Summary (Last 24 hours) at 08/23/2023 1718 Last data filed at 08/23/2023 1421 Gross per 24 hour  Intake 120 ml  Output --  Net 120 ml   Filed Weights   08/22/23 0745 08/22/23 1103 08/23/23 0512  Weight: 67 kg 65.5 kg 66.7 kg     Physical Exam: General:  No acute distress  Head:  Normocephalic, atraumatic. Moist oral mucosal membranes  Eyes:  Anicteric  Neck:  Supple  Lungs:   Clear to auscultation, normal effort  Heart:  S1S2 no rubs  Abdomen:   Soft, nontender, bowel sounds present  Extremities:  peripheral edema.  Neurologic:  Awake, alert, following commands  Skin:  No lesions  Access:     Basic Metabolic Panel: Recent Labs  Lab 08/18/23 0357 08/19/23 0334 08/20/23 0808 08/22/23 0804  NA 134* 134* 134* 133*  K 5.7* 4.3 5.4* 5.2*  CL 99 94* 98 97*  CO2 19* 23 26 26   GLUCOSE 115* 123* 69* 73  BUN 128* 78* 108* 95*  CREATININE 8.52* 5.03* 7.19* 6.88*  CALCIUM 7.0* 7.6* 6.4* 7.4*  PHOS  --  5.5* 6.1* 4.6   GFR: Estimated Creatinine Clearance: 8.8 mL/min (A) (by C-G formula based on SCr of 6.88 mg/dL (H)).  Liver Function Tests: Recent Labs  Lab 08/19/23 0334 08/20/23 0808 08/22/23 0804  ALBUMIN 3.9 3.3* 3.1*   No results for input(s): "LIPASE", "AMYLASE" in the last 168 hours. No results for input(s): "AMMONIA" in the last 168 hours.  CBC: Recent Labs  Lab 08/18/23 0357 08/20/23 0808 08/22/23 0804  WBC 8.2 8.4 9.5  HGB 7.9* 8.4* 7.7*  HCT 23.8* 25.4* 23.6*  MCV 82.6 83.8 84.0  PLT 235 260 254     HbA1C: No results found for: "HGBA1C"  Urinalysis: No results for input(s): "COLORURINE", "LABSPEC", "PHURINE", "GLUCOSEU", "HGBUR", "BILIRUBINUR", "KETONESUR", "PROTEINUR",  "UROBILINOGEN", "NITRITE", "LEUKOCYTESUR" in the last 72 hours.  Invalid input(s): "APPERANCEUR"    Imaging: No results found.   Medications:     acetaminophen  1,000 mg Oral Q6H   aspirin EC  81 mg Oral Daily   atorvastatin  40 mg Oral Daily   calcium carbonate  400 mg of elemental calcium Oral BID   Chlorhexidine Gluconate Cloth  6 each Topical Q0600   cinacalcet  30 mg Oral Q supper   epoetin alfa  10,000 Units Intravenous Q T,Th,Sa-HD   fluticasone  1 spray Each Nare Daily   gabapentin  200 mg Oral BID   heparin  5,000 Units Subcutaneous Q12H   hydrALAZINE  100 mg Oral TID   hydrOXYzine  25 mg Oral TID   isosorbide mononitrate  120 mg Oral Daily   labetalol  600 mg Oral BID   losartan  100 mg Oral Daily   pantoprazole  40 mg Oral Daily   senna-docusate  2 tablet Oral BID   sevelamer carbonate  1,600 mg Oral TID with meals   terazosin  2 mg Oral BID    Assessment/ Plan:     53 y.o.  female  with past medical conditions including hypertension, CHF, anemia, and end-stage renal disease on hemodialysis, who was admitted to [facility] on 08/09/2023 for Hypertensive urgency [I16.0] Chest pain [R07.9] Nonspecific  chest pain [R07.9] Anemia due to chronic kidney disease, on chronic dialysis (HCC) [N18.6, D63.1, Z99.2]   Outpatient dialysis clinic has been arranged at [facility] on a TTS schedule, chair time at 530 am.    #1: ESRD: Patient tolerating dialysis well.  Tolerated 1 L of fluid removal yesterday.     #2 chronic anemia: Anemia secondary to chronic kidney disease: Continue Epogen and IV iron.   #3: Secondary hyperparathyroidism: Will continue calcium carbonate, sevelamer Cinacalcet as ordered.   #4: Hypertension: Blood pressure is better controlled.  Will continue hydralazine, Imdur, labetalol, losartan and terazosin.  Labs and medications reviewed. Will continue to follow along with you.   LOS: 0 Lorain Childes, MD [facility] 3/23/20255:18 PM

## 2023-08-24 DIAGNOSIS — H6691 Otitis media, unspecified, right ear: Secondary | ICD-10-CM | POA: Diagnosis not present

## 2023-08-24 DIAGNOSIS — Z992 Dependence on renal dialysis: Secondary | ICD-10-CM | POA: Diagnosis not present

## 2023-08-24 DIAGNOSIS — N186 End stage renal disease: Secondary | ICD-10-CM | POA: Diagnosis not present

## 2023-08-24 DIAGNOSIS — I160 Hypertensive urgency: Secondary | ICD-10-CM | POA: Diagnosis not present

## 2023-08-24 DIAGNOSIS — I132 Hypertensive heart and chronic kidney disease with heart failure and with stage 5 chronic kidney disease, or end stage renal disease: Secondary | ICD-10-CM | POA: Diagnosis not present

## 2023-08-24 NOTE — Progress Notes (Signed)
 Progress Note   Patient: Allison Hill ZOX:[id number] DOB: Jan 27, 1971 DOA: 08/09/2023     0 DOS: the patient was seen and examined on 08/24/2023   Brief hospital course: Lavinia Mcneely is a 53 y.o. female with medical history significant of ESRD on HD MWF, refractory HTN, chronic HFpEF, chronic iron deficiency anemia admitted to hospitalist service for evaluation of chest pain, hypertensive urgency.  Patient's chest pain atypical, troponins negative.  Blood pressure improved with home regimen.  Patient lives with her daughter who does not want to take her home given high risk for falls, not safe for her HD transportation.  HD outpatient facility arranged.  TOC working on placement   On 08/13/2023/08/14/2023 patient is complaining of severe pain in her right ear that is not controlled by tylenol. The pain has not improved with IV Unasyn. CT head demonstrated mastoiditis and otitis media with swelling of the external canal. The patient was given IV Fentanyl for pain and antibiotics were expanded to include cipro PO. She will undergo dialysis on Saturday as part of her usual schedule.  PT/OT recommended nursing home placement, however insurance company denied the application.   Principal Problem:   Chest pain Active Problems:   Otitis media not resolved, right   Hypertensive urgency   ESRD on dialysis (HCC)   Right upper quadrant abdominal pain   Assessment and Plan: Otitis media not resolved, right Severe. Cipro added for broader coverage as the patient has not improved with unasyn alone. Fentanyl for pain control. Blood cultures x 2 drawn. Cultures of the yellow fluid coming out of external canal is cultured. It has grown strep pneumoniae species that appears to be pan sensitive.  She was initially treated with Unasyn, then changed to Cipro 4 days ago.  Antibiotics course completed, symptom resolved.   Hypertensive urgency Orthostatic hypotension. Patient blood pressure running low, also  feel dizzy, discontinued amlodipine.  Continue monitor blood pressure.   ESRD on dialysis [facility]) Hyperkalemia hypocalcemia Scheduled dialysis.   Chest pain Resolved. Due to elevated pressures/musculoskeletal issues.    Right upper quadrant abdominal pain Although the patient continues to complain of this discomfort, abdominal exam does not reflect this. US of the RUQ of the abdomen is negative for cholecystitis or cholelithiasis.  Added Protonix.  Condition improved.  Discussed with TOC, patient currently has no bed offer.  Will continue PT/OT, if she has improved enough and PT/OT recommend home health, patient might had to go home with home health.  Currently his daughter does not want patient to go home, but the patient has capacity to make her own decision.    Subjective: Doing better today, no short of breath or cough.  Physical Exam: Vitals:   08/24/23 0007 08/24/23 0404 08/24/23 0803 08/24/23 1150  BP: 138/86 (!) 153/91 (!) 146/96 133/87  Pulse: 92 88 85 91  Resp: 17 19    Temp: 99.1 F (37.3 C) 98.8 F (37.1 C) 98.2 F (36.8 C) 98.2 F (36.8 C)  TempSrc: Oral Oral Oral   SpO2: 97% 100% 100% 100%  Weight:      Height:       General exam: Appears calm and comfortable  Respiratory system: Clear to auscultation. Respiratory effort normal. Cardiovascular system: S1 & S2 heard, RRR. No JVD, murmurs, rubs, gallops or clicks. No pedal edema. Gastrointestinal system: Abdomen is nondistended, soft and nontender. No organomegaly or masses felt. Normal bowel sounds heard. Central nervous system: Alert and oriented. No focal neurological deficits. Extremities: Symmetric 5 x  5 power. Skin: No rashes, lesions or ulcers Psychiatry: Judgement and insight appear normal. Mood & affect appropriate.    Data Reviewed:  No lab results.  Family Communication: None  Disposition: Status is: Observation      Time spent: 35 minutes  Author: Marrion Coy, MD 08/24/2023 12:40  PM  For on call review [email or web address].

## 2023-08-24 NOTE — Progress Notes (Signed)
 [facility]  ROUNDING NOTE   Subjective:   Patient seen laying in bed Completed breakfast tray at bedside No complaints  Discharge planning.  Objective:  Vital signs in last 24 hours:  Temp:  [97.8 F (36.6 C)-99.1 F (37.3 C)] 98.2 F (36.8 C) (03/24 1150) Pulse Rate:  [85-95] 91 (03/24 1150) Resp:  [17-19] 19 (03/24 0404) BP: (121-153)/(76-96) 133/87 (03/24 1150) SpO2:  [97 %-100 %] 100 % (03/24 1150)  Weight change:  Filed Weights   08/22/23 0745 08/22/23 1103 08/23/23 0512  Weight: 67 kg 65.5 kg 66.7 kg    Intake/Output: I/O last 3 completed shifts: In: 120 [P.O.:120] Out: -    Intake/Output this shift:  Total I/O In: 360 [P.O.:360] Out: -   Physical Exam: General: NAD  Head: Normocephalic, atraumatic. Moist oral mucosal membranes  Eyes: Anicteric  Lungs:  Clear to auscultation  Heart: Regular rate and rhythm  Abdomen:  Soft, nontender  Extremities: No peripheral edema.  Neurologic: Alert, moving all four extremities  Skin: No lesions  Access: Rt Chest Permcath    Basic Metabolic Panel: Recent Labs  Lab 08/18/23 0357 08/19/23 0334 08/20/23 0808 08/22/23 0804  NA 134* 134* 134* 133*  K 5.7* 4.3 5.4* 5.2*  CL 99 94* 98 97*  CO2 19* 23 26 26   GLUCOSE 115* 123* 69* 73  BUN 128* 78* 108* 95*  CREATININE 8.52* 5.03* 7.19* 6.88*  CALCIUM 7.0* 7.6* 6.4* 7.4*  PHOS  --  5.5* 6.1* 4.6    Liver Function Tests: Recent Labs  Lab 08/19/23 0334 08/20/23 0808 08/22/23 0804  ALBUMIN 3.9 3.3* 3.1*    No results for input(s): "LIPASE", "AMYLASE" in the last 168 hours. No results for input(s): "AMMONIA" in the last 168 hours.  CBC: Recent Labs  Lab 08/18/23 0357 08/20/23 0808 08/22/23 0804  WBC 8.2 8.4 9.5  HGB 7.9* 8.4* 7.7*  HCT 23.8* 25.4* 23.6*  MCV 82.6 83.8 84.0  PLT 235 260 254    Cardiac Enzymes: No results for input(s): "CKTOTAL", "CKMB", "CKMBINDEX", "TROPONINI" in the last 168 hours.  BNP: Invalid input(s):  "POCBNP"  CBG: No results for input(s): "GLUCAP" in the last 168 hours.  Microbiology: Results for orders placed or performed during the hospital encounter of 08/09/23  Resp panel by RT-PCR (RSV, Flu A&B, Covid) Anterior Nasal Swab     Status: None   Collection Time: 08/14/23  3:44 AM   Specimen: Anterior Nasal Swab  Result Value Ref Range Status   SARS Coronavirus 2 by RT PCR NEGATIVE NEGATIVE Final    Comment: (NOTE) SARS-CoV-2 target nucleic acids are NOT DETECTED.  The SARS-CoV-2 RNA is generally detectable in upper respiratory specimens during the acute phase of infection. The lowest concentration of SARS-CoV-2 viral copies this assay can detect is 138 copies/mL. A negative result does not preclude SARS-Cov-2 infection and should not be used as the sole basis for treatment or other patient management decisions. A negative result may occur with  improper specimen collection/handling, submission of specimen other than nasopharyngeal swab, presence of viral mutation(s) within the areas targeted by this assay, and inadequate number of viral copies(<138 copies/mL). A negative result must be combined with clinical observations, patient history, and epidemiological information. The expected result is Negative.  Fact Sheet for Patients:  BloggerCourse.com  Fact Sheet for Healthcare Providers:  SeriousBroker.it  This test is no t yet approved or cleared by the [place] FDA and  has been authorized for detection and/or diagnosis of SARS-CoV-2  by FDA under an Emergency Use Authorization (EUA). This EUA will remain  in effect (meaning this test can be used) for the duration of the COVID-19 declaration under Section 564(b)(1) of the Act, 21 U.S.C.section 360bbb-3(b)(1), unless the authorization is terminated  or revoked sooner.       Influenza A by PCR NEGATIVE NEGATIVE Final   Influenza B by PCR NEGATIVE NEGATIVE Final     Comment: (NOTE) The Xpert Xpress SARS-CoV-2/FLU/RSV plus assay is intended as an aid in the diagnosis of influenza from Nasopharyngeal swab specimens and should not be used as a sole basis for treatment. Nasal washings and aspirates are unacceptable for Xpert Xpress SARS-CoV-2/FLU/RSV testing.  Fact Sheet for Patients: BloggerCourse.com  Fact Sheet for Healthcare Providers: SeriousBroker.it  This test is not yet approved or cleared by the [place] FDA and has been authorized for detection and/or diagnosis of SARS-CoV-2 by FDA under an Emergency Use Authorization (EUA). This EUA will remain in effect (meaning this test can be used) for the duration of the COVID-19 declaration under Section 564(b)(1) of the Act, 21 U.S.C. section 360bbb-3(b)(1), unless the authorization is terminated or revoked.     Resp Syncytial Virus by PCR NEGATIVE NEGATIVE Final    Comment: (NOTE) Fact Sheet for Patients: BloggerCourse.com  Fact Sheet for Healthcare Providers: SeriousBroker.it  This test is not yet approved or cleared by the [place] FDA and has been authorized for detection and/or diagnosis of SARS-CoV-2 by FDA under an Emergency Use Authorization (EUA). This EUA will remain in effect (meaning this test can be used) for the duration of the COVID-19 declaration under Section 564(b)(1) of the Act, 21 U.S.C. section 360bbb-3(b)(1), unless the authorization is terminated or revoked.  Performed at [facility], [address] [place], [place] [address]   Respiratory (~20 pathogens) panel by PCR     Status: None   Collection Time: 08/14/23  3:44 AM   Specimen: Nasopharyngeal Swab; Respiratory  Result Value Ref Range Status   Adenovirus NOT DETECTED NOT DETECTED Final   Coronavirus 229E NOT DETECTED NOT DETECTED Final    Comment: (NOTE) The Coronavirus on the Respiratory  Panel, DOES NOT test for the novel  Coronavirus (2019 nCoV)    Coronavirus HKU1 NOT DETECTED NOT DETECTED Final   Coronavirus NL63 NOT DETECTED NOT DETECTED Final   Coronavirus OC43 NOT DETECTED NOT DETECTED Final   Metapneumovirus NOT DETECTED NOT DETECTED Final   Rhinovirus / Enterovirus NOT DETECTED NOT DETECTED Final   Influenza A NOT DETECTED NOT DETECTED Final   Influenza B NOT DETECTED NOT DETECTED Final   Parainfluenza Virus 1 NOT DETECTED NOT DETECTED Final   Parainfluenza Virus 2 NOT DETECTED NOT DETECTED Final   Parainfluenza Virus 3 NOT DETECTED NOT DETECTED Final   Parainfluenza Virus 4 NOT DETECTED NOT DETECTED Final   Respiratory Syncytial Virus NOT DETECTED NOT DETECTED Final   Bordetella pertussis NOT DETECTED NOT DETECTED Final   Bordetella Parapertussis NOT DETECTED NOT DETECTED Final   Chlamydophila pneumoniae NOT DETECTED NOT DETECTED Final   Mycoplasma pneumoniae NOT DETECTED NOT DETECTED Final    Comment: Performed at [facility], [address] [place], [place] [address]  Culture, blood (Routine X 2) w Reflex to ID Panel     Status: None   Collection Time: 08/14/23  9:19 AM   Specimen: BLOOD  Result Value Ref Range Status   Specimen Description BLOOD BLOOD RIGHT HAND  Final   Special Requests   Final  BOTTLES DRAWN AEROBIC AND ANAEROBIC Blood Culture adequate volume   Culture   Final    NO GROWTH 5 DAYS Performed at [facility], [address] [place], [place] [address]    Report Status 08/19/2023 FINAL  Final  Culture, blood (Routine X 2) w Reflex to ID Panel     Status: None   Collection Time: 08/14/23  9:19 AM   Specimen: BLOOD  Result Value Ref Range Status   Specimen Description BLOOD BLOOD LEFT HAND  Final   Special Requests   Final    BOTTLES DRAWN AEROBIC AND ANAEROBIC Blood Culture adequate volume   Culture   Final    NO GROWTH 5 DAYS Performed at [facility], [address] [place], [place] [address]     Report Status 08/19/2023 FINAL  Final  Ear culture     Status: None   Collection Time: 08/14/23 10:11 AM   Specimen: Ear; Other  Result Value Ref Range Status   Specimen Description   Final    EAR Performed at [facility], [address] [place], [place] [address]    Special Requests   Final    RIGHT EAR Performed at [facility], [address] [place], [place] [address]    Culture RARE STREPTOCOCCUS PNEUMONIAE  Final   Report Status 08/16/2023 FINAL  Final   Organism ID, Bacteria STREPTOCOCCUS PNEUMONIAE  Final      Susceptibility   Streptococcus pneumoniae - MIC*    ERYTHROMYCIN <=0.12 SENSITIVE Sensitive     LEVOFLOXACIN 1 SENSITIVE Sensitive     VANCOMYCIN 0.5 SENSITIVE Sensitive     PENICILLIN (meningitis) <=0.06 SENSITIVE Sensitive     PENO - penicillin <=0.06      PENICILLIN (non-meningitis) <=0.06 SENSITIVE Sensitive     PENICILLIN (oral) <=0.06 SENSITIVE Sensitive     CEFTRIAXONE (non-meningitis) <=0.12 SENSITIVE Sensitive     CEFTRIAXONE (meningitis) <=0.12 SENSITIVE Sensitive     * RARE STREPTOCOCCUS PNEUMONIAE    Coagulation Studies: No results for input(s): "LABPROT", "INR" in the last 72 hours.  Urinalysis: No results for input(s): "COLORURINE", "LABSPEC", "PHURINE", "GLUCOSEU", "HGBUR", "BILIRUBINUR", "KETONESUR", "PROTEINUR", "UROBILINOGEN", "NITRITE", "LEUKOCYTESUR" in the last 72 hours.  Invalid input(s): "APPERANCEUR"    Imaging: No results found.    Medications:     acetaminophen  1,000 mg Oral Q6H   aspirin EC  81 mg Oral Daily   atorvastatin  40 mg Oral Daily   calcium carbonate  400 mg of elemental calcium Oral BID   Chlorhexidine Gluconate Cloth  6 each Topical Q0600   cinacalcet  30 mg Oral Q supper   epoetin alfa  10,000 Units Intravenous Q T,Th,Sa-HD   fluticasone  1 spray Each Nare Daily   gabapentin  200 mg Oral BID   heparin  5,000 Units Subcutaneous Q12H   hydrALAZINE  100 mg Oral TID   hydrOXYzine  25 mg  Oral TID   isosorbide mononitrate  120 mg Oral Daily   labetalol  600 mg Oral BID   losartan  100 mg Oral Daily   pantoprazole  40 mg Oral Daily   senna-docusate  2 tablet Oral BID   sevelamer carbonate  1,600 mg Oral TID with meals   terazosin  2 mg Oral BID   guaiFENesin-dextromethorphan, ondansetron (ZOFRAN) IV, oxyCODONE  Assessment/ Plan:  [name] is a 53 y.o.  female  with past medical conditions including hypertension, CHF, anemia, and end-stage renal disease on hemodialysis, who was  admitted to [facility] on 08/09/2023 for Hypertensive urgency [I16.0] Chest pain [R07.9] Nonspecific chest pain [R07.9] Anemia due to chronic kidney disease, on chronic dialysis (HCC) [N18.6, D63.1, Z99.2]  Outpatient dialysis clinic has been arranged at [facility] on a TTS schedule, chair time at 530 am.     1.  End stage renal disease on hemodialysis.  Next treatment scheduled for Tuesday.    2. Anemia of chronic kidney disease Hemoglobin & Hematocrit     Component Value Date/Time   HGB 7.7 (L) 08/22/2023 0804   HCT 23.6 (L) 08/22/2023 0804  Hgb below optimal range. Continue EPO 10000 units with  dialysis treatment.   3. Hypertensive urgency, on admission.  Patient with reasonable blood pressure control now.  Continue amlodipine, hydralazine, labetalol, losartan, and terazosin  Blood pressure 133/87   4. Secondary Hyperparathyroidism: with outpatient labs: None available Calcium improved from 7.4 to 6.4. Will correct with dialysis.     LOS: 0 Cam Harnden 3/24/20252:31 PM

## 2023-08-24 NOTE — TOC Progression Note (Addendum)
 Transition of Care [facility]) - Progression Note    Patient Details  Name: Allison Hill MRN: [id number] Date of Birth: 1970/08/18  [facility] CM/SW Contact  Truddie Hidden, RN Phone Number: 08/24/2023, 10:15 AM  Clinical Narrative:    Sherron Monday with Kenney Houseman from [facility] regarding LTC placement. Referral still being reviewed.   1:30pm Referral sent to [facility] @ [facility] for ALF assistance.   3:00pm Spoke with patient and her daughter at bedside. Patient's daughter called at her request. RNCM advised patient's daughter she does not qualify for STR. She was advised patient could discharge home with [facility]. She is adamant patient will not discharge to her home. Patient advised [facility] had been contact for assistance with ALF.      Expected Discharge Plan:  (TBD) Barriers to Discharge: Continued Medical Work up  Expected Discharge Plan and Services     Post Acute Care Choice:  (TBD) Living arrangements for the past 2 months: Single Family Home                                       Social Determinants of Health (SDOH) Interventions SDOH Screenings   Food Insecurity: No Food Insecurity (08/10/2023)  Recent Concern: Food Insecurity - High Risk (07/17/2023)   Received from [facility]  Housing: Low Risk  (08/10/2023)  Recent Concern: Housing - High Risk (07/22/2023)   Received from [facility]  Transportation Needs: Unmet Transportation Needs (08/10/2023)  Utilities: Not At Risk (08/10/2023)  Recent Concern: Utilities - Medium Risk (07/17/2023)   Received from [facility]  Financial Resource Strain: Patient Declined (01/03/2023)   Received from [facility]  Social Connections: Unknown (08/10/2023)  Tobacco Use: High Risk (08/10/2023)    Readmission Risk Interventions     No data to display

## 2023-08-25 DIAGNOSIS — R1011 Right upper quadrant pain: Secondary | ICD-10-CM | POA: Diagnosis not present

## 2023-08-25 DIAGNOSIS — H6691 Otitis media, unspecified, right ear: Secondary | ICD-10-CM | POA: Diagnosis not present

## 2023-08-25 DIAGNOSIS — N186 End stage renal disease: Secondary | ICD-10-CM | POA: Diagnosis not present

## 2023-08-25 DIAGNOSIS — Z992 Dependence on renal dialysis: Secondary | ICD-10-CM | POA: Diagnosis not present

## 2023-08-25 DIAGNOSIS — I132 Hypertensive heart and chronic kidney disease with heart failure and with stage 5 chronic kidney disease, or end stage renal disease: Secondary | ICD-10-CM | POA: Diagnosis not present

## 2023-08-25 LAB — RENAL FUNCTION PANEL
Albumin: 3.1 g/dL — ABNORMAL LOW (ref 3.5–5.0)
Anion gap: 12 (ref 5–15)
BUN: 113 mg/dL — ABNORMAL HIGH (ref 6–20)
CO2: 23 mmol/L (ref 22–32)
Calcium: 7.6 mg/dL — ABNORMAL LOW (ref 8.9–10.3)
Chloride: 103 mmol/L (ref 98–111)
Creatinine, Ser: 8.57 mg/dL — ABNORMAL HIGH (ref 0.44–1.00)
GFR, Estimated: 5 mL/min — ABNORMAL LOW (ref >60)
Glucose, Bld: 85 mg/dL (ref 70–99)
Phosphorus: 5.4 mg/dL — ABNORMAL HIGH (ref 2.5–4.6)
Potassium: 6.2 mmol/L — ABNORMAL HIGH (ref 3.5–5.1)
Sodium: 138 mmol/L (ref 135–145)

## 2023-08-25 LAB — CBC
HCT: 22.8 % — ABNORMAL LOW (ref 36.0–46.0)
Hemoglobin: 7.6 g/dL — ABNORMAL LOW (ref 12.0–15.0)
MCH: 28.3 pg (ref 26.0–34.0)
MCHC: 33.3 g/dL (ref 30.0–36.0)
MCV: 84.8 fL (ref 80.0–100.0)
Platelets: 245 10*3/uL (ref 150–400)
RBC: 2.69 MIL/uL — ABNORMAL LOW (ref 3.87–5.11)
RDW: 18.6 % — ABNORMAL HIGH (ref 11.5–15.5)
WBC: 8.8 10*3/uL (ref 4.0–10.5)
nRBC: 0 % (ref 0.0–0.2)

## 2023-08-25 MED ORDER — HEPARIN SODIUM (PORCINE) 1000 UNIT/ML IJ SOLN
INTRAMUSCULAR | Status: AC
  Filled 2023-08-25: qty 10

## 2023-08-25 NOTE — Progress Notes (Signed)
 Occupational Therapy Treatment Patient Details Name: Allison Hill MRN: [id number] DOB: 08-Jan-1971 Today's Date: 08/25/2023   History of present illness Pt is a 53 y.o. female with medical history significant of ESRD on HD MWF, refractory HTN, chronic HFpEF, chronic iron deficiency anemia presented with new onset of chest pain, workup for hypertensive emergency.   OT comments  Pt is supine in bed on arrival. Easily arousable and agreeable to OT session. She denies pain, but reports fatigue after dialysis session with AM. Pt performed bed mobility tasks MOD I level. PT joined the session and monitor BP 170/106 seated EOB. Pt required SUP for STS and CGA for in room mobility to the sink to perform grooming tasks. Wide BOS with mobility, cueing to stay closer to RW for better stability. IND to don/doff socks at bed level. Provided red theraband and edu on BUE strengthening exercises with pt demo back with SUP to maximize her overall strength/endurance. Pt took meds in long sitting in bed at end of session with nurse notified. Pt returned to bed with all needs in place and will cont to require skilled acute OT services to maximize her safety and IND to return to PLOF.       If plan is discharge home, recommend the following:  A little help with walking and/or transfers;A little help with bathing/dressing/bathroom;Assistance with cooking/housework;Assistance with feeding;Direct supervision/assist for medications management;Direct supervision/assist for financial management;Assist for transportation;Help with stairs or ramp for entrance;Supervision due to cognitive status   Equipment Recommendations  None recommended by OT    Recommendations for Other Services      Precautions / Restrictions Precautions Precautions: Fall Recall of Precautions/Restrictions: Impaired Restrictions Weight Bearing Restrictions Per Provider Order: No       Mobility Bed Mobility Overal bed mobility: Modified  Independent                  Transfers Overall transfer level: Needs assistance Equipment used: Rolling walker (2 wheels) Transfers: Sit to/from Stand Sit to Stand: Supervision           General transfer comment: SUP STS from EOB to RW and CGA for in room mobility to the door and back     Balance Overall balance assessment: Needs assistance Sitting-balance support: Feet supported Sitting balance-Leahy Scale: Good     Standing balance support: Bilateral upper extremity supported, Reliant on assistive device for balance, During functional activity Standing balance-Leahy Scale: Fair Standing balance comment: RW use and CGA to Min A with wide BOS                           ADL either performed or assessed with clinical judgement   ADL Overall ADL's : Needs assistance/impaired     Grooming: Wash/dry face;Standing;Contact guard assist Grooming Details (indicate cue type and reason): CGA standing at sink for face washing             Lower Body Dressing: Sitting/lateral leans;Modified independent Lower Body Dressing Details (indicate cue type and reason): to don/doff socks bed level                    Extremity/Trunk Assessment              Vision       Perception     Praxis     Communication Communication Communication: Impaired Factors Affecting Communication: Difficulty expressing self   Cognition Arousal: Alert Behavior During Therapy: [facility] for tasks assessed/performed  Cognition: Cognition impaired, No family/caregiver present to determine baseline             OT - Cognition Comments: suspect some level of cognitive involvement -  pt requires increased time for processing, slow, tired                 Following commands: Intact Following commands impaired: Follows multi-step commands inconsistently      Cueing   Cueing Techniques: Verbal cues  Exercises Other Exercises Other Exercises: Provided red theraband and  educated on 2 BUE strengthening exercises at bed level with pt demo back x10 reps of each.    Shoulder Instructions       General Comments elevated BP seated EOB, took meds at end of session    Pertinent Vitals/ Pain       Pain Assessment Pain Assessment: No/denies pain  Home Living                                          Prior Functioning/Environment              Frequency  Min 2X/week        Progress Toward Goals  OT Goals(current goals can now be found in the care plan section)  Progress towards OT goals: Progressing toward goals  Acute Rehab OT Goals OT Goal Formulation: With patient Time For Goal Achievement: 09/08/23 Potential to Achieve Goals: Fair  Plan      Co-evaluation    PT/OT/SLP Co-Evaluation/Treatment: Yes Reason for Co-Treatment: For patient/therapist safety PT goals addressed during session: Mobility/safety with mobility OT goals addressed during session: ADL's and self-care      AM-PAC OT "6 Clicks" Daily Activity     Outcome Measure   Help from another person eating meals?: None Help from another person taking care of personal grooming?: None Help from another person toileting, which includes using toliet, bedpan, or urinal?: A Little Help from another person bathing (including washing, rinsing, drying)?: A Little Help from another person to put on and taking off regular upper body clothing?: A Little Help from another person to put on and taking off regular lower body clothing?: A Little 6 Click Score: 20    End of Session Equipment Utilized During Treatment: Rolling walker (2 wheels)  OT Visit Diagnosis: Unsteadiness on feet (R26.81);Other abnormalities of gait and mobility (R26.89);Muscle weakness (generalized) (M62.81)   Activity Tolerance Patient tolerated treatment well   Patient Left in bed;with call bell/phone within reach;with bed alarm set   Nurse Communication Mobility status        Time:  1324-4010 OT Time Calculation (min): 26 min  Charges: OT General Charges $OT Visit: 1 Visit OT Treatments $Self Care/Home Management : 8-22 mins  Amal Renbarger, OTR/L  08/25/23, 3:02 PM   Sihaam Chrobak E Griselle Rufer 08/25/2023, 2:59 PM

## 2023-08-25 NOTE — Progress Notes (Signed)
 Hemodialysis Note:  Received patient in bed to unit. Alert and oriented. Informed consent singed and in chart.  Treatment initiated: 0813 Treatment completed: 1200  Access used: Right subclavian catheter Access issues: None  Patient tolerated well. Transported back to room, alert without acute distress. Report given to patient's RN.  Total UF removed: 2.4 Liters Medications given: Epogen 10000 units IV  Post HD weight: 63.3 Kg  [facility]

## 2023-08-25 NOTE — Progress Notes (Signed)
 Progress Note   Patient: Allison Hill ZOX:[id number] DOB: 23-Jun-1970 DOA: 08/09/2023     0 DOS: the patient was seen and examined on 08/25/2023   Brief hospital course: Verma Grothaus is a 53 y.o. female with medical history significant of ESRD on HD MWF, refractory HTN, chronic HFpEF, chronic iron deficiency anemia admitted to hospitalist service for evaluation of chest pain, hypertensive urgency.  Patient's chest pain atypical, troponins negative.  Blood pressure improved with home regimen.  Patient lives with her daughter who does not want to take her home given high risk for falls, not safe for her HD transportation.  HD outpatient facility arranged.  TOC working on placement   On 08/13/2023/08/14/2023 patient is complaining of severe pain in her right ear that is not controlled by tylenol. The pain has not improved with IV Unasyn. CT head demonstrated mastoiditis and otitis media with swelling of the external canal. The patient was given IV Fentanyl for pain and antibiotics were expanded to include cipro PO. She will undergo dialysis on Saturday as part of her usual schedule.  PT/OT recommended nursing home placement, however insurance company denied the application.   Principal Problem:   Chest pain Active Problems:   Otitis media not resolved, right   Hypertensive urgency   ESRD on dialysis (HCC)   Right upper quadrant abdominal pain   Assessment and Plan: Otitis media not resolved, right Severe. Cipro added for broader coverage as the patient has not improved with unasyn alone. Fentanyl for pain control. Blood cultures x 2 drawn. Cultures of the yellow fluid coming out of external canal is cultured. It has grown strep pneumoniae species that appears to be pan sensitive.  She was initially treated with Unasyn, then changed to Cipro 4 days ago.  Antibiotics course completed, symptom resolved.   Hypertensive urgency Orthostatic hypotension. Patient blood pressure running low, also  feel dizzy, discontinued amlodipine.  Continue monitor blood pressure.   ESRD on dialysis [facility]) Hyperkalemia hypocalcemia Scheduled dialysis.   Chest pain Resolved. Due to elevated pressures/musculoskeletal issues.    Right upper quadrant abdominal pain Although the patient continues to complain of this discomfort, abdominal exam does not reflect this. US of the RUQ of the abdomen is negative for cholecystitis or cholelithiasis.  Added Protonix.  Condition improved.    No change in treatment plan, currently patient has no place to go.  TOC is working up a plan.    Subjective:  She has no complaint today.  Physical Exam: Vitals:   08/25/23 1100 08/25/23 1130 08/25/23 1200 08/25/23 1348  BP: (!) 168/99 (!) 174/105 (!) 190/117 (!) 174/117  Pulse: 84 88 84 92  Resp:      Temp:    99.5 F (37.5 C)  TempSrc:    Oral  SpO2: 100% 100% 100% 100%  Weight:      Height:       General exam: Appears calm and comfortable  Respiratory system: Clear to auscultation. Respiratory effort normal. Cardiovascular system: S1 & S2 heard, RRR. No JVD, murmurs, rubs, gallops or clicks. No pedal edema. Gastrointestinal system: Abdomen is nondistended, soft and nontender. No organomegaly or masses felt. Normal bowel sounds heard. Central nervous system: Alert and oriented. No focal neurological deficits. Extremities: Symmetric 5 x 5 power. Skin: No rashes, lesions or ulcers Psychiatry: Judgement and insight appear normal. Mood & affect appropriate.    Data Reviewed:  There are no new results to review at this time.  Family Communication: None  Disposition: Status is:  Observation      Time spent: 25 minutes  Author: Marrion Coy, MD 08/25/2023 3:21 PM  For on call review [email or web address].

## 2023-08-25 NOTE — Progress Notes (Signed)
 [facility]  ROUNDING NOTE   Subjective:   Patient seen and evaluated during dialysis   HEMODIALYSIS FLOWSHEET:  Blood Flow Rate (mL/min): 349 mL/min Arterial Pressure (mmHg): -154.13 mmHg Venous Pressure (mmHg): 276.55 mmHg TMP (mmHg): 19.8 mmHg Ultrafiltration Rate (mL/min): 971 mL/min Dialysate Flow Rate (mL/min): 299 ml/min Dialysis Fluid Bolus: Normal Saline  No complaints to offer  Objective:  Vital signs in last 24 hours:  Temp:  [98 F (36.7 C)-98.7 F (37.1 C)] 98 F (36.7 C) (03/25 0800) Pulse Rate:  [80-95] 88 (03/25 1130) Resp:  [12-26] 16 (03/25 1000) BP: (126-186)/(76-149) 174/105 (03/25 1130) SpO2:  [98 %-100 %] 100 % (03/25 1130) Weight:  [65.7 kg-67.5 kg] 65.7 kg (03/25 0800)  Weight change:  Filed Weights   08/23/23 0512 08/25/23 0433 08/25/23 0800  Weight: 66.7 kg 67.5 kg 65.7 kg    Intake/Output: I/O last 3 completed shifts: In: 480 [P.O.:480] Out: -    Intake/Output this shift:  No intake/output data recorded.  Physical Exam: General: NAD  Head: Normocephalic, atraumatic. Moist oral mucosal membranes  Eyes: Anicteric  Lungs:  Clear to auscultation  Heart: Regular rate and rhythm  Abdomen:  Soft, nontender  Extremities: No peripheral edema.  Neurologic: Alert, moving all four extremities  Skin: No lesions  Access: Rt Chest Permcath    Basic Metabolic Panel: Recent Labs  Lab 08/19/23 0334 08/20/23 0808 08/22/23 0804 08/25/23 0811  NA 134* 134* 133* 138  K 4.3 5.4* 5.2* 6.2*  CL 94* 98 97* 103  CO2 23 26 26 23   GLUCOSE 123* 69* 73 85  BUN 78* 108* 95* 113*  CREATININE 5.03* 7.19* 6.88* 8.57*  CALCIUM 7.6* 6.4* 7.4* 7.6*  PHOS 5.5* 6.1* 4.6 5.4*    Liver Function Tests: Recent Labs  Lab 08/19/23 0334 08/20/23 0808 08/22/23 0804 08/25/23 0811  ALBUMIN 3.9 3.3* 3.1* 3.1*    No results for input(s): "LIPASE", "AMYLASE" in the last 168 hours. No results for input(s): "AMMONIA" in the last 168  hours.  CBC: Recent Labs  Lab 08/20/23 0808 08/22/23 0804 08/25/23 0811  WBC 8.4 9.5 8.8  HGB 8.4* 7.7* 7.6*  HCT 25.4* 23.6* 22.8*  MCV 83.8 84.0 84.8  PLT 260 254 245    Cardiac Enzymes: No results for input(s): "CKTOTAL", "CKMB", "CKMBINDEX", "TROPONINI" in the last 168 hours.  BNP: Invalid input(s): "POCBNP"  CBG: No results for input(s): "GLUCAP" in the last 168 hours.  Microbiology: Results for orders placed or performed during the hospital encounter of 08/09/23  Resp panel by RT-PCR (RSV, Flu A&B, Covid) Anterior Nasal Swab     Status: None   Collection Time: 08/14/23  3:44 AM   Specimen: Anterior Nasal Swab  Result Value Ref Range Status   SARS Coronavirus 2 by RT PCR NEGATIVE NEGATIVE Final    Comment: (NOTE) SARS-CoV-2 target nucleic acids are NOT DETECTED.  The SARS-CoV-2 RNA is generally detectable in upper respiratory specimens during the acute phase of infection. The lowest concentration of SARS-CoV-2 viral copies this assay can detect is 138 copies/mL. A negative result does not preclude SARS-Cov-2 infection and should not be used as the sole basis for treatment or other patient management decisions. A negative result may occur with  improper specimen collection/handling, submission of specimen other than nasopharyngeal swab, presence of viral mutation(s) within the areas targeted by this assay, and inadequate number of viral copies(<138 copies/mL). A negative result must be combined with clinical observations, patient history, and epidemiological information. The expected result  is Negative.  Fact Sheet for Patients:  BloggerCourse.com  Fact Sheet for Healthcare Providers:  SeriousBroker.it  This test is no t yet approved or cleared by the [place] FDA and  has been authorized for detection and/or diagnosis of SARS-CoV-2 by FDA under an Emergency Use Authorization (EUA). This EUA will remain   in effect (meaning this test can be used) for the duration of the COVID-19 declaration under Section 564(b)(1) of the Act, 21 U.S.C.section 360bbb-3(b)(1), unless the authorization is terminated  or revoked sooner.       Influenza A by PCR NEGATIVE NEGATIVE Final   Influenza B by PCR NEGATIVE NEGATIVE Final    Comment: (NOTE) The Xpert Xpress SARS-CoV-2/FLU/RSV plus assay is intended as an aid in the diagnosis of influenza from Nasopharyngeal swab specimens and should not be used as a sole basis for treatment. Nasal washings and aspirates are unacceptable for Xpert Xpress SARS-CoV-2/FLU/RSV testing.  Fact Sheet for Patients: BloggerCourse.com  Fact Sheet for Healthcare Providers: SeriousBroker.it  This test is not yet approved or cleared by the [place] FDA and has been authorized for detection and/or diagnosis of SARS-CoV-2 by FDA under an Emergency Use Authorization (EUA). This EUA will remain in effect (meaning this test can be used) for the duration of the COVID-19 declaration under Section 564(b)(1) of the Act, 21 U.S.C. section 360bbb-3(b)(1), unless the authorization is terminated or revoked.     Resp Syncytial Virus by PCR NEGATIVE NEGATIVE Final    Comment: (NOTE) Fact Sheet for Patients: BloggerCourse.com  Fact Sheet for Healthcare Providers: SeriousBroker.it  This test is not yet approved or cleared by the [place] FDA and has been authorized for detection and/or diagnosis of SARS-CoV-2 by FDA under an Emergency Use Authorization (EUA). This EUA will remain in effect (meaning this test can be used) for the duration of the COVID-19 declaration under Section 564(b)(1) of the Act, 21 U.S.C. section 360bbb-3(b)(1), unless the authorization is terminated or revoked.  Performed at [facility], [address] [place], [place] [address]    Respiratory (~20 pathogens) panel by PCR     Status: None   Collection Time: 08/14/23  3:44 AM   Specimen: Nasopharyngeal Swab; Respiratory  Result Value Ref Range Status   Adenovirus NOT DETECTED NOT DETECTED Final   Coronavirus 229E NOT DETECTED NOT DETECTED Final    Comment: (NOTE) The Coronavirus on the Respiratory Panel, DOES NOT test for the novel  Coronavirus (2019 nCoV)    Coronavirus HKU1 NOT DETECTED NOT DETECTED Final   Coronavirus NL63 NOT DETECTED NOT DETECTED Final   Coronavirus OC43 NOT DETECTED NOT DETECTED Final   Metapneumovirus NOT DETECTED NOT DETECTED Final   Rhinovirus / Enterovirus NOT DETECTED NOT DETECTED Final   Influenza A NOT DETECTED NOT DETECTED Final   Influenza B NOT DETECTED NOT DETECTED Final   Parainfluenza Virus 1 NOT DETECTED NOT DETECTED Final   Parainfluenza Virus 2 NOT DETECTED NOT DETECTED Final   Parainfluenza Virus 3 NOT DETECTED NOT DETECTED Final   Parainfluenza Virus 4 NOT DETECTED NOT DETECTED Final   Respiratory Syncytial Virus NOT DETECTED NOT DETECTED Final   Bordetella pertussis NOT DETECTED NOT DETECTED Final   Bordetella Parapertussis NOT DETECTED NOT DETECTED Final   Chlamydophila pneumoniae NOT DETECTED NOT DETECTED Final   Mycoplasma pneumoniae NOT DETECTED NOT DETECTED Final    Comment: Performed at [facility], [address] [place], [place] [address]  Culture, blood (Routine X 2) w Reflex to ID  Panel     Status: None   Collection Time: 08/14/23  9:19 AM   Specimen: BLOOD  Result Value Ref Range Status   Specimen Description BLOOD BLOOD RIGHT HAND  Final   Special Requests   Final    BOTTLES DRAWN AEROBIC AND ANAEROBIC Blood Culture adequate volume   Culture   Final    NO GROWTH 5 DAYS Performed at [facility], [address] [place], [place] [address]    Report Status 08/19/2023 FINAL  Final  Culture, blood (Routine X 2) w Reflex to ID Panel     Status: None   Collection Time: 08/14/23  9:19 AM    Specimen: BLOOD  Result Value Ref Range Status   Specimen Description BLOOD BLOOD LEFT HAND  Final   Special Requests   Final    BOTTLES DRAWN AEROBIC AND ANAEROBIC Blood Culture adequate volume   Culture   Final    NO GROWTH 5 DAYS Performed at [facility], [address] [place], [place] [address]    Report Status 08/19/2023 FINAL  Final  Ear culture     Status: None   Collection Time: 08/14/23 10:11 AM   Specimen: Ear; Other  Result Value Ref Range Status   Specimen Description   Final    EAR Performed at [facility], [address] [place], [place] [address]    Special Requests   Final    RIGHT EAR Performed at [facility], [address] [place], [place] [address]    Culture RARE STREPTOCOCCUS PNEUMONIAE  Final   Report Status 08/16/2023 FINAL  Final   Organism ID, Bacteria STREPTOCOCCUS PNEUMONIAE  Final      Susceptibility   Streptococcus pneumoniae - MIC*    ERYTHROMYCIN <=0.12 SENSITIVE Sensitive     LEVOFLOXACIN 1 SENSITIVE Sensitive     VANCOMYCIN 0.5 SENSITIVE Sensitive     PENICILLIN (meningitis) <=0.06 SENSITIVE Sensitive     PENO - penicillin <=0.06      PENICILLIN (non-meningitis) <=0.06 SENSITIVE Sensitive     PENICILLIN (oral) <=0.06 SENSITIVE Sensitive     CEFTRIAXONE (non-meningitis) <=0.12 SENSITIVE Sensitive     CEFTRIAXONE (meningitis) <=0.12 SENSITIVE Sensitive     * RARE STREPTOCOCCUS PNEUMONIAE    Coagulation Studies: No results for input(s): "LABPROT", "INR" in the last 72 hours.  Urinalysis: No results for input(s): "COLORURINE", "LABSPEC", "PHURINE", "GLUCOSEU", "HGBUR", "BILIRUBINUR", "KETONESUR", "PROTEINUR", "UROBILINOGEN", "NITRITE", "LEUKOCYTESUR" in the last 72 hours.  Invalid input(s): "APPERANCEUR"    Imaging: No results found.    Medications:     acetaminophen  1,000 mg Oral Q6H   aspirin EC  81 mg Oral Daily   atorvastatin  40 mg Oral Daily   calcium carbonate  400 mg of elemental  calcium Oral BID   Chlorhexidine Gluconate Cloth  6 each Topical Q0600   cinacalcet  30 mg Oral Q supper   epoetin alfa  10,000 Units Intravenous Q T,Th,Sa-HD   fluticasone  1 spray Each Nare Daily   gabapentin  200 mg Oral BID   heparin  5,000 Units Subcutaneous Q12H   hydrALAZINE  100 mg Oral TID   hydrOXYzine  25 mg Oral TID   isosorbide mononitrate  120 mg Oral Daily   labetalol  600 mg Oral BID   losartan  100 mg Oral Daily   pantoprazole  40 mg Oral Daily   senna-docusate  2 tablet Oral BID   sevelamer carbonate  1,600 mg Oral TID with meals  terazosin  2 mg Oral BID   guaiFENesin-dextromethorphan, ondansetron (ZOFRAN) IV, oxyCODONE  Assessment/ Plan:  Allison Hill is a 53 y.o.  female  with past medical conditions including hypertension, CHF, anemia, and end-stage renal disease on hemodialysis, who was admitted to [facility] on 08/09/2023 for Hypertensive urgency [I16.0] Chest pain [R07.9] Nonspecific chest pain [R07.9] Anemia due to chronic kidney disease, on chronic dialysis (HCC) [N18.6, D63.1, Z99.2]  Outpatient dialysis clinic has been arranged at [facility] on a TTS schedule, chair time at 530 am.     1.  End stage renal disease on hemodialysis.  Patient receiving dialysis today, UF goal 2-2.5 l as tolerated.  Next treatment scheduled for Thursday.  Monitoring discharge plan.    2. Anemia of chronic kidney disease Hemoglobin & Hematocrit     Component Value Date/Time   HGB 7.6 (L) 08/25/2023 0811   HCT 22.8 (L) 08/25/2023 1610   Continue EPO with  dialysis treatment.   3. Hypertensive urgency, on admission.  Patient with reasonable blood pressure control now.  Continue amlodipine, hydralazine, labetalol, losartan, and terazosin  Blood pressure 168/99 during dialysis   4. Secondary Hyperparathyroidism: with outpatient labs: None available Calcium improved from 7.6. Slow correction with dialysis and increased appetite.     LOS: 0 Quinto Tippy 3/25/202512:22 PM

## 2023-08-25 NOTE — Plan of Care (Signed)
  Problem: Activity: Goal: Ability to tolerate increased activity will improve Outcome: Progressing   Problem: Cardiac: Goal: Ability to achieve and maintain adequate cardiovascular perfusion will improve Outcome: Progressing   Problem: Health Behavior/Discharge Planning: Goal: Ability to safely manage health-related needs after discharge will improve Outcome: Progressing   

## 2023-08-25 NOTE — Progress Notes (Signed)
 Physical Therapy Treatment Patient Details Name: Allison Hill MRN: [id number] DOB: 1971/03/28 Today's Date: 08/25/2023   History of Present Illness Pt is a 53 y.o. female with medical history significant of ESRD on HD MWF, refractory HTN, chronic HFpEF, chronic iron deficiency anemia presented with new onset of chest pain, workup for hypertensive emergency.    PT Comments  Patient seated EOB with OT upon PT arrival, co-treat due to pt tolerance/endurance s/p HD today, as well as elevated BP (171/106, RN notified). Sit <> stand with supervision with verbal/tactile cues for hand placement, RW placement, and foot placement with poor carryover. Pt self selected ambulation distance of 54ft, with RW, CGA-minA. 1 LOB noted, minA to correct. Pt also educated on bed level therapeutic exercises, x10 each BLE with verbal cues for form. Pt with OT at bedside at end of session. The patient would benefit from further skilled PT intervention to continue to progress towards goals.    If plan is discharge home, recommend the following: A little help with walking and/or transfers;A little help with bathing/dressing/bathroom;Assistance with cooking/housework;Assist for transportation;Help with stairs or ramp for entrance   Can travel by private vehicle     Yes  Equipment Recommendations  None recommended by PT    Recommendations for Other Services       Precautions / Restrictions Precautions Precautions: Fall Recall of Precautions/Restrictions: Impaired Restrictions Weight Bearing Restrictions Per Provider Order: No     Mobility  Bed Mobility Overal bed mobility: Modified Independent                  Transfers Overall transfer level: Needs assistance Equipment used: Rolling walker (2 wheels) Transfers: Sit to/from Stand Sit to Stand: Supervision           General transfer comment: cued for technique, hand positioning, foot positioning (wide BOS noted)     Ambulation/Gait Ambulation/Gait assistance: Contact guard assist, Min assist Gait Distance (Feet): 20 Feet Assistive device: Rolling walker (2 wheels)   Gait velocity: decreased     General Gait Details: 1 LOB with minA to correct with ambulation returning to bed   Stairs             Wheelchair Mobility     Tilt Bed    Modified Rankin (Stroke Patients Only)       Balance Overall balance assessment: Needs assistance Sitting-balance support: Feet supported Sitting balance-Leahy Scale: Good     Standing balance support: Bilateral upper extremity supported, Reliant on assistive device for balance, During functional activity Standing balance-Leahy Scale: Fair Standing balance comment: RW use and CGA to Min A with wide BOS                            Communication Communication Communication: Impaired Factors Affecting Communication: Difficulty expressing self  Cognition Arousal: Alert Behavior During Therapy: WFL for tasks assessed/performed   PT - Cognitive impairments: No family/caregiver present to determine baseline                         Following commands: Intact Following commands impaired: Follows multi-step commands inconsistently    Cueing Cueing Techniques: Verbal cues, Tactile cues  Exercises General Exercises - Lower Extremity Ankle Circles/Pumps: AROM, Both, 10 reps Gluteal Sets: AROM, Both, 10 reps Heel Slides: AROM, Both, 10 reps Hip ABduction/ADduction: AROM, Both, 10 reps    General Comments General comments (skin integrity, edema, etc.): elevated BP seated EOB,  took meds at end of session      Pertinent Vitals/Pain Pain Assessment Pain Assessment: No/denies pain    Home Living                          Prior Function            PT Goals (current goals can now be found in the care plan section)      Frequency    Min 1X/week      PT Plan      Co-evaluation PT/OT/SLP  Co-Evaluation/Treatment: Yes Reason for Co-Treatment: For patient/therapist safety PT goals addressed during session: Mobility/safety with mobility OT goals addressed during session: ADL's and self-care      AM-PAC PT "6 Clicks" Mobility   Outcome Measure  Help needed turning from your back to your side while in a flat bed without using bedrails?: None Help needed moving from lying on your back to sitting on the side of a flat bed without using bedrails?: A Little Help needed moving to and from a bed to a chair (including a wheelchair)?: A Little Help needed standing up from a chair using your arms (e.g., wheelchair or bedside chair)?: A Little Help needed to walk in hospital room?: A Little Help needed climbing 3-5 steps with a railing? : A Little 6 Click Score: 19    End of Session   Activity Tolerance: Patient tolerated treatment well Patient left: in bed;with call bell/phone within reach;Other (comment) (with OT at bedside) Nurse Communication: Mobility status PT Visit Diagnosis: Other abnormalities of gait and mobility (R26.89);Difficulty in walking, not elsewhere classified (R26.2);Muscle weakness (generalized) (M62.81)     Time: 9147-8295 PT Time Calculation (min) (ACUTE ONLY): 18 min  Charges:    $Therapeutic Activity: 8-22 mins PT General Charges $$ ACUTE PT VISIT: 1 Visit                     Olga Coaster PT, DPT 3:13 PM,08/25/23

## 2023-08-25 NOTE — Plan of Care (Signed)

## 2023-08-26 DIAGNOSIS — R0789 Other chest pain: Secondary | ICD-10-CM | POA: Diagnosis not present

## 2023-08-26 DIAGNOSIS — Z992 Dependence on renal dialysis: Secondary | ICD-10-CM | POA: Diagnosis not present

## 2023-08-26 DIAGNOSIS — I132 Hypertensive heart and chronic kidney disease with heart failure and with stage 5 chronic kidney disease, or end stage renal disease: Secondary | ICD-10-CM | POA: Diagnosis not present

## 2023-08-26 DIAGNOSIS — N186 End stage renal disease: Secondary | ICD-10-CM | POA: Diagnosis not present

## 2023-08-26 NOTE — Plan of Care (Signed)

## 2023-08-26 NOTE — Progress Notes (Signed)
 Progress Note    Allison Hill  ZOX:[id number] DOB: 19-Apr-1971  DOA: 08/09/2023 PCP: Melvenia Beam, MD      Brief Narrative:    Medical records reviewed and are as summarized below:  Allison Hill is a 53 y.o. female with medical history significant of ESRD on HD MWF, refractory HTN, chronic HFpEF, chronic iron deficiency anemia admitted to hospitalist service for evaluation of chest pain, hypertensive urgency.  Patient's chest pain atypical, troponins negative.  Blood pressure improved with home regimen.  Patient lives with her daughter who does not want to take her home given high risk for falls, not safe for her HD transportation.  HD outpatient facility arranged.  TOC working on placement   On 08/13/2023/08/14/2023 patient is complaining of severe pain in her right ear that is not controlled by tylenol. The pain has not improved with IV Unasyn. CT head demonstrated mastoiditis and otitis media with swelling of the external canal. The patient was given IV Fentanyl for pain and antibiotics were expanded to include cipro PO. She will undergo dialysis on Saturday as part of her usual schedule.  PT/OT recommended nursing home placement, however insurance company denied the application.      Assessment/Plan:   Principal Problem:   Chest pain Active Problems:   Otitis media not resolved, right   Hypertensive urgency   ESRD on dialysis (HCC)   Right upper quadrant abdominal pain    Body mass index is 25.66 kg/m.   Right otitis media Resolved. Completed 7 days of antibiotics on 08/20/2023 (including IV Unasyn followed by ciprofloxacin and Levaquin).    Hypertensive urgency Orthostatic hypotension. BP stable Continue labetalol, losartan, terazosin and hydralazine. Amlodipine was discontinued because BP was running low.    ESRD on dialysis (HCC) Hyperkalemia Hypocalcemia Anemia of chronic kidney disease Continue Epogen, Cinacalcet, sevelamer and  calcium carbonate Follow-up with nephrologist for hemodialysis    Chest pain Resolved    Right upper quadrant abdominal pain Improved.  Continue Protonix US of the RUQ of the abdomen is negative for cholecystitis or cholelithiasis.      General weakness No safe discharge plan.  Awaiting placement to long-term care facility Follow-up with transition of care team to assist with disposition    Diet Order             Diet Heart Room service appropriate? Yes; Fluid consistency: Thin  Diet effective now                            Consultants: Nephrologist  Procedures: None    Medications:    acetaminophen  1,000 mg Oral Q6H   aspirin EC  81 mg Oral Daily   atorvastatin  40 mg Oral Daily   calcium carbonate  400 mg of elemental calcium Oral BID   Chlorhexidine Gluconate Cloth  6 each Topical Q0600   cinacalcet  30 mg Oral Q supper   epoetin alfa  10,000 Units Intravenous Q T,Th,Sa-HD   fluticasone  1 spray Each Nare Daily   gabapentin  200 mg Oral BID   heparin  5,000 Units Subcutaneous Q12H   hydrALAZINE  100 mg Oral TID   hydrOXYzine  25 mg Oral TID   isosorbide mononitrate  120 mg Oral Daily   labetalol  600 mg Oral BID   losartan  100 mg Oral Daily   pantoprazole  40 mg Oral Daily   senna-docusate  2 tablet Oral BID  sevelamer carbonate  1,600 mg Oral TID with meals   terazosin  2 mg Oral BID   Continuous Infusions:   Anti-infectives (From admission, onward)    Start     Dose/Rate Route Frequency Ordered Stop   08/19/23 1800  levofloxacin (LEVAQUIN) tablet 250 mg        250 mg Oral Every evening 08/19/23 1504 08/20/23 1739   08/15/23 1800  ciprofloxacin (CIPRO) tablet 500 mg  Status:  Discontinued        500 mg Oral Every evening 08/14/23 0856 08/19/23 1502   08/14/23 0915  ciprofloxacin (CIPRO) tablet 500 mg        500 mg Oral  Once 08/14/23 0856 08/14/23 1001   08/14/23 0615  Ampicillin-Sulbactam (UNASYN) 3 g in sodium chloride 0.9 %  100 mL IVPB  Status:  Discontinued        3 g 200 mL/hr over 30 Minutes Intravenous Every 12 hours 08/14/23 0528 08/16/23 1427              Family Communication/Anticipated D/C date and plan/Code Status   DVT prophylaxis: heparin injection 5,000 Units Start: 08/09/23 1000     Code Status: Full Code  Family Communication: None Disposition Plan: Plan to discharge to SNF   Status is: Observation The patient will require care spanning > 2 midnights and should be moved to inpatient because: No safe discharge plan       Subjective:   Interval events noted.  She comes times of dizziness.  No other complaints.  She said she cannot go to her daughter's home.  She is trying to figure out where to go from the hospital.  Objective:    Vitals:   08/25/23 2025 08/25/23 2253 08/26/23 0430 08/26/23 0734  BP: 127/80 (!) 153/96 (!) 163/98 (!) 151/97  Pulse: 91 80 75 84  Resp: 17 20 20 19   Temp: 98.8 F (37.1 C) 98.6 F (37 C) 97.9 F (36.6 C) 98.5 F (36.9 C)  TempSrc:  Oral Oral Oral  SpO2: 94% 98% 97% 100%  Weight:      Height:       No data found.   Intake/Output Summary (Last 24 hours) at 08/26/2023 0943 Last data filed at 08/25/2023 1500 Gross per 24 hour  Intake 0 ml  Output 2400 ml  Net -2400 ml   Filed Weights   08/23/23 0512 08/25/23 0433 08/25/23 0800  Weight: 66.7 kg 67.5 kg 65.7 kg    Exam:  GEN: NAD SKIN: Warm and dry EYES: No pallor or icterus ENT: MMM CV: RRR PULM: CTA B ABD: soft, ND, NT, +BS CNS: AAO x 3, non focal EXT: No edema or tenderness        Data Reviewed:   I have personally reviewed following labs and imaging studies:  Labs: Labs show the following:   Basic Metabolic Panel: Recent Labs  Lab 08/20/23 0808 08/22/23 0804 08/25/23 0811  NA 134* 133* 138  K 5.4* 5.2* 6.2*  CL 98 97* 103  CO2 26 26 23   GLUCOSE 69* 73 85  BUN 108* 95* 113*  CREATININE 7.19* 6.88* 8.57*  CALCIUM 6.4* 7.4* 7.6*  PHOS 6.1* 4.6  5.4*   GFR Estimated Creatinine Clearance: 7 mL/min (A) (by C-G formula based on SCr of 8.57 mg/dL (H)). Liver Function Tests: Recent Labs  Lab 08/20/23 0808 08/22/23 0804 08/25/23 0811  ALBUMIN 3.3* 3.1* 3.1*   No results for input(s): "LIPASE", "AMYLASE" in the last 168 hours. No results  for input(s): "AMMONIA" in the last 168 hours. Coagulation profile No results for input(s): "INR", "PROTIME" in the last 168 hours.  CBC: Recent Labs  Lab 08/20/23 0808 08/22/23 0804 08/25/23 0811  WBC 8.4 9.5 8.8  HGB 8.4* 7.7* 7.6*  HCT 25.4* 23.6* 22.8*  MCV 83.8 84.0 84.8  PLT 260 254 245   Cardiac Enzymes: No results for input(s): "CKTOTAL", "CKMB", "CKMBINDEX", "TROPONINI" in the last 168 hours. BNP (last 3 results) No results for input(s): "PROBNP" in the last 8760 hours. CBG: No results for input(s): "GLUCAP" in the last 168 hours. D-Dimer: No results for input(s): "DDIMER" in the last 72 hours. Hgb A1c: No results for input(s): "HGBA1C" in the last 72 hours. Lipid Profile: No results for input(s): "CHOL", "HDL", "LDLCALC", "TRIG", "CHOLHDL", "LDLDIRECT" in the last 72 hours. Thyroid function studies: No results for input(s): "TSH", "T4TOTAL", "T3FREE", "THYROIDAB" in the last 72 hours.  Invalid input(s): "FREET3" Anemia work up: No results for input(s): "VITAMINB12", "FOLATE", "FERRITIN", "TIBC", "IRON", "RETICCTPCT" in the last 72 hours. Sepsis Labs: Recent Labs  Lab 08/20/23 0808 08/22/23 0804 08/25/23 0811  WBC 8.4 9.5 8.8    Microbiology No results found for this or any previous visit (from the past 240 hours).  Procedures and diagnostic studies:  No results found.             LOS: 0 days   Allison Hill  [facility]   Pager on [email or web address]. If 7PM-7AM, please contact night-coverage at [email or web address]     08/26/2023, 9:43 AM

## 2023-08-26 NOTE — TOC Progression Note (Signed)
 Transition of Care [facility]) - Progression Note    Patient Details  Name: Allison Hill MRN: [id number] Date of Birth: Oct 01, 1970  Transition of [facility]) CM/SW Contact  Margarito Liner, LCSW Phone Number: 08/26/2023, 3:21 PM  Clinical Narrative:   Expanded SNF search.  Expected Discharge Plan:  (TBD) Barriers to Discharge: Continued Medical Work up  Expected Discharge Plan and Services     Post Acute Care Choice:  (TBD) Living arrangements for the past 2 months: Single Family Home                                       Social Determinants of Health (SDOH) Interventions SDOH Screenings   Food Insecurity: No Food Insecurity (08/10/2023)  Recent Concern: Food Insecurity - High Risk (07/17/2023)   Received from [facility]  Housing: Low Risk  (08/10/2023)  Recent Concern: Housing - High Risk (07/22/2023)   Received from [facility]  Transportation Needs: Unmet Transportation Needs (08/10/2023)  Utilities: Not At Risk (08/10/2023)  Recent Concern: Utilities - Medium Risk (07/17/2023)   Received from [facility]  Financial Resource Strain: Patient Declined (01/03/2023)   Received from [facility]  Social Connections: Unknown (08/10/2023)  Tobacco Use: High Risk (08/10/2023)    Readmission Risk Interventions     No data to display

## 2023-08-26 NOTE — Progress Notes (Signed)
 [facility]  ROUNDING NOTE   Subjective:   Patient seen sitting at bedside No complaints to offer  Discharge planning in progress  Objective:  Vital signs in last 24 hours:  Temp:  [97.9 F (36.6 C)-99.5 F (37.5 C)] 98.3 F (36.8 C) (03/26 1135) Pulse Rate:  [75-94] 82 (03/26 1135) Resp:  [16-20] 18 (03/26 1135) BP: (113-174)/(74-117) 113/74 (03/26 1135) SpO2:  [94 %-100 %] 99 % (03/26 1135)  Weight change: -1.84 kg Filed Weights   08/23/23 0512 08/25/23 0433 08/25/23 0800  Weight: 66.7 kg 67.5 kg 65.7 kg    Intake/Output: I/O last 3 completed shifts: In: 120 [P.O.:120] Out: 2400 [Other:2400]   Intake/Output this shift:  No intake/output data recorded.  Physical Exam: General: NAD  Head: Normocephalic, atraumatic. Moist oral mucosal membranes  Eyes: Anicteric  Lungs:  Clear to auscultation  Heart: Regular rate and rhythm  Abdomen:  Soft, nontender  Extremities: No peripheral edema.  Neurologic: Alert, moving all four extremities  Skin: No lesions  Access: Rt Chest Permcath    Basic Metabolic Panel: Recent Labs  Lab 08/20/23 0808 08/22/23 0804 08/25/23 0811  NA 134* 133* 138  K 5.4* 5.2* 6.2*  CL 98 97* 103  CO2 26 26 23   GLUCOSE 69* 73 85  BUN 108* 95* 113*  CREATININE 7.19* 6.88* 8.57*  CALCIUM 6.4* 7.4* 7.6*  PHOS 6.1* 4.6 5.4*    Liver Function Tests: Recent Labs  Lab 08/20/23 0808 08/22/23 0804 08/25/23 0811  ALBUMIN 3.3* 3.1* 3.1*    No results for input(s): "LIPASE", "AMYLASE" in the last 168 hours. No results for input(s): "AMMONIA" in the last 168 hours.  CBC: Recent Labs  Lab 08/20/23 0808 08/22/23 0804 08/25/23 0811  WBC 8.4 9.5 8.8  HGB 8.4* 7.7* 7.6*  HCT 25.4* 23.6* 22.8*  MCV 83.8 84.0 84.8  PLT 260 254 245    Cardiac Enzymes: No results for input(s): "CKTOTAL", "CKMB", "CKMBINDEX", "TROPONINI" in the last 168 hours.  BNP: Invalid input(s): "POCBNP"  CBG: No results for input(s): "GLUCAP" in  the last 168 hours.  Microbiology: Results for orders placed or performed during the hospital encounter of 08/09/23  Resp panel by RT-PCR (RSV, Flu A&B, Covid) Anterior Nasal Swab     Status: None   Collection Time: 08/14/23  3:44 AM   Specimen: Anterior Nasal Swab  Result Value Ref Range Status   SARS Coronavirus 2 by RT PCR NEGATIVE NEGATIVE Final    Comment: (NOTE) SARS-CoV-2 target nucleic acids are NOT DETECTED.  The SARS-CoV-2 RNA is generally detectable in upper respiratory specimens during the acute phase of infection. The lowest concentration of SARS-CoV-2 viral copies this assay can detect is 138 copies/mL. A negative result does not preclude SARS-Cov-2 infection and should not be used as the sole basis for treatment or other patient management decisions. A negative result may occur with  improper specimen collection/handling, submission of specimen other than nasopharyngeal swab, presence of viral mutation(s) within the areas targeted by this assay, and inadequate number of viral copies(<138 copies/mL). A negative result must be combined with clinical observations, patient history, and epidemiological information. The expected result is Negative.  Fact Sheet for Patients:  BloggerCourse.com  Fact Sheet for Healthcare Providers:  SeriousBroker.it  This test is no t yet approved or cleared by the [place] FDA and  has been authorized for detection and/or diagnosis of SARS-CoV-2 by FDA under an Emergency Use Authorization (EUA). This EUA will remain  in effect (meaning this  test can be used) for the duration of the COVID-19 declaration under Section 564(b)(1) of the Act, 21 U.S.C.section 360bbb-3(b)(1), unless the authorization is terminated  or revoked sooner.       Influenza A by PCR NEGATIVE NEGATIVE Final   Influenza B by PCR NEGATIVE NEGATIVE Final    Comment: (NOTE) The Xpert Xpress SARS-CoV-2/FLU/RSV plus  assay is intended as an aid in the diagnosis of influenza from Nasopharyngeal swab specimens and should not be used as a sole basis for treatment. Nasal washings and aspirates are unacceptable for Xpert Xpress SARS-CoV-2/FLU/RSV testing.  Fact Sheet for Patients: BloggerCourse.com  Fact Sheet for Healthcare Providers: SeriousBroker.it  This test is not yet approved or cleared by the [place] FDA and has been authorized for detection and/or diagnosis of SARS-CoV-2 by FDA under an Emergency Use Authorization (EUA). This EUA will remain in effect (meaning this test can be used) for the duration of the COVID-19 declaration under Section 564(b)(1) of the Act, 21 U.S.C. section 360bbb-3(b)(1), unless the authorization is terminated or revoked.     Resp Syncytial Virus by PCR NEGATIVE NEGATIVE Final    Comment: (NOTE) Fact Sheet for Patients: BloggerCourse.com  Fact Sheet for Healthcare Providers: SeriousBroker.it  This test is not yet approved or cleared by the [place] FDA and has been authorized for detection and/or diagnosis of SARS-CoV-2 by FDA under an Emergency Use Authorization (EUA). This EUA will remain in effect (meaning this test can be used) for the duration of the COVID-19 declaration under Section 564(b)(1) of the Act, 21 U.S.C. section 360bbb-3(b)(1), unless the authorization is terminated or revoked.  Performed at [facility], [address] [place], [place] [address]   Respiratory (~20 pathogens) panel by PCR     Status: None   Collection Time: 08/14/23  3:44 AM   Specimen: Nasopharyngeal Swab; Respiratory  Result Value Ref Range Status   Adenovirus NOT DETECTED NOT DETECTED Final   Coronavirus 229E NOT DETECTED NOT DETECTED Final    Comment: (NOTE) The Coronavirus on the Respiratory Panel, DOES NOT test for the novel  Coronavirus (2019  nCoV)    Coronavirus HKU1 NOT DETECTED NOT DETECTED Final   Coronavirus NL63 NOT DETECTED NOT DETECTED Final   Coronavirus OC43 NOT DETECTED NOT DETECTED Final   Metapneumovirus NOT DETECTED NOT DETECTED Final   Rhinovirus / Enterovirus NOT DETECTED NOT DETECTED Final   Influenza A NOT DETECTED NOT DETECTED Final   Influenza B NOT DETECTED NOT DETECTED Final   Parainfluenza Virus 1 NOT DETECTED NOT DETECTED Final   Parainfluenza Virus 2 NOT DETECTED NOT DETECTED Final   Parainfluenza Virus 3 NOT DETECTED NOT DETECTED Final   Parainfluenza Virus 4 NOT DETECTED NOT DETECTED Final   Respiratory Syncytial Virus NOT DETECTED NOT DETECTED Final   Bordetella pertussis NOT DETECTED NOT DETECTED Final   Bordetella Parapertussis NOT DETECTED NOT DETECTED Final   Chlamydophila pneumoniae NOT DETECTED NOT DETECTED Final   Mycoplasma pneumoniae NOT DETECTED NOT DETECTED Final    Comment: Performed at [facility], [address] [place], [place] [address]  Culture, blood (Routine X 2) w Reflex to ID Panel     Status: None   Collection Time: 08/14/23  9:19 AM   Specimen: BLOOD  Result Value Ref Range Status   Specimen Description BLOOD BLOOD RIGHT HAND  Final   Special Requests   Final    BOTTLES DRAWN AEROBIC AND ANAEROBIC Blood Culture adequate volume   Culture   Final  NO GROWTH 5 DAYS Performed at [facility], [address] [place], [place] [address]    Report Status 08/19/2023 FINAL  Final  Culture, blood (Routine X 2) w Reflex to ID Panel     Status: None   Collection Time: 08/14/23  9:19 AM   Specimen: BLOOD  Result Value Ref Range Status   Specimen Description BLOOD BLOOD LEFT HAND  Final   Special Requests   Final    BOTTLES DRAWN AEROBIC AND ANAEROBIC Blood Culture adequate volume   Culture   Final    NO GROWTH 5 DAYS Performed at [facility], [address] [place], [place] [address]    Report Status 08/19/2023 FINAL  Final  Ear culture      Status: None   Collection Time: 08/14/23 10:11 AM   Specimen: Ear; Other  Result Value Ref Range Status   Specimen Description   Final    EAR Performed at [facility], [address] [place], [place] [address]    Special Requests   Final    RIGHT EAR Performed at [facility], [address] [place], [place] [address]    Culture RARE STREPTOCOCCUS PNEUMONIAE  Final   Report Status 08/16/2023 FINAL  Final   Organism ID, Bacteria STREPTOCOCCUS PNEUMONIAE  Final      Susceptibility   Streptococcus pneumoniae - MIC*    ERYTHROMYCIN <=0.12 SENSITIVE Sensitive     LEVOFLOXACIN 1 SENSITIVE Sensitive     VANCOMYCIN 0.5 SENSITIVE Sensitive     PENICILLIN (meningitis) <=0.06 SENSITIVE Sensitive     PENO - penicillin <=0.06      PENICILLIN (non-meningitis) <=0.06 SENSITIVE Sensitive     PENICILLIN (oral) <=0.06 SENSITIVE Sensitive     CEFTRIAXONE (non-meningitis) <=0.12 SENSITIVE Sensitive     CEFTRIAXONE (meningitis) <=0.12 SENSITIVE Sensitive     * RARE STREPTOCOCCUS PNEUMONIAE    Coagulation Studies: No results for input(s): "LABPROT", "INR" in the last 72 hours.  Urinalysis: No results for input(s): "COLORURINE", "LABSPEC", "PHURINE", "GLUCOSEU", "HGBUR", "BILIRUBINUR", "KETONESUR", "PROTEINUR", "UROBILINOGEN", "NITRITE", "LEUKOCYTESUR" in the last 72 hours.  Invalid input(s): "APPERANCEUR"    Imaging: No results found.    Medications:     aspirin EC  81 mg Oral Daily   atorvastatin  40 mg Oral Daily   calcium carbonate  400 mg of elemental calcium Oral BID   Chlorhexidine Gluconate Cloth  6 each Topical Q0600   cinacalcet  30 mg Oral Q supper   epoetin alfa  10,000 Units Intravenous Q T,Th,Sa-HD   fluticasone  1 spray Each Nare Daily   gabapentin  200 mg Oral BID   heparin  5,000 Units Subcutaneous Q12H   hydrALAZINE  100 mg Oral TID   hydrOXYzine  25 mg Oral TID   isosorbide mononitrate  120 mg Oral Daily   labetalol  600 mg Oral BID    losartan  100 mg Oral Daily   pantoprazole  40 mg Oral Daily   senna-docusate  2 tablet Oral BID   sevelamer carbonate  1,600 mg Oral TID with meals   terazosin  2 mg Oral BID   guaiFENesin-dextromethorphan, ondansetron (ZOFRAN) IV, oxyCODONE  Assessment/ Plan:  [name] is a 53 y.o.  female  with past medical conditions including hypertension, CHF, anemia, and end-stage renal disease on hemodialysis, who was admitted to [facility] on 08/09/2023 for Hypertensive urgency [I16.0] Chest pain [R07.9] Nonspecific chest pain [R07.9] Anemia due to chronic kidney disease, on chronic dialysis [facility]) [  N18.6, D63.1, Z99.2]  Outpatient dialysis clinic has been arranged at [facility] on a TTS schedule, chair time at 530 am.     1.  End stage renal disease on hemodialysis.  Patient received dialysis yesterday, UF 2.4 L achieved.  Next treatment scheduled for Thursday.    2. Anemia of chronic kidney disease Hemoglobin & Hematocrit     Component Value Date/Time   HGB 7.6 (L) 08/25/2023 0811   HCT 22.8 (L) 08/25/2023 0811  Remains decreased. Continue EPO with  dialysis treatment.   3. Hypertensive urgency, on admission.  Patient with reasonable blood pressure control now.  Continue amlodipine, hydralazine, labetalol, losartan, and terazosin  Blood pressure remains acceptable for this patient.   4. Secondary Hyperparathyroidism: with outpatient labs: None available Monitoring bone minerals during this admission.    LOS: 0 Kateryna Grantham 3/26/20251:41 PM

## 2023-08-27 DIAGNOSIS — I132 Hypertensive heart and chronic kidney disease with heart failure and with stage 5 chronic kidney disease, or end stage renal disease: Secondary | ICD-10-CM | POA: Diagnosis not present

## 2023-08-27 DIAGNOSIS — R0789 Other chest pain: Secondary | ICD-10-CM | POA: Diagnosis not present

## 2023-08-27 LAB — CBC
HCT: 23.3 % — ABNORMAL LOW (ref 36.0–46.0)
Hemoglobin: 7.7 g/dL — ABNORMAL LOW (ref 12.0–15.0)
MCH: 27.2 pg (ref 26.0–34.0)
MCHC: 33 g/dL (ref 30.0–36.0)
MCV: 82.3 fL (ref 80.0–100.0)
Platelets: 214 10*3/uL (ref 150–400)
RBC: 2.83 MIL/uL — ABNORMAL LOW (ref 3.87–5.11)
RDW: 18 % — ABNORMAL HIGH (ref 11.5–15.5)
WBC: 8.2 10*3/uL (ref 4.0–10.5)
nRBC: 0 % (ref 0.0–0.2)

## 2023-08-27 LAB — RENAL FUNCTION PANEL
Albumin: 3.3 g/dL — ABNORMAL LOW (ref 3.5–5.0)
Anion gap: 14 (ref 5–15)
BUN: 97 mg/dL — ABNORMAL HIGH (ref 6–20)
CO2: 26 mmol/L (ref 22–32)
Calcium: 7.9 mg/dL — ABNORMAL LOW (ref 8.9–10.3)
Chloride: 98 mmol/L (ref 98–111)
Creatinine, Ser: 7.38 mg/dL — ABNORMAL HIGH (ref 0.44–1.00)
GFR, Estimated: 6 mL/min — ABNORMAL LOW (ref >60)
Glucose, Bld: 95 mg/dL (ref 70–99)
Phosphorus: 4.7 mg/dL — ABNORMAL HIGH (ref 2.5–4.6)
Potassium: 5.3 mmol/L — ABNORMAL HIGH (ref 3.5–5.1)
Sodium: 138 mmol/L (ref 135–145)

## 2023-08-27 MED ORDER — HEPARIN SODIUM (PORCINE) 1000 UNIT/ML IJ SOLN
INTRAMUSCULAR | Status: AC
  Filled 2023-08-27: qty 10

## 2023-08-27 MED ORDER — HEPARIN SODIUM (PORCINE) 1000 UNIT/ML DIALYSIS: 1000.0000 [IU] | INTRAMUSCULAR | Status: DC | PRN

## 2023-08-27 MED ORDER — ALTEPLASE 2 MG IJ SOLR: 2.0000 mg | Freq: Once | INTRAMUSCULAR | Status: DC | PRN

## 2023-08-27 NOTE — Plan of Care (Signed)

## 2023-08-27 NOTE — Progress Notes (Deleted)
 Mobility Specialist - Progress Note   08/27/23 1129  Mobility  Activity Contraindicated/medical hold     Per chart review, pt K+ 6.2 sitting outside perimeters for mobility. Will attempt another date/time as medically appropriate.    Filiberto Pinks Mobility Specialist 08/27/23, 11:31 AM

## 2023-08-27 NOTE — Progress Notes (Signed)
 Hemodialysis note  Received patient in bed to unit. Alert and oriented.  Informed consent signed and in chart.  Tx duration: 3.5 hours  Patient tolerated well. Transported back to room, alert without acute distress.  Report given to patient's RN.   Access used: Right Chest HD Catheter Access issues: none  Total UF removed: 2L Medication(s) given:  Retacrit 10 000 units IV  Post HD weight: 64.1 kg   Wolfgang Phoenix [facility]

## 2023-08-27 NOTE — Progress Notes (Addendum)
 Progress Note    Allison Hill  YQM:[id number] DOB: [birth year]  DOA: 08/09/2023 PCP: Melvenia Beam, MD      Brief Narrative:    Medical records reviewed and are as summarized below:  Allison Hill is a 53 y.o. female with medical history significant of ESRD on HD MWF, refractory HTN, chronic HFpEF, chronic iron deficiency anemia admitted to hospitalist service for evaluation of chest pain, hypertensive urgency.  Patient's chest pain atypical, troponins negative.  Blood pressure improved with home regimen.  Patient lives with her daughter who does not want to take her home given high risk for falls, not safe for her HD transportation.  HD outpatient facility arranged.  TOC working on placement   On 08/13/2023/08/14/2023 patient is complaining of severe pain in her right ear that is not controlled by tylenol. The pain has not improved with IV Unasyn. CT head demonstrated mastoiditis and otitis media with swelling of the external canal. The patient was given IV Fentanyl for pain and antibiotics were expanded to include cipro PO. She will undergo dialysis on Saturday as part of her usual schedule.  PT/OT recommended nursing home placement, however insurance company denied the application.      Assessment/Plan:   Principal Problem:   Chest pain Active Problems:   Otitis media not resolved, right   Hypertensive urgency   ESRD on dialysis (HCC)   Right upper quadrant abdominal pain    Body mass index is 25.03 kg/m.   Right otitis media Resolved. Completed 7 days of antibiotics on 08/20/2023 (including IV Unasyn followed by ciprofloxacin and Levaquin).    Hypertensive urgency Orthostatic hypotension. BP stable Continue labetalol, losartan, terazosin and hydralazine. Amlodipine was discontinued because BP was running low.    ESRD on dialysis (HCC) Hyperkalemia Hypocalcemia Anemia of chronic kidney disease Plan for hemodialysis today Continue Epogen,  Cinacalcet, sevelamer and calcium carbonate Follow-up with nephrologist for hemodialysis    Chest pain Resolved    Right upper quadrant abdominal pain Improved.  Continue Protonix US of the RUQ of the abdomen is negative for cholecystitis or cholelithiasis.      General weakness No safe discharge plan.  Awaiting placement to long-term care facility Follow-up with transition of care team to assist with disposition   Transfer from progressive care unit to MedSurg unit.   Diet Order             Diet Heart Room service appropriate? Yes; Fluid consistency: Thin  Diet effective now                            Consultants: Nephrologist  Procedures: None    Medications:    aspirin EC  81 mg Oral Daily   atorvastatin  40 mg Oral Daily   calcium carbonate  400 mg of elemental calcium Oral BID   Chlorhexidine Gluconate Cloth  6 each Topical Q0600   cinacalcet  30 mg Oral Q supper   epoetin alfa  10,000 Units Intravenous Q T,Th,Sa-HD   fluticasone  1 spray Each Nare Daily   gabapentin  200 mg Oral BID   heparin  5,000 Units Subcutaneous Q12H   hydrALAZINE  100 mg Oral TID   hydrOXYzine  25 mg Oral TID   isosorbide mononitrate  120 mg Oral Daily   labetalol  600 mg Oral BID   losartan  100 mg Oral Daily   pantoprazole  40 mg Oral Daily   senna-docusate  2 tablet Oral BID   sevelamer carbonate  1,600 mg Oral TID with meals   terazosin  2 mg Oral BID   Continuous Infusions:   Anti-infectives (From admission, onward)    Start     Dose/Rate Route Frequency Ordered Stop   08/19/23 1800  levofloxacin (LEVAQUIN) tablet 250 mg        250 mg Oral Every evening 08/19/23 1504 08/20/23 1739   08/15/23 1800  ciprofloxacin (CIPRO) tablet 500 mg  Status:  Discontinued        500 mg Oral Every evening 08/14/23 0856 08/19/23 1502   08/14/23 0915  ciprofloxacin (CIPRO) tablet 500 mg        500 mg Oral  Once 08/14/23 0856 08/14/23 1001   08/14/23 0615   Ampicillin-Sulbactam (UNASYN) 3 g in sodium chloride 0.9 % 100 mL IVPB  Status:  Discontinued        3 g 200 mL/hr over 30 Minutes Intravenous Every 12 hours 08/14/23 0528 08/16/23 1427              Family Communication/Anticipated D/C date and plan/Code Status   DVT prophylaxis: heparin injection 5,000 Units Start: 08/09/23 1000     Code Status: Full Code  Family Communication: None Disposition Plan: Plan to discharge to SNF   Status is: Observation The patient will require care spanning > 2 midnights and should be moved to inpatient because: No safe discharge plan       Subjective:   Interval events noted.  She complains of right shoulder pain from arthritis.  No other complaints.  Objective:    Vitals:   08/27/23 1530 08/27/23 1548 08/27/23 1551 08/27/23 1611  BP: 123/82 129/87 129/87 (!) 140/92  Pulse: 78 78 78 80  Resp: 15 16 16 18   Temp:  98.4 F (36.9 C)  98 F (36.7 C)  TempSrc:  Oral  Oral  SpO2: 99% 100% 99% 100%  Weight:   64.1 kg   Height:       No data found.   Intake/Output Summary (Last 24 hours) at 08/27/2023 1650 Last data filed at 08/27/2023 1551 Gross per 24 hour  Intake 720 ml  Output 2000 ml  Net -1280 ml   Filed Weights   08/27/23 0501 08/27/23 1157 08/27/23 1551  Weight: 68 kg 65.6 kg 64.1 kg    Exam:  GEN: NAD SKIN: Warm and dry EYES: No pallor or icterus ENT: MMM CV: RRR PULM: CTA B ABD: soft, ND, NT, +BS CNS: AAO x 3, non focal EXT: No edema or tenderness.  Normal range of motion of right shoulder joint         Data Reviewed:   I have personally reviewed following labs and imaging studies:  Labs: Labs show the following:   Basic Metabolic Panel: Recent Labs  Lab 08/22/23 0804 08/25/23 0811 08/27/23 1220  NA 133* 138 138  K 5.2* 6.2* 5.3*  CL 97* 103 98  CO2 26 23 26   GLUCOSE 73 85 95  BUN 95* 113* 97*  CREATININE 6.88* 8.57* 7.38*  CALCIUM 7.4* 7.6* 7.9*  PHOS 4.6 5.4* 4.7*    GFR Estimated Creatinine Clearance: 8 mL/min (A) (by C-G formula based on SCr of 7.38 mg/dL (H)). Liver Function Tests: Recent Labs  Lab 08/22/23 0804 08/25/23 0811 08/27/23 1220  ALBUMIN 3.1* 3.1* 3.3*   No results for input(s): "LIPASE", "AMYLASE" in the last 168 hours. No results for input(s): "AMMONIA" in the last 168 hours. Coagulation profile No  results for input(s): "INR", "PROTIME" in the last 168 hours.  CBC: Recent Labs  Lab 08/22/23 0804 08/25/23 0811 08/27/23 1220  WBC 9.5 8.8 8.2  HGB 7.7* 7.6* 7.7*  HCT 23.6* 22.8* 23.3*  MCV 84.0 84.8 82.3  PLT 254 245 214   Cardiac Enzymes: No results for input(s): "CKTOTAL", "CKMB", "CKMBINDEX", "TROPONINI" in the last 168 hours. BNP (last 3 results) No results for input(s): "PROBNP" in the last 8760 hours. CBG: No results for input(s): "GLUCAP" in the last 168 hours. D-Dimer: No results for input(s): "DDIMER" in the last 72 hours. Hgb A1c: No results for input(s): "HGBA1C" in the last 72 hours. Lipid Profile: No results for input(s): "CHOL", "HDL", "LDLCALC", "TRIG", "CHOLHDL", "LDLDIRECT" in the last 72 hours. Thyroid function studies: No results for input(s): "TSH", "T4TOTAL", "T3FREE", "THYROIDAB" in the last 72 hours.  Invalid input(s): "FREET3" Anemia work up: No results for input(s): "VITAMINB12", "FOLATE", "FERRITIN", "TIBC", "IRON", "RETICCTPCT" in the last 72 hours. Sepsis Labs: Recent Labs  Lab 08/22/23 0804 08/25/23 0811 08/27/23 1220  WBC 9.5 8.8 8.2    Microbiology No results found for this or any previous visit (from the past 240 hours).  Procedures and diagnostic studies:  No results found.             LOS: 0 days   Cathi Hazan  [facility]   Pager on [email or web address]. If 7PM-7AM, please contact night-coverage at [email or web address]     08/27/2023, 4:50 PM

## 2023-08-27 NOTE — Progress Notes (Signed)
 [facility]  ROUNDING NOTE   Subjective:   Patient seen sitting up in bed Partially completed breakfast tray at bedside Scheduled for dialysis later today  Objective:  Vital signs in last 24 hours:  Temp:  [97.9 F (36.6 C)-98.6 F (37 C)] 98 F (36.7 C) (03/27 1611) Pulse Rate:  [78-88] 80 (03/27 1611) Resp:  [12-23] 18 (03/27 1611) BP: (111-186)/(71-117) 140/92 (03/27 1611) SpO2:  [95 %-100 %] 100 % (03/27 1611) Weight:  [64.1 kg-68 kg] 64.1 kg (03/27 1551)  Weight change: 2.339 kg Filed Weights   08/27/23 0501 08/27/23 1157 08/27/23 1551  Weight: 68 kg 65.6 kg 64.1 kg    Intake/Output: I/O last 3 completed shifts: In: 240 [P.O.:240] Out: -    Intake/Output this shift:  Total I/O In: 480 [P.O.:480] Out: 2000 [Other:2000]  Physical Exam: General: NAD  Head: Normocephalic, atraumatic. Moist oral mucosal membranes  Eyes: Anicteric  Lungs:  Clear to auscultation  Heart: Regular rate and rhythm  Abdomen:  Soft, nontender  Extremities: No peripheral edema.  Neurologic: Alert, moving all four extremities  Skin: No lesions  Access: Rt Chest Permcath    Basic Metabolic Panel: Recent Labs  Lab 08/22/23 0804 08/25/23 0811 08/27/23 1220  NA 133* 138 138  K 5.2* 6.2* 5.3*  CL 97* 103 98  CO2 26 23 26   GLUCOSE 73 85 95  BUN 95* 113* 97*  CREATININE 6.88* 8.57* 7.38*  CALCIUM 7.4* 7.6* 7.9*  PHOS 4.6 5.4* 4.7*    Liver Function Tests: Recent Labs  Lab 08/22/23 0804 08/25/23 0811 08/27/23 1220  ALBUMIN 3.1* 3.1* 3.3*    No results for input(s): "LIPASE", "AMYLASE" in the last 168 hours. No results for input(s): "AMMONIA" in the last 168 hours.  CBC: Recent Labs  Lab 08/22/23 0804 08/25/23 0811 08/27/23 1220  WBC 9.5 8.8 8.2  HGB 7.7* 7.6* 7.7*  HCT 23.6* 22.8* 23.3*  MCV 84.0 84.8 82.3  PLT 254 245 214    Cardiac Enzymes: No results for input(s): "CKTOTAL", "CKMB", "CKMBINDEX", "TROPONINI" in the last 168  hours.  BNP: Invalid input(s): "POCBNP"  CBG: No results for input(s): "GLUCAP" in the last 168 hours.  Microbiology: Results for orders placed or performed during the hospital encounter of 08/09/23  Resp panel by RT-PCR (RSV, Flu A&B, Covid) Anterior Nasal Swab     Status: None   Collection Time: 08/14/23  3:44 AM   Specimen: Anterior Nasal Swab  Result Value Ref Range Status   SARS Coronavirus 2 by RT PCR NEGATIVE NEGATIVE Final    Comment: (NOTE) SARS-CoV-2 target nucleic acids are NOT DETECTED.  The SARS-CoV-2 RNA is generally detectable in upper respiratory specimens during the acute phase of infection. The lowest concentration of SARS-CoV-2 viral copies this assay can detect is 138 copies/mL. A negative result does not preclude SARS-Cov-2 infection and should not be used as the sole basis for treatment or other patient management decisions. A negative result may occur with  improper specimen collection/handling, submission of specimen other than nasopharyngeal swab, presence of viral mutation(s) within the areas targeted by this assay, and inadequate number of viral copies(<138 copies/mL). A negative result must be combined with clinical observations, patient history, and epidemiological information. The expected result is Negative.  Fact Sheet for Patients:  BloggerCourse.com  Fact Sheet for Healthcare Providers:  SeriousBroker.it  This test is no t yet approved or cleared by the [place] FDA and  has been authorized for detection and/or diagnosis of SARS-CoV-2 by  FDA under an Emergency Use Authorization (EUA). This EUA will remain  in effect (meaning this test can be used) for the duration of the COVID-19 declaration under Section 564(b)(1) of the Act, 21 U.S.C.section 360bbb-3(b)(1), unless the authorization is terminated  or revoked sooner.       Influenza A by PCR NEGATIVE NEGATIVE Final   Influenza B by  PCR NEGATIVE NEGATIVE Final    Comment: (NOTE) The Xpert Xpress SARS-CoV-2/FLU/RSV plus assay is intended as an aid in the diagnosis of influenza from Nasopharyngeal swab specimens and should not be used as a sole basis for treatment. Nasal washings and aspirates are unacceptable for Xpert Xpress SARS-CoV-2/FLU/RSV testing.  Fact Sheet for Patients: BloggerCourse.com  Fact Sheet for Healthcare Providers: SeriousBroker.it  This test is not yet approved or cleared by the [place] FDA and has been authorized for detection and/or diagnosis of SARS-CoV-2 by FDA under an Emergency Use Authorization (EUA). This EUA will remain in effect (meaning this test can be used) for the duration of the COVID-19 declaration under Section 564(b)(1) of the Act, 21 U.S.C. section 360bbb-3(b)(1), unless the authorization is terminated or revoked.     Resp Syncytial Virus by PCR NEGATIVE NEGATIVE Final    Comment: (NOTE) Fact Sheet for Patients: BloggerCourse.com  Fact Sheet for Healthcare Providers: SeriousBroker.it  This test is not yet approved or cleared by the [place] FDA and has been authorized for detection and/or diagnosis of SARS-CoV-2 by FDA under an Emergency Use Authorization (EUA). This EUA will remain in effect (meaning this test can be used) for the duration of the COVID-19 declaration under Section 564(b)(1) of the Act, 21 U.S.C. section 360bbb-3(b)(1), unless the authorization is terminated or revoked.  Performed at [facility], [address] [place], [place] [address]   Respiratory (~20 pathogens) panel by PCR     Status: None   Collection Time: 08/14/23  3:44 AM   Specimen: Nasopharyngeal Swab; Respiratory  Result Value Ref Range Status   Adenovirus NOT DETECTED NOT DETECTED Final   Coronavirus 229E NOT DETECTED NOT DETECTED Final    Comment: (NOTE) The  Coronavirus on the Respiratory Panel, DOES NOT test for the novel  Coronavirus (2019 nCoV)    Coronavirus HKU1 NOT DETECTED NOT DETECTED Final   Coronavirus NL63 NOT DETECTED NOT DETECTED Final   Coronavirus OC43 NOT DETECTED NOT DETECTED Final   Metapneumovirus NOT DETECTED NOT DETECTED Final   Rhinovirus / Enterovirus NOT DETECTED NOT DETECTED Final   Influenza A NOT DETECTED NOT DETECTED Final   Influenza B NOT DETECTED NOT DETECTED Final   Parainfluenza Virus 1 NOT DETECTED NOT DETECTED Final   Parainfluenza Virus 2 NOT DETECTED NOT DETECTED Final   Parainfluenza Virus 3 NOT DETECTED NOT DETECTED Final   Parainfluenza Virus 4 NOT DETECTED NOT DETECTED Final   Respiratory Syncytial Virus NOT DETECTED NOT DETECTED Final   Bordetella pertussis NOT DETECTED NOT DETECTED Final   Bordetella Parapertussis NOT DETECTED NOT DETECTED Final   Chlamydophila pneumoniae NOT DETECTED NOT DETECTED Final   Mycoplasma pneumoniae NOT DETECTED NOT DETECTED Final    Comment: Performed at [facility], [address] [place], [place] [address]  Culture, blood (Routine X 2) w Reflex to ID Panel     Status: None   Collection Time: 08/14/23  9:19 AM   Specimen: BLOOD  Result Value Ref Range Status   Specimen Description BLOOD BLOOD RIGHT HAND  Final   Special Requests   Final  BOTTLES DRAWN AEROBIC AND ANAEROBIC Blood Culture adequate volume   Culture   Final    NO GROWTH 5 DAYS Performed at [facility], [address] [place], [place] [address]    Report Status 08/19/2023 FINAL  Final  Culture, blood (Routine X 2) w Reflex to ID Panel     Status: None   Collection Time: 08/14/23  9:19 AM   Specimen: BLOOD  Result Value Ref Range Status   Specimen Description BLOOD BLOOD LEFT HAND  Final   Special Requests   Final    BOTTLES DRAWN AEROBIC AND ANAEROBIC Blood Culture adequate volume   Culture   Final    NO GROWTH 5 DAYS Performed at [facility], [address] [place], [place] [address]    Report Status 08/19/2023 FINAL  Final  Ear culture     Status: None   Collection Time: 08/14/23 10:11 AM   Specimen: Ear; Other  Result Value Ref Range Status   Specimen Description   Final    EAR Performed at [facility], [address] [place], [place] [address]    Special Requests   Final    RIGHT EAR Performed at [facility], [address] [place], [place] [address]    Culture RARE STREPTOCOCCUS PNEUMONIAE  Final   Report Status 08/16/2023 FINAL  Final   Organism ID, Bacteria STREPTOCOCCUS PNEUMONIAE  Final      Susceptibility   Streptococcus pneumoniae - MIC*    ERYTHROMYCIN <=0.12 SENSITIVE Sensitive     LEVOFLOXACIN 1 SENSITIVE Sensitive     VANCOMYCIN 0.5 SENSITIVE Sensitive     PENICILLIN (meningitis) <=0.06 SENSITIVE Sensitive     PENO - penicillin <=0.06      PENICILLIN (non-meningitis) <=0.06 SENSITIVE Sensitive     PENICILLIN (oral) <=0.06 SENSITIVE Sensitive     CEFTRIAXONE (non-meningitis) <=0.12 SENSITIVE Sensitive     CEFTRIAXONE (meningitis) <=0.12 SENSITIVE Sensitive     * RARE STREPTOCOCCUS PNEUMONIAE    Coagulation Studies: No results for input(s): "LABPROT", "INR" in the last 72 hours.  Urinalysis: No results for input(s): "COLORURINE", "LABSPEC", "PHURINE", "GLUCOSEU", "HGBUR", "BILIRUBINUR", "KETONESUR", "PROTEINUR", "UROBILINOGEN", "NITRITE", "LEUKOCYTESUR" in the last 72 hours.  Invalid input(s): "APPERANCEUR"    Imaging: No results found.    Medications:     aspirin EC  81 mg Oral Daily   atorvastatin  40 mg Oral Daily   calcium carbonate  400 mg of elemental calcium Oral BID   Chlorhexidine Gluconate Cloth  6 each Topical Q0600   cinacalcet  30 mg Oral Q supper   epoetin alfa  10,000 Units Intravenous Q T,Th,Sa-HD   fluticasone  1 spray Each Nare Daily   gabapentin  200 mg Oral BID   heparin  5,000 Units Subcutaneous Q12H   hydrALAZINE  100 mg Oral TID   hydrOXYzine  25 mg Oral  TID   isosorbide mononitrate  120 mg Oral Daily   labetalol  600 mg Oral BID   losartan  100 mg Oral Daily   pantoprazole  40 mg Oral Daily   senna-docusate  2 tablet Oral BID   sevelamer carbonate  1,600 mg Oral TID with meals   terazosin  2 mg Oral BID   guaiFENesin-dextromethorphan, ondansetron (ZOFRAN) IV, oxyCODONE  Assessment/ Plan:  [name] is a 53 y.o.  female  with past medical conditions including hypertension, CHF, anemia, and end-stage renal disease on hemodialysis, who was admitted to [facility] on 08/09/2023 for Hypertensive urgency [  I16.0] Chest pain [R07.9] Nonspecific chest pain [R07.9] Anemia due to chronic kidney disease, on chronic dialysis (HCC) [N18.6, D63.1, Z99.2]  Outpatient dialysis clinic has been arranged at [facility] on a TTS schedule, chair time at 530 am.     1.  End stage renal disease on hemodialysis.  Scheduled to receive dialysis later today.      2. Anemia of chronic kidney disease Hemoglobin & Hematocrit     Component Value Date/Time   HGB 7.7 (L) 08/27/2023 1220   HCT 23.3 (L) 08/27/2023 1220  Hemoglobin 7.7. Continue IV EPO 10,000 units with  dialysis treatment.   3. Hypertensive urgency, on admission.  Patient with reasonable blood pressure control now.  Continue amlodipine, hydralazine, labetalol, losartan, and terazosin  Blood pressure elevated today.  Will assess with dialysis.   4. Secondary Hyperparathyroidism: with outpatient labs: None available Will continue to monitor bone minerals.  Currently prescribed calcium carbonate, sevelamer and Cinacalcet.    LOS: 0 Stevon Gough 3/27/20254:43 PM

## 2023-08-28 DIAGNOSIS — R0789 Other chest pain: Secondary | ICD-10-CM | POA: Diagnosis not present

## 2023-08-28 DIAGNOSIS — I132 Hypertensive heart and chronic kidney disease with heart failure and with stage 5 chronic kidney disease, or end stage renal disease: Secondary | ICD-10-CM | POA: Diagnosis not present

## 2023-08-28 MED ORDER — BISACODYL 10 MG RE SUPP
10.0000 mg | Freq: Once | RECTAL | Status: AC
  Administered 2023-09-01: 10 mg via RECTAL
  Filled 2023-08-28: qty 1

## 2023-08-28 MED ORDER — MELATONIN 5 MG PO TABS
5.0000 mg | ORAL_TABLET | Freq: Once | ORAL | Status: AC
Start: 2023-08-28 — End: 2023-08-28
  Administered 2023-08-28: 5 mg via ORAL
  Filled 2023-08-28: qty 1

## 2023-08-28 MED ORDER — BISACODYL 5 MG PO TBEC
10.0000 mg | DELAYED_RELEASE_TABLET | Freq: Once | ORAL | Status: AC
  Administered 2023-08-28: 10 mg via ORAL
  Filled 2023-08-28: qty 2

## 2023-08-28 NOTE — Progress Notes (Signed)
 Physical Therapy Treatment Patient Details Name: Allison Hill MRN: [id number] DOB: 26-Jun-1970 Today's Date: 08/28/2023   History of Present Illness Pt is a 53 y.o. female with medical history significant of ESRD on HD MWF, refractory HTN, chronic HFpEF, chronic iron deficiency anemia presented with new onset of chest pain, workup for hypertensive emergency.    PT Comments  Patient alert, agreeable to PT with some encouragement. Supervision for bed mobility and transfers with rollator. She ambulated ~278ft with rollator, wide BOS and very shuffled step. Able to minimally correct with time and verbal cues. Pt did show fatigue towards end of ambulation (complained of fatigue as well), and varying gait speed. Pt declined OOB at end of session, requested to take a nap. She was able to recall 3/4 HEP exercises and demonstrate them. The patient would benefit from further skilled PT intervention to continue to progress towards goals.     If plan is discharge home, recommend the following: A little help with walking and/or transfers;A little help with bathing/dressing/bathroom;Assistance with cooking/housework;Assist for transportation;Help with stairs or ramp for entrance   Can travel by private vehicle     Yes  Equipment Recommendations  None recommended by PT    Recommendations for Other Services       Precautions / Restrictions Precautions Precautions: Fall Recall of Precautions/Restrictions: Impaired Restrictions Weight Bearing Restrictions Per Provider Order: No     Mobility  Bed Mobility                    Transfers Overall transfer level: Needs assistance Equipment used: Rollator (4 wheels) Transfers: Sit to/from Stand Sit to Stand: Supervision                Ambulation/Gait Ambulation/Gait assistance: Contact guard assist, Supervision Gait Distance (Feet): 200 Feet Assistive device: Rollator (4 wheels)   Gait velocity: varying speeds, but overall  decreaseed     General Gait Details: very wide BOS, shuffled step, but able to improve with time and verbal cues, pt started to report fatigue towards end of ambulation   Stairs             Wheelchair Mobility     Tilt Bed    Modified Rankin (Stroke Patients Only)       Balance Overall balance assessment: Needs assistance Sitting-balance support: Feet supported Sitting balance-Leahy Scale: Good     Standing balance support: Bilateral upper extremity supported, Reliant on assistive device for balance, During functional activity Standing balance-Leahy Scale: Fair                              Communication    Cognition Arousal: Alert Behavior During Therapy: WFL for tasks assessed/performed   PT - Cognitive impairments: No family/caregiver present to determine baseline                         Following commands: Intact Following commands impaired: Follows multi-step commands inconsistently    Cueing    Exercises Other Exercises Other Exercises: pt able to recall and demonstrate 3/4 HEP exercises from last session    General Comments        Pertinent Vitals/Pain Pain Assessment Pain Assessment: No/denies pain    Home Living                          Prior Function  PT Goals (current goals can now be found in the care plan section) Progress towards PT goals: Progressing toward goals    Frequency    Min 1X/week      PT Plan      Co-evaluation              AM-PAC PT "6 Clicks" Mobility   Outcome Measure  Help needed turning from your back to your side while in a flat bed without using bedrails?: None Help needed moving from lying on your back to sitting on the side of a flat bed without using bedrails?: A Little Help needed moving to and from a bed to a chair (including a wheelchair)?: A Little Help needed standing up from a chair using your arms (e.g., wheelchair or bedside chair)?: A  Little Help needed to walk in hospital room?: A Little Help needed climbing 3-5 steps with a railing? : A Little 6 Click Score: 19    End of Session Equipment Utilized During Treatment: Gait belt Activity Tolerance: Patient tolerated treatment well Patient left: in bed;with call bell/phone within reach;Other (comment);with bed alarm set Nurse Communication: Mobility status PT Visit Diagnosis: Other abnormalities of gait and mobility (R26.89);Difficulty in walking, not elsewhere classified (R26.2);Muscle weakness (generalized) (M62.81)     Time: 1049-1100 PT Time Calculation (min) (ACUTE ONLY): 11 min  Charges:    $Therapeutic Activity: 8-22 mins PT General Charges $$ ACUTE PT VISIT: 1 Visit                     Olga Coaster PT, DPT 12:12 PM,08/28/23

## 2023-08-28 NOTE — Progress Notes (Signed)
 Progress Note    Allison Hill  LKG:[id number] DOB: 1971-02-26  DOA: 08/09/2023 PCP: Melvenia Beam, MD      Brief Narrative:    Medical records reviewed and are as summarized below:  Allison Hill is a 53 y.o. female with medical history significant of ESRD on HD MWF, refractory HTN, chronic HFpEF, chronic iron deficiency anemia admitted to hospitalist service for evaluation of chest pain, hypertensive urgency.  Patient's chest pain atypical, troponins negative.  Blood pressure improved with home regimen.  Patient lives with her daughter who does not want to take her home given high risk for falls, not safe for her HD transportation.  HD outpatient facility arranged.  TOC working on placement   On 08/13/2023/08/14/2023 patient is complaining of severe pain in her right ear that is not controlled by tylenol. The pain has not improved with IV Unasyn. CT head demonstrated mastoiditis and otitis media with swelling of the external canal. The patient was given IV Fentanyl for pain and antibiotics were expanded to include cipro PO. She will undergo dialysis on Saturday as part of her usual schedule.  PT/OT recommended nursing home placement, however insurance company denied the application.      Assessment/Plan:   Principal Problem:   Chest pain Active Problems:   Otitis media not resolved, right   Hypertensive urgency   ESRD on dialysis (HCC)   Right upper quadrant abdominal pain    Body mass index is 25.03 kg/m.   Right otitis media Resolved. Completed 7 days of antibiotics on 08/20/2023 (including IV Unasyn followed by ciprofloxacin and Levaquin).    Hypertensive urgency Orthostatic hypotension. BP stable Continue labetalol, losartan, terazosin and hydralazine. Amlodipine was discontinued because BP was running low.    ESRD on dialysis (HCC) Hyperkalemia Hypocalcemia Anemia of chronic kidney disease Plan for hemodialysis today Continue Epogen,  Cinacalcet, sevelamer and calcium carbonate Follow-up with nephrologist for hemodialysis    Chest pain Resolved    Right upper quadrant abdominal pain Improved.  Continue Protonix US of the RUQ of the abdomen is negative for cholecystitis or cholelithiasis.      General weakness No safe discharge plan.  Awaiting placement to long-term care facility Follow-up with transition of care team to assist with disposition   Analgesics as needed for shoulder osteoarthritis   Awaiting placement to SNF   Diet Order             Diet Heart Room service appropriate? Yes; Fluid consistency: Thin  Diet effective now                            Consultants: Nephrologist  Procedures: None    Medications:    aspirin EC  81 mg Oral Daily   atorvastatin  40 mg Oral Daily   calcium carbonate  400 mg of elemental calcium Oral BID   Chlorhexidine Gluconate Cloth  6 each Topical Q0600   cinacalcet  30 mg Oral Q supper   epoetin alfa  10,000 Units Intravenous Q T,Th,Sa-HD   fluticasone  1 spray Each Nare Daily   gabapentin  200 mg Oral BID   heparin  5,000 Units Subcutaneous Q12H   hydrALAZINE  100 mg Oral TID   hydrOXYzine  25 mg Oral TID   isosorbide mononitrate  120 mg Oral Daily   labetalol  600 mg Oral BID   losartan  100 mg Oral Daily   pantoprazole  40 mg Oral  Daily   senna-docusate  2 tablet Oral BID   sevelamer carbonate  1,600 mg Oral TID with meals   terazosin  2 mg Oral BID   Continuous Infusions:   Anti-infectives (From admission, onward)    Start     Dose/Rate Route Frequency Ordered Stop   08/19/23 1800  levofloxacin (LEVAQUIN) tablet 250 mg        250 mg Oral Every evening 08/19/23 1504 08/20/23 1739   08/15/23 1800  ciprofloxacin (CIPRO) tablet 500 mg  Status:  Discontinued        500 mg Oral Every evening 08/14/23 0856 08/19/23 1502   08/14/23 0915  ciprofloxacin (CIPRO) tablet 500 mg        500 mg Oral  Once 08/14/23 0856 08/14/23 1001    08/14/23 0615  Ampicillin-Sulbactam (UNASYN) 3 g in sodium chloride 0.9 % 100 mL IVPB  Status:  Discontinued        3 g 200 mL/hr over 30 Minutes Intravenous Every 12 hours 08/14/23 0528 08/16/23 1427              Family Communication/Anticipated D/C date and plan/Code Status   DVT prophylaxis: heparin injection 5,000 Units Start: 08/09/23 1000     Code Status: Full Code  Family Communication: None Disposition Plan: Plan to discharge to SNF   Status is: Observation The patient will require care spanning > 2 midnights and should be moved to inpatient because: No safe discharge plan       Subjective:   Interval events noted.  She complains of pain in the left shoulder from arthritis.  Yesterday, the pain was in the right shoulder.  No shortness of breath or chest pain.  Objective:    Vitals:   08/27/23 2004 08/28/23 0100 08/28/23 0437 08/28/23 0820  BP: (!) 154/92 (!) 173/101 (!) 152/95 (!) 148/94  Pulse: 81 79 81 86  Resp: 18 20 18 17   Temp: 97.9 F (36.6 C) 98 F (36.7 C) 98.7 F (37.1 C) 98.3 F (36.8 C)  TempSrc: Oral  Oral   SpO2: 99% 99% 100% 100%  Weight:      Height:       No data found.   Intake/Output Summary (Last 24 hours) at 08/28/2023 1054 Last data filed at 08/28/2023 1047 Gross per 24 hour  Intake 720 ml  Output 2000 ml  Net -1280 ml   Filed Weights   08/27/23 0501 08/27/23 1157 08/27/23 1551  Weight: 68 kg 65.6 kg 64.1 kg    Exam:   GEN: NAD SKIN: Warm and dry EYES: No pallor or icterus ENT: MMM CV: RRR PULM: CTA B ABD: soft, ND, NT, +BS CNS: AAO x 3, non focal EXT: No edema or tenderness      Data Reviewed:   I have personally reviewed following labs and imaging studies:  Labs: Labs show the following:   Basic Metabolic Panel: Recent Labs  Lab 08/22/23 0804 08/25/23 0811 08/27/23 1220  NA 133* 138 138  K 5.2* 6.2* 5.3*  CL 97* 103 98  CO2 26 23 26   GLUCOSE 73 85 95  BUN 95* 113* 97*  CREATININE  6.88* 8.57* 7.38*  CALCIUM 7.4* 7.6* 7.9*  PHOS 4.6 5.4* 4.7*   GFR Estimated Creatinine Clearance: 8 mL/min (A) (by C-G formula based on SCr of 7.38 mg/dL (H)). Liver Function Tests: Recent Labs  Lab 08/22/23 0804 08/25/23 0811 08/27/23 1220  ALBUMIN 3.1* 3.1* 3.3*   No results for input(s): "LIPASE", "AMYLASE" in  the last 168 hours. No results for input(s): "AMMONIA" in the last 168 hours. Coagulation profile No results for input(s): "INR", "PROTIME" in the last 168 hours.  CBC: Recent Labs  Lab 08/22/23 0804 08/25/23 0811 08/27/23 1220  WBC 9.5 8.8 8.2  HGB 7.7* 7.6* 7.7*  HCT 23.6* 22.8* 23.3*  MCV 84.0 84.8 82.3  PLT 254 245 214   Cardiac Enzymes: No results for input(s): "CKTOTAL", "CKMB", "CKMBINDEX", "TROPONINI" in the last 168 hours. BNP (last 3 results) No results for input(s): "PROBNP" in the last 8760 hours. CBG: No results for input(s): "GLUCAP" in the last 168 hours. D-Dimer: No results for input(s): "DDIMER" in the last 72 hours. Hgb A1c: No results for input(s): "HGBA1C" in the last 72 hours. Lipid Profile: No results for input(s): "CHOL", "HDL", "LDLCALC", "TRIG", "CHOLHDL", "LDLDIRECT" in the last 72 hours. Thyroid function studies: No results for input(s): "TSH", "T4TOTAL", "T3FREE", "THYROIDAB" in the last 72 hours.  Invalid input(s): "FREET3" Anemia work up: No results for input(s): "VITAMINB12", "FOLATE", "FERRITIN", "TIBC", "IRON", "RETICCTPCT" in the last 72 hours. Sepsis Labs: Recent Labs  Lab 08/22/23 0804 08/25/23 0811 08/27/23 1220  WBC 9.5 8.8 8.2    Microbiology No results found for this or any previous visit (from the past 240 hours).  Procedures and diagnostic studies:  No results found.             LOS: 0 days   Wyeth Hoffer  [facility]   Pager on [email or web address]. If 7PM-7AM, please contact night-coverage at [email or web address]     08/28/2023, 10:54 AM

## 2023-08-28 NOTE — TOC Progression Note (Addendum)
 Transition of Care [facility]) - Progression Note    Patient Details  Name: Allison Hill MRN: [id number] Date of Birth: [birth year]  Transition of Care [facility]) CM/SW Contact  Liliana Cline, LCSW Phone Number: 08/28/2023, 1:52 PM  Clinical Narrative:    CSW was notified by weekday CSW that patient has offers at [facility] (would need to private pay for 1 month), [facility] (would need to switch to [facility]), and [facility] (would need to switch dialysis closer to there). CSW spoke with [place] at [place] ([facility] and [facility]). She advised to attempt to get auth for patient to come under short term rehab then transition to LTC. She stated if patient was denied, she would have to private pay until the Medicaid kicked in at those facilities as well.  CSW met with patient at bedside and explained all of the above. Patient states she wants to try to get approved for STR. She states her first choice is [facility]. Patient states she feels she needs STR to get stronger before transitioning to LTC. Patient states she cannot afford to pay privately for LTC. Patient states her second choice of the current offers would be [facility].   CSW called Tammy at [facility] - she states patient could come under STR then transition to LTC if approved in [place]. CSW started [place] in [place].  4:20- [facility] Harvey is pending in [place].  Expected Discharge Plan:  (TBD) Barriers to Discharge: Continued Medical Work up  Expected Discharge Plan and Services     Post Acute Care Choice:  (TBD) Living arrangements for the past 2 months: Single Family Home                                       Social Determinants of Health (SDOH) Interventions SDOH Screenings   Food Insecurity: No Food Insecurity (08/10/2023)  Recent Concern: Food Insecurity - High Risk (07/17/2023)   Received from [facility]  Housing: Low Risk  (08/10/2023)  Recent Concern: Housing - High Risk (07/22/2023)   Received from [facility]  Transportation Needs:  Unmet Transportation Needs (08/10/2023)  Utilities: Not At Risk (08/10/2023)  Recent Concern: Utilities - Medium Risk (07/17/2023)   Received from [facility]  Financial Resource Strain: Patient Declined (01/03/2023)   Received from [facility]  Social Connections: Unknown (08/10/2023)  Tobacco Use: High Risk (08/10/2023)    Readmission Risk Interventions     No data to display

## 2023-08-29 DIAGNOSIS — I132 Hypertensive heart and chronic kidney disease with heart failure and with stage 5 chronic kidney disease, or end stage renal disease: Secondary | ICD-10-CM | POA: Diagnosis not present

## 2023-08-29 DIAGNOSIS — R0789 Other chest pain: Secondary | ICD-10-CM | POA: Diagnosis not present

## 2023-08-29 LAB — CBC
HCT: 25.8 % — ABNORMAL LOW (ref 36.0–46.0)
Hemoglobin: 8.3 g/dL — ABNORMAL LOW (ref 12.0–15.0)
MCH: 27.6 pg (ref 26.0–34.0)
MCHC: 32.2 g/dL (ref 30.0–36.0)
MCV: 85.7 fL (ref 80.0–100.0)
Platelets: 210 10*3/uL (ref 150–400)
RBC: 3.01 MIL/uL — ABNORMAL LOW (ref 3.87–5.11)
RDW: 18.6 % — ABNORMAL HIGH (ref 11.5–15.5)
WBC: 7.5 10*3/uL (ref 4.0–10.5)
nRBC: 0 % (ref 0.0–0.2)

## 2023-08-29 LAB — RENAL FUNCTION PANEL
Albumin: 3.4 g/dL — ABNORMAL LOW (ref 3.5–5.0)
Anion gap: 14 (ref 5–15)
BUN: 72 mg/dL — ABNORMAL HIGH (ref 6–20)
CO2: 26 mmol/L (ref 22–32)
Calcium: 8.4 mg/dL — ABNORMAL LOW (ref 8.9–10.3)
Chloride: 97 mmol/L — ABNORMAL LOW (ref 98–111)
Creatinine, Ser: 7 mg/dL — ABNORMAL HIGH (ref 0.44–1.00)
GFR, Estimated: 7 mL/min — ABNORMAL LOW (ref >60)
Glucose, Bld: 121 mg/dL — ABNORMAL HIGH (ref 70–99)
Phosphorus: 4.6 mg/dL (ref 2.5–4.6)
Potassium: 4.8 mmol/L (ref 3.5–5.1)
Sodium: 137 mmol/L (ref 135–145)

## 2023-08-29 MED ORDER — HEPARIN SODIUM (PORCINE) 1000 UNIT/ML IJ SOLN
INTRAMUSCULAR | Status: AC
  Filled 2023-08-29: qty 10

## 2023-08-29 MED ORDER — AMLODIPINE BESYLATE 5 MG PO TABS
5.0000 mg | ORAL_TABLET | Freq: Every evening | ORAL | Status: DC
  Administered 2023-08-29 – 2023-08-31 (×3): 5 mg via ORAL
  Filled 2023-08-29 (×3): qty 1

## 2023-08-29 MED ORDER — IRBESARTAN 150 MG PO TABS
300.0000 mg | ORAL_TABLET | Freq: Every evening | ORAL | Status: DC
  Administered 2023-08-29 – 2023-09-22 (×25): 300 mg via ORAL
  Filled 2023-08-29 (×25): qty 2

## 2023-08-29 NOTE — Progress Notes (Addendum)
 [facility]  ROUNDING NOTE   Subjective:   Patient seen and evaluated during dialysis   HEMODIALYSIS FLOWSHEET:  Blood Flow Rate (mL/min): 349 mL/min Arterial Pressure (mmHg): -183.43 mmHg Venous Pressure (mmHg): 146.25 mmHg TMP (mmHg): 23.23 mmHg Ultrafiltration Rate (mL/min): 1259 mL/min Dialysate Flow Rate (mL/min): 300 ml/min Dialysis Fluid Bolus: Normal Saline  Seated in chair Tolerating treatment well Blood pressure elevated.   Objective:  Vital signs in last 24 hours:  Temp:  [98 F (36.7 C)-99 F (37.2 C)] 98.4 F (36.9 C) (03/29 0933) Pulse Rate:  [72-87] 80 (03/29 1230) Resp:  [12-18] 15 (03/29 1230) BP: (135-181)/(84-107) 181/107 (03/29 1230) SpO2:  [98 %-100 %] 100 % (03/29 1230) Weight:  [65.5 kg] 65.5 kg (03/29 0933)  Weight change:  Filed Weights   08/27/23 1157 08/27/23 1551 08/29/23 0933  Weight: 65.6 kg 64.1 kg 65.5 kg    Intake/Output: I/O last 3 completed shifts: In: 720 [P.O.:720] Out: -    Intake/Output this shift:  No intake/output data recorded.  Physical Exam: General: NAD  Head: Normocephalic, atraumatic. Moist oral mucosal membranes  Eyes: Anicteric  Lungs:  Clear to auscultation  Heart: Regular rate and rhythm  Abdomen:  Soft, nontender  Extremities: No peripheral edema.  Neurologic: Alert, moving all four extremities  Skin: No lesions  Access: Rt Chest Permcath    Basic Metabolic Panel: Recent Labs  Lab 08/25/23 0811 08/27/23 1220 08/29/23 0948  NA 138 138 137  K 6.2* 5.3* 4.8  CL 103 98 97*  CO2 23 26 26   GLUCOSE 85 95 121*  BUN 113* 97* 72*  CREATININE 8.57* 7.38* 7.00*  CALCIUM 7.6* 7.9* 8.4*  PHOS 5.4* 4.7* 4.6    Liver Function Tests: Recent Labs  Lab 08/25/23 0811 08/27/23 1220 08/29/23 0948  ALBUMIN 3.1* 3.3* 3.4*    No results for input(s): "LIPASE", "AMYLASE" in the last 168 hours. No results for input(s): "AMMONIA" in the last 168 hours.  CBC: Recent Labs  Lab 08/25/23 0811  08/27/23 1220 08/29/23 0948  WBC 8.8 8.2 7.5  HGB 7.6* 7.7* 8.3*  HCT 22.8* 23.3* 25.8*  MCV 84.8 82.3 85.7  PLT 245 214 210    Cardiac Enzymes: No results for input(s): "CKTOTAL", "CKMB", "CKMBINDEX", "TROPONINI" in the last 168 hours.  BNP: Invalid input(s): "POCBNP"  CBG: No results for input(s): "GLUCAP" in the last 168 hours.  Microbiology: Results for orders placed or performed during the hospital encounter of 08/09/23  Resp panel by RT-PCR (RSV, Flu A&B, Covid) Anterior Nasal Swab     Status: None   Collection Time: 08/14/23  3:44 AM   Specimen: Anterior Nasal Swab  Result Value Ref Range Status   SARS Coronavirus 2 by RT PCR NEGATIVE NEGATIVE Final    Comment: (NOTE) SARS-CoV-2 target nucleic acids are NOT DETECTED.  The SARS-CoV-2 RNA is generally detectable in upper respiratory specimens during the acute phase of infection. The lowest concentration of SARS-CoV-2 viral copies this assay can detect is 138 copies/mL. A negative result does not preclude SARS-Cov-2 infection and should not be used as the sole basis for treatment or other patient management decisions. A negative result may occur with  improper specimen collection/handling, submission of specimen other than nasopharyngeal swab, presence of viral mutation(s) within the areas targeted by this assay, and inadequate number of viral copies(<138 copies/mL). A negative result must be combined with clinical observations, patient history, and epidemiological information. The expected result is Negative.  Fact Sheet for Patients:  BloggerCourse.com  Fact Sheet for Healthcare Providers:  SeriousBroker.it  This test is no t yet approved or cleared by the [place] FDA and  has been authorized for detection and/or diagnosis of SARS-CoV-2 by FDA under an Emergency Use Authorization (EUA). This EUA will remain  in effect (meaning this test can be used) for  the duration of the COVID-19 declaration under Section 564(b)(1) of the Act, 21 U.S.C.section 360bbb-3(b)(1), unless the authorization is terminated  or revoked sooner.       Influenza A by PCR NEGATIVE NEGATIVE Final   Influenza B by PCR NEGATIVE NEGATIVE Final    Comment: (NOTE) The Xpert Xpress SARS-CoV-2/FLU/RSV plus assay is intended as an aid in the diagnosis of influenza from Nasopharyngeal swab specimens and should not be used as a sole basis for treatment. Nasal washings and aspirates are unacceptable for Xpert Xpress SARS-CoV-2/FLU/RSV testing.  Fact Sheet for Patients: BloggerCourse.com  Fact Sheet for Healthcare Providers: SeriousBroker.it  This test is not yet approved or cleared by the [place] FDA and has been authorized for detection and/or diagnosis of SARS-CoV-2 by FDA under an Emergency Use Authorization (EUA). This EUA will remain in effect (meaning this test can be used) for the duration of the COVID-19 declaration under Section 564(b)(1) of the Act, 21 U.S.C. section 360bbb-3(b)(1), unless the authorization is terminated or revoked.     Resp Syncytial Virus by PCR NEGATIVE NEGATIVE Final    Comment: (NOTE) Fact Sheet for Patients: BloggerCourse.com  Fact Sheet for Healthcare Providers: SeriousBroker.it  This test is not yet approved or cleared by the [place] FDA and has been authorized for detection and/or diagnosis of SARS-CoV-2 by FDA under an Emergency Use Authorization (EUA). This EUA will remain in effect (meaning this test can be used) for the duration of the COVID-19 declaration under Section 564(b)(1) of the Act, 21 U.S.C. section 360bbb-3(b)(1), unless the authorization is terminated or revoked.  Performed at [facility], [address] [place], [place] [address]   Respiratory (~20 pathogens) panel by PCR     Status:  None   Collection Time: 08/14/23  3:44 AM   Specimen: Nasopharyngeal Swab; Respiratory  Result Value Ref Range Status   Adenovirus NOT DETECTED NOT DETECTED Final   Coronavirus 229E NOT DETECTED NOT DETECTED Final    Comment: (NOTE) The Coronavirus on the Respiratory Panel, DOES NOT test for the novel  Coronavirus (2019 nCoV)    Coronavirus HKU1 NOT DETECTED NOT DETECTED Final   Coronavirus NL63 NOT DETECTED NOT DETECTED Final   Coronavirus OC43 NOT DETECTED NOT DETECTED Final   Metapneumovirus NOT DETECTED NOT DETECTED Final   Rhinovirus / Enterovirus NOT DETECTED NOT DETECTED Final   Influenza A NOT DETECTED NOT DETECTED Final   Influenza B NOT DETECTED NOT DETECTED Final   Parainfluenza Virus 1 NOT DETECTED NOT DETECTED Final   Parainfluenza Virus 2 NOT DETECTED NOT DETECTED Final   Parainfluenza Virus 3 NOT DETECTED NOT DETECTED Final   Parainfluenza Virus 4 NOT DETECTED NOT DETECTED Final   Respiratory Syncytial Virus NOT DETECTED NOT DETECTED Final   Bordetella pertussis NOT DETECTED NOT DETECTED Final   Bordetella Parapertussis NOT DETECTED NOT DETECTED Final   Chlamydophila pneumoniae NOT DETECTED NOT DETECTED Final   Mycoplasma pneumoniae NOT DETECTED NOT DETECTED Final    Comment: Performed at [facility], [address] [place], [place] [address]  Culture, blood (Routine X 2) w Reflex to ID Panel     Status: None   Collection  Time: 08/14/23  9:19 AM   Specimen: BLOOD  Result Value Ref Range Status   Specimen Description BLOOD BLOOD RIGHT HAND  Final   Special Requests   Final    BOTTLES DRAWN AEROBIC AND ANAEROBIC Blood Culture adequate volume   Culture   Final    NO GROWTH 5 DAYS Performed at [facility], [address] [place], [place] [address]    Report Status 08/19/2023 FINAL  Final  Culture, blood (Routine X 2) w Reflex to ID Panel     Status: None   Collection Time: 08/14/23  9:19 AM   Specimen: BLOOD  Result Value Ref Range Status    Specimen Description BLOOD BLOOD LEFT HAND  Final   Special Requests   Final    BOTTLES DRAWN AEROBIC AND ANAEROBIC Blood Culture adequate volume   Culture   Final    NO GROWTH 5 DAYS Performed at [facility], [address] [place], [place] [address]    Report Status 08/19/2023 FINAL  Final  Ear culture     Status: None   Collection Time: 08/14/23 10:11 AM   Specimen: Ear; Other  Result Value Ref Range Status   Specimen Description   Final    EAR Performed at [facility], [address] [place], [place] [address]    Special Requests   Final    RIGHT EAR Performed at [facility], [address] [place], [place] [address]    Culture RARE STREPTOCOCCUS PNEUMONIAE  Final   Report Status 08/16/2023 FINAL  Final   Organism ID, Bacteria STREPTOCOCCUS PNEUMONIAE  Final      Susceptibility   Streptococcus pneumoniae - MIC*    ERYTHROMYCIN <=0.12 SENSITIVE Sensitive     LEVOFLOXACIN 1 SENSITIVE Sensitive     VANCOMYCIN 0.5 SENSITIVE Sensitive     PENICILLIN (meningitis) <=0.06 SENSITIVE Sensitive     PENO - penicillin <=0.06      PENICILLIN (non-meningitis) <=0.06 SENSITIVE Sensitive     PENICILLIN (oral) <=0.06 SENSITIVE Sensitive     CEFTRIAXONE (non-meningitis) <=0.12 SENSITIVE Sensitive     CEFTRIAXONE (meningitis) <=0.12 SENSITIVE Sensitive     * RARE STREPTOCOCCUS PNEUMONIAE    Coagulation Studies: No results for input(s): "LABPROT", "INR" in the last 72 hours.  Urinalysis: No results for input(s): "COLORURINE", "LABSPEC", "PHURINE", "GLUCOSEU", "HGBUR", "BILIRUBINUR", "KETONESUR", "PROTEINUR", "UROBILINOGEN", "NITRITE", "LEUKOCYTESUR" in the last 72 hours.  Invalid input(s): "APPERANCEUR"    Imaging: No results found.    Medications:     aspirin EC  81 mg Oral Daily   atorvastatin  40 mg Oral Daily   bisacodyl  10 mg Rectal Once   calcium carbonate  400 mg of elemental calcium Oral BID   Chlorhexidine Gluconate Cloth  6 each  Topical Q0600   cinacalcet  30 mg Oral Q supper   epoetin alfa  10,000 Units Intravenous Q T,Th,Sa-HD   fluticasone  1 spray Each Nare Daily   gabapentin  200 mg Oral BID   heparin  5,000 Units Subcutaneous Q12H   hydrALAZINE  100 mg Oral TID   hydrOXYzine  25 mg Oral TID   isosorbide mononitrate  120 mg Oral Daily   labetalol  600 mg Oral BID   losartan  100 mg Oral Daily   pantoprazole  40 mg Oral Daily   senna-docusate  2 tablet Oral BID   sevelamer carbonate  1,600 mg Oral TID with meals   terazosin  2 mg Oral BID  guaiFENesin-dextromethorphan, ondansetron (ZOFRAN) IV, oxyCODONE  Assessment/ Plan:  [name] is a 53 y.o.  female  with past medical conditions including hypertension, CHF, anemia, and end-stage renal disease on hemodialysis, who was admitted to [facility] on 08/09/2023 for Hypertensive urgency [I16.0] Chest pain [R07.9] Nonspecific chest pain [R07.9] Anemia due to chronic kidney disease, on chronic dialysis (HCC) [N18.6, D63.1, Z99.2]  Outpatient dialysis clinic has been arranged at [facility] on a TTS schedule, chair time at 530 am.     1.  End stage renal disease on hemodialysis.  Receiving dialysis today, UF goal 2L as tolerated. Next treatment scheduled for Tuesday.     2. Anemia of chronic kidney disease Hemoglobin & Hematocrit     Component Value Date/Time   HGB 8.3 (L) 08/29/2023 0948   HCT 25.8 (L) 08/29/2023 0948  Hemoglobin improved.  Will continue IV EPO 10,000 units with  dialysis treatment.   3. Hypertensive urgency, on admission   Blood pressure 179/101 during dialysis. Has not received am antihypertensives.    4. Secondary Hyperparathyroidism: with outpatient labs: None available Will continue to monitor bone minerals.  Currently prescribed calcium carbonate, sevelamer and Cinacalcet.    LOS: 0 Allison Hill 3/29/202512:51 PM  Patient was seen and examined with Wendee Beavers, NP.  Plan of care was formulated for the  problems addressed and discussed with NP.  I agree with the note as documented except as noted below.   Patient seen during dialysis today.  Tolerating well.  Continue TTS schedule. Blood pressure controls labile.  Patient is on multiple antihypertensives. We will change Cozaar to irbesartan -long-acting ARB. Amlodipine was discontinued on 321.  Blood pressure was running low in the 90-100 range at that time. We will restart amlodipine at a lower dose

## 2023-08-29 NOTE — Plan of Care (Signed)

## 2023-08-29 NOTE — Progress Notes (Signed)
 Progress Note    [name]  EAV:[id number] DOB: 08/29/1970  DOA: 08/09/2023 PCP: Melvenia Beam, MD      Brief Narrative:    Medical records reviewed and are as summarized below:  Allison Hill is a 53 y.o. female with medical history significant of ESRD on HD MWF, refractory HTN, chronic HFpEF, chronic iron deficiency anemia admitted to hospitalist service for evaluation of chest pain, hypertensive urgency.  Patient's chest pain atypical, troponins negative.  Blood pressure improved with home regimen.  Patient lives with her daughter who does not want to take her home given high risk for falls, not safe for her HD transportation.  HD outpatient facility arranged.  TOC working on placement   On 08/13/2023/08/14/2023 patient is complaining of severe pain in her right ear that is not controlled by tylenol. The pain has not improved with IV Unasyn. CT head demonstrated mastoiditis and otitis media with swelling of the external canal. The patient was given IV Fentanyl for pain and antibiotics were expanded to include cipro PO. She will undergo dialysis on Saturday as part of her usual schedule.  PT/OT recommended nursing home placement, however insurance company denied the application.      Assessment/Plan:   Principal Problem:   Chest pain Active Problems:   Otitis media not resolved, right   Hypertensive urgency   ESRD on dialysis (HCC)   Right upper quadrant abdominal pain    Body mass index is 24.95 kg/m.   Right otitis media Resolved. Completed 7 days of antibiotics on 08/20/2023 (including IV Unasyn followed by ciprofloxacin and Levaquin).    Hypertensive urgency Orthostatic hypotension. BP is elevated. continue to monitor. Continue labetalol, losartan, terazosin and hydralazine. Consider restarting amlodipine if BP remains persistently elevated    ESRD on dialysis (HCC) Hyperkalemia Hypocalcemia Anemia of chronic kidney disease Plan for  hemodialysis today Continue Epogen, Cinacalcet, sevelamer and calcium carbonate Follow-up with nephrologist for hemodialysis    Chest pain Resolved    Right upper quadrant abdominal pain Improved.  Continue Protonix US of the RUQ of the abdomen is negative for cholecystitis or cholelithiasis.      General weakness No safe discharge plan.  Awaiting placement to long-term care facility Follow-up with transition of care team to assist with disposition   Analgesics as needed for shoulder osteoarthritis   Awaiting placement to SNF   Diet Order             Diet Heart Room service appropriate? Yes; Fluid consistency: Thin  Diet effective now                            Consultants: Nephrologist  Procedures: None    Medications:    amLODipine  5 mg Oral QPM   aspirin EC  81 mg Oral Daily   atorvastatin  40 mg Oral Daily   bisacodyl  10 mg Rectal Once   calcium carbonate  400 mg of elemental calcium Oral BID   Chlorhexidine Gluconate Cloth  6 each Topical Q0600   cinacalcet  30 mg Oral Q supper   epoetin alfa  10,000 Units Intravenous Q T,Th,Sa-HD   fluticasone  1 spray Each Nare Daily   gabapentin  200 mg Oral BID   heparin  5,000 Units Subcutaneous Q12H   hydrALAZINE  100 mg Oral TID   hydrOXYzine  25 mg Oral TID   irbesartan  300 mg Oral QPM   isosorbide mononitrate  120 mg Oral Daily   labetalol  600 mg Oral BID   pantoprazole  40 mg Oral Daily   senna-docusate  2 tablet Oral BID   sevelamer carbonate  1,600 mg Oral TID with meals   terazosin  2 mg Oral BID   Continuous Infusions:   Anti-infectives (From admission, onward)    Start     Dose/Rate Route Frequency Ordered Stop   08/19/23 1800  levofloxacin (LEVAQUIN) tablet 250 mg        250 mg Oral Every evening 08/19/23 1504 08/20/23 1739   08/15/23 1800  ciprofloxacin (CIPRO) tablet 500 mg  Status:  Discontinued        500 mg Oral Every evening 08/14/23 0856 08/19/23 1502   08/14/23  0915  ciprofloxacin (CIPRO) tablet 500 mg        500 mg Oral  Once 08/14/23 0856 08/14/23 1001   08/14/23 0615  Ampicillin-Sulbactam (UNASYN) 3 g in sodium chloride 0.9 % 100 mL IVPB  Status:  Discontinued        3 g 200 mL/hr over 30 Minutes Intravenous Every 12 hours 08/14/23 0528 08/16/23 1427              Family Communication/Anticipated D/C date and plan/Code Status   DVT prophylaxis: heparin injection 5,000 Units Start: 08/09/23 1000     Code Status: Full Code  Family Communication: None Disposition Plan: Plan to discharge to SNF   Status is: Observation The patient will require care spanning > 2 midnights and should be moved to inpatient because: No safe discharge plan       Subjective:   Interval events noted.  She complains of fatigue.  She said this is because she just came back from dialysis.  No other complaints.  Objective:    Vitals:   08/29/23 1200 08/29/23 1230 08/29/23 1300 08/29/23 1329  BP: (!) 179/101 (!) 181/107 (!) 156/94 (!) 184/87  Pulse: 72 80 79 81  Resp: 15 15 17  (!) 21  Temp:    98.3 F (36.8 C)  TempSrc:    Oral  SpO2: 100% 100% 100% 100%  Weight:    63.9 kg  Height:       No data found.   Intake/Output Summary (Last 24 hours) at 08/29/2023 1553 Last data filed at 08/29/2023 1329 Gross per 24 hour  Intake 240 ml  Output 1100 ml  Net -860 ml   Filed Weights   08/27/23 1551 08/29/23 0933 08/29/23 1329  Weight: 64.1 kg 65.5 kg 63.9 kg    Exam:   GEN: NAD SKIN: Warm and dry EYES: No pallor or icterus ENT: MMM CV: RRR PULM: CTA B ABD: soft, ND, NT, +BS CNS: AAO x 3, non focal EXT: No edema or tenderness       Data Reviewed:   I have personally reviewed following labs and imaging studies:  Labs: Labs show the following:   Basic Metabolic Panel: Recent Labs  Lab 08/25/23 0811 08/27/23 1220 08/29/23 0948  NA 138 138 137  K 6.2* 5.3* 4.8  CL 103 98 97*  CO2 23 26 26   GLUCOSE 85 95 121*  BUN 113*  97* 72*  CREATININE 8.57* 7.38* 7.00*  CALCIUM 7.6* 7.9* 8.4*  PHOS 5.4* 4.7* 4.6   GFR Estimated Creatinine Clearance: 8.5 mL/min (A) (by C-G formula based on SCr of 7 mg/dL (H)). Liver Function Tests: Recent Labs  Lab 08/25/23 0811 08/27/23 1220 08/29/23 0948  ALBUMIN 3.1* 3.3* 3.4*   No  results for input(s): "LIPASE", "AMYLASE" in the last 168 hours. No results for input(s): "AMMONIA" in the last 168 hours. Coagulation profile No results for input(s): "INR", "PROTIME" in the last 168 hours.  CBC: Recent Labs  Lab 08/25/23 0811 08/27/23 1220 08/29/23 0948  WBC 8.8 8.2 7.5  HGB 7.6* 7.7* 8.3*  HCT 22.8* 23.3* 25.8*  MCV 84.8 82.3 85.7  PLT 245 214 210   Cardiac Enzymes: No results for input(s): "CKTOTAL", "CKMB", "CKMBINDEX", "TROPONINI" in the last 168 hours. BNP (last 3 results) No results for input(s): "PROBNP" in the last 8760 hours. CBG: No results for input(s): "GLUCAP" in the last 168 hours. D-Dimer: No results for input(s): "DDIMER" in the last 72 hours. Hgb A1c: No results for input(s): "HGBA1C" in the last 72 hours. Lipid Profile: No results for input(s): "CHOL", "HDL", "LDLCALC", "TRIG", "CHOLHDL", "LDLDIRECT" in the last 72 hours. Thyroid function studies: No results for input(s): "TSH", "T4TOTAL", "T3FREE", "THYROIDAB" in the last 72 hours.  Invalid input(s): "FREET3" Anemia work up: No results for input(s): "VITAMINB12", "FOLATE", "FERRITIN", "TIBC", "IRON", "RETICCTPCT" in the last 72 hours. Sepsis Labs: Recent Labs  Lab 08/25/23 0811 08/27/23 1220 08/29/23 0948  WBC 8.8 8.2 7.5    Microbiology No results found for this or any previous visit (from the past 240 hours).  Procedures and diagnostic studies:  No results found.             LOS: 0 days   Wasil Wolke  [facility]   Pager on [email or web address]. If 7PM-7AM, please contact night-coverage at [email or web address]     08/29/2023, 3:53 PM

## 2023-08-29 NOTE — TOC Progression Note (Signed)
 Transition of Care [facility]) - Progression Note    Patient Details  Name: Allison Hill MRN: [id number] Date of Birth: Sep 19, 1970  [facility]) CM/SW Contact  Liliana Cline, LCSW Phone Number: 08/29/2023, 9:33 AM  Clinical Narrative:    [facility]erkley Harvey pending.   Expected Discharge Plan:  (TBD) Barriers to Discharge: Continued Medical Work up  Expected Discharge Plan and Services     Post Acute Care Choice:  (TBD) Living arrangements for the past 2 months: Single Family Home                                       Social Determinants of Health (SDOH) Interventions SDOH Screenings   Food Insecurity: No Food Insecurity (08/10/2023)  Recent Concern: Food Insecurity - High Risk (07/17/2023)   Received from [facility]  Housing: Low Risk  (08/10/2023)  Recent Concern: Housing - High Risk (07/22/2023)   Received from [facility]  Transportation Needs: Unmet Transportation Needs (08/10/2023)  Utilities: Not At Risk (08/10/2023)  Recent Concern: Utilities - Medium Risk (07/17/2023)   Received from [facility]  Financial Resource Strain: Patient Declined (01/03/2023)   Received from [facility]  Social Connections: Unknown (08/10/2023)  Tobacco Use: High Risk (08/10/2023)    Readmission Risk Interventions     No data to display

## 2023-08-29 NOTE — Progress Notes (Signed)
 Hemodialysis Note:  Received patient in bed to unit. Alert and oriented. Informed consent singed and in chart.  Treatment initiated: 0947 Treatment completed: 1329  Access used: Right Subclavian catheter Access issues: Rinse back 30 minutes early due to system clot.  Transported back to room, alert without acute distress. Report given to patient's RN.  Total UF removed: 1.1 Liter Medications given: Epogen 10000 units IV  Post HD weight: 63.9 Kg  [facility]

## 2023-08-30 DIAGNOSIS — I132 Hypertensive heart and chronic kidney disease with heart failure and with stage 5 chronic kidney disease, or end stage renal disease: Secondary | ICD-10-CM | POA: Diagnosis not present

## 2023-08-30 DIAGNOSIS — R0789 Other chest pain: Secondary | ICD-10-CM | POA: Diagnosis not present

## 2023-08-30 NOTE — Plan of Care (Signed)
  Problem: Education: Goal: Individualized Educational Video(s) Outcome: Progressing   Problem: Activity: Goal: Ability to tolerate increased activity will improve Outcome: Progressing   Problem: Cardiac: Goal: Ability to achieve and maintain adequate cardiovascular perfusion will improve Outcome: Progressing   Problem: Health Behavior/Discharge Planning: Goal: Ability to safely manage health-related needs after discharge will improve Outcome: Progressing

## 2023-08-30 NOTE — TOC Progression Note (Addendum)
 Transition of Care [facility]) - Progression Note    Patient Details  Name: Allison Hill MRN: [id number] Date of Birth: [birth year]  Transition of Care [facility]) CM/SW Contact  Liliana Cline, LCSW Phone Number: 08/30/2023, 9:14 AM  Clinical Narrative:    Berkley Harvey is pending in [place]. Updated the patient. Per chart review - has a HD chair time at [facility].  3:53- Berkley Harvey is still pending.  Expected Discharge Plan:  (TBD) Barriers to Discharge: Continued Medical Work up  Expected Discharge Plan and Services     Post Acute Care Choice:  (TBD) Living arrangements for the past 2 months: Single Family Home                                       Social Determinants of Health (SDOH) Interventions SDOH Screenings   Food Insecurity: No Food Insecurity (08/10/2023)  Recent Concern: Food Insecurity - High Risk (07/17/2023)   Received from [facility]  Housing: Low Risk  (08/10/2023)  Recent Concern: Housing - High Risk (07/22/2023)   Received from [facility]  Transportation Needs: Unmet Transportation Needs (08/10/2023)  Utilities: Not At Risk (08/10/2023)  Recent Concern: Utilities - Medium Risk (07/17/2023)   Received from [facility]  Financial Resource Strain: Patient Declined (01/03/2023)   Received from [facility]  Social Connections: Unknown (08/10/2023)  Tobacco Use: High Risk (08/10/2023)    Readmission Risk Interventions     No data to display

## 2023-08-30 NOTE — Progress Notes (Signed)
 Progress Note    Allison Hill  VOZ:[id number] DOB: 1970/06/15  DOA: 08/09/2023 PCP: Melvenia Beam, MD      Brief Narrative:    Medical records reviewed and are as summarized below:  Allison Hill is a 53 y.o. female with medical history significant of ESRD on HD MWF, refractory HTN, chronic HFpEF, chronic iron deficiency anemia admitted to hospitalist service for evaluation of chest pain, hypertensive urgency.  Patient's chest pain atypical, troponins negative.  Blood pressure improved with home regimen.  Patient lives with her daughter who does not want to take her home given high risk for falls, not safe for her HD transportation.  HD outpatient facility arranged.  TOC working on placement   On 08/13/2023/08/14/2023 patient is complaining of severe pain in her right ear that is not controlled by tylenol. The pain has not improved with IV Unasyn. CT head demonstrated mastoiditis and otitis media with swelling of the external canal. The patient was given IV Fentanyl for pain and antibiotics were expanded to include cipro PO. She will undergo dialysis on Saturday as part of her usual schedule.  PT/OT recommended nursing home placement, however insurance company denied the application.      Assessment/Plan:   Principal Problem:   Chest pain Active Problems:   Otitis media not resolved, right   Hypertensive urgency   ESRD on dialysis (HCC)   Right upper quadrant abdominal pain    Body mass index is 24.95 kg/m.   Right otitis media Resolved. Completed 7 days of antibiotics on 08/20/2023 (including IV Unasyn followed by ciprofloxacin and Levaquin).    Hypertensive urgency Orthostatic hypotension. BP is better.  Continue antihypertensives. Continue labetalol, losartan, terazosin and hydralazine.    ESRD on dialysis (HCC) Hyperkalemia Hypocalcemia Anemia of chronic kidney disease Plan for hemodialysis today Continue Epogen, Cinacalcet, sevelamer and  calcium carbonate Follow-up with nephrologist for hemodialysis    Chest pain Resolved    Right upper quadrant abdominal pain Improved.  Continue Protonix US of the RUQ of the abdomen is negative for cholecystitis or cholelithiasis.      General weakness No safe discharge plan.  Awaiting placement to long-term care facility Follow-up with transition of care team to assist with disposition   Analgesics as needed for shoulder osteoarthritis   Awaiting placement to SNF   Diet Order             Diet Heart Room service appropriate? Yes; Fluid consistency: Thin  Diet effective now                            Consultants: Nephrologist  Procedures: None    Medications:    amLODipine  5 mg Oral QPM   aspirin EC  81 mg Oral Daily   atorvastatin  40 mg Oral Daily   bisacodyl  10 mg Rectal Once   calcium carbonate  400 mg of elemental calcium Oral BID   Chlorhexidine Gluconate Cloth  6 each Topical Q0600   cinacalcet  30 mg Oral Q supper   epoetin alfa  10,000 Units Intravenous Q T,Th,Sa-HD   fluticasone  1 spray Each Nare Daily   gabapentin  200 mg Oral BID   heparin  5,000 Units Subcutaneous Q12H   hydrALAZINE  100 mg Oral TID   hydrOXYzine  25 mg Oral TID   irbesartan  300 mg Oral QPM   isosorbide mononitrate  120 mg Oral Daily   labetalol  600 mg Oral BID   pantoprazole  40 mg Oral Daily   senna-docusate  2 tablet Oral BID   sevelamer carbonate  1,600 mg Oral TID with meals   terazosin  2 mg Oral BID   Continuous Infusions:   Anti-infectives (From admission, onward)    Start     Dose/Rate Route Frequency Ordered Stop   08/19/23 1800  levofloxacin (LEVAQUIN) tablet 250 mg        250 mg Oral Every evening 08/19/23 1504 08/20/23 1739   08/15/23 1800  ciprofloxacin (CIPRO) tablet 500 mg  Status:  Discontinued        500 mg Oral Every evening 08/14/23 0856 08/19/23 1502   08/14/23 0915  ciprofloxacin (CIPRO) tablet 500 mg        500 mg Oral   Once 08/14/23 0856 08/14/23 1001   08/14/23 0615  Ampicillin-Sulbactam (UNASYN) 3 g in sodium chloride 0.9 % 100 mL IVPB  Status:  Discontinued        3 g 200 mL/hr over 30 Minutes Intravenous Every 12 hours 08/14/23 0528 08/16/23 1427              Family Communication/Anticipated D/C date and plan/Code Status   DVT prophylaxis: heparin injection 5,000 Units Start: 08/09/23 1000     Code Status: Full Code  Family Communication: None Disposition Plan: Plan to discharge to SNF   Status is: Observation The patient will require care spanning > 2 midnights and should be moved to inpatient because: No safe discharge plan       Subjective:   Interval events noted.  She has no complaints.  Objective:    Vitals:   08/29/23 1649 08/29/23 2042 08/30/23 0436 08/30/23 0921  BP: 119/77 127/80 (!) 150/91 138/77  Pulse: 95 88 75 98  Resp: 16 17 16 14   Temp: 98 F (36.7 C) 98.4 F (36.9 C) 98.4 F (36.9 C) 98.3 F (36.8 C)  TempSrc: Oral Oral Oral   SpO2: 100% 98% 98% 97%  Weight:      Height:       No data found.   Intake/Output Summary (Last 24 hours) at 08/30/2023 1236 Last data filed at 08/30/2023 0900 Gross per 24 hour  Intake 240 ml  Output 1100 ml  Net -860 ml   Filed Weights   08/27/23 1551 08/29/23 0933 08/29/23 1329  Weight: 64.1 kg 65.5 kg 63.9 kg    Exam:   GEN: NAD SKIN: Warm and dry EYES: No pallor or icterus ENT: MMM CV: RRR PULM: CTA B ABD: soft, ND, NT, +BS CNS: AAO x 3, non focal EXT: No edema or tenderness     Data Reviewed:   I have personally reviewed following labs and imaging studies:  Labs: Labs show the following:   Basic Metabolic Panel: Recent Labs  Lab 08/25/23 0811 08/27/23 1220 08/29/23 0948  NA 138 138 137  K 6.2* 5.3* 4.8  CL 103 98 97*  CO2 23 26 26   GLUCOSE 85 95 121*  BUN 113* 97* 72*  CREATININE 8.57* 7.38* 7.00*  CALCIUM 7.6* 7.9* 8.4*  PHOS 5.4* 4.7* 4.6   GFR Estimated Creatinine  Clearance: 8.5 mL/min (A) (by C-G formula based on SCr of 7 mg/dL (H)). Liver Function Tests: Recent Labs  Lab 08/25/23 0811 08/27/23 1220 08/29/23 0948  ALBUMIN 3.1* 3.3* 3.4*   No results for input(s): "LIPASE", "AMYLASE" in the last 168 hours. No results for input(s): "AMMONIA" in the last 168 hours. Coagulation profile  No results for input(s): "INR", "PROTIME" in the last 168 hours.  CBC: Recent Labs  Lab 08/25/23 0811 08/27/23 1220 08/29/23 0948  WBC 8.8 8.2 7.5  HGB 7.6* 7.7* 8.3*  HCT 22.8* 23.3* 25.8*  MCV 84.8 82.3 85.7  PLT 245 214 210   Cardiac Enzymes: No results for input(s): "CKTOTAL", "CKMB", "CKMBINDEX", "TROPONINI" in the last 168 hours. BNP (last 3 results) No results for input(s): "PROBNP" in the last 8760 hours. CBG: No results for input(s): "GLUCAP" in the last 168 hours. D-Dimer: No results for input(s): "DDIMER" in the last 72 hours. Hgb A1c: No results for input(s): "HGBA1C" in the last 72 hours. Lipid Profile: No results for input(s): "CHOL", "HDL", "LDLCALC", "TRIG", "CHOLHDL", "LDLDIRECT" in the last 72 hours. Thyroid function studies: No results for input(s): "TSH", "T4TOTAL", "T3FREE", "THYROIDAB" in the last 72 hours.  Invalid input(s): "FREET3" Anemia work up: No results for input(s): "VITAMINB12", "FOLATE", "FERRITIN", "TIBC", "IRON", "RETICCTPCT" in the last 72 hours. Sepsis Labs: Recent Labs  Lab 08/25/23 0811 08/27/23 1220 08/29/23 0948  WBC 8.8 8.2 7.5    Microbiology No results found for this or any previous visit (from the past 240 hours).  Procedures and diagnostic studies:  No results found.             LOS: 0 days   Anival Pasha  [facility]   Pager on [email or web address]. If 7PM-7AM, please contact night-coverage at [email or web address]     08/30/2023, 12:36 PM

## 2023-08-31 DIAGNOSIS — R0789 Other chest pain: Secondary | ICD-10-CM | POA: Diagnosis not present

## 2023-08-31 DIAGNOSIS — I132 Hypertensive heart and chronic kidney disease with heart failure and with stage 5 chronic kidney disease, or end stage renal disease: Secondary | ICD-10-CM | POA: Diagnosis not present

## 2023-08-31 NOTE — Plan of Care (Signed)

## 2023-08-31 NOTE — Plan of Care (Signed)
  Problem: Activity: Goal: Ability to tolerate increased activity will improve Outcome: Progressing   Problem: Activity: Goal: Risk for activity intolerance will decrease Outcome: Progressing

## 2023-08-31 NOTE — Progress Notes (Signed)
 Occupational Therapy Treatment Patient Details Name: Allison Hill MRN: [id number] DOB: Feb 19, 1971 Today's Date: 08/31/2023   History of present illness Pt is a 53 y.o. female with medical history significant of ESRD on HD MWF, refractory HTN, chronic HFpEF, chronic iron deficiency anemia presented with new onset of chest pain, workup for hypertensive emergency.   OT comments  Pt seen for OT tx on 2nd attempt. First attempt nursing with pt. Pt sleeping, wakes and agreeable despite 10/10 L shoulder pain. Premedicated before session. Pt completed all bed mobility with modified independence, including pushing herself up in long sitting with BUE. Sat EOB to don socks and shoes reaching down to the floor with LUE and able to use figure 4 technique. Pt required SBA-CGA for ADL transfers and mobility both in the room and into the hallway with rollator with intermittent VC for rollator safety in order to negotiate obstacles safely without bumping into them with rollator. While turning to the L, pt get L foot caught outside of the rollator requiring VC and TC on rollator to correct. Pt continues to benefit from skilled OT services.       If plan is discharge home, recommend the following:  A little help with walking and/or transfers;A little help with bathing/dressing/bathroom;Assistance with cooking/housework;Assistance with feeding;Direct supervision/assist for medications management;Direct supervision/assist for financial management;Assist for transportation;Help with stairs or ramp for entrance;Supervision due to cognitive status   Equipment Recommendations  None recommended by OT    Recommendations for Other Services      Precautions / Restrictions Precautions Precautions: Fall Recall of Precautions/Restrictions: Impaired Restrictions Weight Bearing Restrictions Per Provider Order: No       Mobility Bed Mobility Overal bed mobility: Modified Independent                   Transfers Overall transfer level: Needs assistance Equipment used: Rollator (4 wheels) Transfers: Sit to/from Stand Sit to Stand: Supervision           General transfer comment: does not lock rollator breaks prior to standing/sitting despite VC     Balance Overall balance assessment: Needs assistance Sitting-balance support: Feet supported Sitting balance-Leahy Scale: Good     Standing balance support: Bilateral upper extremity supported, Reliant on assistive device for balance, During functional activity Standing balance-Leahy Scale: Fair Standing balance comment: wide BOS, tends to get L foot caught outside of the rollator when turning to the left                           ADL either performed or assessed with clinical judgement   ADL Overall ADL's : Needs assistance/impaired                     Lower Body Dressing: Sitting/lateral leans;Modified independent Lower Body Dressing Details (indicate cue type and reason): donned socks/shoes EOB             Functional mobility during ADLs: Contact guard assist;Rollator (4 wheels)      Extremity/Trunk Assessment              Vision       Perception     Praxis     Communication Communication Communication: Impaired Factors Affecting Communication: Difficulty expressing self   Cognition Arousal: Alert Behavior During Therapy: WFL for tasks assessed/performed Cognition: No family/caregiver present to determine baseline             OT - Cognition Comments: slow processing,  VC for safety/rollator use                   Following commands impaired: Follows multi-step commands inconsistently, Follows one step commands with increased time      Cueing      Exercises Other Exercises Other Exercises: Pt completed in-room and hallway mobility with rollator requiring SBA-CGA and intermittent VC for rollator safety and to negotiate obstacles. Pt nearly bumped into several obstacles  requiring VC to adjust rollator    Shoulder Instructions       General Comments      Pertinent Vitals/ Pain       Pain Assessment Pain Assessment: 0-10 Pain Score: 10-Worst pain ever Pain Location: L shoulder Pain Descriptors / Indicators: Aching Pain Intervention(s): Limited activity within patient's tolerance, Monitored during session, Premedicated before session, Repositioned  Home Living                                          Prior Functioning/Environment              Frequency  Min 2X/week        Progress Toward Goals  OT Goals(current goals can now be found in the care plan section)  Progress towards OT goals: Progressing toward goals  Acute Rehab OT Goals OT Goal Formulation: With patient Time For Goal Achievement: 09/08/23 Potential to Achieve Goals: Good  Plan      Co-evaluation                 AM-PAC OT "6 Clicks" Daily Activity     Outcome Measure   Help from another person eating meals?: None Help from another person taking care of personal grooming?: None Help from another person toileting, which includes using toliet, bedpan, or urinal?: A Little Help from another person bathing (including washing, rinsing, drying)?: A Little Help from another person to put on and taking off regular upper body clothing?: None Help from another person to put on and taking off regular lower body clothing?: None 6 Click Score: 22    End of Session Equipment Utilized During Treatment: Rollator (4 wheels)  OT Visit Diagnosis: Unsteadiness on feet (R26.81);Other abnormalities of gait and mobility (R26.89);Muscle weakness (generalized) (M62.81)   Activity Tolerance Patient tolerated treatment well   Patient Left in bed;with call bell/phone within reach;with bed alarm set   Nurse Communication          Time: 1610-9604 OT Time Calculation (min): 12 min  Charges: OT General Charges $OT Visit: 1 Visit OT  Treatments $Therapeutic Activity: 8-22 mins   Arman Filter., MPH, MS, OTR/L ascom [phone] 08/31/23, 2:36 PM

## 2023-08-31 NOTE — Progress Notes (Signed)
 Progress Note    Allison Hill  WUJ:[id number] DOB: 11/25/1970  DOA: 08/09/2023 PCP: Melvenia Beam, MD      Brief Narrative:    Medical records reviewed and are as summarized below:  Allison Hill is a 53 y.o. female with medical history significant of ESRD on HD MWF, refractory HTN, chronic HFpEF, chronic iron deficiency anemia admitted to hospitalist service for evaluation of chest pain, hypertensive urgency.  Patient's chest pain atypical, troponins negative.  Blood pressure improved with home regimen.  Patient lives with her daughter who does not want to take her home given high risk for falls, not safe for her HD transportation.  HD outpatient facility arranged.  TOC working on placement   On 08/13/2023/08/14/2023 patient is complaining of severe pain in her right ear that is not controlled by tylenol. The pain has not improved with IV Unasyn. CT head demonstrated mastoiditis and otitis media with swelling of the external canal. The patient was given IV Fentanyl for pain and antibiotics were expanded to include cipro PO. She will undergo dialysis on Saturday as part of her usual schedule.  PT/OT recommended nursing home placement, however insurance company denied the application.      Assessment/Plan:   Principal Problem:   Chest pain Active Problems:   Otitis media not resolved, right   Hypertensive urgency   ESRD on dialysis (HCC)   Right upper quadrant abdominal pain    Body mass index is 24.95 kg/m.   Right otitis media Resolved. Completed 7 days of antibiotics on 08/20/2023 (including IV Unasyn followed by ciprofloxacin and Levaquin).    Hypertensive urgency Orthostatic hypotension. BP is better.  Continue antihypertensives. Continue amlodipine, labetalol, losartan, terazosin and hydralazine.    ESRD on dialysis (HCC) Hyperkalemia Hypocalcemia Anemia of chronic kidney disease Plan for hemodialysis today Continue Epogen, Cinacalcet,  sevelamer and calcium carbonate Follow-up with nephrologist for hemodialysis    Chest pain Resolved    Right upper quadrant abdominal pain Improved.  Continue Protonix US of the RUQ of the abdomen is negative for cholecystitis or cholelithiasis.      General weakness No safe discharge plan.  Awaiting placement to long-term care facility Follow-up with transition of care team to assist with disposition   Analgesics as needed for shoulder osteoarthritis   Insurance requested peer to peer review.  Case was discussed with Manufacturing engineer over the phone.  Authorization for SNF was denied. Allison Hill, case manager, has been notified.  TOC to assist with placement.   Diet Order             Diet Heart Room service appropriate? Yes; Fluid consistency: Thin  Diet effective now                            Consultants: Nephrologist  Procedures: None    Medications:    amLODipine  5 mg Oral QPM   aspirin EC  81 mg Oral Daily   atorvastatin  40 mg Oral Daily   bisacodyl  10 mg Rectal Once   calcium carbonate  400 mg of elemental calcium Oral BID   Chlorhexidine Gluconate Cloth  6 each Topical Q0600   cinacalcet  30 mg Oral Q supper   epoetin alfa  10,000 Units Intravenous Q T,Th,Sa-HD   fluticasone  1 spray Each Nare Daily   gabapentin  200 mg Oral BID   heparin  5,000 Units Subcutaneous Q12H   hydrALAZINE  100 mg Oral TID   hydrOXYzine  25 mg Oral TID   irbesartan  300 mg Oral QPM   isosorbide mononitrate  120 mg Oral Daily   labetalol  600 mg Oral BID   pantoprazole  40 mg Oral Daily   senna-docusate  2 tablet Oral BID   sevelamer carbonate  1,600 mg Oral TID with meals   terazosin  2 mg Oral BID   Continuous Infusions:   Anti-infectives (From admission, onward)    Start     Dose/Rate Route Frequency Ordered Stop   08/19/23 1800  levofloxacin (LEVAQUIN) tablet 250 mg        250 mg Oral Every evening 08/19/23 1504 08/20/23 1739    08/15/23 1800  ciprofloxacin (CIPRO) tablet 500 mg  Status:  Discontinued        500 mg Oral Every evening 08/14/23 0856 08/19/23 1502   08/14/23 0915  ciprofloxacin (CIPRO) tablet 500 mg        500 mg Oral  Once 08/14/23 0856 08/14/23 1001   08/14/23 0615  Ampicillin-Sulbactam (UNASYN) 3 g in sodium chloride 0.9 % 100 mL IVPB  Status:  Discontinued        3 g 200 mL/hr over 30 Minutes Intravenous Every 12 hours 08/14/23 0528 08/16/23 1427              Family Communication/Anticipated D/C date and plan/Code Status   DVT prophylaxis: heparin injection 5,000 Units Start: 08/09/23 1000     Code Status: Full Code  Family Communication: None Disposition Plan: Plan to discharge to SNF   Status is: Observation The patient will require care spanning > 2 midnights and should be moved to inpatient because: No safe discharge plan       Subjective:   Interval events noted.  No complaints.  Objective:    Vitals:   08/30/23 1942 08/31/23 0507 08/31/23 0734 08/31/23 0756  BP: 134/80 (!) 176/102 (!) 160/100 (!) 160/100  Pulse: 91 73 83   Resp: 18 16 17    Temp: 98.7 F (37.1 C) 98.2 F (36.8 C) 98.6 F (37 C)   TempSrc:   Oral   SpO2: 100% 100% 100%   Weight:      Height:       No data found.   Intake/Output Summary (Last 24 hours) at 08/31/2023 1447 Last data filed at 08/31/2023 0600 Gross per 24 hour  Intake 120 ml  Output --  Net 120 ml   Filed Weights   08/27/23 1551 08/29/23 0933 08/29/23 1329  Weight: 64.1 kg 65.5 kg 63.9 kg    Exam:   GEN: NAD SKIN: Warm and dry EYES: No pallor or icterus ENT: MMM CV: RRR PULM: CTA B ABD: soft, ND, NT, +BS CNS: AAO x 3, non focal EXT: No edema or tenderness     Data Reviewed:   I have personally reviewed following labs and imaging studies:  Labs: Labs show the following:   Basic Metabolic Panel: Recent Labs  Lab 08/25/23 0811 08/27/23 1220 08/29/23 0948  NA 138 138 137  K 6.2* 5.3* 4.8  CL 103  98 97*  CO2 23 26 26   GLUCOSE 85 95 121*  BUN 113* 97* 72*  CREATININE 8.57* 7.38* 7.00*  CALCIUM 7.6* 7.9* 8.4*  PHOS 5.4* 4.7* 4.6   GFR Estimated Creatinine Clearance: 8.5 mL/min (A) (by C-G formula based on SCr of 7 mg/dL (H)). Liver Function Tests: Recent Labs  Lab 08/25/23 [phone] 08/27/23 1220 08/29/23 6962  ALBUMIN 3.1* 3.3* 3.4*   No results for input(s): "LIPASE", "AMYLASE" in the last 168 hours. No results for input(s): "AMMONIA" in the last 168 hours. Coagulation profile No results for input(s): "INR", "PROTIME" in the last 168 hours.  CBC: Recent Labs  Lab 08/25/23 0811 08/27/23 1220 08/29/23 0948  WBC 8.8 8.2 7.5  HGB 7.6* 7.7* 8.3*  HCT 22.8* 23.3* 25.8*  MCV 84.8 82.3 85.7  PLT 245 214 210   Cardiac Enzymes: No results for input(s): "CKTOTAL", "CKMB", "CKMBINDEX", "TROPONINI" in the last 168 hours. BNP (last 3 results) No results for input(s): "PROBNP" in the last 8760 hours. CBG: No results for input(s): "GLUCAP" in the last 168 hours. D-Dimer: No results for input(s): "DDIMER" in the last 72 hours. Hgb A1c: No results for input(s): "HGBA1C" in the last 72 hours. Lipid Profile: No results for input(s): "CHOL", "HDL", "LDLCALC", "TRIG", "CHOLHDL", "LDLDIRECT" in the last 72 hours. Thyroid function studies: No results for input(s): "TSH", "T4TOTAL", "T3FREE", "THYROIDAB" in the last 72 hours.  Invalid input(s): "FREET3" Anemia work up: No results for input(s): "VITAMINB12", "FOLATE", "FERRITIN", "TIBC", "IRON", "RETICCTPCT" in the last 72 hours. Sepsis Labs: Recent Labs  Lab 08/25/23 0811 08/27/23 1220 08/29/23 0948  WBC 8.8 8.2 7.5    Microbiology No results found for this or any previous visit (from the past 240 hours).  Procedures and diagnostic studies:  No results found.             LOS: 0 days   Erron Wengert  [facility]   Pager on [email or web address]. If 7PM-7AM, please contact night-coverage at  [email or web address]     08/31/2023, 2:47 PM

## 2023-08-31 NOTE — TOC Progression Note (Signed)
 Transition of Care [facility]) - Progression Note    Patient Details  Name: Allison Hill MRN: [id number] Date of Birth: December 15, 1970  Transition of Care [facility]) CM/SW Contact  Marlowe Sax, RN Phone Number: 08/31/2023, 1:25 PM  Clinical Narrative:     Peer2Peer requested before 3.31.25 @ 2pm.  please call [phone] opt 5.  provide pts name. DOB, plan ID [id number].   Physician notified  Expected Discharge Plan:  (TBD) Barriers to Discharge: Continued Medical Work up  Expected Discharge Plan and Services     Post Acute Care Choice:  (TBD) Living arrangements for the past 2 months: Single Family Home                                       Social Determinants of Health (SDOH) Interventions SDOH Screenings   Food Insecurity: No Food Insecurity (08/10/2023)  Recent Concern: Food Insecurity - High Risk (07/17/2023)   Received from [facility]  Housing: Low Risk  (08/10/2023)  Recent Concern: Housing - High Risk (07/22/2023)   Received from [facility]  Transportation Needs: Unmet Transportation Needs (08/10/2023)  Utilities: Not At Risk (08/10/2023)  Recent Concern: Utilities - Medium Risk (07/17/2023)   Received from [facility]  Financial Resource Strain: Patient Declined (01/03/2023)   Received from [facility]  Social Connections: Unknown (08/10/2023)  Tobacco Use: High Risk (08/10/2023)    Readmission Risk Interventions     No data to display

## 2023-08-31 NOTE — TOC Progression Note (Signed)
 Transition of Care [facility]) - Progression Note    Patient Details  Name: Allison Hill MRN: [id number] Date of Birth: May 29, 1971  Transition of Care [facility]) CM/SW Contact  Marlowe Sax, RN Phone Number: 08/31/2023, 12:42 PM  Clinical Narrative:    [id number] ref number ins auth pending   Expected Discharge Plan:  (TBD) Barriers to Discharge: Continued Medical Work up  Expected Discharge Plan and Services     Post Acute Care Choice:  (TBD) Living arrangements for the past 2 months: Single Family Home                                       Social Determinants of Health (SDOH) Interventions SDOH Screenings   Food Insecurity: No Food Insecurity (08/10/2023)  Recent Concern: Food Insecurity - High Risk (07/17/2023)   Received from [facility]  Housing: Low Risk  (08/10/2023)  Recent Concern: Housing - High Risk (07/22/2023)   Received from [facility]  Transportation Needs: Unmet Transportation Needs (08/10/2023)  Utilities: Not At Risk (08/10/2023)  Recent Concern: Utilities - Medium Risk (07/17/2023)   Received from [facility]  Financial Resource Strain: Patient Declined (01/03/2023)   Received from [facility]  Social Connections: Unknown (08/10/2023)  Tobacco Use: High Risk (08/10/2023)    Readmission Risk Interventions     No data to display

## 2023-09-01 DIAGNOSIS — I132 Hypertensive heart and chronic kidney disease with heart failure and with stage 5 chronic kidney disease, or end stage renal disease: Secondary | ICD-10-CM | POA: Diagnosis not present

## 2023-09-01 DIAGNOSIS — R0789 Other chest pain: Secondary | ICD-10-CM | POA: Diagnosis not present

## 2023-09-01 LAB — RENAL FUNCTION PANEL
Albumin: 3.4 g/dL — ABNORMAL LOW (ref 3.5–5.0)
Anion gap: 13 (ref 5–15)
BUN: 92 mg/dL — ABNORMAL HIGH (ref 6–20)
CO2: 25 mmol/L (ref 22–32)
Calcium: 8.1 mg/dL — ABNORMAL LOW (ref 8.9–10.3)
Chloride: 98 mmol/L (ref 98–111)
Creatinine, Ser: 8.31 mg/dL — ABNORMAL HIGH (ref 0.44–1.00)
GFR, Estimated: 5 mL/min — ABNORMAL LOW (ref >60)
Glucose, Bld: 82 mg/dL (ref 70–99)
Phosphorus: 5.4 mg/dL — ABNORMAL HIGH (ref 2.5–4.6)
Potassium: 5.5 mmol/L — ABNORMAL HIGH (ref 3.5–5.1)
Sodium: 136 mmol/L (ref 135–145)

## 2023-09-01 LAB — CBC
HCT: 24.1 % — ABNORMAL LOW (ref 36.0–46.0)
Hemoglobin: 7.7 g/dL — ABNORMAL LOW (ref 12.0–15.0)
MCH: 27.1 pg (ref 26.0–34.0)
MCHC: 32 g/dL (ref 30.0–36.0)
MCV: 84.9 fL (ref 80.0–100.0)
Platelets: 235 10*3/uL (ref 150–400)
RBC: 2.84 MIL/uL — ABNORMAL LOW (ref 3.87–5.11)
RDW: 18.4 % — ABNORMAL HIGH (ref 11.5–15.5)
WBC: 5.5 10*3/uL (ref 4.0–10.5)
nRBC: 0 % (ref 0.0–0.2)

## 2023-09-01 MED ORDER — AMLODIPINE BESYLATE 5 MG PO TABS: 5.0000 mg | ORAL_TABLET | Freq: Once | ORAL | Status: DC

## 2023-09-01 MED ORDER — CALCITRIOL 0.25 MCG PO CAPS
0.2500 ug | ORAL_CAPSULE | ORAL | Status: DC
  Administered 2023-09-01 – 2023-09-22 (×10): 0.25 ug via ORAL
  Filled 2023-09-01 (×12): qty 1

## 2023-09-01 MED ORDER — ALTEPLASE 2 MG IJ SOLR: 2.0000 mg | Freq: Once | INTRAMUSCULAR | Status: DC | PRN

## 2023-09-01 MED ORDER — HEPARIN SODIUM (PORCINE) 1000 UNIT/ML IJ SOLN
INTRAMUSCULAR | Status: AC
  Filled 2023-09-01: qty 10

## 2023-09-01 MED ORDER — AMLODIPINE BESYLATE 10 MG PO TABS
10.0000 mg | ORAL_TABLET | Freq: Every evening | ORAL | Status: DC
  Administered 2023-09-01 – 2023-09-22 (×22): 10 mg via ORAL
  Filled 2023-09-01 (×22): qty 1

## 2023-09-01 MED ORDER — HEPARIN SODIUM (PORCINE) 1000 UNIT/ML DIALYSIS: 1000.0000 [IU] | INTRAMUSCULAR | Status: DC | PRN

## 2023-09-01 NOTE — Progress Notes (Addendum)
 [facility]  ROUNDING NOTE   Subjective:   Patient seen and evaluated during dialysis   HEMODIALYSIS FLOWSHEET:  Blood Flow Rate (mL/min): 349 mL/min Arterial Pressure (mmHg): -161.41 mmHg Venous Pressure (mmHg): 166.86 mmHg TMP (mmHg): 32.12 mmHg Ultrafiltration Rate (mL/min): 1982 mL/min Dialysate Flow Rate (mL/min): 300 ml/min Dialysis Fluid Bolus: Normal Saline  Resting quietly during treatment   Objective:  Vital signs in last 24 hours:  Temp:  [97.9 F (36.6 C)-98.3 F (36.8 C)] 97.9 F (36.6 C) (04/01 1051) Pulse Rate:  [68-85] 68 (04/01 1051) Resp:  [11-18] 13 (04/01 1051) BP: (127-161)/(82-107) 150/95 (04/01 1051) SpO2:  [99 %-100 %] 100 % (04/01 1051) Weight:  [64.7 kg-66.8 kg] 64.7 kg (04/01 1051)  Weight change:  Filed Weights   08/29/23 1329 09/01/23 0724 09/01/23 1051  Weight: 63.9 kg 66.8 kg 64.7 kg    Intake/Output: I/O last 3 completed shifts: In: 240 [P.O.:240] Out: -    Intake/Output this shift:  Total I/O In: -  Out: 2000 [Other:2000]  Physical Exam: General: NAD  Head: Normocephalic, atraumatic. Moist oral mucosal membranes  Eyes: Anicteric  Lungs:  Clear to auscultation  Heart: Regular rate and rhythm  Abdomen:  Soft, nontender  Extremities: No peripheral edema.  Neurologic: Alert, moving all four extremities  Skin: No lesions  Access: Rt Chest Permcath    Basic Metabolic Panel: Recent Labs  Lab 08/27/23 1220 08/29/23 0948 09/01/23 0737  NA 138 137 136  K 5.3* 4.8 5.5*  CL 98 97* 98  CO2 26 26 25   GLUCOSE 95 121* 82  BUN 97* 72* 92*  CREATININE 7.38* 7.00* 8.31*  CALCIUM 7.9* 8.4* 8.1*  PHOS 4.7* 4.6 5.4*    Liver Function Tests: Recent Labs  Lab 08/27/23 1220 08/29/23 0948 09/01/23 0737  ALBUMIN 3.3* 3.4* 3.4*    No results for input(s): "LIPASE", "AMYLASE" in the last 168 hours. No results for input(s): "AMMONIA" in the last 168 hours.  CBC: Recent Labs  Lab 08/27/23 1220 08/29/23 0948  09/01/23 0737  WBC 8.2 7.5 5.5  HGB 7.7* 8.3* 7.7*  HCT 23.3* 25.8* 24.1*  MCV 82.3 85.7 84.9  PLT 214 210 235    Cardiac Enzymes: No results for input(s): "CKTOTAL", "CKMB", "CKMBINDEX", "TROPONINI" in the last 168 hours.  BNP: Invalid input(s): "POCBNP"  CBG: No results for input(s): "GLUCAP" in the last 168 hours.  Microbiology: Results for orders placed or performed during the hospital encounter of 08/09/23  Resp panel by RT-PCR (RSV, Flu A&B, Covid) Anterior Nasal Swab     Status: None   Collection Time: 08/14/23  3:44 AM   Specimen: Anterior Nasal Swab  Result Value Ref Range Status   SARS Coronavirus 2 by RT PCR NEGATIVE NEGATIVE Final    Comment: (NOTE) SARS-CoV-2 target nucleic acids are NOT DETECTED.  The SARS-CoV-2 RNA is generally detectable in upper respiratory specimens during the acute phase of infection. The lowest concentration of SARS-CoV-2 viral copies this assay can detect is 138 copies/mL. A negative result does not preclude SARS-Cov-2 infection and should not be used as the sole basis for treatment or other patient management decisions. A negative result may occur with  improper specimen collection/handling, submission of specimen other than nasopharyngeal swab, presence of viral mutation(s) within the areas targeted by this assay, and inadequate number of viral copies(<138 copies/mL). A negative result must be combined with clinical observations, patient history, and epidemiological information. The expected result is Negative.  Fact Sheet for Patients:  BloggerCourse.com  Fact Sheet for Healthcare Providers:  SeriousBroker.it  This test is no t yet approved or cleared by the [place] FDA and  has been authorized for detection and/or diagnosis of SARS-CoV-2 by FDA under an Emergency Use Authorization (EUA). This EUA will remain  in effect (meaning this test can be used) for the duration of  the COVID-19 declaration under Section 564(b)(1) of the Act, 21 U.S.C.section 360bbb-3(b)(1), unless the authorization is terminated  or revoked sooner.       Influenza A by PCR NEGATIVE NEGATIVE Final   Influenza B by PCR NEGATIVE NEGATIVE Final    Comment: (NOTE) The Xpert Xpress SARS-CoV-2/FLU/RSV plus assay is intended as an aid in the diagnosis of influenza from Nasopharyngeal swab specimens and should not be used as a sole basis for treatment. Nasal washings and aspirates are unacceptable for Xpert Xpress SARS-CoV-2/FLU/RSV testing.  Fact Sheet for Patients: BloggerCourse.com  Fact Sheet for Healthcare Providers: SeriousBroker.it  This test is not yet approved or cleared by the [place] FDA and has been authorized for detection and/or diagnosis of SARS-CoV-2 by FDA under an Emergency Use Authorization (EUA). This EUA will remain in effect (meaning this test can be used) for the duration of the COVID-19 declaration under Section 564(b)(1) of the Act, 21 U.S.C. section 360bbb-3(b)(1), unless the authorization is terminated or revoked.     Resp Syncytial Virus by PCR NEGATIVE NEGATIVE Final    Comment: (NOTE) Fact Sheet for Patients: BloggerCourse.com  Fact Sheet for Healthcare Providers: SeriousBroker.it  This test is not yet approved or cleared by the [place] FDA and has been authorized for detection and/or diagnosis of SARS-CoV-2 by FDA under an Emergency Use Authorization (EUA). This EUA will remain in effect (meaning this test can be used) for the duration of the COVID-19 declaration under Section 564(b)(1) of the Act, 21 U.S.C. section 360bbb-3(b)(1), unless the authorization is terminated or revoked.  Performed at [facility], [address] [place], [place] [address]   Respiratory (~20 pathogens) panel by PCR     Status: None    Collection Time: 08/14/23  3:44 AM   Specimen: Nasopharyngeal Swab; Respiratory  Result Value Ref Range Status   Adenovirus NOT DETECTED NOT DETECTED Final   Coronavirus 229E NOT DETECTED NOT DETECTED Final    Comment: (NOTE) The Coronavirus on the Respiratory Panel, DOES NOT test for the novel  Coronavirus (2019 nCoV)    Coronavirus HKU1 NOT DETECTED NOT DETECTED Final   Coronavirus NL63 NOT DETECTED NOT DETECTED Final   Coronavirus OC43 NOT DETECTED NOT DETECTED Final   Metapneumovirus NOT DETECTED NOT DETECTED Final   Rhinovirus / Enterovirus NOT DETECTED NOT DETECTED Final   Influenza A NOT DETECTED NOT DETECTED Final   Influenza B NOT DETECTED NOT DETECTED Final   Parainfluenza Virus 1 NOT DETECTED NOT DETECTED Final   Parainfluenza Virus 2 NOT DETECTED NOT DETECTED Final   Parainfluenza Virus 3 NOT DETECTED NOT DETECTED Final   Parainfluenza Virus 4 NOT DETECTED NOT DETECTED Final   Respiratory Syncytial Virus NOT DETECTED NOT DETECTED Final   Bordetella pertussis NOT DETECTED NOT DETECTED Final   Bordetella Parapertussis NOT DETECTED NOT DETECTED Final   Chlamydophila pneumoniae NOT DETECTED NOT DETECTED Final   Mycoplasma pneumoniae NOT DETECTED NOT DETECTED Final    Comment: Performed at [facility], [address], [place], [place] [address]  Culture, blood (Routine X 2) w Reflex to ID Panel     Status: None   Collection  Time: 08/14/23  9:19 AM   Specimen: BLOOD  Result Value Ref Range Status   Specimen Description BLOOD BLOOD RIGHT HAND  Final   Special Requests   Final    BOTTLES DRAWN AEROBIC AND ANAEROBIC Blood Culture adequate volume   Culture   Final    NO GROWTH 5 DAYS Performed at [facility], [address] [place], [place] [address]    Report Status 08/19/2023 FINAL  Final  Culture, blood (Routine X 2) w Reflex to ID Panel     Status: None   Collection Time: 08/14/23  9:19 AM   Specimen: BLOOD  Result Value Ref Range Status   Specimen  Description BLOOD BLOOD LEFT HAND  Final   Special Requests   Final    BOTTLES DRAWN AEROBIC AND ANAEROBIC Blood Culture adequate volume   Culture   Final    NO GROWTH 5 DAYS Performed at [facility], [address] [place], [place] [address]    Report Status 08/19/2023 FINAL  Final  Ear culture     Status: None   Collection Time: 08/14/23 10:11 AM   Specimen: Ear; Other  Result Value Ref Range Status   Specimen Description   Final    EAR Performed at [facility], [address] [place], [place] [address]    Special Requests   Final    RIGHT EAR Performed at [facility], [address] [place], [place] [address]    Culture RARE STREPTOCOCCUS PNEUMONIAE  Final   Report Status 08/16/2023 FINAL  Final   Organism ID, Bacteria STREPTOCOCCUS PNEUMONIAE  Final      Susceptibility   Streptococcus pneumoniae - MIC*    ERYTHROMYCIN <=0.12 SENSITIVE Sensitive     LEVOFLOXACIN 1 SENSITIVE Sensitive     VANCOMYCIN 0.5 SENSITIVE Sensitive     PENICILLIN (meningitis) <=0.06 SENSITIVE Sensitive     PENO - penicillin <=0.06      PENICILLIN (non-meningitis) <=0.06 SENSITIVE Sensitive     PENICILLIN (oral) <=0.06 SENSITIVE Sensitive     CEFTRIAXONE (non-meningitis) <=0.12 SENSITIVE Sensitive     CEFTRIAXONE (meningitis) <=0.12 SENSITIVE Sensitive     * RARE STREPTOCOCCUS PNEUMONIAE    Coagulation Studies: No results for input(s): "LABPROT", "INR" in the last 72 hours.  Urinalysis: No results for input(s): "COLORURINE", "LABSPEC", "PHURINE", "GLUCOSEU", "HGBUR", "BILIRUBINUR", "KETONESUR", "PROTEINUR", "UROBILINOGEN", "NITRITE", "LEUKOCYTESUR" in the last 72 hours.  Invalid input(s): "APPERANCEUR"    Imaging: No results found.    Medications:     amLODipine  5 mg Oral QPM   aspirin EC  81 mg Oral Daily   atorvastatin  40 mg Oral Daily   bisacodyl  10 mg Rectal Once   calcium carbonate  400 mg of elemental calcium Oral BID   Chlorhexidine  Gluconate Cloth  6 each Topical Q0600   cinacalcet  30 mg Oral Q supper   epoetin alfa  10,000 Units Intravenous Q T,Th,Sa-HD   fluticasone  1 spray Each Nare Daily   gabapentin  200 mg Oral BID   heparin  5,000 Units Subcutaneous Q12H   hydrALAZINE  100 mg Oral TID   hydrOXYzine  25 mg Oral TID   irbesartan  300 mg Oral QPM   isosorbide mononitrate  120 mg Oral Daily   labetalol  600 mg Oral BID   pantoprazole  40 mg Oral Daily   senna-docusate  2 tablet Oral BID   sevelamer carbonate  1,600 mg Oral TID with meals  terazosin  2 mg Oral BID   guaiFENesin-dextromethorphan, ondansetron (ZOFRAN) IV, oxyCODONE  Assessment/ Plan:  [name] is a 53 y.o.  female  with past medical conditions including hypertension, CHF, anemia, and end-stage renal disease on hemodialysis, who was admitted to [facility] on 08/09/2023 for Hypertensive urgency [I16.0] Chest pain [R07.9] Nonspecific chest pain [R07.9] Anemia due to chronic kidney disease, on chronic dialysis (HCC) [N18.6, D63.1, Z99.2]  Outpatient dialysis clinic has been arranged at [facility] on a TTS schedule, chair time at 530 am.    1.  Hypertensive urgency, on admission Amlodipine restarted.   Blood pressure 138/92 during dialysis.  2. Anemia of chronic kidney disease Hemoglobin & Hematocrit     Component Value Date/Time   HGB 7.7 (L) 09/01/2023 0737   HCT 24.1 (L) 09/01/2023 0737  Hemoglobin improved.  Will continue IV EPO with  dialysis treatment.   3.  End stage renal disease on hemodialysis.  Receiving dialysis today seated in chair, UF 2L. Next treatment scheduled for Thursday.   4. Secondary Hyperparathyroidism:   Lab Results  Component Value Date   PTH 1,248 (H) 08/11/2023   CALCIUM 8.1 (L) 09/01/2023   PHOS 5.4 (H) 09/01/2023  Will continue to monitor bone minerals.   Currently prescribed calcium carbonate, sevelamer and Cinacalcet.  Start calcitriol   LOS: 0 Shantelle Breeze 4/1/20251:43 PM     Patient was seen and examined with Wendee Beavers, NP.  Plan of care was formulated for the problems addressed and discussed with NP.  I agree with the note as documented except as noted below.

## 2023-09-01 NOTE — Plan of Care (Signed)

## 2023-09-01 NOTE — Progress Notes (Signed)
 PT Cancellation Note  Patient Details Name: Allison Hill MRN: [id number] DOB: 1971/01/02   Cancelled Treatment:    Reason Eval/Treat Not Completed: Patient at procedure or test/unavailable.  Chart reviewed.  Pt currently out of the room for dialysis. PT to follow up when pt is next available for PT intervention.    Nolon Bussing, PT, DPT Physical Therapist - [facility]  09/01/23, 10:32 AM

## 2023-09-01 NOTE — TOC Progression Note (Signed)
 Transition of Care [facility]) - Progression Note    Patient Details  Name: Allison Hill MRN: [id number] Date of Birth: 1970/11/01  Transition of Care [facility]) CM/SW Contact  Marlowe Sax, RN Phone Number: 09/01/2023, 8:37 AM  Clinical Narrative:     Denied Plan Auth ID: [id number] 08/29/23   Expected Discharge Plan:  (TBD) Barriers to Discharge: Continued Medical Work up  Expected Discharge Plan and Services     Post Acute Care Choice:  (TBD) Living arrangements for the past 2 months: Single Family Home                                       Social Determinants of Health (SDOH) Interventions SDOH Screenings   Food Insecurity: No Food Insecurity (08/10/2023)  Recent Concern: Food Insecurity - High Risk (07/17/2023)   Received from [facility]  Housing: Low Risk  (08/10/2023)  Recent Concern: Housing - High Risk (07/22/2023)   Received from [facility]  Transportation Needs: Unmet Transportation Needs (08/10/2023)  Utilities: Not At Risk (08/10/2023)  Recent Concern: Utilities - Medium Risk (07/17/2023)   Received from [facility]  Financial Resource Strain: Patient Declined (01/03/2023)   Received from [facility]  Social Connections: Unknown (08/10/2023)  Tobacco Use: High Risk (08/10/2023)    Readmission Risk Interventions     No data to display

## 2023-09-01 NOTE — Progress Notes (Signed)
 Hemodialysis Note:  Received patient in bed to unit. Alert and oriented. Informed consent singed and in chart.  Treatment initiated: 0737 Treatment completed: 1051  Access used: Right Subclavian catheter Access issues: Cartridge changed due to system clott  Patient tolerated well. Transported back to room, alert without acute distress. Report given to patient's RN.  Total UF removed: 2 L Medications given: Epogen 10000 units IV  Post HD weight: 64.7 Kg  [facility]

## 2023-09-01 NOTE — Progress Notes (Signed)
 PT Cancellation Note  Patient Details Name: Allison Hill MRN: [id number] DOB: 08/26/1970   Cancelled Treatment:    Reason Eval/Treat Not Completed: Fatigue/lethargy limiting ability to participate.  Attempted for the second time with pt stating she just got back from dialysis and was too fatigued to do anything at this point in time.  Pt encouraged to participate, ultimately declining therapy.  Will re-attempt at later date/time as medically appropriate.   Nolon Bussing, PT, DPT Physical Therapist - [facility]  09/01/23, 2:08 PM

## 2023-09-01 NOTE — Progress Notes (Addendum)
 Progress Note    Allison Hill  WUJ:[id number] DOB: [birth year]  DOA: 08/09/2023 PCP: Melvenia Beam, MD      Brief Narrative:    Medical records reviewed and are as summarized below:  Allison Hill is a 53 y.o. female with medical history significant of ESRD on HD MWF, refractory HTN, chronic HFpEF, chronic iron deficiency anemia admitted to hospitalist service for evaluation of chest pain, hypertensive urgency.  Patient's chest pain atypical, troponins negative.  Blood pressure improved with home regimen.  Patient lives with her daughter who does not want to take her home given high risk for falls, not safe for her HD transportation.  HD outpatient facility arranged.  TOC working on placement   On 08/13/2023/08/14/2023 patient is complaining of severe pain in her right ear that is not controlled by tylenol. The pain has not improved with IV Unasyn. CT head demonstrated mastoiditis and otitis media with swelling of the external canal. The patient was given IV Fentanyl for pain and antibiotics were expanded to include cipro PO. She will undergo dialysis on Saturday as part of her usual schedule.  PT/OT recommended nursing home placement, however insurance company denied the application.      Assessment/Plan:   Principal Problem:   Chest pain Active Problems:   Otitis media not resolved, right   Hypertensive urgency   ESRD on dialysis (HCC)   Right upper quadrant abdominal pain    Body mass index is 25.27 kg/m.   Right otitis media Resolved. Completed 7 days of antibiotics on 08/20/2023 (including IV Unasyn followed by ciprofloxacin and Levaquin).    Hypertensive urgency Orthostatic hypotension. Fluctuating BP.  BP  significantly elevated.  Increase amlodipine again from 5 to 10 mg daily.   Continue amlodipine, labetalol, losartan, terazosin and hydralazine.    ESRD on dialysis (HCC) Hyperkalemia Hypocalcemia Anemia of chronic kidney disease Plan  for hemodialysis today Continue Epogen, Cinacalcet, sevelamer and calcium carbonate Follow-up with nephrologist for hemodialysis    Chest pain Resolved    Right upper quadrant abdominal pain Improved.  Continue Protonix US of the RUQ of the abdomen is negative for cholecystitis or cholelithiasis.      General weakness No safe discharge plan.  Awaiting placement to long-term care facility Follow-up with transition of care team to assist with disposition   Analgesics as needed for shoulder osteoarthritis   Patient has been informed that insurance authorization for SNF was denied.  She said she has nowhere to go.  She is going to discuss this further with her daughter and come up with a plan.  Discussed with Deliliah, case Production designer, theatre/television/film.  She said patient may have to pay out-of-pocket to get to SNF.  Follow-up with TOC to assist with disposition.   Diet Order             Diet Heart Room service appropriate? Yes; Fluid consistency: Thin  Diet effective now                            Consultants: Nephrologist  Procedures: None    Medications:    amLODipine  10 mg Oral QPM   aspirin EC  81 mg Oral Daily   atorvastatin  40 mg Oral Daily   bisacodyl  10 mg Rectal Once   calcitRIOL  0.25 mcg Oral Q T,Th,Sa-HD   calcium carbonate  400 mg of elemental calcium Oral BID   Chlorhexidine Gluconate Cloth  6 each  Topical Q0600   cinacalcet  30 mg Oral Q supper   epoetin alfa  10,000 Units Intravenous Q T,Th,Sa-HD   fluticasone  1 spray Each Nare Daily   gabapentin  200 mg Oral BID   heparin  5,000 Units Subcutaneous Q12H   hydrALAZINE  100 mg Oral TID   hydrOXYzine  25 mg Oral TID   irbesartan  300 mg Oral QPM   isosorbide mononitrate  120 mg Oral Daily   labetalol  600 mg Oral BID   pantoprazole  40 mg Oral Daily   senna-docusate  2 tablet Oral BID   sevelamer carbonate  1,600 mg Oral TID with meals   terazosin  2 mg Oral BID   Continuous  Infusions:   Anti-infectives (From admission, onward)    Start     Dose/Rate Route Frequency Ordered Stop   08/19/23 1800  levofloxacin (LEVAQUIN) tablet 250 mg        250 mg Oral Every evening 08/19/23 1504 08/20/23 1739   08/15/23 1800  ciprofloxacin (CIPRO) tablet 500 mg  Status:  Discontinued        500 mg Oral Every evening 08/14/23 0856 08/19/23 1502   08/14/23 0915  ciprofloxacin (CIPRO) tablet 500 mg        500 mg Oral  Once 08/14/23 0856 08/14/23 1001   08/14/23 0615  Ampicillin-Sulbactam (UNASYN) 3 g in sodium chloride 0.9 % 100 mL IVPB  Status:  Discontinued        3 g 200 mL/hr over 30 Minutes Intravenous Every 12 hours 08/14/23 0528 08/16/23 1427              Family Communication/Anticipated D/C date and plan/Code Status   DVT prophylaxis: heparin injection 5,000 Units Start: 08/09/23 1000     Code Status: Full Code  Family Communication: None Disposition Plan: Plan to discharge to SNF   Status is: Observation The patient will require care spanning > 2 midnights and should be moved to inpatient because: No safe discharge plan       Subjective:   Interval events noted.  No complaints.  Objective:    Vitals:   09/01/23 1030 09/01/23 1051 09/01/23 1544 09/01/23 1550  BP: (!) 138/92 (!) 150/95 (!) 207/113 (!) 210/118  Pulse: 75 68 81   Resp: 11 13 16    Temp:  97.9 F (36.6 C) 98.2 F (36.8 C)   TempSrc:  Oral    SpO2: 100% 100% 100%   Weight:  64.7 kg    Height:       No data found.   Intake/Output Summary (Last 24 hours) at 09/01/2023 1608 Last data filed at 09/01/2023 1051 Gross per 24 hour  Intake --  Output 2000 ml  Net -2000 ml   Filed Weights   08/29/23 1329 09/01/23 0724 09/01/23 1051  Weight: 63.9 kg 66.8 kg 64.7 kg    Exam:  GEN: No acute distress SKIN: Warm and dry EYES: No pallor or icterus ENT: MMM CV: RRR PULM: CTA B ABD: soft, ND, NT, +BS CNS: AAO x 3, non focal EXT: No edema or tenderness   Data Reviewed:    I have personally reviewed following labs and imaging studies:  Labs: Labs show the following:   Basic Metabolic Panel: Recent Labs  Lab 08/27/23 1220 08/29/23 0948 09/01/23 0737  NA 138 137 136  K 5.3* 4.8 5.5*  CL 98 97* 98  CO2 26 26 25   GLUCOSE 95 121* 82  BUN 97* 72* 92*  CREATININE 7.38* 7.00* 8.31*  CALCIUM 7.9* 8.4* 8.1*  PHOS 4.7* 4.6 5.4*   GFR Estimated Creatinine Clearance: 7.2 mL/min (A) (by C-G formula based on SCr of 8.31 mg/dL (H)). Liver Function Tests: Recent Labs  Lab 08/27/23 1220 08/29/23 0948 09/01/23 0737  ALBUMIN 3.3* 3.4* 3.4*   No results for input(s): "LIPASE", "AMYLASE" in the last 168 hours. No results for input(s): "AMMONIA" in the last 168 hours. Coagulation profile No results for input(s): "INR", "PROTIME" in the last 168 hours.  CBC: Recent Labs  Lab 08/27/23 1220 08/29/23 0948 09/01/23 0737  WBC 8.2 7.5 5.5  HGB 7.7* 8.3* 7.7*  HCT 23.3* 25.8* 24.1*  MCV 82.3 85.7 84.9  PLT 214 210 235   Cardiac Enzymes: No results for input(s): "CKTOTAL", "CKMB", "CKMBINDEX", "TROPONINI" in the last 168 hours. BNP (last 3 results) No results for input(s): "PROBNP" in the last 8760 hours. CBG: No results for input(s): "GLUCAP" in the last 168 hours. D-Dimer: No results for input(s): "DDIMER" in the last 72 hours. Hgb A1c: No results for input(s): "HGBA1C" in the last 72 hours. Lipid Profile: No results for input(s): "CHOL", "HDL", "LDLCALC", "TRIG", "CHOLHDL", "LDLDIRECT" in the last 72 hours. Thyroid function studies: No results for input(s): "TSH", "T4TOTAL", "T3FREE", "THYROIDAB" in the last 72 hours.  Invalid input(s): "FREET3" Anemia work up: No results for input(s): "VITAMINB12", "FOLATE", "FERRITIN", "TIBC", "IRON", "RETICCTPCT" in the last 72 hours. Sepsis Labs: Recent Labs  Lab 08/27/23 1220 08/29/23 0948 09/01/23 0737  WBC 8.2 7.5 5.5    Microbiology No results found for this or any previous visit (from the  past 240 hours).  Procedures and diagnostic studies:  No results found.             LOS: 0 days   Allison Hill  [facility]   Pager on [email or web address]. If 7PM-7AM, please contact night-coverage at [email or web address]     09/01/2023, 4:08 PM

## 2023-09-02 DIAGNOSIS — R079 Chest pain, unspecified: Secondary | ICD-10-CM

## 2023-09-02 DIAGNOSIS — D631 Anemia in chronic kidney disease: Secondary | ICD-10-CM

## 2023-09-02 DIAGNOSIS — N186 End stage renal disease: Secondary | ICD-10-CM | POA: Diagnosis not present

## 2023-09-02 DIAGNOSIS — H6691 Otitis media, unspecified, right ear: Secondary | ICD-10-CM | POA: Diagnosis not present

## 2023-09-02 DIAGNOSIS — I160 Hypertensive urgency: Secondary | ICD-10-CM | POA: Diagnosis not present

## 2023-09-02 DIAGNOSIS — I132 Hypertensive heart and chronic kidney disease with heart failure and with stage 5 chronic kidney disease, or end stage renal disease: Secondary | ICD-10-CM | POA: Diagnosis not present

## 2023-09-02 NOTE — Progress Notes (Signed)
 Progress Note    Allison Hill  UJW:[id number] DOB: Dec 08, 1970  DOA: 08/09/2023 PCP: Melvenia Beam, MD      Brief Narrative:    Medical records reviewed and are as summarized below:  Allison Hill is a 53 y.o. female with medical history significant of ESRD on HD MWF, refractory HTN, chronic HFpEF, chronic iron deficiency anemia admitted to hospitalist service for evaluation of chest pain, hypertensive urgency.  Patient's chest pain atypical, troponins negative.  Blood pressure improved with home regimen.  Patient lives with her daughter who does not want to take her home given high risk for falls, not safe for her HD transportation.  HD outpatient facility arranged.  TOC working on placement   On 08/13/2023/08/14/2023 patient is complaining of severe pain in her right ear that is not controlled by tylenol. The pain has not improved with IV Unasyn. CT head demonstrated mastoiditis and otitis media with swelling of the external canal. The patient was given IV Fentanyl for pain and antibiotics were expanded to include cipro PO. She will undergo dialysis on Saturday as part of her usual schedule.  PT/OT recommended nursing home placement, however insurance company denied the application.  4/2: Remained medically stable.  Difficult disposition as daughter does not want her coming back to her home.  TOC is working on it   Assessment/Plan:   Principal Problem:   Chest pain Active Problems:   Otitis media not resolved, right   Hypertensive urgency   ESRD on dialysis (HCC)   Right upper quadrant abdominal pain    Body mass index is 25.27 kg/m.   Right otitis media Resolved. Completed 7 days of antibiotics on 08/20/2023 (including IV Unasyn followed by ciprofloxacin and Levaquin).    Hypertensive urgency Orthostatic hypotension. Fluctuating BP.  BP  significantly elevated.  Increase amlodipine again from 5 to 10 mg daily.   Continue amlodipine, labetalol,  losartan, terazosin and hydralazine.    ESRD on dialysis (HCC) Hyperkalemia Hypocalcemia Anemia of chronic kidney disease Plan for hemodialysis today Continue Epogen, Cinacalcet, sevelamer and calcium carbonate Follow-up with nephrologist for hemodialysis    Chest pain Resolved    Right upper quadrant abdominal pain Improved.  Continue Protonix US of the RUQ of the abdomen is negative for cholecystitis or cholelithiasis.      General weakness No safe discharge plan.  Awaiting placement to long-term care facility Follow-up with transition of care team to assist with disposition   Analgesics as needed for shoulder osteoarthritis   Patient has been informed that insurance authorization for SNF was denied.  She said she has nowhere to go.  She is going to discuss this further with her daughter and come up with a plan.  Discussed with Deliliah, case Production designer, theatre/television/film.  She said patient may have to pay out-of-pocket to get to SNF.  Follow-up with TOC to assist with disposition.   Diet Order             Diet Heart Room service appropriate? Yes; Fluid consistency: Thin  Diet effective now                  Consultants: Nephrologist  Procedures: None   Medications:    amLODipine  10 mg Oral QPM   aspirin EC  81 mg Oral Daily   atorvastatin  40 mg Oral Daily   calcitRIOL  0.25 mcg Oral Q T,Th,Sa-HD   calcium carbonate  400 mg of elemental calcium Oral BID   Chlorhexidine Gluconate Cloth  6 each Topical Q0600   cinacalcet  30 mg Oral Q supper   epoetin alfa  10,000 Units Intravenous Q T,Th,Sa-HD   fluticasone  1 spray Each Nare Daily   gabapentin  200 mg Oral BID   heparin  5,000 Units Subcutaneous Q12H   hydrALAZINE  100 mg Oral TID   hydrOXYzine  25 mg Oral TID   irbesartan  300 mg Oral QPM   isosorbide mononitrate  120 mg Oral Daily   labetalol  600 mg Oral BID   pantoprazole  40 mg Oral Daily   senna-docusate  2 tablet Oral BID   sevelamer carbonate  1,600 mg Oral  TID with meals   terazosin  2 mg Oral BID   Continuous Infusions:   Anti-infectives (From admission, onward)    Start     Dose/Rate Route Frequency Ordered Stop   08/19/23 1800  levofloxacin (LEVAQUIN) tablet 250 mg        250 mg Oral Every evening 08/19/23 1504 08/20/23 1739   08/15/23 1800  ciprofloxacin (CIPRO) tablet 500 mg  Status:  Discontinued        500 mg Oral Every evening 08/14/23 0856 08/19/23 1502   08/14/23 0915  ciprofloxacin (CIPRO) tablet 500 mg        500 mg Oral  Once 08/14/23 0856 08/14/23 1001   08/14/23 0615  Ampicillin-Sulbactam (UNASYN) 3 g in sodium chloride 0.9 % 100 mL IVPB  Status:  Discontinued        3 g 200 mL/hr over 30 Minutes Intravenous Every 12 hours 08/14/23 0528 08/16/23 1427        Family Communication/Anticipated D/C date and plan/Code Status   DVT prophylaxis: heparin injection 5,000 Units Start: 08/09/23 1000     Code Status: Full Code  Family Communication: None Disposition Plan: Plan to discharge to SNF   Status is: Observation The patient will require care spanning > 2 midnights and should be moved to inpatient because: No safe discharge plan   Subjective:  Patient was seen and examined today.  No new concern.  Objective:    Vitals:   09/01/23 2018 09/01/23 2115 09/02/23 0308 09/02/23 0736  BP: (!) 145/83 (!) 145/83 137/82 138/89  Pulse: 87 87 77 80  Resp: 18  18 16   Temp: 98.3 F (36.8 C)  97.6 F (36.4 C) 98.6 F (37 C)  TempSrc:    Oral  SpO2: 100%  100% 95%  Weight:      Height:       No data found.   Intake/Output Summary (Last 24 hours) at 09/02/2023 1358 Last data filed at 09/02/2023 0951 Gross per 24 hour  Intake 120 ml  Output --  Net 120 ml   Filed Weights   08/29/23 1329 09/01/23 0724 09/01/23 1051  Weight: 63.9 kg 66.8 kg 64.7 kg    Exam: General.  Well-developed lady, in no acute distress. Pulmonary.  Lungs clear bilaterally, normal respiratory effort. CV.  Regular rate and rhythm, no  JVD, rub or murmur. Abdomen.  Soft, nontender, nondistended, BS positive. CNS.  Alert and oriented .  No focal neurologic deficit. Extremities.  No edema, no cyanosis, pulses intact and symmetrical. Psychiatry.  Judgment and insight appears normal.    Data Reviewed:   I have personally reviewed following labs and imaging studies:  Labs: Labs show the following:   Basic Metabolic Panel: Recent Labs  Lab 08/27/23 1220 08/29/23 0948 09/01/23 0737  NA 138 137 136  K 5.3* 4.8  5.5*  CL 98 97* 98  CO2 26 26 25   GLUCOSE 95 121* 82  BUN 97* 72* 92*  CREATININE 7.38* 7.00* 8.31*  CALCIUM 7.9* 8.4* 8.1*  PHOS 4.7* 4.6 5.4*   GFR Estimated Creatinine Clearance: 7.2 mL/min (A) (by C-G formula based on SCr of 8.31 mg/dL (H)). Liver Function Tests: Recent Labs  Lab 08/27/23 1220 08/29/23 0948 09/01/23 0737  ALBUMIN 3.3* 3.4* 3.4*   No results for input(s): "LIPASE", "AMYLASE" in the last 168 hours. No results for input(s): "AMMONIA" in the last 168 hours. Coagulation profile No results for input(s): "INR", "PROTIME" in the last 168 hours.  CBC: Recent Labs  Lab 08/27/23 1220 08/29/23 0948 09/01/23 0737  WBC 8.2 7.5 5.5  HGB 7.7* 8.3* 7.7*  HCT 23.3* 25.8* 24.1*  MCV 82.3 85.7 84.9  PLT 214 210 235   Cardiac Enzymes: No results for input(s): "CKTOTAL", "CKMB", "CKMBINDEX", "TROPONINI" in the last 168 hours. BNP (last 3 results) No results for input(s): "PROBNP" in the last 8760 hours. CBG: No results for input(s): "GLUCAP" in the last 168 hours. D-Dimer: No results for input(s): "DDIMER" in the last 72 hours. Hgb A1c: No results for input(s): "HGBA1C" in the last 72 hours. Lipid Profile: No results for input(s): "CHOL", "HDL", "LDLCALC", "TRIG", "CHOLHDL", "LDLDIRECT" in the last 72 hours. Thyroid function studies: No results for input(s): "TSH", "T4TOTAL", "T3FREE", "THYROIDAB" in the last 72 hours.  Invalid input(s): "FREET3" Anemia work up: No results for  input(s): "VITAMINB12", "FOLATE", "FERRITIN", "TIBC", "IRON", "RETICCTPCT" in the last 72 hours. Sepsis Labs: Recent Labs  Lab 08/27/23 1220 08/29/23 0948 09/01/23 0737  WBC 8.2 7.5 5.5    Microbiology No results found for this or any previous visit (from the past 240 hours).  Procedures and diagnostic studies:  No results found.    LOS: 0 days   Arnetha Courser MD  [facility]   Pager on [email or web address]. If 7PM-7AM, please contact night-coverage at [email or web address]  09/02/2023, 1:58 PM

## 2023-09-02 NOTE — TOC Progression Note (Signed)
 Transition of Care [facility]) - Progression Note    Patient Details  Name: Allison Hill MRN: [id number] Date of Birth: 07-23-1970  [facility] CM/SW Contact  Marlowe Sax, RN Phone Number: 09/02/2023, 3:16 PM  Clinical Narrative:    Spoke with the patient, she stated that she would be agreeable to go to Long term care at a facility she has Dialysis at [facility] 3 days a week, Fl2 updated and sent out to facilities looking for a long term facility, will review options once obtained   Expected Discharge Plan:  (TBD) Barriers to Discharge: Continued Medical Work up  Expected Discharge Plan and Services     Post Acute Care Choice:  (TBD) Living arrangements for the past 2 months: Single Family Home                                       Social Determinants of Health (SDOH) Interventions SDOH Screenings   Food Insecurity: No Food Insecurity (08/10/2023)  Recent Concern: Food Insecurity - High Risk (07/17/2023)   Received from [facility]  Housing: Low Risk  (08/10/2023)  Recent Concern: Housing - High Risk (07/22/2023)   Received from [facility]  Transportation Needs: Unmet Transportation Needs (08/10/2023)  Utilities: Not At Risk (08/10/2023)  Recent Concern: Utilities - Medium Risk (07/17/2023)   Received from [facility]  Financial Resource Strain: Patient Declined (01/03/2023)   Received from [facility]  Social Connections: Unknown (08/10/2023)  Tobacco Use: High Risk (08/10/2023)    Readmission Risk Interventions     No data to display

## 2023-09-02 NOTE — Plan of Care (Signed)
  Problem: Education: Goal: Understanding of cardiac disease, CV risk reduction, and recovery process will improve Outcome: Progressing   Problem: Activity: Goal: Ability to tolerate increased activity will improve Outcome: Progressing   Problem: Cardiac: Goal: Ability to achieve and maintain adequate cardiovascular perfusion will improve Outcome: Progressing   Problem: Health Behavior/Discharge Planning: Goal: Ability to safely manage health-related needs after discharge will improve Outcome: Progressing   Problem: Education: Goal: Knowledge of General Education information will improve Description: Including pain rating scale, medication(s)/side effects and non-pharmacologic comfort measures Outcome: Progressing   Problem: Health Behavior/Discharge Planning: Goal: Ability to manage health-related needs will improve Outcome: Progressing   Problem: Clinical Measurements: Goal: Ability to maintain clinical measurements within normal limits will improve Outcome: Progressing Goal: Will remain free from infection Outcome: Progressing Goal: Diagnostic test results will improve Outcome: Progressing Goal: Respiratory complications will improve Outcome: Progressing Goal: Cardiovascular complication will be avoided Outcome: Progressing   Problem: Activity: Goal: Risk for activity intolerance will decrease Outcome: Progressing   Problem: Nutrition: Goal: Adequate nutrition will be maintained Outcome: Progressing   Problem: Coping: Goal: Level of anxiety will decrease Outcome: Progressing   Problem: Elimination: Goal: Will not experience complications related to bowel motility Outcome: Progressing Goal: Will not experience complications related to urinary retention Outcome: Progressing   Problem: Pain Managment: Goal: General experience of comfort will improve and/or be controlled Outcome: Progressing   Problem: Safety: Goal: Ability to remain free from injury will  improve Outcome: Progressing   Problem: Skin Integrity: Goal: Risk for impaired skin integrity will decrease Outcome: Progressing

## 2023-09-02 NOTE — TOC Progression Note (Signed)
 Transition of Care [facility]) - Progression Note    Patient Details  Name: Allison Hill MRN: [id number] Date of Birth: [birth year]  [facility]) CM/SW Contact  Marlowe Sax, RN Phone Number: 09/02/2023, 8:54 AM  Clinical Narrative:    Spoke with the daughter Nicole Cella on the phone, she stated that the patient can not come to her home at [place] unless she has Full time care, I explained that Her Ins does not cover Full time care at home and that Ins has denied  paying for her going to rehab, she stated that the patient will not be going to her home at dc I asked her if the patient lived with her prior to Admission, she stated that she did not, The patient reported that she did live with her daughter prior to admission, The patient reports that she does not have another home that she lived with her daughter she stated that she was on a waiting list for an apartment in [place] and in [place] She has no other children, she stated that she has family in [place], she has not talked to them in months, she stated that she dont have anywhere else to go that she planned to go to her daughters house   Expected Discharge Plan:  (TBD) Barriers to Discharge: Continued Medical Work up  Expected Discharge Plan and Services     Post Acute Care Choice:  (TBD) Living arrangements for the past 2 months: Single Family Home                                       Social Determinants of Health (SDOH) Interventions SDOH Screenings   Food Insecurity: No Food Insecurity (08/10/2023)  Recent Concern: Food Insecurity - High Risk (07/17/2023)   Received from [facility]  Housing: Low Risk  (08/10/2023)  Recent Concern: Housing - High Risk (07/22/2023)   Received from [facility]  Transportation Needs: Unmet Transportation Needs (08/10/2023)  Utilities: Not At Risk (08/10/2023)  Recent Concern: Utilities - Medium Risk (07/17/2023)   Received from [facility]  Financial Resource Strain:  Patient Declined (01/03/2023)   Received from [facility]  Social Connections: Unknown (08/10/2023)  Tobacco Use: High Risk (08/10/2023)    Readmission Risk Interventions     No data to display

## 2023-09-02 NOTE — [place] FL2 (Signed)
 [place] MEDICAID FL2 LEVEL OF CARE FORM     IDENTIFICATION  Patient Name: Allison Hill Birthdate: 1970-11-01 Sex: female Admission Date (Current Location): 08/09/2023  [facility] and [place] Number:  Chiropodist and Address:  [facility], [address] [place], [place] [address]      Provider Number: [id number]  Attending Physician Name and Address:  Arnetha Courser, MD  Relative Name and Phone Number:  Madaline Guthrie  Daughter  Emergency Contact  [phone]    Current Level of Care: Hospital Recommended Level of Care: Nursing Facility Prior Approval Number:    Date Approved/Denied:   PASRR Number: [id number]  Discharge Plan: Other (Comment) (Long term care)    Current Diagnoses: Patient Active Problem List   Diagnosis Date Noted   Right upper quadrant abdominal pain 08/17/2023   Otitis media not resolved, right 08/14/2023   Hypertensive urgency 08/14/2023   ESRD on dialysis (HCC) 08/14/2023   Nonspecific chest pain 08/09/2023    Orientation RESPIRATION BLADDER Height & Weight     Self, Time, Situation, Place  Normal Continent Weight: 64.7 kg Height:  5\' 3"  (160 cm)  BEHAVIORAL SYMPTOMS/MOOD NEUROLOGICAL BOWEL NUTRITION STATUS   (None)  (None) Continent Diet (Renal/ Carb modified)  AMBULATORY STATUS COMMUNICATION OF NEEDS Skin   Limited Assist Verbally Normal                       Personal Care Assistance Level of Assistance  Bathing, Feeding, Dressing Bathing Assistance: Limited assistance Feeding assistance: Independent Dressing Assistance: Limited assistance     Functional Limitations Info  Sight, Hearing, Speech Sight Info: Adequate Hearing Info: Adequate Speech Info: Adequate    SPECIAL CARE FACTORS FREQUENCY  PT (By licensed PT), OT (By licensed OT)     PT Frequency: 5 x week OT Frequency: 5 x week            Contractures Contractures Info: Not present    Additional Factors Info  Code  Status, Allergies Code Status Info: Full code Allergies Info: Ceftriaxone, Shellfish Allergy, Minoxidil           Current Medications (09/02/2023):  This is the current hospital active medication list Current Facility-Administered Medications  Medication Dose Route Frequency Provider Last Rate Last Admin   amLODipine (NORVASC) tablet 10 mg  10 mg Oral QPM Lurene Shadow, MD   10 mg at 09/01/23 1619   aspirin EC tablet 81 mg  81 mg Oral Daily Lurene Shadow, MD   81 mg at 09/02/23 0934   atorvastatin (LIPITOR) tablet 40 mg  40 mg Oral Daily Lurene Shadow, MD   40 mg at 09/02/23 8413   calcitRIOL (ROCALTROL) capsule 0.25 mcg  0.25 mcg Oral Q T,Th,Sa-HD Mosetta Pigeon, MD   0.25 mcg at 09/01/23 1611   calcium carbonate (TUMS - dosed in mg elemental calcium) chewable tablet 400 mg of elemental calcium  400 mg of elemental calcium Oral BID Lurene Shadow, MD   400 mg of elemental calcium at 09/02/23 0935   Chlorhexidine Gluconate Cloth 2 % PADS 6 each  6 each Topical Q0600 Lurene Shadow, MD   6 each at 09/02/23 0452   cinacalcet (SENSIPAR) tablet 30 mg  30 mg Oral Q supper Lurene Shadow, MD   30 mg at 09/01/23 1611   epoetin alfa (EPOGEN) injection 10,000 Units  10,000 Units Intravenous Q T,Th,Sa-HD Lurene Shadow, MD   10,000 Units at 09/01/23 1023   fluticasone (FLONASE) 50 MCG/ACT nasal  spray 1 spray  1 spray Each Nare Daily Lurene Shadow, MD   1 spray at 09/02/23 0932   gabapentin (NEURONTIN) capsule 200 mg  200 mg Oral BID Lurene Shadow, MD   200 mg at 09/02/23 0934   guaiFENesin-dextromethorphan (ROBITUSSIN DM) 100-10 MG/5ML syrup 15 mL  15 mL Oral Q4H PRN Lurene Shadow, MD       heparin injection 5,000 Units  5,000 Units Subcutaneous Q12H Lurene Shadow, MD   5,000 Units at 09/02/23 1610   hydrALAZINE (APRESOLINE) tablet 100 mg  100 mg Oral TID Lurene Shadow, MD   100 mg at 09/02/23 9604   hydrOXYzine (ATARAX) tablet 25 mg  25 mg Oral TID Lurene Shadow, MD   25 mg at 09/02/23 0934    irbesartan (AVAPRO) tablet 300 mg  300 mg Oral QPM Mosetta Pigeon, MD   300 mg at 09/01/23 1610   isosorbide mononitrate (IMDUR) 24 hr tablet 120 mg  120 mg Oral Daily Lurene Shadow, MD   120 mg at 09/02/23 0935   labetalol (NORMODYNE) tablet 600 mg  600 mg Oral BID Lurene Shadow, MD   600 mg at 09/02/23 0935   ondansetron (ZOFRAN) injection 4 mg  4 mg Intravenous Q6H PRN Lurene Shadow, MD       oxyCODONE (Oxy IR/ROXICODONE) immediate release tablet 5 mg  5 mg Oral Q6H PRN Lurene Shadow, MD   5 mg at 09/02/23 1251   pantoprazole (PROTONIX) EC tablet 40 mg  40 mg Oral Daily Lurene Shadow, MD   40 mg at 09/02/23 0935   senna-docusate (Senokot-S) tablet 2 tablet  2 tablet Oral BID Lurene Shadow, MD   2 tablet at 09/02/23 5409   sevelamer carbonate (RENVELA) tablet 1,600 mg  1,600 mg Oral TID with meals Lurene Shadow, MD   1,600 mg at 09/02/23 1252   terazosin (HYTRIN) capsule 2 mg  2 mg Oral BID Lurene Shadow, MD   2 mg at 09/02/23 8119     Discharge Medications: Please see discharge summary for a list of discharge medications.  Relevant Imaging Results:  Relevant Lab Results:   Additional Information SS#: [id number]. Had been getting HD prior to admission but recently set up at [facility]. TTS 6:00. Started Monday 3/18 at 5:30. Need LTC has Medicaid in place  Marlowe Sax, RN

## 2023-09-02 NOTE — Progress Notes (Signed)
 Physical Therapy Treatment & Discharge  Patient Details Name: Allison Hill MRN: [id number] DOB: 1971-01-21 Today's Date: 09/02/2023   History of Present Illness Pt is a 53 y.o. female with medical history significant of ESRD on HD MWF, refractory HTN, chronic HFpEF, chronic iron deficiency anemia presented with new onset of chest pain, workup for hypertensive emergency.    PT Comments  Patient has been functioning at the same level of supervision during the past 24 day hospital stay. Cues have been provided consistently about appropriate rollator use and foot placement, however patient continues to not correct. Patient is likely at her baseline functionally and cognitively, however cognition unable to be fully assessed due to lack of family present during all sessions. Patient will require 24/7 supervision for safety due to cognitive and mobility deficits. No further skilled PT needs identified acutely as patient is at supervision level and is likely at baseline. Will defer further mobility to nursing staff and mobility specialists. PT will sign off at this time.     If plan is discharge home, recommend the following: A little help with walking and/or transfers;A little help with bathing/dressing/bathroom;Assistance with cooking/housework;Assist for transportation;Help with stairs or ramp for entrance   Can travel by private vehicle     Yes  Equipment Recommendations  None recommended by PT    Recommendations for Other Services       Precautions / Restrictions Precautions Precautions: Fall Recall of Precautions/Restrictions: Impaired Restrictions Weight Bearing Restrictions Per Provider Order: No     Mobility  Bed Mobility               General bed mobility comments: sitting in recliner    Transfers Overall transfer level: Needs assistance Equipment used: Rollator (4 wheels) Transfers: Sit to/from Stand Sit to Stand: Supervision                 Ambulation/Gait Ambulation/Gait assistance: Supervision Gait Distance (Feet): 100 Feet Assistive device: Rollator (4 wheels) Gait Pattern/deviations: Step-through pattern, Decreased stride length (Feet externally rotated) Gait velocity: decreased     General Gait Details: cues to keep feet within rollator. Continues to hit foot on rollator wheel. very wide BOS   Stairs             Wheelchair Mobility     Tilt Bed    Modified Rankin (Stroke Patients Only)       Balance Overall balance assessment: Needs assistance Sitting-balance support: Feet supported Sitting balance-Leahy Scale: Good     Standing balance support: Bilateral upper extremity supported, Reliant on assistive device for balance, During functional activity Standing balance-Leahy Scale: Fair                              Hotel manager: Impaired Factors Affecting Communication: Difficulty expressing self  Cognition Arousal: Alert Behavior During Therapy: WFL for tasks assessed/performed   PT - Cognitive impairments: No family/caregiver present to determine baseline                         Following commands: Intact Following commands impaired: Follows multi-step commands inconsistently, Follows one step commands with increased time    Cueing    Exercises      General Comments        Pertinent Vitals/Pain Pain Assessment Pain Assessment: No/denies pain    Home Living  Prior Function            PT Goals (current goals can now be found in the care plan section) Acute Rehab PT Goals PT Goal Formulation: With patient Potential to Achieve Goals: Poor Progress towards PT goals: Goals met/education completed, patient discharged from PT    Frequency    Min 1X/week      PT Plan      Co-evaluation              AM-PAC PT "6 Clicks" Mobility   Outcome Measure  Help needed turning from  your back to your side while in a flat bed without using bedrails?: None Help needed moving from lying on your back to sitting on the side of a flat bed without using bedrails?: A Little Help needed moving to and from a bed to a chair (including a wheelchair)?: A Little Help needed standing up from a chair using your arms (e.g., wheelchair or bedside chair)?: A Little Help needed to walk in hospital room?: A Little Help needed climbing 3-5 steps with a railing? : A Little 6 Click Score: 19    End of Session   Activity Tolerance: Patient tolerated treatment well Patient left: in chair;with call bell/phone within reach Nurse Communication: Mobility status PT Visit Diagnosis: Other abnormalities of gait and mobility (R26.89);Difficulty in walking, not elsewhere classified (R26.2);Muscle weakness (generalized) (M62.81)     Time: 8413-2440 PT Time Calculation (min) (ACUTE ONLY): 11 min  Charges:    $Therapeutic Activity: 8-22 mins PT General Charges $$ ACUTE PT VISIT: 1 Visit                     [name], PT, DPT Physical Therapist - [facility]  [facility]    Ndidi Nesby A Shreeya Recendiz 09/02/2023, 1:39 PM

## 2023-09-02 NOTE — Progress Notes (Signed)
 Mobility Specialist - Progress Note   09/02/23 1600  Mobility  Activity Ambulated with assistance in hallway;Ambulated with assistance in room;Ambulated with assistance to bathroom;Transferred from chair to bed  Level of Assistance Contact guard assist, steadying assist  Assistive Device Front wheel walker  Distance Ambulated (ft) 100 ft  Activity Response Tolerated well  Mobility visit 1 Mobility     Pt siting in chair, utilizing RA. Pt assisted to bathroom prior to continuation into hallway with CGA-minG. Pt returned to bed with alarm set, needs in reach.    Filiberto Pinks Mobility Specialist 09/02/23, 4:46 PM

## 2023-09-03 DIAGNOSIS — N186 End stage renal disease: Secondary | ICD-10-CM | POA: Diagnosis not present

## 2023-09-03 DIAGNOSIS — R079 Chest pain, unspecified: Secondary | ICD-10-CM | POA: Diagnosis not present

## 2023-09-03 DIAGNOSIS — I160 Hypertensive urgency: Secondary | ICD-10-CM | POA: Diagnosis not present

## 2023-09-03 DIAGNOSIS — I132 Hypertensive heart and chronic kidney disease with heart failure and with stage 5 chronic kidney disease, or end stage renal disease: Secondary | ICD-10-CM | POA: Diagnosis not present

## 2023-09-03 DIAGNOSIS — H6691 Otitis media, unspecified, right ear: Secondary | ICD-10-CM | POA: Diagnosis not present

## 2023-09-03 LAB — CBC
HCT: 25.1 % — ABNORMAL LOW (ref 36.0–46.0)
Hemoglobin: 8.1 g/dL — ABNORMAL LOW (ref 12.0–15.0)
MCH: 26.9 pg (ref 26.0–34.0)
MCHC: 32.3 g/dL (ref 30.0–36.0)
MCV: 83.4 fL (ref 80.0–100.0)
Platelets: 234 10*3/uL (ref 150–400)
RBC: 3.01 MIL/uL — ABNORMAL LOW (ref 3.87–5.11)
RDW: 18.4 % — ABNORMAL HIGH (ref 11.5–15.5)
WBC: 4.6 10*3/uL (ref 4.0–10.5)
nRBC: 0 % (ref 0.0–0.2)

## 2023-09-03 LAB — RENAL FUNCTION PANEL
Albumin: 3.4 g/dL — ABNORMAL LOW (ref 3.5–5.0)
Anion gap: 13 (ref 5–15)
BUN: 96 mg/dL — ABNORMAL HIGH (ref 6–20)
CO2: 25 mmol/L (ref 22–32)
Calcium: 8.7 mg/dL — ABNORMAL LOW (ref 8.9–10.3)
Chloride: 98 mmol/L (ref 98–111)
Creatinine, Ser: 7.99 mg/dL — ABNORMAL HIGH (ref 0.44–1.00)
GFR, Estimated: 6 mL/min — ABNORMAL LOW (ref >60)
Glucose, Bld: 90 mg/dL (ref 70–99)
Phosphorus: 5.1 mg/dL — ABNORMAL HIGH (ref 2.5–4.6)
Potassium: 5.1 mmol/L (ref 3.5–5.1)
Sodium: 136 mmol/L (ref 135–145)

## 2023-09-03 MED ORDER — HEPARIN SODIUM (PORCINE) 1000 UNIT/ML DIALYSIS: 1000.0000 [IU] | INTRAMUSCULAR | Status: DC | PRN

## 2023-09-03 MED ORDER — ALTEPLASE 2 MG IJ SOLR: 2.0000 mg | Freq: Once | INTRAMUSCULAR | Status: DC | PRN

## 2023-09-03 NOTE — Progress Notes (Signed)
 [facility]  ROUNDING NOTE   Subjective:   Patient seen and evaluated during dialysis   HEMODIALYSIS FLOWSHEET:  Blood Flow Rate (mL/min): 400 mL/min Arterial Pressure (mmHg): -182.82 mmHg Venous Pressure (mmHg): 167.46 mmHg TMP (mmHg): 6.66 mmHg Ultrafiltration Rate (mL/min): 828 mL/min Dialysate Flow Rate (mL/min): 300 ml/min Dialysis Fluid Bolus: Normal Saline  Tolerating dialysis seated in char No complaints to offer   Objective:  Vital signs in last 24 hours:  Temp:  [97.7 F (36.5 C)-98.3 F (36.8 C)] 98.3 F (36.8 C) (04/03 0749) Pulse Rate:  [76-88] 76 (04/03 1000) Resp:  [10-26] 15 (04/03 1000) BP: (111-163)/(78-105) 149/95 (04/03 1000) SpO2:  [96 %-100 %] 100 % (04/03 1000) Weight:  [66.7 kg] 66.7 kg (04/03 0749)  Weight change:  Filed Weights   09/01/23 0724 09/01/23 1051 09/03/23 0749  Weight: 66.8 kg 64.7 kg 66.7 kg    Intake/Output: I/O last 3 completed shifts: In: 120 [P.O.:120] Out: -    Intake/Output this shift:  No intake/output data recorded.  Physical Exam: General: NAD  Head: Normocephalic, atraumatic. Moist oral mucosal membranes  Eyes: Anicteric  Lungs:  Clear to auscultation  Heart: Regular rate and rhythm  Abdomen:  Soft, nontender  Extremities: No peripheral edema.  Neurologic: Alert, moving all four extremities  Skin: No lesions  Access: Rt Chest Permcath    Basic Metabolic Panel: Recent Labs  Lab 08/27/23 1220 08/29/23 0948 09/01/23 0737 09/03/23 0809  NA 138 137 136 136  K 5.3* 4.8 5.5* 5.1  CL 98 97* 98 98  CO2 26 26 25 25   GLUCOSE 95 121* 82 90  BUN 97* 72* 92* 96*  CREATININE 7.38* 7.00* 8.31* 7.99*  CALCIUM 7.9* 8.4* 8.1* 8.7*  PHOS 4.7* 4.6 5.4* 5.1*    Liver Function Tests: Recent Labs  Lab 08/27/23 1220 08/29/23 0948 09/01/23 0737 09/03/23 0809  ALBUMIN 3.3* 3.4* 3.4* 3.4*    No results for input(s): "LIPASE", "AMYLASE" in the last 168 hours. No results for input(s): "AMMONIA" in  the last 168 hours.  CBC: Recent Labs  Lab 08/27/23 1220 08/29/23 0948 09/01/23 0737 09/03/23 0809  WBC 8.2 7.5 5.5 4.6  HGB 7.7* 8.3* 7.7* 8.1*  HCT 23.3* 25.8* 24.1* 25.1*  MCV 82.3 85.7 84.9 83.4  PLT 214 210 235 234    Cardiac Enzymes: No results for input(s): "CKTOTAL", "CKMB", "CKMBINDEX", "TROPONINI" in the last 168 hours.  BNP: Invalid input(s): "POCBNP"  CBG: No results for input(s): "GLUCAP" in the last 168 hours.  Microbiology: Results for orders placed or performed during the hospital encounter of 08/09/23  Resp panel by RT-PCR (RSV, Flu A&B, Covid) Anterior Nasal Swab     Status: None   Collection Time: 08/14/23  3:44 AM   Specimen: Anterior Nasal Swab  Result Value Ref Range Status   SARS Coronavirus 2 by RT PCR NEGATIVE NEGATIVE Final    Comment: (NOTE) SARS-CoV-2 target nucleic acids are NOT DETECTED.  The SARS-CoV-2 RNA is generally detectable in upper respiratory specimens during the acute phase of infection. The lowest concentration of SARS-CoV-2 viral copies this assay can detect is 138 copies/mL. A negative result does not preclude SARS-Cov-2 infection and should not be used as the sole basis for treatment or other patient management decisions. A negative result may occur with  improper specimen collection/handling, submission of specimen other than nasopharyngeal swab, presence of viral mutation(s) within the areas targeted by this assay, and inadequate number of viral copies(<138 copies/mL). A negative result must be  combined with clinical observations, patient history, and epidemiological information. The expected result is Negative.  Fact Sheet for Patients:  BloggerCourse.com  Fact Sheet for Healthcare Providers:  SeriousBroker.it  This test is no t yet approved or cleared by the [place] FDA and  has been authorized for detection and/or diagnosis of SARS-CoV-2 by FDA under an  Emergency Use Authorization (EUA). This EUA will remain  in effect (meaning this test can be used) for the duration of the COVID-19 declaration under Section 564(b)(1) of the Act, 21 U.S.C.section 360bbb-3(b)(1), unless the authorization is terminated  or revoked sooner.       Influenza A by PCR NEGATIVE NEGATIVE Final   Influenza B by PCR NEGATIVE NEGATIVE Final    Comment: (NOTE) The Xpert Xpress SARS-CoV-2/FLU/RSV plus assay is intended as an aid in the diagnosis of influenza from Nasopharyngeal swab specimens and should not be used as a sole basis for treatment. Nasal washings and aspirates are unacceptable for Xpert Xpress SARS-CoV-2/FLU/RSV testing.  Fact Sheet for Patients: BloggerCourse.com  Fact Sheet for Healthcare Providers: SeriousBroker.it  This test is not yet approved or cleared by the [place] FDA and has been authorized for detection and/or diagnosis of SARS-CoV-2 by FDA under an Emergency Use Authorization (EUA). This EUA will remain in effect (meaning this test can be used) for the duration of the COVID-19 declaration under Section 564(b)(1) of the Act, 21 U.S.C. section 360bbb-3(b)(1), unless the authorization is terminated or revoked.     Resp Syncytial Virus by PCR NEGATIVE NEGATIVE Final    Comment: (NOTE) Fact Sheet for Patients: BloggerCourse.com  Fact Sheet for Healthcare Providers: SeriousBroker.it  This test is not yet approved or cleared by the [place] FDA and has been authorized for detection and/or diagnosis of SARS-CoV-2 by FDA under an Emergency Use Authorization (EUA). This EUA will remain in effect (meaning this test can be used) for the duration of the COVID-19 declaration under Section 564(b)(1) of the Act, 21 U.S.C. section 360bbb-3(b)(1), unless the authorization is terminated or revoked.  Performed at [facility], [address] [place], [place] [address]   Respiratory (~20 pathogens) panel by PCR     Status: None   Collection Time: 08/14/23  3:44 AM   Specimen: Nasopharyngeal Swab; Respiratory  Result Value Ref Range Status   Adenovirus NOT DETECTED NOT DETECTED Final   Coronavirus 229E NOT DETECTED NOT DETECTED Final    Comment: (NOTE) The Coronavirus on the Respiratory Panel, DOES NOT test for the novel  Coronavirus (2019 nCoV)    Coronavirus HKU1 NOT DETECTED NOT DETECTED Final   Coronavirus NL63 NOT DETECTED NOT DETECTED Final   Coronavirus OC43 NOT DETECTED NOT DETECTED Final   Metapneumovirus NOT DETECTED NOT DETECTED Final   Rhinovirus / Enterovirus NOT DETECTED NOT DETECTED Final   Influenza A NOT DETECTED NOT DETECTED Final   Influenza B NOT DETECTED NOT DETECTED Final   Parainfluenza Virus 1 NOT DETECTED NOT DETECTED Final   Parainfluenza Virus 2 NOT DETECTED NOT DETECTED Final   Parainfluenza Virus 3 NOT DETECTED NOT DETECTED Final   Parainfluenza Virus 4 NOT DETECTED NOT DETECTED Final   Respiratory Syncytial Virus NOT DETECTED NOT DETECTED Final   Bordetella pertussis NOT DETECTED NOT DETECTED Final   Bordetella Parapertussis NOT DETECTED NOT DETECTED Final   Chlamydophila pneumoniae NOT DETECTED NOT DETECTED Final   Mycoplasma pneumoniae NOT DETECTED NOT DETECTED Final    Comment: Performed at [facility], [address] [place],  [place] [address]  Culture, blood (Routine X 2) w Reflex to ID Panel     Status: None   Collection Time: 08/14/23  9:19 AM   Specimen: BLOOD  Result Value Ref Range Status   Specimen Description BLOOD BLOOD RIGHT HAND  Final   Special Requests   Final    BOTTLES DRAWN AEROBIC AND ANAEROBIC Blood Culture adequate volume   Culture   Final    NO GROWTH 5 DAYS Performed at [facility], [address] [place], [place] [address]    Report Status 08/19/2023 FINAL  Final  Culture, blood (Routine X 2) w Reflex to ID Panel      Status: None   Collection Time: 08/14/23  9:19 AM   Specimen: BLOOD  Result Value Ref Range Status   Specimen Description BLOOD BLOOD LEFT HAND  Final   Special Requests   Final    BOTTLES DRAWN AEROBIC AND ANAEROBIC Blood Culture adequate volume   Culture   Final    NO GROWTH 5 DAYS Performed at [facility], [address] [place], [place] [address]    Report Status 08/19/2023 FINAL  Final  Ear culture     Status: None   Collection Time: 08/14/23 10:11 AM   Specimen: Ear; Other  Result Value Ref Range Status   Specimen Description   Final    EAR Performed at [facility], [address] [place], [place] [address]    Special Requests   Final    RIGHT EAR Performed at [facility], [address] [place], [place] [address]    Culture RARE STREPTOCOCCUS PNEUMONIAE  Final   Report Status 08/16/2023 FINAL  Final   Organism ID, Bacteria STREPTOCOCCUS PNEUMONIAE  Final      Susceptibility   Streptococcus pneumoniae - MIC*    ERYTHROMYCIN <=0.12 SENSITIVE Sensitive     LEVOFLOXACIN 1 SENSITIVE Sensitive     VANCOMYCIN 0.5 SENSITIVE Sensitive     PENICILLIN (meningitis) <=0.06 SENSITIVE Sensitive     PENO - penicillin <=0.06      PENICILLIN (non-meningitis) <=0.06 SENSITIVE Sensitive     PENICILLIN (oral) <=0.06 SENSITIVE Sensitive     CEFTRIAXONE (non-meningitis) <=0.12 SENSITIVE Sensitive     CEFTRIAXONE (meningitis) <=0.12 SENSITIVE Sensitive     * RARE STREPTOCOCCUS PNEUMONIAE    Coagulation Studies: No results for input(s): "LABPROT", "INR" in the last 72 hours.  Urinalysis: No results for input(s): "COLORURINE", "LABSPEC", "PHURINE", "GLUCOSEU", "HGBUR", "BILIRUBINUR", "KETONESUR", "PROTEINUR", "UROBILINOGEN", "NITRITE", "LEUKOCYTESUR" in the last 72 hours.  Invalid input(s): "APPERANCEUR"    Imaging: No results found.    Medications:     amLODipine  10 mg Oral QPM   aspirin EC  81 mg Oral Daily   atorvastatin  40 mg Oral  Daily   calcitRIOL  0.25 mcg Oral Q T,Th,Sa-HD   calcium carbonate  400 mg of elemental calcium Oral BID   Chlorhexidine Gluconate Cloth  6 each Topical Q0600   cinacalcet  30 mg Oral Q supper   epoetin alfa  10,000 Units Intravenous Q T,Th,Sa-HD   fluticasone  1 spray Each Nare Daily   gabapentin  200 mg Oral BID   heparin  5,000 Units Subcutaneous Q12H   hydrALAZINE  100 mg Oral TID   hydrOXYzine  25 mg Oral TID   irbesartan  300 mg Oral QPM   isosorbide mononitrate  120 mg Oral Daily   labetalol  600 mg Oral BID   pantoprazole  40 mg  Oral Daily   senna-docusate  2 tablet Oral BID   sevelamer carbonate  1,600 mg Oral TID with meals   terazosin  2 mg Oral BID   guaiFENesin-dextromethorphan, ondansetron (ZOFRAN) IV, oxyCODONE  Assessment/ Plan:  Allison Hill is a 53 y.o.  female  with past medical conditions including hypertension, CHF, anemia, and end-stage renal disease on hemodialysis, who was admitted to [facility] on 08/09/2023 for Hypertensive urgency [I16.0] Chest pain [R07.9] Nonspecific chest pain [R07.9] Anemia due to chronic kidney disease, on chronic dialysis (HCC) [N18.6, D63.1, Z99.2]  Outpatient dialysis clinic has been arranged at [facility] on a TTS schedule, chair time at 530 am.    1.  Hypertensive urgency, on admission Amlodipine restarted.   Blood pressure 147/88 during dialysis  2. Anemia of chronic kidney disease Hemoglobin & Hematocrit     Component Value Date/Time   HGB 8.1 (L) 09/03/2023 0809   HCT 25.1 (L) 09/03/2023 0809  Hemoglobin below optimal range.  Will continue IV EPO with  dialysis treatment.   3.  End stage renal disease on hemodialysis.  Dialysis today with UF 2L, as tolerated. Next treatment scheduled for Saturday.    4. Secondary Hyperparathyroidism:   Lab Results  Component Value Date   PTH 1,248 (H) 08/11/2023   CALCIUM 8.7 (L) 09/03/2023   PHOS 5.1 (H) 09/03/2023  Will continue to monitor bone minerals.   Currently  prescribed calcium carbonate, sevelamer and Cinacalcet. Continue calcitriol as prescribed.    LOS: 0 Chinmayi Rumer 4/3/202510:24 AM

## 2023-09-03 NOTE — TOC Progression Note (Signed)
 Transition of Care [facility]) - Progression Note    Patient Details  Name: Allison Hill MRN: [id number] Date of Birth: [birth year]  [facility] CM/SW Contact  Marlowe Sax, RN Phone Number: 09/03/2023, 1:34 PM  Clinical Narrative:    Sent Dialysis Flow sheet to [facility] coordinator there request   Expected Discharge Plan:  (TBD) Barriers to Discharge: Continued Medical Work up  Expected Discharge Plan and Services     Post Acute Care Choice:  (TBD) Living arrangements for the past 2 months: Single Family Home                                       Social Determinants of Health (SDOH) Interventions SDOH Screenings   Food Insecurity: No Food Insecurity (08/10/2023)  Recent Concern: Food Insecurity - High Risk (07/17/2023)   Received from [facility]  Housing: Low Risk  (08/10/2023)  Recent Concern: Housing - High Risk (07/22/2023)   Received from [facility]  Transportation Needs: Unmet Transportation Needs (08/10/2023)  Utilities: Not At Risk (08/10/2023)  Recent Concern: Utilities - Medium Risk (07/17/2023)   Received from [facility]  Financial Resource Strain: Patient Declined (01/03/2023)   Received from [facility]  Social Connections: Unknown (08/10/2023)  Tobacco Use: High Risk (08/10/2023)    Readmission Risk Interventions     No data to display

## 2023-09-03 NOTE — Progress Notes (Signed)
 Received patient in bed to unit.  Alert and oriented.  Informed consent signed and in chart.   TX duration: 3.5  Patient tolerated well.  Transported back to the room  Alert, without acute distress.  Hand-off given to patient's nurse.   Access used: Right chest [facility] Access issues: None  Total UF removed: 2000 ml Medication(s) given: See [facility]   09/03/23 1150  Vitals  Temp 98.2 F (36.8 C)  Pulse Rate 76  Resp 15  BP (!) 160/89  O2 Device Room Air  Weight 64.7 kg  Type of Weight Post-Dialysis  Oxygen Therapy  Patient Activity (if Appropriate) In chair  Pulse Oximetry Type Continuous  Post Treatment  Dialyzer Clearance Clear  Liters Processed 76.6  Fluid Removed (mL) 2000 mL  Tolerated HD Treatment Yes

## 2023-09-03 NOTE — Progress Notes (Signed)
 Occupational Therapy Treatment Patient Details Name: Allison Hill MRN: [id number] DOB: [birth year] Today's Date: 09/03/2023   History of present illness Pt is a 53 y.o. female with medical history significant of ESRD on HD MWF, refractory HTN, chronic HFpEF, chronic iron deficiency anemia presented with new onset of chest pain, workup for hypertensive emergency.   OT comments  Ms Derocher was seen for OT treatment on this date. Upon arrival to room pt in bed, reports fatigue from HD but agreeable to clean linen change. Pt requires SUPERVISION don/doff underwear and gown in standing - noted to brace BLE against bed for standing portion. SBA sit<>stand x3, clean linen change provided. Pt making progress toward goals, will continue to follow POC. Discharge recommendation remains appropriate.        If plan is discharge home, recommend the following:  A little help with walking and/or transfers;A little help with bathing/dressing/bathroom;Assistance with cooking/housework;Assistance with feeding;Direct supervision/assist for medications management;Direct supervision/assist for financial management;Assist for transportation;Help with stairs or ramp for entrance;Supervision due to cognitive status   Equipment Recommendations  None recommended by OT    Recommendations for Other Services      Precautions / Restrictions Precautions Precautions: Fall Recall of Precautions/Restrictions: Impaired Restrictions Weight Bearing Restrictions Per Provider Order: No       Mobility Bed Mobility Overal bed mobility: Modified Independent                  Transfers Overall transfer level: Needs assistance Equipment used: None Transfers: Sit to/from Stand Sit to Stand: Supervision           General transfer comment: x3     Balance Overall balance assessment: Needs assistance Sitting-balance support: Feet supported Sitting balance-Leahy Scale: Normal     Standing balance support:  No upper extremity supported, During functional activity Standing balance-Leahy Scale: Fair                             ADL either performed or assessed with clinical judgement   ADL Overall ADL's : Needs assistance/impaired                                       General ADL Comments: SUPERVISION don/doff underwear and gown - noted to brace BLE against bed.     Communication Communication Communication: Impaired   Cognition Arousal: Alert Behavior During Therapy: WFL for tasks assessed/performed Cognition: No family/caregiver present to determine baseline                               Following commands: Intact        Cueing   Cueing Techniques: Verbal cues, Tactile cues  Exercises      Shoulder Instructions       General Comments      Pertinent Vitals/ Pain       Pain Assessment Pain Assessment: No/denies pain   Frequency  Min 2X/week        Progress Toward Goals  OT Goals(current goals can now be found in the care plan section)  Progress towards OT goals: Progressing toward goals  Acute Rehab OT Goals OT Goal Formulation: With patient Time For Goal Achievement: 09/08/23 Potential to Achieve Goals: Good ADL Goals Pt Will Perform Grooming: with modified independence;standing Pt Will Perform Upper Body Bathing: with modified  independence;sitting;standing Pt Will Perform Lower Body Dressing: with modified independence;sitting/lateral leans;sit to/from stand Pt Will Transfer to Toilet: with modified independence;ambulating;grab bars;regular height toilet Pt Will Perform Toileting - Clothing Manipulation and hygiene: with modified independence;sit to/from stand;sitting/lateral leans  Plan      Co-evaluation                 AM-PAC OT "6 Clicks" Daily Activity     Outcome Measure   Help from another person eating meals?: None Help from another person taking care of personal grooming?: None Help from  another person toileting, which includes using toliet, bedpan, or urinal?: A Little Help from another person bathing (including washing, rinsing, drying)?: A Little Help from another person to put on and taking off regular upper body clothing?: None Help from another person to put on and taking off regular lower body clothing?: None 6 Click Score: 22    End of Session    OT Visit Diagnosis: Unsteadiness on feet (R26.81);Other abnormalities of gait and mobility (R26.89);Muscle weakness (generalized) (M62.81)   Activity Tolerance Patient limited by fatigue   Patient Left in bed;with call bell/phone within reach;with bed alarm set   Nurse Communication          Time: 8101-7510 OT Time Calculation (min): 9 min  Charges: OT General Charges $OT Visit: 1 Visit OT Treatments $Self Care/Home Management : 8-22 mins  Kathie Dike, M.S. OTR/L  09/03/23, 3:18 PM  ascom [phone]

## 2023-09-03 NOTE — Progress Notes (Signed)
 Progress Note    Allison Hill  VWU:[id number] DOB: 11-Jul-1970  DOA: 08/09/2023 PCP: Melvenia Beam, MD      Brief Narrative:    Medical records reviewed and are as summarized below:  Allison Hill is a 53 y.o. female with medical history significant of ESRD on HD MWF, refractory HTN, chronic HFpEF, chronic iron deficiency anemia admitted to hospitalist service for evaluation of chest pain, hypertensive urgency.  Patient's chest pain atypical, troponins negative.  Blood pressure improved with home regimen.  Patient lives with her daughter who does not want to take her home given high risk for falls, not safe for her HD transportation.  HD outpatient facility arranged.  TOC working on placement   On 08/13/2023/08/14/2023 patient is complaining of severe pain in her right ear that is not controlled by tylenol. The pain has not improved with IV Unasyn. CT head demonstrated mastoiditis and otitis media with swelling of the external canal. The patient was given IV Fentanyl for pain and antibiotics were expanded to include cipro PO. She will undergo dialysis on Saturday as part of her usual schedule.  PT/OT recommended nursing home placement, however insurance company denied the application.  4/2: Remained medically stable.  Difficult disposition as daughter does not want her coming back to her home.  TOC is working on it.  4/3: Had her HD today.  Awaiting placement   Assessment/Plan:   Principal Problem:   Nonspecific chest pain Active Problems:   Otitis media not resolved, right   Hypertensive urgency   ESRD on dialysis (HCC)   Right upper quadrant abdominal pain    Body mass index is 25.27 kg/m.   Right otitis media Resolved. Completed 7 days of antibiotics on 08/20/2023 (including IV Unasyn followed by ciprofloxacin and Levaquin).    Hypertensive urgency Orthostatic hypotension. Fluctuating BP.  BP  significantly elevated.  Increase amlodipine again from 5  to 10 mg daily.   Continue amlodipine, labetalol, losartan, terazosin and hydralazine.    ESRD on dialysis (HCC) Hyperkalemia Hypocalcemia Anemia of chronic kidney disease Plan for hemodialysis today Continue Epogen, Cinacalcet, sevelamer and calcium carbonate Follow-up with nephrologist for hemodialysis  Chest pain Resolved   Right upper quadrant abdominal pain Improved.  Continue Protonix US of the RUQ of the abdomen is negative for cholecystitis or cholelithiasis.     General weakness No safe discharge plan.  Awaiting placement to long-term care facility Follow-up with transition of care team to assist with disposition  Analgesics as needed for shoulder osteoarthritis  Patient has been informed that insurance authorization for SNF was denied.  She said she has nowhere to go.  She is going to discuss this further with her daughter and come up with a plan.  Discussed with Deliliah, case Production designer, theatre/television/film.  She said patient may have to pay out-of-pocket to get to SNF.  Follow-up with TOC to assist with disposition.   Diet Order             Diet Heart Room service appropriate? Yes; Fluid consistency: Thin  Diet effective now                  Consultants: Nephrologist  Procedures: None   Medications:    amLODipine  10 mg Oral QPM   aspirin EC  81 mg Oral Daily   atorvastatin  40 mg Oral Daily   calcitRIOL  0.25 mcg Oral Q T,Th,Sa-HD   calcium carbonate  400 mg of elemental calcium Oral BID  Chlorhexidine Gluconate Cloth  6 each Topical Q0600   cinacalcet  30 mg Oral Q supper   epoetin alfa  10,000 Units Intravenous Q T,Th,Sa-HD   fluticasone  1 spray Each Nare Daily   gabapentin  200 mg Oral BID   heparin  5,000 Units Subcutaneous Q12H   hydrALAZINE  100 mg Oral TID   hydrOXYzine  25 mg Oral TID   irbesartan  300 mg Oral QPM   isosorbide mononitrate  120 mg Oral Daily   labetalol  600 mg Oral BID   pantoprazole  40 mg Oral Daily   senna-docusate  2 tablet Oral  BID   sevelamer carbonate  1,600 mg Oral TID with meals   terazosin  2 mg Oral BID   Continuous Infusions:   Anti-infectives (From admission, onward)    Start     Dose/Rate Route Frequency Ordered Stop   08/19/23 1800  levofloxacin (LEVAQUIN) tablet 250 mg        250 mg Oral Every evening 08/19/23 1504 08/20/23 1739   08/15/23 1800  ciprofloxacin (CIPRO) tablet 500 mg  Status:  Discontinued        500 mg Oral Every evening 08/14/23 0856 08/19/23 1502   08/14/23 0915  ciprofloxacin (CIPRO) tablet 500 mg        500 mg Oral  Once 08/14/23 0856 08/14/23 1001   08/14/23 0615  Ampicillin-Sulbactam (UNASYN) 3 g in sodium chloride 0.9 % 100 mL IVPB  Status:  Discontinued        3 g 200 mL/hr over 30 Minutes Intravenous Every 12 hours 08/14/23 0528 08/16/23 1427        Family Communication/Anticipated D/C date and plan/Code Status   DVT prophylaxis: heparin injection 5,000 Units Start: 08/09/23 1000     Code Status: Full Code  Family Communication: None Disposition Plan: Plan to discharge to SNF, likely to a LTC   Status is: Observation The patient will require care spanning > 2 midnights and should be moved to inpatient because: No safe discharge plan   Subjective:  Patient was seen at dialysis, she was feeling tired.  Objective:    Vitals:   09/03/23 1030 09/03/23 1100 09/03/23 1130 09/03/23 1150  BP: (!) 135/90 (!) 143/96 (!) 151/98 (!) 160/89  Pulse: 77 74 76 78  Resp: 10 14 13 14   Temp:    98.2 F (36.8 C)  TempSrc:      SpO2: 100% 100% 100% 100%  Weight:    64.7 kg  Height:       No data found.   Intake/Output Summary (Last 24 hours) at 09/03/2023 1305 Last data filed at 09/03/2023 1150 Gross per 24 hour  Intake --  Output 2000 ml  Net -2000 ml   Filed Weights   09/01/23 1051 09/03/23 0749 09/03/23 1150  Weight: 64.7 kg 66.7 kg 64.7 kg    Exam: General.  Well-developed lady, in no acute distress. Pulmonary.  Lungs clear bilaterally, normal  respiratory effort. CV.  Regular rate and rhythm, no JVD, rub or murmur. Abdomen.  Soft, nontender, nondistended, BS positive. CNS.  Alert and oriented .  No focal neurologic deficit. Extremities.  No edema, no cyanosis, pulses intact and symmetrical. Psychiatry.  Judgment and insight appears normal.     Data Reviewed:   I have personally reviewed following labs and imaging studies:  Labs: Labs show the following:   Basic Metabolic Panel: Recent Labs  Lab 08/29/23 0948 09/01/23 0737 09/03/23 0809  NA 137 136  136  K 4.8 5.5* 5.1  CL 97* 98 98  CO2 26 25 25   GLUCOSE 121* 82 90  BUN 72* 92* 96*  CREATININE 7.00* 8.31* 7.99*  CALCIUM 8.4* 8.1* 8.7*  PHOS 4.6 5.4* 5.1*   GFR Estimated Creatinine Clearance: 7.5 mL/min (A) (by C-G formula based on SCr of 7.99 mg/dL (H)). Liver Function Tests: Recent Labs  Lab 08/29/23 0948 09/01/23 0737 09/03/23 0809  ALBUMIN 3.4* 3.4* 3.4*   No results for input(s): "LIPASE", "AMYLASE" in the last 168 hours. No results for input(s): "AMMONIA" in the last 168 hours. Coagulation profile No results for input(s): "INR", "PROTIME" in the last 168 hours.  CBC: Recent Labs  Lab 08/29/23 0948 09/01/23 0737 09/03/23 0809  WBC 7.5 5.5 4.6  HGB 8.3* 7.7* 8.1*  HCT 25.8* 24.1* 25.1*  MCV 85.7 84.9 83.4  PLT 210 235 234   Cardiac Enzymes: No results for input(s): "CKTOTAL", "CKMB", "CKMBINDEX", "TROPONINI" in the last 168 hours. BNP (last 3 results) No results for input(s): "PROBNP" in the last 8760 hours. CBG: No results for input(s): "GLUCAP" in the last 168 hours. D-Dimer: No results for input(s): "DDIMER" in the last 72 hours. Hgb A1c: No results for input(s): "HGBA1C" in the last 72 hours. Lipid Profile: No results for input(s): "CHOL", "HDL", "LDLCALC", "TRIG", "CHOLHDL", "LDLDIRECT" in the last 72 hours. Thyroid function studies: No results for input(s): "TSH", "T4TOTAL", "T3FREE", "THYROIDAB" in the last 72  hours.  Invalid input(s): "FREET3" Anemia work up: No results for input(s): "VITAMINB12", "FOLATE", "FERRITIN", "TIBC", "IRON", "RETICCTPCT" in the last 72 hours. Sepsis Labs: Recent Labs  Lab 08/29/23 0948 09/01/23 0737 09/03/23 0809  WBC 7.5 5.5 4.6    Microbiology No results found for this or any previous visit (from the past 240 hours).  Procedures and diagnostic studies:  No results found.    LOS: 0 days   Arnetha Courser MD  [facility]   Pager on [email or web address]. If 7PM-7AM, please contact night-coverage at [email or web address]  09/03/2023, 1:05 PM

## 2023-09-04 DIAGNOSIS — I132 Hypertensive heart and chronic kidney disease with heart failure and with stage 5 chronic kidney disease, or end stage renal disease: Secondary | ICD-10-CM | POA: Diagnosis not present

## 2023-09-04 DIAGNOSIS — R079 Chest pain, unspecified: Secondary | ICD-10-CM | POA: Diagnosis not present

## 2023-09-04 DIAGNOSIS — H6691 Otitis media, unspecified, right ear: Secondary | ICD-10-CM | POA: Diagnosis not present

## 2023-09-04 DIAGNOSIS — N186 End stage renal disease: Secondary | ICD-10-CM | POA: Diagnosis not present

## 2023-09-04 DIAGNOSIS — I160 Hypertensive urgency: Secondary | ICD-10-CM | POA: Diagnosis not present

## 2023-09-04 NOTE — Progress Notes (Signed)
 Mobility Specialist - Progress Note   09/04/23 1000  Mobility  Activity Refused mobility     Pt sleeping on arrival, awakened by voice. Pt request to return this afternoon for OOB activity. Will attempt another date/time.    Allison Hill Mobility Specialist 09/04/23, 10:43 AM

## 2023-09-04 NOTE — Progress Notes (Signed)
 Progress Note    Allison Hill  VWU:[id number] DOB: Jul 31, 1970  DOA: 08/09/2023 PCP: Melvenia Beam, MD      Brief Narrative:    Medical records reviewed and are as summarized below:  Allison Hill is a 53 y.o. female with medical history significant of ESRD on HD MWF, refractory HTN, chronic HFpEF, chronic iron deficiency anemia admitted to hospitalist service for evaluation of chest pain, hypertensive urgency.  Patient's chest pain atypical, troponins negative.  Blood pressure improved with home regimen.  Patient lives with her daughter who does not want to take her home given high risk for falls, not safe for her HD transportation.  HD outpatient facility arranged.  TOC working on placement   On 08/13/2023/08/14/2023 patient is complaining of severe pain in her right ear that is not controlled by tylenol. The pain has not improved with IV Unasyn. CT head demonstrated mastoiditis and otitis media with swelling of the external canal. The patient was given IV Fentanyl for pain and antibiotics were expanded to include cipro PO. She will undergo dialysis on Saturday as part of her usual schedule.  PT/OT recommended nursing home placement, however insurance company denied the application.  4/2: Remained medically stable.  Difficult disposition as daughter does not want her coming back to her home.  TOC is working on it.  4/3: Had her HD today.  Awaiting placement   Assessment/Plan:   Principal Problem:   Nonspecific chest pain Active Problems:   Otitis media not resolved, right   Hypertensive urgency   ESRD on dialysis (HCC)   Right upper quadrant abdominal pain    Body mass index is 25.27 kg/m.   Right otitis media Resolved. Completed 7 days of antibiotics on 08/20/2023 (including IV Unasyn followed by ciprofloxacin and Levaquin).    Hypertensive urgency Orthostatic hypotension. Fluctuating BP.  BP  significantly elevated.  Increase amlodipine again from 5  to 10 mg daily.   Continue amlodipine, labetalol, losartan, terazosin and hydralazine.    ESRD on dialysis (HCC) Hyperkalemia Hypocalcemia Anemia of chronic kidney disease Plan for hemodialysis today Continue Epogen, Cinacalcet, sevelamer and calcium carbonate Follow-up with nephrologist for hemodialysis  Chest pain Resolved   Right upper quadrant abdominal pain Improved.  Continue Protonix US of the RUQ of the abdomen is negative for cholecystitis or cholelithiasis.     General weakness No safe discharge plan.  Awaiting placement to long-term care facility Follow-up with transition of care team to assist with disposition  Analgesics as needed for shoulder osteoarthritis  Patient has been informed that insurance authorization for SNF was denied.  She said she has nowhere to go.  She is going to discuss this further with her daughter and come up with a plan.  Discussed with Deliliah, case Production designer, theatre/television/film.  She said patient may have to pay out-of-pocket to get to SNF.  Follow-up with TOC to assist with disposition.   Diet Order             Diet Heart Room service appropriate? Yes; Fluid consistency: Thin  Diet effective now                  Consultants: Nephrologist  Procedures: None   Medications:    amLODipine  10 mg Oral QPM   aspirin EC  81 mg Oral Daily   atorvastatin  40 mg Oral Daily   calcitRIOL  0.25 mcg Oral Q T,Th,Sa-HD   calcium carbonate  400 mg of elemental calcium Oral BID  Chlorhexidine Gluconate Cloth  6 each Topical Q0600   cinacalcet  30 mg Oral Q supper   epoetin alfa  10,000 Units Intravenous Q T,Th,Sa-HD   fluticasone  1 spray Each Nare Daily   gabapentin  200 mg Oral BID   heparin  5,000 Units Subcutaneous Q12H   hydrALAZINE  100 mg Oral TID   hydrOXYzine  25 mg Oral TID   irbesartan  300 mg Oral QPM   isosorbide mononitrate  120 mg Oral Daily   labetalol  600 mg Oral BID   pantoprazole  40 mg Oral Daily   senna-docusate  2 tablet Oral  BID   sevelamer carbonate  1,600 mg Oral TID with meals   terazosin  2 mg Oral BID   Continuous Infusions:   Anti-infectives (From admission, onward)    Start     Dose/Rate Route Frequency Ordered Stop   08/19/23 1800  levofloxacin (LEVAQUIN) tablet 250 mg        250 mg Oral Every evening 08/19/23 1504 08/20/23 1739   08/15/23 1800  ciprofloxacin (CIPRO) tablet 500 mg  Status:  Discontinued        500 mg Oral Every evening 08/14/23 0856 08/19/23 1502   08/14/23 0915  ciprofloxacin (CIPRO) tablet 500 mg        500 mg Oral  Once 08/14/23 0856 08/14/23 1001   08/14/23 0615  Ampicillin-Sulbactam (UNASYN) 3 g in sodium chloride 0.9 % 100 mL IVPB  Status:  Discontinued        3 g 200 mL/hr over 30 Minutes Intravenous Every 12 hours 08/14/23 0528 08/16/23 1427        Family Communication/Anticipated D/C date and plan/Code Status   DVT prophylaxis: heparin injection 5,000 Units Start: 08/09/23 1000     Code Status: Full Code  Family Communication: None Disposition Plan: Plan to discharge to SNF, likely to a LTC   Status is: Observation The patient will require care spanning > 2 midnights and should be moved to inpatient because: No safe discharge plan   Subjective:  Patient was resting comfortably when seen today.  No new concern  Objective:    Vitals:   09/03/23 2045 09/04/23 0423 09/04/23 0813 09/04/23 1609  BP: 117/77 129/79 (!) 145/87 115/76  Pulse: 89 77 83 86  Resp: 18 16 16 17   Temp: 98.7 F (37.1 C) 98.4 F (36.9 C) 98 F (36.7 C) 98.6 F (37 C)  TempSrc: Oral Oral  Oral  SpO2: 100% 99% 100% 100%  Weight:      Height:       No data found.   Intake/Output Summary (Last 24 hours) at 09/04/2023 1755 Last data filed at 09/04/2023 1051 Gross per 24 hour  Intake 0 ml  Output --  Net 0 ml   Filed Weights   09/01/23 1051 09/03/23 0749 09/03/23 1150  Weight: 64.7 kg 66.7 kg 64.7 kg    Exam: General.  Well-developed lady, in no acute distress. Pulmonary.   Lungs clear bilaterally, normal respiratory effort. CV.  Regular rate and rhythm, no JVD, rub or murmur. Abdomen.  Soft, nontender, nondistended, BS positive. CNS.  Little somnolent.  No focal neurologic deficit. Extremities.  No edema, no cyanosis, pulses intact and symmetrical. Psychiatry.  Judgment and insight appears normal.   Data Reviewed:   I have personally reviewed following labs and imaging studies:  Labs: Labs show the following:   Basic Metabolic Panel: Recent Labs  Lab 08/29/23 0948 09/01/23 0737 09/03/23 0809  NA 137 136 136  K 4.8 5.5* 5.1  CL 97* 98 98  CO2 26 25 25   GLUCOSE 121* 82 90  BUN 72* 92* 96*  CREATININE 7.00* 8.31* 7.99*  CALCIUM 8.4* 8.1* 8.7*  PHOS 4.6 5.4* 5.1*   GFR Estimated Creatinine Clearance: 7.5 mL/min (A) (by C-G formula based on SCr of 7.99 mg/dL (H)). Liver Function Tests: Recent Labs  Lab 08/29/23 0948 09/01/23 0737 09/03/23 0809  ALBUMIN 3.4* 3.4* 3.4*   No results for input(s): "LIPASE", "AMYLASE" in the last 168 hours. No results for input(s): "AMMONIA" in the last 168 hours. Coagulation profile No results for input(s): "INR", "PROTIME" in the last 168 hours.  CBC: Recent Labs  Lab 08/29/23 0948 09/01/23 0737 09/03/23 0809  WBC 7.5 5.5 4.6  HGB 8.3* 7.7* 8.1*  HCT 25.8* 24.1* 25.1*  MCV 85.7 84.9 83.4  PLT 210 235 234   Cardiac Enzymes: No results for input(s): "CKTOTAL", "CKMB", "CKMBINDEX", "TROPONINI" in the last 168 hours. BNP (last 3 results) No results for input(s): "PROBNP" in the last 8760 hours. CBG: No results for input(s): "GLUCAP" in the last 168 hours. D-Dimer: No results for input(s): "DDIMER" in the last 72 hours. Hgb A1c: No results for input(s): "HGBA1C" in the last 72 hours. Lipid Profile: No results for input(s): "CHOL", "HDL", "LDLCALC", "TRIG", "CHOLHDL", "LDLDIRECT" in the last 72 hours. Thyroid function studies: No results for input(s): "TSH", "T4TOTAL", "T3FREE", "THYROIDAB" in  the last 72 hours.  Invalid input(s): "FREET3" Anemia work up: No results for input(s): "VITAMINB12", "FOLATE", "FERRITIN", "TIBC", "IRON", "RETICCTPCT" in the last 72 hours. Sepsis Labs: Recent Labs  Lab 08/29/23 0948 09/01/23 0737 09/03/23 0809  WBC 7.5 5.5 4.6    Microbiology No results found for this or any previous visit (from the past 240 hours).  Procedures and diagnostic studies:  No results found.    LOS: 0 days   Arnetha Courser MD  [facility]   Pager on [email or web address]. If 7PM-7AM, please contact night-coverage at [email or web address]  09/04/2023, 5:55 PM

## 2023-09-05 DIAGNOSIS — N186 End stage renal disease: Secondary | ICD-10-CM | POA: Diagnosis not present

## 2023-09-05 DIAGNOSIS — I132 Hypertensive heart and chronic kidney disease with heart failure and with stage 5 chronic kidney disease, or end stage renal disease: Secondary | ICD-10-CM | POA: Diagnosis not present

## 2023-09-05 DIAGNOSIS — I160 Hypertensive urgency: Secondary | ICD-10-CM | POA: Diagnosis not present

## 2023-09-05 DIAGNOSIS — R079 Chest pain, unspecified: Secondary | ICD-10-CM | POA: Diagnosis not present

## 2023-09-05 DIAGNOSIS — H6691 Otitis media, unspecified, right ear: Secondary | ICD-10-CM | POA: Diagnosis not present

## 2023-09-05 LAB — CBC
HCT: 25.6 % — ABNORMAL LOW (ref 36.0–46.0)
Hemoglobin: 8.2 g/dL — ABNORMAL LOW (ref 12.0–15.0)
MCH: 27.9 pg (ref 26.0–34.0)
MCHC: 32 g/dL (ref 30.0–36.0)
MCV: 87.1 fL (ref 80.0–100.0)
Platelets: 215 10*3/uL (ref 150–400)
RBC: 2.94 MIL/uL — ABNORMAL LOW (ref 3.87–5.11)
RDW: 18.8 % — ABNORMAL HIGH (ref 11.5–15.5)
WBC: 4.8 10*3/uL (ref 4.0–10.5)
nRBC: 0 % (ref 0.0–0.2)

## 2023-09-05 LAB — RENAL FUNCTION PANEL
Albumin: 3.6 g/dL (ref 3.5–5.0)
Anion gap: 13 (ref 5–15)
BUN: 99 mg/dL — ABNORMAL HIGH (ref 6–20)
CO2: 27 mmol/L (ref 22–32)
Calcium: 8.5 mg/dL — ABNORMAL LOW (ref 8.9–10.3)
Chloride: 100 mmol/L (ref 98–111)
Creatinine, Ser: 7.68 mg/dL — ABNORMAL HIGH (ref 0.44–1.00)
GFR, Estimated: 6 mL/min — ABNORMAL LOW (ref >60)
Glucose, Bld: 84 mg/dL (ref 70–99)
Phosphorus: 5.1 mg/dL — ABNORMAL HIGH (ref 2.5–4.6)
Potassium: 5.1 mmol/L (ref 3.5–5.1)
Sodium: 140 mmol/L (ref 135–145)

## 2023-09-05 MED ORDER — ALTEPLASE 2 MG IJ SOLR: 2.0000 mg | Freq: Once | INTRAMUSCULAR | Status: DC | PRN

## 2023-09-05 MED ORDER — OXYCODONE HCL 5 MG PO TABS
ORAL_TABLET | ORAL | Status: AC
Start: 2023-09-05 — End: ?
  Filled 2023-09-05: qty 1

## 2023-09-05 MED ORDER — HEPARIN SODIUM (PORCINE) 1000 UNIT/ML DIALYSIS: 1000.0000 [IU] | INTRAMUSCULAR | Status: DC | PRN

## 2023-09-05 NOTE — Progress Notes (Signed)
 Progress Note    Allison Hill  BJY:[id number] DOB: 01-07-1971  DOA: 08/09/2023 PCP: Melvenia Beam, MD      Brief Narrative:    Medical records reviewed and are as summarized below:  Allison Hill is a 53 y.o. female with medical history significant of ESRD on HD MWF, refractory HTN, chronic HFpEF, chronic iron deficiency anemia admitted to hospitalist service for evaluation of chest pain, hypertensive urgency.  Patient's chest pain atypical, troponins negative.  Blood pressure improved with home regimen.  Patient lives with her daughter who does not want to take her home given high risk for falls, not safe for her HD transportation.  HD outpatient facility arranged.  TOC working on placement   On 08/13/2023/08/14/2023 patient is complaining of severe pain in her right ear that is not controlled by tylenol. The pain has not improved with IV Unasyn. CT head demonstrated mastoiditis and otitis media with swelling of the external canal. The patient was given IV Fentanyl for pain and antibiotics were expanded to include cipro PO. She will undergo dialysis on Saturday as part of her usual schedule.  PT/OT recommended nursing home placement, however insurance company denied the application.  4/2: Remained medically stable.  Difficult disposition as daughter does not want her coming back to her home.  TOC is working on it.  4/3: Had her HD today.  Awaiting placement  4/5: HD today.  Still awaiting placement   Assessment/Plan:   Principal Problem:   Nonspecific chest pain Active Problems:   Otitis media not resolved, right   Hypertensive urgency   ESRD on dialysis (HCC)   Right upper quadrant abdominal pain    Body mass index is 25.27 kg/m.   Right otitis media Resolved. Completed 7 days of antibiotics on 08/20/2023 (including IV Unasyn followed by ciprofloxacin and Levaquin).    Hypertensive urgency Orthostatic hypotension. Fluctuating BP.  BP  significantly  elevated.  Increase amlodipine again from 5 to 10 mg daily.   Continue amlodipine, labetalol, losartan, terazosin and hydralazine.    ESRD on dialysis (HCC) Hyperkalemia Hypocalcemia Anemia of chronic kidney disease Plan for hemodialysis today Continue Epogen, Cinacalcet, sevelamer and calcium carbonate Follow-up with nephrologist for hemodialysis  Chest pain Resolved   Right upper quadrant abdominal pain Improved.  Continue Protonix US of the RUQ of the abdomen is negative for cholecystitis or cholelithiasis.     General weakness No safe discharge plan.  Awaiting placement to long-term care facility Follow-up with transition of care team to assist with disposition  Analgesics as needed for shoulder osteoarthritis  Patient has been informed that insurance authorization for SNF was denied.  She said she has nowhere to go.  She is going to discuss this further with her daughter and come up with a plan.  Discussed with Deliliah, case Production designer, theatre/television/film.  She said patient may have to pay out-of-pocket to get to SNF.  Follow-up with TOC to assist with disposition.   Diet Order             Diet Heart Room service appropriate? Yes; Fluid consistency: Thin  Diet effective now                  Consultants: Nephrologist  Procedures: None   Medications:    amLODipine  10 mg Oral QPM   aspirin EC  81 mg Oral Daily   atorvastatin  40 mg Oral Daily   calcitRIOL  0.25 mcg Oral Q T,Th,Sa-HD   calcium carbonate  400 mg of elemental calcium Oral BID   Chlorhexidine Gluconate Cloth  6 each Topical Q0600   cinacalcet  30 mg Oral Q supper   epoetin alfa  10,000 Units Intravenous Q T,Th,Sa-HD   fluticasone  1 spray Each Nare Daily   gabapentin  200 mg Oral BID   heparin  5,000 Units Subcutaneous Q12H   hydrALAZINE  100 mg Oral TID   hydrOXYzine  25 mg Oral TID   irbesartan  300 mg Oral QPM   isosorbide mononitrate  120 mg Oral Daily   labetalol  600 mg Oral BID   pantoprazole  40 mg  Oral Daily   senna-docusate  2 tablet Oral BID   sevelamer carbonate  1,600 mg Oral TID with meals   terazosin  2 mg Oral BID   Continuous Infusions:   Anti-infectives (From admission, onward)    Start     Dose/Rate Route Frequency Ordered Stop   08/19/23 1800  levofloxacin (LEVAQUIN) tablet 250 mg        250 mg Oral Every evening 08/19/23 1504 08/20/23 1739   08/15/23 1800  ciprofloxacin (CIPRO) tablet 500 mg  Status:  Discontinued        500 mg Oral Every evening 08/14/23 0856 08/19/23 1502   08/14/23 0915  ciprofloxacin (CIPRO) tablet 500 mg        500 mg Oral  Once 08/14/23 0856 08/14/23 1001   08/14/23 0615  Ampicillin-Sulbactam (UNASYN) 3 g in sodium chloride 0.9 % 100 mL IVPB  Status:  Discontinued        3 g 200 mL/hr over 30 Minutes Intravenous Every 12 hours 08/14/23 0528 08/16/23 1427        Family Communication/Anticipated D/C date and plan/Code Status   DVT prophylaxis: heparin injection 5,000 Units Start: 08/09/23 1000     Code Status: Full Code  Family Communication: None Disposition Plan: Plan to discharge to SNF, likely to a LTC   Status is: Observation The patient will require care spanning > 2 midnights and should be moved to inpatient because: No safe discharge plan   Subjective:  Patient was seen and examined today.  No new concern  Objective:    Vitals:   09/05/23 0817 09/05/23 1235 09/05/23 1253 09/05/23 1300  BP: (!) 146/89 (!) 154/97 (!) 170/100 (!) 166/103  Pulse: 73 85 81 82  Resp: 18 19 14 17   Temp: 98 F (36.7 C) 98.7 F (37.1 C)    TempSrc: Oral Oral    SpO2: 100% 100% 100% 100%  Weight:  64.7 kg    Height:       No data found.  No intake or output data in the 24 hours ending 09/05/23 1334  Filed Weights   09/03/23 0749 09/03/23 1150 09/05/23 1235  Weight: 66.7 kg 64.7 kg 64.7 kg    Exam: General.  Well-developed lady, in no acute distress. Pulmonary.  Lungs clear bilaterally, normal respiratory effort. CV.  Regular  rate and rhythm, no JVD, rub or murmur. Abdomen.  Soft, nontender, nondistended, BS positive. CNS.  Alert and oriented .  No focal neurologic deficit. Extremities.  No edema, no cyanosis, pulses intact and symmetrical. Psychiatry.  Judgment and insight appears normal.   Data Reviewed:   I have personally reviewed following labs and imaging studies:  Labs: Labs show the following:   Basic Metabolic Panel: Recent Labs  Lab 09/01/23 0737 09/03/23 0809 09/05/23 1258  NA 136 136 140  K 5.5* 5.1 5.1  CL  98 98 100  CO2 25 25 27   GLUCOSE 82 90 84  BUN 92* 96* 99*  CREATININE 8.31* 7.99* 7.68*  CALCIUM 8.1* 8.7* 8.5*  PHOS 5.4* 5.1* 5.1*   GFR Estimated Creatinine Clearance: 7.8 mL/min (A) (by C-G formula based on SCr of 7.68 mg/dL (H)). Liver Function Tests: Recent Labs  Lab 09/01/23 0737 09/03/23 0809 09/05/23 1258  ALBUMIN 3.4* 3.4* 3.6   No results for input(s): "LIPASE", "AMYLASE" in the last 168 hours. No results for input(s): "AMMONIA" in the last 168 hours. Coagulation profile No results for input(s): "INR", "PROTIME" in the last 168 hours.  CBC: Recent Labs  Lab 09/01/23 0737 09/03/23 0809 09/05/23 1258  WBC 5.5 4.6 4.8  HGB 7.7* 8.1* 8.2*  HCT 24.1* 25.1* 25.6*  MCV 84.9 83.4 87.1  PLT 235 234 215   Cardiac Enzymes: No results for input(s): "CKTOTAL", "CKMB", "CKMBINDEX", "TROPONINI" in the last 168 hours. BNP (last 3 results) No results for input(s): "PROBNP" in the last 8760 hours. CBG: No results for input(s): "GLUCAP" in the last 168 hours. D-Dimer: No results for input(s): "DDIMER" in the last 72 hours. Hgb A1c: No results for input(s): "HGBA1C" in the last 72 hours. Lipid Profile: No results for input(s): "CHOL", "HDL", "LDLCALC", "TRIG", "CHOLHDL", "LDLDIRECT" in the last 72 hours. Thyroid function studies: No results for input(s): "TSH", "T4TOTAL", "T3FREE", "THYROIDAB" in the last 72 hours.  Invalid input(s): "FREET3" Anemia work  up: No results for input(s): "VITAMINB12", "FOLATE", "FERRITIN", "TIBC", "IRON", "RETICCTPCT" in the last 72 hours. Sepsis Labs: Recent Labs  Lab 09/01/23 0737 09/03/23 0809 09/05/23 1258  WBC 5.5 4.6 4.8    Microbiology No results found for this or any previous visit (from the past 240 hours).  Procedures and diagnostic studies:  No results found.    LOS: 0 days   Arnetha Courser MD  [facility]   Pager on [email or web address]. If 7PM-7AM, please contact night-coverage at [email or web address]  09/05/2023, 1:34 PM

## 2023-09-05 NOTE — Progress Notes (Signed)
 Hemodialysis note  Received patient in wheelchair to unit. Alert and oriented.  Informed consent signed and in chart.  Tx duration: 3.5 hours  Patient tolerated well. Transported back to room, alert without acute distress.  Report given to patient's RN.   Access used: Right Chest HD Catheter Access issues: none  Total UF removed: 2L Medication(s) given:  Retacrit 10 000 units IV, Oxycodone 5 mg tab PO  Post HD weight: 63.5 kg   Wolfgang Phoenix [facility]

## 2023-09-05 NOTE — Progress Notes (Signed)
 [facility]  Dialysis Note   Subjective:   Seen and examined on hemodialysis treatment.  Tolerating treatment well.   HEMODIALYSIS FLOWSHEET:  Blood Flow Rate (mL/min): 0 mL/min Arterial Pressure (mmHg): 47.07 mmHg Venous Pressure (mmHg): 18.18 mmHg TMP (mmHg): 0 mmHg Ultrafiltration Rate (mL/min): 0 mL/min Dialysate Flow Rate (mL/min): 299 ml/min Dialysis Fluid Bolus: Normal Saline    Objective:  Vital signs in last 24 hours:  Temp:  [98 F (36.7 C)-98.7 F (37.1 C)] 98.7 F (37.1 C) (04/05 1235) Pulse Rate:  [73-86] 85 (04/05 1235) Resp:  [17-19] 19 (04/05 1235) BP: (115-154)/(76-97) 154/97 (04/05 1235) SpO2:  [95 %-100 %] 100 % (04/05 1235) Weight:  [64.7 kg] 64.7 kg (04/05 1235)  Weight change:  Filed Weights   09/03/23 0749 09/03/23 1150 09/05/23 1235  Weight: 66.7 kg 64.7 kg 64.7 kg    Intake/Output: No intake/output data recorded.   Intake/Output this shift:  No intake/output data recorded.  Physical Exam: General: NAD,   Head: Normocephalic, atraumatic. Moist oral mucosal membranes  Eyes: Anicteric, PERRL  Neck: Supple, trachea midline  Lungs:  Clear to auscultation  Heart: Regular rate and rhythm  Abdomen:  Soft, nontender,   Extremities:  peripheral edema.  Neurologic: Nonfocal, moving all four extremities  Skin: No lesions  Access:     Basic Metabolic Panel: Recent Labs  Lab 09/01/23 0737 09/03/23 0809  NA 136 136  K 5.5* 5.1  CL 98 98  CO2 25 25  GLUCOSE 82 90  BUN 92* 96*  CREATININE 8.31* 7.99*  CALCIUM 8.1* 8.7*  PHOS 5.4* 5.1*    Liver Function Tests: Recent Labs  Lab 09/01/23 0737 09/03/23 0809  ALBUMIN 3.4* 3.4*   No results for input(s): "LIPASE", "AMYLASE" in the last 168 hours. No results for input(s): "AMMONIA" in the last 168 hours.  CBC: Recent Labs  Lab 09/01/23 0737 09/03/23 0809  WBC 5.5 4.6  HGB 7.7* 8.1*  HCT 24.1* 25.1*  MCV 84.9 83.4  PLT 235 234    Cardiac Enzymes: No results  for input(s): "CKTOTAL", "CKMB", "CKMBINDEX", "TROPONINI" in the last 168 hours.  BNP: Invalid input(s): "POCBNP"  CBG: No results for input(s): "GLUCAP" in the last 168 hours.  Microbiology: Results for orders placed or performed during the hospital encounter of 08/09/23  Resp panel by RT-PCR (RSV, Flu A&B, Covid) Anterior Nasal Swab     Status: None   Collection Time: 08/14/23  3:44 AM   Specimen: Anterior Nasal Swab  Result Value Ref Range Status   SARS Coronavirus 2 by RT PCR NEGATIVE NEGATIVE Final    Comment: (NOTE) SARS-CoV-2 target nucleic acids are NOT DETECTED.  The SARS-CoV-2 RNA is generally detectable in upper respiratory specimens during the acute phase of infection. The lowest concentration of SARS-CoV-2 viral copies this assay can detect is 138 copies/mL. A negative result does not preclude SARS-Cov-2 infection and should not be used as the sole basis for treatment or other patient management decisions. A negative result may occur with  improper specimen collection/handling, submission of specimen other than nasopharyngeal swab, presence of viral mutation(s) within the areas targeted by this assay, and inadequate number of viral copies(<138 copies/mL). A negative result must be combined with clinical observations, patient history, and epidemiological information. The expected result is Negative.  Fact Sheet for Patients:  BloggerCourse.com  Fact Sheet for Healthcare Providers:  SeriousBroker.it  This test is no t yet approved or cleared by the [place] and  has been authorized  for detection and/or diagnosis of SARS-CoV-2 by FDA under an Emergency Use Authorization (EUA). This EUA will remain  in effect (meaning this test can be used) for the duration of the COVID-19 declaration under Section 564(b)(1) of the Act, 21 U.S.C.section 360bbb-3(b)(1), unless the authorization is terminated  or revoked  sooner.       Influenza A by PCR NEGATIVE NEGATIVE Final   Influenza B by PCR NEGATIVE NEGATIVE Final    Comment: (NOTE) The Xpert Xpress SARS-CoV-2/FLU/RSV plus assay is intended as an aid in the diagnosis of influenza from Nasopharyngeal swab specimens and should not be used as a sole basis for treatment. Nasal washings and aspirates are unacceptable for Xpert Xpress SARS-CoV-2/FLU/RSV testing.  Fact Sheet for Patients: BloggerCourse.com  Fact Sheet for Healthcare Providers: SeriousBroker.it  This test is not yet approved or cleared by the [place] FDA and has been authorized for detection and/or diagnosis of SARS-CoV-2 by FDA under an Emergency Use Authorization (EUA). This EUA will remain in effect (meaning this test can be used) for the duration of the COVID-19 declaration under Section 564(b)(1) of the Act, 21 U.S.C. section 360bbb-3(b)(1), unless the authorization is terminated or revoked.     Resp Syncytial Virus by PCR NEGATIVE NEGATIVE Final    Comment: (NOTE) Fact Sheet for Patients: BloggerCourse.com  Fact Sheet for Healthcare Providers: SeriousBroker.it  This test is not yet approved or cleared by the [place] FDA and has been authorized for detection and/or diagnosis of SARS-CoV-2 by FDA under an Emergency Use Authorization (EUA). This EUA will remain in effect (meaning this test can be used) for the duration of the COVID-19 declaration under Section 564(b)(1) of the Act, 21 U.S.C. section 360bbb-3(b)(1), unless the authorization is terminated or revoked.  Performed at [facility], [address] [place], [place] [address]   Respiratory (~20 pathogens) panel by PCR     Status: None   Collection Time: 08/14/23  3:44 AM   Specimen: Nasopharyngeal Swab; Respiratory  Result Value Ref Range Status   Adenovirus NOT DETECTED NOT DETECTED  Final   Coronavirus 229E NOT DETECTED NOT DETECTED Final    Comment: (NOTE) The Coronavirus on the Respiratory Panel, DOES NOT test for the novel  Coronavirus (2019 nCoV)    Coronavirus HKU1 NOT DETECTED NOT DETECTED Final   Coronavirus NL63 NOT DETECTED NOT DETECTED Final   Coronavirus OC43 NOT DETECTED NOT DETECTED Final   Metapneumovirus NOT DETECTED NOT DETECTED Final   Rhinovirus / Enterovirus NOT DETECTED NOT DETECTED Final   Influenza A NOT DETECTED NOT DETECTED Final   Influenza B NOT DETECTED NOT DETECTED Final   Parainfluenza Virus 1 NOT DETECTED NOT DETECTED Final   Parainfluenza Virus 2 NOT DETECTED NOT DETECTED Final   Parainfluenza Virus 3 NOT DETECTED NOT DETECTED Final   Parainfluenza Virus 4 NOT DETECTED NOT DETECTED Final   Respiratory Syncytial Virus NOT DETECTED NOT DETECTED Final   Bordetella pertussis NOT DETECTED NOT DETECTED Final   Bordetella Parapertussis NOT DETECTED NOT DETECTED Final   Chlamydophila pneumoniae NOT DETECTED NOT DETECTED Final   Mycoplasma pneumoniae NOT DETECTED NOT DETECTED Final    Comment: Performed at [facility], [address], [place], [place] [address]  Culture, blood (Routine X 2) w Reflex to ID Panel     Status: None   Collection Time: 08/14/23  9:19 AM   Specimen: BLOOD  Result Value Ref Range Status   Specimen Description BLOOD BLOOD RIGHT HAND  Final  Special Requests   Final    BOTTLES DRAWN AEROBIC AND ANAEROBIC Blood Culture adequate volume   Culture   Final    NO GROWTH 5 DAYS Performed at [facility], [address] [place], [place] [address]    Report Status 08/19/2023 FINAL  Final  Culture, blood (Routine X 2) w Reflex to ID Panel     Status: None   Collection Time: 08/14/23  9:19 AM   Specimen: BLOOD  Result Value Ref Range Status   Specimen Description BLOOD BLOOD LEFT HAND  Final   Special Requests   Final    BOTTLES DRAWN AEROBIC AND ANAEROBIC Blood Culture adequate volume   Culture    Final    NO GROWTH 5 DAYS Performed at [facility], [address] [place], [place] [address]    Report Status 08/19/2023 FINAL  Final  Ear culture     Status: None   Collection Time: 08/14/23 10:11 AM   Specimen: Ear; Other  Result Value Ref Range Status   Specimen Description   Final    EAR Performed at [facility], [address], [place], [place] [address]    Special Requests   Final    RIGHT EAR Performed at [facility], [address] [place], [place] [address]    Culture RARE STREPTOCOCCUS PNEUMONIAE  Final   Report Status 08/16/2023 FINAL  Final   Organism ID, Bacteria STREPTOCOCCUS PNEUMONIAE  Final      Susceptibility   Streptococcus pneumoniae - MIC*    ERYTHROMYCIN <=0.12 SENSITIVE Sensitive     LEVOFLOXACIN 1 SENSITIVE Sensitive     VANCOMYCIN 0.5 SENSITIVE Sensitive     PENICILLIN (meningitis) <=0.06 SENSITIVE Sensitive     PENO - penicillin <=0.06      PENICILLIN (non-meningitis) <=0.06 SENSITIVE Sensitive     PENICILLIN (oral) <=0.06 SENSITIVE Sensitive     CEFTRIAXONE (non-meningitis) <=0.12 SENSITIVE Sensitive     CEFTRIAXONE (meningitis) <=0.12 SENSITIVE Sensitive     * RARE STREPTOCOCCUS PNEUMONIAE    Coagulation Studies: No results for input(s): "LABPROT", "INR" in the last 72 hours.  Urinalysis: No results for input(s): "COLORURINE", "LABSPEC", "PHURINE", "GLUCOSEU", "HGBUR", "BILIRUBINUR", "KETONESUR", "PROTEINUR", "UROBILINOGEN", "NITRITE", "LEUKOCYTESUR" in the last 72 hours.  Invalid input(s): "APPERANCEUR"    Imaging: No results found.   Medications:     amLODipine  10 mg Oral QPM   aspirin EC  81 mg Oral Daily   atorvastatin  40 mg Oral Daily   calcitRIOL  0.25 mcg Oral Q T,Th,Sa-HD   calcium carbonate  400 mg of elemental calcium Oral BID   Chlorhexidine Gluconate Cloth  6 each Topical Q0600   cinacalcet  30 mg Oral Q supper   epoetin alfa  10,000 Units Intravenous Q T,Th,Sa-HD   fluticasone  1  spray Each Nare Daily   gabapentin  200 mg Oral BID   heparin  5,000 Units Subcutaneous Q12H   hydrALAZINE  100 mg Oral TID   hydrOXYzine  25 mg Oral TID   irbesartan  300 mg Oral QPM   isosorbide mononitrate  120 mg Oral Daily   labetalol  600 mg Oral BID   pantoprazole  40 mg Oral Daily   senna-docusate  2 tablet Oral BID   sevelamer carbonate  1,600 mg Oral TID with meals   terazosin  2 mg Oral BID   alteplase, guaiFENesin-dextromethorphan, heparin, ondansetron (ZOFRAN) IV, oxyCODONE  Assessment/ Plan:  Allison Hill is a 53 y.o.  female hypertension, congestive heart failure, ESRD on hemodialysis and anemia initially admitted for hypertensive urgency.  Principal Problem:   Nonspecific chest pain Active Problems:   Otitis media not resolved, right   Hypertensive urgency   ESRD on dialysis (HCC)   Right upper quadrant abdominal pain     End Stage Renal Disease on hemodialysis: Tolerating dialysis well.  No results found for: "POTASSIUM" No intake or output data in the 24 hours ending 09/05/23 1252  2. Hypertension with chronic kidney disease: Blood pressure is stable.   BP (!) 154/97 (BP Location: Right Arm) Comment: Simultaneous filing. User may not have seen previous data.  Pulse 85 Comment: Simultaneous filing. User may not have seen previous data.  Temp 98.7 F (37.1 C) (Oral)   Resp 19   Ht 5\' 3"  (1.6 m)   Wt 64.7 kg   SpO2 100%   BMI 25.27 kg/m   3. Anemia of chronic kidney disease/ kidney injury/chronic disease/acute blood loss: Will continue anemia protocol. Lab Results  Component Value Date   HGB 8.1 (L) 09/03/2023    4. Secondary Hyperparathyroidism: Continue sevelamer and calcitriol.   Lab Results  Component Value Date   PTH 1,248 (H) 08/11/2023   CALCIUM 8.7 (L) 09/03/2023   PHOS 5.1 (H) 09/03/2023    Labs and medications reviewed. Will continue to monitor closely.   LOS: 0 Lorain Childes, MD [facility] 4/5/202512:52 PM

## 2023-09-05 NOTE — Progress Notes (Signed)
 MD notified.   09/05/23 1757  What Happened  Was fall witnessed? No  Was patient injured? No  Patient found on floor  Found by Staff-comment  Stated prior activity bathroom-unassisted (Trying to go to bathroom without calling. Call bell within reach)  Provider Notification  Provider Name/Title Arnetha Courser  Date Provider Notified 09/05/23  Time Provider Notified 1758  Method of Notification Page  Notification Reason Fall  Provider response Other (Comment)  Date of Provider Response 09/05/23  Time of Provider Response 1803  Follow Up  Family notified No - patient refusal  Additional tests No  Simple treatment Other (comment)  Progress note created (see row info) Yes  Adult Fall Risk Assessment  Risk Factor Category (scoring not indicated) Fall has occurred during this admission (document High fall risk)  Age 53  Fall History: Fall within 6 months prior to admission 5  Elimination; Bowel and/or Urine Incontinence 0  Elimination; Bowel and/or Urine Urgency/Frequency 2  Medications: includes PCA/Opiates, Anti-convulsants, Anti-hypertensives, Diuretics, Hypnotics, Laxatives, Sedatives, and Psychotropics 5  Patient Care Equipment 1  Mobility-Assistance 2  Mobility-Gait 2  Mobility-Sensory Deficit 0  Altered awareness of immediate physical environment 0  Impulsiveness 0  Lack of understanding of one's physical/cognitive limitations 0  Total Score 17  Patient Fall Risk Level High fall risk  Adult Fall Risk Interventions  Required Bundle Interventions *See Row Information* High fall risk - low, moderate, and high requirements implemented  Additional Interventions Use of appropriate toileting equipment (bedpan, BSC, etc.)  Fall intervention(s) refused/Patient educated regarding refusal Bed alarm;Nonskid socks  Screening for Fall Injury Risk (To be completed on HIGH fall risk patients) - Assessing Need for Floor Mats  Risk For Fall Injury- Criteria for Floor Mats None identified - No  additional interventions needed  Vitals  Temp 98.2 F (36.8 C)  Temp Source Oral  BP (!) 160/102  MAP (mmHg) 119  BP Location Left Arm  BP Method Automatic  Patient Position (if appropriate) Sitting  Pulse Rate 89  Pulse Rate Source Dinamap  Resp 18  Oxygen Therapy  SpO2 100 %  O2 Device Room Air  Pain Assessment  Pain Scale 0-10  Pain Score 2  PCA/Epidural/Spinal Assessment  Respiratory Pattern Regular;Unlabored  Neurological  Neuro (WDL) WDL  Level of Consciousness Alert  Orientation Level Oriented X4

## 2023-09-06 DIAGNOSIS — N186 End stage renal disease: Secondary | ICD-10-CM | POA: Diagnosis not present

## 2023-09-06 DIAGNOSIS — I132 Hypertensive heart and chronic kidney disease with heart failure and with stage 5 chronic kidney disease, or end stage renal disease: Secondary | ICD-10-CM | POA: Diagnosis not present

## 2023-09-06 DIAGNOSIS — R079 Chest pain, unspecified: Secondary | ICD-10-CM | POA: Diagnosis not present

## 2023-09-06 DIAGNOSIS — I160 Hypertensive urgency: Secondary | ICD-10-CM | POA: Diagnosis not present

## 2023-09-06 DIAGNOSIS — H6691 Otitis media, unspecified, right ear: Secondary | ICD-10-CM | POA: Diagnosis not present

## 2023-09-06 NOTE — Progress Notes (Signed)
 Becca RN notified of BP

## 2023-09-06 NOTE — Progress Notes (Signed)
 Progress Note    Allison Hill  WUJ:[id number] DOB: [birth year]  DOA: 08/09/2023 PCP: Melvenia Beam, MD      Brief Narrative:    Medical records reviewed and are as summarized below:  Allison Hill is a 53 y.o. female with medical history significant of ESRD on HD MWF, refractory HTN, chronic HFpEF, chronic iron deficiency anemia admitted to hospitalist service for evaluation of chest pain, hypertensive urgency.  Patient's chest pain atypical, troponins negative.  Blood pressure improved with home regimen.  Patient lives with her daughter who does not want to take her home given high risk for falls, not safe for her HD transportation.  HD outpatient facility arranged.  TOC working on placement   On 08/13/2023/08/14/2023 patient is complaining of severe pain in her right ear that is not controlled by tylenol. The pain has not improved with IV Unasyn. CT head demonstrated mastoiditis and otitis media with swelling of the external canal. The patient was given IV Fentanyl for pain and antibiotics were expanded to include cipro PO. She will undergo dialysis on Saturday as part of her usual schedule.  PT/OT recommended nursing home placement, however insurance company denied the application.  4/2: Remained medically stable.  Difficult disposition as daughter does not want her coming back to her home.  TOC is working on it.  4/3: Had her HD today.  Awaiting placement  4/5: HD today.  Still awaiting placement  4/6: Remains stable.  Had a fall yesterday evening with no injuries   Assessment/Plan:   Principal Problem:   Nonspecific chest pain Active Problems:   Otitis media not resolved, right   Hypertensive urgency   ESRD on dialysis (HCC)   Right upper quadrant abdominal pain    Body mass index is 24.8 kg/m.   Right otitis media Resolved. Completed 7 days of antibiotics on 08/20/2023 (including IV Unasyn followed by ciprofloxacin and Levaquin).    Hypertensive  urgency Orthostatic hypotension. Fluctuating BP.  BP  significantly elevated.  Increase amlodipine again from 5 to 10 mg daily.   Continue amlodipine, labetalol, losartan, terazosin and hydralazine.    ESRD on dialysis (HCC) Hyperkalemia Hypocalcemia Anemia of chronic kidney disease Plan for hemodialysis today Continue Epogen, Cinacalcet, sevelamer and calcium carbonate Follow-up with nephrologist for hemodialysis  Chest pain Resolved   Right upper quadrant abdominal pain Improved.  Continue Protonix US of the RUQ of the abdomen is negative for cholecystitis or cholelithiasis.     General weakness No safe discharge plan.  Awaiting placement to long-term care facility Follow-up with transition of care team to assist with disposition  Analgesics as needed for shoulder osteoarthritis  Patient has been informed that insurance authorization for SNF was denied.  She said she has nowhere to go.  She is going to discuss this further with her daughter and come up with a plan.  Discussed with Deliliah, case Production designer, theatre/television/film.  She said patient may have to pay out-of-pocket to get to SNF.  Follow-up with TOC to assist with disposition.   Diet Order             Diet Heart Room service appropriate? Yes; Fluid consistency: Thin  Diet effective now                  Consultants: Nephrologist  Procedures: None   Medications:    amLODipine  10 mg Oral QPM   aspirin EC  81 mg Oral Daily   atorvastatin  40 mg Oral Daily  calcitRIOL  0.25 mcg Oral Q T,Th,Sa-HD   calcium carbonate  400 mg of elemental calcium Oral BID   Chlorhexidine Gluconate Cloth  6 each Topical Q0600   cinacalcet  30 mg Oral Q supper   epoetin alfa  10,000 Units Intravenous Q T,Th,Sa-HD   fluticasone  1 spray Each Nare Daily   gabapentin  200 mg Oral BID   heparin  5,000 Units Subcutaneous Q12H   hydrALAZINE  100 mg Oral TID   hydrOXYzine  25 mg Oral TID   irbesartan  300 mg Oral QPM   isosorbide mononitrate  120  mg Oral Daily   labetalol  600 mg Oral BID   pantoprazole  40 mg Oral Daily   senna-docusate  2 tablet Oral BID   sevelamer carbonate  1,600 mg Oral TID with meals   terazosin  2 mg Oral BID   Continuous Infusions:   Anti-infectives (From admission, onward)    Start     Dose/Rate Route Frequency Ordered Stop   08/19/23 1800  levofloxacin (LEVAQUIN) tablet 250 mg        250 mg Oral Every evening 08/19/23 1504 08/20/23 1739   08/15/23 1800  ciprofloxacin (CIPRO) tablet 500 mg  Status:  Discontinued        500 mg Oral Every evening 08/14/23 0856 08/19/23 1502   08/14/23 0915  ciprofloxacin (CIPRO) tablet 500 mg        500 mg Oral  Once 08/14/23 0856 08/14/23 1001   08/14/23 0615  Ampicillin-Sulbactam (UNASYN) 3 g in sodium chloride 0.9 % 100 mL IVPB  Status:  Discontinued        3 g 200 mL/hr over 30 Minutes Intravenous Every 12 hours 08/14/23 0528 08/16/23 1427        Family Communication/Anticipated D/C date and plan/Code Status   DVT prophylaxis: heparin injection 5,000 Units Start: 08/09/23 1000     Code Status: Full Code  Family Communication: None Disposition Plan: Plan to discharge to SNF, likely to a LTC   Status is: Observation The patient will require care spanning > 2 midnights and should be moved to inpatient because: No safe discharge plan   Subjective:  Patient was lying comfortably in bed when seen today.  No new concern.  Objective:    Vitals:   09/05/23 2119 09/05/23 2242 09/06/23 0812 09/06/23 0819  BP: (!) 176/102 (!) 160/93 (!) 180/101 (!) 158/80  Pulse: 87 93 79   Resp:  15 17   Temp:   98.5 F (36.9 C)   TempSrc:      SpO2:  100% 100%   Weight:      Height:       No data found.   Intake/Output Summary (Last 24 hours) at 09/06/2023 1407 Last data filed at 09/06/2023 1049 Gross per 24 hour  Intake 0 ml  Output 2000 ml  Net -2000 ml    Filed Weights   09/03/23 1150 09/05/23 1235 09/05/23 1633  Weight: 64.7 kg 64.7 kg 63.5 kg     Exam: General.  Well-developed lady, in no acute distress. Pulmonary.  Lungs clear bilaterally, normal respiratory effort. CV.  Regular rate and rhythm, no JVD, rub or murmur. Abdomen.  Soft, nontender, nondistended, BS positive. CNS.  Alert and oriented .  No focal neurologic deficit. Extremities.  No edema, no cyanosis, pulses intact and symmetrical. Psychiatry.  Judgment and insight appears normal.    Data Reviewed:   I have personally reviewed following labs and imaging studies:  Labs: Labs show the following:   Basic Metabolic Panel: Recent Labs  Lab 09/01/23 0737 09/03/23 0809 09/05/23 1258  NA 136 136 140  K 5.5* 5.1 5.1  CL 98 98 100  CO2 25 25 27   GLUCOSE 82 90 84  BUN 92* 96* 99*  CREATININE 8.31* 7.99* 7.68*  CALCIUM 8.1* 8.7* 8.5*  PHOS 5.4* 5.1* 5.1*   GFR Estimated Creatinine Clearance: 7.7 mL/min (A) (by C-G formula based on SCr of 7.68 mg/dL (H)). Liver Function Tests: Recent Labs  Lab 09/01/23 0737 09/03/23 0809 09/05/23 1258  ALBUMIN 3.4* 3.4* 3.6   No results for input(s): "LIPASE", "AMYLASE" in the last 168 hours. No results for input(s): "AMMONIA" in the last 168 hours. Coagulation profile No results for input(s): "INR", "PROTIME" in the last 168 hours.  CBC: Recent Labs  Lab 09/01/23 0737 09/03/23 0809 09/05/23 1258  WBC 5.5 4.6 4.8  HGB 7.7* 8.1* 8.2*  HCT 24.1* 25.1* 25.6*  MCV 84.9 83.4 87.1  PLT 235 234 215   Cardiac Enzymes: No results for input(s): "CKTOTAL", "CKMB", "CKMBINDEX", "TROPONINI" in the last 168 hours. BNP (last 3 results) No results for input(s): "PROBNP" in the last 8760 hours. CBG: No results for input(s): "GLUCAP" in the last 168 hours. D-Dimer: No results for input(s): "DDIMER" in the last 72 hours. Hgb A1c: No results for input(s): "HGBA1C" in the last 72 hours. Lipid Profile: No results for input(s): "CHOL", "HDL", "LDLCALC", "TRIG", "CHOLHDL", "LDLDIRECT" in the last 72 hours. Thyroid function  studies: No results for input(s): "TSH", "T4TOTAL", "T3FREE", "THYROIDAB" in the last 72 hours.  Invalid input(s): "FREET3" Anemia work up: No results for input(s): "VITAMINB12", "FOLATE", "FERRITIN", "TIBC", "IRON", "RETICCTPCT" in the last 72 hours. Sepsis Labs: Recent Labs  Lab 09/01/23 0737 09/03/23 0809 09/05/23 1258  WBC 5.5 4.6 4.8    Microbiology No results found for this or any previous visit (from the past 240 hours).  Procedures and diagnostic studies:  No results found.    LOS: 0 days   Arnetha Courser MD  [facility]   Pager on [email or web address]. If 7PM-7AM, please contact night-coverage at [email or web address]  09/06/2023, 2:07 PM

## 2023-09-06 NOTE — Progress Notes (Signed)
 [facility]  PROGRESS NOTE   Subjective:   Patient seen at bedside.  Vitals are stable.  Had stable dialysis yesterday.  Objective:  Vital signs: Blood pressure (!) 158/80, pulse 79, temperature 98.5 F (36.9 C), resp. rate 17, height 5\' 3"  (1.6 m), weight 63.5 kg, SpO2 100%.  Intake/Output Summary (Last 24 hours) at 09/06/2023 1159 Last data filed at 09/06/2023 1049 Gross per 24 hour  Intake 0 ml  Output 2000 ml  Net -2000 ml   Filed Weights   09/03/23 1150 09/05/23 1235 09/05/23 1633  Weight: 64.7 kg 64.7 kg 63.5 kg     Physical Exam: General:  No acute distress  Head:  Normocephalic, atraumatic. Moist oral mucosal membranes  Eyes:  Anicteric  Neck:  Supple  Lungs:   Clear to auscultation, normal effort  Heart:  S1S2 no rubs  Abdomen:   Soft, nontender, bowel sounds present  Extremities:  peripheral edema.  Neurologic:  Awake, alert, following commands  Skin:  No lesions  Access:     Basic Metabolic Panel: Recent Labs  Lab 09/01/23 0737 09/03/23 0809 09/05/23 1258  NA 136 136 140  K 5.5* 5.1 5.1  CL 98 98 100  CO2 25 25 27   GLUCOSE 82 90 84  BUN 92* 96* 99*  CREATININE 8.31* 7.99* 7.68*  CALCIUM 8.1* 8.7* 8.5*  PHOS 5.4* 5.1* 5.1*   GFR: Estimated Creatinine Clearance: 7.7 mL/min (A) (by C-G formula based on SCr of 7.68 mg/dL (H)).  Liver Function Tests: Recent Labs  Lab 09/01/23 0737 09/03/23 0809 09/05/23 1258  ALBUMIN 3.4* 3.4* 3.6   No results for input(s): "LIPASE", "AMYLASE" in the last 168 hours. No results for input(s): "AMMONIA" in the last 168 hours.  CBC: Recent Labs  Lab 09/01/23 0737 09/03/23 0809 09/05/23 1258  WBC 5.5 4.6 4.8  HGB 7.7* 8.1* 8.2*  HCT 24.1* 25.1* 25.6*  MCV 84.9 83.4 87.1  PLT 235 234 215     HbA1C: No results found for: "HGBA1C"  Urinalysis: No results for input(s): "COLORURINE", "LABSPEC", "PHURINE", "GLUCOSEU", "HGBUR", "BILIRUBINUR", "KETONESUR", "PROTEINUR", "UROBILINOGEN",  "NITRITE", "LEUKOCYTESUR" in the last 72 hours.  Invalid input(s): "APPERANCEUR"    Imaging: No results found.   Medications:     amLODipine  10 mg Oral QPM   aspirin EC  81 mg Oral Daily   atorvastatin  40 mg Oral Daily   calcitRIOL  0.25 mcg Oral Q T,Th,Sa-HD   calcium carbonate  400 mg of elemental calcium Oral BID   Chlorhexidine Gluconate Cloth  6 each Topical Q0600   cinacalcet  30 mg Oral Q supper   epoetin alfa  10,000 Units Intravenous Q T,Th,Sa-HD   fluticasone  1 spray Each Nare Daily   gabapentin  200 mg Oral BID   heparin  5,000 Units Subcutaneous Q12H   hydrALAZINE  100 mg Oral TID   hydrOXYzine  25 mg Oral TID   irbesartan  300 mg Oral QPM   isosorbide mononitrate  120 mg Oral Daily   labetalol  600 mg Oral BID   pantoprazole  40 mg Oral Daily   senna-docusate  2 tablet Oral BID   sevelamer carbonate  1,600 mg Oral TID with meals   terazosin  2 mg Oral BID    Assessment/ Plan:     53 y.o.  female hypertension, congestive heart failure, ESRD on hemodialysis and anemia initially admitted for hypertensive urgency.   Principal Problem:   Nonspecific chest pain Active Problems:  Otitis media not resolved, right   Hypertensive urgency   ESRD on dialysis (HCC)   Right upper quadrant abdominal pain  #1: End-stage renal disease: Patient had stable dialysis yesterday.  She tolerated fluid removal.  #2: Hypertension: Blood pressure is better controlled at this time.  Will continue the hydralazine, terazosin, labetalol, irbesartan and Imdur.  Patient is advised on the importance of 2 g salt restricted diet.  #3: Secondary hyperparathyroidism: Will continue the calcium carbonate, calcitriol and sevelamer.  #4: Anemia: Will continue to follow anemia protocols.  Continue iron and Epogen at dialysis.  #5: Abdominal pain: Now improved.  Continue pantoprazole as ordered.  Labs and medications reviewed. Will continue to follow along with you.   LOS: 0 Lorain Childes, MD [facility] 4/6/202511:59 AM

## 2023-09-07 DIAGNOSIS — I160 Hypertensive urgency: Secondary | ICD-10-CM | POA: Diagnosis not present

## 2023-09-07 DIAGNOSIS — R079 Chest pain, unspecified: Secondary | ICD-10-CM | POA: Diagnosis not present

## 2023-09-07 DIAGNOSIS — H6691 Otitis media, unspecified, right ear: Secondary | ICD-10-CM | POA: Diagnosis not present

## 2023-09-07 DIAGNOSIS — N186 End stage renal disease: Secondary | ICD-10-CM | POA: Diagnosis not present

## 2023-09-07 DIAGNOSIS — I132 Hypertensive heart and chronic kidney disease with heart failure and with stage 5 chronic kidney disease, or end stage renal disease: Secondary | ICD-10-CM | POA: Diagnosis not present

## 2023-09-07 NOTE — Progress Notes (Signed)
 OT Cancellation Note  Patient Details Name: Allison Hill MRN: [id number] DOB: 1970-12-21   Cancelled Treatment:    Reason Eval/Treat Not Completed: Other (comment). Chart reviewed. BP significantly elevated, will defer exertional activity with therapy today, will re-attempt next date.  Arman Filter., MPH, MS, OTR/L ascom [phone] 09/07/23, 4:18 PM

## 2023-09-07 NOTE — Progress Notes (Signed)
 Progress Note    Allison Hill  ZOX:[id number] DOB: 1970-08-07  DOA: 08/09/2023 PCP: Melvenia Beam, MD      Brief Narrative:    Medical records reviewed and are as summarized below:  Allison Hill is a 53 y.o. female with medical history significant of ESRD on HD MWF, refractory HTN, chronic HFpEF, chronic iron deficiency anemia admitted to hospitalist service for evaluation of chest pain, hypertensive urgency.  Patient's chest pain atypical, troponins negative.  Blood pressure improved with home regimen.  Patient lives with her daughter who does not want to take her home given high risk for falls, not safe for her HD transportation.  HD outpatient facility arranged.  TOC working on placement   On 08/13/2023/08/14/2023 patient is complaining of severe pain in her right ear that is not controlled by tylenol. The pain has not improved with IV Unasyn. CT head demonstrated mastoiditis and otitis media with swelling of the external canal. The patient was given IV Fentanyl for pain and antibiotics were expanded to include cipro PO. She will undergo dialysis on Saturday as part of her usual schedule.  PT/OT recommended nursing home placement, however insurance company denied the application.  4/2: Remained medically stable.  Difficult disposition as daughter does not want her coming back to her home.  TOC is working on it.  4/3: Had her HD today.  Awaiting placement  4/5: HD today.  Still awaiting placement  4/6: Remains stable.  Had a fall yesterday evening with no injuries   Assessment/Plan:   Principal Problem:   Nonspecific chest pain Active Problems:   Otitis media not resolved, right   Hypertensive urgency   ESRD on dialysis (HCC)   Right upper quadrant abdominal pain    Body mass index is 24.8 kg/m.   Right otitis media Resolved. Completed 7 days of antibiotics on 08/20/2023 (including IV Unasyn followed by ciprofloxacin and Levaquin).    Hypertensive  urgency Orthostatic hypotension. Fluctuating BP.  BP  significantly elevated.  Increase amlodipine again from 5 to 10 mg daily.   Continue amlodipine, labetalol, losartan, terazosin and hydralazine.    ESRD on dialysis (HCC) Hyperkalemia Hypocalcemia Anemia of chronic kidney disease Plan for hemodialysis today Continue Epogen, Cinacalcet, sevelamer and calcium carbonate Follow-up with nephrologist for hemodialysis  Chest pain Resolved   Right upper quadrant abdominal pain Improved.  Continue Protonix US of the RUQ of the abdomen is negative for cholecystitis or cholelithiasis.     General weakness No safe discharge plan.  Awaiting placement to long-term care facility Follow-up with transition of care team to assist with disposition  Analgesics as needed for shoulder osteoarthritis  Patient has been informed that insurance authorization for SNF was denied.  She said she has nowhere to go.  She is going to discuss this further with her daughter and come up with a plan.  Discussed with Deliliah, case Production designer, theatre/television/film.  She said patient may have to pay out-of-pocket to get to SNF.  Follow-up with TOC to assist with disposition.   Diet Order             Diet Heart Room service appropriate? Yes; Fluid consistency: Thin  Diet effective now                  Consultants: Nephrologist  Procedures: None   Medications:    amLODipine  10 mg Oral QPM   aspirin EC  81 mg Oral Daily   atorvastatin  40 mg Oral Daily  calcitRIOL  0.25 mcg Oral Q T,Th,Sa-HD   calcium carbonate  400 mg of elemental calcium Oral BID   Chlorhexidine Gluconate Cloth  6 each Topical Q0600   cinacalcet  30 mg Oral Q supper   epoetin alfa  10,000 Units Intravenous Q T,Th,Sa-HD   fluticasone  1 spray Each Nare Daily   gabapentin  200 mg Oral BID   heparin  5,000 Units Subcutaneous Q12H   hydrALAZINE  100 mg Oral TID   hydrOXYzine  25 mg Oral TID   irbesartan  300 mg Oral QPM   isosorbide mononitrate  120  mg Oral Daily   labetalol  600 mg Oral BID   pantoprazole  40 mg Oral Daily   senna-docusate  2 tablet Oral BID   sevelamer carbonate  1,600 mg Oral TID with meals   terazosin  2 mg Oral BID   Continuous Infusions:   Anti-infectives (From admission, onward)    Start     Dose/Rate Route Frequency Ordered Stop   08/19/23 1800  levofloxacin (LEVAQUIN) tablet 250 mg        250 mg Oral Every evening 08/19/23 1504 08/20/23 1739   08/15/23 1800  ciprofloxacin (CIPRO) tablet 500 mg  Status:  Discontinued        500 mg Oral Every evening 08/14/23 0856 08/19/23 1502   08/14/23 0915  ciprofloxacin (CIPRO) tablet 500 mg        500 mg Oral  Once 08/14/23 0856 08/14/23 1001   08/14/23 0615  Ampicillin-Sulbactam (UNASYN) 3 g in sodium chloride 0.9 % 100 mL IVPB  Status:  Discontinued        3 g 200 mL/hr over 30 Minutes Intravenous Every 12 hours 08/14/23 0528 08/16/23 1427        Family Communication/Anticipated D/C date and plan/Code Status   DVT prophylaxis: heparin injection 5,000 Units Start: 08/09/23 1000     Code Status: Full Code  Family Communication: None Disposition Plan: Plan to discharge to SNF, likely to a LTC   Status is: Observation The patient will require care spanning > 2 midnights and should be moved to inpatient because: No safe discharge plan   Subjective:  Patient was seen and examined today.  No new concern.  Objective:    Vitals:   09/06/23 2009 09/06/23 2120 09/07/23 0308 09/07/23 0835  BP: (!) 133/92 (!) 140/88 (!) 144/94 (!) 167/107  Pulse: 91 89 84 79  Resp: 18  18 16   Temp: 98.6 F (37 C)  97.7 F (36.5 C) 98.6 F (37 C)  TempSrc:    Oral  SpO2: 100%  100% 100%  Weight:      Height:       No data found.   Intake/Output Summary (Last 24 hours) at 09/07/2023 1439 Last data filed at 09/07/2023 1017 Gross per 24 hour  Intake 120 ml  Output --  Net 120 ml    Filed Weights   09/03/23 1150 09/05/23 1235 09/05/23 1633  Weight: 64.7 kg 64.7  kg 63.5 kg    Exam: General.  Well-developed lady, in no acute distress. Pulmonary.  Lungs clear bilaterally, normal respiratory effort. CV.  Regular rate and rhythm, no JVD, rub or murmur. Abdomen.  Soft, nontender, nondistended, BS positive. CNS.  Alert and oriented .  No focal neurologic deficit. Extremities.  No edema, no cyanosis, pulses intact and symmetrical. Psychiatry.  Judgment and insight appears normal.   Data Reviewed:   I have personally reviewed following labs and imaging  studies:  Labs: Labs show the following:   Basic Metabolic Panel: Recent Labs  Lab 09/01/23 0737 09/03/23 0809 09/05/23 1258  NA 136 136 140  K 5.5* 5.1 5.1  CL 98 98 100  CO2 25 25 27   GLUCOSE 82 90 84  BUN 92* 96* 99*  CREATININE 8.31* 7.99* 7.68*  CALCIUM 8.1* 8.7* 8.5*  PHOS 5.4* 5.1* 5.1*   GFR Estimated Creatinine Clearance: 7.7 mL/min (A) (by C-G formula based on SCr of 7.68 mg/dL (H)). Liver Function Tests: Recent Labs  Lab 09/01/23 0737 09/03/23 0809 09/05/23 1258  ALBUMIN 3.4* 3.4* 3.6   No results for input(s): "LIPASE", "AMYLASE" in the last 168 hours. No results for input(s): "AMMONIA" in the last 168 hours. Coagulation profile No results for input(s): "INR", "PROTIME" in the last 168 hours.  CBC: Recent Labs  Lab 09/01/23 0737 09/03/23 0809 09/05/23 1258  WBC 5.5 4.6 4.8  HGB 7.7* 8.1* 8.2*  HCT 24.1* 25.1* 25.6*  MCV 84.9 83.4 87.1  PLT 235 234 215   Cardiac Enzymes: No results for input(s): "CKTOTAL", "CKMB", "CKMBINDEX", "TROPONINI" in the last 168 hours. BNP (last 3 results) No results for input(s): "PROBNP" in the last 8760 hours. CBG: No results for input(s): "GLUCAP" in the last 168 hours. D-Dimer: No results for input(s): "DDIMER" in the last 72 hours. Hgb A1c: No results for input(s): "HGBA1C" in the last 72 hours. Lipid Profile: No results for input(s): "CHOL", "HDL", "LDLCALC", "TRIG", "CHOLHDL", "LDLDIRECT" in the last 72  hours. Thyroid function studies: No results for input(s): "TSH", "T4TOTAL", "T3FREE", "THYROIDAB" in the last 72 hours.  Invalid input(s): "FREET3" Anemia work up: No results for input(s): "VITAMINB12", "FOLATE", "FERRITIN", "TIBC", "IRON", "RETICCTPCT" in the last 72 hours. Sepsis Labs: Recent Labs  Lab 09/01/23 0737 09/03/23 0809 09/05/23 1258  WBC 5.5 4.6 4.8    Microbiology No results found for this or any previous visit (from the past 240 hours).  Procedures and diagnostic studies:  No results found.    LOS: 0 days   Arnetha Courser MD  [facility]   Pager on [email or web address]. If 7PM-7AM, please contact night-coverage at [email or web address]  09/07/2023, 2:39 PM

## 2023-09-07 NOTE — Plan of Care (Signed)
   Problem: Education: Goal: Knowledge of General Education information will improve Description Including pain rating scale, medication(s)/side effects and non-pharmacologic comfort measures Outcome: Progressing

## 2023-09-07 NOTE — Plan of Care (Signed)
  Problem: Education: Goal: Understanding of cardiac disease, CV risk reduction, and recovery process will improve Outcome: Progressing   Problem: Activity: Goal: Ability to tolerate increased activity will improve Outcome: Progressing   Problem: Cardiac: Goal: Ability to achieve and maintain adequate cardiovascular perfusion will improve Outcome: Progressing   Problem: Health Behavior/Discharge Planning: Goal: Ability to safely manage health-related needs after discharge will improve Outcome: Progressing   Problem: Education: Goal: Knowledge of General Education information will improve Description: Including pain rating scale, medication(s)/side effects and non-pharmacologic comfort measures Outcome: Progressing   Problem: Health Behavior/Discharge Planning: Goal: Ability to manage health-related needs will improve Outcome: Progressing   Problem: Clinical Measurements: Goal: Ability to maintain clinical measurements within normal limits will improve Outcome: Progressing Goal: Will remain free from infection Outcome: Progressing Goal: Diagnostic test results will improve Outcome: Progressing Goal: Respiratory complications will improve Outcome: Progressing Goal: Cardiovascular complication will be avoided Outcome: Progressing   Problem: Activity: Goal: Risk for activity intolerance will decrease Outcome: Progressing   Problem: Nutrition: Goal: Adequate nutrition will be maintained Outcome: Progressing   Problem: Coping: Goal: Level of anxiety will decrease Outcome: Progressing   Problem: Elimination: Goal: Will not experience complications related to bowel motility Outcome: Progressing Goal: Will not experience complications related to urinary retention Outcome: Progressing   Problem: Pain Managment: Goal: General experience of comfort will improve and/or be controlled Outcome: Progressing   Problem: Safety: Goal: Ability to remain free from injury will  improve Outcome: Progressing   Problem: Skin Integrity: Goal: Risk for impaired skin integrity will decrease Outcome: Progressing

## 2023-09-07 NOTE — TOC Progression Note (Signed)
 Transition of Care [facility]) - Progression Note    Patient Details  Name: Allison Hill MRN: [id number] Date of Birth: 24-Apr-1971  [facility] of [facility] CM/SW Contact  Marlowe Sax, RN Phone Number: 09/07/2023, 1:56 PM  Clinical Narrative:    No beds available yet, looking for LTC will need Dialysis   Expected Discharge Plan:  (TBD) Barriers to Discharge: Continued Medical Work up  Expected Discharge Plan and Services     Post Acute Care Choice:  (TBD) Living arrangements for the past 2 months: Single Family Home                                       Social Determinants of Health (SDOH) Interventions SDOH Screenings   Food Insecurity: No Food Insecurity (08/10/2023)  Recent Concern: Food Insecurity - High Risk (07/17/2023)   Received from [facility]  Housing: Low Risk  (08/10/2023)  Recent Concern: Housing - High Risk (07/22/2023)   Received from [facility]  Transportation Needs: Unmet Transportation Needs (08/10/2023)  Utilities: Not At Risk (08/10/2023)  Recent Concern: Utilities - Medium Risk (07/17/2023)   Received from [facility]  Financial Resource Strain: Patient Declined (01/03/2023)   Received from [facility]  Social Connections: Unknown (08/10/2023)  Tobacco Use: High Risk (08/10/2023)    Readmission Risk Interventions     No data to display

## 2023-09-08 DIAGNOSIS — H6691 Otitis media, unspecified, right ear: Secondary | ICD-10-CM | POA: Diagnosis not present

## 2023-09-08 DIAGNOSIS — N186 End stage renal disease: Secondary | ICD-10-CM | POA: Diagnosis not present

## 2023-09-08 DIAGNOSIS — R079 Chest pain, unspecified: Secondary | ICD-10-CM | POA: Diagnosis not present

## 2023-09-08 DIAGNOSIS — I132 Hypertensive heart and chronic kidney disease with heart failure and with stage 5 chronic kidney disease, or end stage renal disease: Secondary | ICD-10-CM | POA: Diagnosis not present

## 2023-09-08 DIAGNOSIS — I160 Hypertensive urgency: Secondary | ICD-10-CM | POA: Diagnosis not present

## 2023-09-08 LAB — RENAL FUNCTION PANEL
Albumin: 3.5 g/dL (ref 3.5–5.0)
Anion gap: 12 (ref 5–15)
BUN: 107 mg/dL — ABNORMAL HIGH (ref 6–20)
CO2: 25 mmol/L (ref 22–32)
Calcium: 8.5 mg/dL — ABNORMAL LOW (ref 8.9–10.3)
Chloride: 101 mmol/L (ref 98–111)
Creatinine, Ser: 8.2 mg/dL — ABNORMAL HIGH (ref 0.44–1.00)
GFR, Estimated: 5 mL/min — ABNORMAL LOW (ref >60)
Glucose, Bld: 88 mg/dL (ref 70–99)
Phosphorus: 5 mg/dL — ABNORMAL HIGH (ref 2.5–4.6)
Potassium: 5 mmol/L (ref 3.5–5.1)
Sodium: 138 mmol/L (ref 135–145)

## 2023-09-08 LAB — CBC
HCT: 25.4 % — ABNORMAL LOW (ref 36.0–46.0)
Hemoglobin: 8.1 g/dL — ABNORMAL LOW (ref 12.0–15.0)
MCH: 27.1 pg (ref 26.0–34.0)
MCHC: 31.9 g/dL (ref 30.0–36.0)
MCV: 84.9 fL (ref 80.0–100.0)
Platelets: 201 10*3/uL (ref 150–400)
RBC: 2.99 MIL/uL — ABNORMAL LOW (ref 3.87–5.11)
RDW: 19 % — ABNORMAL HIGH (ref 11.5–15.5)
WBC: 4.9 10*3/uL (ref 4.0–10.5)
nRBC: 0 % (ref 0.0–0.2)

## 2023-09-08 LAB — HEPATITIS B SURFACE ANTIGEN: Hepatitis B Surface Ag: NONREACTIVE

## 2023-09-08 MED ORDER — ALTEPLASE 2 MG IJ SOLR: 2.0000 mg | Freq: Once | INTRAMUSCULAR | Status: DC | PRN

## 2023-09-08 MED ORDER — POLYETHYLENE GLYCOL 3350 17 G PO PACK
17.0000 g | PACK | Freq: Every day | ORAL | Status: DC
  Administered 2023-09-08 – 2023-09-21 (×13): 17 g via ORAL
  Filled 2023-09-08 (×16): qty 1

## 2023-09-08 MED ORDER — HEPARIN SODIUM (PORCINE) 1000 UNIT/ML DIALYSIS: 1000.0000 [IU] | INTRAMUSCULAR | Status: DC | PRN

## 2023-09-08 NOTE — Progress Notes (Signed)
 Received patient in bed to unit.  Alert and oriented.  Informed consent signed and in chart.   TX duration: 3 hours and 15 minutes  Treatment ended early due to high venous pressure and high TMP. Blood returned safely. 15 minutes remaining from machine. Nephrology NP informed.  Patient tolerated well.  Transported back to the room  Alert, without acute distress.  Hand-off given to patient's nurse.   Access used: dialysis catheter right chest Access issues: None  Total UF removed: 2.1L Medication(s) given: Epoetin 10,000 units Post HD VS: 167/99 Post HD weight: 65.7kg Standing post weight   Allison Hill [name]

## 2023-09-08 NOTE — Progress Notes (Signed)
 [facility]  ROUNDING NOTE   Subjective:   Patient seen and evaluated during dialysis.    HEMODIALYSIS FLOWSHEET:  Blood Flow Rate (mL/min): 399 mL/min Arterial Pressure (mmHg): -174.74 mmHg Venous Pressure (mmHg): 171.91 mmHg TMP (mmHg): 13.94 mmHg Ultrafiltration Rate (mL/min): 986 mL/min Dialysate Flow Rate (mL/min): 300 ml/min Dialysis Fluid Bolus: Normal Saline  Tolerating treatment well   Objective:  Vital signs in last 24 hours:  Temp:  [97.7 F (36.5 C)-98.3 F (36.8 C)] 98.3 F (36.8 C) (04/08 0757) Pulse Rate:  [79-89] 85 (04/08 1000) Resp:  [12-24] 13 (04/08 1000) BP: (132-192)/(91-116) 152/91 (04/08 1000) SpO2:  [99 %-100 %] 99 % (04/08 0930) Weight:  [65.3 kg] 65.3 kg (04/08 0757)  Weight change:  Filed Weights   09/05/23 1235 09/05/23 1633 09/08/23 0757  Weight: 64.7 kg 63.5 kg 65.3 kg    Intake/Output: I/O last 3 completed shifts: In: 357 [P.O.:357] Out: -    Intake/Output this shift:  No intake/output data recorded.  Physical Exam: General: NAD  Head: Normocephalic, atraumatic. Moist oral mucosal membranes  Eyes: Anicteric  Lungs:  Clear to auscultation  Heart: Regular rate and rhythm  Abdomen:  Soft, nontender  Extremities: No peripheral edema.  Neurologic: Alert, moving all four extremities  Skin: No lesions  Access: Rt Chest Permcath    Basic Metabolic Panel: Recent Labs  Lab 09/03/23 0809 09/05/23 1258 09/08/23 0806  NA 136 140 138  K 5.1 5.1 5.0  CL 98 100 101  CO2 25 27 25   GLUCOSE 90 84 88  BUN 96* 99* 107*  CREATININE 7.99* 7.68* 8.20*  CALCIUM 8.7* 8.5* 8.5*  PHOS 5.1* 5.1* 5.0*    Liver Function Tests: Recent Labs  Lab 09/03/23 0809 09/05/23 1258 09/08/23 0806  ALBUMIN 3.4* 3.6 3.5    No results for input(s): "LIPASE", "AMYLASE" in the last 168 hours. No results for input(s): "AMMONIA" in the last 168 hours.  CBC: Recent Labs  Lab 09/03/23 0809 09/05/23 1258 09/08/23 0805  WBC 4.6 4.8  4.9  HGB 8.1* 8.2* 8.1*  HCT 25.1* 25.6* 25.4*  MCV 83.4 87.1 84.9  PLT 234 215 201    Cardiac Enzymes: No results for input(s): "CKTOTAL", "CKMB", "CKMBINDEX", "TROPONINI" in the last 168 hours.  BNP: Invalid input(s): "POCBNP"  CBG: No results for input(s): "GLUCAP" in the last 168 hours.  Microbiology: Results for orders placed or performed during the hospital encounter of 08/09/23  Resp panel by RT-PCR (RSV, Flu A&B, Covid) Anterior Nasal Swab     Status: None   Collection Time: 08/14/23  3:44 AM   Specimen: Anterior Nasal Swab  Result Value Ref Range Status   SARS Coronavirus 2 by RT PCR NEGATIVE NEGATIVE Final    Comment: (NOTE) SARS-CoV-2 target nucleic acids are NOT DETECTED.  The SARS-CoV-2 RNA is generally detectable in upper respiratory specimens during the acute phase of infection. The lowest concentration of SARS-CoV-2 viral copies this assay can detect is 138 copies/mL. A negative result does not preclude SARS-Cov-2 infection and should not be used as the sole basis for treatment or other patient management decisions. A negative result may occur with  improper specimen collection/handling, submission of specimen other than nasopharyngeal swab, presence of viral mutation(s) within the areas targeted by this assay, and inadequate number of viral copies(<138 copies/mL). A negative result must be combined with clinical observations, patient history, and epidemiological information. The expected result is Negative.  Fact Sheet for Patients:  BloggerCourse.com  Fact Sheet for Healthcare  Providers:  SeriousBroker.it  This test is no t yet approved or cleared by the [place] and  has been authorized for detection and/or diagnosis of SARS-CoV-2 by FDA under an Emergency Use Authorization (EUA). This EUA will remain  in effect (meaning this test can be used) for the duration of the COVID-19 declaration  under Section 564(b)(1) of the Act, 21 U.S.C.section 360bbb-3(b)(1), unless the authorization is terminated  or revoked sooner.       Influenza A by PCR NEGATIVE NEGATIVE Final   Influenza B by PCR NEGATIVE NEGATIVE Final    Comment: (NOTE) The Xpert Xpress SARS-CoV-2/FLU/RSV plus assay is intended as an aid in the diagnosis of influenza from Nasopharyngeal swab specimens and should not be used as a sole basis for treatment. Nasal washings and aspirates are unacceptable for Xpert Xpress SARS-CoV-2/FLU/RSV testing.  Fact Sheet for Patients: BloggerCourse.com  Fact Sheet for Healthcare Providers: SeriousBroker.it  This test is not yet approved or cleared by the [place] FDA and has been authorized for detection and/or diagnosis of SARS-CoV-2 by FDA under an Emergency Use Authorization (EUA). This EUA will remain in effect (meaning this test can be used) for the duration of the COVID-19 declaration under Section 564(b)(1) of the Act, 21 U.S.C. section 360bbb-3(b)(1), unless the authorization is terminated or revoked.     Resp Syncytial Virus by PCR NEGATIVE NEGATIVE Final    Comment: (NOTE) Fact Sheet for Patients: BloggerCourse.com  Fact Sheet for Healthcare Providers: SeriousBroker.it  This test is not yet approved or cleared by the [place] FDA and has been authorized for detection and/or diagnosis of SARS-CoV-2 by FDA under an Emergency Use Authorization (EUA). This EUA will remain in effect (meaning this test can be used) for the duration of the COVID-19 declaration under Section 564(b)(1) of the Act, 21 U.S.C. section 360bbb-3(b)(1), unless the authorization is terminated or revoked.  Performed at [facility], [address] [place], [place] [address]   Respiratory (~20 pathogens) panel by PCR     Status: None   Collection Time: 08/14/23  3:44 AM    Specimen: Nasopharyngeal Swab; Respiratory  Result Value Ref Range Status   Adenovirus NOT DETECTED NOT DETECTED Final   Coronavirus 229E NOT DETECTED NOT DETECTED Final    Comment: (NOTE) The Coronavirus on the Respiratory Panel, DOES NOT test for the novel  Coronavirus (2019 nCoV)    Coronavirus HKU1 NOT DETECTED NOT DETECTED Final   Coronavirus NL63 NOT DETECTED NOT DETECTED Final   Coronavirus OC43 NOT DETECTED NOT DETECTED Final   Metapneumovirus NOT DETECTED NOT DETECTED Final   Rhinovirus / Enterovirus NOT DETECTED NOT DETECTED Final   Influenza A NOT DETECTED NOT DETECTED Final   Influenza B NOT DETECTED NOT DETECTED Final   Parainfluenza Virus 1 NOT DETECTED NOT DETECTED Final   Parainfluenza Virus 2 NOT DETECTED NOT DETECTED Final   Parainfluenza Virus 3 NOT DETECTED NOT DETECTED Final   Parainfluenza Virus 4 NOT DETECTED NOT DETECTED Final   Respiratory Syncytial Virus NOT DETECTED NOT DETECTED Final   Bordetella pertussis NOT DETECTED NOT DETECTED Final   Bordetella Parapertussis NOT DETECTED NOT DETECTED Final   Chlamydophila pneumoniae NOT DETECTED NOT DETECTED Final   Mycoplasma pneumoniae NOT DETECTED NOT DETECTED Final    Comment: Performed at [facility], [address], [place], [place] [address]  Culture, blood (Routine X 2) w Reflex to ID Panel     Status: None   Collection Time: 08/14/23  9:19  AM   Specimen: BLOOD  Result Value Ref Range Status   Specimen Description BLOOD BLOOD RIGHT HAND  Final   Special Requests   Final    BOTTLES DRAWN AEROBIC AND ANAEROBIC Blood Culture adequate volume   Culture   Final    NO GROWTH 5 DAYS Performed at [facility], [address] [place], [place] [address]    Report Status 08/19/2023 FINAL  Final  Culture, blood (Routine X 2) w Reflex to ID Panel     Status: None   Collection Time: 08/14/23  9:19 AM   Specimen: BLOOD  Result Value Ref Range Status   Specimen Description BLOOD BLOOD LEFT HAND   Final   Special Requests   Final    BOTTLES DRAWN AEROBIC AND ANAEROBIC Blood Culture adequate volume   Culture   Final    NO GROWTH 5 DAYS Performed at [facility], [address] [place], [place] [address]    Report Status 08/19/2023 FINAL  Final  Ear culture     Status: None   Collection Time: 08/14/23 10:11 AM   Specimen: Ear; Other  Result Value Ref Range Status   Specimen Description   Final    EAR Performed at [facility], [address] [place], [place] [address]    Special Requests   Final    RIGHT EAR Performed at [facility], [address] [place], [place] [address]    Culture RARE STREPTOCOCCUS PNEUMONIAE  Final   Report Status 08/16/2023 FINAL  Final   Organism ID, Bacteria STREPTOCOCCUS PNEUMONIAE  Final      Susceptibility   Streptococcus pneumoniae - MIC*    ERYTHROMYCIN <=0.12 SENSITIVE Sensitive     LEVOFLOXACIN 1 SENSITIVE Sensitive     VANCOMYCIN 0.5 SENSITIVE Sensitive     PENICILLIN (meningitis) <=0.06 SENSITIVE Sensitive     PENO - penicillin <=0.06      PENICILLIN (non-meningitis) <=0.06 SENSITIVE Sensitive     PENICILLIN (oral) <=0.06 SENSITIVE Sensitive     CEFTRIAXONE (non-meningitis) <=0.12 SENSITIVE Sensitive     CEFTRIAXONE (meningitis) <=0.12 SENSITIVE Sensitive     * RARE STREPTOCOCCUS PNEUMONIAE    Coagulation Studies: No results for input(s): "LABPROT", "INR" in the last 72 hours.  Urinalysis: No results for input(s): "COLORURINE", "LABSPEC", "PHURINE", "GLUCOSEU", "HGBUR", "BILIRUBINUR", "KETONESUR", "PROTEINUR", "UROBILINOGEN", "NITRITE", "LEUKOCYTESUR" in the last 72 hours.  Invalid input(s): "APPERANCEUR"    Imaging: No results found.    Medications:     amLODipine  10 mg Oral QPM   aspirin EC  81 mg Oral Daily   atorvastatin  40 mg Oral Daily   calcitRIOL  0.25 mcg Oral Q T,Th,Sa-HD   calcium carbonate  400 mg of elemental calcium Oral BID   Chlorhexidine Gluconate Cloth  6 each  Topical Q0600   cinacalcet  30 mg Oral Q supper   epoetin alfa  10,000 Units Intravenous Q T,Th,Sa-HD   fluticasone  1 spray Each Nare Daily   gabapentin  200 mg Oral BID   heparin  5,000 Units Subcutaneous Q12H   hydrALAZINE  100 mg Oral TID   hydrOXYzine  25 mg Oral TID   irbesartan  300 mg Oral QPM   isosorbide mononitrate  120 mg Oral Daily   labetalol  600 mg Oral BID   pantoprazole  40 mg Oral Daily   senna-docusate  2 tablet Oral BID   sevelamer carbonate  1,600 mg Oral TID with meals   terazosin  2  mg Oral BID   alteplase, guaiFENesin-dextromethorphan, heparin, ondansetron (ZOFRAN) IV, oxyCODONE  Assessment/ Plan:  Ms. Maguadalupe Lata is a 53 y.o.  female  with past medical conditions including hypertension, CHF, anemia, and end-stage renal disease on hemodialysis, who was admitted to [facility] on 08/09/2023 for Hypertensive urgency [I16.0] Chest pain [R07.9] Nonspecific chest pain [R07.9] Anemia due to chronic kidney disease, on chronic dialysis (HCC) [N18.6, D63.1, Z99.2]  Outpatient dialysis clinic has been arranged at [facility] on a TTS schedule, chair time at 530 am.    1.  Hypertensive urgency, on admission Amlodipine restarted.   Blood pressure stable with dialysis  2. Anemia of chronic kidney disease Hemoglobin & Hematocrit     Component Value Date/Time   HGB 8.1 (L) 09/08/2023 0805   HCT 25.4 (L) 09/08/2023 0805  Hemoglobin decreased.  Will continue IV EPO with  dialysis treatment.   3.  End stage renal disease on hemodialysis.  Receiving dialysis today, UF 2.5L as tolerated. Next treatment scheduled for Thursday.    4. Secondary Hyperparathyroidism:   Lab Results  Component Value Date   PTH 1,248 (H) 08/11/2023   CALCIUM 8.5 (L) 09/08/2023   PHOS 5.0 (H) 09/08/2023   Currently prescribed calcium carbonate, sevelamer and Cinacalcet. Continue calcitriol as prescribed.    LOS: 0 Jayme Cham 4/8/202510:47 AM

## 2023-09-08 NOTE — Plan of Care (Signed)
  Problem: Education: Goal: Knowledge of General Education information will improve Description: Including pain rating scale, medication(s)/side effects and non-pharmacologic comfort measures Outcome: Progressing   Problem: Health Behavior/Discharge Planning: Goal: Ability to manage health-related needs will improve Outcome: Progressing   Problem: Clinical Measurements: Goal: Respiratory complications will improve Outcome: Progressing   Problem: Clinical Measurements: Goal: Cardiovascular complication will be avoided Outcome: Progressing   Problem: Activity: Goal: Risk for activity intolerance will decrease Outcome: Progressing   Problem: Pain Managment: Goal: General experience of comfort will improve and/or be controlled Outcome: Progressing   Problem: Safety: Goal: Ability to remain free from injury will improve Outcome: Progressing

## 2023-09-08 NOTE — Plan of Care (Signed)
  Problem: Education: Goal: Understanding of cardiac disease, CV risk reduction, and recovery process will improve Outcome: Progressing   Problem: Activity: Goal: Ability to tolerate increased activity will improve Outcome: Progressing   Problem: Cardiac: Goal: Ability to achieve and maintain adequate cardiovascular perfusion will improve Outcome: Progressing   Problem: Health Behavior/Discharge Planning: Goal: Ability to safely manage health-related needs after discharge will improve Outcome: Progressing   Problem: Education: Goal: Knowledge of General Education information will improve Description: Including pain rating scale, medication(s)/side effects and non-pharmacologic comfort measures Outcome: Progressing   Problem: Health Behavior/Discharge Planning: Goal: Ability to manage health-related needs will improve Outcome: Progressing   Problem: Clinical Measurements: Goal: Ability to maintain clinical measurements within normal limits will improve Outcome: Progressing Goal: Will remain free from infection Outcome: Progressing Goal: Diagnostic test results will improve Outcome: Progressing Goal: Respiratory complications will improve Outcome: Progressing Goal: Cardiovascular complication will be avoided Outcome: Progressing   Problem: Activity: Goal: Risk for activity intolerance will decrease Outcome: Progressing   Problem: Nutrition: Goal: Adequate nutrition will be maintained Outcome: Progressing   Problem: Coping: Goal: Level of anxiety will decrease Outcome: Progressing   Problem: Elimination: Goal: Will not experience complications related to bowel motility Outcome: Progressing Goal: Will not experience complications related to urinary retention Outcome: Progressing   Problem: Pain Managment: Goal: General experience of comfort will improve and/or be controlled Outcome: Progressing   Problem: Safety: Goal: Ability to remain free from injury will  improve Outcome: Progressing   Problem: Skin Integrity: Goal: Risk for impaired skin integrity will decrease Outcome: Progressing

## 2023-09-08 NOTE — Progress Notes (Signed)
 Progress Note    Roger Fasnacht  WUJ:[id number] DOB: 1970/11/09  DOA: 08/09/2023 PCP: Melvenia Beam, MD      Brief Narrative:    Medical records reviewed and are as summarized below:  Allison Hill is a 53 y.o. female with medical history significant of ESRD on HD MWF, refractory HTN, chronic HFpEF, chronic iron deficiency anemia admitted to hospitalist service for evaluation of chest pain, hypertensive urgency.  Patient's chest pain atypical, troponins negative.  Blood pressure improved with home regimen.  Patient lives with her daughter who does not want to take her home given high risk for falls, not safe for her HD transportation.  HD outpatient facility arranged.  TOC working on placement   On 08/13/2023/08/14/2023 patient is complaining of severe pain in her right ear that is not controlled by tylenol. The pain has not improved with IV Unasyn. CT head demonstrated mastoiditis and otitis media with swelling of the external canal. The patient was given IV Fentanyl for pain and antibiotics were expanded to include cipro PO. She will undergo dialysis on Saturday as part of her usual schedule.  PT/OT recommended nursing home placement, however insurance company denied the application.  4/2: Remained medically stable.  Difficult disposition as daughter does not want her coming back to her home.  TOC is working on it.  4/3: Had her HD today.  Awaiting placement  4/5: HD today.  Still awaiting placement  4/6: Remains stable.  Had a fall yesterday evening with no injuries  4/8: Remained stable.  Had her dialysis today.   Assessment/Plan:   Principal Problem:   Nonspecific chest pain Active Problems:   Otitis media not resolved, right   Hypertensive urgency   ESRD on dialysis (HCC)   Right upper quadrant abdominal pain   Body mass index is 25.66 kg/m.   Right otitis media Resolved. Completed 7 days of antibiotics on 08/20/2023 (including IV Unasyn followed  by ciprofloxacin and Levaquin).    Hypertensive urgency Orthostatic hypotension. Fluctuating BP.  BP  significantly elevated.  Increase amlodipine again from 5 to 10 mg daily.   Continue amlodipine, labetalol, losartan, terazosin and hydralazine.    ESRD on dialysis (HCC) Hyperkalemia Hypocalcemia Anemia of chronic kidney disease Plan for hemodialysis today Continue Epogen, Cinacalcet, sevelamer and calcium carbonate Follow-up with nephrologist for hemodialysis  Chest pain Resolved   Right upper quadrant abdominal pain Improved.  Continue Protonix US of the RUQ of the abdomen is negative for cholecystitis or cholelithiasis.     General weakness No safe discharge plan.  Awaiting placement to long-term care facility Follow-up with transition of care team to assist with disposition  Analgesics as needed for shoulder osteoarthritis  Patient has been informed that insurance authorization for SNF was denied.  She said she has nowhere to go.  She is going to discuss this further with her daughter and come up with a plan.  Discussed with Deliliah, case Production designer, theatre/television/film.  She said patient may have to pay out-of-pocket to get to SNF.  Follow-up with TOC to assist with disposition.   Diet Order             Diet Heart Room service appropriate? Yes; Fluid consistency: Thin  Diet effective now                  Consultants: Nephrologist  Procedures: None   Medications:    amLODipine  10 mg Oral QPM   aspirin EC  81 mg Oral Daily  atorvastatin  40 mg Oral Daily   calcitRIOL  0.25 mcg Oral Q T,Th,Sa-HD   calcium carbonate  400 mg of elemental calcium Oral BID   Chlorhexidine Gluconate Cloth  6 each Topical Q0600   cinacalcet  30 mg Oral Q supper   epoetin alfa  10,000 Units Intravenous Q T,Th,Sa-HD   fluticasone  1 spray Each Nare Daily   gabapentin  200 mg Oral BID   heparin  5,000 Units Subcutaneous Q12H   hydrALAZINE  100 mg Oral TID   hydrOXYzine  25 mg Oral TID    irbesartan  300 mg Oral QPM   isosorbide mononitrate  120 mg Oral Daily   labetalol  600 mg Oral BID   pantoprazole  40 mg Oral Daily   senna-docusate  2 tablet Oral BID   sevelamer carbonate  1,600 mg Oral TID with meals   terazosin  2 mg Oral BID   Continuous Infusions:   Anti-infectives (From admission, onward)    Start     Dose/Rate Route Frequency Ordered Stop   08/19/23 1800  levofloxacin (LEVAQUIN) tablet 250 mg        250 mg Oral Every evening 08/19/23 1504 08/20/23 1739   08/15/23 1800  ciprofloxacin (CIPRO) tablet 500 mg  Status:  Discontinued        500 mg Oral Every evening 08/14/23 0856 08/19/23 1502   08/14/23 0915  ciprofloxacin (CIPRO) tablet 500 mg        500 mg Oral  Once 08/14/23 0856 08/14/23 1001   08/14/23 0615  Ampicillin-Sulbactam (UNASYN) 3 g in sodium chloride 0.9 % 100 mL IVPB  Status:  Discontinued        3 g 200 mL/hr over 30 Minutes Intravenous Every 12 hours 08/14/23 0528 08/16/23 1427        Family Communication/Anticipated D/C date and plan/Code Status   DVT prophylaxis: heparin injection 5,000 Units Start: 08/09/23 1000     Code Status: Full Code  Family Communication: None Disposition Plan: Plan to discharge to SNF, likely to a LTC   Status is: Observation The patient will require care spanning > 2 midnights and should be moved to inpatient because: No safe discharge plan   Subjective:  Patient was seen during dialysis today.  No new concern.  Objective:    Vitals:   09/08/23 1100 09/08/23 1130 09/08/23 1155 09/08/23 1242  BP: (!) 155/103 (!) 159/103 (!) 167/99 (!) 177/105  Pulse: 86 90 84 85  Resp: 12 (!) 22 17 18   Temp:   98.2 F (36.8 C) 98.8 F (37.1 C)  TempSrc:   Oral Oral  SpO2: 100% 100% 100% 100%  Weight:   65.7 kg   Height:       No data found.   Intake/Output Summary (Last 24 hours) at 09/08/2023 1345 Last data filed at 09/08/2023 1155 Gross per 24 hour  Intake 237 ml  Output 2.1 ml  Net 234.9 ml     Filed Weights   09/05/23 1633 09/08/23 0757 09/08/23 1155  Weight: 63.5 kg 65.3 kg 65.7 kg    Exam: General.  Well-developed lady, in no acute distress. Pulmonary.  Lungs clear bilaterally, normal respiratory effort. CV.  Regular rate and rhythm, no JVD, rub or murmur. Abdomen.  Soft, nontender, nondistended, BS positive. CNS.  Alert and oriented .  No focal neurologic deficit. Extremities.  No edema, no cyanosis, pulses intact and symmetrical. Psychiatry.  Judgment and insight appears normal.    Data Reviewed:  I have personally reviewed following labs and imaging studies:  Labs: Labs show the following:   Basic Metabolic Panel: Recent Labs  Lab 09/03/23 0809 09/05/23 1258 09/08/23 0806  NA 136 140 138  K 5.1 5.1 5.0  CL 98 100 101  CO2 25 27 25   GLUCOSE 90 84 88  BUN 96* 99* 107*  CREATININE 7.99* 7.68* 8.20*  CALCIUM 8.7* 8.5* 8.5*  PHOS 5.1* 5.1* 5.0*   GFR Estimated Creatinine Clearance: 7.3 mL/min (A) (by C-G formula based on SCr of 8.2 mg/dL (H)). Liver Function Tests: Recent Labs  Lab 09/03/23 0809 09/05/23 1258 09/08/23 0806  ALBUMIN 3.4* 3.6 3.5   No results for input(s): "LIPASE", "AMYLASE" in the last 168 hours. No results for input(s): "AMMONIA" in the last 168 hours. Coagulation profile No results for input(s): "INR", "PROTIME" in the last 168 hours.  CBC: Recent Labs  Lab 09/03/23 0809 09/05/23 1258 09/08/23 0805  WBC 4.6 4.8 4.9  HGB 8.1* 8.2* 8.1*  HCT 25.1* 25.6* 25.4*  MCV 83.4 87.1 84.9  PLT 234 215 201   Cardiac Enzymes: No results for input(s): "CKTOTAL", "CKMB", "CKMBINDEX", "TROPONINI" in the last 168 hours. BNP (last 3 results) No results for input(s): "PROBNP" in the last 8760 hours. CBG: No results for input(s): "GLUCAP" in the last 168 hours. D-Dimer: No results for input(s): "DDIMER" in the last 72 hours. Hgb A1c: No results for input(s): "HGBA1C" in the last 72 hours. Lipid Profile: No results for  input(s): "CHOL", "HDL", "LDLCALC", "TRIG", "CHOLHDL", "LDLDIRECT" in the last 72 hours. Thyroid function studies: No results for input(s): "TSH", "T4TOTAL", "T3FREE", "THYROIDAB" in the last 72 hours.  Invalid input(s): "FREET3" Anemia work up: No results for input(s): "VITAMINB12", "FOLATE", "FERRITIN", "TIBC", "IRON", "RETICCTPCT" in the last 72 hours. Sepsis Labs: Recent Labs  Lab 09/03/23 0809 09/05/23 1258 09/08/23 0805  WBC 4.6 4.8 4.9    Microbiology No results found for this or any previous visit (from the past 240 hours).  Procedures and diagnostic studies:  No results found.    LOS: 0 days   Arnetha Courser MD  [facility]   Pager on [email or web address]. If 7PM-7AM, please contact night-coverage at [email or web address]  09/08/2023, 1:45 PM

## 2023-09-08 NOTE — Progress Notes (Signed)
 OT Cancellation Note  Patient Details Name: Lakota Schweppe MRN: [id number] DOB: [birth year]   Cancelled Treatment:    Reason Eval/Treat Not Completed: Patient at procedure or test/ unavailable. Pt at dialysis. Will re-attempt OT tx at later date/time as pt is available and appropriate.   Arman Filter., MPH, MS, OTR/L ascom [phone] 09/08/23, 8:13 AM

## 2023-09-09 DIAGNOSIS — I160 Hypertensive urgency: Secondary | ICD-10-CM | POA: Diagnosis not present

## 2023-09-09 DIAGNOSIS — R079 Chest pain, unspecified: Secondary | ICD-10-CM | POA: Diagnosis not present

## 2023-09-09 DIAGNOSIS — I132 Hypertensive heart and chronic kidney disease with heart failure and with stage 5 chronic kidney disease, or end stage renal disease: Secondary | ICD-10-CM | POA: Diagnosis not present

## 2023-09-09 DIAGNOSIS — N186 End stage renal disease: Secondary | ICD-10-CM | POA: Diagnosis not present

## 2023-09-09 DIAGNOSIS — H6691 Otitis media, unspecified, right ear: Secondary | ICD-10-CM | POA: Diagnosis not present

## 2023-09-09 NOTE — Plan of Care (Signed)
  Problem: Activity: Goal: Ability to tolerate increased activity will improve Outcome: Progressing   Problem: Education: Goal: Knowledge of General Education information will improve Description: Including pain rating scale, medication(s)/side effects and non-pharmacologic comfort measures Outcome: Progressing   Problem: Activity: Goal: Risk for activity intolerance will decrease Outcome: Progressing   Problem: Pain Managment: Goal: General experience of comfort will improve and/or be controlled Outcome: Progressing   Problem: Safety: Goal: Ability to remain free from injury will improve Outcome: Progressing

## 2023-09-09 NOTE — TOC Progression Note (Signed)
 Transition of Care [facility]) - Progression Note    Patient Details  Name: Allison Hill MRN: [id number] Date of Birth: 1970-09-09  Transition of Care [facility]) CM/SW Contact  Marlowe Sax, RN Phone Number: 09/09/2023, 3:31 PM  Clinical Narrative:     No bed offers at this time, needs Dilaysis and Long term care  Expected Discharge Plan:  (TBD) Barriers to Discharge: Continued Medical Work up  Expected Discharge Plan and Services     Post Acute Care Choice:  (TBD) Living arrangements for the past 2 months: Single Family Home                                       Social Determinants of Health (SDOH) Interventions SDOH Screenings   Food Insecurity: No Food Insecurity (08/10/2023)  Recent Concern: Food Insecurity - High Risk (07/17/2023)   Received from [facility]  Housing: Low Risk  (08/10/2023)  Recent Concern: Housing - High Risk (07/22/2023)   Received from [facility]  Transportation Needs: Unmet Transportation Needs (08/10/2023)  Utilities: Not At Risk (08/10/2023)  Recent Concern: Utilities - Medium Risk (07/17/2023)   Received from [facility]  Financial Resource Strain: Patient Declined (01/03/2023)   Received from [facility]  Social Connections: Unknown (08/10/2023)  Tobacco Use: High Risk (08/10/2023)    Readmission Risk Interventions     No data to display

## 2023-09-09 NOTE — Plan of Care (Signed)
  Problem: Activity: Goal: Ability to tolerate increased activity will improve Outcome: Progressing   Problem: Education: Goal: Knowledge of General Education information will improve Description: Including pain rating scale, medication(s)/side effects and non-pharmacologic comfort measures Outcome: Progressing   

## 2023-09-09 NOTE — Progress Notes (Signed)
 Mobility Specialist - Progress Note   09/09/23 1200  Mobility  Activity Dangled on edge of bed  Level of Assistance Standby assist, set-up cues, supervision of patient - no hands on  Range of Motion/Exercises Active;Active Assistive;Right leg;Left leg  Activity Response Tolerated well  Mobility visit 1 Mobility     Pt sitting EOB on arrival, utilizing RA. RN at bedside getting VS. BP 129/89 however pt reports feeling how she normally feels when her BP is elevated. Declined OOB activity, but agreeable to doing EOB exercises (SLR, heel raises). Pt reports feeling weak, dizzy, and like she's falling over despite having good sitting balance. Session concluded at pt request to return supine. Scoot transfer to [facility]. Alarm set, needs in reach.    Filiberto Pinks Mobility Specialist 09/09/23, 12:10 PM

## 2023-09-09 NOTE — Progress Notes (Signed)
 Progress Note    Allison Hill  VWU:[id number] DOB: 1970/08/15  DOA: 08/09/2023 PCP: Melvenia Beam, MD      Brief Narrative:    Medical records reviewed and are as summarized below:  Allison Hill is a 53 y.o. female with medical history significant of ESRD on HD MWF, refractory HTN, chronic HFpEF, chronic iron deficiency anemia admitted to hospitalist service for evaluation of chest pain, hypertensive urgency.  Patient's chest pain atypical, troponins negative.  Blood pressure improved with home regimen.  Patient lives with her daughter who does not want to take her home given high risk for falls, not safe for her HD transportation.  HD outpatient facility arranged.  TOC working on placement   On 08/13/2023/08/14/2023 patient is complaining of severe pain in her right ear that is not controlled by tylenol. The pain has not improved with IV Unasyn. CT head demonstrated mastoiditis and otitis media with swelling of the external canal. The patient was given IV Fentanyl for pain and antibiotics were expanded to include cipro PO. She will undergo dialysis on Saturday as part of her usual schedule.  PT/OT recommended nursing home placement, however insurance company denied the application.  4/2: Remained medically stable.  Difficult disposition as daughter does not want her coming back to her home.  TOC is working on it.  4/3: Had her HD today.  Awaiting placement  4/5: HD today.  Still awaiting placement  4/6: Remains stable.  Had a fall yesterday evening with no injuries  4/8: Remained stable.  Had her dialysis today.   Assessment/Plan:   Principal Problem:   Nonspecific chest pain Active Problems:   Otitis media not resolved, right   Hypertensive urgency   ESRD on dialysis (HCC)   Right upper quadrant abdominal pain   Body mass index is 25.66 kg/m.   Right otitis media Resolved. Completed 7 days of antibiotics on 08/20/2023 (including IV Unasyn followed  by ciprofloxacin and Levaquin).    Hypertensive urgency Orthostatic hypotension. Fluctuating BP.  BP  significantly elevated.  Increase amlodipine again from 5 to 10 mg daily.   Continue amlodipine, labetalol, losartan, terazosin and hydralazine.    ESRD on dialysis (HCC) Hyperkalemia Hypocalcemia Anemia of chronic kidney disease Plan for hemodialysis today Continue Epogen, Cinacalcet, sevelamer and calcium carbonate Follow-up with nephrologist for hemodialysis  Chest pain Resolved   Right upper quadrant abdominal pain Improved.  Continue Protonix US of the RUQ of the abdomen is negative for cholecystitis or cholelithiasis.     General weakness No safe discharge plan.  Awaiting placement to long-term care facility Follow-up with transition of care team to assist with disposition  Analgesics as needed for shoulder osteoarthritis  Patient has been informed that insurance authorization for SNF was denied.  She said she has nowhere to go.  She is going to discuss this further with her daughter and come up with a plan.  Discussed with Deliliah, case Production designer, theatre/television/film.  She said patient may have to pay out-of-pocket to get to SNF.  Follow-up with TOC to assist with disposition.   Diet Order             Diet Heart Room service appropriate? Yes; Fluid consistency: Thin  Diet effective now                  Consultants: Nephrologist  Procedures: None   Medications:    amLODipine  10 mg Oral QPM   aspirin EC  81 mg Oral Daily  atorvastatin  40 mg Oral Daily   calcitRIOL  0.25 mcg Oral Q T,Th,Sa-HD   calcium carbonate  400 mg of elemental calcium Oral BID   Chlorhexidine Gluconate Cloth  6 each Topical Q0600   cinacalcet  30 mg Oral Q supper   epoetin alfa  10,000 Units Intravenous Q T,Th,Sa-HD   fluticasone  1 spray Each Nare Daily   gabapentin  200 mg Oral BID   heparin  5,000 Units Subcutaneous Q12H   hydrALAZINE  100 mg Oral TID   hydrOXYzine  25 mg Oral TID    irbesartan  300 mg Oral QPM   isosorbide mononitrate  120 mg Oral Daily   labetalol  600 mg Oral BID   pantoprazole  40 mg Oral Daily   polyethylene glycol  17 g Oral Daily   senna-docusate  2 tablet Oral BID   sevelamer carbonate  1,600 mg Oral TID with meals   terazosin  2 mg Oral BID   Continuous Infusions:   Anti-infectives (From admission, onward)    Start     Dose/Rate Route Frequency Ordered Stop   08/19/23 1800  levofloxacin (LEVAQUIN) tablet 250 mg        250 mg Oral Every evening 08/19/23 1504 08/20/23 1739   08/15/23 1800  ciprofloxacin (CIPRO) tablet 500 mg  Status:  Discontinued        500 mg Oral Every evening 08/14/23 0856 08/19/23 1502   08/14/23 0915  ciprofloxacin (CIPRO) tablet 500 mg        500 mg Oral  Once 08/14/23 0856 08/14/23 1001   08/14/23 0615  Ampicillin-Sulbactam (UNASYN) 3 g in sodium chloride 0.9 % 100 mL IVPB  Status:  Discontinued        3 g 200 mL/hr over 30 Minutes Intravenous Every 12 hours 08/14/23 0528 08/16/23 1427        Family Communication/Anticipated D/C date and plan/Code Status   DVT prophylaxis: heparin injection 5,000 Units Start: 08/09/23 1000     Code Status: Full Code  Family Communication: None Disposition Plan: Plan to discharge to SNF, likely to a LTC   Status is: Observation The patient will require care spanning > 2 midnights and should be moved to inpatient because: No safe discharge plan   Subjective:  Patient was seen today.  No new concern  Objective:    Vitals:   09/08/23 2131 09/08/23 2132 09/09/23 0736 09/09/23 1130  BP: (!) 149/87 (!) 149/87 137/83 129/89  Pulse: 86  87 87  Resp:   17   Temp:   98.8 F (37.1 C)   TempSrc:      SpO2:   99% 100%  Weight:      Height:       No data found.   Intake/Output Summary (Last 24 hours) at 09/09/2023 1508 Last data filed at 09/09/2023 1041 Gross per 24 hour  Intake 120 ml  Output --  Net 120 ml    Filed Weights   09/05/23 1633 09/08/23 0757  09/08/23 1155  Weight: 63.5 kg 65.3 kg 65.7 kg    Exam: General.  Well-developed lady, in no acute distress. Pulmonary.  Lungs clear bilaterally, normal respiratory effort. CV.  Regular rate and rhythm, no JVD, rub or murmur. Abdomen.  Soft, nontender, nondistended, BS positive. CNS.  Alert and oriented .  No focal neurologic deficit. Extremities.  No edema, no cyanosis, pulses intact and symmetrical. Psychiatry.  Judgment and insight appears normal.   Data Reviewed:   I  have personally reviewed following labs and imaging studies:  Labs: Labs show the following:   Basic Metabolic Panel: Recent Labs  Lab 09/03/23 0809 09/05/23 1258 09/08/23 0806  NA 136 140 138  K 5.1 5.1 5.0  CL 98 100 101  CO2 25 27 25   GLUCOSE 90 84 88  BUN 96* 99* 107*  CREATININE 7.99* 7.68* 8.20*  CALCIUM 8.7* 8.5* 8.5*  PHOS 5.1* 5.1* 5.0*   GFR Estimated Creatinine Clearance: 7.3 mL/min (A) (by C-G formula based on SCr of 8.2 mg/dL (H)). Liver Function Tests: Recent Labs  Lab 09/03/23 0809 09/05/23 1258 09/08/23 0806  ALBUMIN 3.4* 3.6 3.5   No results for input(s): "LIPASE", "AMYLASE" in the last 168 hours. No results for input(s): "AMMONIA" in the last 168 hours. Coagulation profile No results for input(s): "INR", "PROTIME" in the last 168 hours.  CBC: Recent Labs  Lab 09/03/23 0809 09/05/23 1258 09/08/23 0805  WBC 4.6 4.8 4.9  HGB 8.1* 8.2* 8.1*  HCT 25.1* 25.6* 25.4*  MCV 83.4 87.1 84.9  PLT 234 215 201   Cardiac Enzymes: No results for input(s): "CKTOTAL", "CKMB", "CKMBINDEX", "TROPONINI" in the last 168 hours. BNP (last 3 results) No results for input(s): "PROBNP" in the last 8760 hours. CBG: No results for input(s): "GLUCAP" in the last 168 hours. D-Dimer: No results for input(s): "DDIMER" in the last 72 hours. Hgb A1c: No results for input(s): "HGBA1C" in the last 72 hours. Lipid Profile: No results for input(s): "CHOL", "HDL", "LDLCALC", "TRIG", "CHOLHDL",  "LDLDIRECT" in the last 72 hours. Thyroid function studies: No results for input(s): "TSH", "T4TOTAL", "T3FREE", "THYROIDAB" in the last 72 hours.  Invalid input(s): "FREET3" Anemia work up: No results for input(s): "VITAMINB12", "FOLATE", "FERRITIN", "TIBC", "IRON", "RETICCTPCT" in the last 72 hours. Sepsis Labs: Recent Labs  Lab 09/03/23 0809 09/05/23 1258 09/08/23 0805  WBC 4.6 4.8 4.9    Microbiology No results found for this or any previous visit (from the past 240 hours).  Procedures and diagnostic studies:  No results found.    LOS: 0 days   Arnetha Courser MD  [facility]   Pager on [email or web address]. If 7PM-7AM, please contact night-coverage at [email or web address]  09/09/2023, 3:08 PM

## 2023-09-09 NOTE — Progress Notes (Signed)
 Occupational Therapy Re-evaluation  Patient Details Name: Allison Hill MRN: [id number] DOB: 04-12-1971 Today's Date: 09/09/2023   History of present illness Pt is a 53 y.o. female with medical history significant of ESRD on HD MWF, refractory HTN, chronic HFpEF, chronic iron deficiency anemia presented with new onset of chest pain, workup for hypertensive emergency.   OT comments  Chart reviewed to date, pt greeted in bed, feeling unwell but agreeable to ADL participation. Increased time for one step direction following. Pt reports she feels like her LLE feels more "numb", nurse notified. Vitals taken pre/post mobility BP semi supine in bed 122/75 HR 94 bpm, spo2 99% on RA; in chair after mobility BP125/80 HR 94 bpm, spo2 99% on RA.Slightly increased assist for mobility on this date, CGA-MIN A in room to bedside chair with intermittent vcs for technique. Slow progress towards goals, OT will continue to follow acutely.      If plan is discharge home, recommend the following:  A little help with walking and/or transfers;A little help with bathing/dressing/bathroom;Assistance with cooking/housework;Assistance with feeding;Direct supervision/assist for medications management;Direct supervision/assist for financial management;Assist for transportation;Help with stairs or ramp for entrance;Supervision due to cognitive status   Equipment Recommendations  None recommended by OT    Recommendations for Other Services      Precautions / Restrictions Precautions Precautions: Fall Recall of Precautions/Restrictions: Impaired Restrictions Weight Bearing Restrictions Per Provider Order: No       Mobility Bed Mobility Overal bed mobility: Modified Independent                  Transfers Overall transfer level: Needs assistance Equipment used: Rollator (4 wheels) Transfers: Sit to/from Stand Sit to Stand: Contact guard assist                 Balance Overall balance assessment:  Needs assistance Sitting-balance support: Feet supported Sitting balance-Leahy Scale: Good     Standing balance support: Bilateral upper extremity supported, During functional activity, Reliant on assistive device for balance Standing balance-Leahy Scale: Fair                             ADL either performed or assessed with clinical judgement   ADL Overall ADL's : Needs assistance/impaired     Grooming: Wash/dry face;Sitting;Supervision/safety   Upper Body Bathing: Minimal assistance;Sitting Upper Body Bathing Details (indicate cue type and reason): for thoroughness     Upper Body Dressing : Supervision/safety;Sitting   Lower Body Dressing: Contact guard assist;Sitting/lateral leans Lower Body Dressing Details (indicate cue type and reason): donn crocs Toilet Transfer: Rollator (4 wheels);Ambulation;Minimal assistance Toilet Transfer Details (indicate cue type and reason): simulated to bedside chair         Functional mobility during ADLs: Contact guard assist;Minimal assistance;Rollator (4 wheels) General ADL Comments: slightly increased assist on this date, pt reports just not feeling well    Extremity/Trunk Assessment Upper Extremity Assessment Upper Extremity Assessment: LUE deficits/detail LUE Deficits / Details: AROM to approx 1/2 full ROM, elbow/wrist approx [facility]   Lower Extremity Assessment LLE Deficits / Details: reports L leg feels "different" nurse notified        Vision Patient Visual Report: No change from baseline Vision Assessment?: Yes Tracking/Visual Pursuits: Able to track stimulus in all quads without difficulty Convergence: Within functional limits Visual Fields: No apparent deficits   Perception     Praxis     Communication Communication Communication: Impaired Factors Affecting Communication: Difficulty expressing self (pt  reports feeling more "slurred")   Cognition Arousal: Alert Behavior During Therapy: WFL for tasks  assessed/performed Cognition: No family/caregiver present to determine baseline   Orientation impairments: Time, Situation Awareness: Online awareness impaired Memory impairment (select all impairments): Declarative long-term memory Attention impairment (select first level of impairment): Sustained attention Executive functioning impairment (select all impairments): Reasoning, Problem solving, Sequencing                   Following commands: Impaired Following commands impaired: Follows multi-step commands inconsistently, Follows one step commands with increased time      Cueing   Cueing Techniques: Verbal cues, Tactile cues  Exercises Other Exercises Other Exercises: educated re: safe ADL completion    Shoulder Instructions       General Comments BP semi supine in bed 122/75 HR 94 bpm, spo2 99% on RA; in chair after mobility BP125/80 HR 94 bpm ,spo2 99% on RA    Pertinent Vitals/ Pain       Pain Assessment Pain Assessment: Faces Faces Pain Scale: Hurts little more Pain Location: L shoulder, generalized Pain Descriptors / Indicators: Aching, Discomfort Pain Intervention(s): Limited activity within patient's tolerance, Monitored during session, Premedicated before session, Repositioned  Home Living                                          Prior Functioning/Environment              Frequency  Min 1X/week        Progress Toward Goals  OT Goals(current goals can now be found in the care plan section)  Progress towards OT goals: Progressing toward goals  Acute Rehab OT Goals Time For Goal Achievement: 09/23/23  Plan      Co-evaluation                 AM-PAC OT "6 Clicks" Daily Activity     Outcome Measure   Help from another person eating meals?: None Help from another person taking care of personal grooming?: None Help from another person toileting, which includes using toliet, bedpan, or urinal?: A Little Help from  another person bathing (including washing, rinsing, drying)?: A Little Help from another person to put on and taking off regular upper body clothing?: A Little Help from another person to put on and taking off regular lower body clothing?: A Little 6 Click Score: 20    End of Session Equipment Utilized During Treatment: Rollator (4 wheels)  OT Visit Diagnosis: Unsteadiness on feet (R26.81);Other abnormalities of gait and mobility (R26.89);Muscle weakness (generalized) (M62.81)   Activity Tolerance Patient limited by fatigue   Patient Left in chair;with call bell/phone within reach;with chair alarm set   Nurse Communication Mobility status        Time: 5573-2202 OT Time Calculation (min): 23 min  Charges: OT General Charges $OT Visit: 1 Visit OT Treatments $Self Care/Home Management : 23-37 mins  Oleta Mouse, OTD OTR/L  09/09/23, 3:57 PM

## 2023-09-10 DIAGNOSIS — I160 Hypertensive urgency: Secondary | ICD-10-CM | POA: Diagnosis not present

## 2023-09-10 DIAGNOSIS — H6691 Otitis media, unspecified, right ear: Secondary | ICD-10-CM | POA: Diagnosis not present

## 2023-09-10 DIAGNOSIS — N186 End stage renal disease: Secondary | ICD-10-CM | POA: Diagnosis not present

## 2023-09-10 DIAGNOSIS — I132 Hypertensive heart and chronic kidney disease with heart failure and with stage 5 chronic kidney disease, or end stage renal disease: Secondary | ICD-10-CM | POA: Diagnosis not present

## 2023-09-10 DIAGNOSIS — R079 Chest pain, unspecified: Secondary | ICD-10-CM | POA: Diagnosis not present

## 2023-09-10 LAB — CBC
HCT: 27.3 % — ABNORMAL LOW (ref 36.0–46.0)
Hemoglobin: 8.6 g/dL — ABNORMAL LOW (ref 12.0–15.0)
MCH: 27.5 pg (ref 26.0–34.0)
MCHC: 31.5 g/dL (ref 30.0–36.0)
MCV: 87.2 fL (ref 80.0–100.0)
Platelets: 211 10*3/uL (ref 150–400)
RBC: 3.13 MIL/uL — ABNORMAL LOW (ref 3.87–5.11)
RDW: 18.6 % — ABNORMAL HIGH (ref 11.5–15.5)
WBC: 4.4 10*3/uL (ref 4.0–10.5)
nRBC: 0 % (ref 0.0–0.2)

## 2023-09-10 LAB — RENAL FUNCTION PANEL
Albumin: 3.7 g/dL (ref 3.5–5.0)
Anion gap: 14 (ref 5–15)
BUN: 97 mg/dL — ABNORMAL HIGH (ref 6–20)
CO2: 23 mmol/L (ref 22–32)
Calcium: 8.6 mg/dL — ABNORMAL LOW (ref 8.9–10.3)
Chloride: 100 mmol/L (ref 98–111)
Creatinine, Ser: 7.5 mg/dL — ABNORMAL HIGH (ref 0.44–1.00)
GFR, Estimated: 6 mL/min — ABNORMAL LOW (ref >60)
Glucose, Bld: 79 mg/dL (ref 70–99)
Phosphorus: 4.8 mg/dL — ABNORMAL HIGH (ref 2.5–4.6)
Potassium: 4.7 mmol/L (ref 3.5–5.1)
Sodium: 137 mmol/L (ref 135–145)

## 2023-09-10 MED ORDER — HEPARIN SODIUM (PORCINE) 1000 UNIT/ML DIALYSIS: 1000.0000 [IU] | INTRAMUSCULAR | Status: DC | PRN

## 2023-09-10 MED ORDER — ALTEPLASE 2 MG IJ SOLR: 2.0000 mg | Freq: Once | INTRAMUSCULAR | Status: DC | PRN

## 2023-09-10 NOTE — Plan of Care (Signed)
  Problem: Activity: Goal: Ability to tolerate increased activity will improve Outcome: Progressing   Problem: Education: Goal: Knowledge of General Education information will improve Description: Including pain rating scale, medication(s)/side effects and non-pharmacologic comfort measures Outcome: Progressing   

## 2023-09-10 NOTE — Progress Notes (Signed)
 Progress Note    Allison Hill  ZOX:[id number] DOB: [birth year]  DOA: 08/09/2023 PCP: Melvenia Beam, MD      Brief Narrative:    Medical records reviewed and are as summarized below:  Allison Hill is a 53 y.o. female with medical history significant of ESRD on HD MWF, refractory HTN, chronic HFpEF, chronic iron deficiency anemia admitted to hospitalist service for evaluation of chest pain, hypertensive urgency.  Patient's chest pain atypical, troponins negative.  Blood pressure improved with home regimen.  Patient lives with her daughter who does not want to take her home given high risk for falls, not safe for her HD transportation.  HD outpatient facility arranged.  TOC working on placement   On 08/13/2023/08/14/2023 patient is complaining of severe pain in her right ear that is not controlled by tylenol. The pain has not improved with IV Unasyn. CT head demonstrated mastoiditis and otitis media with swelling of the external canal. The patient was given IV Fentanyl for pain and antibiotics were expanded to include cipro PO. She will undergo dialysis on Saturday as part of her usual schedule.  PT/OT recommended nursing home placement, however insurance company denied the application.  4/2: Remained medically stable.  Difficult disposition as daughter does not want her coming back to her home.  TOC is working on it.  4/3: Had her HD today.  Awaiting placement  4/5: HD today.  Still awaiting placement  4/6: Remains stable.  Had a fall yesterday evening with no injuries  4/8: Remained stable.  Had her dialysis today.   Assessment/Plan:   Principal Problem:   Nonspecific chest pain Active Problems:   Otitis media not resolved, right   Hypertensive urgency   ESRD on dialysis (HCC)   Right upper quadrant abdominal pain   Body mass index is 25.74 kg/m.   Right otitis media Resolved. Completed 7 days of antibiotics on 08/20/2023 (including IV Unasyn followed  by ciprofloxacin and Levaquin).    Hypertensive urgency Orthostatic hypotension. Fluctuating BP.  BP  significantly elevated.  Increase amlodipine again from 5 to 10 mg daily.   Continue amlodipine, labetalol, losartan, terazosin and hydralazine.    ESRD on dialysis (HCC) Hyperkalemia Hypocalcemia Anemia of chronic kidney disease Plan for hemodialysis today Continue Epogen, Cinacalcet, sevelamer and calcium carbonate Follow-up with nephrologist for hemodialysis  Chest pain Resolved   Right upper quadrant abdominal pain Improved.  Continue Protonix US of the RUQ of the abdomen is negative for cholecystitis or cholelithiasis.     General weakness No safe discharge plan.  Awaiting placement to long-term care facility Follow-up with transition of care team to assist with disposition  Analgesics as needed for shoulder osteoarthritis  Patient has been informed that insurance authorization for SNF was denied.  She said she has nowhere to go.  She is going to discuss this further with her daughter and come up with a plan.  Discussed with Deliliah, case Production designer, theatre/television/film.  She said patient may have to pay out-of-pocket to get to SNF.  Follow-up with TOC to assist with disposition.   Diet Order             Diet Heart Room service appropriate? Yes; Fluid consistency: Thin  Diet effective now                  Consultants: Nephrologist  Procedures: None   Medications:    amLODipine  10 mg Oral QPM   aspirin EC  81 mg Oral Daily  atorvastatin  40 mg Oral Daily   calcitRIOL  0.25 mcg Oral Q T,Th,Sa-HD   calcium carbonate  400 mg of elemental calcium Oral BID   Chlorhexidine Gluconate Cloth  6 each Topical Q0600   cinacalcet  30 mg Oral Q supper   epoetin alfa  10,000 Units Intravenous Q T,Th,Sa-HD   fluticasone  1 spray Each Nare Daily   gabapentin  200 mg Oral BID   heparin  5,000 Units Subcutaneous Q12H   hydrALAZINE  100 mg Oral TID   hydrOXYzine  25 mg Oral TID    irbesartan  300 mg Oral QPM   isosorbide mononitrate  120 mg Oral Daily   labetalol  600 mg Oral BID   pantoprazole  40 mg Oral Daily   polyethylene glycol  17 g Oral Daily   senna-docusate  2 tablet Oral BID   sevelamer carbonate  1,600 mg Oral TID with meals   terazosin  2 mg Oral BID   Continuous Infusions:   Anti-infectives (From admission, onward)    Start     Dose/Rate Route Frequency Ordered Stop   08/19/23 1800  levofloxacin (LEVAQUIN) tablet 250 mg        250 mg Oral Every evening 08/19/23 1504 08/20/23 1739   08/15/23 1800  ciprofloxacin (CIPRO) tablet 500 mg  Status:  Discontinued        500 mg Oral Every evening 08/14/23 0856 08/19/23 1502   08/14/23 0915  ciprofloxacin (CIPRO) tablet 500 mg        500 mg Oral  Once 08/14/23 0856 08/14/23 1001   08/14/23 0615  Ampicillin-Sulbactam (UNASYN) 3 g in sodium chloride 0.9 % 100 mL IVPB  Status:  Discontinued        3 g 200 mL/hr over 30 Minutes Intravenous Every 12 hours 08/14/23 0528 08/16/23 1427        Family Communication/Anticipated D/C date and plan/Code Status   DVT prophylaxis: heparin injection 5,000 Units Start: 08/09/23 1000     Code Status: Full Code  Family Communication: None Disposition Plan: Plan to discharge to SNF, likely to a LTC   Status is: Observation The patient will require care spanning > 2 midnights and should be moved to inpatient because: No safe discharge plan   Subjective:  Patient was having her routine dialysis today.  No new concern  Objective:    Vitals:   09/10/23 1130 09/10/23 1200 09/10/23 1224 09/10/23 1230  BP: (!) 162/93 (!) 164/99 (!) 150/92 (!) 167/105  Pulse: 87 81 83 79  Resp: 15 15 16  (!) 25  Temp:    97.8 F (36.6 C)  TempSrc:    Oral  SpO2: 100% 100% 100% 100%  Weight:    65.9 kg  Height:       No data found.   Intake/Output Summary (Last 24 hours) at 09/10/2023 1328 Last data filed at 09/10/2023 1230 Gross per 24 hour  Intake --  Output 1700 ml   Net -1700 ml    Filed Weights   09/08/23 1155 09/10/23 0921 09/10/23 1230  Weight: 65.7 kg 68 kg 65.9 kg    Exam: General.  Well-developed lady, in no acute distress. Pulmonary.  Lungs clear bilaterally, normal respiratory effort. CV.  Regular rate and rhythm, no JVD, rub or murmur. Abdomen.  Soft, nontender, nondistended, BS positive. CNS.  Alert and oriented .  No focal neurologic deficit. Extremities.  No edema, no cyanosis, pulses intact and symmetrical. Psychiatry.  Judgment and insight appears normal.  Data Reviewed:   I have personally reviewed following labs and imaging studies:  Labs: Labs show the following:   Basic Metabolic Panel: Recent Labs  Lab 09/05/23 1258 09/08/23 0806 09/10/23 0935  NA 140 138 137  K 5.1 5.0 4.7  CL 100 101 100  CO2 27 25 23   GLUCOSE 84 88 79  BUN 99* 107* 97*  CREATININE 7.68* 8.20* 7.50*  CALCIUM 8.5* 8.5* 8.6*  PHOS 5.1* 5.0* 4.8*   GFR Estimated Creatinine Clearance: 8 mL/min (A) (by C-G formula based on SCr of 7.5 mg/dL (H)). Liver Function Tests: Recent Labs  Lab 09/05/23 1258 09/08/23 0806 09/10/23 0935  ALBUMIN 3.6 3.5 3.7   No results for input(s): "LIPASE", "AMYLASE" in the last 168 hours. No results for input(s): "AMMONIA" in the last 168 hours. Coagulation profile No results for input(s): "INR", "PROTIME" in the last 168 hours.  CBC: Recent Labs  Lab 09/05/23 1258 09/08/23 0805 09/10/23 0935  WBC 4.8 4.9 4.4  HGB 8.2* 8.1* 8.6*  HCT 25.6* 25.4* 27.3*  MCV 87.1 84.9 87.2  PLT 215 201 211   Cardiac Enzymes: No results for input(s): "CKTOTAL", "CKMB", "CKMBINDEX", "TROPONINI" in the last 168 hours. BNP (last 3 results) No results for input(s): "PROBNP" in the last 8760 hours. CBG: No results for input(s): "GLUCAP" in the last 168 hours. D-Dimer: No results for input(s): "DDIMER" in the last 72 hours. Hgb A1c: No results for input(s): "HGBA1C" in the last 72 hours. Lipid Profile: No results  for input(s): "CHOL", "HDL", "LDLCALC", "TRIG", "CHOLHDL", "LDLDIRECT" in the last 72 hours. Thyroid function studies: No results for input(s): "TSH", "T4TOTAL", "T3FREE", "THYROIDAB" in the last 72 hours.  Invalid input(s): "FREET3" Anemia work up: No results for input(s): "VITAMINB12", "FOLATE", "FERRITIN", "TIBC", "IRON", "RETICCTPCT" in the last 72 hours. Sepsis Labs: Recent Labs  Lab 09/05/23 1258 09/08/23 0805 09/10/23 0935  WBC 4.8 4.9 4.4    Microbiology No results found for this or any previous visit (from the past 240 hours).  Procedures and diagnostic studies:  No results found.    LOS: 0 days   Arnetha Courser MD  [facility]   Pager on [email or web address]. If 7PM-7AM, please contact night-coverage at [email or web address]  09/10/2023, 1:28 PM

## 2023-09-10 NOTE — Progress Notes (Signed)
 Hemodialysis note  Received patient in dialysis recliner to unit. Alert and oriented.  Informed consent signed and in chart.  Tx duration: 2:37. Patient decided not to finish her HD today and signed the waiver. She had an episode of chest pain during tx but was resolved after 5 mins. Vital signs stable. HD NP was notified.   Transported back to room, alert without acute distress.  Report given to patient's RN.   Access used: Right Chest HD Catheter Access issues: none  Total UF removed: 1.7L Medication(s) given:  Reatcrit 10 000 units IV  Post HD weight: 65.9 kg   Wolfgang Phoenix [facility]

## 2023-09-10 NOTE — Progress Notes (Signed)
 [facility]  ROUNDING NOTE   Subjective:   Patient seen and evaluated during dialysis   HEMODIALYSIS FLOWSHEET:  Blood Flow Rate (mL/min): 399 mL/min Arterial Pressure (mmHg): -195.55 mmHg Venous Pressure (mmHg): 187.46 mmHg TMP (mmHg): 18.58 mmHg Ultrafiltration Rate (mL/min): 1109 mL/min Dialysate Flow Rate (mL/min): 299 ml/min Dialysis Fluid Bolus: Normal Saline  Tolerating treatment well seated in chair  Objective:  Vital signs in last 24 hours:  Temp:  [97.7 F (36.5 C)-98.5 F (36.9 C)] 97.7 F (36.5 C) (04/10 0921) Pulse Rate:  [78-88] 87 (04/10 1130) Resp:  [13-20] 15 (04/10 1130) BP: (132-176)/(83-113) 162/93 (04/10 1130) SpO2:  [99 %-100 %] 100 % (04/10 1130) Weight:  [68 kg] 68 kg (04/10 0921)  Weight change:  Filed Weights   09/08/23 0757 09/08/23 1155 09/10/23 0921  Weight: 65.3 kg 65.7 kg 68 kg    Intake/Output: I/O last 3 completed shifts: In: 120 [P.O.:120] Out: -    Intake/Output this shift:  No intake/output data recorded.  Physical Exam: General: NAD  Head: Normocephalic, atraumatic. Moist oral mucosal membranes  Eyes: Anicteric  Lungs:  Clear to auscultation  Heart: Regular rate and rhythm  Abdomen:  Soft, nontender  Extremities: No peripheral edema.  Neurologic: Alert, moving all four extremities  Skin: No lesions  Access: Rt Chest Permcath    Basic Metabolic Panel: Recent Labs  Lab 09/05/23 1258 09/08/23 0806 09/10/23 0935  NA 140 138 137  K 5.1 5.0 4.7  CL 100 101 100  CO2 27 25 23   GLUCOSE 84 88 79  BUN 99* 107* 97*  CREATININE 7.68* 8.20* 7.50*  CALCIUM 8.5* 8.5* 8.6*  PHOS 5.1* 5.0* 4.8*    Liver Function Tests: Recent Labs  Lab 09/05/23 1258 09/08/23 0806 09/10/23 0935  ALBUMIN 3.6 3.5 3.7    No results for input(s): "LIPASE", "AMYLASE" in the last 168 hours. No results for input(s): "AMMONIA" in the last 168 hours.  CBC: Recent Labs  Lab 09/05/23 1258 09/08/23 0805 09/10/23 0935  WBC  4.8 4.9 4.4  HGB 8.2* 8.1* 8.6*  HCT 25.6* 25.4* 27.3*  MCV 87.1 84.9 87.2  PLT 215 201 211    Cardiac Enzymes: No results for input(s): "CKTOTAL", "CKMB", "CKMBINDEX", "TROPONINI" in the last 168 hours.  BNP: Invalid input(s): "POCBNP"  CBG: No results for input(s): "GLUCAP" in the last 168 hours.  Microbiology: Results for orders placed or performed during the hospital encounter of 08/09/23  Resp panel by RT-PCR (RSV, Flu A&B, Covid) Anterior Nasal Swab     Status: None   Collection Time: 08/14/23  3:44 AM   Specimen: Anterior Nasal Swab  Result Value Ref Range Status   SARS Coronavirus 2 by RT PCR NEGATIVE NEGATIVE Final    Comment: (NOTE) SARS-CoV-2 target nucleic acids are NOT DETECTED.  The SARS-CoV-2 RNA is generally detectable in upper respiratory specimens during the acute phase of infection. The lowest concentration of SARS-CoV-2 viral copies this assay can detect is 138 copies/mL. A negative result does not preclude SARS-Cov-2 infection and should not be used as the sole basis for treatment or other patient management decisions. A negative result may occur with  improper specimen collection/handling, submission of specimen other than nasopharyngeal swab, presence of viral mutation(s) within the areas targeted by this assay, and inadequate number of viral copies(<138 copies/mL). A negative result must be combined with clinical observations, patient history, and epidemiological information. The expected result is Negative.  Fact Sheet for Patients:  BloggerCourse.com  Fact Sheet for  Healthcare Providers:  SeriousBroker.it  This test is no t yet approved or cleared by the [place] and  has been authorized for detection and/or diagnosis of SARS-CoV-2 by FDA under an Emergency Use Authorization (EUA). This EUA will remain  in effect (meaning this test can be used) for the duration of the COVID-19  declaration under Section 564(b)(1) of the Act, 21 U.S.C.section 360bbb-3(b)(1), unless the authorization is terminated  or revoked sooner.       Influenza A by PCR NEGATIVE NEGATIVE Final   Influenza B by PCR NEGATIVE NEGATIVE Final    Comment: (NOTE) The Xpert Xpress SARS-CoV-2/FLU/RSV plus assay is intended as an aid in the diagnosis of influenza from Nasopharyngeal swab specimens and should not be used as a sole basis for treatment. Nasal washings and aspirates are unacceptable for Xpert Xpress SARS-CoV-2/FLU/RSV testing.  Fact Sheet for Patients: BloggerCourse.com  Fact Sheet for Healthcare Providers: SeriousBroker.it  This test is not yet approved or cleared by the [place] FDA and has been authorized for detection and/or diagnosis of SARS-CoV-2 by FDA under an Emergency Use Authorization (EUA). This EUA will remain in effect (meaning this test can be used) for the duration of the COVID-19 declaration under Section 564(b)(1) of the Act, 21 U.S.C. section 360bbb-3(b)(1), unless the authorization is terminated or revoked.     Resp Syncytial Virus by PCR NEGATIVE NEGATIVE Final    Comment: (NOTE) Fact Sheet for Patients: BloggerCourse.com  Fact Sheet for Healthcare Providers: SeriousBroker.it  This test is not yet approved or cleared by the [place] FDA and has been authorized for detection and/or diagnosis of SARS-CoV-2 by FDA under an Emergency Use Authorization (EUA). This EUA will remain in effect (meaning this test can be used) for the duration of the COVID-19 declaration under Section 564(b)(1) of the Act, 21 U.S.C. section 360bbb-3(b)(1), unless the authorization is terminated or revoked.  Performed at [facility], [address] [place], [place] [address]   Respiratory (~20 pathogens) panel by PCR     Status: None   Collection Time:  08/14/23  3:44 AM   Specimen: Nasopharyngeal Swab; Respiratory  Result Value Ref Range Status   Adenovirus NOT DETECTED NOT DETECTED Final   Coronavirus 229E NOT DETECTED NOT DETECTED Final    Comment: (NOTE) The Coronavirus on the Respiratory Panel, DOES NOT test for the novel  Coronavirus (2019 nCoV)    Coronavirus HKU1 NOT DETECTED NOT DETECTED Final   Coronavirus NL63 NOT DETECTED NOT DETECTED Final   Coronavirus OC43 NOT DETECTED NOT DETECTED Final   Metapneumovirus NOT DETECTED NOT DETECTED Final   Rhinovirus / Enterovirus NOT DETECTED NOT DETECTED Final   Influenza A NOT DETECTED NOT DETECTED Final   Influenza B NOT DETECTED NOT DETECTED Final   Parainfluenza Virus 1 NOT DETECTED NOT DETECTED Final   Parainfluenza Virus 2 NOT DETECTED NOT DETECTED Final   Parainfluenza Virus 3 NOT DETECTED NOT DETECTED Final   Parainfluenza Virus 4 NOT DETECTED NOT DETECTED Final   Respiratory Syncytial Virus NOT DETECTED NOT DETECTED Final   Bordetella pertussis NOT DETECTED NOT DETECTED Final   Bordetella Parapertussis NOT DETECTED NOT DETECTED Final   Chlamydophila pneumoniae NOT DETECTED NOT DETECTED Final   Mycoplasma pneumoniae NOT DETECTED NOT DETECTED Final    Comment: Performed at [facility], [address], [place], [place] [address]  Culture, blood (Routine X 2) w Reflex to ID Panel     Status: None   Collection Time: 08/14/23  9:19 AM   Specimen: BLOOD  Result Value Ref Range Status   Specimen Description BLOOD BLOOD RIGHT HAND  Final   Special Requests   Final    BOTTLES DRAWN AEROBIC AND ANAEROBIC Blood Culture adequate volume   Culture   Final    NO GROWTH 5 DAYS Performed at [facility], [address] [place], [place] [address]    Report Status 08/19/2023 FINAL  Final  Culture, blood (Routine X 2) w Reflex to ID Panel     Status: None   Collection Time: 08/14/23  9:19 AM   Specimen: BLOOD  Result Value Ref Range Status   Specimen Description BLOOD  BLOOD LEFT HAND  Final   Special Requests   Final    BOTTLES DRAWN AEROBIC AND ANAEROBIC Blood Culture adequate volume   Culture   Final    NO GROWTH 5 DAYS Performed at [facility], [address] [place], [place] [address]    Report Status 08/19/2023 FINAL  Final  Ear culture     Status: None   Collection Time: 08/14/23 10:11 AM   Specimen: Ear; Other  Result Value Ref Range Status   Specimen Description   Final    EAR Performed at [facility], [address], [place], [place] [address]    Special Requests   Final    RIGHT EAR Performed at [facility], [address] [place], [place] [address]    Culture RARE STREPTOCOCCUS PNEUMONIAE  Final   Report Status 08/16/2023 FINAL  Final   Organism ID, Bacteria STREPTOCOCCUS PNEUMONIAE  Final      Susceptibility   Streptococcus pneumoniae - MIC*    ERYTHROMYCIN <=0.12 SENSITIVE Sensitive     LEVOFLOXACIN 1 SENSITIVE Sensitive     VANCOMYCIN 0.5 SENSITIVE Sensitive     PENICILLIN (meningitis) <=0.06 SENSITIVE Sensitive     PENO - penicillin <=0.06      PENICILLIN (non-meningitis) <=0.06 SENSITIVE Sensitive     PENICILLIN (oral) <=0.06 SENSITIVE Sensitive     CEFTRIAXONE (non-meningitis) <=0.12 SENSITIVE Sensitive     CEFTRIAXONE (meningitis) <=0.12 SENSITIVE Sensitive     * RARE STREPTOCOCCUS PNEUMONIAE    Coagulation Studies: No results for input(s): "LABPROT", "INR" in the last 72 hours.  Urinalysis: No results for input(s): "COLORURINE", "LABSPEC", "PHURINE", "GLUCOSEU", "HGBUR", "BILIRUBINUR", "KETONESUR", "PROTEINUR", "UROBILINOGEN", "NITRITE", "LEUKOCYTESUR" in the last 72 hours.  Invalid input(s): "APPERANCEUR"    Imaging: No results found.    Medications:     amLODipine  10 mg Oral QPM   aspirin EC  81 mg Oral Daily   atorvastatin  40 mg Oral Daily   calcitRIOL  0.25 mcg Oral Q T,Th,Sa-HD   calcium carbonate  400 mg of elemental calcium Oral BID   Chlorhexidine Gluconate  Cloth  6 each Topical Q0600   cinacalcet  30 mg Oral Q supper   epoetin alfa  10,000 Units Intravenous Q T,Th,Sa-HD   fluticasone  1 spray Each Nare Daily   gabapentin  200 mg Oral BID   heparin  5,000 Units Subcutaneous Q12H   hydrALAZINE  100 mg Oral TID   hydrOXYzine  25 mg Oral TID   irbesartan  300 mg Oral QPM   isosorbide mononitrate  120 mg Oral Daily   labetalol  600 mg Oral BID   pantoprazole  40 mg Oral Daily   polyethylene glycol  17 g Oral Daily   senna-docusate  2 tablet Oral BID   sevelamer carbonate  1,600  mg Oral TID with meals   terazosin  2 mg Oral BID   alteplase, guaiFENesin-dextromethorphan, heparin, ondansetron (ZOFRAN) IV, oxyCODONE  Assessment/ Plan:  [name] is a 53 y.o.  female  with past medical conditions including hypertension, CHF, anemia, and end-stage renal disease on hemodialysis, who was admitted to [facility] on 08/09/2023 for Hypertensive urgency [I16.0] Chest pain [R07.9] Nonspecific chest pain [R07.9] Anemia due to chronic kidney disease, on chronic dialysis (HCC) [N18.6, D63.1, Z99.2]  Outpatient dialysis clinic was arranged at [facility] on a TTS schedule, chair time at 530 am.  Due to extended admission, outpatient assignment will need to be confirmed and possibly resubmitted prior to discharge.   1.  Hypertensive urgency, on admission   Blood pressure slightly elevated during dialysis, 168/106.  Currently prescribed amlodipine, hydralazine, irbesartan, isosorbide, and labetalol.  2. Anemia of chronic kidney disease Hemoglobin & Hematocrit     Component Value Date/Time   HGB 8.6 (L) 09/10/2023 0935   HCT 27.3 (L) 09/10/2023 0935  Hemoglobin 8.6, below optimal range. Will continue IV EPO with  dialysis treatment.   3.  End stage renal disease on hemodialysis.  Patient receiving dialysis today, UF goal 2.5 L as tolerated.  Next treatment scheduled for Saturday.   4. Secondary Hyperparathyroidism:   Lab Results  Component  Value Date   PTH 1,248 (H) 08/11/2023   CALCIUM 8.6 (L) 09/10/2023   PHOS 4.8 (H) 09/10/2023   Currently prescribed calcium carbonate, sevelamer and Cinacalcet. Continue calcitriol as prescribed.    LOS: 0 Avant Printy 4/10/202512:13 PM

## 2023-09-10 NOTE — Plan of Care (Signed)
  Problem: Education: Goal: Understanding of cardiac disease, CV risk reduction, and recovery process will improve Outcome: Progressing   Problem: Activity: Goal: Ability to tolerate increased activity will improve Outcome: Progressing   Problem: Cardiac: Goal: Ability to achieve and maintain adequate cardiovascular perfusion will improve Outcome: Progressing   Problem: Health Behavior/Discharge Planning: Goal: Ability to safely manage health-related needs after discharge will improve Outcome: Progressing   Problem: Education: Goal: Knowledge of General Education information will improve Description: Including pain rating scale, medication(s)/side effects and non-pharmacologic comfort measures Outcome: Progressing   Problem: Health Behavior/Discharge Planning: Goal: Ability to manage health-related needs will improve Outcome: Progressing   Problem: Clinical Measurements: Goal: Ability to maintain clinical measurements within normal limits will improve Outcome: Progressing Goal: Will remain free from infection Outcome: Progressing Goal: Diagnostic test results will improve Outcome: Progressing Goal: Respiratory complications will improve Outcome: Progressing Goal: Cardiovascular complication will be avoided Outcome: Progressing   Problem: Activity: Goal: Risk for activity intolerance will decrease Outcome: Progressing   Problem: Nutrition: Goal: Adequate nutrition will be maintained Outcome: Progressing   Problem: Coping: Goal: Level of anxiety will decrease Outcome: Progressing   Problem: Elimination: Goal: Will not experience complications related to bowel motility Outcome: Progressing Goal: Will not experience complications related to urinary retention Outcome: Progressing   Problem: Pain Managment: Goal: General experience of comfort will improve and/or be controlled Outcome: Progressing   Problem: Safety: Goal: Ability to remain free from injury will  improve Outcome: Progressing   Problem: Skin Integrity: Goal: Risk for impaired skin integrity will decrease Outcome: Progressing

## 2023-09-11 ENCOUNTER — Observation Stay

## 2023-09-11 DIAGNOSIS — I160 Hypertensive urgency: Secondary | ICD-10-CM | POA: Diagnosis not present

## 2023-09-11 DIAGNOSIS — I132 Hypertensive heart and chronic kidney disease with heart failure and with stage 5 chronic kidney disease, or end stage renal disease: Secondary | ICD-10-CM | POA: Diagnosis not present

## 2023-09-11 DIAGNOSIS — F446 Conversion disorder with sensory symptom or deficit: Secondary | ICD-10-CM | POA: Diagnosis not present

## 2023-09-11 DIAGNOSIS — N186 End stage renal disease: Secondary | ICD-10-CM | POA: Diagnosis not present

## 2023-09-11 DIAGNOSIS — R079 Chest pain, unspecified: Secondary | ICD-10-CM | POA: Diagnosis not present

## 2023-09-11 DIAGNOSIS — H6691 Otitis media, unspecified, right ear: Secondary | ICD-10-CM | POA: Diagnosis not present

## 2023-09-11 LAB — GLUCOSE, CAPILLARY
Glucose-Capillary: 77 mg/dL (ref 70–99)
Glucose-Capillary: 107 mg/dL — ABNORMAL HIGH (ref 70–99)

## 2023-09-11 MED ORDER — STROKE: EARLY STAGES OF RECOVERY BOOK: Freq: Once | Status: AC

## 2023-09-11 NOTE — Progress Notes (Signed)
 This note has been entered in error.  I started seeing this patient by the end Dr. Selina Cooley to go over this code stroke and will do a separate note.

## 2023-09-11 NOTE — Progress Notes (Signed)
 Progress Note    Allison Hill  ZOX:[id number] DOB: 1970/06/24  DOA: 08/09/2023 PCP: Melvenia Beam, MD      Brief Narrative:    Medical records reviewed and are as summarized below:  Allison Hill is a 53 y.o. female with medical history significant of ESRD on HD MWF, refractory HTN, chronic HFpEF, chronic iron deficiency anemia admitted to hospitalist service for evaluation of chest pain, hypertensive urgency.  Patient's chest pain atypical, troponins negative.  Blood pressure improved with home regimen.  Patient lives with her daughter who does not want to take her home given high risk for falls, not safe for her HD transportation.  HD outpatient facility arranged.  TOC working on placement   On 08/13/2023/08/14/2023 patient is complaining of severe pain in her right ear that is not controlled by tylenol. The pain has not improved with IV Unasyn. CT head demonstrated mastoiditis and otitis media with swelling of the external canal. The patient was given IV Fentanyl for pain and antibiotics were expanded to include cipro PO. She will undergo dialysis on Saturday as part of her usual schedule.  PT/OT recommended nursing home placement, however insurance company denied the application.  4/2: Remained medically stable.  Difficult disposition as daughter does not want her coming back to her home.  TOC is working on it.  4/3: Had her HD today.  Awaiting placement  4/5: HD today.  Still awaiting placement  4/6: Remains stable.  Had a fall yesterday evening with no injuries  4/8: Remained stable.  Had her dialysis today.  4/11: Code stroke was called when patient was complaining of generalized weakness and tingling.  Exam pretty much reassuring looks like some functional left-sided weakness.  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  Neurology evaluated her.  Initial CT head was negative.  Patient was eating a lot of salty foods her blood  pressure was elevated.  Neurologist is recommending MRI and if negative no further workup needed.  Patient should counseled again regarding dietary restrictions.  She was doing a lot of [employer] as does not like hospital food.   Assessment/Plan:   Principal Problem:   Nonspecific chest pain Active Problems:   Otitis media not resolved, right   Hypertensive urgency   ESRD on dialysis (HCC)   Right upper quadrant abdominal pain   Body mass index is 25.74 kg/m.   Right otitis media Resolved. Completed 7 days of antibiotics on 08/20/2023 (including IV Unasyn followed by ciprofloxacin and Levaquin).    Hypertensive urgency Orthostatic hypotension. Fluctuating BP.  BP  significantly elevated.  Increase amlodipine again from 5 to 10 mg daily.   Continue amlodipine, labetalol, losartan, terazosin and hydralazine. Encourage salt restricted diet.    ESRD on dialysis (HCC) Hyperkalemia Hypocalcemia Anemia of chronic kidney disease Plan for hemodialysis today Continue Epogen, Cinacalcet, sevelamer and calcium carbonate Follow-up with nephrologist for hemodialysis  Chest pain Resolved   Right upper quadrant abdominal pain Improved.  Continue Protonix US of the RUQ of the abdomen is negative for cholecystitis or cholelithiasis.     General weakness No safe discharge plan.  Awaiting placement to long-term care facility Follow-up with transition of care team to assist with disposition  Analgesics as needed for shoulder osteoarthritis  Patient has been informed that insurance authorization for SNF was denied.  She said she has nowhere to go.  She is going to discuss this further with her daughter and come up with a plan.  Discussed with Deliliah, case Production designer, theatre/television/film.  She said patient may have to pay out-of-pocket to get to SNF.  Follow-up with TOC to assist with disposition.   Diet Order             Diet NPO time specified  Diet effective now                   Consultants: Nephrologist  Procedures: None   Medications:    [START ON 09/12/2023]  stroke: early stages of recovery book   Does not apply Once   amLODipine  10 mg Oral QPM   aspirin EC  81 mg Oral Daily   atorvastatin  40 mg Oral Daily   calcitRIOL  0.25 mcg Oral Q T,Th,Sa-HD   calcium carbonate  400 mg of elemental calcium Oral BID   Chlorhexidine Gluconate Cloth  6 each Topical Q0600   cinacalcet  30 mg Oral Q supper   epoetin alfa  10,000 Units Intravenous Q T,Th,Sa-HD   fluticasone  1 spray Each Nare Daily   gabapentin  200 mg Oral BID   heparin  5,000 Units Subcutaneous Q12H   hydrALAZINE  100 mg Oral TID   hydrOXYzine  25 mg Oral TID   irbesartan  300 mg Oral QPM   isosorbide mononitrate  120 mg Oral Daily   labetalol  600 mg Oral BID   pantoprazole  40 mg Oral Daily   polyethylene glycol  17 g Oral Daily   senna-docusate  2 tablet Oral BID   sevelamer carbonate  1,600 mg Oral TID with meals   terazosin  2 mg Oral BID   Continuous Infusions:   Anti-infectives (From admission, onward)    Start     Dose/Rate Route Frequency Ordered Stop   08/19/23 1800  levofloxacin (LEVAQUIN) tablet 250 mg        250 mg Oral Every evening 08/19/23 1504 08/20/23 1739   08/15/23 1800  ciprofloxacin (CIPRO) tablet 500 mg  Status:  Discontinued        500 mg Oral Every evening 08/14/23 0856 08/19/23 1502   08/14/23 0915  ciprofloxacin (CIPRO) tablet 500 mg        500 mg Oral  Once 08/14/23 0856 08/14/23 1001   08/14/23 0615  Ampicillin-Sulbactam (UNASYN) 3 g in sodium chloride 0.9 % 100 mL IVPB  Status:  Discontinued        3 g 200 mL/hr over 30 Minutes Intravenous Every 12 hours 08/14/23 0528 08/16/23 1427        Family Communication/Anticipated D/C date and plan/Code Status   DVT prophylaxis: heparin injection 5,000 Units Start: 08/09/23 1000     Code Status: Full Code  Family Communication: None Disposition Plan: Plan to discharge to SNF, likely to a  LTC   Status is: Observation The patient will require care spanning > 2 midnights and should be moved to inpatient because: No safe discharge plan   Subjective:  Patient was seen twice today.  Initially no complaint and she was ordering food through [employer].  Later code stroke was called when she was complaining of generalized tingling and numbness.  Objective:    Vitals:   09/10/23 2011 09/10/23 2154 09/11/23 0321 09/11/23 0743  BP: 134/81 134/81 (!) 149/94 (!) 167/96  Pulse: 96 96 80 97  Resp: 16  17 16   Temp: 97.7 F (36.5 C)  97.6 F (36.4 C) 97.7 F (36.5 C)  TempSrc:    Oral  SpO2: 97%  99%  100%  Weight:      Height:       No data found.  No intake or output data in the 24 hours ending 09/11/23 1551   Filed Weights   09/08/23 1155 09/10/23 0921 09/10/23 1230  Weight: 65.7 kg 68 kg 65.9 kg    Exam: General.  Well-developed lady, in no acute distress. Pulmonary.  Lungs clear bilaterally, normal respiratory effort. CV.  Regular rate and rhythm, no JVD, rub or murmur. Abdomen.  Soft, nontender, nondistended, BS positive. CNS.  Alert and oriented .  No focal neurologic deficit.  Seems like functional worsening of left-sided weakness. Extremities.  No edema, no cyanosis, pulses intact and symmetrical.  Data Reviewed:   I have personally reviewed following labs and imaging studies:  Labs: Labs show the following:   Basic Metabolic Panel: Recent Labs  Lab 09/05/23 1258 09/08/23 0806 09/10/23 0935  NA 140 138 137  K 5.1 5.0 4.7  CL 100 101 100  CO2 27 25 23   GLUCOSE 84 88 79  BUN 99* 107* 97*  CREATININE 7.68* 8.20* 7.50*  CALCIUM 8.5* 8.5* 8.6*  PHOS 5.1* 5.0* 4.8*   GFR Estimated Creatinine Clearance: 8 mL/min (A) (by C-G formula based on SCr of 7.5 mg/dL (H)). Liver Function Tests: Recent Labs  Lab 09/05/23 1258 09/08/23 0806 09/10/23 0935  ALBUMIN 3.6 3.5 3.7   No results for input(s): "LIPASE", "AMYLASE" in the last 168 hours. No  results for input(s): "AMMONIA" in the last 168 hours. Coagulation profile No results for input(s): "INR", "PROTIME" in the last 168 hours.  CBC: Recent Labs  Lab 09/05/23 1258 09/08/23 0805 09/10/23 0935  WBC 4.8 4.9 4.4  HGB 8.2* 8.1* 8.6*  HCT 25.6* 25.4* 27.3*  MCV 87.1 84.9 87.2  PLT 215 201 211   Cardiac Enzymes: No results for input(s): "CKTOTAL", "CKMB", "CKMBINDEX", "TROPONINI" in the last 168 hours. BNP (last 3 results) No results for input(s): "PROBNP" in the last 8760 hours. CBG: Recent Labs  Lab 09/11/23 0736 09/11/23 1438  GLUCAP 77 107*   D-Dimer: No results for input(s): "DDIMER" in the last 72 hours. Hgb A1c: No results for input(s): "HGBA1C" in the last 72 hours. Lipid Profile: No results for input(s): "CHOL", "HDL", "LDLCALC", "TRIG", "CHOLHDL", "LDLDIRECT" in the last 72 hours. Thyroid function studies: No results for input(s): "TSH", "T4TOTAL", "T3FREE", "THYROIDAB" in the last 72 hours.  Invalid input(s): "FREET3" Anemia work up: No results for input(s): "VITAMINB12", "FOLATE", "FERRITIN", "TIBC", "IRON", "RETICCTPCT" in the last 72 hours. Sepsis Labs: Recent Labs  Lab 09/05/23 1258 09/08/23 0805 09/10/23 0935  WBC 4.8 4.9 4.4    Microbiology No results found for this or any previous visit (from the past 240 hours).  Procedures and diagnostic studies:  CT HEAD CODE STROKE WO CONTRAST` Result Date: 09/11/2023 CLINICAL DATA:  Code stroke. Provided history: Altered mental status. EXAM: CT HEAD WITHOUT CONTRAST TECHNIQUE: Contiguous axial images were obtained from the base of the skull through the vertex without intravenous contrast. RADIATION DOSE REDUCTION: This exam was performed according to the departmental dose-optimization program which includes automated exposure control, adjustment of the mA and/or kV according to patient size and/or use of iterative reconstruction technique. COMPARISON:  Head CT 08/14/2023.  Brain MRI 10/06/2022.  FINDINGS: Brain: Generalized parenchymal atrophy. Small chronic cortically-based infarcts again demonstrated within the right temporo-occipital and left parietal lobes. Chronic lacunar infarcts within the bilateral cerebral hemispheric white matter and within/about the bilateral thalami, unchanged. Background advanced patchy and ill-defined  hypoattenuation within the cerebral white matter, nonspecific but compatible with chronic small vessel ischemic disease. There is no acute intracranial hemorrhage. No acute demarcated cortical infarct. No extra-axial fluid collection. No evidence of an intracranial mass. No midline shift. Vascular: No hyperdense vessel. Atherosclerotic calcifications. Skull: No calvarial fracture or aggressive osseous lesion. Sinuses/Orbits: No orbital mass or acute orbital finding. No significant paranasal sinus disease at the imaged levels. Other: Complete opacification of the mastoid air cells and partial opacification of the middle ear cavity on the right, progressed since the head CT of 08/14/2023. ASPECTS [facility] Stroke Program Early CT Score) - Ganglionic level infarction (caudate, lentiform nuclei, internal capsule, insula, M1-M3 cortex): 7 - Supraganglionic infarction (M4-M6 cortex): 3 Total score (0-10 with 10 being normal): 10 (when discounting chronic infarcts). No evidence of an acute intracranial abnormality. These results were communicated to Dr. Pearlean Brownie at 3:22 pmon 4/11/2025by text page via the [facility] messaging system. IMPRESSION: 1.  No evidence of an acute intracranial abnormality. 2. Parenchymal atrophy, advanced chronic small vessel ischemic disease and chronic infarcts, as described. 3. Complete opacification of the mastoid air cells and partial opacification of the middle ear cavity on the right, progressed since the head CT of 08/14/2023. Electronically Signed   By: Jackey Loge D.O.   On: 09/11/2023 15:23      LOS: 0 days   Arnetha Courser MD  [facility]    Pager on [email or web address]. If 7PM-7AM, please contact night-coverage at [email or web address]  09/11/2023, 3:51 PM

## 2023-09-11 NOTE — Significant Event (Signed)
 Paged S. Amin and secure chatted. Code stroke had been called on patient. MD present in pt room.

## 2023-09-11 NOTE — Significant Event (Signed)
 Pt to CT

## 2023-09-11 NOTE — Plan of Care (Signed)
  Problem: Education: Goal: Understanding of cardiac disease, CV risk reduction, and recovery process will improve Outcome: Progressing   Problem: Activity: Goal: Ability to tolerate increased activity will improve Outcome: Progressing   Problem: Cardiac: Goal: Ability to achieve and maintain adequate cardiovascular perfusion will improve Outcome: Progressing   Problem: Activity: Goal: Risk for activity intolerance will decrease Outcome: Progressing   Problem: Nutrition: Goal: Adequate nutrition will be maintained Outcome: Progressing

## 2023-09-11 NOTE — Progress Notes (Signed)
 Code Stroke Called 14:34.  MD in room upon arrival. Cardiac monitor applied. Patient taken to CT by this nurse. Julianne Handler and Dr. Selina Cooley neuro present in CT.  Patient taken back to room 143 with plans for q4 neuro checks and to be moved to stroke floor when bed is ready.

## 2023-09-11 NOTE — Code Documentation (Signed)
 Stroke Response Nurse Documentation Code Documentation  Allison Hill is a 53 y.o. female admitted to [facility] on 08/09/2023 for chest pain and hypertensive urgency with past medical hx of ESRD, HTN, HF, and prev stroke with left sided deficits, per pt and staff. On aspirin 81 mg daily. Code stroke was activated by staff .   Patient on 1A unit where she was LKW at 2100 the night prior (4/10) and now complaining of left sided numbness. Patient was eating lunch and around 1430 she called out to staff stating she couldn't move her left arm, had blurry vision, and numbness on the left side worse than normal. Patient reports going to bed feeling at baseline last night but woke up with new numbness on left side that got worse to the point of not moving her arm. Reports not notifying staff at the time she noticed the initial change.   Stroke team at the bedside after patient activation. Patient to CT with team. NIHSS 7, see documentation for details and code stroke times. Patient with left facial droop, left arm weakness, bilateral leg weakness, left decreased sensation, and dysarthria  on exam. The following imaging was completed:  CT Head. Patient is not a candidate for IV Thrombolytic due to outside window, per MD. Patient is not a candidate for IR due to exam not consistent with LVO, per MD.   Care/Plan:every 4 hour NIHSS and vital signs. Repeat swallow screen per protocol.   Process Delays Noted: none  Bedside handoff with RN Allison Hill.    Allison Hill  Stroke Response RN

## 2023-09-11 NOTE — Progress Notes (Signed)
 CODE STROKE- PHARMACY COMMUNICATION   Time CODE STROKE called/page received: 1430  Time response to CODE STROKE was made (in person or via phone): 1435, in person  Time Stroke Kit retrieved from Pyxis (only if needed): No thrombolytic per neurologist  Name of Provider/Nurse contacted: Remote / tele-neurologist in addition to Dr. Doretta Gant  Past Medical History:  Diagnosis Date   CHF (congestive heart failure) (HCC)    Hypertension    Renal disorder    Prior to Admission medications   Medication Sig Start Date End Date Taking? Authorizing Provider  amLODipine (NORVASC) 10 MG tablet Take 10 mg by mouth daily.   Yes [provider]  aspirin EC 81 MG tablet Take by mouth.   Yes [provider]  atorvastatin (LIPITOR) 40 MG tablet Take 40 mg by mouth daily. 07/05/23  Yes [provider]  gabapentin (NEURONTIN) 100 MG capsule Take 200 mg by mouth 2 (two) times daily.   Yes [provider]  hydrALAZINE (APRESOLINE) 100 MG tablet Take 100 mg by mouth 3 (three) times daily.   Yes [provider]  hydrOXYzine (ATARAX) 25 MG tablet Take 25 mg by mouth 3 (three) times daily. 06/25/23  Yes [provider]  isosorbide mononitrate (IMDUR) 120 MG 24 hr tablet Take 120 mg by mouth daily.   Yes [provider]  isosorbide mononitrate (IMDUR) 30 MG 24 hr tablet Take 30 mg by mouth daily. 07/05/23  Yes [provider]  labetalol (NORMODYNE) 200 MG tablet Take 400 mg by mouth 3 (three) times daily.   Yes [provider]  losartan (COZAAR) 100 MG tablet Take 100 mg by mouth daily.   Yes [provider]  nitroGLYCERIN (NITROSTAT) 0.4 MG SL tablet Place under the tongue. 05/29/23  Yes [provider]  polyethylene glycol powder (GLYCOLAX/MIRALAX) 17 GM/SCOOP powder Take by mouth. 03/09/23  Yes [provider]  senna (SENNA-TIME) 8.6 MG tablet Take by mouth. 03/09/23  Yes [provider]  sevelamer  carbonate (RENVELA) 800 MG tablet Take 1,600 mg by mouth 3 (three) times daily.   Yes [provider]  terazosin (HYTRIN) 2 MG capsule Take 2 mg by mouth 2 (two) times daily.   Yes [provider]  Cholecalciferol (VITAMIN D-1000 MAX ST) 25 MCG (1000 UT) tablet Take by mouth.    [provider]    Madelynn Schilder ,PharmD Clinical Pharmacist  09/11/2023  2:47 PM

## 2023-09-11 NOTE — Consult Note (Signed)
 NEUROLOGY CONSULT NOTE   Date of service: September 11, 2023 Patient Name: Allison Hill MRN:  [id number] DOB:  [birth year] Chief Complaint: stroke code for L sided numbness Requesting Provider: Arnetha Courser, MD  History of Present Illness   This is a 53 yo woman with hx ESRD on HD MWF, refractory HTN, chronic HFpEF, chronic iron deficiency anemia admitted to hospitalist service for evaluation of chest pain in the setting of hypertensive urgency. BP improved with reinstatement of home antihypertensive regimen and she is now awaiting placement. Stroke code was called for worsening L sided numbness (patient has some L sided numbness at baseline from prior stroke). Patient reports numbness present upon awakening this AM above her usual baseline; LKW was when she went to bed last night. Patient had a highly functional exam with giveway weakness in all extremities esp on L, splits the midline on vibration sensory testing over the forehead, and inconsistent answers to the examiner. Head CT showed no acute process. NIHSS = 7 see below. TNK not administered 2/2 presentation outside the window. CTA not performed bc exam was not c/w LVO. MRI brain performed after the stroke showed no acute infarct (d/w radiologist by phone, we are in agreement that punctate hyperintensity in L thalamus on FLAIR is artifactual).  LKW: 2100 last night Modified rankin score: 3-Moderate disability-requires help but walks WITHOUT assistance IV Thrombolysis: No outside window  NIHSS components Score: Comment  1a Level of Conscious 0[x]  1[]  2[]  3[]      1b LOC Questions 0[x]  1[]  2[]       1c LOC Commands 0[x]  1[]  2[]       2 Best Gaze 0[x]  1[]  2[]       3 Visual 0[x]  1[]  2[]  3[]      4 Facial Palsy 0[]  1[x]  2[]  3[]      5a Motor Arm - left 0[]  1[x]  2[]  3[]  4[]  UN[]    5b Motor Arm - Right 0[x]  1[]  2[]  3[]  4[]  UN[]    6a Motor Leg - Left 0[]  1[x]  2[]  3[]  4[]  UN[]    6b Motor Leg - Right 0[]  1[x]  2[]  3[]  4[]  UN[]    7 Limb Ataxia 0[x]   1[]  2[]  3[]  UN[]     8 Sensory 0[]  1[x]  2[]  UN[]      9 Best Language 0[x]  1[]  2[]  3[]      10 Dysarthria 0[]  1[x]  2[]  UN[]      11 Extinct. and Inattention 0[x]  1[]  2[]       TOTAL:  6   Any unchecked boxes represent a score of 0   ROS  Comprehensive ROS performed and pertinent positives documented in HPI   Past History   Past Medical History:  Diagnosis Date   CHF (congestive heart failure) (HCC)    Hypertension    Renal disorder     History reviewed. No pertinent surgical history.  Family History: History reviewed. No pertinent family history.  Social History  reports that she has been smoking cigarettes. She has never used smokeless tobacco. No history on file for alcohol use and drug use.  Allergies  Allergen Reactions   Ceftriaxone Shortness Of Breath   Shellfish Allergy Hives   Minoxidil Swelling    Medications   Current Facility-Administered Medications:    [START ON 09/12/2023]  stroke: early stages of recovery book, , Does not apply, Once, Jefferson Fuel, MD   amLODipine (NORVASC) tablet 10 mg, 10 mg, Oral, QPM, Lurene Shadow, MD, 10 mg at 09/11/23 1854   aspirin EC tablet 81 mg, 81 mg, Oral, Daily, Lurene Shadow,  MD, 81 mg at 09/11/23 0901   atorvastatin (LIPITOR) tablet 40 mg, 40 mg, Oral, Daily, Lurene Shadow, MD, 40 mg at 09/11/23 0901   calcitRIOL (ROCALTROL) capsule 0.25 mcg, 0.25 mcg, Oral, Q T,Th,Sa-HD, Thedore Mins, Harmeet, MD, 0.25 mcg at 09/10/23 1358   calcium carbonate (TUMS - dosed in mg elemental calcium) chewable tablet 400 mg of elemental calcium, 400 mg of elemental calcium, Oral, BID, Lurene Shadow, MD, 400 mg of elemental calcium at 09/11/23 0902   Chlorhexidine Gluconate Cloth 2 % PADS 6 each, 6 each, Topical, Q0600, Lurene Shadow, MD, 6 each at 09/11/23 0641   cinacalcet (SENSIPAR) tablet 30 mg, 30 mg, Oral, Q supper, Lurene Shadow, MD, 30 mg at 09/11/23 1855   epoetin alfa (EPOGEN) injection 10,000 Units, 10,000 Units, Intravenous, Q  T,Th,Sa-HD, Lurene Shadow, MD, 10,000 Units at 09/10/23 1155   fluticasone (FLONASE) 50 MCG/ACT nasal spray 1 spray, 1 spray, Each Nare, Daily, Lurene Shadow, MD, 1 spray at 09/11/23 0902   gabapentin (NEURONTIN) capsule 200 mg, 200 mg, Oral, BID, Lurene Shadow, MD, 200 mg at 09/11/23 0901   guaiFENesin-dextromethorphan (ROBITUSSIN DM) 100-10 MG/5ML syrup 15 mL, 15 mL, Oral, Q4H PRN, Lurene Shadow, MD   heparin injection 5,000 Units, 5,000 Units, Subcutaneous, Q12H, Lurene Shadow, MD, 5,000 Units at 09/10/23 2153   hydrALAZINE (APRESOLINE) tablet 100 mg, 100 mg, Oral, TID, Lurene Shadow, MD, 100 mg at 09/11/23 1553   hydrOXYzine (ATARAX) tablet 25 mg, 25 mg, Oral, TID, Lurene Shadow, MD, 25 mg at 09/11/23 1554   irbesartan (AVAPRO) tablet 300 mg, 300 mg, Oral, QPM, Singh, Harmeet, MD, 300 mg at 09/11/23 1853   isosorbide mononitrate (IMDUR) 24 hr tablet 120 mg, 120 mg, Oral, Daily, Lurene Shadow, MD, 120 mg at 09/11/23 0901   labetalol (NORMODYNE) tablet 600 mg, 600 mg, Oral, BID, Lurene Shadow, MD, 600 mg at 09/11/23 0903   ondansetron (ZOFRAN) injection 4 mg, 4 mg, Intravenous, Q6H PRN, Lurene Shadow, MD   oxyCODONE (Oxy IR/ROXICODONE) immediate release tablet 5 mg, 5 mg, Oral, Q6H PRN, Lurene Shadow, MD, 5 mg at 09/11/23 1854   pantoprazole (PROTONIX) EC tablet 40 mg, 40 mg, Oral, Daily, Lurene Shadow, MD, 40 mg at 09/11/23 0901   polyethylene glycol (MIRALAX / GLYCOLAX) packet 17 g, 17 g, Oral, Daily, Arnetha Courser, MD, 17 g at 09/10/23 1400   senna-docusate (Senokot-S) tablet 2 tablet, 2 tablet, Oral, BID, Lurene Shadow, MD, 2 tablet at 09/11/23 0901   sevelamer carbonate (RENVELA) tablet 1,600 mg, 1,600 mg, Oral, TID with meals, Lurene Shadow, MD, 1,600 mg at 09/11/23 1853   terazosin (HYTRIN) capsule 2 mg, 2 mg, Oral, BID, Lurene Shadow, MD, 2 mg at 09/11/23 0904  Vitals   Vitals:   09/10/23 2154 09/11/23 0321 09/11/23 0743 09/11/23 1800  BP: 134/81 (!) 149/94 (!) 167/96  (!) 165/97  Pulse: 96 80 97 89  Resp:  17 16 15   Temp:  97.6 F (36.4 C) 97.7 F (36.5 C) 98.6 F (37 C)  TempSrc:   Oral Oral  SpO2:  99% 100% 99%  Weight:      Height:        Body mass index is 25.74 kg/m.  Physical Exam   Gen: patient lying in bed, NAD CV: extremities appear well-perfused Resp: normal WOB  Neurologic Examination   MS: alert, oriented x4, follows commands Speech: no dysarthria, no aphasia CN: PERRL, VFF, EOMI, sensation impaired on L side, splits midline on vibration to forehead, L NLF flattening, hearing intact  to voice Motor: Initially cannot lift LUE off bed but when examiner holds arm up for her and releases she is able to hold it up with minimal drift. RUE no drift. BLE drift but not to bed. Sensory: impaired LT L side throughout Reflexes: 1+ symm with toes down bilat Coordination: no ataxia Gait: deferred  Labs/Imaging/Neurodiagnostic studies   CBC:  Recent Labs  Lab Oct 01, 2023 0805 09/10/23 0935  WBC 4.9 4.4  HGB 8.1* 8.6*  HCT 25.4* 27.3*  MCV 84.9 87.2  PLT 201 211   Basic Metabolic Panel:  Lab Results  Component Value Date   NA 137 09/10/2023   K 4.7 09/10/2023   CO2 23 09/10/2023   GLUCOSE 79 09/10/2023   BUN 97 (H) 09/10/2023   CREATININE 7.50 (H) 09/10/2023   CALCIUM 8.6 (L) 09/10/2023   GFRNONAA 6 (L) 09/10/2023   GFRAA  05/17/2007    >60        The eGFR has been calculated using the MDRD equation. This calculation has not been validated in all clinical   Lipid Panel: No results found for: "LDLCALC" HgbA1c: No results found for: "HGBA1C" Urine Drug Screen:     Component Value Date/Time   LABOPIA NONE DETECTED 08/09/2023 0820   COCAINSCRNUR POSITIVE (A) 08/09/2023 0820   LABBENZ NONE DETECTED 08/09/2023 0820   AMPHETMU NONE DETECTED 08/09/2023 0820   THCU NONE DETECTED 08/09/2023 0820   LABBARB NONE DETECTED 08/09/2023 0820    Alcohol Level No results found for: "ETH" INR  Lab Results  Component Value Date    INR 0.8 05/25/2007   APTT  Lab Results  Component Value Date   APTT 29 05/25/2007   AED levels: No results found for: "PHENYTOIN", "ZONISAMIDE", "LAMOTRIGINE", "LEVETIRACETA"  CT Head without contrast(Personally reviewed): No acute process   MRI Brain(Personally reviewed): No acute infarct, d/w radiology, we are both in agreement punctate hyperintensity in R thalamus is favored to be artifactual.   ASSESSMENT   This is a 53 yo woman with hx ESRD on HD MWF, refractory HTN, chronic HFpEF, chronic iron deficiency anemia admitted to hospitalist service for evaluation of chest pain in the setting of hypertensive urgency. BP improved with reinstatement of home antihypertensive regimen and she is now awaiting placement. Stroke code was called for worsening of her baseline L sided numbness. LKW last night. Exam was very effort-dependent with giveway weakness and functional features such as splitting the midline on vibration to forehead. Suspicion for ischemic infarct is low. MRI brain showed no evidence of acute infarct. Favor functional neurologic symptom disorder/conversion, no further inpatient stroke workup recommended.  RECOMMENDATIONS   - No further workup indicated - Neurology to sign off ______________________________________________________________________    Signed, Jefferson Fuel, MD [facility]

## 2023-09-11 NOTE — Progress Notes (Signed)
   09/11/23 1400  Spiritual Encounters  Type of Visit Initial  Care provided to: Patient  Referral source Code page  Reason for visit Code  OnCall Visit Yes   Chaplain received a code stroke for the patient. When chaplain arrived medical staff were helping patient and there was no family present. Chaplain services remain available for spiritual and emotional support.

## 2023-09-12 DIAGNOSIS — Z992 Dependence on renal dialysis: Secondary | ICD-10-CM | POA: Diagnosis not present

## 2023-09-12 DIAGNOSIS — I10 Essential (primary) hypertension: Secondary | ICD-10-CM

## 2023-09-12 DIAGNOSIS — T7601XA Adult neglect or abandonment, suspected, initial encounter: Secondary | ICD-10-CM

## 2023-09-12 DIAGNOSIS — N186 End stage renal disease: Secondary | ICD-10-CM | POA: Diagnosis not present

## 2023-09-12 DIAGNOSIS — I132 Hypertensive heart and chronic kidney disease with heart failure and with stage 5 chronic kidney disease, or end stage renal disease: Secondary | ICD-10-CM | POA: Diagnosis not present

## 2023-09-12 DIAGNOSIS — T7401XA Adult neglect or abandonment, confirmed, initial encounter: Secondary | ICD-10-CM

## 2023-09-12 LAB — CBC
HCT: 25.7 % — ABNORMAL LOW (ref 36.0–46.0)
Hemoglobin: 8.2 g/dL — ABNORMAL LOW (ref 12.0–15.0)
MCH: 27.7 pg (ref 26.0–34.0)
MCHC: 31.9 g/dL (ref 30.0–36.0)
MCV: 86.8 fL (ref 80.0–100.0)
Platelets: 196 10*3/uL (ref 150–400)
RBC: 2.96 MIL/uL — ABNORMAL LOW (ref 3.87–5.11)
RDW: 18.5 % — ABNORMAL HIGH (ref 11.5–15.5)
WBC: 4.2 10*3/uL (ref 4.0–10.5)
nRBC: 0 % (ref 0.0–0.2)

## 2023-09-12 LAB — LIPID PANEL
Cholesterol: 176 mg/dL (ref 0–200)
HDL: 87 mg/dL (ref >40)
LDL Cholesterol: 74 mg/dL (ref 0–99)
Total CHOL/HDL Ratio: 2 ratio
Triglycerides: 77 mg/dL (ref <150)
VLDL: 15 mg/dL (ref 0–40)

## 2023-09-12 LAB — RENAL FUNCTION PANEL
Albumin: 3.3 g/dL — ABNORMAL LOW (ref 3.5–5.0)
Anion gap: 11 (ref 5–15)
BUN: 86 mg/dL — ABNORMAL HIGH (ref 6–20)
CO2: 26 mmol/L (ref 22–32)
Calcium: 8.5 mg/dL — ABNORMAL LOW (ref 8.9–10.3)
Chloride: 101 mmol/L (ref 98–111)
Creatinine, Ser: 7.5 mg/dL — ABNORMAL HIGH (ref 0.44–1.00)
GFR, Estimated: 6 mL/min — ABNORMAL LOW (ref >60)
Glucose, Bld: 85 mg/dL (ref 70–99)
Phosphorus: 4.4 mg/dL (ref 2.5–4.6)
Potassium: 5.1 mmol/L (ref 3.5–5.1)
Sodium: 138 mmol/L (ref 135–145)

## 2023-09-12 LAB — HEMOGLOBIN A1C
Hgb A1c MFr Bld: 4.4 % — ABNORMAL LOW (ref 4.8–5.6)
Mean Plasma Glucose: 80 mg/dL

## 2023-09-12 MED ORDER — ALTEPLASE 2 MG IJ SOLR: 2.0000 mg | Freq: Once | INTRAMUSCULAR | Status: DC | PRN

## 2023-09-12 MED ORDER — HEPARIN SODIUM (PORCINE) 1000 UNIT/ML IJ SOLN
INTRAMUSCULAR | Status: AC
  Filled 2023-09-12: qty 10

## 2023-09-12 MED ORDER — TERAZOSIN HCL 2 MG PO CAPS
2.0000 mg | ORAL_CAPSULE | Freq: Two times a day (BID) | ORAL | Status: DC
  Administered 2023-09-12 – 2023-09-23 (×22): 2 mg via ORAL
  Filled 2023-09-12 (×23): qty 1

## 2023-09-12 MED ORDER — ISOSORBIDE MONONITRATE ER 30 MG PO TB24
120.0000 mg | ORAL_TABLET | Freq: Every day | ORAL | Status: DC
  Administered 2023-09-13 – 2023-09-23 (×11): 120 mg via ORAL
  Filled 2023-09-12 (×11): qty 4

## 2023-09-12 MED ORDER — HEPARIN SODIUM (PORCINE) 1000 UNIT/ML DIALYSIS: 1000.0000 [IU] | INTRAMUSCULAR | Status: DC | PRN

## 2023-09-12 MED ORDER — CLONIDINE HCL 0.1 MG PO TABS
0.2000 mg | ORAL_TABLET | ORAL | Status: AC
  Administered 2023-09-12: 0.2 mg via ORAL
  Filled 2023-09-12: qty 2

## 2023-09-12 MED ORDER — HYDRALAZINE HCL 50 MG PO TABS
100.0000 mg | ORAL_TABLET | Freq: Three times a day (TID) | ORAL | Status: DC
  Administered 2023-09-12 – 2023-09-13 (×2): 100 mg via ORAL
  Filled 2023-09-12 (×2): qty 2

## 2023-09-12 MED ORDER — LABETALOL HCL 200 MG PO TABS
600.0000 mg | ORAL_TABLET | Freq: Two times a day (BID) | ORAL | Status: DC
  Administered 2023-09-12 – 2023-09-23 (×22): 600 mg via ORAL
  Filled 2023-09-12 (×24): qty 3

## 2023-09-12 NOTE — Subjective & Objective (Addendum)
 Pt seen and examined. Events of yesterday reviewed. Code stroke and pt seen by neurologist.  MRI brian negative for acute CVA  Pt's BP running high after HD. Has refractory HTN. On 6 different HTN meds.
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
## 2023-09-12 NOTE — Assessment & Plan Note (Addendum)
 09-12-2023 pt on hytrin 2 mg bid, labetalol 600 mgm bid, imdur 120 mg daily, avapro 300 mg at bedtime, hydralazine 100 mg tid, norvasc 10 mg daily.  In reviewing her flowsheet, it appears she gets severely hypertensive on HD days.  Will reach on to nephrology to see if they have any thoughts on what to BP med to add next.  Discussed with nephrology. Apparently standing Pre-Dialysis orders on her HD days were to hold her HTN meds prior to HD session. Nephrology has removed this orders and re-adjusted her AM HTN med administration times.  I have messaged pt's RN to re-enforce that pt should receive her BP meds prior to going to HD sessions.

## 2023-09-12 NOTE — Progress Notes (Signed)
 [facility]  ROUNDING NOTE   Subjective:  Patient seen hemodialysis tolerating tolerated all treatment well.  No complaints.  Hemodialysis dialysis treatment flowsheet  Blood flow rate (mL/min): 399 Arterial pressures (mmHg): -184.84 Venous pressures (mmHg): 125.25 TMP (mmHg): 16.97 Ultrafiltration rate (mL/min): 1028 Dialysate (mL/min): 300   Objective:  Vital signs in last 24 hours:  Temp:  [97.9 F (36.6 C)-98.6 F (37 C)] 98.5 F (36.9 C) (04/12 1216) Pulse Rate:  [76-98] 84 (04/12 1216) Resp:  [10-24] 16 (04/12 1216) BP: (138-185)/(91-126) 185/126 (04/12 1330) SpO2:  [99 %-100 %] 100 % (04/12 1216) Weight:  [66.8 kg] 66.8 kg (04/12 0740)  Weight change:  Filed Weights   09/10/23 0921 09/10/23 1230 09/12/23 0740  Weight: 68 kg 65.9 kg 66.8 kg    Intake/Output: No intake/output data recorded.   Intake/Output this shift:  Total I/O In: -  Out: 2000 [Other:2000]  Physical Exam: General: NAD,   Head: Normocephalic, atraumatic. Moist oral mucosal membranes  Eyes: Anicteric, PERRL  Neck: Supple, trachea midline  Lungs:  Clear to auscultation  Heart: Regular rate and rhythm  Abdomen:  Soft, nontender,   Extremities:  No peripheral edema.  Neurologic: Nonfocal, moving all four extremities  Skin: No lesions  Access: Rt chest Permcath    Basic Metabolic Panel: Recent Labs  Lab 09/08/23 0806 09/10/23 0935 09/12/23 0900  NA 138 137 138  K 5.0 4.7 5.1  CL 101 100 101  CO2 25 23 26   GLUCOSE 88 79 85  BUN 107* 97* 86*  CREATININE 8.20* 7.50* 7.50*  CALCIUM 8.5* 8.6* 8.5*  PHOS 5.0* 4.8* 4.4    Liver Function Tests: Recent Labs  Lab 09/08/23 0806 09/10/23 0935 09/12/23 0900  ALBUMIN 3.5 3.7 3.3*   No results for input(s): "LIPASE", "AMYLASE" in the last 168 hours. No results for input(s): "AMMONIA" in the last 168 hours.  CBC: Recent Labs  Lab 09/08/23 0805 09/10/23 0935 09/12/23 0846  WBC 4.9 4.4 4.2  HGB 8.1* 8.6* 8.2*   HCT 25.4* 27.3* 25.7*  MCV 84.9 87.2 86.8  PLT 201 211 196    Cardiac Enzymes: No results for input(s): "CKTOTAL", "CKMB", "CKMBINDEX", "TROPONINI" in the last 168 hours.  BNP: Invalid input(s): "POCBNP"  CBG: Recent Labs  Lab 09/11/23 0736 09/11/23 1438  GLUCAP 77 107*    Microbiology: Results for orders placed or performed during the hospital encounter of 08/09/23  Resp panel by RT-PCR (RSV, Flu A&B, Covid) Anterior Nasal Swab     Status: None   Collection Time: 08/14/23  3:44 AM   Specimen: Anterior Nasal Swab  Result Value Ref Range Status   SARS Coronavirus 2 by RT PCR NEGATIVE NEGATIVE Final    Comment: (NOTE) SARS-CoV-2 target nucleic acids are NOT DETECTED.  The SARS-CoV-2 RNA is generally detectable in upper respiratory specimens during the acute phase of infection. The lowest concentration of SARS-CoV-2 viral copies this assay can detect is 138 copies/mL. A negative result does not preclude SARS-Cov-2 infection and should not be used as the sole basis for treatment or other patient management decisions. A negative result may occur with  improper specimen collection/handling, submission of specimen other than nasopharyngeal swab, presence of viral mutation(s) within the areas targeted by this assay, and inadequate number of viral copies(<138 copies/mL). A negative result must be combined with clinical observations, patient history, and epidemiological information. The expected result is Negative.  Fact Sheet for Patients:  BloggerCourse.com  Fact Sheet for Healthcare Providers:  SeriousBroker.it  This test is no t yet approved or cleared by the [place]  FDA and  has been authorized for detection and/or diagnosis of SARS-CoV-2 by FDA under an Emergency Use Authorization (EUA). This EUA will remain  in effect (meaning this test can be used) for the duration of the COVID-19 declaration under Section  564(b)(1) of the Act, 21 U.S.C.section 360bbb-3(b)(1), unless the authorization is terminated  or revoked sooner.       Influenza A by PCR NEGATIVE NEGATIVE Final   Influenza B by PCR NEGATIVE NEGATIVE Final    Comment: (NOTE) The Xpert Xpress SARS-CoV-2/FLU/RSV plus assay is intended as an aid in the diagnosis of influenza from Nasopharyngeal swab specimens and should not be used as a sole basis for treatment. Nasal washings and aspirates are unacceptable for Xpert Xpress SARS-CoV-2/FLU/RSV testing.  Fact Sheet for Patients: BloggerCourse.com  Fact Sheet for Healthcare Providers: SeriousBroker.it  This test is not yet approved or cleared by the [place]  FDA and has been authorized for detection and/or diagnosis of SARS-CoV-2 by FDA under an Emergency Use Authorization (EUA). This EUA will remain in effect (meaning this test can be used) for the duration of the COVID-19 declaration under Section 564(b)(1) of the Act, 21 U.S.C. section 360bbb-3(b)(1), unless the authorization is terminated or revoked.     Resp Syncytial Virus by PCR NEGATIVE NEGATIVE Final    Comment: (NOTE) Fact Sheet for Patients: BloggerCourse.com  Fact Sheet for Healthcare Providers: SeriousBroker.it  This test is not yet approved or cleared by the [place]  FDA and has been authorized for detection and/or diagnosis of SARS-CoV-2 by FDA under an Emergency Use Authorization (EUA). This EUA will remain in effect (meaning this test can be used) for the duration of the COVID-19 declaration under Section 564(b)(1) of the Act, 21 U.S.C. section 360bbb-3(b)(1), unless the authorization is terminated or revoked.  Performed at [facility], [address] [place], [place] [address]   Respiratory (~20 pathogens) panel by PCR     Status: None   Collection Time: 08/14/23  3:44 AM   Specimen:  Nasopharyngeal Swab; Respiratory  Result Value Ref Range Status   Adenovirus NOT DETECTED NOT DETECTED Final   Coronavirus 229E NOT DETECTED NOT DETECTED Final    Comment: (NOTE) The Coronavirus on the Respiratory Panel, DOES NOT test for the novel  Coronavirus (2019 nCoV)    Coronavirus HKU1 NOT DETECTED NOT DETECTED Final   Coronavirus NL63 NOT DETECTED NOT DETECTED Final   Coronavirus OC43 NOT DETECTED NOT DETECTED Final   Metapneumovirus NOT DETECTED NOT DETECTED Final   Rhinovirus / Enterovirus NOT DETECTED NOT DETECTED Final   Influenza A NOT DETECTED NOT DETECTED Final   Influenza B NOT DETECTED NOT DETECTED Final   Parainfluenza Virus 1 NOT DETECTED NOT DETECTED Final   Parainfluenza Virus 2 NOT DETECTED NOT DETECTED Final   Parainfluenza Virus 3 NOT DETECTED NOT DETECTED Final   Parainfluenza Virus 4 NOT DETECTED NOT DETECTED Final   Respiratory Syncytial Virus NOT DETECTED NOT DETECTED Final   Bordetella pertussis NOT DETECTED NOT DETECTED Final   Bordetella Parapertussis NOT DETECTED NOT DETECTED Final   Chlamydophila pneumoniae NOT DETECTED NOT DETECTED Final   Mycoplasma pneumoniae NOT DETECTED NOT DETECTED Final    Comment: Performed at [facility], [address] [place], [place] [address]  Culture, blood (Routine X 2) w Reflex to ID Panel     Status: None   Collection Time: 08/14/23  9:19 AM   Specimen:  BLOOD  Result Value Ref Range Status   Specimen Description BLOOD BLOOD RIGHT HAND  Final   Special Requests   Final    BOTTLES DRAWN AEROBIC AND ANAEROBIC Blood Culture adequate volume   Culture   Final    NO GROWTH 5 DAYS Performed at [facility], [address] [place], [place] [address]    Report Status 08/19/2023 FINAL  Final  Culture, blood (Routine X 2) w Reflex to ID Panel     Status: None   Collection Time: 08/14/23  9:19 AM   Specimen: BLOOD  Result Value Ref Range Status   Specimen Description BLOOD BLOOD LEFT HAND  Final    Special Requests   Final    BOTTLES DRAWN AEROBIC AND ANAEROBIC Blood Culture adequate volume   Culture   Final    NO GROWTH 5 DAYS Performed at [facility], [address] [place], [place] [address]    Report Status 08/19/2023 FINAL  Final  Ear culture     Status: None   Collection Time: 08/14/23 10:11 AM   Specimen: Ear; Other  Result Value Ref Range Status   Specimen Description   Final    EAR Performed at [facility], [address], [place], [place] [address]    Special Requests   Final    RIGHT EAR Performed at [facility], [address] [place], [place] [address]    Culture RARE STREPTOCOCCUS PNEUMONIAE  Final   Report Status 08/16/2023 FINAL  Final   Organism ID, Bacteria STREPTOCOCCUS PNEUMONIAE  Final      Susceptibility   Streptococcus pneumoniae - MIC*    ERYTHROMYCIN <=0.12 SENSITIVE Sensitive     LEVOFLOXACIN 1 SENSITIVE Sensitive     VANCOMYCIN 0.5 SENSITIVE Sensitive     PENICILLIN (meningitis) <=0.06 SENSITIVE Sensitive     PENO - penicillin <=0.06      PENICILLIN (non-meningitis) <=0.06 SENSITIVE Sensitive     PENICILLIN (oral) <=0.06 SENSITIVE Sensitive     CEFTRIAXONE (non-meningitis) <=0.12 SENSITIVE Sensitive     CEFTRIAXONE (meningitis) <=0.12 SENSITIVE Sensitive     * RARE STREPTOCOCCUS PNEUMONIAE    Coagulation Studies: No results for input(s): "LABPROT", "INR" in the last 72 hours.  Urinalysis: No results for input(s): "COLORURINE", "LABSPEC", "PHURINE", "GLUCOSEU", "HGBUR", "BILIRUBINUR", "KETONESUR", "PROTEINUR", "UROBILINOGEN", "NITRITE", "LEUKOCYTESUR" in the last 72 hours.  Invalid input(s): "APPERANCEUR"    Imaging: MR BRAIN WO CONTRAST Result Date: 09/11/2023 CLINICAL DATA:  Initial evaluation for acute neuro deficit, stroke suspected, left-sided numbness. EXAM: MRI HEAD WITHOUT CONTRAST TECHNIQUE: Multiplanar, multiecho pulse sequences of the brain and surrounding structures were obtained without  intravenous contrast. COMPARISON:  Comparison made with CT from earlier the same day. FINDINGS: Brain: Mild age-related cerebral atrophy. Patchy T2/FLAIR hyperintensity involving the periventricular and deep white matter both cerebral hemispheres, consistent with chronic small vessel ischemic disease, moderately advanced. Patchy involvement of the pons noted as well. Multiple remote lacunar infarcts present about the bilateral basal ganglia, thalami, and pons. Few tiny remote cerebellar infarcts noted. No convincing abnormal foci of restricted diffusion to suggest acute or subacute ischemia. Note made of an apparent punctate focus of diffusion signal abnormality at the left thalamus (series 5, image 73). Focus of susceptibility artifact within the adjacent left thalamus. As this would not fit with patient's symptoms, this is favored to artifactual. Gray-white matter differentiation otherwise maintained. No areas of chronic cortical infarction. No acute intracranial hemorrhage. Multiple scattered chronic micro hemorrhages noted, most pronounced about the  deep gray nuclei and cerebellum, most characteristic of chronic poorly controlled hypertension. No mass lesion, midline shift or mass effect. No hydrocephalus or extra-axial fluid collection. Pituitary gland grossly within normal limits. Vascular: Major intracranial vascular flow voids are maintained. Intracranial arterial circulation dolichoectatic in appearance, in keeping with chronic hypertension. Skull and upper cervical spine: Craniocervical junction within normal limits. Decreased T1 signal intensity noted within the visualized bone marrow, nonspecific, but most commonly related to anemia, smoking or obesity. No scalp soft tissue abnormality. Sinuses/Orbits: Globes orbital soft tissues within normal limits. Paranasal sinuses are clear. Large right mastoid effusion noted. Image nasopharynx unremarkable. Other: None. IMPRESSION: 1. No acute intracranial  abnormality. 2. Age-related cerebral atrophy with moderately advanced chronic microvascular ischemic disease, with multiple remote lacunar infarcts about the bilateral basal ganglia, thalami, pons, and cerebellum. 3. Multiple scattered chronic micro hemorrhages, most pronounced about the deep gray nuclei and cerebellum, most characteristic of chronic poorly controlled hypertension. 4. Large right mastoid effusion, of uncertain significance. Correlation with physical exam recommended. Case discussed by telephone at the time of interpretation on 09/11/2023 at 7:15 p.m. to provider COLLEEN STACK. Electronically Signed   By: Virgia Griffins M.D.   On: 09/11/2023 19:27   CT HEAD CODE STROKE WO CONTRAST` Result Date: 09/11/2023 CLINICAL DATA:  Code stroke. Provided history: Altered mental status. EXAM: CT HEAD WITHOUT CONTRAST TECHNIQUE: Contiguous axial images were obtained from the base of the skull through the vertex without intravenous contrast. RADIATION DOSE REDUCTION: This exam was performed according to the departmental dose-optimization program which includes automated exposure control, adjustment of the mA and/or kV according to patient size and/or use of iterative reconstruction technique. COMPARISON:  Head CT 08/14/2023.  Brain MRI 10/06/2022. FINDINGS: Brain: Generalized parenchymal atrophy. Small chronic cortically-based infarcts again demonstrated within the right temporo-occipital and left parietal lobes. Chronic lacunar infarcts within the bilateral cerebral hemispheric white matter and within/about the bilateral thalami, unchanged. Background advanced patchy and ill-defined hypoattenuation within the cerebral white matter, nonspecific but compatible with chronic small vessel ischemic disease. There is no acute intracranial hemorrhage. No acute demarcated cortical infarct. No extra-axial fluid collection. No evidence of an intracranial mass. No midline shift. Vascular: No hyperdense vessel.  Atherosclerotic calcifications. Skull: No calvarial fracture or aggressive osseous lesion. Sinuses/Orbits: No orbital mass or acute orbital finding. No significant paranasal sinus disease at the imaged levels. Other: Complete opacification of the mastoid air cells and partial opacification of the middle ear cavity on the right, progressed since the head CT of 08/14/2023. ASPECTS [facility] Stroke Program Early CT Score) - Ganglionic level infarction (caudate, lentiform nuclei, internal capsule, insula, M1-M3 cortex): 7 - Supraganglionic infarction (M4-M6 cortex): 3 Total score (0-10 with 10 being normal): 10 (when discounting chronic infarcts). No evidence of an acute intracranial abnormality. These results were communicated to Dr. Janett Medin at 3:22 pmon 4/11/2025by text page via the [facility] messaging system. IMPRESSION: 1.  No evidence of an acute intracranial abnormality. 2. Parenchymal atrophy, advanced chronic small vessel ischemic disease and chronic infarcts, as described. 3. Complete opacification of the mastoid air cells and partial opacification of the middle ear cavity on the right, progressed since the head CT of 08/14/2023. Electronically Signed   By: Bascom Lily D.O.   On: 09/11/2023 15:23     Medications:     amLODipine  10 mg Oral QPM   aspirin EC  81 mg Oral Daily   atorvastatin  40 mg Oral Daily   calcitRIOL  0.25 mcg Oral Q T,Th,Sa-HD  calcium carbonate  400 mg of elemental calcium Oral BID   Chlorhexidine Gluconate Cloth  6 each Topical Q0600   cinacalcet  30 mg Oral Q supper   epoetin alfa  10,000 Units Intravenous Q T,Th,Sa-HD   fluticasone  1 spray Each Nare Daily   gabapentin  200 mg Oral BID   heparin  5,000 Units Subcutaneous Q12H   hydrALAZINE  100 mg Oral TID   hydrOXYzine  25 mg Oral TID   irbesartan  300 mg Oral QPM   isosorbide mononitrate  120 mg Oral Daily   labetalol  600 mg Oral BID   pantoprazole  40 mg Oral Daily   polyethylene glycol  17 g Oral Daily    senna-docusate  2 tablet Oral BID   sevelamer carbonate  1,600 mg Oral TID with meals   terazosin  2 mg Oral BID   guaiFENesin-dextromethorphan, ondansetron (ZOFRAN) IV, oxyCODONE  Assessment/ Plan:  Ms. Denell Cothern is a 53 y.o.  female with past medical conditions including hypertension, CHF, anemia, and end-stage renal disease on hemodialysis, who was admitted to [facility] on 08/09/2023 for Hypertensive urgency [I16.0] Chest pain [R07.9] Nonspecific chest pain [R07.9] Anemia due to chronic kidney disease, on chronic dialysis (HCC) [N18.6, D63.1, Z99.2]   Outpatient dialysis clinic was arranged at [facility] on a TTS schedule, chair time at 530 am.  Due to extended admission, outpatient assignment will need to be confirmed and possibly resubmitted prior to discharge.   1.  Hypertensive urgency, on admission              Blood pressure slightly elevated during dialysis, 158/103.  Currently prescribed amlodipine, hydralazine, irbesartan, isosorbide, and labetalol.   2. Anemia of chronic kidney disease Hemoglobin & Hematocrit  Labs (Brief)          Component Value Date/Time    HGB 8.2 (L) 09/12/2023 0846    HCT 25.7 (L) 09/12/2023 0846    Hemoglobin 8.2, below optimal range. Will continue IV EPO with  dialysis treatment.    3.  End stage renal disease on hemodialysis.  Patient receiving dialysis today, UF goal 2 L as tolerated.  Next treatment scheduled for Tuesday   4. Secondary Hyperparathyroidism:   Recent Labs       Lab Results  Component Value Date    PTH 1,248 (H) 08/11/2023    CALCIUM 8.5 (L) 09/12/2023    PHOS 4.4  09/12/2023      Currently prescribed calcium carbonate, sevelamer and Cinacalcet. Continue calcitriol as prescribed.      LOS: 0 Keir Viernes Marisa Sickles 4/12/20251:54 PM

## 2023-09-12 NOTE — Progress Notes (Signed)
   09/12/23 1135  Vitals  Temp 98.2 F (36.8 C)  Temp Source Tympanic  BP (!) 166/99  MAP (mmHg) 119  BP Location Right Arm  BP Method Automatic  Pulse Rate 80  ECG Heart Rate 80  Resp (!) 23  Oxygen Therapy  SpO2 100 %  O2 Device Room Air  During Treatment Monitoring  Blood Flow Rate (mL/min) 0 mL/min  Arterial Pressure (mmHg) 19.19 mmHg  Venous Pressure (mmHg) -41.01 mmHg  TMP (mmHg) 13.33 mmHg  Ultrafiltration Rate (mL/min) 1255 mL/min  Dialysate Flow Rate (mL/min) 300 ml/min  Dialysate Potassium Concentration 2  Dialysate Calcium Concentration 2.5  Duration of HD Treatment -hour(s) 3.5 hour(s)  Cumulative Fluid Removed (mL) per Treatment  2000.22  Post Treatment  Dialyzer Clearance Lightly streaked  Hemodialysis Intake (mL) 2 mL  Liters Processed 83.9  Fluid Removed (mL) 2000 mL  Tolerated HD Treatment Yes  Post-Hemodialysis Comments tolerated well no s/s of distress to note  Hemodialysis Catheter Right Subclavian  No placement date or time found.   Orientation: Right  Access Location: Subclavian  Site Condition No complications  Blue Lumen Status Flushed;Blood return noted  Red Lumen Status Flushed;Blood return noted  Purple Lumen Status N/A  Catheter fill solution Heparin 1000 units/ml  Catheter fill volume (Arterial) 1.6 cc  Catheter fill volume (Venous) 1.6  Dressing Type Transparent  Dressing Status Antimicrobial disc/dressing in place  Drainage Description None  Post treatment catheter status Capped and Clamped   Received patient in bed to unit.  Alert and oriented.  Informed consent signed and in chart.   TX duration:3.5  Patient tolerated well.  Transported back to the room  Alert, without acute distress.  Hand-off given to patient's nurse.   Access used: Rt HD catheter Access issues: none  Total UF removed: 2000 Medication(s) given: epoetin 10000 Post HD VS: see above Post HD weight: 65.8   [facility]

## 2023-09-12 NOTE — Progress Notes (Signed)
 PT Cancellation Note  Patient Details Name: Allison Hill MRN: [id number] DOB: Jan 09, 1971   Cancelled Treatment:    Reason Eval/Treat Not Completed: Patient at procedure or test/unavailable, will attempt to see pt at a future date/time as medically appropriate.     Lavenia Post PT, DPT 09/12/23, 11:28 AM

## 2023-09-12 NOTE — Progress Notes (Addendum)
 SLP Cancellation Note  Patient Details Name: Allison Hill MRN: [id number] DOB: 1971-01-24   Cancelled treatment:       Reason Eval/Treat Not Completed: SLP screened, no needs identified, will sign off  Speech language evaluation order received following code stroke on 4/11. MRI completed 4/11 revealing, "No acute intracranial abnormality. Age-related cerebral atrophy with moderately advanced chronic microvascular ischemic disease, with multiple remote lacunar infarcts about the bilateral basal ganglia, thalami, pons, and cerebellum. Multiple scattered chronic micro hemorrhages, most pronounced about the deep gray nuclei and cerebellum, most characteristic of chronic poorly controlled hypertension. Large right mastoid effusion, of uncertain significance. Correlation with physical exam recommended."  Today pt seen following dialysis. Pt reports "slurred speech comes and goes" with minimal dysarthria noted during interview. Pt reports some confusion related to being in hospital, utilizing written memory aids for recall of new information, but denied concern regarding cognition. No family present for verification of baseline.    Based on lack of intracranial process/neuro involvement, acute SLP services is not indicated. If cognitive concerns persist- recommend follow up with PCP.   [place] Thijs Brunton Clapp, MS, CCC-SLP Speech Language Pathologist Rehab Services; [facility] [phone] (ascom)   [place] J Clapp 09/12/2023, 1:52 PM

## 2023-09-13 DIAGNOSIS — R079 Chest pain, unspecified: Secondary | ICD-10-CM | POA: Diagnosis not present

## 2023-09-13 DIAGNOSIS — T7601XA Adult neglect or abandonment, suspected, initial encounter: Secondary | ICD-10-CM | POA: Diagnosis not present

## 2023-09-13 DIAGNOSIS — I10 Essential (primary) hypertension: Secondary | ICD-10-CM | POA: Diagnosis not present

## 2023-09-13 DIAGNOSIS — N186 End stage renal disease: Secondary | ICD-10-CM | POA: Diagnosis not present

## 2023-09-13 DIAGNOSIS — I132 Hypertensive heart and chronic kidney disease with heart failure and with stage 5 chronic kidney disease, or end stage renal disease: Secondary | ICD-10-CM | POA: Diagnosis not present

## 2023-09-13 MED ORDER — HYDRALAZINE HCL 50 MG PO TABS
50.0000 mg | ORAL_TABLET | Freq: Three times a day (TID) | ORAL | Status: DC
  Administered 2023-09-13 – 2023-09-16 (×8): 50 mg via ORAL
  Filled 2023-09-13 (×9): qty 1
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
## 2023-09-13 NOTE — TOC Progression Note (Signed)
 Transition of Care [facility]) - Progression Note    Patient Details  Name: Allison Hill MRN: [id number] Date of Birth: 1971/04/03  Transition of [facility]) CM/SW Contact  Areta Beer, RN Phone Number: 09/13/2023, 4:37 PM  Clinical Narrative: 4/13: SDOH flag for transportation. Resources added to patient instructions via Care Coordination.    Katheryn Pandy MSN RN CM  RN Case Manager [place]  Transitions of Care Direct Dial: [phone] (Weekends Only) [facility] Main Office Phone: [phone] [facility] Fax: [phone] .[email or web address]       Expected Discharge Plan:  (TBD) Barriers to Discharge: Continued Medical Work up  Expected Discharge Plan and Services     Post Acute Care Choice:  (TBD) Living arrangements for the past 2 months: Single Family Home                                       Social Determinants of Health (SDOH) Interventions SDOH Screenings   Food Insecurity: No Food Insecurity (08/10/2023)  Recent Concern: Food Insecurity - High Risk (07/17/2023)   Received from [facility]  Housing: Low Risk  (08/10/2023)  Recent Concern: Housing - High Risk (07/22/2023)   Received from [facility]  Transportation Needs: Unmet Transportation Needs (08/10/2023)  Utilities: Not At Risk (08/10/2023)  Recent Concern: Utilities - Medium Risk (07/17/2023)   Received from [facility]  Financial Resource Strain: Patient Declined (01/03/2023)   Received from [facility]  Social Connections: Unknown (08/10/2023)  Tobacco Use: High Risk (08/10/2023)    Readmission Risk Interventions     No data to display

## 2023-09-13 NOTE — Plan of Care (Signed)
  Problem: Education: Goal: Understanding of cardiac disease, CV risk reduction, and recovery process will improve Outcome: Progressing   Problem: Activity: Goal: Ability to tolerate increased activity will improve Outcome: Progressing   Problem: Cardiac: Goal: Ability to achieve and maintain adequate cardiovascular perfusion will improve Outcome: Progressing   Problem: Health Behavior/Discharge Planning: Goal: Ability to safely manage health-related needs after discharge will improve Outcome: Progressing   Problem: Education: Goal: Knowledge of General Education information will improve Description: Including pain rating scale, medication(s)/side effects and non-pharmacologic comfort measures Outcome: Progressing   Problem: Health Behavior/Discharge Planning: Goal: Ability to manage health-related needs will improve Outcome: Progressing   Problem: Clinical Measurements: Goal: Ability to maintain clinical measurements within normal limits will improve Outcome: Progressing Goal: Will remain free from infection Outcome: Progressing Goal: Diagnostic test results will improve Outcome: Progressing Goal: Respiratory complications will improve Outcome: Progressing Goal: Cardiovascular complication will be avoided Outcome: Progressing   Problem: Activity: Goal: Risk for activity intolerance will decrease Outcome: Progressing   Problem: Nutrition: Goal: Adequate nutrition will be maintained Outcome: Progressing   Problem: Coping: Goal: Level of anxiety will decrease Outcome: Progressing   Problem: Elimination: Goal: Will not experience complications related to bowel motility Outcome: Progressing Goal: Will not experience complications related to urinary retention Outcome: Progressing   Problem: Pain Managment: Goal: General experience of comfort will improve and/or be controlled Outcome: Progressing   Problem: Safety: Goal: Ability to remain free from injury will  improve Outcome: Progressing   Problem: Skin Integrity: Goal: Risk for impaired skin integrity will decrease Outcome: Progressing   Problem: Education: Goal: Knowledge of disease or condition will improve Outcome: Progressing Goal: Knowledge of secondary prevention will improve (MUST DOCUMENT ALL) Outcome: Progressing Goal: Knowledge of patient specific risk factors will improve (DELETE if not current risk factor) Outcome: Progressing   Problem: Ischemic Stroke/TIA Tissue Perfusion: Goal: Complications of ischemic stroke/TIA will be minimized Outcome: Progressing   Problem: Coping: Goal: Will verbalize positive feelings about self Outcome: Progressing Goal: Will identify appropriate support needs Outcome: Progressing   Problem: Health Behavior/Discharge Planning: Goal: Ability to manage health-related needs will improve Outcome: Progressing Goal: Goals will be collaboratively established with patient/family Outcome: Progressing   Problem: Self-Care: Goal: Ability to participate in self-care as condition permits will improve Outcome: Progressing Goal: Verbalization of feelings and concerns over difficulty with self-care will improve Outcome: Progressing Goal: Ability to communicate needs accurately will improve Outcome: Progressing   Problem: Nutrition: Goal: Risk of aspiration will decrease Outcome: Progressing Goal: Dietary intake will improve Outcome: Progressing

## 2023-09-13 NOTE — Plan of Care (Signed)
 Patient educated on doctor orders of fluid restriction. I/Os and to refrain from ordering door dash food. Patient non-compliant

## 2023-09-13 NOTE — Discharge Instructions (Signed)
 Transportation Resources  Agency Name: [facility] Agency Address: [address] [place], [place] [address] Phone: [phone] Email: [email or web address] Website: [email or web address] Service(s) Offered: Housing services, self-sufficiency, congregate meal program, weatherization program, Field seismologist program, emergency food assistance,  housing counseling, home ownership program, wheels-towork program.  Agency Name: [facility] [employer] [phone]) Address: [address], [place], [place] [address] Phone: [phone] Website: [email or web address] Service(s) Offered: Transportation for [employer], subscription and demand response; Dial-a-Ride for citizens 49 years of age or older.  Agency Name: [facility] [employer] Social Services Address: [address] [place], [place] [address] Phone: [phone] Service(s) Offered: Child support services; child welfare services; food stamps; Medicaid; work first family assistance; and aid with fuel,  rent, food and medicine, transportation assistance.  Agency Name: Disabled [employer] (DAV) Transportation  Network Phone: [phone] Service(s) Offered: Transports veterans to the [facility] medical center. Call  forty-eight hours in advance and leave the name, telephone  number, date, and time of appointment. Veteran will be  contacted by the driver the day before the appointment to  arrange a pick up point   Transportation Resources  Agency Name: [facility] Agency Address: [address] [place], [place] [address] Phone: [phone] Email: [email or web address] Website: [email or web address] Service(s) Offered: Housing services, self-sufficiency, congregate meal program, weatherization program, Field seismologist program, emergency food assistance,  housing counseling, home ownership program, wheels-towork  program.  Agency Name: [facility] [employer] [phone]) Address: [address], [place], [place] [address] Phone: [phone] Website: [email or web address] Service(s) Offered: Transportation for [employer], subscription and demand response; Dial-a-Ride for citizens 26 years of age or older.  Agency Name: Department of Social Services Address: [address] [place], [place] [address] Phone: [phone] Service(s) Offered: Child support services; child welfare services; food stamps; Medicaid; work first family assistance; and aid with fuel,  rent, food and medicine, transportation assistance.  Agency Name: Disabled [employer] (DAV) Transportation  Network Phone: [phone] Service(s) Offered: Transports veterans to the [facility] medical center. Call  forty-eight hours in advance and leave the name, telephone  number, date, and time of appointment. Veteran will be  contacted by the driver the day before the appointment to  arrange a pick up point    [employer] currently provides door to door services. [employer] connects with PART daily for services to [facility]. [employer] also performs contract services to [employer] operates 27 vehicles, all but 3 mini-vans are equipped with lifts for special needs as well as the general public. [employer] drivers are each CDL certified and trained in First Aid and CPR. [employer] was established in 2002 by [employer]. An independent Industrial/product designer. [employer] operates via Cytogeneticist with required Research scientist (physical sciences) from [place]. [employer] provides over 80,000 passenger trips each year, including [facility] and [employer] sites.  Call at least by 11 AM one business day prior to needing transportation  [employer].                      [place], [place] [address]     Office  Hours: Monday-Friday  8 AM - 5 PM

## 2023-09-13 NOTE — Progress Notes (Signed)
 [facility]  ROUNDING NOTE   Subjective:   Patient seen in bed resting. Patient reports feeling sleepy and did not get much rest overnight. No complaints.  Objective:  Vital signs in last 24 hours:  Temp:  [98 F (36.7 C)-98.5 F (36.9 C)] 98 F (36.7 C) (04/13 0827) Pulse Rate:  [70-93] 70 (04/13 0827) Resp:  [16-20] 16 (04/13 0827) BP: (108-185)/(64-126) 109/64 (04/13 0827) SpO2:  [99 %-100 %] 100 % (04/13 0827) Weight:  [68 kg] 68 kg (04/13 0819)  Weight change:  Filed Weights   09/10/23 1230 09/12/23 0740 09/13/23 0819  Weight: 65.9 kg 66.8 kg 68 kg    Intake/Output: I/O last 3 completed shifts: In: -  Out: 2000 [Other:2000]   Intake/Output this shift:  No intake/output data recorded.  Physical Exam: General: NAD,   Head: Normocephalic, atraumatic. Moist oral mucosal membranes  Eyes: Anicteric, PERRL  Neck: Supple, trachea midline  Lungs:  RA, Clear to auscultation  Heart: Regular rate and rhythm  Abdomen:  Soft, nontender,   Extremities:  No peripheral edema.  Neurologic: Nonfocal, moving all four extremities  Skin: No lesions  Access: Rt chest permcath    Basic Metabolic Panel: Recent Labs  Lab 09/08/23 0806 09/10/23 0935 09/12/23 0900  NA 138 137 138  K 5.0 4.7 5.1  CL 101 100 101  CO2 25 23 26   GLUCOSE 88 79 85  BUN 107* 97* 86*  CREATININE 8.20* 7.50* 7.50*  CALCIUM 8.5* 8.6* 8.5*  PHOS 5.0* 4.8* 4.4    Liver Function Tests: Recent Labs  Lab 09/08/23 0806 09/10/23 0935 09/12/23 0900  ALBUMIN 3.5 3.7 3.3*   No results for input(s): "LIPASE", "AMYLASE" in the last 168 hours. No results for input(s): "AMMONIA" in the last 168 hours.  CBC: Recent Labs  Lab 09/08/23 0805 09/10/23 0935 09/12/23 0846  WBC 4.9 4.4 4.2  HGB 8.1* 8.6* 8.2*  HCT 25.4* 27.3* 25.7*  MCV 84.9 87.2 86.8  PLT 201 211 196    Cardiac Enzymes: No results for input(s): "CKTOTAL", "CKMB", "CKMBINDEX", "TROPONINI" in the last 168  hours.  BNP: Invalid input(s): "POCBNP"  CBG: Recent Labs  Lab 09/11/23 0736 09/11/23 1438  GLUCAP 77 107*    Microbiology: Results for orders placed or performed during the hospital encounter of 08/09/23  Resp panel by RT-PCR (RSV, Flu A&B, Covid) Anterior Nasal Swab     Status: None   Collection Time: 08/14/23  3:44 AM   Specimen: Anterior Nasal Swab  Result Value Ref Range Status   SARS Coronavirus 2 by RT PCR NEGATIVE NEGATIVE Final    Comment: (NOTE) SARS-CoV-2 target nucleic acids are NOT DETECTED.  The SARS-CoV-2 RNA is generally detectable in upper respiratory specimens during the acute phase of infection. The lowest concentration of SARS-CoV-2 viral copies this assay can detect is 138 copies/mL. A negative result does not preclude SARS-Cov-2 infection and should not be used as the sole basis for treatment or other patient management decisions. A negative result may occur with  improper specimen collection/handling, submission of specimen other than nasopharyngeal swab, presence of viral mutation(s) within the areas targeted by this assay, and inadequate number of viral copies(<138 copies/mL). A negative result must be combined with clinical observations, patient history, and epidemiological information. The expected result is Negative.  Fact Sheet for Patients:  BloggerCourse.com  Fact Sheet for Healthcare Providers:  SeriousBroker.it  This test is no t yet approved or cleared by the [place]  FDA and  has been  authorized for detection and/or diagnosis of SARS-CoV-2 by FDA under an Emergency Use Authorization (EUA). This EUA will remain  in effect (meaning this test can be used) for the duration of the COVID-19 declaration under Section 564(b)(1) of the Act, 21 U.S.C.section 360bbb-3(b)(1), unless the authorization is terminated  or revoked sooner.       Influenza A by PCR NEGATIVE NEGATIVE Final    Influenza B by PCR NEGATIVE NEGATIVE Final    Comment: (NOTE) The Xpert Xpress SARS-CoV-2/FLU/RSV plus assay is intended as an aid in the diagnosis of influenza from Nasopharyngeal swab specimens and should not be used as a sole basis for treatment. Nasal washings and aspirates are unacceptable for Xpert Xpress SARS-CoV-2/FLU/RSV testing.  Fact Sheet for Patients: BloggerCourse.com  Fact Sheet for Healthcare Providers: SeriousBroker.it  This test is not yet approved or cleared by the [place]  FDA and has been authorized for detection and/or diagnosis of SARS-CoV-2 by FDA under an Emergency Use Authorization (EUA). This EUA will remain in effect (meaning this test can be used) for the duration of the COVID-19 declaration under Section 564(b)(1) of the Act, 21 U.S.C. section 360bbb-3(b)(1), unless the authorization is terminated or revoked.     Resp Syncytial Virus by PCR NEGATIVE NEGATIVE Final    Comment: (NOTE) Fact Sheet for Patients: BloggerCourse.com  Fact Sheet for Healthcare Providers: SeriousBroker.it  This test is not yet approved or cleared by the [place]  FDA and has been authorized for detection and/or diagnosis of SARS-CoV-2 by FDA under an Emergency Use Authorization (EUA). This EUA will remain in effect (meaning this test can be used) for the duration of the COVID-19 declaration under Section 564(b)(1) of the Act, 21 U.S.C. section 360bbb-3(b)(1), unless the authorization is terminated or revoked.  Performed at [facility], [address] [place], [place] [address]   Respiratory (~20 pathogens) panel by PCR     Status: None   Collection Time: 08/14/23  3:44 AM   Specimen: Nasopharyngeal Swab; Respiratory  Result Value Ref Range Status   Adenovirus NOT DETECTED NOT DETECTED Final   Coronavirus 229E NOT DETECTED NOT DETECTED Final     Comment: (NOTE) The Coronavirus on the Respiratory Panel, DOES NOT test for the novel  Coronavirus (2019 nCoV)    Coronavirus HKU1 NOT DETECTED NOT DETECTED Final   Coronavirus NL63 NOT DETECTED NOT DETECTED Final   Coronavirus OC43 NOT DETECTED NOT DETECTED Final   Metapneumovirus NOT DETECTED NOT DETECTED Final   Rhinovirus / Enterovirus NOT DETECTED NOT DETECTED Final   Influenza A NOT DETECTED NOT DETECTED Final   Influenza B NOT DETECTED NOT DETECTED Final   Parainfluenza Virus 1 NOT DETECTED NOT DETECTED Final   Parainfluenza Virus 2 NOT DETECTED NOT DETECTED Final   Parainfluenza Virus 3 NOT DETECTED NOT DETECTED Final   Parainfluenza Virus 4 NOT DETECTED NOT DETECTED Final   Respiratory Syncytial Virus NOT DETECTED NOT DETECTED Final   Bordetella pertussis NOT DETECTED NOT DETECTED Final   Bordetella Parapertussis NOT DETECTED NOT DETECTED Final   Chlamydophila pneumoniae NOT DETECTED NOT DETECTED Final   Mycoplasma pneumoniae NOT DETECTED NOT DETECTED Final    Comment: Performed at [facility], [address] [place], [place] [address]  Culture, blood (Routine X 2) w Reflex to ID Panel     Status: None   Collection Time: 08/14/23  9:19 AM   Specimen: BLOOD  Result Value Ref Range Status   Specimen Description BLOOD BLOOD RIGHT HAND  Final  Special Requests   Final    BOTTLES DRAWN AEROBIC AND ANAEROBIC Blood Culture adequate volume   Culture   Final    NO GROWTH 5 DAYS Performed at [facility], [address] [place], [place] [address]    Report Status 08/19/2023 FINAL  Final  Culture, blood (Routine X 2) w Reflex to ID Panel     Status: None   Collection Time: 08/14/23  9:19 AM   Specimen: BLOOD  Result Value Ref Range Status   Specimen Description BLOOD BLOOD LEFT HAND  Final   Special Requests   Final    BOTTLES DRAWN AEROBIC AND ANAEROBIC Blood Culture adequate volume   Culture   Final    NO GROWTH 5 DAYS Performed at [facility],  [address] [place], [place] [address]    Report Status 08/19/2023 FINAL  Final  Ear culture     Status: None   Collection Time: 08/14/23 10:11 AM   Specimen: Ear; Other  Result Value Ref Range Status   Specimen Description   Final    EAR Performed at [facility], [address] [place], [place] [address]    Special Requests   Final    RIGHT EAR Performed at [facility], [address] [place], [place] [address]    Culture RARE STREPTOCOCCUS PNEUMONIAE  Final   Report Status 08/16/2023 FINAL  Final   Organism ID, Bacteria STREPTOCOCCUS PNEUMONIAE  Final      Susceptibility   Streptococcus pneumoniae - MIC*    ERYTHROMYCIN <=0.12 SENSITIVE Sensitive     LEVOFLOXACIN 1 SENSITIVE Sensitive     VANCOMYCIN 0.5 SENSITIVE Sensitive     PENICILLIN (meningitis) <=0.06 SENSITIVE Sensitive     PENO - penicillin <=0.06      PENICILLIN (non-meningitis) <=0.06 SENSITIVE Sensitive     PENICILLIN (oral) <=0.06 SENSITIVE Sensitive     CEFTRIAXONE (non-meningitis) <=0.12 SENSITIVE Sensitive     CEFTRIAXONE (meningitis) <=0.12 SENSITIVE Sensitive     * RARE STREPTOCOCCUS PNEUMONIAE    Coagulation Studies: No results for input(s): "LABPROT", "INR" in the last 72 hours.  Urinalysis: No results for input(s): "COLORURINE", "LABSPEC", "PHURINE", "GLUCOSEU", "HGBUR", "BILIRUBINUR", "KETONESUR", "PROTEINUR", "UROBILINOGEN", "NITRITE", "LEUKOCYTESUR" in the last 72 hours.  Invalid input(s): "APPERANCEUR"    Imaging: MR BRAIN WO CONTRAST Result Date: 09/11/2023 CLINICAL DATA:  Initial evaluation for acute neuro deficit, stroke suspected, left-sided numbness. EXAM: MRI HEAD WITHOUT CONTRAST TECHNIQUE: Multiplanar, multiecho pulse sequences of the brain and surrounding structures were obtained without intravenous contrast. COMPARISON:  Comparison made with CT from earlier the same day. FINDINGS: Brain: Mild age-related cerebral atrophy. Patchy T2/FLAIR hyperintensity  involving the periventricular and deep white matter both cerebral hemispheres, consistent with chronic small vessel ischemic disease, moderately advanced. Patchy involvement of the pons noted as well. Multiple remote lacunar infarcts present about the bilateral basal ganglia, thalami, and pons. Few tiny remote cerebellar infarcts noted. No convincing abnormal foci of restricted diffusion to suggest acute or subacute ischemia. Note made of an apparent punctate focus of diffusion signal abnormality at the left thalamus (series 5, image 73). Focus of susceptibility artifact within the adjacent left thalamus. As this would not fit with patient's symptoms, this is favored to artifactual. Gray-white matter differentiation otherwise maintained. No areas of chronic cortical infarction. No acute intracranial hemorrhage. Multiple scattered chronic micro hemorrhages noted, most pronounced about the deep gray nuclei and cerebellum, most characteristic of chronic poorly controlled hypertension. No mass lesion, midline shift or mass  effect. No hydrocephalus or extra-axial fluid collection. Pituitary gland grossly within normal limits. Vascular: Major intracranial vascular flow voids are maintained. Intracranial arterial circulation dolichoectatic in appearance, in keeping with chronic hypertension. Skull and upper cervical spine: Craniocervical junction within normal limits. Decreased T1 signal intensity noted within the visualized bone marrow, nonspecific, but most commonly related to anemia, smoking or obesity. No scalp soft tissue abnormality. Sinuses/Orbits: Globes orbital soft tissues within normal limits. Paranasal sinuses are clear. Large right mastoid effusion noted. Image nasopharynx unremarkable. Other: None. IMPRESSION: 1. No acute intracranial abnormality. 2. Age-related cerebral atrophy with moderately advanced chronic microvascular ischemic disease, with multiple remote lacunar infarcts about the bilateral basal  ganglia, thalami, pons, and cerebellum. 3. Multiple scattered chronic micro hemorrhages, most pronounced about the deep gray nuclei and cerebellum, most characteristic of chronic poorly controlled hypertension. 4. Large right mastoid effusion, of uncertain significance. Correlation with physical exam recommended. Case discussed by telephone at the time of interpretation on 09/11/2023 at 7:15 p.m. to provider COLLEEN STACK. Electronically Signed   By: Virgia Griffins M.D.   On: 09/11/2023 19:27   CT HEAD CODE STROKE WO CONTRAST` Result Date: 09/11/2023 CLINICAL DATA:  Code stroke. Provided history: Altered mental status. EXAM: CT HEAD WITHOUT CONTRAST TECHNIQUE: Contiguous axial images were obtained from the base of the skull through the vertex without intravenous contrast. RADIATION DOSE REDUCTION: This exam was performed according to the departmental dose-optimization program which includes automated exposure control, adjustment of the mA and/or kV according to patient size and/or use of iterative reconstruction technique. COMPARISON:  Head CT 08/14/2023.  Brain MRI 10/06/2022. FINDINGS: Brain: Generalized parenchymal atrophy. Small chronic cortically-based infarcts again demonstrated within the right temporo-occipital and left parietal lobes. Chronic lacunar infarcts within the bilateral cerebral hemispheric white matter and within/about the bilateral thalami, unchanged. Background advanced patchy and ill-defined hypoattenuation within the cerebral white matter, nonspecific but compatible with chronic small vessel ischemic disease. There is no acute intracranial hemorrhage. No acute demarcated cortical infarct. No extra-axial fluid collection. No evidence of an intracranial mass. No midline shift. Vascular: No hyperdense vessel. Atherosclerotic calcifications. Skull: No calvarial fracture or aggressive osseous lesion. Sinuses/Orbits: No orbital mass or acute orbital finding. No significant paranasal sinus  disease at the imaged levels. Other: Complete opacification of the mastoid air cells and partial opacification of the middle ear cavity on the right, progressed since the head CT of 08/14/2023. ASPECTS [facility] Stroke Program Early CT Score) - Ganglionic level infarction (caudate, lentiform nuclei, internal capsule, insula, M1-M3 cortex): 7 - Supraganglionic infarction (M4-M6 cortex): 3 Total score (0-10 with 10 being normal): 10 (when discounting chronic infarcts). No evidence of an acute intracranial abnormality. These results were communicated to Dr. Janett Medin at 3:22 pmon 4/11/2025by text page via the [facility] messaging system. IMPRESSION: 1.  No evidence of an acute intracranial abnormality. 2. Parenchymal atrophy, advanced chronic small vessel ischemic disease and chronic infarcts, as described. 3. Complete opacification of the mastoid air cells and partial opacification of the middle ear cavity on the right, progressed since the head CT of 08/14/2023. Electronically Signed   By: Bascom Lily D.O.   On: 09/11/2023 15:23     Medications:     amLODipine  10 mg Oral QPM   aspirin EC  81 mg Oral Daily   atorvastatin  40 mg Oral Daily   calcitRIOL  0.25 mcg Oral Q T,Th,Sa-HD   calcium carbonate  400 mg of elemental calcium Oral BID   Chlorhexidine Gluconate Cloth  6 each  Topical Q0600   cinacalcet  30 mg Oral Q supper   epoetin alfa  10,000 Units Intravenous Q T,Th,Sa-HD   fluticasone  1 spray Each Nare Daily   gabapentin  200 mg Oral BID   heparin  5,000 Units Subcutaneous Q12H   hydrALAZINE  50 mg Oral Q8H   hydrOXYzine  25 mg Oral TID   irbesartan  300 mg Oral QPM   isosorbide mononitrate  120 mg Oral Daily   labetalol  600 mg Oral BID   pantoprazole  40 mg Oral Daily   polyethylene glycol  17 g Oral Daily   senna-docusate  2 tablet Oral BID   sevelamer carbonate  1,600 mg Oral TID with meals   terazosin  2 mg Oral BID   guaiFENesin-dextromethorphan, ondansetron (ZOFRAN) IV,  oxyCODONE  Assessment/ Plan:  [name] is a 53 y.o.  female with past medical conditions including hypertension, CHF, anemia, and end-stage renal disease on hemodialysis, who was admitted to [facility] on 08/09/2023 for Hypertensive urgency [I16.0] Chest pain [R07.9] Nonspecific chest pain [R07.9] Anemia due to chronic kidney disease, on chronic dialysis (HCC) [N18.6, D63.1, Z99.2]   Outpatient dialysis clinic was arranged at [facility] on a TTS schedule, chair time at 530 am.  Due to extended admission, outpatient assignment will need to be confirmed and possibly resubmitted prior to discharge.   1.  Hypertensive urgency, on admission              Blood pressure slightly elevated during dialysis, 158/103.  Currently prescribed amlodipine, hydralazine, irbesartan, isosorbide, and labetalol. BP today 109/64, changed hydralazine to 50 mg Q8H   2. Anemia of chronic kidney disease Hemoglobin & Hematocrit  Labs (Brief)              Component Value Date/Time    HGB 8.2 (L) 09/12/2023 0846    HCT 25.7 (L) 09/12/2023 0846    Hemoglobin 8.2, below optimal range. Will continue IV EPO with  dialysis treatment.    3.  End stage renal disease on hemodialysis.  Patient received dialysis yesterday 2 L removed. Next treatment scheduled for Tuesday as scheduled.   4. Secondary Hyperparathyroidism:   Recent Labs           Lab Results  Component Value Date    PTH 1,248 (H) 08/11/2023    CALCIUM 8.5 (L) 09/12/2023    PHOS 4.4  09/12/2023      Currently prescribed calcium carbonate, sevelamer and Cinacalcet. Continue calcitriol as prescribed.    LOS: 0 Adriel Kessen Marisa Sickles 4/13/202512:15 PM

## 2023-09-13 NOTE — Plan of Care (Signed)
  Problem: Activity: Goal: Ability to tolerate increased activity will improve Outcome: Progressing   Problem: Clinical Measurements: Goal: Ability to maintain clinical measurements within normal limits will improve Outcome: Progressing   Problem: Nutrition: Goal: Adequate nutrition will be maintained Outcome: Progressing   Problem: Safety: Goal: Ability to remain free from injury will improve Outcome: Progressing

## 2023-09-14 DIAGNOSIS — I132 Hypertensive heart and chronic kidney disease with heart failure and with stage 5 chronic kidney disease, or end stage renal disease: Secondary | ICD-10-CM | POA: Diagnosis not present

## 2023-09-14 DIAGNOSIS — T7601XA Adult neglect or abandonment, suspected, initial encounter: Secondary | ICD-10-CM | POA: Diagnosis not present

## 2023-09-14 DIAGNOSIS — N186 End stage renal disease: Secondary | ICD-10-CM | POA: Diagnosis not present

## 2023-09-14 DIAGNOSIS — I10 Essential (primary) hypertension: Secondary | ICD-10-CM | POA: Diagnosis not present

## 2023-09-14 DIAGNOSIS — R079 Chest pain, unspecified: Secondary | ICD-10-CM | POA: Diagnosis not present

## 2023-09-14 LAB — CBC
HCT: 28.7 % — ABNORMAL LOW (ref 36.0–46.0)
Hemoglobin: 9.3 g/dL — ABNORMAL LOW (ref 12.0–15.0)
MCH: 27.6 pg (ref 26.0–34.0)
MCHC: 32.4 g/dL (ref 30.0–36.0)
MCV: 85.2 fL (ref 80.0–100.0)
Platelets: 214 10*3/uL (ref 150–400)
RBC: 3.37 MIL/uL — ABNORMAL LOW (ref 3.87–5.11)
RDW: 18 % — ABNORMAL HIGH (ref 11.5–15.5)
WBC: 5.5 10*3/uL (ref 4.0–10.5)
nRBC: 0 % (ref 0.0–0.2)

## 2023-09-14 LAB — RENAL FUNCTION PANEL
Albumin: 3.5 g/dL (ref 3.5–5.0)
Anion gap: 15 (ref 5–15)
BUN: 88 mg/dL — ABNORMAL HIGH (ref 6–20)
CO2: 23 mmol/L (ref 22–32)
Calcium: 8.5 mg/dL — ABNORMAL LOW (ref 8.9–10.3)
Chloride: 101 mmol/L (ref 98–111)
Creatinine, Ser: 7.2 mg/dL — ABNORMAL HIGH (ref 0.44–1.00)
GFR, Estimated: 6 mL/min — ABNORMAL LOW (ref >60)
Glucose, Bld: 99 mg/dL (ref 70–99)
Phosphorus: 4.5 mg/dL (ref 2.5–4.6)
Potassium: 4.5 mmol/L (ref 3.5–5.1)
Sodium: 139 mmol/L (ref 135–145)

## 2023-09-14 LAB — BASIC METABOLIC PANEL WITH GFR
Anion gap: 13 (ref 5–15)
BUN: 88 mg/dL — ABNORMAL HIGH (ref 6–20)
CO2: 24 mmol/L (ref 22–32)
Calcium: 8.6 mg/dL — ABNORMAL LOW (ref 8.9–10.3)
Chloride: 102 mmol/L (ref 98–111)
Creatinine, Ser: 7.14 mg/dL — ABNORMAL HIGH (ref 0.44–1.00)
GFR, Estimated: 6 mL/min — ABNORMAL LOW (ref >60)
Glucose, Bld: 101 mg/dL — ABNORMAL HIGH (ref 70–99)
Potassium: 4.5 mmol/L (ref 3.5–5.1)
Sodium: 139 mmol/L (ref 135–145)

## 2023-09-14 MED ORDER — MELATONIN 5 MG PO TABS
5.0000 mg | ORAL_TABLET | Freq: Every evening | ORAL | Status: DC | PRN
  Administered 2023-09-14 – 2023-09-22 (×9): 5 mg via ORAL
  Filled 2023-09-14 (×9): qty 1

## 2023-09-14 MED ORDER — ACETAMINOPHEN 10 MG/ML IV SOLN
1000.0000 mg | Freq: Once | INTRAVENOUS | Status: AC
  Administered 2023-09-14: 1000 mg via INTRAVENOUS
  Filled 2023-09-14: qty 100

## 2023-09-14 MED ORDER — KETOROLAC TROMETHAMINE 15 MG/ML IJ SOLN: 15.0000 mg | Freq: Once | INTRAMUSCULAR | Status: DC
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
## 2023-09-14 NOTE — Plan of Care (Signed)
  Problem: Education: Goal: Understanding of cardiac disease, CV risk reduction, and recovery process will improve Outcome: Progressing   Problem: Activity: Goal: Ability to tolerate increased activity will improve Outcome: Progressing   Problem: Cardiac: Goal: Ability to achieve and maintain adequate cardiovascular perfusion will improve Outcome: Progressing   Problem: Health Behavior/Discharge Planning: Goal: Ability to safely manage health-related needs after discharge will improve Outcome: Progressing   Problem: Education: Goal: Knowledge of General Education information will improve Description: Including pain rating scale, medication(s)/side effects and non-pharmacologic comfort measures Outcome: Progressing   Problem: Health Behavior/Discharge Planning: Goal: Ability to manage health-related needs will improve Outcome: Progressing   Problem: Clinical Measurements: Goal: Ability to maintain clinical measurements within normal limits will improve Outcome: Progressing Goal: Will remain free from infection Outcome: Progressing Goal: Diagnostic test results will improve Outcome: Progressing Goal: Respiratory complications will improve Outcome: Progressing Goal: Cardiovascular complication will be avoided Outcome: Progressing   Problem: Activity: Goal: Risk for activity intolerance will decrease Outcome: Progressing   Problem: Nutrition: Goal: Adequate nutrition will be maintained Outcome: Progressing   Problem: Coping: Goal: Level of anxiety will decrease Outcome: Progressing   Problem: Elimination: Goal: Will not experience complications related to bowel motility Outcome: Progressing Goal: Will not experience complications related to urinary retention Outcome: Progressing   Problem: Pain Managment: Goal: General experience of comfort will improve and/or be controlled Outcome: Progressing   Problem: Safety: Goal: Ability to remain free from injury will  improve Outcome: Progressing   Problem: Skin Integrity: Goal: Risk for impaired skin integrity will decrease Outcome: Progressing   Problem: Education: Goal: Knowledge of disease or condition will improve Outcome: Progressing Goal: Knowledge of secondary prevention will improve (MUST DOCUMENT ALL) Outcome: Progressing Goal: Knowledge of patient specific risk factors will improve (DELETE if not current risk factor) Outcome: Progressing   Problem: Ischemic Stroke/TIA Tissue Perfusion: Goal: Complications of ischemic stroke/TIA will be minimized Outcome: Progressing   Problem: Coping: Goal: Will verbalize positive feelings about self Outcome: Progressing Goal: Will identify appropriate support needs Outcome: Progressing   Problem: Health Behavior/Discharge Planning: Goal: Ability to manage health-related needs will improve Outcome: Progressing Goal: Goals will be collaboratively established with patient/family Outcome: Progressing   Problem: Self-Care: Goal: Ability to participate in self-care as condition permits will improve Outcome: Progressing Goal: Verbalization of feelings and concerns over difficulty with self-care will improve Outcome: Progressing Goal: Ability to communicate needs accurately will improve Outcome: Progressing   Problem: Nutrition: Goal: Risk of aspiration will decrease Outcome: Progressing Goal: Dietary intake will improve Outcome: Progressing

## 2023-09-15 DIAGNOSIS — R079 Chest pain, unspecified: Secondary | ICD-10-CM | POA: Diagnosis not present

## 2023-09-15 DIAGNOSIS — N186 End stage renal disease: Secondary | ICD-10-CM | POA: Diagnosis not present

## 2023-09-15 DIAGNOSIS — I160 Hypertensive urgency: Secondary | ICD-10-CM | POA: Diagnosis not present

## 2023-09-15 DIAGNOSIS — T7601XA Adult neglect or abandonment, suspected, initial encounter: Secondary | ICD-10-CM | POA: Diagnosis not present

## 2023-09-15 DIAGNOSIS — I132 Hypertensive heart and chronic kidney disease with heart failure and with stage 5 chronic kidney disease, or end stage renal disease: Secondary | ICD-10-CM | POA: Diagnosis not present

## 2023-09-15 LAB — BASIC METABOLIC PANEL WITH GFR
Anion gap: 13 (ref 5–15)
BUN: 103 mg/dL — ABNORMAL HIGH (ref 6–20)
CO2: 23 mmol/L (ref 22–32)
Calcium: 8.2 mg/dL — ABNORMAL LOW (ref 8.9–10.3)
Chloride: 103 mmol/L (ref 98–111)
Creatinine, Ser: 8.13 mg/dL — ABNORMAL HIGH (ref 0.44–1.00)
GFR, Estimated: 5 mL/min — ABNORMAL LOW (ref >60)
Glucose, Bld: 89 mg/dL (ref 70–99)
Potassium: 5.6 mmol/L — ABNORMAL HIGH (ref 3.5–5.1)
Sodium: 139 mmol/L (ref 135–145)

## 2023-09-15 LAB — CBC
HCT: 25.5 % — ABNORMAL LOW (ref 36.0–46.0)
Hemoglobin: 8.5 g/dL — ABNORMAL LOW (ref 12.0–15.0)
MCH: 28.1 pg (ref 26.0–34.0)
MCHC: 33.3 g/dL (ref 30.0–36.0)
MCV: 84.2 fL (ref 80.0–100.0)
Platelets: 220 10*3/uL (ref 150–400)
RBC: 3.03 MIL/uL — ABNORMAL LOW (ref 3.87–5.11)
RDW: 18 % — ABNORMAL HIGH (ref 11.5–15.5)
WBC: 5.4 10*3/uL (ref 4.0–10.5)
nRBC: 0 % (ref 0.0–0.2)

## 2023-09-15 MED ORDER — HYDRALAZINE HCL 20 MG/ML IJ SOLN
5.0000 mg | Freq: Four times a day (QID) | INTRAMUSCULAR | Status: DC | PRN
  Administered 2023-09-16: 5 mg via INTRAVENOUS
  Filled 2023-09-15: qty 1

## 2023-09-15 NOTE — Progress Notes (Signed)
 [facility]  ROUNDING NOTE   Subjective:   Patient seen and evaluated during dialysis.    HEMODIALYSIS FLOWSHEET:  Blood Flow Rate (mL/min): 349 mL/min Arterial Pressure (mmHg): -162.21 mmHg Venous Pressure (mmHg): 139.59 mmHg TMP (mmHg): -0.2 mmHg Ultrafiltration Rate (mL/min): 1029 mL/min Dialysate Flow Rate (mL/min): 300 ml/min Dialysis Fluid Bolus: Normal Saline Bolus Amount (mL): 300 mL  Receiving dialysis seated in recliner Tolerating treatment well No complaints.   Objective:  Vital signs in last 24 hours:  Temp:  [97.8 F (36.6 C)-98.2 F (36.8 C)] 98.2 F (36.8 C) (04/15 1232) Pulse Rate:  [75-87] 75 (04/15 1307) Resp:  [11-18] 11 (04/15 1307) BP: (130-160)/(93-100) 160/100 (04/15 1307) SpO2:  [100 %] 100 % (04/15 1307) Weight:  [70.8 kg-71.5 kg] 70.8 kg (04/15 1232)  Weight change: 3.5 kg Filed Weights   09/14/23 0500 09/15/23 0510 09/15/23 1232  Weight: 70 kg 71.5 kg 70.8 kg    Intake/Output: I/O last 3 completed shifts: In: 600 [P.O.:600] Out: 210 [Urine:210]   Intake/Output this shift:  Total I/O In: 120 [P.O.:120] Out: -   Physical Exam: General: NAD  Head: Normocephalic, atraumatic. Moist oral mucosal membranes  Eyes: Anicteric  Lungs:  Clear to auscultation  Heart: Regular rate and rhythm  Abdomen:  Soft, nontender  Extremities: No peripheral edema.  Neurologic: Alert, moving all four extremities  Skin: No lesions  Access: Rt Chest Permcath    Basic Metabolic Panel: Recent Labs  Lab 09/10/23 0935 09/12/23 0900 09/14/23 0538 09/15/23 0418  NA 137 138 139  139 139  K 4.7 5.1 4.5  4.5 5.6*  CL 100 101 101  102 103  CO2 23 26 23  24 23   GLUCOSE 79 85 99  101* 89  BUN 97* 86* 88*  88* 103*  CREATININE 7.50* 7.50* 7.20*  7.14* 8.13*  CALCIUM 8.6* 8.5* 8.5*  8.6* 8.2*  PHOS 4.8* 4.4 4.5  --     Liver Function Tests: Recent Labs  Lab 09/10/23 0935 09/12/23 0900 09/14/23 0538  ALBUMIN 3.7 3.3* 3.5     No results for input(s): "LIPASE", "AMYLASE" in the last 168 hours. No results for input(s): "AMMONIA" in the last 168 hours.  CBC: Recent Labs  Lab 09/10/23 0935 09/12/23 0846 09/14/23 0538 09/15/23 0418  WBC 4.4 4.2 5.5 5.4  HGB 8.6* 8.2* 9.3* 8.5*  HCT 27.3* 25.7* 28.7* 25.5*  MCV 87.2 86.8 85.2 84.2  PLT 211 196 214 220    Cardiac Enzymes: No results for input(s): "CKTOTAL", "CKMB", "CKMBINDEX", "TROPONINI" in the last 168 hours.  BNP: Invalid input(s): "POCBNP"  CBG: Recent Labs  Lab 09/11/23 0736 09/11/23 1438  GLUCAP 77 107*    Microbiology: Results for orders placed or performed during the hospital encounter of 08/09/23  Resp panel by RT-PCR (RSV, Flu A&B, Covid) Anterior Nasal Swab     Status: None   Collection Time: 08/14/23  3:44 AM   Specimen: Anterior Nasal Swab  Result Value Ref Range Status   SARS Coronavirus 2 by RT PCR NEGATIVE NEGATIVE Final    Comment: (NOTE) SARS-CoV-2 target nucleic acids are NOT DETECTED.  The SARS-CoV-2 RNA is generally detectable in upper respiratory specimens during the acute phase of infection. The lowest concentration of SARS-CoV-2 viral copies this assay can detect is 138 copies/mL. A negative result does not preclude SARS-Cov-2 infection and should not be used as the sole basis for treatment or other patient management decisions. A negative result may occur with  improper specimen  collection/handling, submission of specimen other than nasopharyngeal swab, presence of viral mutation(s) within the areas targeted by this assay, and inadequate number of viral copies(<138 copies/mL). A negative result must be combined with clinical observations, patient history, and epidemiological information. The expected result is Negative.  Fact Sheet for Patients:  BloggerCourse.com  Fact Sheet for Healthcare Providers:  SeriousBroker.it  This test is no t yet approved or  cleared by the [place] FDA and  has been authorized for detection and/or diagnosis of SARS-CoV-2 by FDA under an Emergency Use Authorization (EUA). This EUA will remain  in effect (meaning this test can be used) for the duration of the COVID-19 declaration under Section 564(b)(1) of the Act, 21 U.S.C.section 360bbb-3(b)(1), unless the authorization is terminated  or revoked sooner.       Influenza A by PCR NEGATIVE NEGATIVE Final   Influenza B by PCR NEGATIVE NEGATIVE Final    Comment: (NOTE) The Xpert Xpress SARS-CoV-2/FLU/RSV plus assay is intended as an aid in the diagnosis of influenza from Nasopharyngeal swab specimens and should not be used as a sole basis for treatment. Nasal washings and aspirates are unacceptable for Xpert Xpress SARS-CoV-2/FLU/RSV testing.  Fact Sheet for Patients: BloggerCourse.com  Fact Sheet for Healthcare Providers: SeriousBroker.it  This test is not yet approved or cleared by the [place] FDA and has been authorized for detection and/or diagnosis of SARS-CoV-2 by FDA under an Emergency Use Authorization (EUA). This EUA will remain in effect (meaning this test can be used) for the duration of the COVID-19 declaration under Section 564(b)(1) of the Act, 21 U.S.C. section 360bbb-3(b)(1), unless the authorization is terminated or revoked.     Resp Syncytial Virus by PCR NEGATIVE NEGATIVE Final    Comment: (NOTE) Fact Sheet for Patients: BloggerCourse.com  Fact Sheet for Healthcare Providers: SeriousBroker.it  This test is not yet approved or cleared by the [place] FDA and has been authorized for detection and/or diagnosis of SARS-CoV-2 by FDA under an Emergency Use Authorization (EUA). This EUA will remain in effect (meaning this test can be used) for the duration of the COVID-19 declaration under Section 564(b)(1) of the Act, 21  U.S.C. section 360bbb-3(b)(1), unless the authorization is terminated or revoked.  Performed at [facility], [address] [place], [place] [address]   Respiratory (~20 pathogens) panel by PCR     Status: None   Collection Time: 08/14/23  3:44 AM   Specimen: Nasopharyngeal Swab; Respiratory  Result Value Ref Range Status   Adenovirus NOT DETECTED NOT DETECTED Final   Coronavirus 229E NOT DETECTED NOT DETECTED Final    Comment: (NOTE) The Coronavirus on the Respiratory Panel, DOES NOT test for the novel  Coronavirus (2019 nCoV)    Coronavirus HKU1 NOT DETECTED NOT DETECTED Final   Coronavirus NL63 NOT DETECTED NOT DETECTED Final   Coronavirus OC43 NOT DETECTED NOT DETECTED Final   Metapneumovirus NOT DETECTED NOT DETECTED Final   Rhinovirus / Enterovirus NOT DETECTED NOT DETECTED Final   Influenza A NOT DETECTED NOT DETECTED Final   Influenza B NOT DETECTED NOT DETECTED Final   Parainfluenza Virus 1 NOT DETECTED NOT DETECTED Final   Parainfluenza Virus 2 NOT DETECTED NOT DETECTED Final   Parainfluenza Virus 3 NOT DETECTED NOT DETECTED Final   Parainfluenza Virus 4 NOT DETECTED NOT DETECTED Final   Respiratory Syncytial Virus NOT DETECTED NOT DETECTED Final   Bordetella pertussis NOT DETECTED NOT DETECTED Final   Bordetella Parapertussis NOT DETECTED NOT DETECTED Final  Chlamydophila pneumoniae NOT DETECTED NOT DETECTED Final   Mycoplasma pneumoniae NOT DETECTED NOT DETECTED Final    Comment: Performed at [facility], [address] [place], [place] [address]  Culture, blood (Routine X 2) w Reflex to ID Panel     Status: None   Collection Time: 08/14/23  9:19 AM   Specimen: BLOOD  Result Value Ref Range Status   Specimen Description BLOOD BLOOD RIGHT HAND  Final   Special Requests   Final    BOTTLES DRAWN AEROBIC AND ANAEROBIC Blood Culture adequate volume   Culture   Final    NO GROWTH 5 DAYS Performed at [facility], [address]  [place], [place] [address]    Report Status 08/19/2023 FINAL  Final  Culture, blood (Routine X 2) w Reflex to ID Panel     Status: None   Collection Time: 08/14/23  9:19 AM   Specimen: BLOOD  Result Value Ref Range Status   Specimen Description BLOOD BLOOD LEFT HAND  Final   Special Requests   Final    BOTTLES DRAWN AEROBIC AND ANAEROBIC Blood Culture adequate volume   Culture   Final    NO GROWTH 5 DAYS Performed at [facility], [address] [place], [place] [address]    Report Status 08/19/2023 FINAL  Final  Ear culture     Status: None   Collection Time: 08/14/23 10:11 AM   Specimen: Ear; Other  Result Value Ref Range Status   Specimen Description   Final    EAR Performed at [facility], [address] [place], [place] [address]    Special Requests   Final    RIGHT EAR Performed at [facility], [address] [place], [place] [address]    Culture RARE STREPTOCOCCUS PNEUMONIAE  Final   Report Status 08/16/2023 FINAL  Final   Organism ID, Bacteria STREPTOCOCCUS PNEUMONIAE  Final      Susceptibility   Streptococcus pneumoniae - MIC*    ERYTHROMYCIN <=0.12 SENSITIVE Sensitive     LEVOFLOXACIN 1 SENSITIVE Sensitive     VANCOMYCIN 0.5 SENSITIVE Sensitive     PENICILLIN (meningitis) <=0.06 SENSITIVE Sensitive     PENO - penicillin <=0.06      PENICILLIN (non-meningitis) <=0.06 SENSITIVE Sensitive     PENICILLIN (oral) <=0.06 SENSITIVE Sensitive     CEFTRIAXONE (non-meningitis) <=0.12 SENSITIVE Sensitive     CEFTRIAXONE (meningitis) <=0.12 SENSITIVE Sensitive     * RARE STREPTOCOCCUS PNEUMONIAE    Coagulation Studies: No results for input(s): "LABPROT", "INR" in the last 72 hours.  Urinalysis: No results for input(s): "COLORURINE", "LABSPEC", "PHURINE", "GLUCOSEU", "HGBUR", "BILIRUBINUR", "KETONESUR", "PROTEINUR", "UROBILINOGEN", "NITRITE", "LEUKOCYTESUR" in the last 72 hours.  Invalid input(s): "APPERANCEUR"    Imaging: No results  found.    Medications:     amLODipine  10 mg Oral QPM   aspirin EC  81 mg Oral Daily   atorvastatin  40 mg Oral Daily   calcitRIOL  0.25 mcg Oral Q T,Th,Sa-HD   calcium carbonate  400 mg of elemental calcium Oral BID   Chlorhexidine Gluconate Cloth  6 each Topical Q0600   cinacalcet  30 mg Oral Q supper   epoetin alfa  10,000 Units Intravenous Q T,Th,Sa-HD   fluticasone  1 spray Each Nare Daily   gabapentin  200 mg Oral BID   heparin  5,000 Units Subcutaneous Q12H   hydrALAZINE  50 mg Oral Q8H   hydrOXYzine  25 mg Oral TID  irbesartan  300 mg Oral QPM   isosorbide mononitrate  120 mg Oral Daily   labetalol  600 mg Oral BID   pantoprazole  40 mg Oral Daily   polyethylene glycol  17 g Oral Daily   senna-docusate  2 tablet Oral BID   sevelamer carbonate  1,600 mg Oral TID with meals   terazosin  2 mg Oral BID   guaiFENesin-dextromethorphan, melatonin, ondansetron (ZOFRAN) IV, oxyCODONE  Assessment/ Plan:  [name] is a 53 y.o.  female  with past medical conditions including hypertension, CHF, anemia, and end-stage renal disease on hemodialysis, who was admitted to [facility] on 08/09/2023 for Hypertensive urgency [I16.0] Chest pain [R07.9] Nonspecific chest pain [R07.9] Anemia due to chronic kidney disease, on chronic dialysis (HCC) [N18.6, D63.1, Z99.2]  Outpatient dialysis clinic was arranged at [facility] on a TTS schedule, chair time at 530 am.  Due to extended admission, outpatient assignment will need to be confirmed and possibly resubmitted prior to discharge.   1.  Hypertensive urgency, on admission   Currently prescribed amlodipine, hydralazine, irbesartan, isosorbide, and labetalol. Blood pressure 143/101 during dialysis. Continues to require medication adjustment to achieve optimal control.   2. Anemia of chronic kidney disease Hemoglobin & Hematocrit     Component Value Date/Time   HGB 8.5 (L) 09/15/2023 0418   HCT 25.5 (L) 09/15/2023 0418   Hemoglobin below desired range. Continue EPO with  dialysis treatment.   3.  End stage renal disease on hemodialysis.  Patient receiving dialysis today, UF goal 2 L as tolerated.  Next treatment scheduled for Thursday.   4. Secondary Hyperparathyroidism:   Lab Results  Component Value Date   PTH 1,248 (H) 08/11/2023   CALCIUM 8.2 (L) 09/15/2023   PHOS 4.5 09/14/2023   Currently prescribed calcium carbonate, sevelamer and Cinacalcet. Continue calcitriol as prescribed.    LOS: 0 [facility] 4/15/20251:48 PM

## 2023-09-15 NOTE — Progress Notes (Signed)
 Hemodialysis Note:  Received patient in bed to unit. Alert and oriented. Informed consent singed and in chart.  Treatment initiated: 1307 Treatment completed: 1645  Access used: Right Subclavian catheter Access issues: None  Patient tolerated well. Transported back to room, alert without acute distress. Report given to patient's RN.  Total UF removed: 2 Liters Medications given: Epogen 10000 units IV  Post HD weight: 67.4 Kg  [facility]
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
## 2023-09-16 ENCOUNTER — Observation Stay

## 2023-09-16 DIAGNOSIS — R079 Chest pain, unspecified: Secondary | ICD-10-CM | POA: Diagnosis not present

## 2023-09-16 DIAGNOSIS — I132 Hypertensive heart and chronic kidney disease with heart failure and with stage 5 chronic kidney disease, or end stage renal disease: Secondary | ICD-10-CM | POA: Diagnosis not present

## 2023-09-16 LAB — BASIC METABOLIC PANEL WITH GFR
Anion gap: 10 (ref 5–15)
BUN: 60 mg/dL — ABNORMAL HIGH (ref 6–20)
CO2: 25 mmol/L (ref 22–32)
Calcium: 8.4 mg/dL — ABNORMAL LOW (ref 8.9–10.3)
Chloride: 99 mmol/L (ref 98–111)
Creatinine, Ser: 5.19 mg/dL — ABNORMAL HIGH (ref 0.44–1.00)
GFR, Estimated: 9 mL/min — ABNORMAL LOW (ref >60)
Glucose, Bld: 150 mg/dL — ABNORMAL HIGH (ref 70–99)
Potassium: 4 mmol/L (ref 3.5–5.1)
Sodium: 134 mmol/L — ABNORMAL LOW (ref 135–145)

## 2023-09-16 LAB — CBC
HCT: 27.5 % — ABNORMAL LOW (ref 36.0–46.0)
Hemoglobin: 9.2 g/dL — ABNORMAL LOW (ref 12.0–15.0)
MCH: 28.1 pg (ref 26.0–34.0)
MCHC: 33.5 g/dL (ref 30.0–36.0)
MCV: 84.1 fL (ref 80.0–100.0)
Platelets: 238 10*3/uL (ref 150–400)
RBC: 3.27 MIL/uL — ABNORMAL LOW (ref 3.87–5.11)
RDW: 17.6 % — ABNORMAL HIGH (ref 11.5–15.5)
WBC: 5.1 10*3/uL (ref 4.0–10.5)
nRBC: 0 % (ref 0.0–0.2)

## 2023-09-16 MED ORDER — HYDRALAZINE HCL 50 MG PO TABS
75.0000 mg | ORAL_TABLET | Freq: Three times a day (TID) | ORAL | Status: DC
  Administered 2023-09-16 – 2023-09-23 (×20): 75 mg via ORAL
  Filled 2023-09-16 (×20): qty 2

## 2023-09-16 NOTE — Progress Notes (Signed)
 Occupational Therapy Treatment Patient Details Name: Allison Hill MRN: [id number] DOB: [birth year] Today's Date: 09/16/2023   History of present illness Pt is a 53 y.o. female with medical history significant of ESRD on HD MWF, refractory HTN, chronic HFpEF, chronic iron deficiency anemia presented with new onset of chest pain, workup for hypertensive emergency.   OT comments  Pt is supine in bed on arrival. Fatigued, but pleasant and agreeable to short OT session. Reports L sided chronic pain/deficits. BP monitored throughout session and remains high, but okay to proceed with activity with highest reading of 154/104. Pt reporting 7/10 dizziness that did not worsen or improve. Pt performed bed mobility with SUP/MOD I and extra time and effort to reach EOB. CGA to don crocs seated EOB. STS from EOB to RW with CGA and for mobility to the sink and back ~6 ft. Standing grooming tasks performed at sink with unilateral support on sink to maintain balance. LLE does drag at times and she needed assist bringing it inside the walker during mobility.  Pt returned to bed with all needs in place and will cont to require skilled acute OT services to maximize her safety and IND to return to PLOF.       If plan is discharge home, recommend the following:  A little help with walking and/or transfers;A little help with bathing/dressing/bathroom;Assistance with cooking/housework;Assistance with feeding;Direct supervision/assist for medications management;Direct supervision/assist for financial management;Assist for transportation;Help with stairs or ramp for entrance;Supervision due to cognitive status   Equipment Recommendations  None recommended by OT    Recommendations for Other Services      Precautions / Restrictions Precautions Precautions: Fall Restrictions Weight Bearing Restrictions Per Provider Order: No       Mobility Bed Mobility Overal bed mobility: Modified Independent              General bed mobility comments: increased time and cueing to forward scoot    Transfers Overall transfer level: Needs assistance Equipment used: Rolling walker (2 wheels) Transfers: Sit to/from Stand Sit to Stand: Contact guard assist           General transfer comment: CGA for STS from EOB and in room mobility to sink and back ~6 feet total     Balance Overall balance assessment: Needs assistance Sitting-balance support: Feet supported Sitting balance-Leahy Scale: Good     Standing balance support: Bilateral upper extremity supported, During functional activity, Reliant on assistive device for balance Standing balance-Leahy Scale: Fair Standing balance comment: wide BOS, tends to get L foot caught outside of the rollator when turning to the left needed assist 1 time to bring LLE into walker                           ADL either performed or assessed with clinical judgement   ADL Overall ADL's : Needs assistance/impaired     Grooming: Wash/dry face;Standing;Oral care;Contact guard assist Grooming Details (indicate cue type and reason): CGA standing at sink for face washing, oral care with unilateral or BUE support on sink             Lower Body Dressing: Contact guard assist;Sitting/lateral leans Lower Body Dressing Details (indicate cue type and reason): don crocs               General ADL Comments: reports not sleeping well last night, tired today, agreeable to short ADL session, limited by dizziness this date    Extremity/Trunk Assessment  Vision       Perception     Praxis     Communication Communication Communication: Impaired Factors Affecting Communication: Difficulty expressing self (pt reports feeling more "slurred")   Cognition Arousal: Alert Behavior During Therapy: WFL for tasks assessed/performed Cognition: No family/caregiver present to determine baseline   Orientation impairments: Time,  Situation Awareness: Online awareness impaired Memory impairment (select all impairments): Declarative long-term memory Attention impairment (select first level of impairment): Sustained attention Executive functioning impairment (select all impairments): Reasoning, Problem solving, Sequencing OT - Cognition Comments: slow processing, VC for safety/rollator use                 Following commands: Impaired Following commands impaired: Follows multi-step commands inconsistently, Follows one step commands with increased time      Cueing   Cueing Techniques: Verbal cues, Tactile cues  Exercises      Shoulder Instructions       General Comments BPs monitored throughout: supine-149/89, sitting: 156/95 and after activity from session 154/104; reports 7/10 dizziness    Pertinent Vitals/ Pain       Pain Assessment Pain Assessment: No/denies pain Pain Intervention(s): Monitored during session  Home Living                                          Prior Functioning/Environment              Frequency  Min 1X/week        Progress Toward Goals  OT Goals(current goals can now be found in the care plan section)  Progress towards OT goals: Progressing toward goals  Acute Rehab OT Goals OT Goal Formulation: With patient Time For Goal Achievement: 09/23/23 Potential to Achieve Goals: Good  Plan      Co-evaluation                 AM-PAC OT "6 Clicks" Daily Activity     Outcome Measure   Help from another person eating meals?: None Help from another person taking care of personal grooming?: None Help from another person toileting, which includes using toliet, bedpan, or urinal?: A Little Help from another person bathing (including washing, rinsing, drying)?: A Little Help from another person to put on and taking off regular upper body clothing?: A Little Help from another person to put on and taking off regular lower body clothing?: A  Little 6 Click Score: 20    End of Session Equipment Utilized During Treatment: Rolling walker (2 wheels)  OT Visit Diagnosis: Unsteadiness on feet (R26.81);Other abnormalities of gait and mobility (R26.89);Muscle weakness (generalized) (M62.81)   Activity Tolerance Patient limited by fatigue   Patient Left in bed;with call bell/phone within reach;with bed alarm set   Nurse Communication Mobility status        Time: 1610-9604 OT Time Calculation (min): 23 min  Charges: OT General Charges $OT Visit: 1 Visit OT Treatments $Self Care/Home Management : 23-37 mins  Maya Scholer, OTR/L  09/16/23, 2:56 PM   Kaushal Vannice E Stayce Delancy 09/16/2023, 2:50 PM

## 2023-09-16 NOTE — Plan of Care (Signed)
  Problem: Education: Goal: Understanding of cardiac disease, CV risk reduction, and recovery process will improve Outcome: Progressing   Problem: Activity: Goal: Ability to tolerate increased activity will improve Outcome: Progressing   Problem: Cardiac: Goal: Ability to achieve and maintain adequate cardiovascular perfusion will improve Outcome: Progressing   Problem: Health Behavior/Discharge Planning: Goal: Ability to safely manage health-related needs after discharge will improve Outcome: Progressing   Problem: Clinical Measurements: Goal: Ability to maintain clinical measurements within normal limits will improve Outcome: Progressing Goal: Will remain free from infection Outcome: Progressing Goal: Respiratory complications will improve Outcome: Progressing Goal: Cardiovascular complication will be avoided Outcome: Progressing   Problem: Activity: Goal: Risk for activity intolerance will decrease Outcome: Progressing   Problem: Nutrition: Goal: Adequate nutrition will be maintained Outcome: Progressing   Problem: Coping: Goal: Level of anxiety will decrease Outcome: Progressing   Problem: Elimination: Goal: Will not experience complications related to bowel motility Outcome: Progressing Goal: Will not experience complications related to urinary retention Outcome: Progressing   Problem: Pain Managment: Goal: General experience of comfort will improve and/or be controlled Outcome: Progressing   Problem: Safety: Goal: Ability to remain free from injury will improve Outcome: Progressing   Problem: Skin Integrity: Goal: Risk for impaired skin integrity will decrease Outcome: Progressing

## 2023-09-16 NOTE — Progress Notes (Signed)
 Mobility Specialist - Progress Note   09/16/23 1500  Mobility  Activity Ambulated with assistance in hallway;Ambulated with assistance to bathroom  Level of Assistance Contact guard assist, steadying assist  Assistive Device Four wheel walker;Front wheel walker  Distance Ambulated (ft) 120 ft  Activity Response Tolerated well  Mobility visit 1 Mobility     Pt sitting EOB on arrival, utilizing RA. Pt agreeable to activity. Supv to don LB/UB clothing. Ambulated to bathroom and sink for output and face washing prior to continuation into hallway. Pt reported mild dizziness during session but did resolve "the longer I stand". Pt unsteady with ambulation. Wide BOS and constantly reaching out for furniture despite persistent cueing. RW negotiation to avoid obstacle collision and maintain safe RW-body distance. Pt returned to bed with alarm set, needs in reach.    Searcy Czech Mobility Specialist 09/16/23, 4:01 PM
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
## 2023-09-17 DIAGNOSIS — I132 Hypertensive heart and chronic kidney disease with heart failure and with stage 5 chronic kidney disease, or end stage renal disease: Secondary | ICD-10-CM | POA: Diagnosis not present

## 2023-09-17 LAB — RENAL FUNCTION PANEL
Albumin: 3.3 g/dL — ABNORMAL LOW (ref 3.5–5.0)
Anion gap: 10 (ref 5–15)
BUN: 88 mg/dL — ABNORMAL HIGH (ref 6–20)
CO2: 25 mmol/L (ref 22–32)
Calcium: 8.4 mg/dL — ABNORMAL LOW (ref 8.9–10.3)
Chloride: 103 mmol/L (ref 98–111)
Creatinine, Ser: 7.5 mg/dL — ABNORMAL HIGH (ref 0.44–1.00)
GFR, Estimated: 6 mL/min — ABNORMAL LOW (ref >60)
Glucose, Bld: 94 mg/dL (ref 70–99)
Phosphorus: 5.1 mg/dL — ABNORMAL HIGH (ref 2.5–4.6)
Potassium: 4.8 mmol/L (ref 3.5–5.1)
Sodium: 138 mmol/L (ref 135–145)

## 2023-09-17 LAB — CBC
HCT: 27.8 % — ABNORMAL LOW (ref 36.0–46.0)
Hemoglobin: 9 g/dL — ABNORMAL LOW (ref 12.0–15.0)
MCH: 27.6 pg (ref 26.0–34.0)
MCHC: 32.4 g/dL (ref 30.0–36.0)
MCV: 85.3 fL (ref 80.0–100.0)
Platelets: 231 10*3/uL (ref 150–400)
RBC: 3.26 MIL/uL — ABNORMAL LOW (ref 3.87–5.11)
RDW: 17.5 % — ABNORMAL HIGH (ref 11.5–15.5)
WBC: 4.5 10*3/uL (ref 4.0–10.5)
nRBC: 0 % (ref 0.0–0.2)

## 2023-09-17 MED ORDER — HEPARIN SODIUM (PORCINE) 1000 UNIT/ML IJ SOLN
INTRAMUSCULAR | Status: AC
  Filled 2023-09-17: qty 10

## 2023-09-17 NOTE — Progress Notes (Signed)
   09/17/23 1331  Vitals  Temp 98 F (36.7 C)  Temp Source Oral  BP (!) 163/114  MAP (mmHg) 130  BP Method Automatic  Pulse Rate 90  Pulse Rate Source Monitor  Resp 18  Oxygen Therapy  SpO2 100 %  O2 Device Room Air  Post Treatment  Dialyzer Clearance Lightly streaked  Hemodialysis Intake (mL) 0 mL  Liters Processed 75  Fluid Removed (mL) 2000 mL  Tolerated HD Treatment Yes  Post-Hemodialysis Comments tx completed  Hemodialysis Catheter Right Subclavian  No placement date or time found.   Orientation: Right  Access Location: Subclavian  Site Condition No complications  Blue Lumen Status Flushed;Blood return noted  Red Lumen Status Flushed;Blood return noted  Purple Lumen Status N/A  Catheter fill solution Heparin 1000 units/ml  Catheter fill volume (Arterial) 1.6 cc  Catheter fill volume (Venous) 1.6  Dressing Type Transparent  Dressing Status Antimicrobial disc/dressing in place  Drainage Description None  Dressing Change Due 09/19/23  Post treatment catheter status Capped and Clamped   Received patient in bed to unit.  Alert and oriented.  Informed consent signed and in chart.   TX duration:3.5  Patient tolerated well.  Transported back to the room  Alert, without acute distress.  Hand-off given to patient's nurse.   Access used: R HD Cath Access issues: none  Total UF removed: 2000 Medication(s) given: epoetin 2000 units Post HD VS: see above Post HD weight: 66.6   [facility]

## 2023-09-17 NOTE — Plan of Care (Signed)
  Problem: Education: Goal: Understanding of cardiac disease, CV risk reduction, and recovery process will improve Outcome: Progressing   Problem: Activity: Goal: Ability to tolerate increased activity will improve Outcome: Progressing   Problem: Cardiac: Goal: Ability to achieve and maintain adequate cardiovascular perfusion will improve Outcome: Progressing   Problem: Health Behavior/Discharge Planning: Goal: Ability to safely manage health-related needs after discharge will improve Outcome: Progressing   Problem: Education: Goal: Knowledge of General Education information will improve Description: Including pain rating scale, medication(s)/side effects and non-pharmacologic comfort measures Outcome: Progressing   Problem: Health Behavior/Discharge Planning: Goal: Ability to manage health-related needs will improve Outcome: Progressing   Problem: Clinical Measurements: Goal: Ability to maintain clinical measurements within normal limits will improve Outcome: Progressing Goal: Will remain free from infection Outcome: Progressing Goal: Diagnostic test results will improve Outcome: Progressing Goal: Respiratory complications will improve Outcome: Progressing Goal: Cardiovascular complication will be avoided Outcome: Progressing   Problem: Activity: Goal: Risk for activity intolerance will decrease Outcome: Progressing   Problem: Nutrition: Goal: Adequate nutrition will be maintained Outcome: Progressing   Problem: Coping: Goal: Level of anxiety will decrease Outcome: Progressing   Problem: Elimination: Goal: Will not experience complications related to bowel motility Outcome: Progressing Goal: Will not experience complications related to urinary retention Outcome: Progressing   Problem: Pain Managment: Goal: General experience of comfort will improve and/or be controlled Outcome: Progressing   Problem: Safety: Goal: Ability to remain free from injury will  improve Outcome: Progressing
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
## 2023-09-17 NOTE — Plan of Care (Signed)
  Problem: Education: Goal: Understanding of cardiac disease, CV risk reduction, and recovery process will improve 09/17/2023 0702 by Amedeo Bailiff, RN Outcome: Progressing 09/17/2023 0701 by Amedeo Bailiff, RN Outcome: Progressing   Problem: Activity: Goal: Ability to tolerate increased activity will improve 09/17/2023 0702 by Amedeo Bailiff, RN Outcome: Progressing 09/17/2023 0701 by Amedeo Bailiff, RN Outcome: Progressing   Problem: Cardiac: Goal: Ability to achieve and maintain adequate cardiovascular perfusion will improve 09/17/2023 0702 by Amedeo Bailiff, RN Outcome: Progressing 09/17/2023 0701 by Amedeo Bailiff, RN Outcome: Progressing   Problem: Health Behavior/Discharge Planning: Goal: Ability to safely manage health-related needs after discharge will improve 09/17/2023 0702 by Amedeo Bailiff, RN Outcome: Progressing 09/17/2023 0701 by Amedeo Bailiff, RN Outcome: Progressing   Problem: Education: Goal: Knowledge of General Education information will improve Description: Including pain rating scale, medication(s)/side effects and non-pharmacologic comfort measures 09/17/2023 0702 by Amedeo Bailiff, RN Outcome: Progressing 09/17/2023 0701 by Amedeo Bailiff, RN Outcome: Progressing   Problem: Health Behavior/Discharge Planning: Goal: Ability to manage health-related needs will improve 09/17/2023 0702 by Amedeo Bailiff, RN Outcome: Progressing 09/17/2023 0701 by Amedeo Bailiff, RN Outcome: Progressing   Problem: Clinical Measurements: Goal: Ability to maintain clinical measurements within normal limits will improve 09/17/2023 0702 by Amedeo Bailiff, RN Outcome: Progressing 09/17/2023 0701 by Amedeo Bailiff, RN Outcome: Progressing Goal: Will remain free from infection 09/17/2023 0702 by Amedeo Bailiff, RN Outcome: Progressing 09/17/2023 0701 by Amedeo Bailiff,  RN Outcome: Progressing Goal: Diagnostic test results will improve 09/17/2023 0702 by Amedeo Bailiff, RN Outcome: Progressing 09/17/2023 0701 by Amedeo Bailiff, RN Outcome: Progressing Goal: Respiratory complications will improve 09/17/2023 0702 by Amedeo Bailiff, RN Outcome: Progressing 09/17/2023 0701 by Amedeo Bailiff, RN Outcome: Progressing Goal: Cardiovascular complication will be avoided 09/17/2023 0702 by Amedeo Bailiff, RN Outcome: Progressing 09/17/2023 0701 by Amedeo Bailiff, RN Outcome: Progressing   Problem: Activity: Goal: Risk for activity intolerance will decrease 09/17/2023 0702 by Amedeo Bailiff, RN Outcome: Progressing 09/17/2023 0701 by Amedeo Bailiff, RN Outcome: Progressing   Problem: Nutrition: Goal: Adequate nutrition will be maintained 09/17/2023 0702 by Amedeo Bailiff, RN Outcome: Progressing 09/17/2023 0701 by Amedeo Bailiff, RN Outcome: Progressing   Problem: Coping: Goal: Level of anxiety will decrease 09/17/2023 0702 by Amedeo Bailiff, RN Outcome: Progressing 09/17/2023 0701 by Amedeo Bailiff, RN Outcome: Progressing   Problem: Elimination: Goal: Will not experience complications related to bowel motility 09/17/2023 0702 by Amedeo Bailiff, RN Outcome: Progressing 09/17/2023 0701 by Amedeo Bailiff, RN Outcome: Progressing Goal: Will not experience complications related to urinary retention 09/17/2023 0702 by Amedeo Bailiff, RN Outcome: Progressing 09/17/2023 0701 by Amedeo Bailiff, RN Outcome: Progressing   Problem: Pain Managment: Goal: General experience of comfort will improve and/or be controlled 09/17/2023 0702 by Amedeo Bailiff, RN Outcome: Progressing 09/17/2023 0701 by Amedeo Bailiff, RN Outcome: Progressing

## 2023-09-17 NOTE — Progress Notes (Signed)
 [facility]  ROUNDING NOTE   Subjective:   Patient seen and evaluated during dialysis   HEMODIALYSIS FLOWSHEET:  Blood Flow Rate (mL/min): 349 mL/min Arterial Pressure (mmHg): -144.64 mmHg Venous Pressure (mmHg): 141.2 mmHg TMP (mmHg): 8.68 mmHg Ultrafiltration Rate (mL/min): 829 mL/min Dialysate Flow Rate (mL/min): 299 ml/min Dialysis Fluid Bolus: Normal Saline Bolus Amount (mL): 300 mL  Receiving treatment seated in chair  Objective:  Vital signs in last 24 hours:  Temp:  [97.4 F (36.3 C)-98.2 F (36.8 C)] 97.5 F (36.4 C) (04/17 0834) Pulse Rate:  [75-95] 80 (04/17 1030) Resp:  [11-17] 11 (04/17 1030) BP: (132-180)/(90-105) 132/91 (04/17 1030) SpO2:  [100 %] 100 % (04/17 1030) Weight:  [68.7 kg-71.2 kg] 68.7 kg (04/17 0834)  Weight change: 0.4 kg Filed Weights   09/16/23 0500 09/17/23 0309 09/17/23 0834  Weight: 71.5 kg 71.2 kg 68.7 kg    Intake/Output: No intake/output data recorded.   Intake/Output this shift:  No intake/output data recorded.  Physical Exam: General: NAD  Head: Normocephalic, atraumatic. Moist oral mucosal membranes  Eyes: Anicteric  Lungs:  Clear to auscultation  Heart: Regular rate and rhythm  Abdomen:  Soft, nontender  Extremities: No peripheral edema.  Neurologic: Alert, moving all four extremities  Skin: No lesions  Access: Rt Chest Permcath    Basic Metabolic Panel: Recent Labs  Lab 09/12/23 0900 09/14/23 0538 09/15/23 0418 09/16/23 0305 09/17/23 0730  NA 138 139  139 139 134* 138  K 5.1 4.5  4.5 5.6* 4.0 4.8  CL 101 101  102 103 99 103  CO2 26 23  24 23 25 25   GLUCOSE 85 99  101* 89 150* 94  BUN 86* 88*  88* 103* 60* 88*  CREATININE 7.50* 7.20*  7.14* 8.13* 5.19* 7.50*  CALCIUM 8.5* 8.5*  8.6* 8.2* 8.4* 8.4*  PHOS 4.4 4.5  --   --  5.1*    Liver Function Tests: Recent Labs  Lab 09/12/23 0900 09/14/23 0538 09/17/23 0730  ALBUMIN 3.3* 3.5 3.3*    No results for input(s): "LIPASE",  "AMYLASE" in the last 168 hours. No results for input(s): "AMMONIA" in the last 168 hours.  CBC: Recent Labs  Lab 09/12/23 0846 09/14/23 0538 09/15/23 0418 09/16/23 0305 09/17/23 0730  WBC 4.2 5.5 5.4 5.1 4.5  HGB 8.2* 9.3* 8.5* 9.2* 9.0*  HCT 25.7* 28.7* 25.5* 27.5* 27.8*  MCV 86.8 85.2 84.2 84.1 85.3  PLT 196 214 220 238 231    Cardiac Enzymes: No results for input(s): "CKTOTAL", "CKMB", "CKMBINDEX", "TROPONINI" in the last 168 hours.  BNP: Invalid input(s): "POCBNP"  CBG: Recent Labs  Lab 09/11/23 0736 09/11/23 1438  GLUCAP 77 107*    Microbiology: Results for orders placed or performed during the hospital encounter of 08/09/23  Resp panel by RT-PCR (RSV, Flu A&B, Covid) Anterior Nasal Swab     Status: None   Collection Time: 08/14/23  3:44 AM   Specimen: Anterior Nasal Swab  Result Value Ref Range Status   SARS Coronavirus 2 by RT PCR NEGATIVE NEGATIVE Final    Comment: (NOTE) SARS-CoV-2 target nucleic acids are NOT DETECTED.  The SARS-CoV-2 RNA is generally detectable in upper respiratory specimens during the acute phase of infection. The lowest concentration of SARS-CoV-2 viral copies this assay can detect is 138 copies/mL. A negative result does not preclude SARS-Cov-2 infection and should not be used as the sole basis for treatment or other patient management decisions. A negative result may occur with  improper specimen collection/handling, submission of specimen other than nasopharyngeal swab, presence of viral mutation(s) within the areas targeted by this assay, and inadequate number of viral copies(<138 copies/mL). A negative result must be combined with clinical observations, patient history, and epidemiological information. The expected result is Negative.  Fact Sheet for Patients:  BloggerCourse.com  Fact Sheet for Healthcare Providers:  SeriousBroker.it  This test is no t yet approved or  cleared by the [place]  FDA and  has been authorized for detection and/or diagnosis of SARS-CoV-2 by FDA under an Emergency Use Authorization (EUA). This EUA will remain  in effect (meaning this test can be used) for the duration of the COVID-19 declaration under Section 564(b)(1) of the Act, 21 U.S.C.section 360bbb-3(b)(1), unless the authorization is terminated  or revoked sooner.       Influenza A by PCR NEGATIVE NEGATIVE Final   Influenza B by PCR NEGATIVE NEGATIVE Final    Comment: (NOTE) The Xpert Xpress SARS-CoV-2/FLU/RSV plus assay is intended as an aid in the diagnosis of influenza from Nasopharyngeal swab specimens and should not be used as a sole basis for treatment. Nasal washings and aspirates are unacceptable for Xpert Xpress SARS-CoV-2/FLU/RSV testing.  Fact Sheet for Patients: BloggerCourse.com  Fact Sheet for Healthcare Providers: SeriousBroker.it  This test is not yet approved or cleared by the [place]  FDA and has been authorized for detection and/or diagnosis of SARS-CoV-2 by FDA under an Emergency Use Authorization (EUA). This EUA will remain in effect (meaning this test can be used) for the duration of the COVID-19 declaration under Section 564(b)(1) of the Act, 21 U.S.C. section 360bbb-3(b)(1), unless the authorization is terminated or revoked.     Resp Syncytial Virus by PCR NEGATIVE NEGATIVE Final    Comment: (NOTE) Fact Sheet for Patients: BloggerCourse.com  Fact Sheet for Healthcare Providers: SeriousBroker.it  This test is not yet approved or cleared by the [place]  FDA and has been authorized for detection and/or diagnosis of SARS-CoV-2 by FDA under an Emergency Use Authorization (EUA). This EUA will remain in effect (meaning this test can be used) for the duration of the COVID-19 declaration under Section 564(b)(1) of the Act, 21  U.S.C. section 360bbb-3(b)(1), unless the authorization is terminated or revoked.  Performed at [facility], [address] [place], [place] [address]   Respiratory (~20 pathogens) panel by PCR     Status: None   Collection Time: 08/14/23  3:44 AM   Specimen: Nasopharyngeal Swab; Respiratory  Result Value Ref Range Status   Adenovirus NOT DETECTED NOT DETECTED Final   Coronavirus 229E NOT DETECTED NOT DETECTED Final    Comment: (NOTE) The Coronavirus on the Respiratory Panel, DOES NOT test for the novel  Coronavirus (2019 nCoV)    Coronavirus HKU1 NOT DETECTED NOT DETECTED Final   Coronavirus NL63 NOT DETECTED NOT DETECTED Final   Coronavirus OC43 NOT DETECTED NOT DETECTED Final   Metapneumovirus NOT DETECTED NOT DETECTED Final   Rhinovirus / Enterovirus NOT DETECTED NOT DETECTED Final   Influenza A NOT DETECTED NOT DETECTED Final   Influenza B NOT DETECTED NOT DETECTED Final   Parainfluenza Virus 1 NOT DETECTED NOT DETECTED Final   Parainfluenza Virus 2 NOT DETECTED NOT DETECTED Final   Parainfluenza Virus 3 NOT DETECTED NOT DETECTED Final   Parainfluenza Virus 4 NOT DETECTED NOT DETECTED Final   Respiratory Syncytial Virus NOT DETECTED NOT DETECTED Final   Bordetella pertussis NOT DETECTED NOT DETECTED Final   Bordetella Parapertussis NOT DETECTED NOT DETECTED Final  Chlamydophila pneumoniae NOT DETECTED NOT DETECTED Final   Mycoplasma pneumoniae NOT DETECTED NOT DETECTED Final    Comment: Performed at [facility], [address], [place], [place] [address]  Culture, blood (Routine X 2) w Reflex to ID Panel     Status: None   Collection Time: 08/14/23  9:19 AM   Specimen: BLOOD  Result Value Ref Range Status   Specimen Description BLOOD BLOOD RIGHT HAND  Final   Special Requests   Final    BOTTLES DRAWN AEROBIC AND ANAEROBIC Blood Culture adequate volume   Culture   Final    NO GROWTH 5 DAYS Performed at [facility], [address]  [place], [place] [address]    Report Status 08/19/2023 FINAL  Final  Culture, blood (Routine X 2) w Reflex to ID Panel     Status: None   Collection Time: 08/14/23  9:19 AM   Specimen: BLOOD  Result Value Ref Range Status   Specimen Description BLOOD BLOOD LEFT HAND  Final   Special Requests   Final    BOTTLES DRAWN AEROBIC AND ANAEROBIC Blood Culture adequate volume   Culture   Final    NO GROWTH 5 DAYS Performed at [facility], [address] [place], [place] [address]    Report Status 08/19/2023 FINAL  Final  Ear culture     Status: None   Collection Time: 08/14/23 10:11 AM   Specimen: Ear; Other  Result Value Ref Range Status   Specimen Description   Final    EAR Performed at [facility], [address] [place], [place] [address]    Special Requests   Final    RIGHT EAR Performed at [facility], [address] [place], [place] [address]    Culture RARE STREPTOCOCCUS PNEUMONIAE  Final   Report Status 08/16/2023 FINAL  Final   Organism ID, Bacteria STREPTOCOCCUS PNEUMONIAE  Final      Susceptibility   Streptococcus pneumoniae - MIC*    ERYTHROMYCIN <=0.12 SENSITIVE Sensitive     LEVOFLOXACIN 1 SENSITIVE Sensitive     VANCOMYCIN 0.5 SENSITIVE Sensitive     PENICILLIN (meningitis) <=0.06 SENSITIVE Sensitive     PENO - penicillin <=0.06      PENICILLIN (non-meningitis) <=0.06 SENSITIVE Sensitive     PENICILLIN (oral) <=0.06 SENSITIVE Sensitive     CEFTRIAXONE (non-meningitis) <=0.12 SENSITIVE Sensitive     CEFTRIAXONE (meningitis) <=0.12 SENSITIVE Sensitive     * RARE STREPTOCOCCUS PNEUMONIAE    Coagulation Studies: No results for input(s): "LABPROT", "INR" in the last 72 hours.  Urinalysis: No results for input(s): "COLORURINE", "LABSPEC", "PHURINE", "GLUCOSEU", "HGBUR", "BILIRUBINUR", "KETONESUR", "PROTEINUR", "UROBILINOGEN", "NITRITE", "LEUKOCYTESUR" in the last 72 hours.  Invalid input(s): "APPERANCEUR"    Imaging: US RENAL ARTERY  DUPLEX COMPLETE Result Date: 09/17/2023 CLINICAL DATA:  End-stage renal disease.  Malignant hypertension. EXAM: RENAL/URINARY TRACT ULTRASOUND RENAL DUPLEX DOPPLER ULTRASOUND COMPARISON:  CT abdomen pelvis 02/21/2023 FINDINGS: Right Kidney: Length: 6.9 cm. Diffusely echogenic and mildly thinned cortex. No hydronephrosis. 1.0 cm simple cyst in the lower pole of the right kidney does not require additional follow-up. 1.2 cm hypoechoic structure seen in the upper pole can not be characterized as a simple cyst. Left Kidney: Length: 6.4 cm. Diffusely increased echogenicity of the renal cortex without significant thinning. No hydronephrosis. Bladder: Grossly unremarkable. Not well evaluated due to underdistention. RENAL DUPLEX ULTRASOUND Right Renal Artery Velocities: Origin:  126 cm/sec Mid:  47 cm/sec Hilum:  81 cm/sec Interlobar:  52 cm/sec Arcuate:  47 cm/sec Left Renal Artery Velocities: Origin:  200 cm/sec Mid:  95 cm/sec Hilum:  75 cm/sec Interlobar:  54 cm/sec Arcuate:  34 cm/sec Aortic Velocity:  120 cm/sec Right Renal-Aortic Ratios: Origin: 1.05 Mid:  0.39 Hilum: 0.67 Interlobar: 0.43 Arcuate: 0.40 Left Renal-Aortic Ratios: Origin: 1.67 Mid: 0.79 Hilum: 0.29 Interlobar: 0.45 Arcuate: 0.28 IMPRESSION: 1. No definitive evidence of significant renal artery stenosis. Borderline elevated peak systolic velocity at the origin of the left main renal artery favored to be artifact given the other parameters are within normal limits. 2. 1.2 cm hypoechoic structure of the upper pole of the right kidney can not be definitively characterized as a simple cyst. Further evaluation with renal protocol MRI should be considerable patient condition allows the patient perform breath holds. Electronically Signed   By: Acquanetta Belling M.D.   On: 09/17/2023 09:19      Medications:     amLODipine  10 mg Oral QPM   aspirin EC  81 mg Oral Daily   atorvastatin  40 mg Oral Daily   calcitRIOL  0.25 mcg Oral Q T,Th,Sa-HD   calcium  carbonate  400 mg of elemental calcium Oral BID   Chlorhexidine Gluconate Cloth  6 each Topical Q0600   cinacalcet  30 mg Oral Q supper   epoetin alfa  10,000 Units Intravenous Q T,Th,Sa-HD   fluticasone  1 spray Each Nare Daily   gabapentin  200 mg Oral BID   heparin  5,000 Units Subcutaneous Q12H   hydrALAZINE  75 mg Oral Q8H   hydrOXYzine  25 mg Oral TID   irbesartan  300 mg Oral QPM   isosorbide mononitrate  120 mg Oral Daily   labetalol  600 mg Oral BID   pantoprazole  40 mg Oral Daily   polyethylene glycol  17 g Oral Daily   senna-docusate  2 tablet Oral BID   sevelamer carbonate  1,600 mg Oral TID with meals   terazosin  2 mg Oral BID   guaiFENesin-dextromethorphan, hydrALAZINE, melatonin, ondansetron (ZOFRAN) IV, oxyCODONE  Assessment/ Plan:  [name] is a 53 y.o.  female  with past medical conditions including hypertension, CHF, anemia, and end-stage renal disease on hemodialysis, who was admitted to [facility] on 08/09/2023 for Hypertensive urgency [I16.0] Chest pain [R07.9] Nonspecific chest pain [R07.9] Anemia due to chronic kidney disease, on chronic dialysis (HCC) [N18.6, D63.1, Z99.2]  Outpatient dialysis clinic was arranged at [facility] on a TTS schedule, chair time at 530 am.  Due to extended admission, outpatient assignment will need to be confirmed and possibly resubmitted prior to discharge.   1.  Hypertensive urgency, on admission   Currently prescribed amlodipine, hydralazine, irbesartan, isosorbide, and labetalol. Blood pressure 132/91 during dialysis.   2. Anemia of chronic kidney disease Hemoglobin & Hematocrit     Component Value Date/Time   HGB 9.0 (L) 09/17/2023 0730   HCT 27.8 (L) 09/17/2023 0730  Hemoglobin borderline, 9.0. Continue EPO with  dialysis treatment.   3.  End stage renal disease on hemodialysis.  Receiving dialysis today, UF 2L as tolerated. No complaints to offer. Next treatment scheduled for Saturday.   4. Secondary  Hyperparathyroidism:   Lab Results  Component Value Date   PTH 1,248 (H) 08/11/2023   CALCIUM 8.4 (L) 09/17/2023   PHOS 5.1 (H) 09/17/2023   Currently prescribed calcium, calcitriol, carbonate, sevelamer and Cinacalcet.    LOS: 0 Allison Hill 4/17/202510:38 AM

## 2023-09-18 DIAGNOSIS — R079 Chest pain, unspecified: Secondary | ICD-10-CM | POA: Diagnosis not present

## 2023-09-18 DIAGNOSIS — I132 Hypertensive heart and chronic kidney disease with heart failure and with stage 5 chronic kidney disease, or end stage renal disease: Secondary | ICD-10-CM | POA: Diagnosis not present
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
## 2023-09-18 NOTE — Plan of Care (Signed)
  Problem: Activity: Goal: Ability to tolerate increased activity will improve Outcome: Progressing   Problem: Health Behavior/Discharge Planning: Goal: Ability to manage health-related needs will improve Outcome: Progressing   Problem: Coping: Goal: Level of anxiety will decrease Outcome: Progressing   Problem: Elimination: Goal: Will not experience complications related to bowel motility Outcome: Progressing

## 2023-09-18 NOTE — TOC Progression Note (Signed)
 Transition of Care [facility]) - Progression Note    Patient Details  Name: Allison Hill MRN: [id number] Date of Birth: Apr 20, 1971  Transition of [facility]) CM/SW Contact  Crayton Docker, RN Phone Number: 09/18/2023, 12:11 PM  Clinical Narrative:     CM call to patient's daughter, Darice Edelman, regarding discharge care planning. Per patient's daughter, patient is not safe at patient's daughter's home and per patient's daughter patient cannot return to patient's daughter's home. "Its a no go for my mom to come here.A long term care facility is the best option for my mom because she really cannot take care of herself." CM faxed updated clinicals to area SNFs. CM follow up call placed to [facility], Admissions, [facility] and Rehab, noted accepted bed offer on 09/03/2023. CM spoke to [facility]. Per Raynelle Callow, will inform Willard Harman, when he returns to the office to review for female bed.   Expected Discharge Plan:  (TBD) Barriers to Discharge: Continued Medical Work up  Expected Discharge Plan and Services     Post Acute Care Choice:  (TBD) Living arrangements for the past 2 months: Single Family Home       Social Determinants of Health (SDOH) Interventions SDOH Screenings   Food Insecurity: No Food Insecurity (08/10/2023)  Recent Concern: Food Insecurity - High Risk (07/17/2023)   Received from [facility]  Housing: Low Risk  (08/10/2023)  Recent Concern: Housing - High Risk (07/22/2023)   Received from [facility]  Transportation Needs: Unmet Transportation Needs (08/10/2023)  Utilities: Not At Risk (08/10/2023)  Recent Concern: Utilities - Medium Risk (07/17/2023)   Received from [facility]  Financial Resource Strain: Patient Declined (01/03/2023)   Received from [facility]  Social Connections: Unknown (08/10/2023)  Tobacco Use: High Risk (08/10/2023)    Readmission Risk Interventions     No data to display

## 2023-09-18 NOTE — Progress Notes (Signed)
 Consulted by department director for questions of housing insecurity. Introduced patient to role of statistician. Intake questions completed.   Patient states she is originally from [place], [place]. She has been living with her daughter (who works for [employer] as a holiday representative at a [employer]) here in [place], but states her daughter does not feel she is safe because of the stairs. Patient states she tends to go from here to there and doesn't really have a stable housing environment. Patient states she is on the waiting list for the housing authority at the following locations: [address], [place], [place], and [place].   Patient receives SSDI and a partial SSI check, both of which add up to around $900/mo.   Patient is NOT interested in a group home or assisted living type situation. She is agreeable to SNF for short term rehab if needed.

## 2023-09-19 DIAGNOSIS — I132 Hypertensive heart and chronic kidney disease with heart failure and with stage 5 chronic kidney disease, or end stage renal disease: Secondary | ICD-10-CM | POA: Diagnosis not present

## 2023-09-19 LAB — RENAL FUNCTION PANEL
Albumin: 3.5 g/dL (ref 3.5–5.0)
Anion gap: 16 — ABNORMAL HIGH (ref 5–15)
BUN: 96 mg/dL — ABNORMAL HIGH (ref 6–20)
CO2: 23 mmol/L (ref 22–32)
Calcium: 8.6 mg/dL — ABNORMAL LOW (ref 8.9–10.3)
Chloride: 95 mmol/L — ABNORMAL LOW (ref 98–111)
Creatinine, Ser: 7.95 mg/dL — ABNORMAL HIGH (ref 0.44–1.00)
GFR, Estimated: 6 mL/min — ABNORMAL LOW (ref >60)
Glucose, Bld: 95 mg/dL (ref 70–99)
Phosphorus: 5 mg/dL — ABNORMAL HIGH (ref 2.5–4.6)
Potassium: 4.8 mmol/L (ref 3.5–5.1)
Sodium: 134 mmol/L — ABNORMAL LOW (ref 135–145)

## 2023-09-19 LAB — CBC
HCT: 26.8 % — ABNORMAL LOW (ref 36.0–46.0)
Hemoglobin: 8.7 g/dL — ABNORMAL LOW (ref 12.0–15.0)
MCH: 27.7 pg (ref 26.0–34.0)
MCHC: 32.5 g/dL (ref 30.0–36.0)
MCV: 85.4 fL (ref 80.0–100.0)
Platelets: 225 10*3/uL (ref 150–400)
RBC: 3.14 MIL/uL — ABNORMAL LOW (ref 3.87–5.11)
RDW: 17.7 % — ABNORMAL HIGH (ref 11.5–15.5)
WBC: 5.2 10*3/uL (ref 4.0–10.5)
nRBC: 0 % (ref 0.0–0.2)

## 2023-09-19 MED ORDER — ALTEPLASE 2 MG IJ SOLR: 2.0000 mg | Freq: Once | INTRAMUSCULAR | Status: DC | PRN

## 2023-09-19 MED ORDER — OXYCODONE HCL 5 MG PO TABS
ORAL_TABLET | ORAL | Status: AC
Start: 2023-09-19 — End: ?
  Filled 2023-09-19: qty 1

## 2023-09-19 MED ORDER — HEPARIN SODIUM (PORCINE) 1000 UNIT/ML DIALYSIS
1000.0000 [IU] | INTRAMUSCULAR | Status: DC | PRN
  Administered 2023-09-19: 1000 [IU]
  Filled 2023-09-19 (×2): qty 1

## 2023-09-19 NOTE — Progress Notes (Signed)
 [facility]  ROUNDING NOTE   Subjective:   Patient resting quietly in bed  Will receive dialysis later today  Objective:  Vital signs in last 24 hours:  Temp:  [98.1 F (36.7 C)-99.4 F (37.4 C)] 99.4 F (37.4 C) (04/19 0854) Pulse Rate:  [79-80] 79 (04/19 0854) Resp:  [18] 18 (04/19 0854) BP: (141-165)/(90-100) 141/92 (04/19 0854) SpO2:  [97 %-100 %] 100 % (04/19 0854) Weight:  [69.4 kg] 69.4 kg (04/19 0500)  Weight change: 0.7 kg Filed Weights   09/17/23 0834 09/18/23 0500 09/19/23 0500  Weight: 68.7 kg 70.9 kg 69.4 kg    Intake/Output: I/O last 3 completed shifts: In: 240 [P.O.:240] Out: -    Intake/Output this shift:  No intake/output data recorded.  Physical Exam: General: NAD  Head: Normocephalic, atraumatic. Moist oral mucosal membranes  Eyes: Anicteric  Lungs:  Clear to auscultation  Heart: Regular rate and rhythm  Abdomen:  Soft, nontender  Extremities: No peripheral edema.  Neurologic: Alert, moving all four extremities  Skin: No lesions  Access: Rt Chest Permcath    Basic Metabolic Panel: Recent Labs  Lab 09/14/23 0538 09/15/23 0418 09/16/23 0305 09/17/23 0730  NA 139  139 139 134* 138  K 4.5  4.5 5.6* 4.0 4.8  CL 101  102 103 99 103  CO2 23  24 23 25 25   GLUCOSE 99  101* 89 150* 94  BUN 88*  88* 103* 60* 88*  CREATININE 7.20*  7.14* 8.13* 5.19* 7.50*  CALCIUM  8.5*  8.6* 8.2* 8.4* 8.4*  PHOS 4.5  --   --  5.1*    Liver Function Tests: Recent Labs  Lab 09/14/23 0538 09/17/23 0730  ALBUMIN 3.5 3.3*    No results for input(s): "LIPASE", "AMYLASE" in the last 168 hours. No results for input(s): "AMMONIA" in the last 168 hours.  CBC: Recent Labs  Lab 09/14/23 0538 09/15/23 0418 09/16/23 0305 09/17/23 0730  WBC 5.5 5.4 5.1 4.5  HGB 9.3* 8.5* 9.2* 9.0*  HCT 28.7* 25.5* 27.5* 27.8*  MCV 85.2 84.2 84.1 85.3  PLT 214 220 238 231    Cardiac Enzymes: No results for input(s): "CKTOTAL", "CKMB", "CKMBINDEX",  "TROPONINI" in the last 168 hours.  BNP: Invalid input(s): "POCBNP"  CBG: No results for input(s): "GLUCAP" in the last 168 hours.   Microbiology: Results for orders placed or performed during the hospital encounter of 08/09/23  Resp panel by RT-PCR (RSV, Flu A&B, Covid) Anterior Nasal Swab     Status: None   Collection Time: 08/14/23  3:44 AM   Specimen: Anterior Nasal Swab  Result Value Ref Range Status   SARS Coronavirus 2 by RT PCR NEGATIVE NEGATIVE Final    Comment: (NOTE) SARS-CoV-2 target nucleic acids are NOT DETECTED.  The SARS-CoV-2 RNA is generally detectable in upper respiratory specimens during the acute phase of infection. The lowest concentration of SARS-CoV-2 viral copies this assay can detect is 138 copies/mL. A negative result does not preclude SARS-Cov-2 infection and should not be used as the sole basis for treatment or other patient management decisions. A negative result may occur with  improper specimen collection/handling, submission of specimen other than nasopharyngeal swab, presence of viral mutation(s) within the areas targeted by this assay, and inadequate number of viral copies(<138 copies/mL). A negative result must be combined with clinical observations, patient history, and epidemiological information. The expected result is Negative.  Fact Sheet for Patients:  BloggerCourse.com  Fact Sheet for Healthcare Providers:  SeriousBroker.it  This  test is no t yet approved or cleared by the [place]  FDA and  has been authorized for detection and/or diagnosis of SARS-CoV-2 by FDA under an Emergency Use Authorization (EUA). This EUA will remain  in effect (meaning this test can be used) for the duration of the COVID-19 declaration under Section 564(b)(1) of the Act, 21 U.S.C.section 360bbb-3(b)(1), unless the authorization is terminated  or revoked sooner.       Influenza A by PCR NEGATIVE  NEGATIVE Final   Influenza B by PCR NEGATIVE NEGATIVE Final    Comment: (NOTE) The Xpert Xpress SARS-CoV-2/FLU/RSV plus assay is intended as an aid in the diagnosis of influenza from Nasopharyngeal swab specimens and should not be used as a sole basis for treatment. Nasal washings and aspirates are unacceptable for Xpert Xpress SARS-CoV-2/FLU/RSV testing.  Fact Sheet for Patients: BloggerCourse.com  Fact Sheet for Healthcare Providers: SeriousBroker.it  This test is not yet approved or cleared by the [place]  FDA and has been authorized for detection and/or diagnosis of SARS-CoV-2 by FDA under an Emergency Use Authorization (EUA). This EUA will remain in effect (meaning this test can be used) for the duration of the COVID-19 declaration under Section 564(b)(1) of the Act, 21 U.S.C. section 360bbb-3(b)(1), unless the authorization is terminated or revoked.     Resp Syncytial Virus by PCR NEGATIVE NEGATIVE Final    Comment: (NOTE) Fact Sheet for Patients: BloggerCourse.com  Fact Sheet for Healthcare Providers: SeriousBroker.it  This test is not yet approved or cleared by the [place]  FDA and has been authorized for detection and/or diagnosis of SARS-CoV-2 by FDA under an Emergency Use Authorization (EUA). This EUA will remain in effect (meaning this test can be used) for the duration of the COVID-19 declaration under Section 564(b)(1) of the Act, 21 U.S.C. section 360bbb-3(b)(1), unless the authorization is terminated or revoked.  Performed at [facility], [address] [place], [place] [address]   Respiratory (~20 pathogens) panel by PCR     Status: None   Collection Time: 08/14/23  3:44 AM   Specimen: Nasopharyngeal Swab; Respiratory  Result Value Ref Range Status   Adenovirus NOT DETECTED NOT DETECTED Final   Coronavirus 229E NOT DETECTED NOT  DETECTED Final    Comment: (NOTE) The Coronavirus on the Respiratory Panel, DOES NOT test for the novel  Coronavirus (2019 nCoV)    Coronavirus HKU1 NOT DETECTED NOT DETECTED Final   Coronavirus NL63 NOT DETECTED NOT DETECTED Final   Coronavirus OC43 NOT DETECTED NOT DETECTED Final   Metapneumovirus NOT DETECTED NOT DETECTED Final   Rhinovirus / Enterovirus NOT DETECTED NOT DETECTED Final   Influenza A NOT DETECTED NOT DETECTED Final   Influenza B NOT DETECTED NOT DETECTED Final   Parainfluenza Virus 1 NOT DETECTED NOT DETECTED Final   Parainfluenza Virus 2 NOT DETECTED NOT DETECTED Final   Parainfluenza Virus 3 NOT DETECTED NOT DETECTED Final   Parainfluenza Virus 4 NOT DETECTED NOT DETECTED Final   Respiratory Syncytial Virus NOT DETECTED NOT DETECTED Final   Bordetella pertussis NOT DETECTED NOT DETECTED Final   Bordetella Parapertussis NOT DETECTED NOT DETECTED Final   Chlamydophila pneumoniae NOT DETECTED NOT DETECTED Final   Mycoplasma pneumoniae NOT DETECTED NOT DETECTED Final    Comment: Performed at [facility], [address] [place], [place] [address]  Culture, blood (Routine X 2) w Reflex to ID Panel     Status: None   Collection Time: 08/14/23  9:19 AM   Specimen: BLOOD  Result Value Ref Range Status   Specimen Description BLOOD BLOOD RIGHT HAND  Final   Special Requests   Final    BOTTLES DRAWN AEROBIC AND ANAEROBIC Blood Culture adequate volume   Culture   Final    NO GROWTH 5 DAYS Performed at [facility], [address] [place], [place] [address]    Report Status 08/19/2023 FINAL  Final  Culture, blood (Routine X 2) w Reflex to ID Panel     Status: None   Collection Time: 08/14/23  9:19 AM   Specimen: BLOOD  Result Value Ref Range Status   Specimen Description BLOOD BLOOD LEFT HAND  Final   Special Requests   Final    BOTTLES DRAWN AEROBIC AND ANAEROBIC Blood Culture adequate volume   Culture   Final    NO GROWTH 5 DAYS Performed at  [facility] [address] [place], [place] [address]    Report Status 08/19/2023 FINAL  Final  Ear culture     Status: None   Collection Time: 08/14/23 10:11 AM   Specimen: Ear; Other  Result Value Ref Range Status   Specimen Description   Final    EAR Performed at [facility], [address], [place], [place] [address]    Special Requests   Final    RIGHT EAR Performed at [facility], [address] [place], [place] [address]    Culture RARE STREPTOCOCCUS PNEUMONIAE  Final   Report Status 08/16/2023 FINAL  Final   Organism ID, Bacteria STREPTOCOCCUS PNEUMONIAE  Final      Susceptibility   Streptococcus pneumoniae - MIC*    ERYTHROMYCIN <=0.12 SENSITIVE Sensitive     LEVOFLOXACIN  1 SENSITIVE Sensitive     VANCOMYCIN 0.5 SENSITIVE Sensitive     PENICILLIN (meningitis) <=0.06 SENSITIVE Sensitive     PENO - penicillin <=0.06      PENICILLIN (non-meningitis) <=0.06 SENSITIVE Sensitive     PENICILLIN (oral) <=0.06 SENSITIVE Sensitive     CEFTRIAXONE (non-meningitis) <=0.12 SENSITIVE Sensitive     CEFTRIAXONE (meningitis) <=0.12 SENSITIVE Sensitive     * RARE STREPTOCOCCUS PNEUMONIAE    Coagulation Studies: No results for input(s): "LABPROT", "INR" in the last 72 hours.  Urinalysis: No results for input(s): "COLORURINE", "LABSPEC", "PHURINE", "GLUCOSEU", "HGBUR", "BILIRUBINUR", "KETONESUR", "PROTEINUR", "UROBILINOGEN", "NITRITE", "LEUKOCYTESUR" in the last 72 hours.  Invalid input(s): "APPERANCEUR"    Imaging: No results found.     Medications:     amLODipine   10 mg Oral QPM   aspirin  EC  81 mg Oral Daily   atorvastatin   40 mg Oral Daily   calcitRIOL   0.25 mcg Oral Q T,Th,Sa-HD   calcium  carbonate  400 mg of elemental calcium  Oral BID   Chlorhexidine  Gluconate Cloth  6 each Topical Q0600   cinacalcet   30 mg Oral Q supper   epoetin  alfa  10,000 Units Intravenous Q T,Th,Sa-HD   fluticasone   1 spray Each Nare Daily   gabapentin    200 mg Oral BID   heparin   5,000 Units Subcutaneous Q12H   hydrALAZINE   75 mg Oral Q8H   hydrOXYzine   25 mg Oral TID   irbesartan   300 mg Oral QPM   isosorbide  mononitrate  120 mg Oral Daily   labetalol   600 mg Oral BID   pantoprazole   40 mg Oral Daily   polyethylene glycol  17 g Oral Daily   senna-docusate  2 tablet Oral BID   sevelamer  carbonate  1,600 mg Oral TID with meals  terazosin   2 mg Oral BID   alteplase , guaiFENesin -dextromethorphan, heparin , hydrALAZINE , melatonin, ondansetron  (ZOFRAN ) IV, oxyCODONE   Assessment/ Plan:  [name] is a 53 y.o.  female  with past medical conditions including hypertension, CHF, anemia, and end-stage renal disease on hemodialysis, who was admitted to [facility] on 08/09/2023 for Hypertensive urgency [I16.0] Chest pain [R07.9] Nonspecific chest pain [R07.9] Anemia due to chronic kidney disease, on chronic dialysis (HCC) [N18.6, D63.1, Z99.2]  Outpatient dialysis clinic was arranged at [facility] on a TTS schedule, chair time at 530 am.  Due to extended admission, outpatient assignment will need to be confirmed and possibly resubmitted prior to discharge.   1.  Hypertensive urgency, on admission   Currently prescribed amlodipine , hydralazine , irbesartan , isosorbide , and labetalol .   2. Anemia of chronic kidney disease Hemoglobin & Hematocrit     Component Value Date/Time   HGB 9.0 (L) 09/17/2023 0730   HCT 27.8 (L) 09/17/2023 0730   Continue EPO with  dialysis treatment.   3.  End stage renal disease on hemodialysis.  Will receive dialysis later today.    4. Secondary Hyperparathyroidism:   Lab Results  Component Value Date   PTH 1,248 (H) 08/11/2023   CALCIUM  8.4 (L) 09/17/2023   PHOS 5.1 (H) 09/17/2023   Currently prescribed calcium , calcitriol , carbonate, sevelamer  and Cinacalcet .    LOS: 0 Relda Agosto 4/19/20251:20 PM

## 2023-09-19 NOTE — Plan of Care (Signed)
  Problem: Education: Goal: Understanding of cardiac disease, CV risk reduction, and recovery process will improve Outcome: Progressing   Problem: Activity: Goal: Ability to tolerate increased activity will improve Outcome: Progressing   Problem: Cardiac: Goal: Ability to achieve and maintain adequate cardiovascular perfusion will improve Outcome: Progressing   Problem: Health Behavior/Discharge Planning: Goal: Ability to safely manage health-related needs after discharge will improve Outcome: Progressing   Problem: Education: Goal: Knowledge of General Education information will improve Description: Including pain rating scale, medication(s)/side effects and non-pharmacologic comfort measures Outcome: Progressing   Problem: Health Behavior/Discharge Planning: Goal: Ability to manage health-related needs will improve Outcome: Progressing   Problem: Clinical Measurements: Goal: Ability to maintain clinical measurements within normal limits will improve Outcome: Progressing Goal: Will remain free from infection Outcome: Progressing Goal: Diagnostic test results will improve Outcome: Progressing Goal: Respiratory complications will improve Outcome: Progressing Goal: Cardiovascular complication will be avoided Outcome: Progressing   Problem: Activity: Goal: Risk for activity intolerance will decrease Outcome: Progressing   Problem: Nutrition: Goal: Adequate nutrition will be maintained Outcome: Progressing   Problem: Coping: Goal: Level of anxiety will decrease Outcome: Progressing   Problem: Elimination: Goal: Will not experience complications related to bowel motility Outcome: Progressing Goal: Will not experience complications related to urinary retention Outcome: Progressing   Problem: Education: Goal: Knowledge of disease or condition will improve Outcome: Progressing Goal: Knowledge of secondary prevention will improve (MUST DOCUMENT ALL) Outcome:  Progressing Goal: Knowledge of patient specific risk factors will improve (DELETE if not current risk factor) Outcome: Progressing   Problem: Skin Integrity: Goal: Risk for impaired skin integrity will decrease Outcome: Progressing   Problem: Ischemic Stroke/TIA Tissue Perfusion: Goal: Complications of ischemic stroke/TIA will be minimized Outcome: Progressing   Problem: Self-Care: Goal: Ability to participate in self-care as condition permits will improve Outcome: Progressing Goal: Verbalization of feelings and concerns over difficulty with self-care will improve Outcome: Progressing Goal: Ability to communicate needs accurately will improve Outcome: Progressing

## 2023-09-19 NOTE — Progress Notes (Signed)
 Hemodialysis note  Received patient in bed to unit. Alert and oriented.  Informed consent signed and in chart.  Tx duration: 3 hours  Patient tolerated well. Transported back to room, alert without acute distress.  Report given to patient's RN.   Access used: Right Chest HD catheter Access issues: High arterial pressure. Lines were reversed.   Total UF removed: 2L Medication(s) given:  Retacrit  10 000 units IV, Oxycodone  5 mg tab PO  Post HD weight: 67.2 kg   Jackye Masson [facility]
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
## 2023-09-19 NOTE — Progress Notes (Signed)
 Pt bed alarm going off. Upon arrival, pt I'ly ambulating back from BR without AD and very unsteady. Severe LOB 2 x. Encouraged use of call bell. Required assistance to prevent falling with returning to bed.

## 2023-09-19 NOTE — Plan of Care (Signed)
  Problem: Education: Goal: Understanding of cardiac disease, CV risk reduction, and recovery process will improve Outcome: Progressing   Problem: Activity: Goal: Ability to tolerate increased activity will improve Outcome: Progressing   Problem: Cardiac: Goal: Ability to achieve and maintain adequate cardiovascular perfusion will improve Outcome: Progressing   Problem: Health Behavior/Discharge Planning: Goal: Ability to safely manage health-related needs after discharge will improve Outcome: Progressing   Problem: Education: Goal: Knowledge of General Education information will improve Description: Including pain rating scale, medication(s)/side effects and non-pharmacologic comfort measures Outcome: Progressing   Problem: Health Behavior/Discharge Planning: Goal: Ability to manage health-related needs will improve Outcome: Progressing   Problem: Clinical Measurements: Goal: Ability to maintain clinical measurements within normal limits will improve Outcome: Progressing Goal: Will remain free from infection Outcome: Progressing Goal: Diagnostic test results will improve Outcome: Progressing Goal: Respiratory complications will improve Outcome: Progressing Goal: Cardiovascular complication will be avoided Outcome: Progressing   Problem: Activity: Goal: Risk for activity intolerance will decrease Outcome: Progressing   Problem: Nutrition: Goal: Adequate nutrition will be maintained Outcome: Progressing   Problem: Coping: Goal: Level of anxiety will decrease Outcome: Progressing   Problem: Elimination: Goal: Will not experience complications related to bowel motility Outcome: Progressing Goal: Will not experience complications related to urinary retention Outcome: Progressing   Problem: Pain Managment: Goal: General experience of comfort will improve and/or be controlled Outcome: Progressing   Problem: Safety: Goal: Ability to remain free from injury will  improve Outcome: Progressing   Problem: Skin Integrity: Goal: Risk for impaired skin integrity will decrease Outcome: Progressing   Problem: Education: Goal: Knowledge of disease or condition will improve Outcome: Progressing Goal: Knowledge of secondary prevention will improve (MUST DOCUMENT ALL) Outcome: Progressing Goal: Knowledge of patient specific risk factors will improve (DELETE if not current risk factor) Outcome: Progressing   Problem: Ischemic Stroke/TIA Tissue Perfusion: Goal: Complications of ischemic stroke/TIA will be minimized Outcome: Progressing   Problem: Coping: Goal: Will verbalize positive feelings about self Outcome: Progressing Goal: Will identify appropriate support needs Outcome: Progressing   Problem: Health Behavior/Discharge Planning: Goal: Ability to manage health-related needs will improve Outcome: Progressing Goal: Goals will be collaboratively established with patient/family Outcome: Progressing   Problem: Self-Care: Goal: Ability to participate in self-care as condition permits will improve Outcome: Progressing Goal: Verbalization of feelings and concerns over difficulty with self-care will improve Outcome: Progressing Goal: Ability to communicate needs accurately will improve Outcome: Progressing   Problem: Nutrition: Goal: Risk of aspiration will decrease Outcome: Progressing Goal: Dietary intake will improve Outcome: Progressing

## 2023-09-20 DIAGNOSIS — I132 Hypertensive heart and chronic kidney disease with heart failure and with stage 5 chronic kidney disease, or end stage renal disease: Secondary | ICD-10-CM | POA: Diagnosis not present

## 2023-09-20 NOTE — Plan of Care (Signed)
  Problem: Activity: Goal: Ability to tolerate increased activity will improve Outcome: Progressing   Problem: Clinical Measurements: Goal: Will remain free from infection Outcome: Progressing   Problem: Elimination: Goal: Will not experience complications related to bowel motility Outcome: Progressing Goal: Will not experience complications related to urinary retention Outcome: Progressing   Problem: Coping: Goal: Level of anxiety will decrease Outcome: Progressing

## 2023-09-20 NOTE — Evaluation (Addendum)
 Physical Therapy Evaluation Patient Details Name: Allison Hill MRN: [id number] DOB: [birth year] Today's Date: 09/20/2023  History of Present Illness  Pt is a 53 y.o. female with medical history significant of ESRD on HD MWF, refractory HTN, chronic HFpEF, chronic iron deficiency anemia presented with new onset of chest pain, workup for hypertensive emergency.  Since admission, experienced code stroke (4/11); head CT, MRI negative.  However, noted with persistent balance/mobility deficits with several 'near miss' falls in recent days.  Clinical Impression  Patient up in recliner upon arrival to room; alert and oriented, follows commands and agreeable to participation with session.  Slightly impulsive with noted deficits in insight, safety awareness; redirectable with min/mod cuing.  Denies pain.  Patient noted with mild functional weakness and generalized sensory deficits throughout L hemi-body (consistent with patient's report of previous r-sided strokes); denies acute change in L UE/LE. Currently requiring cga/min assist for sit/stand, standing balance, gait (120') with 4WRW; min assist for functional balance activities (BERG 15/56) requiring UE support or +1 for optimal safety.  Gait pattern significant for broad BOS with choppy stepping pattern; excessive L LE ER; min cuing for walker position/management. Noted delay in balance reactions; high fall risk as result. Reinforced role of 4WRW with all mobility; patient agreeable. Would benefit from skilled PT to address above deficits and promote optimal return to PLOF.; recommend post-acute PT follow up as indicated by interdisciplinary care team.          If plan is discharge home, recommend the following: A little help with walking and/or transfers;A little help with bathing/dressing/bathroom;Assistance with cooking/housework;Assist for transportation;Help with stairs or ramp for entrance   Can travel by private vehicle   Yes    Equipment  Recommendations    Recommendations for Other Services       Functional Status Assessment Patient has had a recent decline in their functional status and demonstrates the ability to make significant improvements in function in a reasonable and predictable amount of time.     Precautions / Restrictions Precautions Precautions: Fall Recall of Precautions/Restrictions: Impaired Restrictions Weight Bearing Restrictions Per Provider Order: No      Mobility  Bed Mobility               General bed mobility comments: seated in recliner beginning/end of treatment session    Transfers Overall transfer level: Needs assistance Equipment used: Rollator (4 wheels) Transfers: Sit to/from Stand Sit to Stand: Contact guard assist           General transfer comment: does require UE support for lift off and stabilization    Ambulation/Gait Ambulation/Gait assistance: Contact guard assist, Min assist Gait Distance (Feet): 120 Feet Assistive device: Rollator (4 wheels)         General Gait Details: broad BOS with choppy stepping pattern; excessive L LE ER; min cuing for walker position/management.  Noted delay in balance reactions; high fall risk as result  Stairs            Wheelchair Mobility     Tilt Bed    Modified Rankin (Stroke Patients Only)       Balance Overall balance assessment: Needs assistance Sitting-balance support: Feet supported, No upper extremity supported Sitting balance-Leahy Scale: Good     Standing balance support: Bilateral upper extremity supported Standing balance-Leahy Scale: Fair                   Standardized Balance Assessment Standardized Balance Assessment : Programmer, systems Test [facility] Balance Test Sit  to Stand: Able to stand  independently using hands Standing Unsupported: Needs several tries to stand 30 seconds unsupported Sitting with Back Unsupported but Feet Supported on Floor or Stool: Able to sit safely and securely  2 minutes Stand to Sit: Sits independently, has uncontrolled descent Transfers: Needs one person to assist Standing Unsupported with Eyes Closed: Unable to keep eyes closed 3 seconds but stays steady Standing Ubsupported with Feet Together: Needs help to attain position and unable to hold for 15 seconds From Standing, Reach Forward with Outstretched Arm: Reaches forward but needs supervision From Standing Position, Pick up Object from Floor: Unable to pick up and needs supervision From Standing Position, Turn to Look Behind Over each Shoulder: Turn sideways only but maintains balance Turn 360 Degrees: Needs assistance while turning Standing Unsupported, Alternately Place Feet on Step/Stool: Needs assistance to keep from falling or unable to try Standing Unsupported, One Foot in Front: Loses balance while stepping or standing Standing on One Leg: Unable to try or needs assist to prevent fall Total Score: 15         Pertinent Vitals/Pain Pain Assessment Pain Assessment: No/denies pain    Home Living Family/patient expects to be discharged to:: Private residence Living Arrangements: Children Available Help at Discharge: Family Type of Home: House Home Access: Stairs to enter Entrance Stairs-Rails: Right Entrance Stairs-Number of Steps: 3     Home Equipment: Rollator (4 wheels) Additional Comments: Per patient (and chart), unable to return home with daughter; interested in STR, but does voice disinterest in LTC arrangements    Prior Function Prior Level of Function : Needs assist             Mobility Comments: Sup/mod indep with 4WRW for household and limited community mobilization       Extremity/Trunk Assessment   Upper Extremity Assessment LUE Deficits / Details: AROM to approx 1/2 full ROM, elbow/wrist approx [employer]; endorses diminished sensory awareness throughout L UE    Lower Extremity Assessment Lower Extremity Assessment: LLE deficits/detail LLE Deficits /  Details: L LE grossly 4-/5 (somewhat inconsistent and 'give way' at times); endorses diminished sensory awareness throughout L UE       Communication   Communication Communication: Impaired Factors Affecting Communication: Difficulty expressing self (speech intermittently garbled, but corrects with increased time/effort)    Cognition Arousal: Alert Behavior During Therapy: WFL for tasks assessed/performed   PT - Cognitive impairments: No family/caregiver present to determine baseline                         Following commands: Impaired Following commands impaired: Follows one step commands with increased time     Cueing Cueing Techniques: Verbal cues, Tactile cues     General Comments      Exercises     Assessment/Plan    PT Assessment Patient needs continued PT services  PT Problem List Decreased strength;Decreased activity tolerance;Decreased balance;Decreased mobility;Decreased safety awareness;Decreased knowledge of use of DME       PT Treatment Interventions DME instruction;Balance training;Gait training;Neuromuscular re-education;Stair training;Functional mobility training;Patient/family education;Therapeutic activities;Therapeutic exercise    PT Goals (Current goals can be found in the Care Plan section)  Acute Rehab PT Goals Patient Stated Goal: to feel better PT Goal Formulation: With patient Time For Goal Achievement: 10/04/23 Potential to Achieve Goals: Fair Additional Goals Additional Goal #1: Improve BERG by 5-7 points for increased safety/decreased fall risk with functional mobility    Frequency Min 1X/week  Co-evaluation               AM-PAC PT "6 Clicks" Mobility  Outcome Measure Help needed turning from your back to your side while in a flat bed without using bedrails?: None Help needed moving from lying on your back to sitting on the side of a flat bed without using bedrails?: A Little Help needed moving to and from a bed to  a chair (including a wheelchair)?: A Little Help needed standing up from a chair using your arms (e.g., wheelchair or bedside chair)?: A Little Help needed to walk in hospital room?: A Little Help needed climbing 3-5 steps with a railing? : A Little 6 Click Score: 19    End of Session   Activity Tolerance: Patient tolerated treatment well Patient left: in chair;with call bell/phone within reach Nurse Communication: Mobility status PT Visit Diagnosis: Other abnormalities of gait and mobility (R26.89);Difficulty in walking, not elsewhere classified (R26.2);Muscle weakness (generalized) (M62.81)    Time: 1351-1420 PT Time Calculation (min) (ACUTE ONLY): 29 min   Charges:   PT Evaluation $PT Re-evaluation: 1 Re-eval   PT General Charges $$ ACUTE PT VISIT: 1 Visit       Chennel Olivos H. Bevin Bucks, PT, DPT, NCS 09/20/23, 2:34 PM [phone]
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
## 2023-09-21 DIAGNOSIS — R079 Chest pain, unspecified: Secondary | ICD-10-CM | POA: Diagnosis not present

## 2023-09-21 DIAGNOSIS — I132 Hypertensive heart and chronic kidney disease with heart failure and with stage 5 chronic kidney disease, or end stage renal disease: Secondary | ICD-10-CM | POA: Diagnosis not present

## 2023-09-21 MED ORDER — MORPHINE SULFATE (PF) 2 MG/ML IV SOLN
2.0000 mg | Freq: Once | INTRAVENOUS | Status: AC
  Administered 2023-09-21: 2 mg via INTRAVENOUS
  Filled 2023-09-21: qty 1

## 2023-09-21 NOTE — Progress Notes (Signed)
 Mobility Specialist - Progress Note   09/21/23 1600  Mobility  Activity Ambulated with assistance in room  Level of Assistance Contact guard assist, steadying assist  Assistive Device Front wheel walker  Distance Ambulated (ft) 20 ft  Activity Response Tolerated well  Mobility visit 1 Mobility     Pt ambulating from bathroom with RN upon arrival, utilizing RA. Pt agreeable to continue with activity. Ambulated just outside of room door when pt began c/o feeling dizziness. Pt returned to bed with alarm set, needs in reach.    Searcy Czech Mobility Specialist 09/21/23, 4:34 PM

## 2023-09-21 NOTE — Progress Notes (Signed)
 [facility]  ROUNDING NOTE   Subjective:   Patient resting comfortably in bed. No acute complaints. Due for dialysis treatment again tomorrow.  Objective:  Vital signs in last 24 hours:  Temp:  [97.9 F (36.6 C)-98.3 F (36.8 C)] 98.2 F (36.8 C) (04/21 0751) Pulse Rate:  [80-90] 80 (04/21 0751) Resp:  [16-18] 16 (04/21 0751) BP: (132-154)/(83-94) 132/83 (04/21 0751) SpO2:  [96 %-100 %] 100 % (04/21 0751) Weight:  [71.5 kg] 71.5 kg (04/21 0500)  Weight change: 2.5 kg Filed Weights   09/19/23 1707 09/20/23 0507 09/21/23 0500  Weight: 67.2 kg 69.9 kg 71.5 kg    Intake/Output: I/O last 3 completed shifts: In: 960 [P.O.:960] Out: -    Intake/Output this shift:  No intake/output data recorded.  Physical Exam: General: NAD  Head: Normocephalic, atraumatic. Moist oral mucosal membranes  Eyes: Anicteric  Lungs:  Clear to auscultation  Heart: Regular rate and rhythm  Abdomen:  Soft, nontender  Extremities: No peripheral edema.  Neurologic: Alert, moving all four extremities  Skin: No lesions  Access: Rt Chest Permcath    Basic Metabolic Panel: Recent Labs  Lab 09/15/23 0418 09/16/23 0305 09/17/23 0730 09/19/23 1352  NA 139 134* 138 134*  K 5.6* 4.0 4.8 4.8  CL 103 99 103 95*  CO2 23 25 25 23   GLUCOSE 89 150* 94 95  BUN 103* 60* 88* 96*  CREATININE 8.13* 5.19* 7.50* 7.95*  CALCIUM  8.2* 8.4* 8.4* 8.6*  PHOS  --   --  5.1* 5.0*    Liver Function Tests: Recent Labs  Lab 09/17/23 0730 09/19/23 1352  ALBUMIN 3.3* 3.5    No results for input(s): "LIPASE", "AMYLASE" in the last 168 hours. No results for input(s): "AMMONIA" in the last 168 hours.  CBC: Recent Labs  Lab 09/15/23 0418 09/16/23 0305 09/17/23 0730 09/19/23 1352  WBC 5.4 5.1 4.5 5.2  HGB 8.5* 9.2* 9.0* 8.7*  HCT 25.5* 27.5* 27.8* 26.8*  MCV 84.2 84.1 85.3 85.4  PLT 220 238 231 225    Cardiac Enzymes: No results for input(s): "CKTOTAL", "CKMB", "CKMBINDEX", "TROPONINI"  in the last 168 hours.  BNP: Invalid input(s): "POCBNP"  CBG: No results for input(s): "GLUCAP" in the last 168 hours.   Microbiology: Results for orders placed or performed during the hospital encounter of 08/09/23  Resp panel by RT-PCR (RSV, Flu A&B, Covid) Anterior Nasal Swab     Status: None   Collection Time: 08/14/23  3:44 AM   Specimen: Anterior Nasal Swab  Result Value Ref Range Status   SARS Coronavirus 2 by RT PCR NEGATIVE NEGATIVE Final    Comment: (NOTE) SARS-CoV-2 target nucleic acids are NOT DETECTED.  The SARS-CoV-2 RNA is generally detectable in upper respiratory specimens during the acute phase of infection. The lowest concentration of SARS-CoV-2 viral copies this assay can detect is 138 copies/mL. A negative result does not preclude SARS-Cov-2 infection and should not be used as the sole basis for treatment or other patient management decisions. A negative result may occur with  improper specimen collection/handling, submission of specimen other than nasopharyngeal swab, presence of viral mutation(s) within the areas targeted by this assay, and inadequate number of viral copies(<138 copies/mL). A negative result must be combined with clinical observations, patient history, and epidemiological information. The expected result is Negative.  Fact Sheet for Patients:  BloggerCourse.com  Fact Sheet for Healthcare Providers:  SeriousBroker.it  This test is no t yet approved or cleared by the [place]  FDA  and  has been authorized for detection and/or diagnosis of SARS-CoV-2 by FDA under an Emergency Use Authorization (EUA). This EUA will remain  in effect (meaning this test can be used) for the duration of the COVID-19 declaration under Section 564(b)(1) of the Act, 21 U.S.C.section 360bbb-3(b)(1), unless the authorization is terminated  or revoked sooner.       Influenza A by PCR NEGATIVE NEGATIVE Final    Influenza B by PCR NEGATIVE NEGATIVE Final    Comment: (NOTE) The Xpert Xpress SARS-CoV-2/FLU/RSV plus assay is intended as an aid in the diagnosis of influenza from Nasopharyngeal swab specimens and should not be used as a sole basis for treatment. Nasal washings and aspirates are unacceptable for Xpert Xpress SARS-CoV-2/FLU/RSV testing.  Fact Sheet for Patients: BloggerCourse.com  Fact Sheet for Healthcare Providers: SeriousBroker.it  This test is not yet approved or cleared by the [place]  FDA and has been authorized for detection and/or diagnosis of SARS-CoV-2 by FDA under an Emergency Use Authorization (EUA). This EUA will remain in effect (meaning this test can be used) for the duration of the COVID-19 declaration under Section 564(b)(1) of the Act, 21 U.S.C. section 360bbb-3(b)(1), unless the authorization is terminated or revoked.     Resp Syncytial Virus by PCR NEGATIVE NEGATIVE Final    Comment: (NOTE) Fact Sheet for Patients: BloggerCourse.com  Fact Sheet for Healthcare Providers: SeriousBroker.it  This test is not yet approved or cleared by the [place]  FDA and has been authorized for detection and/or diagnosis of SARS-CoV-2 by FDA under an Emergency Use Authorization (EUA). This EUA will remain in effect (meaning this test can be used) for the duration of the COVID-19 declaration under Section 564(b)(1) of the Act, 21 U.S.C. section 360bbb-3(b)(1), unless the authorization is terminated or revoked.  Performed at [facility], [address] [place], [place] [address]   Respiratory (~20 pathogens) panel by PCR     Status: None   Collection Time: 08/14/23  3:44 AM   Specimen: Nasopharyngeal Swab; Respiratory  Result Value Ref Range Status   Adenovirus NOT DETECTED NOT DETECTED Final   Coronavirus 229E NOT DETECTED NOT DETECTED Final     Comment: (NOTE) The Coronavirus on the Respiratory Panel, DOES NOT test for the novel  Coronavirus (2019 nCoV)    Coronavirus HKU1 NOT DETECTED NOT DETECTED Final   Coronavirus NL63 NOT DETECTED NOT DETECTED Final   Coronavirus OC43 NOT DETECTED NOT DETECTED Final   Metapneumovirus NOT DETECTED NOT DETECTED Final   Rhinovirus / Enterovirus NOT DETECTED NOT DETECTED Final   Influenza A NOT DETECTED NOT DETECTED Final   Influenza B NOT DETECTED NOT DETECTED Final   Parainfluenza Virus 1 NOT DETECTED NOT DETECTED Final   Parainfluenza Virus 2 NOT DETECTED NOT DETECTED Final   Parainfluenza Virus 3 NOT DETECTED NOT DETECTED Final   Parainfluenza Virus 4 NOT DETECTED NOT DETECTED Final   Respiratory Syncytial Virus NOT DETECTED NOT DETECTED Final   Bordetella pertussis NOT DETECTED NOT DETECTED Final   Bordetella Parapertussis NOT DETECTED NOT DETECTED Final   Chlamydophila pneumoniae NOT DETECTED NOT DETECTED Final   Mycoplasma pneumoniae NOT DETECTED NOT DETECTED Final    Comment: Performed at [facility], [address], [place], [place] [address]  Culture, blood (Routine X 2) w Reflex to ID Panel     Status: None   Collection Time: 08/14/23  9:19 AM   Specimen: BLOOD  Result Value Ref Range Status   Specimen Description BLOOD BLOOD RIGHT  HAND  Final   Special Requests   Final    BOTTLES DRAWN AEROBIC AND ANAEROBIC Blood Culture adequate volume   Culture   Final    NO GROWTH 5 DAYS Performed at [facility], [address] [place], [place] [address]    Report Status 08/19/2023 FINAL  Final  Culture, blood (Routine X 2) w Reflex to ID Panel     Status: None   Collection Time: 08/14/23  9:19 AM   Specimen: BLOOD  Result Value Ref Range Status   Specimen Description BLOOD BLOOD LEFT HAND  Final   Special Requests   Final    BOTTLES DRAWN AEROBIC AND ANAEROBIC Blood Culture adequate volume   Culture   Final    NO GROWTH 5 DAYS Performed at [facility],  [address] [place], [place] [address]    Report Status 08/19/2023 FINAL  Final  Ear culture     Status: None   Collection Time: 08/14/23 10:11 AM   Specimen: Ear; Other  Result Value Ref Range Status   Specimen Description   Final    EAR Performed at [facility], [address] [place], [place] [address]    Special Requests   Final    RIGHT EAR Performed at [facility], [address] [place], [place] [address]    Culture RARE STREPTOCOCCUS PNEUMONIAE  Final   Report Status 08/16/2023 FINAL  Final   Organism ID, Bacteria STREPTOCOCCUS PNEUMONIAE  Final      Susceptibility   Streptococcus pneumoniae - MIC*    ERYTHROMYCIN <=0.12 SENSITIVE Sensitive     LEVOFLOXACIN  1 SENSITIVE Sensitive     VANCOMYCIN 0.5 SENSITIVE Sensitive     PENICILLIN (meningitis) <=0.06 SENSITIVE Sensitive     PENO - penicillin <=0.06      PENICILLIN (non-meningitis) <=0.06 SENSITIVE Sensitive     PENICILLIN (oral) <=0.06 SENSITIVE Sensitive     CEFTRIAXONE (non-meningitis) <=0.12 SENSITIVE Sensitive     CEFTRIAXONE (meningitis) <=0.12 SENSITIVE Sensitive     * RARE STREPTOCOCCUS PNEUMONIAE    Coagulation Studies: No results for input(s): "LABPROT", "INR" in the last 72 hours.  Urinalysis: No results for input(s): "COLORURINE", "LABSPEC", "PHURINE", "GLUCOSEU", "HGBUR", "BILIRUBINUR", "KETONESUR", "PROTEINUR", "UROBILINOGEN", "NITRITE", "LEUKOCYTESUR" in the last 72 hours.  Invalid input(s): "APPERANCEUR"    Imaging: No results found.     Medications:     amLODipine   10 mg Oral QPM   aspirin  EC  81 mg Oral Daily   atorvastatin   40 mg Oral Daily   calcitRIOL   0.25 mcg Oral Q T,Th,Sa-HD   calcium  carbonate  400 mg of elemental calcium  Oral BID   Chlorhexidine  Gluconate Cloth  6 each Topical Q0600   cinacalcet   30 mg Oral Q supper   epoetin  alfa  10,000 Units Intravenous Q T,Th,Sa-HD   fluticasone   1 spray Each Nare Daily   gabapentin   200 mg Oral BID    heparin   5,000 Units Subcutaneous Q12H   hydrALAZINE   75 mg Oral Q8H   hydrOXYzine   25 mg Oral TID   irbesartan   300 mg Oral QPM   isosorbide  mononitrate  120 mg Oral Daily   labetalol   600 mg Oral BID   pantoprazole   40 mg Oral Daily   polyethylene glycol  17 g Oral Daily   senna-docusate  2 tablet Oral BID   sevelamer  carbonate  1,600 mg Oral TID with meals   terazosin   2 mg Oral BID   guaiFENesin -dextromethorphan, hydrALAZINE , melatonin,  ondansetron  (ZOFRAN ) IV, oxyCODONE   Assessment/ Plan:  [name] is a 53 y.o.  female  with past medical conditions including hypertension, CHF, anemia, and end-stage renal disease on hemodialysis, who was admitted to [facility] on 08/09/2023 for Hypertensive urgency [I16.0] Chest pain [R07.9] Nonspecific chest pain [R07.9] Anemia due to chronic kidney disease, on chronic dialysis (HCC) [N18.6, D63.1, Z99.2]  Outpatient dialysis clinic was arranged at [facility] on a TTS schedule, chair time at 530 am.  Due to extended admission, outpatient assignment will need to be confirmed and possibly resubmitted prior to discharge.    1.  End stage renal disease on hemodialysis.  We will plan for hemodialysis treatment again tomorrow for her usual TTS schedule.  2. Anemia of chronic kidney disease Hemoglobin & Hematocrit     Component Value Date/Time   HGB 8.7 (L) 09/19/2023 1352   HCT 26.8 (L) 09/19/2023 1352   Hemoglobin remains below target at 8.7.  Continue Epogen  for now.  3.  Hypertensive urgency, on admission   Blood pressure currently 132/83.  Maintain the patient on amlodipine , hydralazine , irbesartan , isosorbide , and labetalol .    4. Secondary Hyperparathyroidism:   Lab Results  Component Value Date   PTH 1,248 (H) 08/11/2023   CALCIUM  8.6 (L) 09/19/2023   PHOS 5.0 (H) 09/19/2023   Currently prescribed calcium  carbonate, calcitriol , Renvela , and Sensipar .    LOS: 0 Sabrinia Prien 4/21/20258:34 AM

## 2023-09-21 NOTE — TOC Progression Note (Addendum)
 Transition of Care [facility]) - Progression Note    Patient Details  Name: Allison Hill MRN: [id number] Date of Birth: May 14, 1971  [facility] CM/SW Contact  Crayton Docker, RN 09/21/2023, 12:44 PM  Clinical Narrative:     Call received from [facility], Admission, [facility], [place], [place]. Per Willard Harman, SNF has accepted patient for long term care for admission on Wednesday, 09/23/2023. CM alert to Dr. Authur Leghorn regarding SNF acceptance.    Call received from [facility], Admissions regarding short term rehab vs long term care. CM consulted Starling Eck, PT regarding rehab potential. Per Starling Eck, possibly some rehab potential. CM initiated auth regarding short term rehabilitation. Auth pending [id number].  Expected Discharge Plan:  (TBD) Barriers to Discharge: Continued Medical Work up  Expected Discharge Plan and Services     Post Acute Care Choice:  (TBD) Living arrangements for the past 2 months: Single Family Home                  Social Determinants of Health (SDOH) Interventions SDOH Screenings   Food Insecurity: No Food Insecurity (08/10/2023)  Recent Concern: Food Insecurity - High Risk (07/17/2023)   Received from [facility]  Housing: Low Risk  (08/10/2023)  Recent Concern: Housing - High Risk (07/22/2023)   Received from [facility]  Transportation Needs: Unmet Transportation Needs (08/10/2023)  Utilities: Not At Risk (08/10/2023)  Recent Concern: Utilities - Medium Risk (07/17/2023)   Received from [facility]  Financial Resource Strain: Patient Declined (01/03/2023)   Received from [facility]  Social Connections: Unknown (08/10/2023)  Tobacco Use: High Risk (08/10/2023)    Readmission Risk Interventions     No data to display

## 2023-09-21 NOTE — Progress Notes (Signed)
 Occupational Therapy Treatment Patient Details Name: Allison Hill MRN: [id number] DOB: September 06, 1970 Today's Date: 09/21/2023   History of present illness Pt is a 53 y.o. female with medical history significant of ESRD on HD MWF, refractory HTN, chronic HFpEF, chronic iron deficiency anemia presented with new onset of chest pain, workup for hypertensive emergency.  Since admission, experienced code stroke (4/11); head CT, MRI negative.  However, noted with persistent balance/mobility deficits with several 'near miss' falls in recent days.   OT comments  Pt is supine in bed on arrival. Pleasant and agreeable to OT session. She reports minimal chronic L shoulder pain that nurse had provided pain meds for prior to OT session. Pt performed bed mobility with SUP, increased time and cueing to forward scoot. Pt declined all ADLs d/t already performing this AM. Provided red theraband and edu on BUE strengthening exercises to maximize her strength/endurance. Performed x 3 STS trials from EOB with CGA to RW, cueing for safety and slow descent.  Pt returned to bed with all needs in place and will cont to require skilled acute OT services to maximize her safety and IND to return to PLOF.       If plan is discharge home, recommend the following:  A little help with walking and/or transfers;A little help with bathing/dressing/bathroom;Assistance with cooking/housework;Assistance with feeding;Direct supervision/assist for medications management;Direct supervision/assist for financial management;Assist for transportation;Help with stairs or ramp for entrance;Supervision due to cognitive status   Equipment Recommendations  None recommended by OT    Recommendations for Other Services      Precautions / Restrictions Precautions Precautions: Fall Recall of Precautions/Restrictions: Impaired Restrictions Weight Bearing Restrictions Per Provider Order: No       Mobility Bed Mobility Overal bed mobility:  Needs Assistance Bed Mobility: Sit to Supine, Supine to Sit     Supine to sit: Supervision, Used rails Sit to supine: Supervision   General bed mobility comments: increased time and cueing for scooting    Transfers Overall transfer level: Needs assistance Equipment used: Rollator (4 wheels) Transfers: Sit to/from Stand Sit to Stand: Contact guard assist           General transfer comment: x3 STS from lowest bed height to RW with cueing for proper controlled descent     Balance Overall balance assessment: Needs assistance Sitting-balance support: Feet supported, No upper extremity supported Sitting balance-Leahy Scale: Good Sitting balance - Comments: UB strenthening exercises at EOB     Standing balance-Leahy Scale: Fair Standing balance comment: wide BOS, RW, +!                           ADL either performed or assessed with clinical judgement   ADL Overall ADL's : Needs assistance/impaired                     Lower Body Dressing: Contact guard assist;Sitting/lateral leans Lower Body Dressing Details (indicate cue type and reason): don crocs               General ADL Comments: reports she already performed all her ADLs this morning    Extremity/Trunk Assessment              Vision       Perception     Praxis     Communication Communication Communication: Impaired Factors Affecting Communication: Difficulty expressing self (speech intermittently garbled, but corrects with increased time/effort)   Cognition Arousal: Alert Behavior During Therapy:  WFL for tasks assessed/performed                                 Following commands: Impaired Following commands impaired: Follows one step commands with increased time      Cueing   Cueing Techniques: Verbal cues, Tactile cues  Exercises Other Exercises Other Exercises: Provided red theraband and educated on BUE strengthening exercises to maximize her strength for  carryover to ADLs, transfers and mobility. Other Exercises: Pt performed 10 reps x 2 sets of BUE strengthening exercises while seated at EOB.    Shoulder Instructions       General Comments mild dizziness noted    Pertinent Vitals/ Pain       Pain Assessment Pain Assessment: Faces Faces Pain Scale: Hurts a little bit Pain Location: L shoulder, generalized Pain Descriptors / Indicators: Aching, Discomfort Pain Intervention(s): Monitored during session  Home Living                                          Prior Functioning/Environment              Frequency  Min 1X/week        Progress Toward Goals  OT Goals(current goals can now be found in the care plan section)  Progress towards OT goals: Progressing toward goals  Acute Rehab OT Goals OT Goal Formulation: With patient Time For Goal Achievement: 10/05/23 Potential to Achieve Goals: Good  Plan      Co-evaluation                 AM-PAC OT "6 Clicks" Daily Activity     Outcome Measure   Help from another person eating meals?: None Help from another person taking care of personal grooming?: None Help from another person toileting, which includes using toliet, bedpan, or urinal?: A Little Help from another person bathing (including washing, rinsing, drying)?: A Little Help from another person to put on and taking off regular upper body clothing?: A Little Help from another person to put on and taking off regular lower body clothing?: A Little 6 Click Score: 20    End of Session Equipment Utilized During Treatment: Rolling walker (2 wheels)  OT Visit Diagnosis: Unsteadiness on feet (R26.81);Other abnormalities of gait and mobility (R26.89);Muscle weakness (generalized) (M62.81)   Activity Tolerance Patient tolerated treatment well   Patient Left in bed;with call bell/phone within reach;with bed alarm set   Nurse Communication Mobility status        Time: 1610-9604 OT Time  Calculation (min): 18 min  Charges: OT General Charges $OT Visit: 1 Visit OT Treatments $Therapeutic Activity: 8-22 mins  Taji Barretto, OTR/L  09/21/23, 12:41 PM   Maddelyn Rocca E Dalynn Jhaveri 09/21/2023, 12:39 PM
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
## 2023-09-22 DIAGNOSIS — I132 Hypertensive heart and chronic kidney disease with heart failure and with stage 5 chronic kidney disease, or end stage renal disease: Secondary | ICD-10-CM | POA: Diagnosis not present

## 2023-09-22 DIAGNOSIS — R079 Chest pain, unspecified: Secondary | ICD-10-CM | POA: Diagnosis not present

## 2023-09-22 LAB — RENAL FUNCTION PANEL
Albumin: 3.6 g/dL (ref 3.5–5.0)
Anion gap: 15 (ref 5–15)
BUN: 100 mg/dL — ABNORMAL HIGH (ref 6–20)
CO2: 24 mmol/L (ref 22–32)
Calcium: 8.9 mg/dL (ref 8.9–10.3)
Chloride: 101 mmol/L (ref 98–111)
Creatinine, Ser: 8.82 mg/dL — ABNORMAL HIGH (ref 0.44–1.00)
GFR, Estimated: 5 mL/min — ABNORMAL LOW (ref >60)
Glucose, Bld: 90 mg/dL (ref 70–99)
Phosphorus: 5.6 mg/dL — ABNORMAL HIGH (ref 2.5–4.6)
Potassium: 5.2 mmol/L — ABNORMAL HIGH (ref 3.5–5.1)
Sodium: 140 mmol/L (ref 135–145)

## 2023-09-22 LAB — CBC
HCT: 28.5 % — ABNORMAL LOW (ref 36.0–46.0)
Hemoglobin: 9.2 g/dL — ABNORMAL LOW (ref 12.0–15.0)
MCH: 27.6 pg (ref 26.0–34.0)
MCHC: 32.3 g/dL (ref 30.0–36.0)
MCV: 85.6 fL (ref 80.0–100.0)
Platelets: 246 10*3/uL (ref 150–400)
RBC: 3.33 MIL/uL — ABNORMAL LOW (ref 3.87–5.11)
RDW: 17.3 % — ABNORMAL HIGH (ref 11.5–15.5)
WBC: 6.7 10*3/uL (ref 4.0–10.5)
nRBC: 0 % (ref 0.0–0.2)

## 2023-09-22 MED ORDER — HEPARIN SODIUM (PORCINE) 1000 UNIT/ML DIALYSIS: 1000.0000 [IU] | INTRAMUSCULAR | Status: DC | PRN

## 2023-09-22 MED ORDER — EPOETIN ALFA 4000 UNIT/ML IJ SOLN
4000.0000 [IU] | INTRAMUSCULAR | Status: DC
Start: 2023-09-22 — End: 2023-09-23
  Administered 2023-09-22: 4000 [IU] via INTRAVENOUS

## 2023-09-22 MED ORDER — ALTEPLASE 2 MG IJ SOLR: 2.0000 mg | Freq: Once | INTRAMUSCULAR | Status: DC | PRN

## 2023-09-22 MED ORDER — OXYCODONE HCL 5 MG PO TABS
5.0000 mg | ORAL_TABLET | ORAL | Status: DC | PRN
  Administered 2023-09-22 – 2023-09-23 (×5): 5 mg via ORAL
  Filled 2023-09-22 (×5): qty 1

## 2023-09-22 MED ORDER — EPOETIN ALFA 4000 UNIT/ML IJ SOLN
INTRAMUSCULAR | Status: AC
  Filled 2023-09-22: qty 1

## 2023-09-22 NOTE — Progress Notes (Addendum)
 PT Cancellation Note  Patient Details Name: Allison Hill MRN: [id number] DOB: Dec 18, 1970   Cancelled Treatment:     PT treatment not completed this AM 2/2 to Pt. in HD this morning. PT will attempt later PM today.    Toan Mort H Nazim Kadlec 09/22/2023, 11:06 AM
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
## 2023-09-22 NOTE — Progress Notes (Signed)
   09/22/23 1310  Vitals  Temp 97.9 F (36.6 C)  Pulse Rate 76  Resp 14  BP 127/87  SpO2 100 %  O2 Device Room Air  Weight 67.9 kg  Type of Weight Post-Dialysis  Oxygen Therapy  Patient Activity (if Appropriate) In chair  Pulse Oximetry Type Continuous  Post Treatment  Hemodialysis Intake (mL) 0 mL  Liters Processed 73.6  Fluid Removed (mL) 1500 mL  Tolerated HD Treatment Yes  Post-Hemodialysis Comments completed dialysis no s/s of distress to note tolerated well   Received patient in bed to unit.  Alert and oriented.  Informed consent signed and in chart.   TX duration:  Patient tolerated well.  Transported back to the room  Alert, without acute distress.  Hand-off given to patient's nurse.   Access used: rt hd catheter Access issues: none  Total UF removed: 1500 Medication(s) given: epeotin 4000 Post HD VS: see above Post HD weight: 67.9kg   [facility]

## 2023-09-22 NOTE — Progress Notes (Signed)
 [facility]  ROUNDING NOTE   Subjective:   Patient seen and evaluated during dialysis   HEMODIALYSIS FLOWSHEET:  Blood Flow Rate (mL/min): 350 mL/min Arterial Pressure (mmHg): -139.38 mmHg Venous Pressure (mmHg): 153.31 mmHg TMP (mmHg): 4.85 mmHg Ultrafiltration Rate (mL/min): 829 mL/min Dialysate Flow Rate (mL/min): 300 ml/min Dialysis Fluid Bolus: Normal Saline Bolus Amount (mL): 300 mL  Tolerating treatment seated in chair   Objective:  Vital signs in last 24 hours:  Temp:  [97.9 F (36.6 C)-98.7 F (37.1 C)] 98.3 F (36.8 C) (04/22 0900) Pulse Rate:  [78-86] 81 (04/22 0932) Resp:  [14-18] 18 (04/22 0932) BP: (124-151)/(75-98) 137/98 (04/22 0932) SpO2:  [98 %-100 %] 100 % (04/22 0932) Weight:  [69.5 kg-70.4 kg] 69.5 kg (04/22 0900)  Weight change: -1.1 kg Filed Weights   09/21/23 0500 09/22/23 0500 09/22/23 0900  Weight: 71.5 kg 70.4 kg 69.5 kg    Intake/Output: I/O last 3 completed shifts: In: 360 [P.O.:360] Out: 300 [Urine:300]   Intake/Output this shift:  No intake/output data recorded.  Physical Exam: General: NAD  Head: Normocephalic, atraumatic. Moist oral mucosal membranes  Eyes: Anicteric  Lungs:  Clear to auscultation  Heart: Regular rate and rhythm  Abdomen:  Soft, nontender  Extremities: No peripheral edema.  Neurologic: Alert, moving all four extremities  Skin: No lesions  Access: Rt Chest Permcath    Basic Metabolic Panel: Recent Labs  Lab 09/16/23 0305 09/17/23 0730 09/19/23 1352 09/22/23 0814  NA 134* 138 134* 140  K 4.0 4.8 4.8 5.2*  CL 99 103 95* 101  CO2 25 25 23 24   GLUCOSE 150* 94 95 90  BUN 60* 88* 96* 100*  CREATININE 5.19* 7.50* 7.95* 8.82*  CALCIUM  8.4* 8.4* 8.6* 8.9  PHOS  --  5.1* 5.0* 5.6*    Liver Function Tests: Recent Labs  Lab 09/17/23 0730 09/19/23 1352 09/22/23 0814  ALBUMIN 3.3* 3.5 3.6    No results for input(s): "LIPASE", "AMYLASE" in the last 168 hours. No results for  input(s): "AMMONIA" in the last 168 hours.  CBC: Recent Labs  Lab 09/16/23 0305 09/17/23 0730 09/19/23 1352 09/22/23 0814  WBC 5.1 4.5 5.2 6.7  HGB 9.2* 9.0* 8.7* 9.2*  HCT 27.5* 27.8* 26.8* 28.5*  MCV 84.1 85.3 85.4 85.6  PLT 238 231 225 246    Cardiac Enzymes: No results for input(s): "CKTOTAL", "CKMB", "CKMBINDEX", "TROPONINI" in the last 168 hours.  BNP: Invalid input(s): "POCBNP"  CBG: No results for input(s): "GLUCAP" in the last 168 hours.   Microbiology: Results for orders placed or performed during the hospital encounter of 08/09/23  Resp panel by RT-PCR (RSV, Flu A&B, Covid) Anterior Nasal Swab     Status: None   Collection Time: 08/14/23  3:44 AM   Specimen: Anterior Nasal Swab  Result Value Ref Range Status   SARS Coronavirus 2 by RT PCR NEGATIVE NEGATIVE Final    Comment: (NOTE) SARS-CoV-2 target nucleic acids are NOT DETECTED.  The SARS-CoV-2 RNA is generally detectable in upper respiratory specimens during the acute phase of infection. The lowest concentration of SARS-CoV-2 viral copies this assay can detect is 138 copies/mL. A negative result does not preclude SARS-Cov-2 infection and should not be used as the sole basis for treatment or other patient management decisions. A negative result may occur with  improper specimen collection/handling, submission of specimen other than nasopharyngeal swab, presence of viral mutation(s) within the areas targeted by this assay, and inadequate number of viral copies(<138 copies/mL). A negative  result must be combined with clinical observations, patient history, and epidemiological information. The expected result is Negative.  Fact Sheet for Patients:  BloggerCourse.com  Fact Sheet for Healthcare Providers:  SeriousBroker.it  This test is no t yet approved or cleared by the [place]  FDA and  has been authorized for detection and/or diagnosis of  SARS-CoV-2 by FDA under an Emergency Use Authorization (EUA). This EUA will remain  in effect (meaning this test can be used) for the duration of the COVID-19 declaration under Section 564(b)(1) of the Act, 21 U.S.C.section 360bbb-3(b)(1), unless the authorization is terminated  or revoked sooner.       Influenza A by PCR NEGATIVE NEGATIVE Final   Influenza B by PCR NEGATIVE NEGATIVE Final    Comment: (NOTE) The Xpert Xpress SARS-CoV-2/FLU/RSV plus assay is intended as an aid in the diagnosis of influenza from Nasopharyngeal swab specimens and should not be used as a sole basis for treatment. Nasal washings and aspirates are unacceptable for Xpert Xpress SARS-CoV-2/FLU/RSV testing.  Fact Sheet for Patients: BloggerCourse.com  Fact Sheet for Healthcare Providers: SeriousBroker.it  This test is not yet approved or cleared by the [place]  FDA and has been authorized for detection and/or diagnosis of SARS-CoV-2 by FDA under an Emergency Use Authorization (EUA). This EUA will remain in effect (meaning this test can be used) for the duration of the COVID-19 declaration under Section 564(b)(1) of the Act, 21 U.S.C. section 360bbb-3(b)(1), unless the authorization is terminated or revoked.     Resp Syncytial Virus by PCR NEGATIVE NEGATIVE Final    Comment: (NOTE) Fact Sheet for Patients: BloggerCourse.com  Fact Sheet for Healthcare Providers: SeriousBroker.it  This test is not yet approved or cleared by the [place]  FDA and has been authorized for detection and/or diagnosis of SARS-CoV-2 by FDA under an Emergency Use Authorization (EUA). This EUA will remain in effect (meaning this test can be used) for the duration of the COVID-19 declaration under Section 564(b)(1) of the Act, 21 U.S.C. section 360bbb-3(b)(1), unless the authorization is terminated  or revoked.  Performed at [facility], [address] [place], [place] [address]   Respiratory (~20 pathogens) panel by PCR     Status: None   Collection Time: 08/14/23  3:44 AM   Specimen: Nasopharyngeal Swab; Respiratory  Result Value Ref Range Status   Adenovirus NOT DETECTED NOT DETECTED Final   Coronavirus 229E NOT DETECTED NOT DETECTED Final    Comment: (NOTE) The Coronavirus on the Respiratory Panel, DOES NOT test for the novel  Coronavirus (2019 nCoV)    Coronavirus HKU1 NOT DETECTED NOT DETECTED Final   Coronavirus NL63 NOT DETECTED NOT DETECTED Final   Coronavirus OC43 NOT DETECTED NOT DETECTED Final   Metapneumovirus NOT DETECTED NOT DETECTED Final   Rhinovirus / Enterovirus NOT DETECTED NOT DETECTED Final   Influenza A NOT DETECTED NOT DETECTED Final   Influenza B NOT DETECTED NOT DETECTED Final   Parainfluenza Virus 1 NOT DETECTED NOT DETECTED Final   Parainfluenza Virus 2 NOT DETECTED NOT DETECTED Final   Parainfluenza Virus 3 NOT DETECTED NOT DETECTED Final   Parainfluenza Virus 4 NOT DETECTED NOT DETECTED Final   Respiratory Syncytial Virus NOT DETECTED NOT DETECTED Final   Bordetella pertussis NOT DETECTED NOT DETECTED Final   Bordetella Parapertussis NOT DETECTED NOT DETECTED Final   Chlamydophila pneumoniae NOT DETECTED NOT DETECTED Final   Mycoplasma pneumoniae NOT DETECTED NOT DETECTED Final    Comment: Performed at [facility], [address], [place], [place] [address]  Culture, blood (Routine X 2) w Reflex to ID Panel     Status: None   Collection Time: 08/14/23  9:19 AM   Specimen: BLOOD  Result Value Ref Range Status   Specimen Description BLOOD BLOOD RIGHT HAND  Final   Special Requests   Final    BOTTLES DRAWN AEROBIC AND ANAEROBIC Blood Culture adequate volume   Culture   Final    NO GROWTH 5 DAYS Performed at [facility], [address] [place], [place] [address]    Report Status 08/19/2023 FINAL  Final   Culture, blood (Routine X 2) w Reflex to ID Panel     Status: None   Collection Time: 08/14/23  9:19 AM   Specimen: BLOOD  Result Value Ref Range Status   Specimen Description BLOOD BLOOD LEFT HAND  Final   Special Requests   Final    BOTTLES DRAWN AEROBIC AND ANAEROBIC Blood Culture adequate volume   Culture   Final    NO GROWTH 5 DAYS Performed at [facility], [address] [place], [place] [address]    Report Status 08/19/2023 FINAL  Final  Ear culture     Status: None   Collection Time: 08/14/23 10:11 AM   Specimen: Ear; Other  Result Value Ref Range Status   Specimen Description   Final    EAR Performed at [facility], [address] [place], [place] [address]    Special Requests   Final    RIGHT EAR Performed at [facility], [address] [place], [place] [address]    Culture RARE STREPTOCOCCUS PNEUMONIAE  Final   Report Status 08/16/2023 FINAL  Final   Organism ID, Bacteria STREPTOCOCCUS PNEUMONIAE  Final      Susceptibility   Streptococcus pneumoniae - MIC*    ERYTHROMYCIN <=0.12 SENSITIVE Sensitive     LEVOFLOXACIN  1 SENSITIVE Sensitive     VANCOMYCIN 0.5 SENSITIVE Sensitive     PENICILLIN (meningitis) <=0.06 SENSITIVE Sensitive     PENO - penicillin <=0.06      PENICILLIN (non-meningitis) <=0.06 SENSITIVE Sensitive     PENICILLIN (oral) <=0.06 SENSITIVE Sensitive     CEFTRIAXONE (non-meningitis) <=0.12 SENSITIVE Sensitive     CEFTRIAXONE (meningitis) <=0.12 SENSITIVE Sensitive     * RARE STREPTOCOCCUS PNEUMONIAE    Coagulation Studies: No results for input(s): "LABPROT", "INR" in the last 72 hours.  Urinalysis: No results for input(s): "COLORURINE", "LABSPEC", "PHURINE", "GLUCOSEU", "HGBUR", "BILIRUBINUR", "KETONESUR", "PROTEINUR", "UROBILINOGEN", "NITRITE", "LEUKOCYTESUR" in the last 72 hours.  Invalid input(s): "APPERANCEUR"    Imaging: No results found.     Medications:     amLODipine   10 mg Oral QPM    aspirin  EC  81 mg Oral Daily   atorvastatin   40 mg Oral Daily   calcitRIOL   0.25 mcg Oral Q T,Th,Sa-HD   calcium  carbonate  400 mg of elemental calcium  Oral BID   Chlorhexidine  Gluconate Cloth  6 each Topical Q0600   cinacalcet   30 mg Oral Q supper   epoetin  alfa  10,000 Units Intravenous Q T,Th,Sa-HD   fluticasone   1 spray Each Nare Daily   gabapentin   200 mg Oral BID   heparin   5,000 Units Subcutaneous Q12H   hydrALAZINE   75 mg Oral Q8H   hydrOXYzine   25 mg Oral TID   irbesartan   300 mg Oral QPM   isosorbide  mononitrate  120 mg Oral Daily   labetalol   600 mg Oral BID  pantoprazole   40 mg Oral Daily   polyethylene glycol  17 g Oral Daily   senna-docusate  2 tablet Oral BID   sevelamer  carbonate  1,600 mg Oral TID with meals   terazosin   2 mg Oral BID   alteplase , alteplase , guaiFENesin -dextromethorphan, heparin , heparin , hydrALAZINE , melatonin, ondansetron  (ZOFRAN ) IV, oxyCODONE   Assessment/ Plan:  Allison Hill is a 53 y.o.  female  with past medical conditions including hypertension, CHF, anemia, and end-stage renal disease on hemodialysis, who was admitted to [facility] on 08/09/2023 for Hypertensive urgency [I16.0] Chest pain [R07.9] Nonspecific chest pain [R07.9] Anemia due to chronic kidney disease, on chronic dialysis (HCC) [N18.6, D63.1, Z99.2]  Outpatient dialysis clinic was arranged at [facility] on a TTS schedule, chair time at 530 am.  Due to extended admission, outpatient assignment will need to be confirmed and possibly resubmitted prior to discharge.    1.  End stage renal disease on hemodialysis.  Receiving treatment today, UF 2L as tolerated. Patient received morning antihypertensives, will monitor BP with treatment. Next treatment scheduled for Thursday.   2. Anemia of chronic kidney disease Hemoglobin & Hematocrit     Component Value Date/Time   HGB 9.2 (L) 09/22/2023 0814   HCT 28.5 (L) 09/22/2023 0814   Hemoglobin 9.2.  Will decrease to EPO  4000 units IV with dialysis  3.  Hypertensive urgency, on admission   Blood pressure 134/85 during dialysis. Continue amlodipine , hydralazine , irbesartan , isosorbide , and labetalol .    4. Secondary Hyperparathyroidism:   Lab Results  Component Value Date   PTH 1,248 (H) 08/11/2023   CALCIUM  8.9 09/22/2023   PHOS 5.6 (H) 09/22/2023   Currently prescribed calcium  carbonate, calcitriol , Renvela , and Sensipar .    LOS: 0 Allison Hill 4/22/20259:57 AM

## 2023-09-22 NOTE — Progress Notes (Signed)
 PT Cancellation Note  Patient Details Name: Allison Hill MRN: [id number] DOB: 10-02-1970   Cancelled Treatment:     PT treatment not completed 2/2 as per pt and nurse, after returning from HD, pt just had finished getting bathed and had walked around in the room with nurse. Pt is  stated she is tired and ready to eat lunch. Pt wants to try again tomorrow. PT will return again tomorrow.   Ashley Lawrence PT DPT 2:48 PM,09/22/23

## 2023-09-22 NOTE — Plan of Care (Signed)
  Problem: Education: Goal: Understanding of cardiac disease, CV risk reduction, and recovery process will improve Outcome: Progressing   Problem: Activity: Goal: Ability to tolerate increased activity will improve Outcome: Progressing   Problem: Cardiac: Goal: Ability to achieve and maintain adequate cardiovascular perfusion will improve Outcome: Progressing   Problem: Health Behavior/Discharge Planning: Goal: Ability to safely manage health-related needs after discharge will improve Outcome: Progressing   Problem: Education: Goal: Knowledge of General Education information will improve Description: Including pain rating scale, medication(s)/side effects and non-pharmacologic comfort measures Outcome: Progressing   Problem: Health Behavior/Discharge Planning: Goal: Ability to manage health-related needs will improve Outcome: Progressing   Problem: Clinical Measurements: Goal: Ability to maintain clinical measurements within normal limits will improve Outcome: Progressing Goal: Will remain free from infection Outcome: Progressing Goal: Diagnostic test results will improve Outcome: Progressing Goal: Respiratory complications will improve Outcome: Progressing Goal: Cardiovascular complication will be avoided Outcome: Progressing   Problem: Activity: Goal: Risk for activity intolerance will decrease Outcome: Progressing   Problem: Nutrition: Goal: Adequate nutrition will be maintained Outcome: Progressing   Problem: Coping: Goal: Level of anxiety will decrease Outcome: Progressing   Problem: Elimination: Goal: Will not experience complications related to bowel motility Outcome: Progressing Goal: Will not experience complications related to urinary retention Outcome: Progressing   Problem: Pain Managment: Goal: General experience of comfort will improve and/or be controlled Outcome: Progressing   Problem: Safety: Goal: Ability to remain free from injury will  improve Outcome: Progressing   Problem: Skin Integrity: Goal: Risk for impaired skin integrity will decrease Outcome: Progressing   Problem: Education: Goal: Knowledge of disease or condition will improve Outcome: Progressing Goal: Knowledge of secondary prevention will improve (MUST DOCUMENT ALL) Outcome: Progressing Goal: Knowledge of patient specific risk factors will improve (DELETE if not current risk factor) Outcome: Progressing   Problem: Ischemic Stroke/TIA Tissue Perfusion: Goal: Complications of ischemic stroke/TIA will be minimized Outcome: Progressing   Problem: Coping: Goal: Will verbalize positive feelings about self Outcome: Progressing Goal: Will identify appropriate support needs Outcome: Progressing   Problem: Health Behavior/Discharge Planning: Goal: Ability to manage health-related needs will improve Outcome: Progressing Goal: Goals will be collaboratively established with patient/family Outcome: Progressing   Problem: Self-Care: Goal: Ability to participate in self-care as condition permits will improve Outcome: Progressing Goal: Verbalization of feelings and concerns over difficulty with self-care will improve Outcome: Progressing Goal: Ability to communicate needs accurately will improve Outcome: Progressing   Problem: Nutrition: Goal: Risk of aspiration will decrease Outcome: Progressing Goal: Dietary intake will improve Outcome: Progressing

## 2023-09-22 NOTE — TOC Progression Note (Addendum)
 Transition of Care [facility]) - Progression Note    Patient Details  Name: [name] MRN: [id number] Date of Birth: 1971/06/01  Transition [facility] Care [facility]) CM/SW Contact  Crayton Docker, RN Phone Number: 09/22/2023, 11:19 AM  Clinical Narrative:     CM follow up in [place]. [facility] is approved Plan Auth ID  is [id number], Auth ID: [id number] SNF . CM secure message to [place], Admissions, [employer] and [employer]. CM will continue to follow for discharge care planning needs.    CM call placed to [facility]. CM spoke to [place]. BLS transport scheduled for 1230 on 09/23/2023.  Expected Discharge Plan:  (TBD) Barriers to Discharge: Continued Medical Work up  Expected Discharge Plan and Services     Post Acute Care Choice:  (TBD) Living arrangements for the past 2 months: Single Family Home                   Social Determinants of Health (SDOH) Interventions SDOH Screenings   Food Insecurity: No Food Insecurity (08/10/2023)  Recent Concern: Food Insecurity - High Risk (07/17/2023)   Received from [facility]  Housing: Low Risk  (08/10/2023)  Recent Concern: Housing - High Risk (07/22/2023)   Received from [facility]  Transportation Needs: Unmet Transportation Needs (08/10/2023)  Utilities: Not At Risk (08/10/2023)  Recent Concern: Utilities - Medium Risk (07/17/2023)   Received from [facility]  Financial Resource Strain: Patient Declined (01/03/2023)   Received from [facility]  Social Connections: Unknown (08/10/2023)  Tobacco Use: High Risk (08/10/2023)    Readmission Risk Interventions     No data to display

## 2023-09-23 DIAGNOSIS — R079 Chest pain, unspecified: Secondary | ICD-10-CM | POA: Diagnosis not present

## 2023-09-23 DIAGNOSIS — I132 Hypertensive heart and chronic kidney disease with heart failure and with stage 5 chronic kidney disease, or end stage renal disease: Secondary | ICD-10-CM | POA: Diagnosis not present

## 2023-09-23 MED ORDER — IRBESARTAN 300 MG PO TABS: 300.0000 mg | ORAL_TABLET | Freq: Every evening | ORAL | Status: DC

## 2023-09-23 MED ORDER — SENNOSIDES-DOCUSATE SODIUM 8.6-50 MG PO TABS: 2.0000 | ORAL_TABLET | Freq: Two times a day (BID) | ORAL | Status: DC | PRN

## 2023-09-23 MED ORDER — POLYETHYLENE GLYCOL 3350 17 G PO PACK: 17.0000 g | PACK | Freq: Every day | ORAL | Status: AC | PRN

## 2023-09-23 MED ORDER — FLUTICASONE PROPIONATE 50 MCG/ACT NA SUSP: 1.0000 | Freq: Every day | NASAL | Status: AC | PRN

## 2023-09-23 MED ORDER — HYDROXYZINE HCL 25 MG PO TABS: 25.0000 mg | ORAL_TABLET | Freq: Three times a day (TID) | ORAL | Status: DC | PRN

## 2023-09-23 MED ORDER — CALCIUM CARBONATE ANTACID 500 MG PO CHEW: 400.0000 mg | CHEWABLE_TABLET | Freq: Two times a day (BID) | ORAL | Status: AC

## 2023-09-23 MED ORDER — HYDRALAZINE HCL 25 MG PO TABS: 75.0000 mg | ORAL_TABLET | Freq: Three times a day (TID) | ORAL | Status: DC

## 2023-09-23 MED ORDER — PANTOPRAZOLE SODIUM 40 MG PO TBEC: 40.0000 mg | DELAYED_RELEASE_TABLET | Freq: Every day | ORAL | Status: AC

## 2023-09-23 MED ORDER — EPOETIN ALFA 4000 UNIT/ML IJ SOLN: 4000.0000 [IU] | INTRAMUSCULAR | Status: AC

## 2023-09-23 MED ORDER — CINACALCET HCL 30 MG PO TABS: 30.0000 mg | ORAL_TABLET | Freq: Every day | ORAL | Status: DC

## 2023-09-23 MED ORDER — LABETALOL HCL 300 MG PO TABS: 600.0000 mg | ORAL_TABLET | Freq: Two times a day (BID) | ORAL | Status: DC

## 2023-09-23 MED ORDER — CALCITRIOL 0.25 MCG PO CAPS: 0.2500 ug | ORAL_CAPSULE | ORAL | Status: DC
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
## 2023-09-23 NOTE — TOC Transition Note (Addendum)
 Transition of Care [facility]) - Discharge Note   Patient Details  Name: Allison Hill MRN: [id number] Date of Birth: 1970/10/13  [facility] CM/SW Contact:  Crayton Docker, RN Phone Number: 09/23/2023, 10:59 AM   Clinical Narrative:     Discharge orders noted, discharge summary noted. CM faxed discharge summary and SNF transfer report to [employer] and [employer]. CM call to [employer], phone: [phone] regarding BLS transport confirmation. CM spoke to [facility]. BLS transport confirmed for 1230. CM secure message to [place], Admissions, [facility] SN[facility] regarding BLS transport time and discharge summary faxed. Per [place], patient will admit to Room A-2, Bed A.  CM alert to RN Zelpha Hides regarding BLS transport time, room number and RN report number.    CM call to patient's daughter, Allison Hill, phone: [phone] regarding SNF acceptance and admission today. Per patient's daughter, verbalized understanding and gratitude.   Final next level of care: Skilled Nursing Facility Barriers to Discharge: No Barriers Identified   Patient Goals and CMS Choice    SNF  Discharge Placement    SNF        Discharge Plan and Services Additional resources added to the After Visit Summary for       SNF STR and LTC         Social Drivers of Health (SDOH) Interventions SDOH Screenings   Food Insecurity: No Food Insecurity (08/10/2023)  Recent Concern: Food Insecurity - High Risk (07/17/2023)   Received from [facility]  Housing: Low Risk  (08/10/2023)  Recent Concern: Housing - High Risk (07/22/2023)   Received from [facility]  Transportation Needs: Unmet Transportation Needs (08/10/2023)  Utilities: Not At Risk (08/10/2023)  Recent Concern: Utilities - Medium Risk (07/17/2023)   Received from [facility]  Financial Resource Strain: Patient Declined (01/03/2023)   Received from [facility]  Social Connections: Unknown (08/10/2023)  Tobacco  Use: High Risk (08/10/2023)     Readmission Risk Interventions     No data to display

## 2023-10-24 ENCOUNTER — Emergency Department

## 2023-10-24 ENCOUNTER — Observation Stay
Admission: EM | Admit: 2023-10-24 | Discharge: 2023-10-27 | Disposition: A | Discharge status: Home / Self Care | Attending: Internal Medicine | Admitting: Internal Medicine

## 2023-10-24 DIAGNOSIS — H81399 Other peripheral vertigo, unspecified ear: Principal | ICD-10-CM

## 2023-10-24 DIAGNOSIS — I132 Hypertensive heart and chronic kidney disease with heart failure and with stage 5 chronic kidney disease, or end stage renal disease: Secondary | ICD-10-CM | POA: Diagnosis not present

## 2023-10-24 DIAGNOSIS — I5030 Unspecified diastolic (congestive) heart failure: Secondary | ICD-10-CM | POA: Diagnosis not present

## 2023-10-24 DIAGNOSIS — R42 Dizziness and giddiness: Secondary | ICD-10-CM

## 2023-10-24 DIAGNOSIS — R519 Headache, unspecified: Secondary | ICD-10-CM | POA: Diagnosis present

## 2023-10-24 DIAGNOSIS — H748X3 Other specified disorders of middle ear and mastoid, bilateral: Secondary | ICD-10-CM | POA: Insufficient documentation

## 2023-10-24 DIAGNOSIS — D631 Anemia in chronic kidney disease: Secondary | ICD-10-CM | POA: Diagnosis not present

## 2023-10-24 DIAGNOSIS — F17210 Nicotine dependence, cigarettes, uncomplicated: Secondary | ICD-10-CM | POA: Diagnosis not present

## 2023-10-24 DIAGNOSIS — N186 End stage renal disease: Secondary | ICD-10-CM | POA: Diagnosis not present

## 2023-10-24 DIAGNOSIS — Q670 Congenital facial asymmetry: Secondary | ICD-10-CM | POA: Insufficient documentation

## 2023-10-24 DIAGNOSIS — Z79899 Other long term (current) drug therapy: Secondary | ICD-10-CM | POA: Insufficient documentation

## 2023-10-24 DIAGNOSIS — Z992 Dependence on renal dialysis: Secondary | ICD-10-CM | POA: Diagnosis not present

## 2023-10-24 DIAGNOSIS — I10 Essential (primary) hypertension: Secondary | ICD-10-CM | POA: Diagnosis present

## 2023-10-24 DIAGNOSIS — Z8673 Personal history of transient ischemic attack (TIA), and cerebral infarction without residual deficits: Secondary | ICD-10-CM

## 2023-10-24 DIAGNOSIS — Z7982 Long term (current) use of aspirin: Secondary | ICD-10-CM | POA: Insufficient documentation

## 2023-10-24 LAB — COMPREHENSIVE METABOLIC PANEL WITH GFR
ALT: 16 U/L (ref 0–44)
AST: 27 U/L (ref 15–41)
Albumin: 3.9 g/dL (ref 3.5–5.0)
Alkaline Phosphatase: 277 U/L — ABNORMAL HIGH (ref 38–126)
Anion gap: 14 (ref 5–15)
BUN: 94 mg/dL — ABNORMAL HIGH (ref 6–20)
CO2: 24 mmol/L (ref 22–32)
Calcium: 8.2 mg/dL — ABNORMAL LOW (ref 8.9–10.3)
Chloride: 102 mmol/L (ref 98–111)
Creatinine, Ser: 7.68 mg/dL — ABNORMAL HIGH (ref 0.44–1.00)
GFR, Estimated: 6 mL/min — ABNORMAL LOW (ref >60)
Glucose, Bld: 106 mg/dL — ABNORMAL HIGH (ref 70–99)
Potassium: 4.7 mmol/L (ref 3.5–5.1)
Sodium: 140 mmol/L (ref 135–145)
Total Bilirubin: 0.4 mg/dL (ref 0.0–1.2)
Total Protein: 7.4 g/dL (ref 6.5–8.1)

## 2023-10-24 LAB — DIFFERENTIAL
Abs Immature Granulocytes: 0.01 10*3/uL (ref 0.00–0.07)
Basophils Absolute: 0 10*3/uL (ref 0.0–0.1)
Basophils Relative: 0 %
Eosinophils Absolute: 0.2 10*3/uL (ref 0.0–0.5)
Eosinophils Relative: 3 %
Immature Granulocytes: 0 %
Lymphocytes Relative: 20 %
Lymphs Abs: 1.3 10*3/uL (ref 0.7–4.0)
Monocytes Absolute: 0.6 10*3/uL (ref 0.1–1.0)
Monocytes Relative: 8 %
Neutro Abs: 4.5 10*3/uL (ref 1.7–7.7)
Neutrophils Relative %: 69 %

## 2023-10-24 LAB — TROPONIN I (HIGH SENSITIVITY): Troponin I (High Sensitivity): 55 ng/L — ABNORMAL HIGH (ref <18)

## 2023-10-24 LAB — CBC
HCT: 28.1 % — ABNORMAL LOW (ref 36.0–46.0)
Hemoglobin: 9.1 g/dL — ABNORMAL LOW (ref 12.0–15.0)
MCH: 26.9 pg (ref 26.0–34.0)
MCHC: 32.4 g/dL (ref 30.0–36.0)
MCV: 83.1 fL (ref 80.0–100.0)
Platelets: 160 10*3/uL (ref 150–400)
RBC: 3.38 MIL/uL — ABNORMAL LOW (ref 3.87–5.11)
RDW: 17 % — ABNORMAL HIGH (ref 11.5–15.5)
WBC: 6.6 10*3/uL (ref 4.0–10.5)
nRBC: 0 % (ref 0.0–0.2)

## 2023-10-24 LAB — ETHANOL: Alcohol, Ethyl (B): <15 mg/dL (ref <15)

## 2023-10-24 LAB — PROTIME-INR
INR: 1 (ref 0.8–1.2)
Prothrombin Time: 13.1 s (ref 11.4–15.2)

## 2023-10-24 LAB — APTT: aPTT: 44 s — ABNORMAL HIGH (ref 24–36)

## 2023-10-24 MED ORDER — SODIUM CHLORIDE 0.9% FLUSH
3.0000 mL | Freq: Once | INTRAVENOUS | Status: AC
  Administered 2023-10-24: 3 mL via INTRAVENOUS

## 2023-10-24 MED ORDER — METOCLOPRAMIDE HCL 5 MG/ML IJ SOLN
10.0000 mg | Freq: Once | INTRAMUSCULAR | Status: AC
  Administered 2023-10-24: 10 mg via INTRAVENOUS
  Filled 2023-10-24: qty 2

## 2023-10-24 NOTE — ED Triage Notes (Signed)
 Pt comes from [facility]. Pt states that she has had a bad headache since lunch time, c/o of photophobia with blurred vision in both eyes. Denies nausea. Pt had L sided weakness, L sided facial droop. Pt states LSN around 1-2pm.

## 2023-10-24 NOTE — ED Notes (Signed)
 First nurse:  BIB [facility] from [facility] in [place] for headache. CAOX4. 88 HR  168/104 100% RA Pt is baseline per staff but was worried about the severe headache. Denies blurred vision.

## 2023-10-24 NOTE — ED Notes (Signed)
 Pt to ct

## 2023-10-24 NOTE — ED Provider Notes (Signed)
 [facility] Provider Note    Event Date/Time   First MD Initiated Contact with Patient 10/24/23 2150     (approximate)   History   Headache and Stroke Symptoms   HPI  Allison Hill is a 53 y.o. female history of ESRD on dialysis Monday Wednesday Friday, substance abuse, CHF, prior history of CVA with left-sided residual deficits presenting with posterior headache.  States last normal was Friday evening.  States she woke up today, around 11 AM, states that she feels her speech is slower than usual, noted blurry vision, double vision, posterior headache.  She denies any trauma or falls.  States that she missed her dialysis session on Friday.  States that she has residual facial droop on the left, left upper and lower extremity weakness from prior stroke.  States that she has been also feeling decreased sensation to her face, left upper and lower extremity for months.  States that she has had intermittent blurry vision as well as diplopia for a long time.  States she thinks she is able to raise her left eyebrow, and feels like her speech is slower today.  She denies any urinary symptoms, no chest pain, shortness of breath, nausea, vomiting, diarrhea, abdominal pain, back pain.  On independent review, she was admitted in April for chest pain and hypertensive urgency, her family was also refusing to take patient home.  Had an MRI done in April that was negative for acute stroke.  Neurology had also evaluated her in April, stroke alert was called at that time for left-sided numbness, it is noted that she does have left-sided numbness at baseline from her prior stroke.  She was loaded to have left nasolabial flattening during that time, no ataxia.  They favored functional neurological symptom disorder/conversion.     Physical Exam   Triage Vital Signs: ED Triage Vitals  Encounter Vitals Group     BP 10/24/23 2122 (!) 160/103     Systolic BP Percentile --       Diastolic BP Percentile --      Pulse Rate 10/24/23 2122 85     Resp 10/24/23 2122 16     Temp 10/24/23 2122 97.8 F (36.6 C)     Temp Source 10/24/23 2122 Oral     SpO2 10/24/23 2122 100 %     Weight --      Height --      Head Circumference --      Peak Flow --      Pain Score 10/24/23 2127 8     Pain Loc --      Pain Education --      Exclude from Growth Chart --     Most recent vital signs: Vitals:   10/24/23 2304 10/24/23 2306  BP: (!) 163/103   Pulse:  88  Resp: 11 16  Temp:    SpO2:  99%     General: Awake, no distress.  CV:  Good peripheral perfusion.  Resp:  Normal effort.  No tachypnea or increased work of breathing Abd:  No distention.  Soft nontender Other:  Pupils are equal and reactive, extraocular movements are intact, she has left nasolabial flattening which she says is old. slightly slowed speech, when raising her eyebrows, there is furrowing for forehead on the right but not on the left.  She has left dysmetria which she says is old, left upper and lower extremity as well as decreased sensation to the left side of  her body that she says is old.   ED Results / Procedures / Treatments   Labs (all labs ordered are listed, but only abnormal results are displayed) Labs Reviewed  APTT - Abnormal; Notable for the following components:      Result Value   aPTT 44 (*)    All other components within normal limits  CBC - Abnormal; Notable for the following components:   RBC 3.38 (*)    Hemoglobin 9.1 (*)    HCT 28.1 (*)    RDW 17.0 (*)    All other components within normal limits  COMPREHENSIVE METABOLIC PANEL WITH GFR - Abnormal; Notable for the following components:   Glucose, Bld 106 (*)    BUN 94 (*)    Creatinine, Ser 7.68 (*)    Calcium  8.2 (*)    Alkaline Phosphatase 277 (*)    GFR, Estimated 6 (*)    All other components within normal limits  TROPONIN I (HIGH SENSITIVITY) - Abnormal; Notable for the following components:   Troponin I (High  Sensitivity) 55 (*)    All other components within normal limits  PROTIME-INR  DIFFERENTIAL  ETHANOL  URINALYSIS, W/ REFLEX TO CULTURE (INFECTION SUSPECTED)  URINE DRUG SCREEN, QUALITATIVE ([facility] ONLY)  I-STAT CREATININE, ED  CBG MONITORING, ED     EKG  EKG shows, EKG shows sinus rhythm, rate 89, normal QRS, normal QTc, T wave flattening in 1, aVL, no ischemic ST elevation, T wave changes are new compared to prior   RADIOLOGY On my independent interpretation, CT head without obvious intracranial hemorrhage   PROCEDURES:  Critical Care performed: No  Procedures   MEDICATIONS ORDERED IN ED: Medications  sodium chloride  flush (NS) 0.9 % injection 3 mL (3 mLs Intravenous Given 10/24/23 2240)  metoCLOPramide (REGLAN) injection 10 mg (10 mg Intravenous Given 10/24/23 2232)     IMPRESSION / MDM / ASSESSMENT AND PLAN / ED COURSE  I reviewed the triage vital signs and the nursing notes.                              Differential diagnosis includes, but is not limited to, CVA, TIA, Bell's palsy, recrudescence, conversion disorder, functional neurologic symptom, substance use, electrolyte derangements, migraine, tension headache.  Will get labs, EKG, troponin, chest x-ray, CT head, UA, MRI.  Will give her some Reglan here.  Stroke alert was not activated given that patient's last normal was yesterday evening  Patient's presentation is most consistent with acute presentation with potential threat to life or bodily function.  Independent interpretation of labs and imaging below.  MRI is negative for acute stroke.  Signed outpatient to oncoming team pending reevaluation after meds for headache, if left upper facial asymmetry is persistent, will likely need treatment for Bell's palsy.  The patient is on the cardiac monitor to evaluate for evidence of arrhythmia and/or significant heart rate changes.   Clinical Course as of 10/24/23 2336  Sat Oct 24, 2023  2226 CT HEAD WO  CONTRAST IMPRESSION: 1. Stable head CT, no acute intracranial process. 2. Stable right mastoid effusion.   [TT]  2334 MR BRAIN WO CONTRAST IMPRESSION: 1. No evidence of acute intracranial abnormality. 2. Remote infarcts and chronic microvascular ischemic disease. 3. Many chronic microhemorrhages, likely due to longstanding hypertension. 4. Right mastoid effusion.   [TT]    Clinical Course User Index [TT] Drenda Gentle Richard Champion, MD     FINAL CLINICAL IMPRESSION(S) /  ED DIAGNOSES   Final diagnoses:  Acute nonintractable headache, unspecified headache type  History of CVA (cerebrovascular accident)  Facial asymmetry     Rx / DC Orders   ED Discharge Orders     None        Note:  This document was prepared using Dragon voice recognition software and may include unintentional dictation errors.    Shane Darling, MD 10/24/23 [phone]

## 2023-10-25 ENCOUNTER — Other Ambulatory Visit: Payer: Self-pay

## 2023-10-25 DIAGNOSIS — Z8673 Personal history of transient ischemic attack (TIA), and cerebral infarction without residual deficits: Secondary | ICD-10-CM

## 2023-10-25 DIAGNOSIS — I10 Essential (primary) hypertension: Secondary | ICD-10-CM

## 2023-10-25 DIAGNOSIS — H81399 Other peripheral vertigo, unspecified ear: Principal | ICD-10-CM

## 2023-10-25 LAB — URINE DRUG SCREEN, QUALITATIVE (ARMC ONLY)
Amphetamines, Ur Screen: NOT DETECTED
Barbiturates, Ur Screen: NOT DETECTED
Benzodiazepine, Ur Scrn: NOT DETECTED
Cannabinoid 50 Ng, Ur ~~LOC~~: NOT DETECTED
Cocaine Metabolite,Ur ~~LOC~~: NOT DETECTED
MDMA (Ecstasy)Ur Screen: NOT DETECTED
Methadone Scn, Ur: NOT DETECTED
Opiate, Ur Screen: NOT DETECTED
Phencyclidine (PCP) Ur S: NOT DETECTED
Tricyclic, Ur Screen: NOT DETECTED

## 2023-10-25 LAB — URINALYSIS, W/ REFLEX TO CULTURE (INFECTION SUSPECTED)
Bacteria, UA: NONE SEEN
Bilirubin Urine: NEGATIVE
Glucose, UA: NEGATIVE mg/dL
Hgb urine dipstick: NEGATIVE
Ketones, ur: NEGATIVE mg/dL
Leukocytes,Ua: NEGATIVE
Nitrite: NEGATIVE
Protein, ur: 100 mg/dL — AB
Specific Gravity, Urine: 1.013 (ref 1.005–1.030)
pH: 7 (ref 5.0–8.0)

## 2023-10-25 LAB — HEPATITIS B SURFACE ANTIGEN: Hepatitis B Surface Ag: NONREACTIVE

## 2023-10-25 MED ORDER — SEVELAMER CARBONATE 800 MG PO TABS
1600.0000 mg | ORAL_TABLET | Freq: Three times a day (TID) | ORAL | Status: DC
  Administered 2023-10-26 – 2023-10-27 (×5): 1600 mg via ORAL
  Filled 2023-10-25 (×5): qty 2

## 2023-10-25 MED ORDER — HYDRALAZINE HCL 50 MG PO TABS
75.0000 mg | ORAL_TABLET | Freq: Three times a day (TID) | ORAL | Status: DC
  Administered 2023-10-25 – 2023-10-26 (×4): 75 mg via ORAL
  Filled 2023-10-25 (×4): qty 2

## 2023-10-25 MED ORDER — ORAL CARE MOUTH RINSE: 15.0000 mL | OROMUCOSAL | Status: DC | PRN

## 2023-10-25 MED ORDER — MECLIZINE HCL 25 MG PO TABS
25.0000 mg | ORAL_TABLET | Freq: Once | ORAL | Status: AC
  Administered 2023-10-25: 25 mg via ORAL
  Filled 2023-10-25: qty 1

## 2023-10-25 MED ORDER — CALCIUM CARBONATE ANTACID 500 MG PO CHEW
400.0000 mg | CHEWABLE_TABLET | Freq: Two times a day (BID) | ORAL | Status: DC
  Administered 2023-10-25 – 2023-10-27 (×5): 400 mg via ORAL
  Filled 2023-10-25 (×5): qty 2

## 2023-10-25 MED ORDER — POLYETHYLENE GLYCOL 3350 17 G PO PACK
17.0000 g | PACK | Freq: Every day | ORAL | Status: DC | PRN
  Administered 2023-10-25: 17 g via ORAL
  Filled 2023-10-25: qty 1

## 2023-10-25 MED ORDER — IRBESARTAN 150 MG PO TABS: 300.0000 mg | ORAL_TABLET | Freq: Every evening | ORAL | Status: DC

## 2023-10-25 MED ORDER — CINACALCET HCL 30 MG PO TABS
30.0000 mg | ORAL_TABLET | Freq: Every day | ORAL | Status: DC
  Administered 2023-10-25 – 2023-10-26 (×2): 30 mg via ORAL
  Filled 2023-10-25 (×3): qty 1

## 2023-10-25 MED ORDER — DOCUSATE SODIUM 100 MG PO CAPS
100.0000 mg | ORAL_CAPSULE | Freq: Two times a day (BID) | ORAL | Status: DC
  Administered 2023-10-25 – 2023-10-27 (×4): 100 mg via ORAL
  Filled 2023-10-25 (×4): qty 1

## 2023-10-25 MED ORDER — LABETALOL HCL 200 MG PO TABS
600.0000 mg | ORAL_TABLET | Freq: Two times a day (BID) | ORAL | Status: DC
  Administered 2023-10-25 – 2023-10-27 (×4): 600 mg via ORAL
  Filled 2023-10-25 (×4): qty 3

## 2023-10-25 MED ORDER — ONDANSETRON HCL 4 MG/2ML IJ SOLN: 4.0000 mg | Freq: Four times a day (QID) | INTRAMUSCULAR | Status: DC | PRN

## 2023-10-25 MED ORDER — ISOSORBIDE MONONITRATE ER 30 MG PO TB24
120.0000 mg | ORAL_TABLET | Freq: Every day | ORAL | Status: DC
  Administered 2023-10-25 – 2023-10-27 (×3): 120 mg via ORAL
  Filled 2023-10-25 (×3): qty 4

## 2023-10-25 MED ORDER — HYDRALAZINE HCL 50 MG PO TABS
75.0000 mg | ORAL_TABLET | Freq: Once | ORAL | Status: AC
  Administered 2023-10-25: 75 mg via ORAL
  Filled 2023-10-25: qty 2

## 2023-10-25 MED ORDER — METOCLOPRAMIDE HCL 5 MG/ML IJ SOLN
10.0000 mg | Freq: Four times a day (QID) | INTRAMUSCULAR | Status: DC
  Administered 2023-10-25: 10 mg via INTRAVENOUS
  Filled 2023-10-25: qty 2

## 2023-10-25 MED ORDER — ACETAMINOPHEN 325 MG PO TABS: 650.0000 mg | ORAL_TABLET | Freq: Four times a day (QID) | ORAL | Status: DC | PRN

## 2023-10-25 MED ORDER — LABETALOL HCL 200 MG PO TABS
600.0000 mg | ORAL_TABLET | Freq: Once | ORAL | Status: AC
  Administered 2023-10-25: 600 mg via ORAL
  Filled 2023-10-25: qty 3

## 2023-10-25 MED ORDER — HEPARIN SODIUM (PORCINE) 5000 UNIT/ML IJ SOLN
5000.0000 [IU] | Freq: Three times a day (TID) | INTRAMUSCULAR | Status: DC
  Administered 2023-10-25 – 2023-10-26 (×6): 5000 [IU] via SUBCUTANEOUS
  Filled 2023-10-25 (×8): qty 1

## 2023-10-25 MED ORDER — ACETAMINOPHEN 650 MG RE SUPP: 650.0000 mg | Freq: Four times a day (QID) | RECTAL | Status: DC | PRN

## 2023-10-25 MED ORDER — GABAPENTIN 100 MG PO CAPS
200.0000 mg | ORAL_CAPSULE | Freq: Two times a day (BID) | ORAL | Status: DC
  Administered 2023-10-25 – 2023-10-27 (×4): 200 mg via ORAL
  Filled 2023-10-25 (×4): qty 2

## 2023-10-25 MED ORDER — HYDROCODONE-ACETAMINOPHEN 5-325 MG PO TABS: 1.0000 | ORAL_TABLET | ORAL | Status: DC | PRN

## 2023-10-25 MED ORDER — ONDANSETRON HCL 4 MG PO TABS: 4.0000 mg | ORAL_TABLET | Freq: Four times a day (QID) | ORAL | Status: DC | PRN

## 2023-10-25 MED ORDER — AMLODIPINE BESYLATE 10 MG PO TABS
10.0000 mg | ORAL_TABLET | Freq: Every day | ORAL | Status: DC
  Administered 2023-10-25 – 2023-10-27 (×3): 10 mg via ORAL
  Filled 2023-10-25 (×3): qty 1

## 2023-10-25 MED ORDER — MORPHINE SULFATE (PF) 2 MG/ML IV SOLN: 2.0000 mg | INTRAVENOUS | Status: DC | PRN

## 2023-10-25 MED ORDER — HYDROXYZINE HCL 50 MG PO TABS: 25.0000 mg | ORAL_TABLET | Freq: Three times a day (TID) | ORAL | Status: DC | PRN

## 2023-10-25 MED ORDER — IRBESARTAN 150 MG PO TABS
300.0000 mg | ORAL_TABLET | Freq: Once | ORAL | Status: AC
  Administered 2023-10-25: 300 mg via ORAL
  Filled 2023-10-25: qty 2

## 2023-10-25 MED ORDER — MECLIZINE HCL 25 MG PO TABS: 25.0000 mg | ORAL_TABLET | Freq: Three times a day (TID) | ORAL | Status: DC | PRN

## 2023-10-25 MED ORDER — PROCHLORPERAZINE EDISYLATE 10 MG/2ML IJ SOLN
10.0000 mg | Freq: Once | INTRAMUSCULAR | Status: AC
  Administered 2023-10-25: 10 mg via INTRAVENOUS
  Filled 2023-10-25: qty 2

## 2023-10-25 MED ORDER — ATORVASTATIN CALCIUM 20 MG PO TABS
40.0000 mg | ORAL_TABLET | Freq: Every day | ORAL | Status: DC
  Administered 2023-10-25 – 2023-10-27 (×3): 40 mg via ORAL
  Filled 2023-10-25 (×3): qty 2

## 2023-10-25 MED ORDER — CALCITRIOL 0.25 MCG PO CAPS
0.2500 ug | ORAL_CAPSULE | ORAL | Status: DC
  Administered 2023-10-27: 0.25 ug via ORAL
  Filled 2023-10-25: qty 1

## 2023-10-25 MED ORDER — EPOETIN ALFA-EPBX 10000 UNIT/ML IJ SOLN
4000.0000 [IU] | INTRAMUSCULAR | Status: DC
  Filled 2023-10-25: qty 2

## 2023-10-25 MED ORDER — CHLORHEXIDINE GLUCONATE CLOTH 2 % EX PADS
6.0000 | MEDICATED_PAD | Freq: Every day | CUTANEOUS | Status: DC
  Administered 2023-10-26 – 2023-10-27 (×2): 6 via TOPICAL

## 2023-10-25 MED ORDER — PANTOPRAZOLE SODIUM 40 MG PO TBEC
40.0000 mg | DELAYED_RELEASE_TABLET | Freq: Every day | ORAL | Status: DC
  Administered 2023-10-25 – 2023-10-27 (×3): 40 mg via ORAL
  Filled 2023-10-25 (×3): qty 1

## 2023-10-25 MED ORDER — IRBESARTAN 150 MG PO TABS
300.0000 mg | ORAL_TABLET | Freq: Every evening | ORAL | Status: DC
  Administered 2023-10-25 – 2023-10-26 (×2): 300 mg via ORAL
  Filled 2023-10-25 (×2): qty 2

## 2023-10-25 MED ORDER — TERAZOSIN HCL 2 MG PO CAPS
2.0000 mg | ORAL_CAPSULE | Freq: Two times a day (BID) | ORAL | Status: DC
  Administered 2023-10-25 – 2023-10-27 (×4): 2 mg via ORAL
  Filled 2023-10-25 (×4): qty 1

## 2023-10-25 MED ORDER — PROCHLORPERAZINE EDISYLATE 10 MG/2ML IJ SOLN: 10.0000 mg | Freq: Four times a day (QID) | INTRAMUSCULAR | Status: DC | PRN

## 2023-10-25 MED ORDER — MECLIZINE HCL 25 MG PO TABS
25.0000 mg | ORAL_TABLET | Freq: Three times a day (TID) | ORAL | Status: DC
  Administered 2023-10-25 – 2023-10-27 (×7): 25 mg via ORAL
  Filled 2023-10-25 (×8): qty 1

## 2023-10-25 MED ORDER — ASPIRIN 81 MG PO TBEC
81.0000 mg | DELAYED_RELEASE_TABLET | Freq: Every day | ORAL | Status: DC
  Administered 2023-10-25 – 2023-10-27 (×3): 81 mg via ORAL
  Filled 2023-10-25 (×3): qty 1
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
## 2023-10-25 NOTE — H&P (Addendum)
 History and Physical    Patient: Allison Hill HQI:[id number] DOB: 12-Feb-1971 DOA: 10/24/2023 DOS: the patient was seen and examined on 10/25/2023 PCP: Valerie Gather, MD  Patient coming from: Home  Chief Complaint:  Chief Complaint  Patient presents with   Headache   Stroke Symptoms   HPI: Allison Hill is a 53 y.o. female with medical history significant of ESRD on HD MWF, uncontrolled HTN, chronic HFpEF, chronic iron deficiency anemia , being admitted for intractable peripheral vertigo not responding to treatment in the ED, ruled out for central vertigo with MRI ED course and data review: BP was 160/103 on arrival with otherwise normal vitals Labs unremarkable with baseline anemia, normal WBC, baseline CMP, troponin 55, unremarkable UA with clean UDS  EKG, personally viewed and interpreted showed NSR at 89 with prolonged QT and nonspecific ST-T wave changes Chest x-ray was nonacute and MRI brain showed remote infarcts and chronic microvascular ischemic disease with many chronic microhemorrhages but no evidence of acute intracranial abnormality.  It did show a right mastoid effusion  Patient treated with meclizine, Compazine, Reglan but was dizzy when they attempted to stand her in the ED  Hospitalist consulted for admission.    Past Medical History:  Diagnosis Date   CHF (congestive heart failure) (HCC)    Hypertension    Renal disorder    History reviewed. No pertinent surgical history. Social History:  reports that she has been smoking cigarettes. She has never used smokeless tobacco. No history on file for alcohol use and drug use.  Allergies  Allergen Reactions   Ceftriaxone Shortness Of Breath   Shellfish Allergy Hives   Minoxidil Swelling    History reviewed. No pertinent family history.  Prior to Admission medications   Medication Sig Start Date End Date Taking? Authorizing Provider  amLODipine  (NORVASC ) 10 MG tablet Take 10 mg by mouth daily.     [provider]  aspirin  EC 81 MG tablet Take by mouth.    [provider]  atorvastatin  (LIPITOR) 40 MG tablet Take 40 mg by mouth daily. 07/05/23   [provider]  calcitRIOL  (ROCALTROL ) 0.25 MCG capsule Take 1 capsule (0.25 mcg total) by mouth Every Tuesday,Thursday,and Saturday with dialysis. 09/24/23   Garrison Kanner, MD  calcium  carbonate (TUMS - DOSED IN MG ELEMENTAL CALCIUM ) 500 MG chewable tablet Chew 2 tablets (400 mg of elemental calcium  total) by mouth 2 (two) times daily. 09/23/23   Garrison Kanner, MD  cinacalcet  (SENSIPAR ) 30 MG tablet Take 1 tablet (30 mg total) by mouth daily with supper. 09/23/23   Garrison Kanner, MD  epoetin  alfa (EPOGEN ) 4000 UNIT/ML injection Inject 1 mL (4,000 Units total) into the vein Every Tuesday,Thursday,and Saturday with dialysis. 09/24/23   Garrison Kanner, MD  fluticasone  (FLONASE ) 50 MCG/ACT nasal spray Place 1 spray into both nostrils daily as needed for allergies or rhinitis. 09/23/23   Garrison Kanner, MD  gabapentin  (NEURONTIN ) 100 MG capsule Take 200 mg by mouth 2 (two) times daily.    [provider]  hydrALAZINE  (APRESOLINE ) 25 MG tablet Take 3 tablets (75 mg total) by mouth every 8 (eight) hours. 09/23/23   Garrison Kanner, MD  hydrOXYzine  (ATARAX ) 25 MG tablet Take 1 tablet (25 mg total) by mouth every 8 (eight) hours as needed (home med). 09/23/23   Garrison Kanner, MD  irbesartan  (AVAPRO ) 300 MG tablet Take 1 tablet (300 mg total) by mouth every evening. 09/23/23   Garrison Kanner, MD  isosorbide  mononitrate (IMDUR ) 120 MG 24 hr  tablet Take 120 mg by mouth daily.    [provider]  labetalol  (NORMODYNE ) 300 MG tablet Take 2 tablets (600 mg total) by mouth 2 (two) times daily. 09/23/23   Garrison Kanner, MD  nitroGLYCERIN (NITROSTAT) 0.4 MG SL tablet Place under the tongue. 05/29/23   [provider]  pantoprazole  (PROTONIX ) 40 MG tablet Take 1 tablet (40 mg total) by mouth daily. 09/24/23   Garrison Kanner, MD  polyethylene glycol (MIRALAX  / GLYCOLAX ) 17 g  packet Take 17 g by mouth daily as needed. 09/23/23   Garrison Kanner, MD  senna-docusate (SENOKOT-S) 8.6-50 MG tablet Take 2 tablets by mouth 2 (two) times daily as needed for mild constipation. 09/23/23   Garrison Kanner, MD  sevelamer  carbonate (RENVELA ) 800 MG tablet Take 1,600 mg by mouth 3 (three) times daily.    [provider]  terazosin  (HYTRIN ) 2 MG capsule Take 2 mg by mouth 2 (two) times daily.    [provider]    Physical Exam: Vitals:   10/25/23 0230 10/25/23 0300 10/25/23 0321 10/25/23 0353  BP: (!) 133/96   (!) 136/92  Pulse: 89 89  86  Resp: (!) 26 17  16   Temp:   97.7 F (36.5 C) (!) 97.4 F (36.3 C)  TempSrc:   Oral Oral  SpO2: 100% 99%  100%  Weight:    75.8 kg  Height:    5' 3.5" (1.613 m)   Physical Exam Vitals and nursing note reviewed.  Constitutional:      General: She is not in acute distress.    Comments: Patient drowsy from medication administered in the ED, arousable but will readily fall back asleep  HENT:     Head: Normocephalic and atraumatic.  Cardiovascular:     Rate and Rhythm: Normal rate and regular rhythm.     Heart sounds: Normal heart sounds.  Pulmonary:     Effort: Pulmonary effort is normal.     Breath sounds: Normal breath sounds.  Abdominal:     Palpations: Abdomen is soft.     Tenderness: There is no abdominal tenderness.  Neurological:     General: No focal deficit present.     Data Reviewed: Relevant notes from primary care and specialist visits, past discharge summaries as available in EHR, including Care Everywhere. Prior diagnostic testing as pertinent to current admission diagnoses Updated medications and problem lists for reconciliation ED course, including vitals, labs, imaging, treatment and response to treatment Triage notes, nursing and pharmacy notes and ED provider's notes Notable results as noted in HPI   Assessment and Plan: * Peripheral vertigo Right mastoid effusion Meclizine as needed, with  Reglan or Compazine for breakthrough If recalcitrant can consider dexamethasone Can consider ENT referral  HTN  -Resume home BP meds including amlodipine , losartan , Imdur  and hydralazine    ESRD on HD Anemia of CKD -At baseline Nephrology consult for continuation of dialysis    Advance Care Planning:   Code Status: Prior   Consults: nephrology  Family Communication: none  Severity of Illness: The appropriate patient status for this patient is OBSERVATION. Observation status is judged to be reasonable and necessary in order to provide the required intensity of service to ensure the patient's safety. The patient's presenting symptoms, physical exam findings, and initial radiographic and laboratory data in the context of their medical condition is felt to place them at decreased risk for further clinical deterioration. Furthermore, it is anticipated that the patient will be medically stable for discharge from the  hospital within 2 midnights of admission.   Author: Lanetta Pion, MD 10/25/2023 4:45 AM  For on call review [email or web address].

## 2023-10-25 NOTE — Plan of Care (Signed)
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
## 2023-10-25 NOTE — ED Provider Notes (Signed)
 12:00 AM  Assumed care of patient at shift change.  Patient here with vertigo, headache, possible new focal neurodeficits.  MRI brain obtained which shows no acute abnormality.  Will reassess after Reglan.  Urine pending.    12:57 AM Pt still c/o HA and vertigo.  Will give Compazine, meclizine.  MRI reviewed and interpreted by myself and the radiologist and shows no acute stroke.  Urine shows no sign of infection.  Patient is hypertensive and on multiple medications for this.  I have reordered her nighttime blood pressure medications.   2:30 AM  Pt reports still symptomatic with vertigo and unable to safely ambulate.  Headache has resolved.  Blood pressure improving slowly.  Will discuss with hospitalist for admission for symptomatic vertigo, hypertension.  2:43 AM  Consulted and discussed patient's case with hospitalist, Dr. Vallarie Gauze.  I have recommended admission and consulting physician agrees and will place admission orders.  Patient (and family if present) agree with this plan.   I reviewed all nursing notes, vitals, pertinent previous records.  All labs, EKGs, imaging ordered have been independently reviewed and interpreted by myself.    Nyquan Selbe, Clover Dao, DO 10/25/23 [phone]

## 2023-10-25 NOTE — Progress Notes (Signed)
 [facility]  - [place] [facility]   PATIENT NAME: Joeli Fenner    MR#:  [id number]  DATE OF BIRTH:  May 16, 1971  SUBJECTIVE:  no family at bedside. Patient tells me dizziness is a bit better. She took meclizine earlier and is feeling sleepy. Tolerating PO diet. Had some upper respiratory issue few days ago. Denies drinking ER or headache.    VITALS:  Blood pressure (!) 152/99, pulse 92, temperature 98 F (36.7 C), temperature source Oral, resp. rate 16, height 5' 3.5" (1.613 m), weight 75.8 kg, SpO2 99%.  PHYSICAL EXAMINATION:   GENERAL:  53 y.o.-year-old patient with no acute distress.  LUNGS: Normal breath sounds bilaterally, no wheezing CARDIOVASCULAR: S1, S2 normal. No murmur   ABDOMEN: Soft, nontender, nondistended. Bowel sounds present.  EXTREMITIES: No  edema b/l. HD access   NEUROLOGIC: nonfocal  patient is alert and awake  LABORATORY PANEL:  CBC Recent Labs  Lab 10/24/23 2208  WBC 6.6  HGB 9.1*  HCT 28.1*  PLT 160    Chemistries  Recent Labs  Lab 10/24/23 2208  NA 140  K 4.7  CL 102  CO2 24  GLUCOSE 106*  BUN 94*  CREATININE 7.68*  CALCIUM  8.2*  AST 27  ALT 16  ALKPHOS 277*  BILITOT 0.4   Cardiac Enzymes No results for input(s): "TROPONINI" in the last 168 hours. RADIOLOGY:  MR BRAIN WO CONTRAST Result Date: 10/24/2023 CLINICAL DATA:  Neuro deficit, acute, stroke suspected. EXAM: MRI HEAD WITHOUT CONTRAST TECHNIQUE: Multiplanar, multiecho pulse sequences of the brain and surrounding structures were obtained without intravenous contrast. COMPARISON:  CT head from earlier today. FINDINGS: Brain: Remote lacunar infarcts in bilateral basal ganglia, thalami and pons. Additional remote infarct in the posterior right temporal/occipital region. Small remote cerebellar infarcts. Patchy T2/FLAIR hyperintensities in the white matter, compatible with chronic microvascular ischemic disease. No evidence of acute infarct, acute hemorrhage,  mass lesion or midline shift. Many foci of susceptibility artifact in the thalami, basal ganglia, brainstem and cerebellum, compatible with chronic microhemorrhages. Vascular: Major arterial flow voids are maintained skull base. Skull and upper cervical spine: Normal marrow signal. Sinuses/Orbits: Clear sinuses.  No acute findings. Other: Right mastoid effusion. IMPRESSION: 1. No evidence of acute intracranial abnormality. 2. Remote infarcts and chronic microvascular ischemic disease. 3. Many chronic microhemorrhages, likely due to longstanding hypertension. 4. Right mastoid effusion. Electronically Signed   By: Stevenson Elbe M.D.   On: 10/24/2023 23:21   DG Chest 1 View Result Date: 10/24/2023 CLINICAL DATA:  Fatigue, headache EXAM: CHEST  1 VIEW COMPARISON:  08/09/2023 FINDINGS: Single frontal view of the chest demonstrates stable enlargement of the cardiac silhouette. Right internal jugular dialysis catheter unchanged. No acute airspace disease, effusion, or pneumothorax. No acute bony abnormalities. IMPRESSION: 1. Stable enlarged cardiac silhouette. 2. No acute airspace disease. Electronically Signed   By: Bobbye Burrow M.D.   On: 10/24/2023 22:41   CT HEAD WO CONTRAST Result Date: 10/24/2023 CLINICAL DATA:  Headache, photophobia, blurred vision EXAM: CT HEAD WITHOUT CONTRAST TECHNIQUE: Contiguous axial images were obtained from the base of the skull through the vertex without intravenous contrast. RADIATION DOSE REDUCTION: This exam was performed according to the departmental dose-optimization program which includes automated exposure control, adjustment of the mA and/or kV according to patient size and/or use of iterative reconstruction technique. COMPARISON:  09/11/2023 FINDINGS: Brain: Stable chronic small-vessel ischemic changes within the basal ganglia, thalami, and periventricular white matter. No evidence of acute infarct or hemorrhage. Lateral ventricles  and remaining midline structures are  unremarkable. No acute extra-axial fluid collections. No mass effect. Vascular: No hyperdense vessel or unexpected calcification. Skull: Normal. Negative for fracture or focal lesion. Sinuses/Orbits: Stable right mastoid effusion. Remaining paranasal sinuses are clear. Other: None. IMPRESSION: 1. Stable head CT, no acute intracranial process. 2. Stable right mastoid effusion. Electronically Signed   By: Bobbye Burrow M.D.   On: 10/24/2023 22:05    Assessment and Plan  Katrin Grabel is a 53 y.o. female with medical history significant of ESRD on HD MWF, uncontrolled HTN, chronic HFpEF, chronic iron deficiency anemia , being admitted for intractable peripheral vertigo not responding to treatment in the ED, ruled out for central vertigo with MRI   Chest x-ray was nonacute  MRI brain showed remote infarcts and chronic microvascular ischemic disease with many chronic microhemorrhages but no evidence of acute intracranial abnormality. It did show a right mastoid effusion   Peripheral vertigo Right mastoid effusion --Meclizine as needed, with Reglan or Compazine for breakthrough --Can consider ENT referral as outpatient -- patient overall feels a bit better. She is tolerating PO diet.   HTN  -Resume home BP meds    ESRD on HD Anemia of CKD -At baseline Nephrology consult for continuation of dialysis  TOC for discharge planning back to [facility]. Patient is a long-term resident  Family communication :none today Consults : nephrology CODE STATUS: full DVT Prophylaxis : current Level of care: Med-Surg Status is: Observation The patient remains OBS appropriate and will d/c before 2 midnights.    TOTAL TIME TAKING CARE OF THIS PATIENT: 35 minutes.  >50% time spent on counselling and coordination of care  Note: This dictation was prepared with Dragon dictation along with smaller phrase technology. Any transcriptional errors that result from this process are unintentional.  Melvinia Stager M.D    [facility]   CC: Primary care physician; Valerie Gather, MD

## 2023-10-25 NOTE — Assessment & Plan Note (Signed)
 Right mastoid effusion Meclizine as needed, with Reglan or Compazine for breakthrough If recalcitrant can consider dexamethasone Can consider ENT referral

## 2023-10-25 NOTE — ED Notes (Signed)
 Pt became dizzy when standing. Took a few steps with full assistance, was dragging Left foot

## 2023-10-25 NOTE — Progress Notes (Signed)
 [facility]  ROUNDING NOTE   Subjective:  Patient receives outpatient dialysis [facility] [place]/MTWF/PC followed by [facility] providers. Patient sitting at side of the bed. Patient endorses good appetite and no bowel and bladder issue. Denies shortness of breath. Patient acknowledges dialysis scheduling for tomorrow.    Objective:  Vital signs in last 24 hours:  Temp:  [97.4 F (36.3 C)-98 F (36.7 C)] 98 F (36.7 C) (05/25 0829) Pulse Rate:  [85-92] 92 (05/25 0829) Resp:  [11-26] 16 (05/25 0829) BP: (133-185)/(92-117) 152/99 (05/25 0829) SpO2:  [99 %-100 %] 99 % (05/25 0829) Weight:  [75.8 kg] 75.8 kg (05/25 0353)  Weight change:  Filed Weights   10/25/23 0353  Weight: 75.8 kg    Intake/Output: No intake/output data recorded.   Intake/Output this shift:  Total I/O In: 240 [P.O.:240] Out: -   Physical Exam: General: NAD,   Head: Normocephalic, atraumatic. Moist oral mucosal membranes  Eyes: Anicteric, PERRL  Neck: Supple, trachea midline  Lungs:  On room air, trace crackles to auscultation  Heart: Regular rate and rhythm  Abdomen:  Soft, nontender,   Extremities:  Trace BLL peripheral edema.  Neurologic: Nonfocal, moving all four extremities  Skin: No lesions  Access: Rt chest permcah    Basic Metabolic Panel: Recent Labs  Lab 10/24/23 2208  NA 140  K 4.7  CL 102  CO2 24  GLUCOSE 106*  BUN 94*  CREATININE 7.68*  CALCIUM  8.2*    Liver Function Tests: Recent Labs  Lab 10/24/23 2208  AST 27  ALT 16  ALKPHOS 277*  BILITOT 0.4  PROT 7.4  ALBUMIN 3.9   No results for input(s): "LIPASE", "AMYLASE" in the last 168 hours. No results for input(s): "AMMONIA" in the last 168 hours.  CBC: Recent Labs  Lab 10/24/23 2208  WBC 6.6  NEUTROABS 4.5  HGB 9.1*  HCT 28.1*  MCV 83.1  PLT 160    Cardiac Enzymes: No results for input(s): "CKTOTAL", "CKMB", "CKMBINDEX", "TROPONINI" in the last 168 hours.  BNP: Invalid input(s): "POCBNP"  CBG: No  results for input(s): "GLUCAP" in the last 168 hours.  Microbiology: Results for orders placed or performed during the hospital encounter of 08/09/23  Resp panel by RT-PCR (RSV, Flu A&B, Covid) Anterior Nasal Swab     Status: None   Collection Time: 08/14/23  3:44 AM   Specimen: Anterior Nasal Swab  Result Value Ref Range Status   SARS Coronavirus 2 by RT PCR NEGATIVE NEGATIVE Final    Comment: (NOTE) SARS-CoV-2 target nucleic acids are NOT DETECTED.  The SARS-CoV-2 RNA is generally detectable in upper respiratory specimens during the acute phase of infection. The lowest concentration of SARS-CoV-2 viral copies this assay can detect is 138 copies/mL. A negative result does not preclude SARS-Cov-2 infection and should not be used as the sole basis for treatment or other patient management decisions. A negative result may occur with  improper specimen collection/handling, submission of specimen other than nasopharyngeal swab, presence of viral mutation(s) within the areas targeted by this assay, and inadequate number of viral copies(<138 copies/mL). A negative result must be combined with clinical observations, patient history, and epidemiological information. The expected result is Negative.  Fact Sheet for Patients:  BloggerCourse.com  Fact Sheet for Healthcare Providers:  SeriousBroker.it  This test is no t yet approved or cleared by the [place]  FDA and  has been authorized for detection and/or diagnosis of SARS-CoV-2 by FDA under an Emergency Use Authorization (EUA). This EUA will  remain  in effect (meaning this test can be used) for the duration of the COVID-19 declaration under Section 564(b)(1) of the Act, 21 U.S.C.section 360bbb-3(b)(1), unless the authorization is terminated  or revoked sooner.       Influenza A by PCR NEGATIVE NEGATIVE Final   Influenza B by PCR NEGATIVE NEGATIVE Final    Comment: (NOTE) The  Xpert Xpress SARS-CoV-2/FLU/RSV plus assay is intended as an aid in the diagnosis of influenza from Nasopharyngeal swab specimens and should not be used as a sole basis for treatment. Nasal washings and aspirates are unacceptable for Xpert Xpress SARS-CoV-2/FLU/RSV testing.  Fact Sheet for Patients: BloggerCourse.com  Fact Sheet for Healthcare Providers: SeriousBroker.it  This test is not yet approved or cleared by the [place]  FDA and has been authorized for detection and/or diagnosis of SARS-CoV-2 by FDA under an Emergency Use Authorization (EUA). This EUA will remain in effect (meaning this test can be used) for the duration of the COVID-19 declaration under Section 564(b)(1) of the Act, 21 U.S.C. section 360bbb-3(b)(1), unless the authorization is terminated or revoked.     Resp Syncytial Virus by PCR NEGATIVE NEGATIVE Final    Comment: (NOTE) Fact Sheet for Patients: BloggerCourse.com  Fact Sheet for Healthcare Providers: SeriousBroker.it  This test is not yet approved or cleared by the [place]  FDA and has been authorized for detection and/or diagnosis of SARS-CoV-2 by FDA under an Emergency Use Authorization (EUA). This EUA will remain in effect (meaning this test can be used) for the duration of the COVID-19 declaration under Section 564(b)(1) of the Act, 21 U.S.C. section 360bbb-3(b)(1), unless the authorization is terminated or revoked.  Performed at [facility], [address] [place], [place] [address]   Respiratory (~20 pathogens) panel by PCR     Status: None   Collection Time: 08/14/23  3:44 AM   Specimen: Nasopharyngeal Swab; Respiratory  Result Value Ref Range Status   Adenovirus NOT DETECTED NOT DETECTED Final   Coronavirus 229E NOT DETECTED NOT DETECTED Final    Comment: (NOTE) The Coronavirus on the Respiratory Panel, DOES NOT test  for the novel  Coronavirus (2019 nCoV)    Coronavirus HKU1 NOT DETECTED NOT DETECTED Final   Coronavirus NL63 NOT DETECTED NOT DETECTED Final   Coronavirus OC43 NOT DETECTED NOT DETECTED Final   Metapneumovirus NOT DETECTED NOT DETECTED Final   Rhinovirus / Enterovirus NOT DETECTED NOT DETECTED Final   Influenza A NOT DETECTED NOT DETECTED Final   Influenza B NOT DETECTED NOT DETECTED Final   Parainfluenza Virus 1 NOT DETECTED NOT DETECTED Final   Parainfluenza Virus 2 NOT DETECTED NOT DETECTED Final   Parainfluenza Virus 3 NOT DETECTED NOT DETECTED Final   Parainfluenza Virus 4 NOT DETECTED NOT DETECTED Final   Respiratory Syncytial Virus NOT DETECTED NOT DETECTED Final   Bordetella pertussis NOT DETECTED NOT DETECTED Final   Bordetella Parapertussis NOT DETECTED NOT DETECTED Final   Chlamydophila pneumoniae NOT DETECTED NOT DETECTED Final   Mycoplasma pneumoniae NOT DETECTED NOT DETECTED Final    Comment: Performed at [facility], [address] [place], [place] [address]  Culture, blood (Routine X 2) w Reflex to ID Panel     Status: None   Collection Time: 08/14/23  9:19 AM   Specimen: BLOOD  Result Value Ref Range Status   Specimen Description BLOOD BLOOD RIGHT HAND  Final   Special Requests   Final    BOTTLES DRAWN AEROBIC AND ANAEROBIC Blood Culture adequate volume  Culture   Final    NO GROWTH 5 DAYS Performed at [facility], [address] [place], [place] [address]    Report Status 08/19/2023 FINAL  Final  Culture, blood (Routine X 2) w Reflex to ID Panel     Status: None   Collection Time: 08/14/23  9:19 AM   Specimen: BLOOD  Result Value Ref Range Status   Specimen Description BLOOD BLOOD LEFT HAND  Final   Special Requests   Final    BOTTLES DRAWN AEROBIC AND ANAEROBIC Blood Culture adequate volume   Culture   Final    NO GROWTH 5 DAYS Performed at [facility], [address] [place], [place] [address]    Report Status 08/19/2023  FINAL  Final  Ear culture     Status: None   Collection Time: 08/14/23 10:11 AM   Specimen: Ear; Other  Result Value Ref Range Status   Specimen Description   Final    EAR Performed at [facility], [address] [place], [place] [address]    Special Requests   Final    RIGHT EAR Performed at [facility], [address] [place], [place] [address]    Culture RARE STREPTOCOCCUS PNEUMONIAE  Final   Report Status 08/16/2023 FINAL  Final   Organism ID, Bacteria STREPTOCOCCUS PNEUMONIAE  Final      Susceptibility   Streptococcus pneumoniae - MIC*    ERYTHROMYCIN <=0.12 SENSITIVE Sensitive     LEVOFLOXACIN  1 SENSITIVE Sensitive     VANCOMYCIN 0.5 SENSITIVE Sensitive     PENICILLIN (meningitis) <=0.06 SENSITIVE Sensitive     PENO - penicillin <=0.06      PENICILLIN (non-meningitis) <=0.06 SENSITIVE Sensitive     PENICILLIN (oral) <=0.06 SENSITIVE Sensitive     CEFTRIAXONE (non-meningitis) <=0.12 SENSITIVE Sensitive     CEFTRIAXONE (meningitis) <=0.12 SENSITIVE Sensitive     * RARE STREPTOCOCCUS PNEUMONIAE    Coagulation Studies: Recent Labs    10/24/23 11-03-06  LABPROT 13.1  INR 1.0    Urinalysis: Recent Labs    10/24/23 2356  COLORURINE YELLOW*  LABSPEC 1.013  PHURINE 7.0  GLUCOSEU NEGATIVE  HGBUR NEGATIVE  BILIRUBINUR NEGATIVE  KETONESUR NEGATIVE  PROTEINUR 100*  NITRITE NEGATIVE  LEUKOCYTESUR NEGATIVE      Imaging: MR BRAIN WO CONTRAST Result Date: 10/24/2023 CLINICAL DATA:  Neuro deficit, acute, stroke suspected. EXAM: MRI HEAD WITHOUT CONTRAST TECHNIQUE: Multiplanar, multiecho pulse sequences of the brain and surrounding structures were obtained without intravenous contrast. COMPARISON:  CT head from earlier today. FINDINGS: Brain: Remote lacunar infarcts in bilateral basal ganglia, thalami and pons. Additional remote infarct in the posterior right temporal/occipital region. Small remote cerebellar infarcts. Patchy T2/FLAIR hyperintensities  in the white matter, compatible with chronic microvascular ischemic disease. No evidence of acute infarct, acute hemorrhage, mass lesion or midline shift. Many foci of susceptibility artifact in the thalami, basal ganglia, brainstem and cerebellum, compatible with chronic microhemorrhages. Vascular: Major arterial flow voids are maintained skull base. Skull and upper cervical spine: Normal marrow signal. Sinuses/Orbits: Clear sinuses.  No acute findings. Other: Right mastoid effusion. IMPRESSION: 1. No evidence of acute intracranial abnormality. 2. Remote infarcts and chronic microvascular ischemic disease. 3. Many chronic microhemorrhages, likely due to longstanding hypertension. 4. Right mastoid effusion. Electronically Signed   By: Stevenson Elbe M.D.   On: 10/24/2023 23:21   DG Chest 1 View Result Date: 10/24/2023 CLINICAL DATA:  Fatigue, headache EXAM: CHEST  1 VIEW COMPARISON:  08/09/2023 FINDINGS: Single  frontal view of the chest demonstrates stable enlargement of the cardiac silhouette. Right internal jugular dialysis catheter unchanged. No acute airspace disease, effusion, or pneumothorax. No acute bony abnormalities. IMPRESSION: 1. Stable enlarged cardiac silhouette. 2. No acute airspace disease. Electronically Signed   By: Bobbye Burrow M.D.   On: 10/24/2023 22:41   CT HEAD WO CONTRAST Result Date: 10/24/2023 CLINICAL DATA:  Headache, photophobia, blurred vision EXAM: CT HEAD WITHOUT CONTRAST TECHNIQUE: Contiguous axial images were obtained from the base of the skull through the vertex without intravenous contrast. RADIATION DOSE REDUCTION: This exam was performed according to the departmental dose-optimization program which includes automated exposure control, adjustment of the mA and/or kV according to patient size and/or use of iterative reconstruction technique. COMPARISON:  09/11/2023 FINDINGS: Brain: Stable chronic small-vessel ischemic changes within the basal ganglia, thalami, and  periventricular white matter. No evidence of acute infarct or hemorrhage. Lateral ventricles and remaining midline structures are unremarkable. No acute extra-axial fluid collections. No mass effect. Vascular: No hyperdense vessel or unexpected calcification. Skull: Normal. Negative for fracture or focal lesion. Sinuses/Orbits: Stable right mastoid effusion. Remaining paranasal sinuses are clear. Other: None. IMPRESSION: 1. Stable head CT, no acute intracranial process. 2. Stable right mastoid effusion. Electronically Signed   By: Bobbye Burrow M.D.   On: 10/24/2023 22:05     Medications:     atorvastatin   40 mg Oral Daily   [START ON 10/27/2023] calcitRIOL   0.25 mcg Oral Q T,Th,Sa-HD   calcium  carbonate  400 mg of elemental calcium  Oral BID   [START ON 10/26/2023] Chlorhexidine  Gluconate Cloth  6 each Topical Q0600   cinacalcet   30 mg Oral Q supper   heparin   5,000 Units Subcutaneous Q8H   hydrALAZINE   75 mg Oral Q8H   [START ON 10/26/2023] irbesartan   300 mg Oral QPM   isosorbide  mononitrate  120 mg Oral Daily   labetalol   600 mg Oral BID   meclizine  25 mg Oral TID   pantoprazole   40 mg Oral Daily   acetaminophen  **OR** acetaminophen , ondansetron  **OR** ondansetron  (ZOFRAN ) IV, mouth rinse, polyethylene glycol, prochlorperazine  Assessment/ Plan:  [name] is a 53 y.o.  female  with past medical conditions including hypertension, CHF, anemia, and end-stage renal disease on hemodialysis, who was admitted to [facility] on 10/24/2023 for intractable peripheral vertigo not responding to treatment in the ED. Anemia due to chronic kidney disease, on chronic dialysis (HCC) [N18.6, D63.1, Z99.2]  Patient receives outpatient dialysis [facility] [place]/MTWF/PC followed by [facility] providers.    1.  End stage renal disease on hemodialysis.  Plan for hemodialysis treatment again tomorrow for her usual MTWF for 2-2.5LUF goal as tolerated.    2. Anemia of chronic kidney disease     Hemoglobin 12.0 -  15.0 g/dL 9.1 (L) 1/47/82 95:62  HCT 36.0 - 46.0 % 28.1 (L) 10/24/23 22:08  (L): Data is abnormally low Will receive low dose ESA with dialysis.   3.  Hypertension of chronic disease             Blood pressure currently 152/99.  Maintain the patient on hydralazine , irbesartan , isosorbide , and labetalol .    4. Secondary Hyperparathyroidism:   Recent Labs       Lab Results  Component Value Date    PTH 1,248 (H) 08/11/2023    CALCIUM  8.2 (L) 10/24/2023    PHOS 5.6 (H) 09/22/2023      Currently prescribed calcium  carbonate, calcitriol , Renvela , and cinacalcet .     LOS: 0  Raykwon Hobbs Marisa Sickles 5/25/202511:20 AM

## 2023-10-26 DIAGNOSIS — H81399 Other peripheral vertigo, unspecified ear: Secondary | ICD-10-CM | POA: Diagnosis not present

## 2023-10-26 DIAGNOSIS — I10 Essential (primary) hypertension: Secondary | ICD-10-CM | POA: Diagnosis not present

## 2023-10-26 DIAGNOSIS — Z8673 Personal history of transient ischemic attack (TIA), and cerebral infarction without residual deficits: Secondary | ICD-10-CM | POA: Diagnosis not present

## 2023-10-26 LAB — CBC WITH DIFFERENTIAL/PLATELET
Abs Immature Granulocytes: 0.02 10*3/uL (ref 0.00–0.07)
Basophils Absolute: 0 10*3/uL (ref 0.0–0.1)
Basophils Relative: 0 %
Eosinophils Absolute: 0.2 10*3/uL (ref 0.0–0.5)
Eosinophils Relative: 3 %
HCT: 28 % — ABNORMAL LOW (ref 36.0–46.0)
Hemoglobin: 8.9 g/dL — ABNORMAL LOW (ref 12.0–15.0)
Immature Granulocytes: 0 %
Lymphocytes Relative: 22 %
Lymphs Abs: 1.5 10*3/uL (ref 0.7–4.0)
MCH: 26.6 pg (ref 26.0–34.0)
MCHC: 31.8 g/dL (ref 30.0–36.0)
MCV: 83.6 fL (ref 80.0–100.0)
Monocytes Absolute: 0.5 10*3/uL (ref 0.1–1.0)
Monocytes Relative: 8 %
Neutro Abs: 4.5 10*3/uL (ref 1.7–7.7)
Neutrophils Relative %: 67 %
Platelets: 148 10*3/uL — ABNORMAL LOW (ref 150–400)
RBC: 3.35 MIL/uL — ABNORMAL LOW (ref 3.87–5.11)
RDW: 17.2 % — ABNORMAL HIGH (ref 11.5–15.5)
WBC: 6.8 10*3/uL (ref 4.0–10.5)
nRBC: 0 % (ref 0.0–0.2)

## 2023-10-26 LAB — RENAL FUNCTION PANEL
Albumin: 3.6 g/dL (ref 3.5–5.0)
Anion gap: 14 (ref 5–15)
BUN: 87 mg/dL — ABNORMAL HIGH (ref 6–20)
CO2: 23 mmol/L (ref 22–32)
Calcium: 7.9 mg/dL — ABNORMAL LOW (ref 8.9–10.3)
Chloride: 106 mmol/L (ref 98–111)
Creatinine, Ser: 8.89 mg/dL — ABNORMAL HIGH (ref 0.44–1.00)
GFR, Estimated: 5 mL/min — ABNORMAL LOW (ref >60)
Glucose, Bld: 101 mg/dL — ABNORMAL HIGH (ref 70–99)
Phosphorus: 5.2 mg/dL — ABNORMAL HIGH (ref 2.5–4.6)
Potassium: 4.4 mmol/L (ref 3.5–5.1)
Sodium: 143 mmol/L (ref 135–145)

## 2023-10-26 MED ORDER — EPOETIN ALFA-EPBX 4000 UNIT/ML IJ SOLN
4000.0000 [IU] | Freq: Once | INTRAMUSCULAR | Status: AC
Start: 2023-10-26 — End: 2023-10-26
  Administered 2023-10-26: 4000 [IU] via SUBCUTANEOUS

## 2023-10-26 MED ORDER — EPOETIN ALFA-EPBX 4000 UNIT/ML IJ SOLN: 4000.0000 [IU] | INTRAMUSCULAR | Status: DC

## 2023-10-26 MED ORDER — HYDRALAZINE HCL 50 MG PO TABS
100.0000 mg | ORAL_TABLET | Freq: Three times a day (TID) | ORAL | Status: DC
  Administered 2023-10-26 – 2023-10-27 (×3): 100 mg via ORAL
  Filled 2023-10-26 (×3): qty 2

## 2023-10-26 NOTE — Progress Notes (Signed)
 [facility]  - [place] [facility]   PATIENT NAME: Allison Hill    MR#:  [id number]  DATE OF BIRTH:  March 05, 1971  SUBJECTIVE:  no family at bedside. Going for dialysis. Had bowel movement yesterday per patient and nurse. No other issues. Dizziness improving.     VITALS:  Blood pressure (!) 186/110, pulse 87, temperature 97.8 F (36.6 C), temperature source Oral, resp. rate 18, height 5' 3.5" (1.613 m), weight 73.9 kg, SpO2 99%.  PHYSICAL EXAMINATION:   GENERAL:  53 y.o.-year-old patient with no acute distress.  LUNGS: Normal breath sounds bilaterally, no wheezing CARDIOVASCULAR: S1, S2 normal. No murmur   ABDOMEN: Soft, nontender, nondistended. Bowel sounds present.  EXTREMITIES: No  edema b/l. HD access   NEUROLOGIC: nonfocal  patient is alert and awake  LABORATORY PANEL:  CBC Recent Labs  Lab 10/26/23 0925  WBC 6.8  HGB 8.9*  HCT 28.0*  PLT 148*    Chemistries  Recent Labs  Lab 10/24/23 2208 10/26/23 0925  NA 140 143  K 4.7 4.4  CL 102 106  CO2 24 23  GLUCOSE 106* 101*  BUN 94* 87*  CREATININE 7.68* 8.89*  CALCIUM  8.2* 7.9*  AST 27  --   ALT 16  --   ALKPHOS 277*  --   BILITOT 0.4  --    Cardiac Enzymes No results for input(s): "TROPONINI" in the last 168 hours. RADIOLOGY:  MR BRAIN WO CONTRAST Result Date: 10/24/2023 CLINICAL DATA:  Neuro deficit, acute, stroke suspected. EXAM: MRI HEAD WITHOUT CONTRAST TECHNIQUE: Multiplanar, multiecho pulse sequences of the brain and surrounding structures were obtained without intravenous contrast. COMPARISON:  CT head from earlier today. FINDINGS: Brain: Remote lacunar infarcts in bilateral basal ganglia, thalami and pons. Additional remote infarct in the posterior right temporal/occipital region. Small remote cerebellar infarcts. Patchy T2/FLAIR hyperintensities in the white matter, compatible with chronic microvascular ischemic disease. No evidence of acute infarct, acute hemorrhage, mass lesion  or midline shift. Many foci of susceptibility artifact in the thalami, basal ganglia, brainstem and cerebellum, compatible with chronic microhemorrhages. Vascular: Major arterial flow voids are maintained skull base. Skull and upper cervical spine: Normal marrow signal. Sinuses/Orbits: Clear sinuses.  No acute findings. Other: Right mastoid effusion. IMPRESSION: 1. No evidence of acute intracranial abnormality. 2. Remote infarcts and chronic microvascular ischemic disease. 3. Many chronic microhemorrhages, likely due to longstanding hypertension. 4. Right mastoid effusion. Electronically Signed   By: Stevenson Elbe M.D.   On: 10/24/2023 23:21   DG Chest 1 View Result Date: 10/24/2023 CLINICAL DATA:  Fatigue, headache EXAM: CHEST  1 VIEW COMPARISON:  08/09/2023 FINDINGS: Single frontal view of the chest demonstrates stable enlargement of the cardiac silhouette. Right internal jugular dialysis catheter unchanged. No acute airspace disease, effusion, or pneumothorax. No acute bony abnormalities. IMPRESSION: 1. Stable enlarged cardiac silhouette. 2. No acute airspace disease. Electronically Signed   By: Bobbye Burrow M.D.   On: 10/24/2023 22:41   CT HEAD WO CONTRAST Result Date: 10/24/2023 CLINICAL DATA:  Headache, photophobia, blurred vision EXAM: CT HEAD WITHOUT CONTRAST TECHNIQUE: Contiguous axial images were obtained from the base of the skull through the vertex without intravenous contrast. RADIATION DOSE REDUCTION: This exam was performed according to the departmental dose-optimization program which includes automated exposure control, adjustment of the mA and/or kV according to patient size and/or use of iterative reconstruction technique. COMPARISON:  09/11/2023 FINDINGS: Brain: Stable chronic small-vessel ischemic changes within the basal ganglia, thalami, and periventricular white matter. No evidence  of acute infarct or hemorrhage. Lateral ventricles and remaining midline structures are unremarkable.  No acute extra-axial fluid collections. No mass effect. Vascular: No hyperdense vessel or unexpected calcification. Skull: Normal. Negative for fracture or focal lesion. Sinuses/Orbits: Stable right mastoid effusion. Remaining paranasal sinuses are clear. Other: None. IMPRESSION: 1. Stable head CT, no acute intracranial process. 2. Stable right mastoid effusion. Electronically Signed   By: Bobbye Burrow M.D.   On: 10/24/2023 22:05    Assessment and Plan  Allison Hill is a 53 y.o. female with medical history significant of ESRD on HD MWF, uncontrolled HTN, chronic HFpEF, chronic iron deficiency anemia , being admitted for intractable peripheral vertigo not responding to treatment in the ED, ruled out for central vertigo with MRI   Chest x-ray was nonacute  MRI brain showed remote infarcts and chronic microvascular ischemic disease with many chronic microhemorrhages but no evidence of acute intracranial abnormality. It did show a right mastoid effusion   Peripheral vertigo Right mastoid effusion --Meclizine as needed, with Reglan or Compazine for breakthrough --Can consider ENT referral as outpatient -- patient overall feels a bit better. She is tolerating PO diet.   HTN  -Resume home BP meds    ESRD on HD Anemia of CKD -At baseline Nephrology consult for continuation of dialysis  TOC for discharge planning back to [facility]. Patient is a long-term resident  Family communication :none today Consults : nephrology CODE STATUS: full DVT Prophylaxis : current Level of care: Med-Surg Status is: Observation The patient remains OBS appropriate and will d/c before 2 midnights.    TOTAL TIME TAKING CARE OF THIS PATIENT: 35 minutes.  >50% time spent on counselling and coordination of care  Note: This dictation was prepared with Dragon dictation along with smaller phrase technology. Any transcriptional errors that result from this process are unintentional.  Melvinia Stager M.D     [facility]   CC: Primary care physician; Valerie Gather, MD

## 2023-10-26 NOTE — Care Management Obs Status (Signed)
 MEDICARE OBSERVATION STATUS NOTIFICATION   Patient Details  Name: Allison Hill MRN: [id number] Date of Birth: [birth year]   Medicare Observation Status Notification Given:  Yes    Sabastian Raimondi W, CMA 10/26/2023, 11:50 AM

## 2023-10-26 NOTE — Progress Notes (Signed)
 [facility]  ROUNDING NOTE   Subjective:   Seen and examined on hemodialysis treatment. Tolerating treatment well. Laying in bed.     HEMODIALYSIS FLOWSHEET:  Blood Flow Rate (mL/min): 399 mL/min Arterial Pressure (mmHg): -191.3 mmHg Venous Pressure (mmHg): 181.41 mmHg TMP (mmHg): 12.12 mmHg Ultrafiltration Rate (mL/min): 971 mL/min Dialysate Flow Rate (mL/min): 299 ml/min    Objective:  Vital signs in last 24 hours:  Temp:  [97.7 F (36.5 C)-98.1 F (36.7 C)] 97.8 F (36.6 C) (05/26 0904) Pulse Rate:  [84-97] 84 (05/26 1000) Resp:  [11-26] 11 (05/26 1000) BP: (142-188)/(92-125) 188/121 (05/26 1000) SpO2:  [97 %-100 %] 100 % (05/26 1000) Weight:  [73.9 kg] 73.9 kg (05/26 0904)  Weight change:  Filed Weights   10/25/23 0353 10/26/23 0904  Weight: 75.8 kg 73.9 kg    Intake/Output: I/O last 3 completed shifts: In: 240 [P.O.:240] Out: -    Intake/Output this shift:  No intake/output data recorded.  Physical Exam: General: NAD, laying in bed  Head: Normocephalic, atraumatic. Moist oral mucosal membranes  Eyes: Anicteric, PERRL  Neck: Supple, trachea midline  Lungs:  Crackles   Heart: Regular rate and rhythm  Abdomen:  Soft, nontender  Extremities:  +peripheral edema.  Neurologic: Nonfocal, moving all four extremities  Skin: No lesions  Access: Rt chest permcath    Basic Metabolic Panel: Recent Labs  Lab 10/24/23 2208 10/26/23 0925  NA 140 143  K 4.7 4.4  CL 102 106  CO2 24 23  GLUCOSE 106* 101*  BUN 94* 87*  CREATININE 7.68* 8.89*  CALCIUM  8.2* 7.9*  PHOS  --  5.2*    Liver Function Tests: Recent Labs  Lab 10/24/23 2208 10/26/23 0925  AST 27  --   ALT 16  --   ALKPHOS 277*  --   BILITOT 0.4  --   PROT 7.4  --   ALBUMIN 3.9 3.6   No results for input(s): "LIPASE", "AMYLASE" in the last 168 hours. No results for input(s): "AMMONIA" in the last 168 hours.  CBC: Recent Labs  Lab 10/24/23 2208 10/26/23 0925  WBC 6.6 6.8   NEUTROABS 4.5 4.5  HGB 9.1* 8.9*  HCT 28.1* 28.0*  MCV 83.1 83.6  PLT 160 148*    Cardiac Enzymes: No results for input(s): "CKTOTAL", "CKMB", "CKMBINDEX", "TROPONINI" in the last 168 hours.  BNP: Invalid input(s): "POCBNP"  CBG: No results for input(s): "GLUCAP" in the last 168 hours.  Microbiology: Results for orders placed or performed during the hospital encounter of 08/09/23  Resp panel by RT-PCR (RSV, Flu A&B, Covid) Anterior Nasal Swab     Status: None   Collection Time: 08/14/23  3:44 AM   Specimen: Anterior Nasal Swab  Result Value Ref Range Status   SARS Coronavirus 2 by RT PCR NEGATIVE NEGATIVE Final    Comment: (NOTE) SARS-CoV-2 target nucleic acids are NOT DETECTED.  The SARS-CoV-2 RNA is generally detectable in upper respiratory specimens during the acute phase of infection. The lowest concentration of SARS-CoV-2 viral copies this assay can detect is 138 copies/mL. A negative result does not preclude SARS-Cov-2 infection and should not be used as the sole basis for treatment or other patient management decisions. A negative result may occur with  improper specimen collection/handling, submission of specimen other than nasopharyngeal swab, presence of viral mutation(s) within the areas targeted by this assay, and inadequate number of viral copies(<138 copies/mL). A negative result must be combined with clinical observations, patient history, and epidemiological information.  The expected result is Negative.  Fact Sheet for Patients:  BloggerCourse.com  Fact Sheet for Healthcare Providers:  SeriousBroker.it  This test is no t yet approved or cleared by the [place]  FDA and  has been authorized for detection and/or diagnosis of SARS-CoV-2 by FDA under an Emergency Use Authorization (EUA). This EUA will remain  in effect (meaning this test can be used) for the duration of the COVID-19 declaration under  Section 564(b)(1) of the Act, 21 U.S.C.section 360bbb-3(b)(1), unless the authorization is terminated  or revoked sooner.       Influenza A by PCR NEGATIVE NEGATIVE Final   Influenza B by PCR NEGATIVE NEGATIVE Final    Comment: (NOTE) The Xpert Xpress SARS-CoV-2/FLU/RSV plus assay is intended as an aid in the diagnosis of influenza from Nasopharyngeal swab specimens and should not be used as a sole basis for treatment. Nasal washings and aspirates are unacceptable for Xpert Xpress SARS-CoV-2/FLU/RSV testing.  Fact Sheet for Patients: BloggerCourse.com  Fact Sheet for Healthcare Providers: SeriousBroker.it  This test is not yet approved or cleared by the [place]  FDA and has been authorized for detection and/or diagnosis of SARS-CoV-2 by FDA under an Emergency Use Authorization (EUA). This EUA will remain in effect (meaning this test can be used) for the duration of the COVID-19 declaration under Section 564(b)(1) of the Act, 21 U.S.C. section 360bbb-3(b)(1), unless the authorization is terminated or revoked.     Resp Syncytial Virus by PCR NEGATIVE NEGATIVE Final    Comment: (NOTE) Fact Sheet for Patients: BloggerCourse.com  Fact Sheet for Healthcare Providers: SeriousBroker.it  This test is not yet approved or cleared by the [place]  FDA and has been authorized for detection and/or diagnosis of SARS-CoV-2 by FDA under an Emergency Use Authorization (EUA). This EUA will remain in effect (meaning this test can be used) for the duration of the COVID-19 declaration under Section 564(b)(1) of the Act, 21 U.S.C. section 360bbb-3(b)(1), unless the authorization is terminated or revoked.  Performed at [facility], [address] [place], [place] [address]   Respiratory (~20 pathogens) panel by PCR     Status: None   Collection Time: 08/14/23  3:44 AM    Specimen: Nasopharyngeal Swab; Respiratory  Result Value Ref Range Status   Adenovirus NOT DETECTED NOT DETECTED Final   Coronavirus 229E NOT DETECTED NOT DETECTED Final    Comment: (NOTE) The Coronavirus on the Respiratory Panel, DOES NOT test for the novel  Coronavirus (2019 nCoV)    Coronavirus HKU1 NOT DETECTED NOT DETECTED Final   Coronavirus NL63 NOT DETECTED NOT DETECTED Final   Coronavirus OC43 NOT DETECTED NOT DETECTED Final   Metapneumovirus NOT DETECTED NOT DETECTED Final   Rhinovirus / Enterovirus NOT DETECTED NOT DETECTED Final   Influenza A NOT DETECTED NOT DETECTED Final   Influenza B NOT DETECTED NOT DETECTED Final   Parainfluenza Virus 1 NOT DETECTED NOT DETECTED Final   Parainfluenza Virus 2 NOT DETECTED NOT DETECTED Final   Parainfluenza Virus 3 NOT DETECTED NOT DETECTED Final   Parainfluenza Virus 4 NOT DETECTED NOT DETECTED Final   Respiratory Syncytial Virus NOT DETECTED NOT DETECTED Final   Bordetella pertussis NOT DETECTED NOT DETECTED Final   Bordetella Parapertussis NOT DETECTED NOT DETECTED Final   Chlamydophila pneumoniae NOT DETECTED NOT DETECTED Final   Mycoplasma pneumoniae NOT DETECTED NOT DETECTED Final    Comment: Performed at [facility], [address] [place], [place] [address]  Culture, blood (Routine X 2) w  Reflex to ID Panel     Status: None   Collection Time: 08/14/23  9:19 AM   Specimen: BLOOD  Result Value Ref Range Status   Specimen Description BLOOD BLOOD RIGHT HAND  Final   Special Requests   Final    BOTTLES DRAWN AEROBIC AND ANAEROBIC Blood Culture adequate volume   Culture   Final    NO GROWTH 5 DAYS Performed at [facility], [address] [place], [place] [address]    Report Status 08/19/2023 FINAL  Final  Culture, blood (Routine X 2) w Reflex to ID Panel     Status: None   Collection Time: 08/14/23  9:19 AM   Specimen: BLOOD  Result Value Ref Range Status   Specimen Description BLOOD BLOOD LEFT HAND   Final   Special Requests   Final    BOTTLES DRAWN AEROBIC AND ANAEROBIC Blood Culture adequate volume   Culture   Final    NO GROWTH 5 DAYS Performed at [facility], [address] [place], [place] [address]    Report Status 08/19/2023 FINAL  Final  Ear culture     Status: None   Collection Time: 08/14/23 10:11 AM   Specimen: Ear; Other  Result Value Ref Range Status   Specimen Description   Final    EAR Performed at [facility], [address] [place], [place] [address]    Special Requests   Final    RIGHT EAR Performed at [facility], [address] [place], [place] [address]    Culture RARE STREPTOCOCCUS PNEUMONIAE  Final   Report Status 08/16/2023 FINAL  Final   Organism ID, Bacteria STREPTOCOCCUS PNEUMONIAE  Final      Susceptibility   Streptococcus pneumoniae - MIC*    ERYTHROMYCIN <=0.12 SENSITIVE Sensitive     LEVOFLOXACIN  1 SENSITIVE Sensitive     VANCOMYCIN 0.5 SENSITIVE Sensitive     PENICILLIN (meningitis) <=0.06 SENSITIVE Sensitive     PENO - penicillin <=0.06      PENICILLIN (non-meningitis) <=0.06 SENSITIVE Sensitive     PENICILLIN (oral) <=0.06 SENSITIVE Sensitive     CEFTRIAXONE (non-meningitis) <=0.12 SENSITIVE Sensitive     CEFTRIAXONE (meningitis) <=0.12 SENSITIVE Sensitive     * RARE STREPTOCOCCUS PNEUMONIAE    Coagulation Studies: Recent Labs    10/24/23 11-27-06  LABPROT 13.1  INR 1.0    Urinalysis: Recent Labs    10/24/23 2356  COLORURINE YELLOW*  LABSPEC 1.013  PHURINE 7.0  GLUCOSEU NEGATIVE  HGBUR NEGATIVE  BILIRUBINUR NEGATIVE  KETONESUR NEGATIVE  PROTEINUR 100*  NITRITE NEGATIVE  LEUKOCYTESUR NEGATIVE      Imaging: MR BRAIN WO CONTRAST Result Date: 10/24/2023 CLINICAL DATA:  Neuro deficit, acute, stroke suspected. EXAM: MRI HEAD WITHOUT CONTRAST TECHNIQUE: Multiplanar, multiecho pulse sequences of the brain and surrounding structures were obtained without intravenous contrast. COMPARISON:  CT  head from earlier today. FINDINGS: Brain: Remote lacunar infarcts in bilateral basal ganglia, thalami and pons. Additional remote infarct in the posterior right temporal/occipital region. Small remote cerebellar infarcts. Patchy T2/FLAIR hyperintensities in the white matter, compatible with chronic microvascular ischemic disease. No evidence of acute infarct, acute hemorrhage, mass lesion or midline shift. Many foci of susceptibility artifact in the thalami, basal ganglia, brainstem and cerebellum, compatible with chronic microhemorrhages. Vascular: Major arterial flow voids are maintained skull base. Skull and upper cervical spine: Normal marrow signal. Sinuses/Orbits: Clear sinuses.  No acute findings. Other: Right mastoid effusion. IMPRESSION: 1. No evidence of acute intracranial abnormality.  2. Remote infarcts and chronic microvascular ischemic disease. 3. Many chronic microhemorrhages, likely due to longstanding hypertension. 4. Right mastoid effusion. Electronically Signed   By: Stevenson Elbe M.D.   On: 10/24/2023 23:21   DG Chest 1 View Result Date: 10/24/2023 CLINICAL DATA:  Fatigue, headache EXAM: CHEST  1 VIEW COMPARISON:  08/09/2023 FINDINGS: Single frontal view of the chest demonstrates stable enlargement of the cardiac silhouette. Right internal jugular dialysis catheter unchanged. No acute airspace disease, effusion, or pneumothorax. No acute bony abnormalities. IMPRESSION: 1. Stable enlarged cardiac silhouette. 2. No acute airspace disease. Electronically Signed   By: Bobbye Burrow M.D.   On: 10/24/2023 22:41   CT HEAD WO CONTRAST Result Date: 10/24/2023 CLINICAL DATA:  Headache, photophobia, blurred vision EXAM: CT HEAD WITHOUT CONTRAST TECHNIQUE: Contiguous axial images were obtained from the base of the skull through the vertex without intravenous contrast. RADIATION DOSE REDUCTION: This exam was performed according to the departmental dose-optimization program which includes automated  exposure control, adjustment of the mA and/or kV according to patient size and/or use of iterative reconstruction technique. COMPARISON:  09/11/2023 FINDINGS: Brain: Stable chronic small-vessel ischemic changes within the basal ganglia, thalami, and periventricular white matter. No evidence of acute infarct or hemorrhage. Lateral ventricles and remaining midline structures are unremarkable. No acute extra-axial fluid collections. No mass effect. Vascular: No hyperdense vessel or unexpected calcification. Skull: Normal. Negative for fracture or focal lesion. Sinuses/Orbits: Stable right mastoid effusion. Remaining paranasal sinuses are clear. Other: None. IMPRESSION: 1. Stable head CT, no acute intracranial process. 2. Stable right mastoid effusion. Electronically Signed   By: Bobbye Burrow M.D.   On: 10/24/2023 22:05     Medications:     amLODipine   10 mg Oral Daily   aspirin  EC  81 mg Oral Daily   atorvastatin   40 mg Oral Daily   [START ON 10/27/2023] calcitRIOL   0.25 mcg Oral Q T,Th,Sa-HD   calcium  carbonate  400 mg of elemental calcium  Oral BID   Chlorhexidine  Gluconate Cloth  6 each Topical Q0600   cinacalcet   30 mg Oral Q supper   docusate sodium   100 mg Oral BID   epoetin  alfa-epbx (RETACRIT ) injection  4,000 Units Subcutaneous Q M,W,F-1800   gabapentin   200 mg Oral BID   heparin   5,000 Units Subcutaneous Q8H   hydrALAZINE   75 mg Oral Q8H   irbesartan   300 mg Oral QPM   isosorbide  mononitrate  120 mg Oral Daily   labetalol   600 mg Oral BID   meclizine  25 mg Oral TID   pantoprazole   40 mg Oral Daily   sevelamer  carbonate  1,600 mg Oral TID WC   terazosin   2 mg Oral BID   acetaminophen  **OR** acetaminophen , hydrOXYzine , ondansetron  **OR** ondansetron  (ZOFRAN ) IV, mouth rinse, polyethylene glycol, prochlorperazine  Assessment/ Plan:  Allison Hill is a 53 y.o.  female  with end stage renal disease on hemodialysis, hypertension, CHF, anemia, and vertigo who is admitted to [facility]  on 10/24/2023 for Vertigo [R42] Facial asymmetry [Q67.0] History of CVA (cerebrovascular accident) [Z86.73] Acute nonintractable headache, unspecified headache type [R51.9] Hypertension, unspecified type [I10]  [facility] [place]/MTWF/PC      1.  End stage renal disease on hemodialysis. Seen and examined on hemodialysis treatment.  Plan for hemodialysis treatment schedule of MTWF - four times a week as long as inpatient schedule will allow.  Avoid volume overload. Continue fluid and salt restriction     2. Anemia of chronic kidney disease  Hemoglobin 12.0 - 15.0 g/dL 9.1 (L) 7/82/95 62:13  HCT 36.0 - 46.0 % 28.1 (L) 10/24/23 22:08  (L): Data is abnormally low Will receive low dose Epo with dialysis.   3.  Hypertension of chronic disease: 192/114 during examination             - Continue regimen of hydralazine , irbesartan , isosorbide  mononitrate, terazosin , amlodipine , and labetalol .    4. Secondary Hyperparathyroidism:   Recent Labs       Lab Results  Component Value Date    PTH 1,248 (H) 08/11/2023    CALCIUM  8.2 (L) 10/24/2023    PHOS 5.6 (H) 09/22/2023      - Continue calcium  carbonate, calcitriol , Renvela , and cinacalcet .     LOS: 0 Baley Shands 5/26/202510:23 AM

## 2023-10-26 NOTE — Plan of Care (Signed)
?  Problem: Education: ?Goal: Knowledge of General Education information will improve ?Description: Including pain rating scale, medication(s)/side effects and non-pharmacologic comfort measures ?Outcome: Progressing ?  ?Problem: Health Behavior/Discharge Planning: ?Goal: Ability to manage health-related needs will improve ?Outcome: Progressing ?  ?Problem: Clinical Measurements: ?Goal: Ability to maintain clinical measurements within normal limits will improve ?Outcome: Progressing ?Goal: Will remain free from infection ?Outcome: Progressing ?Goal: Diagnostic test results will improve ?Outcome: Progressing ?Goal: Respiratory complications will improve ?Outcome: Progressing ?Goal: Cardiovascular complication will be avoided ?Outcome: Progressing ?  ?Problem: Activity: ?Goal: Risk for activity intolerance will decrease ?Outcome: Progressing ?  ?Problem: Safety: ?Goal: Ability to remain free from injury will improve ?Outcome: Progressing ?  ?

## 2023-10-26 NOTE — Progress Notes (Signed)
   10/26/23 1045  Spiritual Encounters  Type of Visit Initial  Care provided to: Pt not available (Nurse Alease Hunter asked me not to disturb the Pt as she was finally resting after a VERY emotional morning. I left the AD info w/nurse to give to Pt as he feels she is able receive the info. Staff can contact chaplain as needed.)  Conversation partners present during encounter Nurse  Referral source Nurse (RN/NT/LPN)  Reason for visit Advance directives  OnCall Visit Yes  Spiritual Care Plan  Spiritual Care Issues Still Outstanding Referring to oncoming chaplain for further support  Advance Directives (For Healthcare)  Does Patient Have a Medical Advance Directive? No (Unable to assess per Nurse Alease Hunter; Pt having an emotional morning and is resting, left info for Pt to review as she is able.)

## 2023-10-27 DIAGNOSIS — N186 End stage renal disease: Secondary | ICD-10-CM | POA: Diagnosis not present

## 2023-10-27 DIAGNOSIS — Z8673 Personal history of transient ischemic attack (TIA), and cerebral infarction without residual deficits: Secondary | ICD-10-CM | POA: Diagnosis not present

## 2023-10-27 DIAGNOSIS — I10 Essential (primary) hypertension: Secondary | ICD-10-CM | POA: Diagnosis not present

## 2023-10-27 DIAGNOSIS — H81399 Other peripheral vertigo, unspecified ear: Secondary | ICD-10-CM | POA: Diagnosis not present

## 2023-10-27 DIAGNOSIS — Z992 Dependence on renal dialysis: Secondary | ICD-10-CM

## 2023-10-27 MED ORDER — MECLIZINE HCL 25 MG PO TABS: 25.0000 mg | ORAL_TABLET | Freq: Two times a day (BID) | ORAL | 0 refills | Status: DC | PRN

## 2023-10-27 NOTE — Discharge Instructions (Signed)
Resume your hemodialysis as before °

## 2023-10-27 NOTE — TOC Transition Note (Signed)
 Transition of Care [facility]) - Discharge Note   Patient Details  Name: Allison Hill MRN: [id number] Date of Birth: July 16, 1970  [facility] CM/SW Contact:  Elsie Halo, RN Phone Number: 10/27/2023, 11:48 AM   Clinical Narrative:    Patient is medically clear to dc to [facility]. The patient's daughter Allison Hill [phone] is agreeable with the dc plan. [facility] will transport the patient.  Nurse to call report to [facility] [phone]     Final next level of care: Skilled Nursing Facility Barriers to Discharge: Barriers Resolved   Patient Goals and CMS Choice            Discharge Placement                       Discharge Plan and Services Additional resources added to the After Visit Summary for     Discharge Planning Services: CM Consult                                 Social Drivers of Health (SDOH) Interventions SDOH Screenings   Food Insecurity: No Food Insecurity (10/25/2023)  Housing: Low Risk  (10/25/2023)  Transportation Needs: No Transportation Needs (10/25/2023)  Recent Concern: Transportation Needs - Unmet Transportation Needs (08/10/2023)  Utilities: Not At Risk (10/25/2023)  Financial Resource Strain: Patient Declined (01/03/2023)   Received from [facility]  Social Connections: Socially Isolated (10/25/2023)  Tobacco Use: High Risk (10/24/2023)     Readmission Risk Interventions     No data to display

## 2023-10-27 NOTE — Discharge Summary (Signed)
 Physician Discharge Summary   Patient: Allison Hill MRN: [id number] DOB: 04-Jul-1970  Admit date:     10/24/2023  Discharge date: 10/27/23  Discharge Physician: Melvinia Stager   PCP: Valerie Gather, MD   Recommendations at discharge:   resume hemodialysis as before  Discharge Diagnoses: Principal Problem:   Peripheral vertigo Active Problems:   Hypertension   History of CVA (cerebrovascular accident)  Allison Hill is a 53 y.o. female with medical history significant of ESRD on HD MWF, uncontrolled HTN, chronic HFpEF, chronic iron deficiency anemia , being admitted for intractable peripheral vertigo not responding to treatment in the ED, ruled out for central vertigo with MRI    Chest x-ray was nonacute   MRI brain showed remote infarcts and chronic microvascular ischemic disease with many chronic microhemorrhages but no evidence of acute intracranial abnormality. It did show a right mastoid effusion    Peripheral vertigo Right mastoid effusion --Meclizine as needed --Can consider ENT referral as outpatient -- patient overall feels better. She is tolerating PO diet.   HTN ,Malignant -Resume home BP meds    ESRD on HD Anemia of CKD -At baseline Nephrology consult for continuation of dialysis   TOC for discharge planning back to [facility]. Patient is a long-term resident   Family communication :none today Consults : nephrology CODE STATUS: full      Disposition: Long term care facility Diet recommendation:  Discharge Diet Orders (From admission, onward)     Start     Ordered   10/27/23 0000  Diet - low sodium heart healthy        10/27/23 1205           Renal diet DISCHARGE MEDICATION: Allergies as of 10/27/2023       Reactions   Ceftriaxone Shortness Of Breath, Hives   Allergy, hives   Shellfish Allergy Hives   Minoxidil Swelling        Medication List     STOP taking these medications    hydrOXYzine  25 MG tablet Commonly  known as: ATARAX    labetalol  300 MG tablet Commonly known as: NORMODYNE        TAKE these medications    acetaminophen  500 MG tablet Commonly known as: TYLENOL  Take 500 mg by mouth 3 (three) times daily.   amLODipine  10 MG tablet Commonly known as: NORVASC  Take 10 mg by mouth daily.   aspirin  EC 81 MG tablet Take 81 mg by mouth daily.   atorvastatin  40 MG tablet Commonly known as: LIPITOR Take 40 mg by mouth daily.   calcitRIOL  0.25 MCG capsule Commonly known as: ROCALTROL  Take 1 capsule (0.25 mcg total) by mouth Every Tuesday,Thursday,and Saturday with dialysis.   calcium  carbonate 500 MG chewable tablet Commonly known as: TUMS - dosed in mg elemental calcium  Chew 2 tablets (400 mg of elemental calcium  total) by mouth 2 (two) times daily.   cinacalcet  30 MG tablet Commonly known as: SENSIPAR  Take 1 tablet (30 mg total) by mouth daily with supper.   cloNIDine  0.3 mg/24hr patch Commonly known as: CATAPRES  - Dosed in mg/24 hr Place 1 patch onto the skin once a week. On Fridays   epoetin  alfa 4000 UNIT/ML injection Commonly known as: EPOGEN  Inject 1 mL (4,000 Units total) into the vein Every Tuesday,Thursday,and Saturday with dialysis. What changed:  when to take this additional instructions   fluticasone  50 MCG/ACT nasal spray Commonly known as: FLONASE  Place 1 spray into both nostrils daily as needed for allergies or rhinitis.  gabapentin  100 MG capsule Commonly known as: NEURONTIN  Take 200 mg by mouth 3 (three) times daily.   hydrALAZINE  100 MG tablet Commonly known as: APRESOLINE  Take 100 mg by mouth 3 (three) times daily.   irbesartan  300 MG tablet Commonly known as: AVAPRO  Take 1 tablet (300 mg total) by mouth every evening.   isosorbide  mononitrate 120 MG 24 hr tablet Commonly known as: IMDUR  Take 120 mg by mouth daily.   meclizine 25 MG tablet Commonly known as: ANTIVERT Take 1 tablet (25 mg total) by mouth 2 (two) times daily as needed for  dizziness.   melatonin 3 MG Tabs tablet Take 6 mg by mouth at bedtime.   nitroGLYCERIN 0.4 MG SL tablet Commonly known as: NITROSTAT Place under the tongue.   pantoprazole  40 MG tablet Commonly known as: PROTONIX  Take 1 tablet (40 mg total) by mouth daily.   polyethylene glycol 17 g packet Commonly known as: MIRALAX  / GLYCOLAX  Take 17 g by mouth daily as needed.   senna-docusate 8.6-50 MG tablet Commonly known as: Senokot-S Take 2 tablets by mouth 2 (two) times daily as needed for mild constipation.   sevelamer  carbonate 800 MG tablet Commonly known as: RENVELA  Take 1,600 mg by mouth 3 (three) times daily.   terazosin  2 MG capsule Commonly known as: HYTRIN  Take 2 mg by mouth 2 (two) times daily.   Voltaren 1 % Gel Generic drug: diclofenac Sodium Apply 1 Application topically 2 (two) times daily.        Contact information for follow-up providers     Fulton Job, Joseph Nickel, MD Follow up.   Specialty: Family Medicine Why: Hospital follow up Contact information: [address]  [address] [place] [address] [phone]              Contact information for after-discharge care     Destination     [facility] .   Service: Skilled Nursing Contact information: [address] [place]  [address] [phone]                    Discharge Exam: Filed Weights   10/25/23 0353 10/26/23 0904 10/26/23 1313  Weight: 75.8 kg 73.9 kg 72.3 kg   GENERAL:  53 y.o.-year-old patient with no acute distress.  LUNGS: Normal breath sounds bilaterally, no wheezing CARDIOVASCULAR: S1, S2 normal. No murmur   ABDOMEN: Soft, nontender, nondistended. Bowel sounds present.  EXTREMITIES: No  edema b/l. HD access   NEUROLOGIC: nonfocal  patient is alert and awake  Condition at discharge: fair  The results of significant diagnostics from this hospitalization (including imaging, microbiology, ancillary and laboratory)  are listed below for reference.   Imaging Studies: MR BRAIN WO CONTRAST Result Date: 10/24/2023 CLINICAL DATA:  Neuro deficit, acute, stroke suspected. EXAM: MRI HEAD WITHOUT CONTRAST TECHNIQUE: Multiplanar, multiecho pulse sequences of the brain and surrounding structures were obtained without intravenous contrast. COMPARISON:  CT head from earlier today. FINDINGS: Brain: Remote lacunar infarcts in bilateral basal ganglia, thalami and pons. Additional remote infarct in the posterior right temporal/occipital region. Small remote cerebellar infarcts. Patchy T2/FLAIR hyperintensities in the white matter, compatible with chronic microvascular ischemic disease. No evidence of acute infarct, acute hemorrhage, mass lesion or midline shift. Many foci of susceptibility artifact in the thalami, basal ganglia, brainstem and cerebellum, compatible with chronic microhemorrhages. Vascular: Major arterial flow voids are maintained skull base. Skull and upper cervical spine: Normal marrow signal. Sinuses/Orbits: Clear sinuses.  No acute findings.  Other: Right mastoid effusion. IMPRESSION: 1. No evidence of acute intracranial abnormality. 2. Remote infarcts and chronic microvascular ischemic disease. 3. Many chronic microhemorrhages, likely due to longstanding hypertension. 4. Right mastoid effusion. Electronically Signed   By: Stevenson Elbe M.D.   On: 10/24/2023 23:21   DG Chest 1 View Result Date: 10/24/2023 CLINICAL DATA:  Fatigue, headache EXAM: CHEST  1 VIEW COMPARISON:  08/09/2023 FINDINGS: Single frontal view of the chest demonstrates stable enlargement of the cardiac silhouette. Right internal jugular dialysis catheter unchanged. No acute airspace disease, effusion, or pneumothorax. No acute bony abnormalities. IMPRESSION: 1. Stable enlarged cardiac silhouette. 2. No acute airspace disease. Electronically Signed   By: Bobbye Burrow M.D.   On: 10/24/2023 22:41   CT HEAD WO CONTRAST Result Date:  10/24/2023 CLINICAL DATA:  Headache, photophobia, blurred vision EXAM: CT HEAD WITHOUT CONTRAST TECHNIQUE: Contiguous axial images were obtained from the base of the skull through the vertex without intravenous contrast. RADIATION DOSE REDUCTION: This exam was performed according to the departmental dose-optimization program which includes automated exposure control, adjustment of the mA and/or kV according to patient size and/or use of iterative reconstruction technique. COMPARISON:  09/11/2023 FINDINGS: Brain: Stable chronic small-vessel ischemic changes within the basal ganglia, thalami, and periventricular white matter. No evidence of acute infarct or hemorrhage. Lateral ventricles and remaining midline structures are unremarkable. No acute extra-axial fluid collections. No mass effect. Vascular: No hyperdense vessel or unexpected calcification. Skull: Normal. Negative for fracture or focal lesion. Sinuses/Orbits: Stable right mastoid effusion. Remaining paranasal sinuses are clear. Other: None. IMPRESSION: 1. Stable head CT, no acute intracranial process. 2. Stable right mastoid effusion. Electronically Signed   By: Bobbye Burrow M.D.   On: 10/24/2023 22:05    Microbiology: Results for orders placed or performed during the hospital encounter of 08/09/23  Resp panel by RT-PCR (RSV, Flu A&B, Covid) Anterior Nasal Swab     Status: None   Collection Time: 08/14/23  3:44 AM   Specimen: Anterior Nasal Swab  Result Value Ref Range Status   SARS Coronavirus 2 by RT PCR NEGATIVE NEGATIVE Final    Comment: (NOTE) SARS-CoV-2 target nucleic acids are NOT DETECTED.  The SARS-CoV-2 RNA is generally detectable in upper respiratory specimens during the acute phase of infection. The lowest concentration of SARS-CoV-2 viral copies this assay can detect is 138 copies/mL. A negative result does not preclude SARS-Cov-2 infection and should not be used as the sole basis for treatment or other patient management  decisions. A negative result may occur with  improper specimen collection/handling, submission of specimen other than nasopharyngeal swab, presence of viral mutation(s) within the areas targeted by this assay, and inadequate number of viral copies(<138 copies/mL). A negative result must be combined with clinical observations, patient history, and epidemiological information. The expected result is Negative.  Fact Sheet for Patients:  BloggerCourse.com  Fact Sheet for Healthcare Providers:  SeriousBroker.it  This test is no t yet approved or cleared by the [place]  FDA and  has been authorized for detection and/or diagnosis of SARS-CoV-2 by FDA under an Emergency Use Authorization (EUA). This EUA will remain  in effect (meaning this test can be used) for the duration of the COVID-19 declaration under Section 564(b)(1) of the Act, 21 U.S.C.section 360bbb-3(b)(1), unless the authorization is terminated  or revoked sooner.       Influenza A by PCR NEGATIVE NEGATIVE Final   Influenza B by PCR NEGATIVE NEGATIVE Final    Comment: (NOTE) The Xpert Xpress  SARS-CoV-2/FLU/RSV plus assay is intended as an aid in the diagnosis of influenza from Nasopharyngeal swab specimens and should not be used as a sole basis for treatment. Nasal washings and aspirates are unacceptable for Xpert Xpress SARS-CoV-2/FLU/RSV testing.  Fact Sheet for Patients: BloggerCourse.com  Fact Sheet for Healthcare Providers: SeriousBroker.it  This test is not yet approved or cleared by the [place]  FDA and has been authorized for detection and/or diagnosis of SARS-CoV-2 by FDA under an Emergency Use Authorization (EUA). This EUA will remain in effect (meaning this test can be used) for the duration of the COVID-19 declaration under Section 564(b)(1) of the Act, 21 U.S.C. section 360bbb-3(b)(1), unless the  authorization is terminated or revoked.     Resp Syncytial Virus by PCR NEGATIVE NEGATIVE Final    Comment: (NOTE) Fact Sheet for Patients: BloggerCourse.com  Fact Sheet for Healthcare Providers: SeriousBroker.it  This test is not yet approved or cleared by the [place]  FDA and has been authorized for detection and/or diagnosis of SARS-CoV-2 by FDA under an Emergency Use Authorization (EUA). This EUA will remain in effect (meaning this test can be used) for the duration of the COVID-19 declaration under Section 564(b)(1) of the Act, 21 U.S.C. section 360bbb-3(b)(1), unless the authorization is terminated or revoked.  Performed at [facility], [address] [place], [place] [address]   Respiratory (~20 pathogens) panel by PCR     Status: None   Collection Time: 08/14/23  3:44 AM   Specimen: Nasopharyngeal Swab; Respiratory  Result Value Ref Range Status   Adenovirus NOT DETECTED NOT DETECTED Final   Coronavirus 229E NOT DETECTED NOT DETECTED Final    Comment: (NOTE) The Coronavirus on the Respiratory Panel, DOES NOT test for the novel  Coronavirus (2019 nCoV)    Coronavirus HKU1 NOT DETECTED NOT DETECTED Final   Coronavirus NL63 NOT DETECTED NOT DETECTED Final   Coronavirus OC43 NOT DETECTED NOT DETECTED Final   Metapneumovirus NOT DETECTED NOT DETECTED Final   Rhinovirus / Enterovirus NOT DETECTED NOT DETECTED Final   Influenza A NOT DETECTED NOT DETECTED Final   Influenza B NOT DETECTED NOT DETECTED Final   Parainfluenza Virus 1 NOT DETECTED NOT DETECTED Final   Parainfluenza Virus 2 NOT DETECTED NOT DETECTED Final   Parainfluenza Virus 3 NOT DETECTED NOT DETECTED Final   Parainfluenza Virus 4 NOT DETECTED NOT DETECTED Final   Respiratory Syncytial Virus NOT DETECTED NOT DETECTED Final   Bordetella pertussis NOT DETECTED NOT DETECTED Final   Bordetella Parapertussis NOT DETECTED NOT DETECTED Final    Chlamydophila pneumoniae NOT DETECTED NOT DETECTED Final   Mycoplasma pneumoniae NOT DETECTED NOT DETECTED Final    Comment: Performed at [facility], [address] [place], [place] [address]  Culture, blood (Routine X 2) w Reflex to ID Panel     Status: None   Collection Time: 08/14/23  9:19 AM   Specimen: BLOOD  Result Value Ref Range Status   Specimen Description BLOOD BLOOD RIGHT HAND  Final   Special Requests   Final    BOTTLES DRAWN AEROBIC AND ANAEROBIC Blood Culture adequate volume   Culture   Final    NO GROWTH 5 DAYS Performed at [facility], [address] [place], [place] [address]    Report Status 08/19/2023 FINAL  Final  Culture, blood (Routine X 2) w Reflex to ID Panel     Status: None   Collection Time: 08/14/23  9:19 AM   Specimen: BLOOD  Result  Value Ref Range Status   Specimen Description BLOOD BLOOD LEFT HAND  Final   Special Requests   Final    BOTTLES DRAWN AEROBIC AND ANAEROBIC Blood Culture adequate volume   Culture   Final    NO GROWTH 5 DAYS Performed at [facility], [address] [place], [place] [address]    Report Status 08/19/2023 FINAL  Final  Ear culture     Status: None   Collection Time: 08/14/23 10:11 AM   Specimen: Ear; Other  Result Value Ref Range Status   Specimen Description   Final    EAR Performed at [facility], [address] [place], [place] [address]    Special Requests   Final    RIGHT EAR Performed at [facility], [address] [place], [place] [address]    Culture RARE STREPTOCOCCUS PNEUMONIAE  Final   Report Status 08/16/2023 FINAL  Final   Organism ID, Bacteria STREPTOCOCCUS PNEUMONIAE  Final      Susceptibility   Streptococcus pneumoniae - MIC*    ERYTHROMYCIN <=0.12 SENSITIVE Sensitive     LEVOFLOXACIN  1 SENSITIVE Sensitive     VANCOMYCIN 0.5 SENSITIVE Sensitive     PENICILLIN (meningitis) <=0.06 SENSITIVE Sensitive     PENO - penicillin <=0.06       PENICILLIN (non-meningitis) <=0.06 SENSITIVE Sensitive     PENICILLIN (oral) <=0.06 SENSITIVE Sensitive     CEFTRIAXONE (non-meningitis) <=0.12 SENSITIVE Sensitive     CEFTRIAXONE (meningitis) <=0.12 SENSITIVE Sensitive     * RARE STREPTOCOCCUS PNEUMONIAE    Labs: CBC: Recent Labs  Lab 10/24/23 2208 10/26/23 0925  WBC 6.6 6.8  NEUTROABS 4.5 4.5  HGB 9.1* 8.9*  HCT 28.1* 28.0*  MCV 83.1 83.6  PLT 160 148*   Basic Metabolic Panel: Recent Labs  Lab 10/24/23 2208 10/26/23 0925  NA 140 143  K 4.7 4.4  CL 102 106  CO2 24 23  GLUCOSE 106* 101*  BUN 94* 87*  CREATININE 7.68* 8.89*  CALCIUM  8.2* 7.9*  PHOS  --  5.2*   Liver Function Tests: Recent Labs  Lab 10/24/23 2208 10/26/23 0925  AST 27  --   ALT 16  --   ALKPHOS 277*  --   BILITOT 0.4  --   PROT 7.4  --   ALBUMIN 3.9 3.6     Discharge time spent: greater than 30 minutes.  Signed: Melvinia Stager, MD [facility] 10/27/2023

## 2023-10-27 NOTE — Plan of Care (Signed)

## 2023-10-27 NOTE — Progress Notes (Signed)
 Patient report given to RN Trevor Fudge. All questions are answered via phone. No any questions or concern at this time.

## 2023-10-27 NOTE — Progress Notes (Signed)
 [facility]  ROUNDING NOTE   Subjective:   Patient seen sitting up in bed Continues to complain of mild dizziness Denies headache or discomfort.  Dialysis completed yesterday with 2.5 L fluid removal  Objective:  Vital signs in last 24 hours:  Temp:  [98.1 F (36.7 C)-98.9 F (37.2 C)] 98.2 F (36.8 C) (05/27 0431) Pulse Rate:  [80-94] 89 (05/27 0742) Resp:  [15-20] 18 (05/27 0742) BP: (143-193)/(95-133) 155/98 (05/27 0742) SpO2:  [98 %-100 %] 100 % (05/27 0742) Weight:  [72.3 kg] 72.3 kg (05/26 1313)  Weight change:  Filed Weights   10/25/23 0353 10/26/23 0904 10/26/23 1313  Weight: 75.8 kg 73.9 kg 72.3 kg    Intake/Output: I/O last 3 completed shifts: In: -  Out: 2.5 [Other:2.5]   Intake/Output this shift:  Total I/O In: 120 [P.O.:120] Out: -   Physical Exam: General: NAD, laying in bed  Head: Normocephalic, atraumatic. Moist oral mucosal membranes  Eyes: Anicteric  Lungs:  Clear to auscultation  Heart: Regular rate and rhythm  Abdomen:  Soft, nontender  Extremities: Trace peripheral edema.  Neurologic: Alert and oriented, moving all four extremities  Skin: No lesions  Access: Rt chest permcath    Basic Metabolic Panel: Recent Labs  Lab 10/24/23 2208 10/26/23 0925  NA 140 143  K 4.7 4.4  CL 102 106  CO2 24 23  GLUCOSE 106* 101*  BUN 94* 87*  CREATININE 7.68* 8.89*  CALCIUM  8.2* 7.9*  PHOS  --  5.2*    Liver Function Tests: Recent Labs  Lab 10/24/23 2208 10/26/23 0925  AST 27  --   ALT 16  --   ALKPHOS 277*  --   BILITOT 0.4  --   PROT 7.4  --   ALBUMIN 3.9 3.6   No results for input(s): "LIPASE", "AMYLASE" in the last 168 hours. No results for input(s): "AMMONIA" in the last 168 hours.  CBC: Recent Labs  Lab 10/24/23 2208 10/26/23 0925  WBC 6.6 6.8  NEUTROABS 4.5 4.5  HGB 9.1* 8.9*  HCT 28.1* 28.0*  MCV 83.1 83.6  PLT 160 148*    Cardiac Enzymes: No results for input(s): "CKTOTAL", "CKMB", "CKMBINDEX",  "TROPONINI" in the last 168 hours.  BNP: Invalid input(s): "POCBNP"  CBG: No results for input(s): "GLUCAP" in the last 168 hours.  Microbiology: Results for orders placed or performed during the hospital encounter of 08/09/23  Resp panel by RT-PCR (RSV, Flu A&B, Covid) Anterior Nasal Swab     Status: None   Collection Time: 08/14/23  3:44 AM   Specimen: Anterior Nasal Swab  Result Value Ref Range Status   SARS Coronavirus 2 by RT PCR NEGATIVE NEGATIVE Final    Comment: (NOTE) SARS-CoV-2 target nucleic acids are NOT DETECTED.  The SARS-CoV-2 RNA is generally detectable in upper respiratory specimens during the acute phase of infection. The lowest concentration of SARS-CoV-2 viral copies this assay can detect is 138 copies/mL. A negative result does not preclude SARS-Cov-2 infection and should not be used as the sole basis for treatment or other patient management decisions. A negative result may occur with  improper specimen collection/handling, submission of specimen other than nasopharyngeal swab, presence of viral mutation(s) within the areas targeted by this assay, and inadequate number of viral copies(<138 copies/mL). A negative result must be combined with clinical observations, patient history, and epidemiological information. The expected result is Negative.  Fact Sheet for Patients:  BloggerCourse.com  Fact Sheet for Healthcare Providers:  SeriousBroker.it  This  test is no t yet approved or cleared by the [place]  FDA and  has been authorized for detection and/or diagnosis of SARS-CoV-2 by FDA under an Emergency Use Authorization (EUA). This EUA will remain  in effect (meaning this test can be used) for the duration of the COVID-19 declaration under Section 564(b)(1) of the Act, 21 U.S.C.section 360bbb-3(b)(1), unless the authorization is terminated  or revoked sooner.       Influenza A by PCR NEGATIVE  NEGATIVE Final   Influenza B by PCR NEGATIVE NEGATIVE Final    Comment: (NOTE) The Xpert Xpress SARS-CoV-2/FLU/RSV plus assay is intended as an aid in the diagnosis of influenza from Nasopharyngeal swab specimens and should not be used as a sole basis for treatment. Nasal washings and aspirates are unacceptable for Xpert Xpress SARS-CoV-2/FLU/RSV testing.  Fact Sheet for Patients: BloggerCourse.com  Fact Sheet for Healthcare Providers: SeriousBroker.it  This test is not yet approved or cleared by the [place]  FDA and has been authorized for detection and/or diagnosis of SARS-CoV-2 by FDA under an Emergency Use Authorization (EUA). This EUA will remain in effect (meaning this test can be used) for the duration of the COVID-19 declaration under Section 564(b)(1) of the Act, 21 U.S.C. section 360bbb-3(b)(1), unless the authorization is terminated or revoked.     Resp Syncytial Virus by PCR NEGATIVE NEGATIVE Final    Comment: (NOTE) Fact Sheet for Patients: BloggerCourse.com  Fact Sheet for Healthcare Providers: SeriousBroker.it  This test is not yet approved or cleared by the [place]  FDA and has been authorized for detection and/or diagnosis of SARS-CoV-2 by FDA under an Emergency Use Authorization (EUA). This EUA will remain in effect (meaning this test can be used) for the duration of the COVID-19 declaration under Section 564(b)(1) of the Act, 21 U.S.C. section 360bbb-3(b)(1), unless the authorization is terminated or revoked.  Performed at [facility], [address] [place], [place] [address]   Respiratory (~20 pathogens) panel by PCR     Status: None   Collection Time: 08/14/23  3:44 AM   Specimen: Nasopharyngeal Swab; Respiratory  Result Value Ref Range Status   Adenovirus NOT DETECTED NOT DETECTED Final   Coronavirus 229E NOT DETECTED NOT  DETECTED Final    Comment: (NOTE) The Coronavirus on the Respiratory Panel, DOES NOT test for the novel  Coronavirus (2019 nCoV)    Coronavirus HKU1 NOT DETECTED NOT DETECTED Final   Coronavirus NL63 NOT DETECTED NOT DETECTED Final   Coronavirus OC43 NOT DETECTED NOT DETECTED Final   Metapneumovirus NOT DETECTED NOT DETECTED Final   Rhinovirus / Enterovirus NOT DETECTED NOT DETECTED Final   Influenza A NOT DETECTED NOT DETECTED Final   Influenza B NOT DETECTED NOT DETECTED Final   Parainfluenza Virus 1 NOT DETECTED NOT DETECTED Final   Parainfluenza Virus 2 NOT DETECTED NOT DETECTED Final   Parainfluenza Virus 3 NOT DETECTED NOT DETECTED Final   Parainfluenza Virus 4 NOT DETECTED NOT DETECTED Final   Respiratory Syncytial Virus NOT DETECTED NOT DETECTED Final   Bordetella pertussis NOT DETECTED NOT DETECTED Final   Bordetella Parapertussis NOT DETECTED NOT DETECTED Final   Chlamydophila pneumoniae NOT DETECTED NOT DETECTED Final   Mycoplasma pneumoniae NOT DETECTED NOT DETECTED Final    Comment: Performed at [facility], [address], [place], [place] [address]  Culture, blood (Routine X 2) w Reflex to ID Panel     Status: None   Collection Time: 08/14/23  9:19 AM   Specimen: BLOOD  Result Value Ref Range Status   Specimen Description BLOOD BLOOD RIGHT HAND  Final   Special Requests   Final    BOTTLES DRAWN AEROBIC AND ANAEROBIC Blood Culture adequate volume   Culture   Final    NO GROWTH 5 DAYS Performed at [facility], [address] [place], [place] [address]    Report Status 08/19/2023 FINAL  Final  Culture, blood (Routine X 2) w Reflex to ID Panel     Status: None   Collection Time: 08/14/23  9:19 AM   Specimen: BLOOD  Result Value Ref Range Status   Specimen Description BLOOD BLOOD LEFT HAND  Final   Special Requests   Final    BOTTLES DRAWN AEROBIC AND ANAEROBIC Blood Culture adequate volume   Culture   Final    NO GROWTH 5 DAYS Performed at  [facility], [address] [place], [place] [address]    Report Status 08/19/2023 FINAL  Final  Ear culture     Status: None   Collection Time: 08/14/23 10:11 AM   Specimen: Ear; Other  Result Value Ref Range Status   Specimen Description   Final    EAR Performed at [facility], [address] [place], [place] [address]    Special Requests   Final    RIGHT EAR Performed at [facility], [address] [place], [place] [address]    Culture RARE STREPTOCOCCUS PNEUMONIAE  Final   Report Status 08/16/2023 FINAL  Final   Organism ID, Bacteria STREPTOCOCCUS PNEUMONIAE  Final      Susceptibility   Streptococcus pneumoniae - MIC*    ERYTHROMYCIN <=0.12 SENSITIVE Sensitive     LEVOFLOXACIN  1 SENSITIVE Sensitive     VANCOMYCIN 0.5 SENSITIVE Sensitive     PENICILLIN (meningitis) <=0.06 SENSITIVE Sensitive     PENO - penicillin <=0.06      PENICILLIN (non-meningitis) <=0.06 SENSITIVE Sensitive     PENICILLIN (oral) <=0.06 SENSITIVE Sensitive     CEFTRIAXONE (non-meningitis) <=0.12 SENSITIVE Sensitive     CEFTRIAXONE (meningitis) <=0.12 SENSITIVE Sensitive     * RARE STREPTOCOCCUS PNEUMONIAE    Coagulation Studies: Recent Labs    10/24/23 November 22, 2206  LABPROT 13.1  INR 1.0    Urinalysis: Recent Labs    10/24/23 2356  COLORURINE YELLOW*  LABSPEC 1.013  PHURINE 7.0  GLUCOSEU NEGATIVE  HGBUR NEGATIVE  BILIRUBINUR NEGATIVE  KETONESUR NEGATIVE  PROTEINUR 100*  NITRITE NEGATIVE  LEUKOCYTESUR NEGATIVE      Imaging: No results found.    Medications:     amLODipine   10 mg Oral Daily   aspirin  EC  81 mg Oral Daily   atorvastatin   40 mg Oral Daily   calcitRIOL   0.25 mcg Oral Q T,Th,Sa-HD   calcium  carbonate  400 mg of elemental calcium  Oral BID   Chlorhexidine  Gluconate Cloth  6 each Topical Q0600   cinacalcet   30 mg Oral Q supper   docusate sodium   100 mg Oral BID   [START ON 10/28/2023] epoetin  alfa-epbx (RETACRIT ) injection  4,000 Units  Subcutaneous Q M,W,F-HD   gabapentin   200 mg Oral BID   heparin   5,000 Units Subcutaneous Q8H   hydrALAZINE   100 mg Oral Q8H   irbesartan   300 mg Oral QPM   isosorbide  mononitrate  120 mg Oral Daily   labetalol   600 mg Oral BID   meclizine  25 mg Oral TID   pantoprazole   40 mg Oral Daily   sevelamer  carbonate  1,600  mg Oral TID WC   terazosin   2 mg Oral BID   acetaminophen  **OR** acetaminophen , hydrOXYzine , ondansetron  **OR** ondansetron  (ZOFRAN ) IV, mouth rinse, polyethylene glycol, prochlorperazine  Assessment/ Plan:  Allison Hill is a 53 y.o.  female  with end stage renal disease on hemodialysis, hypertension, CHF, anemia, and vertigo who is admitted to [facility] on 10/24/2023 for Vertigo [R42] Facial asymmetry [Q67.0] History of CVA (cerebrovascular accident) [Z86.73] Acute nonintractable headache, unspecified headache type [R51.9] Hypertension, unspecified type [I10]  [facility]/MTWF/PC      1.  End stage renal disease on hemodialysis. Seen and examined on hemodialysis treatment.  Patient received dialysis yesterday, UF 2.5 L achieved.  Next treatment scheduled for Wednesday. Avoid volume overload. Continue fluid and salt restriction     2. Anemia of chronic kidney disease Hemoglobin & Hematocrit     Component Value Date/Time   HGB 8.9 (L) 10/26/2023 0925   HCT 28.0 (L) 10/26/2023 0925  Hemoglobin just outside of optimal range. Will receive low dose Epo with dialysis.   3.  Hypertension of chronic disease:   - Blood pressure 155/98 this morning.             - Continue regimen of hydralazine , irbesartan , isosorbide  mononitrate, terazosin , amlodipine , and labetalol .    4. Secondary Hyperparathyroidism:    Lab Results  Component Value Date   PTH 1,248 (H) 08/11/2023   CALCIUM  7.9 (L) 10/26/2023   PHOS 5.2 (H) 10/26/2023    - Monitoring bone minerals during this admission. - Continue calcium  carbonate, calcitriol , Renvela , and cinacalcet .      LOS: 0 Destenee Guerry 5/27/202511:09 AM

## 2023-10-28 LAB — HEPATITIS B SURFACE ANTIBODY, QUANTITATIVE: Hep B S AB Quant (Post): 13.2 m[IU]/mL

## 2023-11-09 ENCOUNTER — Inpatient Hospital Stay
Admission: EM | Admit: 2023-11-09 | Discharge: 2023-11-11 | DRG: 077 | Disposition: A | Discharge status: Skilled Nursing Facility | Source: Skilled Nursing Facility | Attending: Internal Medicine | Admitting: Internal Medicine

## 2023-11-09 ENCOUNTER — Emergency Department

## 2023-11-09 ENCOUNTER — Other Ambulatory Visit: Payer: Self-pay

## 2023-11-09 DIAGNOSIS — F17210 Nicotine dependence, cigarettes, uncomplicated: Secondary | ICD-10-CM | POA: Diagnosis present

## 2023-11-09 DIAGNOSIS — N2581 Secondary hyperparathyroidism of renal origin: Secondary | ICD-10-CM | POA: Diagnosis present

## 2023-11-09 DIAGNOSIS — H81399 Other peripheral vertigo, unspecified ear: Secondary | ICD-10-CM | POA: Diagnosis present

## 2023-11-09 DIAGNOSIS — E663 Overweight: Secondary | ICD-10-CM | POA: Diagnosis present

## 2023-11-09 DIAGNOSIS — I674 Hypertensive encephalopathy: Principal | ICD-10-CM | POA: Diagnosis present

## 2023-11-09 DIAGNOSIS — R278 Other lack of coordination: Secondary | ICD-10-CM | POA: Diagnosis present

## 2023-11-09 DIAGNOSIS — H748X1 Other specified disorders of right middle ear and mastoid: Secondary | ICD-10-CM | POA: Diagnosis present

## 2023-11-09 DIAGNOSIS — N186 End stage renal disease: Secondary | ICD-10-CM | POA: Diagnosis present

## 2023-11-09 DIAGNOSIS — I132 Hypertensive heart and chronic kidney disease with heart failure and with stage 5 chronic kidney disease, or end stage renal disease: Secondary | ICD-10-CM | POA: Diagnosis present

## 2023-11-09 DIAGNOSIS — Z8673 Personal history of transient ischemic attack (TIA), and cerebral infarction without residual deficits: Secondary | ICD-10-CM

## 2023-11-09 DIAGNOSIS — G43809 Other migraine, not intractable, without status migrainosus: Secondary | ICD-10-CM

## 2023-11-09 DIAGNOSIS — R531 Weakness: Secondary | ICD-10-CM | POA: Diagnosis present

## 2023-11-09 DIAGNOSIS — I5032 Chronic diastolic (congestive) heart failure: Secondary | ICD-10-CM | POA: Diagnosis present

## 2023-11-09 DIAGNOSIS — Z6829 Body mass index (BMI) 29.0-29.9, adult: Secondary | ICD-10-CM

## 2023-11-09 DIAGNOSIS — G43909 Migraine, unspecified, not intractable, without status migrainosus: Secondary | ICD-10-CM | POA: Diagnosis not present

## 2023-11-09 DIAGNOSIS — I10 Essential (primary) hypertension: Secondary | ICD-10-CM | POA: Diagnosis present

## 2023-11-09 DIAGNOSIS — D509 Iron deficiency anemia, unspecified: Secondary | ICD-10-CM | POA: Diagnosis present

## 2023-11-09 DIAGNOSIS — I69320 Aphasia following cerebral infarction: Secondary | ICD-10-CM

## 2023-11-09 DIAGNOSIS — D631 Anemia in chronic kidney disease: Secondary | ICD-10-CM | POA: Diagnosis present

## 2023-11-09 DIAGNOSIS — I69354 Hemiplegia and hemiparesis following cerebral infarction affecting left non-dominant side: Secondary | ICD-10-CM

## 2023-11-09 DIAGNOSIS — R4701 Aphasia: Secondary | ICD-10-CM

## 2023-11-09 DIAGNOSIS — Z79899 Other long term (current) drug therapy: Secondary | ICD-10-CM

## 2023-11-09 DIAGNOSIS — F1920 Other psychoactive substance dependence, uncomplicated: Secondary | ICD-10-CM | POA: Diagnosis present

## 2023-11-09 DIAGNOSIS — Z992 Dependence on renal dialysis: Secondary | ICD-10-CM | POA: Diagnosis not present

## 2023-11-09 DIAGNOSIS — H7091 Unspecified mastoiditis, right ear: Secondary | ICD-10-CM

## 2023-11-09 DIAGNOSIS — Z7982 Long term (current) use of aspirin: Secondary | ICD-10-CM

## 2023-11-09 LAB — DIFFERENTIAL
Abs Immature Granulocytes: 0.01 10*3/uL (ref 0.00–0.07)
Basophils Absolute: 0 10*3/uL (ref 0.0–0.1)
Basophils Relative: 0 %
Eosinophils Absolute: 0.2 10*3/uL (ref 0.0–0.5)
Eosinophils Relative: 3 %
Immature Granulocytes: 0 %
Lymphocytes Relative: 23 %
Lymphs Abs: 1.5 10*3/uL (ref 0.7–4.0)
Monocytes Absolute: 0.7 10*3/uL (ref 0.1–1.0)
Monocytes Relative: 10 %
Neutro Abs: 4.3 10*3/uL (ref 1.7–7.7)
Neutrophils Relative %: 64 %

## 2023-11-09 LAB — CBC
HCT: 25.1 % — ABNORMAL LOW (ref 36.0–46.0)
Hemoglobin: 8.3 g/dL — ABNORMAL LOW (ref 12.0–15.0)
MCH: 26.9 pg (ref 26.0–34.0)
MCHC: 33.1 g/dL (ref 30.0–36.0)
MCV: 81.2 fL (ref 80.0–100.0)
Platelets: 158 10*3/uL (ref 150–400)
RBC: 3.09 MIL/uL — ABNORMAL LOW (ref 3.87–5.11)
RDW: 17.2 % — ABNORMAL HIGH (ref 11.5–15.5)
WBC: 6.6 10*3/uL (ref 4.0–10.5)
nRBC: 0 % (ref 0.0–0.2)

## 2023-11-09 LAB — COMPREHENSIVE METABOLIC PANEL WITH GFR
ALT: 16 U/L (ref 0–44)
AST: 25 U/L (ref 15–41)
Albumin: 4 g/dL (ref 3.5–5.0)
Alkaline Phosphatase: 286 U/L — ABNORMAL HIGH (ref 38–126)
Anion gap: 13 (ref 5–15)
BUN: 70 mg/dL — ABNORMAL HIGH (ref 6–20)
CO2: 27 mmol/L (ref 22–32)
Calcium: 9.2 mg/dL (ref 8.9–10.3)
Chloride: 101 mmol/L (ref 98–111)
Creatinine, Ser: 5.31 mg/dL — ABNORMAL HIGH (ref 0.44–1.00)
GFR, Estimated: 9 mL/min — ABNORMAL LOW (ref >60)
Glucose, Bld: 106 mg/dL — ABNORMAL HIGH (ref 70–99)
Potassium: 3.7 mmol/L (ref 3.5–5.1)
Sodium: 141 mmol/L (ref 135–145)
Total Bilirubin: 0.6 mg/dL (ref 0.0–1.2)
Total Protein: 7.9 g/dL (ref 6.5–8.1)

## 2023-11-09 LAB — APTT: aPTT: 29 s (ref 24–36)

## 2023-11-09 LAB — CBG MONITORING, ED: Glucose-Capillary: 102 mg/dL — ABNORMAL HIGH (ref 70–99)

## 2023-11-09 LAB — PROTIME-INR
INR: 1 (ref 0.8–1.2)
Prothrombin Time: 13.1 s (ref 11.4–15.2)

## 2023-11-09 LAB — ETHANOL: Alcohol, Ethyl (B): <15 mg/dL (ref <15)

## 2023-11-09 MED ORDER — PROCHLORPERAZINE EDISYLATE 10 MG/2ML IJ SOLN
10.0000 mg | Freq: Once | INTRAMUSCULAR | Status: AC
  Administered 2023-11-09: 10 mg via INTRAVENOUS
  Filled 2023-11-09: qty 2

## 2023-11-09 MED ORDER — SODIUM CHLORIDE 0.9% FLUSH
3.0000 mL | Freq: Once | INTRAVENOUS | Status: AC
  Administered 2023-11-09: 3 mL via INTRAVENOUS

## 2023-11-09 MED ORDER — IOHEXOL 350 MG/ML SOLN
100.0000 mL | Freq: Once | INTRAVENOUS | Status: AC | PRN
  Administered 2023-11-09: 100 mL via INTRAVENOUS

## 2023-11-09 MED ORDER — HYDRALAZINE HCL 20 MG/ML IJ SOLN
10.0000 mg | Freq: Once | INTRAMUSCULAR | Status: AC
  Administered 2023-11-10: 10 mg via INTRAVENOUS
  Filled 2023-11-09: qty 1

## 2023-11-09 MED ORDER — ACETAMINOPHEN 500 MG PO TABS
1000.0000 mg | ORAL_TABLET | Freq: Once | ORAL | Status: DC
  Filled 2023-11-09: qty 2

## 2023-11-09 MED ORDER — DIPHENHYDRAMINE HCL 50 MG/ML IJ SOLN
25.0000 mg | Freq: Once | INTRAMUSCULAR | Status: AC
  Administered 2023-11-09: 25 mg via INTRAVENOUS
  Filled 2023-11-09: qty 1

## 2023-11-09 NOTE — ED Triage Notes (Addendum)
 Pt to ED via EMS from [facility], pt reports around 12pm she began to have a headache that started after dialysis, pt states she was not able to finish her treatment due to her cramping, pt states around 1400 today she developed slurred speech blurred vision and left side weakness. Pt reports hx previous stroke with left side deficits already.

## 2023-11-09 NOTE — ED Provider Notes (Signed)
 [facility] Provider Note    Event Date/Time   First MD Initiated Contact with Patient 11/09/23 1915     (approximate)   History   Aphasia   HPI  Kieryn Burtis is a 53 y.o. female who presents to the ED for evaluation of Aphasia   Review of medical DC summary from 5/27.  Dialysis patient MWF iHD,, previous stroke.  Patient presents to the ED for evaluation of worsening fluency of her speech, headache and photophobia and left-sided weakness.  Stroke alert was called from triage.  History of hemodialysis, dialyzed this morning with an incomplete session   Physical Exam   Triage Vital Signs: ED Triage Vitals  Encounter Vitals Group     BP 11/09/23 1904 (!) 198/117     Systolic BP Percentile --      Diastolic BP Percentile --      Pulse Rate 11/09/23 1904 86     Resp 11/09/23 1904 20     Temp 11/09/23 1904 98.4 F (36.9 C)     Temp Source 11/09/23 1904 Oral     SpO2 11/09/23 1904 100 %     Weight 11/09/23 1905 170 lb (77.1 kg)     Height 11/09/23 1905 5\' 3"  (1.6 m)     Head Circumference --      Peak Flow --      Pain Score 11/09/23 1922 9     Pain Loc --      Pain Education --      Exclude from Growth Chart --     Most recent vital signs: Vitals:   11/09/23 1904  BP: (!) 198/117  Pulse: 86  Resp: 20  Temp: 98.4 F (36.9 C)  SpO2: 100%    General: Awake, no distress.  CV:  Good peripheral perfusion.  Resp:  Normal effort.  Abd:  No distention.  MSK:  No deformity noted.  Neuro:  No focal deficits appreciated. Other:  Facial asymmetry and left-sided dysmetria is noted, though some degree of chronicity here.   ED Results / Procedures / Treatments   Labs (all labs ordered are listed, but only abnormal results are displayed) Labs Reviewed  CBC - Abnormal; Notable for the following components:      Result Value   RBC 3.09 (*)    Hemoglobin 8.3 (*)    HCT 25.1 (*)    RDW 17.2 (*)    All other components within normal  limits  COMPREHENSIVE METABOLIC PANEL WITH GFR - Abnormal; Notable for the following components:   Glucose, Bld 106 (*)    BUN 70 (*)    Creatinine, Ser 5.31 (*)    Alkaline Phosphatase 286 (*)    GFR, Estimated 9 (*)    All other components within normal limits  CBG MONITORING, ED - Abnormal; Notable for the following components:   Glucose-Capillary 102 (*)    All other components within normal limits  PROTIME-INR  APTT  DIFFERENTIAL  ETHANOL  CBG MONITORING, ED    EKG Sinus rhythm with a rate of 77 bpm.  Normal axis.  Nonspecific changes.  No STEMI.  RADIOLOGY CT head interpreted by me without evidence of acute intracranial pathology CTA neck/head interpreted by me without LVO  Official radiology report(s): CT ANGIO HEAD NECK W WO CM W PERF (CODE STROKE) Result Date: 11/09/2023 CLINICAL DATA:  Initial evaluation for acute neuro deficit, stroke suspected. EXAM: CT ANGIOGRAPHY HEAD AND NECK CT PERFUSION BRAIN TECHNIQUE: Multidetector CT imaging of  the head and neck was performed using the standard protocol during bolus administration of intravenous contrast. Multiplanar CT image reconstructions and MIPs were obtained to evaluate the vascular anatomy. Carotid stenosis measurements (when applicable) are obtained utilizing NASCET criteria, using the distal internal carotid diameter as the denominator. Multiphase CT imaging of the brain was performed following IV bolus contrast injection. Subsequent parametric perfusion maps were calculated using RAPID software. RADIATION DOSE REDUCTION: This exam was performed according to the departmental dose-optimization program which includes automated exposure control, adjustment of the mA and/or kV according to patient size and/or use of iterative reconstruction technique. CONTRAST:  100mL OMNIPAQUE IOHEXOL 350 MG/ML SOLN COMPARISON:  CT from earlier the same day. FINDINGS: CTA NECK FINDINGS Aortic arch: Visualized aortic arch within normal limits for  caliber with standard 3 vessel morphology. Aortic atherosclerosis. No significant stenosis about the origin of the great vessels. Right carotid system: Right common and internal carotid arteries are tortuous but patent without stenosis or dissection. Left carotid system: Left common and internal carotid arteries are tortuous but patent without stenosis or dissection. Vertebral arteries: Both vertebral arteries arise from the subclavian arteries. Left vertebral artery dominant. Vertebral arteries patent without stenosis or dissection. Skeleton: No worrisome osseous lesions. Moderate spondylosis at C5-6. Other neck: No other acute finding. Right IJ approach central venous catheter in place. Upper chest: No other acute finding. Review of the MIP images confirms the above findings CTA HEAD FINDINGS Anterior circulation: Inch mild atheromatous change about the carotid siphons without stenosis. A1 segments patent bilaterally. Normal anterior communicating artery complex. Atheromatous irregularity about the ACAs without proximal high-grade stenosis. No M1 stenosis or occlusion. Distal MCA branches perfused and symmetric. Posterior circulation: Vertebrobasilar system with somewhat dolichoectatic. Both V4 segments patent without stenosis. Both PICA grossly patent at their origins. Basilar mildly irregular but patent without stenosis. Superior cerebellar and posterior cerebral arteries patent bilaterally. Venous sinuses: Patent allowing for timing the contrast bolus. Anatomic variants: None significant.  No aneurysm. Review of the MIP images confirms the above findings CT Brain Perfusion Findings: ASPECTS: 10 CBF (<30%) Volume: 0mL Perfusion (Tmax>6.0s) volume: 0mL Mismatch Volume: 0mL Infarction Location:Negative CT perfusion with no evidence for acute ischemia or other perfusion abnormality. IMPRESSION: 1. Negative CTA of the head and neck. No large vessel occlusion or other emergent finding. 2. Negative CT perfusion with  no evidence for acute ischemia or other perfusion abnormality. 3. Diffuse tortuosity of the major arterial vasculature of the head and neck, suggesting chronic underlying hypertension. No hemodynamically significant or correctable stenosis. 4.  Aortic Atherosclerosis (ICD10-I70.0). Electronically Signed   By: Virgia Griffins M.D.   On: 11/09/2023 21:43   CT HEAD CODE STROKE WO CONTRAST Result Date: 11/09/2023 CLINICAL DATA:  Code stroke. Initial evaluation for acute neuro deficit, stroke suspected. EXAM: CT HEAD WITHOUT CONTRAST TECHNIQUE: Contiguous axial images were obtained from the base of the skull through the vertex without intravenous contrast. RADIATION DOSE REDUCTION: This exam was performed according to the departmental dose-optimization program which includes automated exposure control, adjustment of the mA and/or kV according to patient size and/or use of iterative reconstruction technique. COMPARISON:  Prior study from 10/24/2023 FINDINGS: Brain: Mild age-related cerebral atrophy. Patchy hypodensity involving the supratentorial cerebral white matter, consistent chronic small vessel ischemic disease, moderately advanced in nature. Remote lacunar infarcts present about the thalami. No acute intracranial hemorrhage. No acute large vessel territory infarct. No mass lesion or midline shift. No hydrocephalus or extra-axial fluid collection. Vascular: No abnormal hyperdense vessel. Scattered  vascular calcifications noted within the carotid siphons. Skull: Scalp soft tissues within normal limits.  Calvarium intact. Sinuses/Orbits: Globes orbital soft tissues within normal limits. Paranasal sinuses are clear. Large right mastoid effusion, similar to prior, and likely chronic. Left mastoid air cells are clear. Other: None. ASPECTS [facility] Stroke Program Early CT Score) - Ganglionic level infarction (caudate, lentiform nuclei, internal capsule, insula, M1-M3 cortex): 7 - Supraganglionic infarction (M4-M6  cortex): 3 Total score (0-10 with 10 being normal): 10 IMPRESSION: 1. No acute intracranial abnormality. 2. Aspects is 10. 3. Atrophy with moderately advanced chronic microvascular ischemic disease. 4. Large right mastoid effusion, similar to prior, likely chronic. Correlation with physical exam suggested. Results were called by telephone at the time of interpretation on 11/09/2023 at 7:26 pm to provider [facility] , who verbally acknowledged these results. Electronically Signed   By: Virgia Griffins M.D.   On: 11/09/2023 19:27    PROCEDURES and INTERVENTIONS:  .1-3 Lead EKG Interpretation  Performed by: Arline Bennett, MD Authorized by: Arline Bennett, MD     Interpretation: normal     ECG rate:  80   ECG rate assessment: normal     Rhythm: sinus rhythm     Ectopy: none     Conduction: normal   .Critical Care  Performed by: Arline Bennett, MD Authorized by: Arline Bennett, MD   Critical care provider statement:    Critical care time (minutes):  30   Critical care time was exclusive of:  Separately billable procedures and treating other patients   Critical care was necessary to treat or prevent imminent or life-threatening deterioration of the following conditions:  CNS failure or compromise   Critical care was time spent personally by me on the following activities:  Development of treatment plan with patient or surrogate, discussions with consultants, evaluation of patient's response to treatment, examination of patient, ordering and review of laboratory studies, ordering and review of radiographic studies, ordering and performing treatments and interventions, pulse oximetry, re-evaluation of patient's condition and review of old charts .Ultrasound ED Peripheral IV (Provider)  Date/Time: 11/09/2023 9:55 PM  Performed by: Arline Bennett, MD Authorized by: Arline Bennett, MD   Procedure details:    Indications: multiple failed IV attempts and poor IV access     Skin Prep: chlorhexidine   gluconate     Location: right cephalic v.   Angiocath:  20 G   Bedside Ultrasound Guided: Yes     Images: not archived     Patient tolerated procedure without complications: Yes     Dressing applied: Yes     Medications  acetaminophen  (TYLENOL ) tablet 1,000 mg (1,000 mg Oral Not Given 11/09/23 2157)  sodium chloride  flush (NS) 0.9 % injection 3 mL (3 mLs Intravenous Given 11/09/23 2157)  prochlorperazine  (COMPAZINE ) injection 10 mg (10 mg Intravenous Given 11/09/23 2100)  diphenhydrAMINE (BENADRYL) injection 25 mg (25 mg Intravenous Given 11/09/23 2103)  iohexol (OMNIPAQUE) 350 MG/ML injection 100 mL (100 mLs Intravenous Contrast Given 11/09/23 2044)     IMPRESSION / MDM / ASSESSMENT AND PLAN / ED COURSE  I reviewed the triage vital signs and the nursing notes.  Differential diagnosis includes, but is not limited to, recrudescence of old stroke, dehydration, seizure, ICH, sepsis, ischemic stroke  {Patient presents with symptoms of an acute illness or injury that is potentially life-threatening.  Patient with history of stroke presents with acute on chronic symptoms.  Presents as a stroke alert due to the timeframe of her symptoms.  Neurology consultation is  noted, CT and CTA without acute features.  MRI is pending.  She is provided medications for her migraine with improvement of her headache.  Stigmata of ESRD on the metabolic panel, chronic normocytic anemia on CBC.  Signed out to oncoming physician awaiting read of MRI.  If this does not demonstrate new stroke she may be suitable for outpatient management.  Clinical Course as of 11/09/23 2229  Mon Nov 09, 2023  1925 Call from rads., CT head ok [DS]  1942 I consult with neurology. CTA, MRI and everything. Maybe related to migraine, recrudescence of an old stroke. [DS]  2100 USIV 18g right cephalic v  [DS]    Clinical Course User Index [DS] Arline Bennett, MD     FINAL CLINICAL IMPRESSION(S) / ED DIAGNOSES   Final diagnoses:  None      Rx / DC Orders   ED Discharge Orders     None        Note:  This document was prepared using Dragon voice recognition software and may include unintentional dictation errors.   Arline Bennett, MD 11/09/23 2229

## 2023-11-09 NOTE — ED Notes (Signed)
 Pt back from CT because they stated there were unsure of the IV placed first and did not want to risk it. Dr Felipe Horton made aware and said he would place one using ultrasound

## 2023-11-09 NOTE — ED Notes (Signed)
 ED secretary notified of CODE stroke to be called as instructed by Doree Games, MD; charge nurse [facility] RN notified; pt will be taken to CT by triage nurse A Wineman RN

## 2023-11-09 NOTE — ED Notes (Signed)
 Pt cleaned up after stating she was wet. New brief, bed sheets, and bed pads applied.

## 2023-11-09 NOTE — Consult Note (Signed)
 [facility] Telemedicine Consult   Requesting Provider: Felipe Horton, D Consult Participants: Patient, bedside nursing, telestroke nurse Location of the provider: [place]  Location of the patient: [facility]  This consult was provided via telemedicine with 2-way video and audio communication. The patient/family was informed that care would be provided in this way and agreed to receive care in this manner.    Chief Complaint: Left-sided weakness  HPI: 53 year old female with a history of left-sided weakness after previous stroke who was in dialysis today when she started noticing that she was having some more left-sided weakness.  She first noticed symptoms around 2 PM and states that is been getting worse.  She was unable to finish her dialysis due to cramping.  She also complained of some blurred vision which is another symptom she had with previous strokes.  She does have a history of headaches and indeed does have a headache today as well, these headaches started after her previous stroke.    LKW: 2 PM tnk given?: No, outside of window IR Thrombectomy? No, LVO not suspected Modified Rankin Scale: 3-Moderate disability-requires help but walks WITHOUT assistance Time of teleneurologist evaluation: 1922  Exam: Vitals:   11/09/23 1904  BP: (!) 198/117  Pulse: 86  Resp: 20  Temp: 98.4 F (36.9 C)  SpO2: 100%    General: In bed, NAD  1A: Level of Consciousness - 0 1B: Ask Month and Age - 1 1C: 'Blink Eyes' & 'Squeeze Hands' - 0 2: Test Horizontal Extraocular Movements - 0 3: Test Visual Fields - 0 4: Test Facial Palsy - 1 5A: Test Left Arm Motor Drift - 1 5B: Test Right Arm Motor Drift - 0 6A: Test Left Leg Motor Drift - 1(she has a somewhat inconsistent exam, at times keeping it elevated about an inch from the ground (knee and thigh were elevated as well) and at times letting it drop immediately) 6B: Test Right Leg Motor Drift -  0 7: Test Limb Ataxia - 0 8: Test Sensation - 1 9: Test Language/Aphasia- 0 10: Test Dysarthria - 0 11: Test Extinction/Inattention - 0 NIHSS score: Five   Imaging Reviewed: CT head is negative  Labs reviewed in epic and pertinent values follow: Results for orders placed or performed during the hospital encounter of 11/09/23 (from the past 24 hours)  CBG monitoring, ED     Status: Abnormal   Collection Time: 11/09/23  7:10 PM  Result Value Ref Range   Glucose-Capillary 102 (H) 70 - 99 mg/dL      Assessment: 53 year old female with worsening of her underlying deficits in the setting of dialysis and headache.  My suspicion is that this likely represents recrudescence of her previous stroke symptoms, possibly in the setting of migraine but further evaluation is needed.  I would advise stat CTA/CTP if this does show a large vessel occlusion with a significant area of penumbra, then may consider intervention.  If no LVO was identified, then the neck step would be an MRI to see if she has had a new acute stroke.  Recommendations:  CTA/CTP If negative, MRI brain If MRI is negative, could consider treating as complicated migraine and looking for other causes of stroke recrudescence such as UTI, electrolyte abnormality, etc.    Ann Keto, MD [facility] [phone]  If 7pm- 7am, please page neurology on call as listed in AMION.

## 2023-11-09 NOTE — ED Notes (Addendum)
 Dr Santiago Cuff through secure chat: "Hey, do you want anything her BP? It is 196/125"  Dr Felipe Horton responded: "No, not yet"

## 2023-11-09 NOTE — Discharge Instructions (Addendum)

## 2023-11-09 NOTE — ED Triage Notes (Signed)
 First Nurse Note: Patient to ED via [facility] from [facility] for hypertension. Stroke screen negative- hx of same with left sided deficits. Had dialysis this AM. Facility gave clonidine  around 1815.  86 HR 177/110 100% RA

## 2023-11-09 NOTE — Progress Notes (Signed)
 Code Stroke activated @ 1913 with the patient in CT.  LKWT 1400 with blurred vision, slurred speech and left sided weakness.  The pt arrived by EMS @ 1846 from Hemodialysis.  Dr. Alecia Ames with [facility] paged @ [phone] and on camera @ 1922.  TSRN off camera @ 1940.  No TNK per Dr. Alecia Ames.    Pete Brand BSN, Occupational hygienist

## 2023-11-09 NOTE — ED Notes (Signed)
 Patient transported to CT

## 2023-11-09 NOTE — ED Provider Notes (Signed)
 11:27 PM  Assumed care at shift change.  Patient here with aphasia.  History of previous stroke.  MRI brain pending.  Patient is hypertensive but we are allowing for permissive hypertension at this time until MRI complete.  1:51 AM  Pt extremely drowsy here and very hypertensive.  I have concern for potential hypertensive encephalopathy.  I am not sure how she presented on arrival if she was this drowsy or if the migraine cocktail she was given is contributing.  Her MRI was reviewed and interpreted by myself and the radiologist and shows no acute abnormality.  I did give her 2 doses of hydralazine  here without significant change in her blood pressure.  Will start her on a Cardene infusion.  Consulted and discussed patient's case with hospitalist, Dr. Vallarie Gauze.  I have recommended admission and consulting physician agrees and will place admission orders.  Patient (and family if present) agree with this plan.   I reviewed all nursing notes, vitals, pertinent previous records.  All labs, EKGs, imaging ordered have been independently reviewed and interpreted by myself.   CRITICAL CARE Performed by: Verneda Golder   Total critical care time: 30 minutes  Critical care time was exclusive of separately billable procedures and treating other patients.  Critical care was necessary to treat or prevent imminent or life-threatening deterioration.  Critical care was time spent personally by me on the following activities: development of treatment plan with patient and/or surrogate as well as nursing, discussions with consultants, evaluation of patient's response to treatment, examination of patient, obtaining history from patient or surrogate, ordering and performing treatments and interventions, ordering and review of laboratory studies, ordering and review of radiographic studies, pulse oximetry and re-evaluation of patient's condition.    Ayala Ribble, Clover Dao, DO 11/10/23 [phone]

## 2023-11-10 DIAGNOSIS — I132 Hypertensive heart and chronic kidney disease with heart failure and with stage 5 chronic kidney disease, or end stage renal disease: Secondary | ICD-10-CM | POA: Diagnosis present

## 2023-11-10 DIAGNOSIS — R278 Other lack of coordination: Secondary | ICD-10-CM | POA: Diagnosis present

## 2023-11-10 DIAGNOSIS — F1920 Other psychoactive substance dependence, uncomplicated: Secondary | ICD-10-CM | POA: Diagnosis present

## 2023-11-10 DIAGNOSIS — I674 Hypertensive encephalopathy: Principal | ICD-10-CM

## 2023-11-10 DIAGNOSIS — I69354 Hemiplegia and hemiparesis following cerebral infarction affecting left non-dominant side: Secondary | ICD-10-CM | POA: Diagnosis not present

## 2023-11-10 DIAGNOSIS — N186 End stage renal disease: Secondary | ICD-10-CM | POA: Diagnosis present

## 2023-11-10 DIAGNOSIS — D509 Iron deficiency anemia, unspecified: Secondary | ICD-10-CM | POA: Diagnosis present

## 2023-11-10 DIAGNOSIS — I5032 Chronic diastolic (congestive) heart failure: Secondary | ICD-10-CM | POA: Diagnosis present

## 2023-11-10 DIAGNOSIS — E663 Overweight: Secondary | ICD-10-CM | POA: Diagnosis present

## 2023-11-10 DIAGNOSIS — Z992 Dependence on renal dialysis: Secondary | ICD-10-CM | POA: Diagnosis not present

## 2023-11-10 DIAGNOSIS — G43809 Other migraine, not intractable, without status migrainosus: Secondary | ICD-10-CM | POA: Diagnosis not present

## 2023-11-10 DIAGNOSIS — F17210 Nicotine dependence, cigarettes, uncomplicated: Secondary | ICD-10-CM | POA: Diagnosis present

## 2023-11-10 DIAGNOSIS — I69320 Aphasia following cerebral infarction: Secondary | ICD-10-CM | POA: Diagnosis not present

## 2023-11-10 DIAGNOSIS — N2581 Secondary hyperparathyroidism of renal origin: Secondary | ICD-10-CM | POA: Diagnosis present

## 2023-11-10 DIAGNOSIS — R531 Weakness: Secondary | ICD-10-CM | POA: Diagnosis present

## 2023-11-10 DIAGNOSIS — H81399 Other peripheral vertigo, unspecified ear: Secondary | ICD-10-CM | POA: Diagnosis present

## 2023-11-10 DIAGNOSIS — H7091 Unspecified mastoiditis, right ear: Secondary | ICD-10-CM

## 2023-11-10 DIAGNOSIS — H748X1 Other specified disorders of right middle ear and mastoid: Secondary | ICD-10-CM | POA: Diagnosis present

## 2023-11-10 DIAGNOSIS — G43909 Migraine, unspecified, not intractable, without status migrainosus: Secondary | ICD-10-CM | POA: Diagnosis present

## 2023-11-10 DIAGNOSIS — Z6829 Body mass index (BMI) 29.0-29.9, adult: Secondary | ICD-10-CM | POA: Diagnosis not present

## 2023-11-10 DIAGNOSIS — D631 Anemia in chronic kidney disease: Secondary | ICD-10-CM | POA: Diagnosis present

## 2023-11-10 DIAGNOSIS — Z7982 Long term (current) use of aspirin: Secondary | ICD-10-CM | POA: Diagnosis not present

## 2023-11-10 DIAGNOSIS — Z79899 Other long term (current) drug therapy: Secondary | ICD-10-CM | POA: Diagnosis not present

## 2023-11-10 LAB — URINE DRUG SCREEN, QUALITATIVE (ARMC ONLY)
Amphetamines, Ur Screen: NOT DETECTED
Barbiturates, Ur Screen: NOT DETECTED
Benzodiazepine, Ur Scrn: NOT DETECTED
Cannabinoid 50 Ng, Ur ~~LOC~~: NOT DETECTED
Cocaine Metabolite,Ur ~~LOC~~: NOT DETECTED
MDMA (Ecstasy)Ur Screen: NOT DETECTED
Methadone Scn, Ur: NOT DETECTED
Opiate, Ur Screen: NOT DETECTED
Phencyclidine (PCP) Ur S: NOT DETECTED
Tricyclic, Ur Screen: NOT DETECTED

## 2023-11-10 LAB — URINALYSIS, ROUTINE W REFLEX MICROSCOPIC
Bacteria, UA: NONE SEEN
Bilirubin Urine: NEGATIVE
Glucose, UA: 50 mg/dL — AB
Hgb urine dipstick: NEGATIVE
Ketones, ur: NEGATIVE mg/dL
Leukocytes,Ua: NEGATIVE
Nitrite: NEGATIVE
Protein, ur: 30 mg/dL — AB
Specific Gravity, Urine: 1.01 (ref 1.005–1.030)
pH: 9 — ABNORMAL HIGH (ref 5.0–8.0)

## 2023-11-10 LAB — MRSA NEXT GEN BY PCR, NASAL: MRSA by PCR Next Gen: DETECTED — AB

## 2023-11-10 LAB — GLUCOSE, CAPILLARY: Glucose-Capillary: 149 mg/dL — ABNORMAL HIGH (ref 70–99)

## 2023-11-10 MED ORDER — CHLORHEXIDINE GLUCONATE CLOTH 2 % EX PADS
6.0000 | MEDICATED_PAD | Freq: Every day | CUTANEOUS | Status: DC
  Administered 2023-11-11: 6 via TOPICAL

## 2023-11-10 MED ORDER — SEVELAMER CARBONATE 800 MG PO TABS
1600.0000 mg | ORAL_TABLET | Freq: Three times a day (TID) | ORAL | Status: DC
  Administered 2023-11-10 – 2023-11-11 (×5): 1600 mg via ORAL
  Filled 2023-11-10 (×7): qty 2

## 2023-11-10 MED ORDER — MELATONIN 5 MG PO TABS
5.0000 mg | ORAL_TABLET | Freq: Every day | ORAL | Status: DC
  Administered 2023-11-10: 5 mg via ORAL
  Filled 2023-11-10: qty 1

## 2023-11-10 MED ORDER — ISOSORBIDE MONONITRATE ER 30 MG PO TB24
120.0000 mg | ORAL_TABLET | Freq: Every day | ORAL | Status: DC
  Administered 2023-11-10 – 2023-11-11 (×2): 120 mg via ORAL
  Filled 2023-11-10: qty 2
  Filled 2023-11-10: qty 4

## 2023-11-10 MED ORDER — CLONIDINE HCL 0.3 MG/24HR TD PTWK: 0.3000 mg | MEDICATED_PATCH | TRANSDERMAL | Status: DC

## 2023-11-10 MED ORDER — ATORVASTATIN CALCIUM 20 MG PO TABS
40.0000 mg | ORAL_TABLET | Freq: Every day | ORAL | Status: DC
  Administered 2023-11-10 – 2023-11-11 (×2): 40 mg via ORAL
  Filled 2023-11-10 (×2): qty 2

## 2023-11-10 MED ORDER — AMLODIPINE BESYLATE 10 MG PO TABS
10.0000 mg | ORAL_TABLET | Freq: Every day | ORAL | Status: DC
  Administered 2023-11-10 – 2023-11-11 (×2): 10 mg via ORAL
  Filled 2023-11-10 (×2): qty 1

## 2023-11-10 MED ORDER — IRBESARTAN 150 MG PO TABS
300.0000 mg | ORAL_TABLET | Freq: Every evening | ORAL | Status: DC
  Administered 2023-11-10: 300 mg via ORAL
  Filled 2023-11-10: qty 2

## 2023-11-10 MED ORDER — HYDROCODONE-ACETAMINOPHEN 5-325 MG PO TABS: 1.0000 | ORAL_TABLET | ORAL | Status: DC | PRN

## 2023-11-10 MED ORDER — CHLORHEXIDINE GLUCONATE CLOTH 2 % EX PADS
6.0000 | MEDICATED_PAD | Freq: Every day | CUTANEOUS | Status: DC
  Administered 2023-11-10 – 2023-11-11 (×2): 6 via TOPICAL

## 2023-11-10 MED ORDER — ACETAMINOPHEN 650 MG RE SUPP: 650.0000 mg | Freq: Four times a day (QID) | RECTAL | Status: DC | PRN

## 2023-11-10 MED ORDER — ENOXAPARIN SODIUM 40 MG/0.4ML IJ SOSY: 40.0000 mg | PREFILLED_SYRINGE | INTRAMUSCULAR | Status: DC

## 2023-11-10 MED ORDER — ONDANSETRON HCL 4 MG PO TABS
4.0000 mg | ORAL_TABLET | Freq: Four times a day (QID) | ORAL | Status: DC | PRN
Start: 2023-11-10 — End: 2023-11-11

## 2023-11-10 MED ORDER — ISOSORBIDE MONONITRATE ER 60 MG PO TB24: 120.0000 mg | ORAL_TABLET | Freq: Every day | ORAL | Status: DC

## 2023-11-10 MED ORDER — GABAPENTIN 100 MG PO CAPS
200.0000 mg | ORAL_CAPSULE | Freq: Three times a day (TID) | ORAL | Status: DC
  Administered 2023-11-10 – 2023-11-11 (×4): 200 mg via ORAL
  Filled 2023-11-10 (×4): qty 2

## 2023-11-10 MED ORDER — ACETAMINOPHEN 325 MG PO TABS
650.0000 mg | ORAL_TABLET | Freq: Four times a day (QID) | ORAL | Status: DC | PRN
Start: 2023-11-10 — End: 2023-11-11

## 2023-11-10 MED ORDER — ASPIRIN 81 MG PO TBEC
81.0000 mg | DELAYED_RELEASE_TABLET | Freq: Every day | ORAL | Status: DC
  Administered 2023-11-10 – 2023-11-11 (×2): 81 mg via ORAL
  Filled 2023-11-10 (×2): qty 1

## 2023-11-10 MED ORDER — HYDRALAZINE HCL 50 MG PO TABS
100.0000 mg | ORAL_TABLET | Freq: Three times a day (TID) | ORAL | Status: DC
  Administered 2023-11-10 – 2023-11-11 (×4): 100 mg via ORAL
  Filled 2023-11-10 (×4): qty 2

## 2023-11-10 MED ORDER — ONDANSETRON HCL 4 MG/2ML IJ SOLN: 4.0000 mg | Freq: Four times a day (QID) | INTRAMUSCULAR | Status: DC | PRN

## 2023-11-10 MED ORDER — NICARDIPINE HCL IN NACL 20-0.86 MG/200ML-% IV SOLN
3.0000 mg/h | INTRAVENOUS | Status: DC
  Administered 2023-11-10: 5 mg/h via INTRAVENOUS
  Filled 2023-11-10: qty 200

## 2023-11-10 MED ORDER — HYDRALAZINE HCL 20 MG/ML IJ SOLN
10.0000 mg | Freq: Once | INTRAMUSCULAR | Status: AC
  Administered 2023-11-10: 10 mg via INTRAVENOUS
  Filled 2023-11-10: qty 1

## 2023-11-10 MED ORDER — HEPARIN SODIUM (PORCINE) 5000 UNIT/ML IJ SOLN
5000.0000 [IU] | Freq: Three times a day (TID) | INTRAMUSCULAR | Status: DC
  Administered 2023-11-10 – 2023-11-11 (×4): 5000 [IU] via SUBCUTANEOUS
  Filled 2023-11-10 (×5): qty 1

## 2023-11-10 MED ORDER — SODIUM CHLORIDE 0.9 % IV BOLUS: 500.0000 mL | Freq: Once | INTRAVENOUS | Status: DC

## 2023-11-10 MED ORDER — ISOSORBIDE MONONITRATE ER 30 MG PO TB24: 120.0000 mg | ORAL_TABLET | Freq: Every day | ORAL | Status: DC

## 2023-11-10 NOTE — Progress Notes (Addendum)
 0930 Patient received from ED. Alert and oriented. B/P high but asymptomatic.  1130 Given oral medications as ordered. Patient swallows without difficulty. No new stroke symptoms after NISS done. Left sided weakness from previous stroke. Arm only slight weakness noted. Left leg more severe weakness. 1200 Ate lunch without issues. 1345 Report called to [place] on 2 C. Bed requested. Dr. Sari Cunning notified of high diastolic blood pressure. 1435 Transferred to room 230 via bed. Walked to bed with walker.

## 2023-11-10 NOTE — Assessment & Plan Note (Signed)
Nephrology consult for continuation of dialysis 

## 2023-11-10 NOTE — Assessment & Plan Note (Signed)
 Not acutely decompensated Continue home Imdur , irbesartan  and hydralazine  as Cardene is being weaned

## 2023-11-10 NOTE — Assessment & Plan Note (Signed)
 At baseline

## 2023-11-10 NOTE — Assessment & Plan Note (Signed)
 Continue Cardene infusion and wean to home meds

## 2023-11-10 NOTE — Progress Notes (Signed)
 Interim progress note not for billing.  I have seen and examined the patient, reviewed the chart, and agree with assessment and plan unless stipulated otherwise. 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 Started on nicardipene gtt, mental status has returned to baseline, nothing acute on neuroimaging (neurology has signed off), nephrology consulted for hemodialysis, will resume all home antihypertensives, anticipate stability for return to SNF tomorrow, will reach out to [facility] and order PT eval in anticipation of that.

## 2023-11-10 NOTE — H&P (Signed)
 History and Physical    Patient: Allison Hill YQM:[id number] DOB: [birth year] DOA: 11/09/2023 DOS: the patient was seen and examined on 11/10/2023 PCP: Valerie Gather, MD  Patient coming from: Home  Chief Complaint:  Chief Complaint  Patient presents with   Aphasia    HPI: Allison Hill is a 53 y.o. female with medical history significant for ESRD on HD MWF, uncontrolled HTN, chronic HFpEF, chronic IDA, recently admitted from 5/24 to 10/27/2023 with intractable peripheral vertigo, with MRI showing remote infarcts and chronic microhemorrhages without acute stroke who returns to the ED as a stroke alert,,presenting with headache and somnolence and blood pressures as high as 201/120.  Initial CT head was nonacute and she was seen by teleneurology who opined that her symptoms likely represented recrudescence of her previous stroke in the setting of migraine.  Follow-up MRI was negative for acute stroke but showed similar findings of chronic microhemorrhages and remote infarcts with again noted large right mastoid effusion of unclear significance. Other workup: Labs mostly unremarkable  EKG nonacute Was treated with IV hydralazine  with minimal improvement in blood pressure.  She was subsequently started on a Cardene infusion.  Admission requested     Review of Systems: As mentioned in the history of present illness. All other systems reviewed and are negative.  Past Medical History:  Diagnosis Date   CHF (congestive heart failure) (HCC)    Hypertension    Renal disorder    History reviewed. No pertinent surgical history. Social History:  reports that she has been smoking cigarettes. She has never used smokeless tobacco. No history on file for alcohol use and drug use.  Allergies  Allergen Reactions   Ceftriaxone Shortness Of Breath and Hives    Allergy, hives   Shellfish Allergy Hives   Minoxidil Swelling    History reviewed. No pertinent family history.  Prior to  Admission medications   Medication Sig Start Date End Date Taking? Authorizing Provider  acetaminophen  (TYLENOL ) 500 MG tablet Take 500 mg by mouth 3 (three) times daily.    [provider]  amLODipine  (NORVASC ) 10 MG tablet Take 10 mg by mouth daily.    [provider]  aspirin  EC 81 MG tablet Take 81 mg by mouth daily.    [provider]  atorvastatin  (LIPITOR) 40 MG tablet Take 40 mg by mouth daily. 07/05/23   [provider]  calcitRIOL  (ROCALTROL ) 0.25 MCG capsule Take 1 capsule (0.25 mcg total) by mouth Every Tuesday,Thursday,and Saturday with dialysis. Patient taking differently: Take 0.25 mcg by mouth 4 (four) times a week. Take one capsule by mouth every Mon, Tue, Wed, Fri with dialysis 09/24/23   Garrison Kanner, MD  calcium  carbonate (TUMS - DOSED IN MG ELEMENTAL CALCIUM ) 500 MG chewable tablet Chew 2 tablets (400 mg of elemental calcium  total) by mouth 2 (two) times daily. 09/23/23   Garrison Kanner, MD  cinacalcet  (SENSIPAR ) 30 MG tablet Take 1 tablet (30 mg total) by mouth daily with supper. 09/23/23   Garrison Kanner, MD  cloNIDine  (CATAPRES  - DOSED IN MG/24 HR) 0.3 mg/24hr patch Place 1 patch onto the skin once a week. On Fridays    [provider]  diclofenac Sodium (VOLTAREN) 1 % GEL Apply 1 Application topically 2 (two) times daily.    [provider]  epoetin  alfa (EPOGEN ) 4000 UNIT/ML injection Inject 1 mL (4,000 Units total) into the vein Every Tuesday,Thursday,and Saturday with dialysis. Patient taking differently: Inject 4,000 Units into the vein 4 (four) times a  week. On Mon, Tues, Wed, Fri at dialysis 09/24/23   Garrison Kanner, MD  fluticasone  (FLONASE ) 50 MCG/ACT nasal spray Place 1 spray into both nostrils daily as needed for allergies or rhinitis. 09/23/23   Garrison Kanner, MD  gabapentin  (NEURONTIN ) 100 MG capsule Take 200 mg by mouth 3 (three) times daily.    [provider]  hydrALAZINE  (APRESOLINE ) 100 MG tablet Take 100 mg by mouth 3  (three) times daily. 12/26/22   [provider]  irbesartan  (AVAPRO ) 300 MG tablet Take 1 tablet (300 mg total) by mouth every evening. 09/23/23   Garrison Kanner, MD  isosorbide  mononitrate (IMDUR ) 120 MG 24 hr tablet Take 120 mg by mouth daily.    [provider]  meclizine  (ANTIVERT ) 25 MG tablet Take 1 tablet (25 mg total) by mouth 2 (two) times daily as needed for dizziness. 10/27/23   Patel, Sona, MD  melatonin 3 MG TABS tablet Take 6 mg by mouth at bedtime. 12/26/22   [provider]  nitroGLYCERIN (NITROSTAT) 0.4 MG SL tablet Place under the tongue. 05/29/23   [provider]  pantoprazole  (PROTONIX ) 40 MG tablet Take 1 tablet (40 mg total) by mouth daily. 09/24/23   Garrison Kanner, MD  polyethylene glycol (MIRALAX  / GLYCOLAX ) 17 g packet Take 17 g by mouth daily as needed. Patient not taking: Reported on 10/27/2023 09/23/23   Garrison Kanner, MD  senna-docusate (SENOKOT-S) 8.6-50 MG tablet Take 2 tablets by mouth 2 (two) times daily as needed for mild constipation. 09/23/23   Garrison Kanner, MD  sevelamer  carbonate (RENVELA ) 800 MG tablet Take 1,600 mg by mouth 3 (three) times daily.    [provider]  terazosin  (HYTRIN ) 2 MG capsule Take 2 mg by mouth 2 (two) times daily.    [provider]    Physical Exam: Vitals:   11/10/23 0030 11/10/23 0100 11/10/23 0119 11/10/23 0130  BP: (!) 189/118 (!) 201/120 (!) 185/124 (!) 186/120  Pulse: 71 70 73 74  Resp: 12 11 12  (!) 21  Temp:      TempSrc:      SpO2: 100% 100% 100% 100%  Weight:      Height:       Physical Exam Vitals and nursing note reviewed.  Constitutional:      General: She is not in acute distress. HENT:     Head: Normocephalic and atraumatic.  Cardiovascular:     Rate and Rhythm: Normal rate and regular rhythm.     Heart sounds: Normal heart sounds.  Pulmonary:     Effort: Pulmonary effort is normal.     Breath sounds: Normal breath sounds.  Abdominal:     Palpations: Abdomen is soft.      Tenderness: There is no abdominal tenderness.  Neurological:     Mental Status: Mental status is at baseline.     Labs on Admission: I have personally reviewed following labs and imaging studies  CBC: Recent Labs  Lab 11/09/23 2001  WBC 6.6  NEUTROABS 4.3  HGB 8.3*  HCT 25.1*  MCV 81.2  PLT 158   Basic Metabolic Panel: Recent Labs  Lab 11/09/23 2001  NA 141  K 3.7  CL 101  CO2 27  GLUCOSE 106*  BUN 70*  CREATININE 5.31*  CALCIUM  9.2   GFR: Estimated Creatinine Clearance: 12.2 mL/min (A) (by C-G formula based on SCr of 5.31 mg/dL (H)). Liver Function Tests: Recent Labs  Lab 11/09/23 2001  AST 25  ALT 16  ALKPHOS  286*  BILITOT 0.6  PROT 7.9  ALBUMIN 4.0   No results for input(s): "LIPASE", "AMYLASE" in the last 168 hours. No results for input(s): "AMMONIA" in the last 168 hours. Coagulation Profile: Recent Labs  Lab 11/09/23 2001  INR 1.0   Cardiac Enzymes: No results for input(s): "CKTOTAL", "CKMB", "CKMBINDEX", "TROPONINI" in the last 168 hours. BNP (last 3 results) No results for input(s): "PROBNP" in the last 8760 hours. HbA1C: No results for input(s): "HGBA1C" in the last 72 hours. CBG: Recent Labs  Lab 11/09/23 1910  GLUCAP 102*   Lipid Profile: No results for input(s): "CHOL", "HDL", "LDLCALC", "TRIG", "CHOLHDL", "LDLDIRECT" in the last 72 hours. Thyroid Function Tests: No results for input(s): "TSH", "T4TOTAL", "FREET4", "T3FREE", "THYROIDAB" in the last 72 hours. Anemia Panel: No results for input(s): "VITAMINB12", "FOLATE", "FERRITIN", "TIBC", "IRON", "RETICCTPCT" in the last 72 hours. Urine analysis:    Component Value Date/Time   COLORURINE COLORLESS (A) 11/10/2023 0012   APPEARANCEUR CLEAR (A) 11/10/2023 0012   LABSPEC 1.010 11/10/2023 0012   PHURINE 9.0 (H) 11/10/2023 0012   GLUCOSEU 50 (A) 11/10/2023 0012   HGBUR NEGATIVE 11/10/2023 0012   BILIRUBINUR NEGATIVE 11/10/2023 0012   KETONESUR NEGATIVE 11/10/2023 0012    PROTEINUR 30 (A) 11/10/2023 0012   UROBILINOGEN 0.2 05/24/2007 1054   NITRITE NEGATIVE 11/10/2023 0012   LEUKOCYTESUR NEGATIVE 11/10/2023 0012    Radiological Exams on Admission: MR BRAIN WO CONTRAST Result Date: 11/10/2023 CLINICAL DATA:  Initial evaluation for acute neuro deficit, stroke suspected. EXAM: MRI HEAD WITHOUT CONTRAST TECHNIQUE: Multiplanar, multiecho pulse sequences of the brain and surrounding structures were obtained without intravenous contrast. COMPARISON:  CTs from earlier the same day. FINDINGS: Brain: Cerebral volume within normal limits. Patchy and confluent T2/FLAIR hyperintensity involving the periventricular and deep white matter both cerebral hemispheres as well as the pons, consistent with chronic small vessel ischemic disease, moderately advanced. Multiple remote lacunar infarcts present about the deep gray nuclei. No abnormal foci of restricted diffusion to suggest acute or subacute ischemia. Gray-white matter differentiation maintained. No areas of chronic cortical infarction. No acute intracranial hemorrhage. Multiple chronic micro hemorrhages noted about the cerebellum and thalami, most characteristic of chronic poorly controlled hypertension. No mass lesion, midline shift or mass effect. No hydrocephalus or extra-axial fluid collection. Pituitary gland within normal limits. Vascular: Major intracranial vascular flow voids are maintained. Skull and upper cervical spine: Craniocervical junction within normal limits. Diffuse loss of normal bone marrow signal, nonspecific but can be seen with anemia, smoking, obesity, and infiltrative/myelofibrotic marrow processes. No scalp soft tissue abnormality. Sinuses/Orbits: Globes orbital soft tissues within normal limits. Paranasal sinuses are largely clear. Large right mastoid effusion, of uncertain significance. Visualized nasopharynx unremarkable. Other: None. IMPRESSION: 1. No acute intracranial abnormality. 2. Moderately advanced  chronic microvascular ischemic disease with multiple remote lacunar infarcts about the deep gray nuclei. 3. Multiple chronic micro hemorrhages about the cerebellum and thalami, most characteristic of chronic poorly controlled hypertension. 4. Large right mastoid effusion, of uncertain significance. Correlation with physical exam recommended. Electronically Signed   By: Virgia Griffins M.D.   On: 11/10/2023 00:45   CT ANGIO HEAD NECK W WO CM W PERF (CODE STROKE) Result Date: 11/09/2023 CLINICAL DATA:  Initial evaluation for acute neuro deficit, stroke suspected. EXAM: CT ANGIOGRAPHY HEAD AND NECK CT PERFUSION BRAIN TECHNIQUE: Multidetector CT imaging of the head and neck was performed using the standard protocol during bolus administration of intravenous contrast. Multiplanar CT image reconstructions and MIPs were obtained to evaluate  the vascular anatomy. Carotid stenosis measurements (when applicable) are obtained utilizing NASCET criteria, using the distal internal carotid diameter as the denominator. Multiphase CT imaging of the brain was performed following IV bolus contrast injection. Subsequent parametric perfusion maps were calculated using RAPID software. RADIATION DOSE REDUCTION: This exam was performed according to the departmental dose-optimization program which includes automated exposure control, adjustment of the mA and/or kV according to patient size and/or use of iterative reconstruction technique. CONTRAST:  100mL OMNIPAQUE IOHEXOL 350 MG/ML SOLN COMPARISON:  CT from earlier the same day. FINDINGS: CTA NECK FINDINGS Aortic arch: Visualized aortic arch within normal limits for caliber with standard 3 vessel morphology. Aortic atherosclerosis. No significant stenosis about the origin of the great vessels. Right carotid system: Right common and internal carotid arteries are tortuous but patent without stenosis or dissection. Left carotid system: Left common and internal carotid arteries are  tortuous but patent without stenosis or dissection. Vertebral arteries: Both vertebral arteries arise from the subclavian arteries. Left vertebral artery dominant. Vertebral arteries patent without stenosis or dissection. Skeleton: No worrisome osseous lesions. Moderate spondylosis at C5-6. Other neck: No other acute finding. Right IJ approach central venous catheter in place. Upper chest: No other acute finding. Review of the MIP images confirms the above findings CTA HEAD FINDINGS Anterior circulation: Inch mild atheromatous change about the carotid siphons without stenosis. A1 segments patent bilaterally. Normal anterior communicating artery complex. Atheromatous irregularity about the ACAs without proximal high-grade stenosis. No M1 stenosis or occlusion. Distal MCA branches perfused and symmetric. Posterior circulation: Vertebrobasilar system with somewhat dolichoectatic. Both V4 segments patent without stenosis. Both PICA grossly patent at their origins. Basilar mildly irregular but patent without stenosis. Superior cerebellar and posterior cerebral arteries patent bilaterally. Venous sinuses: Patent allowing for timing the contrast bolus. Anatomic variants: None significant.  No aneurysm. Review of the MIP images confirms the above findings CT Brain Perfusion Findings: ASPECTS: 10 CBF (<30%) Volume: 0mL Perfusion (Tmax>6.0s) volume: 0mL Mismatch Volume: 0mL Infarction Location:Negative CT perfusion with no evidence for acute ischemia or other perfusion abnormality. IMPRESSION: 1. Negative CTA of the head and neck. No large vessel occlusion or other emergent finding. 2. Negative CT perfusion with no evidence for acute ischemia or other perfusion abnormality. 3. Diffuse tortuosity of the major arterial vasculature of the head and neck, suggesting chronic underlying hypertension. No hemodynamically significant or correctable stenosis. 4.  Aortic Atherosclerosis (ICD10-I70.0). Electronically Signed   By: Virgia Griffins M.D.   On: 11/09/2023 21:43   CT HEAD CODE STROKE WO CONTRAST Result Date: 11/09/2023 CLINICAL DATA:  Code stroke. Initial evaluation for acute neuro deficit, stroke suspected. EXAM: CT HEAD WITHOUT CONTRAST TECHNIQUE: Contiguous axial images were obtained from the base of the skull through the vertex without intravenous contrast. RADIATION DOSE REDUCTION: This exam was performed according to the departmental dose-optimization program which includes automated exposure control, adjustment of the mA and/or kV according to patient size and/or use of iterative reconstruction technique. COMPARISON:  Prior study from 10/24/2023 FINDINGS: Brain: Mild age-related cerebral atrophy. Patchy hypodensity involving the supratentorial cerebral white matter, consistent chronic small vessel ischemic disease, moderately advanced in nature. Remote lacunar infarcts present about the thalami. No acute intracranial hemorrhage. No acute large vessel territory infarct. No mass lesion or midline shift. No hydrocephalus or extra-axial fluid collection. Vascular: No abnormal hyperdense vessel. Scattered vascular calcifications noted within the carotid siphons. Skull: Scalp soft tissues within normal limits.  Calvarium intact. Sinuses/Orbits: Globes orbital soft tissues within normal limits. Paranasal  sinuses are clear. Large right mastoid effusion, similar to prior, and likely chronic. Left mastoid air cells are clear. Other: None. ASPECTS [facility] Stroke Program Early CT Score) - Ganglionic level infarction (caudate, lentiform nuclei, internal capsule, insula, M1-M3 cortex): 7 - Supraganglionic infarction (M4-M6 cortex): 3 Total score (0-10 with 10 being normal): 10 IMPRESSION: 1. No acute intracranial abnormality. 2. Aspects is 10. 3. Atrophy with moderately advanced chronic microvascular ischemic disease. 4. Large right mastoid effusion, similar to prior, likely chronic. Correlation with physical exam suggested. Results were  called by telephone at the time of interpretation on 11/09/2023 at 7:26 pm to provider [facility] , who verbally acknowledged these results. Electronically Signed   By: Virgia Griffins M.D.   On: 11/09/2023 19:27   Data Reviewed for HPI: Relevant notes from primary care and specialist visits, past discharge summaries as available in EHR, including Care Everywhere. Prior diagnostic testing as pertinent to current admission diagnoses Updated medications and problem lists for reconciliation ED course, including vitals, labs, imaging, treatment and response to treatment Triage notes, nursing and pharmacy notes and ED provider's notes Notable results as noted above in HPI      Assessment and Plan: * Migraine variant with headache Migraine cocktail as needed  Hypertensive encephalopathy Continue Cardene infusion and wean to home meds  History of CVA (cerebrovascular accident) MRI showing evidence of of remote infarcts and chronic microhemorrhages, suspect related to chronic fully uncontrolled hypertension Came in as a stroke alert and seen by teleneurology   Effusion of mastoid bone, right Migraine variant Possible migraine suggested by neurology Patient with recurrent headache, now with somnolence Pain control Unclear significance of right mastoid effusion Consider ENT consult in the a.m.  Chronic heart failure with preserved ejection fraction (HFpEF) (HCC) Not acutely decompensated Continue home Imdur , irbesartan  and hydralazine  as Cardene is being weaned  IDA (iron deficiency anemia) At baseline  ESRD on dialysis [facility]) Nephrology consult for continuation of dialysis        DVT prophylaxis: Lovenox  Consults: none  Advance Care Planning:   Code Status: Prior   Family Communication: none  Disposition Plan: Back to previous home environment  Severity of Illness: The appropriate patient status for this patient is INPATIENT. Inpatient status is judged to be  reasonable and necessary in order to provide the required intensity of service to ensure the patient's safety. The patient's presenting symptoms, physical exam findings, and initial radiographic and laboratory data in the context of their chronic comorbidities is felt to place them at high risk for further clinical deterioration. Furthermore, it is not anticipated that the patient will be medically stable for discharge from the hospital within 2 midnights of admission.   * I certify that at the point of admission it is my clinical judgment that the patient will require inpatient hospital care spanning beyond 2 midnights from the point of admission due to high intensity of service, high risk for further deterioration and high frequency of surveillance required.*  Author: Lanetta Pion, MD 11/10/2023 2:02 AM  For on call review [email or web address].

## 2023-11-10 NOTE — ED Notes (Signed)
 Continue Cardene infusion at 2 mg/hr for the time being - per Ward MD.

## 2023-11-10 NOTE — Progress Notes (Signed)
 [facility]  ROUNDING NOTE   Subjective:   Allison Hill is a 53 y.o. female, known to our practice from previous admission. She is an established dialysis patient who receives outpatient treatments at her rehab facility, [facility], on a MT WF schedule.  She is supervised by Dr. Rhesa Celeste.  Patient states she was brought to emergency department due to elevated blood pressure.  She states she was only given one of her blood pressure medicines prior to dialysis.  She did receive most of her treatment before presenting to emergency department.  She was taken off 40 minutes early.  Denies any current shortness of breath, she is on room air.  Denies pain.  Labs unremarkable for renal patient.  Hemoglobin is 8.3.  UA appears cloudy.  Tox screen negative.  All imaging negative for acute findings.  We have been consulted to manage dialysis needs during this admission.   Objective:  Vital signs in last 24 hours:  Temp:  [97.5 F (36.4 C)-98.4 F (36.9 C)] 98.4 F (36.9 C) (06/10 0930) Pulse Rate:  [70-88] 88 (06/10 1000) Resp:  [10-21] 19 (06/10 1000) BP: (128-221)/(85-140) 173/108 (06/10 1000) SpO2:  [99 %-100 %] 100 % (06/10 1000) Weight:  [77.1 kg-77.9 kg] 77.9 kg (06/10 0930)  Weight change:  Filed Weights   11/09/23 1905 11/10/23 0930  Weight: 77.1 kg 77.9 kg    Intake/Output: I/O last 3 completed shifts: In: 63.9 [I.V.:63.9] Out: 300 [Urine:300]   Intake/Output this shift:  Total I/O In: -  Out: 250 [Urine:250]  Physical Exam: General: NAD, laying in bed  Head: Normocephalic, atraumatic. Moist oral mucosal membranes  Eyes: Anicteric  Lungs:  Clear to auscultation  Heart: Regular rate and rhythm  Abdomen:  Soft, nontender  Extremities: Trace peripheral edema.  Neurologic: Alert and oriented, moving all four extremities  Skin: No lesions  Access: Rt chest permcath    Basic Metabolic Panel: Recent Labs  Lab 11/09/23 2001  NA 141  K 3.7  CL 101  CO2  27  GLUCOSE 106*  BUN 70*  CREATININE 5.31*  CALCIUM  9.2    Liver Function Tests: Recent Labs  Lab 11/09/23 2001  AST 25  ALT 16  ALKPHOS 286*  BILITOT 0.6  PROT 7.9  ALBUMIN 4.0   No results for input(s): "LIPASE", "AMYLASE" in the last 168 hours. No results for input(s): "AMMONIA" in the last 168 hours.  CBC: Recent Labs  Lab 11/09/23 2001  WBC 6.6  NEUTROABS 4.3  HGB 8.3*  HCT 25.1*  MCV 81.2  PLT 158    Cardiac Enzymes: No results for input(s): "CKTOTAL", "CKMB", "CKMBINDEX", "TROPONINI" in the last 168 hours.  BNP: Invalid input(s): "POCBNP"  CBG: Recent Labs  Lab 11/09/23 1910 11/10/23 0909  GLUCAP 102* 149*    Microbiology: Results for orders placed or performed during the hospital encounter of 11/09/23  MRSA Next Gen by PCR, Nasal     Status: Abnormal   Collection Time: 11/10/23  9:25 AM   Specimen: Nasal Mucosa; Nasal Swab  Result Value Ref Range Status   MRSA by PCR Next Gen DETECTED (A) NOT DETECTED Final    Comment: RESULT CALLED TO, READ BACK BY AND VERIFIED WITH:  MYRA FLOWERS RN 11/10/23 @1134  KKG (NOTE) The GeneXpert MRSA Assay (FDA approved for NASAL specimens only), is one component of a comprehensive MRSA colonization surveillance program. It is not intended to diagnose MRSA infection nor to guide or monitor treatment for MRSA infections. Test performance is not  FDA approved in patients less than 65 years old. Performed at [facility], [address] [place], [place] [address]     Coagulation Studies: Recent Labs    11/09/23 2001  LABPROT 13.1  INR 1.0    Urinalysis: Recent Labs    11/10/23 0012  COLORURINE COLORLESS*  LABSPEC 1.010  PHURINE 9.0*  GLUCOSEU 50*  HGBUR NEGATIVE  BILIRUBINUR NEGATIVE  KETONESUR NEGATIVE  PROTEINUR 30*  NITRITE NEGATIVE  LEUKOCYTESUR NEGATIVE      Imaging: MR BRAIN WO CONTRAST Result Date: 11/10/2023 CLINICAL DATA:  Initial evaluation for acute neuro deficit,  stroke suspected. EXAM: MRI HEAD WITHOUT CONTRAST TECHNIQUE: Multiplanar, multiecho pulse sequences of the brain and surrounding structures were obtained without intravenous contrast. COMPARISON:  CTs from earlier the same day. FINDINGS: Brain: Cerebral volume within normal limits. Patchy and confluent T2/FLAIR hyperintensity involving the periventricular and deep white matter both cerebral hemispheres as well as the pons, consistent with chronic small vessel ischemic disease, moderately advanced. Multiple remote lacunar infarcts present about the deep gray nuclei. No abnormal foci of restricted diffusion to suggest acute or subacute ischemia. Gray-white matter differentiation maintained. No areas of chronic cortical infarction. No acute intracranial hemorrhage. Multiple chronic micro hemorrhages noted about the cerebellum and thalami, most characteristic of chronic poorly controlled hypertension. No mass lesion, midline shift or mass effect. No hydrocephalus or extra-axial fluid collection. Pituitary gland within normal limits. Vascular: Major intracranial vascular flow voids are maintained. Skull and upper cervical spine: Craniocervical junction within normal limits. Diffuse loss of normal bone marrow signal, nonspecific but can be seen with anemia, smoking, obesity, and infiltrative/myelofibrotic marrow processes. No scalp soft tissue abnormality. Sinuses/Orbits: Globes orbital soft tissues within normal limits. Paranasal sinuses are largely clear. Large right mastoid effusion, of uncertain significance. Visualized nasopharynx unremarkable. Other: None. IMPRESSION: 1. No acute intracranial abnormality. 2. Moderately advanced chronic microvascular ischemic disease with multiple remote lacunar infarcts about the deep gray nuclei. 3. Multiple chronic micro hemorrhages about the cerebellum and thalami, most characteristic of chronic poorly controlled hypertension. 4. Large right mastoid effusion, of uncertain  significance. Correlation with physical exam recommended. Electronically Signed   By: Virgia Griffins M.D.   On: 11/10/2023 00:45   CT ANGIO HEAD NECK W WO CM W PERF (CODE STROKE) Result Date: 11/09/2023 CLINICAL DATA:  Initial evaluation for acute neuro deficit, stroke suspected. EXAM: CT ANGIOGRAPHY HEAD AND NECK CT PERFUSION BRAIN TECHNIQUE: Multidetector CT imaging of the head and neck was performed using the standard protocol during bolus administration of intravenous contrast. Multiplanar CT image reconstructions and MIPs were obtained to evaluate the vascular anatomy. Carotid stenosis measurements (when applicable) are obtained utilizing NASCET criteria, using the distal internal carotid diameter as the denominator. Multiphase CT imaging of the brain was performed following IV bolus contrast injection. Subsequent parametric perfusion maps were calculated using RAPID software. RADIATION DOSE REDUCTION: This exam was performed according to the departmental dose-optimization program which includes automated exposure control, adjustment of the mA and/or kV according to patient size and/or use of iterative reconstruction technique. CONTRAST:  100mL OMNIPAQUE IOHEXOL 350 MG/ML SOLN COMPARISON:  CT from earlier the same day. FINDINGS: CTA NECK FINDINGS Aortic arch: Visualized aortic arch within normal limits for caliber with standard 3 vessel morphology. Aortic atherosclerosis. No significant stenosis about the origin of the great vessels. Right carotid system: Right common and internal carotid arteries are tortuous but patent without stenosis or dissection. Left carotid system: Left common and internal carotid arteries are tortuous but  patent without stenosis or dissection. Vertebral arteries: Both vertebral arteries arise from the subclavian arteries. Left vertebral artery dominant. Vertebral arteries patent without stenosis or dissection. Skeleton: No worrisome osseous lesions. Moderate spondylosis at  C5-6. Other neck: No other acute finding. Right IJ approach central venous catheter in place. Upper chest: No other acute finding. Review of the MIP images confirms the above findings CTA HEAD FINDINGS Anterior circulation: Inch mild atheromatous change about the carotid siphons without stenosis. A1 segments patent bilaterally. Normal anterior communicating artery complex. Atheromatous irregularity about the ACAs without proximal high-grade stenosis. No M1 stenosis or occlusion. Distal MCA branches perfused and symmetric. Posterior circulation: Vertebrobasilar system with somewhat dolichoectatic. Both V4 segments patent without stenosis. Both PICA grossly patent at their origins. Basilar mildly irregular but patent without stenosis. Superior cerebellar and posterior cerebral arteries patent bilaterally. Venous sinuses: Patent allowing for timing the contrast bolus. Anatomic variants: None significant.  No aneurysm. Review of the MIP images confirms the above findings CT Brain Perfusion Findings: ASPECTS: 10 CBF (<30%) Volume: 0mL Perfusion (Tmax>6.0s) volume: 0mL Mismatch Volume: 0mL Infarction Location:Negative CT perfusion with no evidence for acute ischemia or other perfusion abnormality. IMPRESSION: 1. Negative CTA of the head and neck. No large vessel occlusion or other emergent finding. 2. Negative CT perfusion with no evidence for acute ischemia or other perfusion abnormality. 3. Diffuse tortuosity of the major arterial vasculature of the head and neck, suggesting chronic underlying hypertension. No hemodynamically significant or correctable stenosis. 4.  Aortic Atherosclerosis (ICD10-I70.0). Electronically Signed   By: Virgia Griffins M.D.   On: 11/09/2023 21:43   CT HEAD CODE STROKE WO CONTRAST Result Date: 11/09/2023 CLINICAL DATA:  Code stroke. Initial evaluation for acute neuro deficit, stroke suspected. EXAM: CT HEAD WITHOUT CONTRAST TECHNIQUE: Contiguous axial images were obtained from the base  of the skull through the vertex without intravenous contrast. RADIATION DOSE REDUCTION: This exam was performed according to the departmental dose-optimization program which includes automated exposure control, adjustment of the mA and/or kV according to patient size and/or use of iterative reconstruction technique. COMPARISON:  Prior study from 10/24/2023 FINDINGS: Brain: Mild age-related cerebral atrophy. Patchy hypodensity involving the supratentorial cerebral white matter, consistent chronic small vessel ischemic disease, moderately advanced in nature. Remote lacunar infarcts present about the thalami. No acute intracranial hemorrhage. No acute large vessel territory infarct. No mass lesion or midline shift. No hydrocephalus or extra-axial fluid collection. Vascular: No abnormal hyperdense vessel. Scattered vascular calcifications noted within the carotid siphons. Skull: Scalp soft tissues within normal limits.  Calvarium intact. Sinuses/Orbits: Globes orbital soft tissues within normal limits. Paranasal sinuses are clear. Large right mastoid effusion, similar to prior, and likely chronic. Left mastoid air cells are clear. Other: None. ASPECTS [facility] Stroke Program Early CT Score) - Ganglionic level infarction (caudate, lentiform nuclei, internal capsule, insula, M1-M3 cortex): 7 - Supraganglionic infarction (M4-M6 cortex): 3 Total score (0-10 with 10 being normal): 10 IMPRESSION: 1. No acute intracranial abnormality. 2. Aspects is 10. 3. Atrophy with moderately advanced chronic microvascular ischemic disease. 4. Large right mastoid effusion, similar to prior, likely chronic. Correlation with physical exam suggested. Results were called by telephone at the time of interpretation on 11/09/2023 at 7:26 pm to provider [facility] , who verbally acknowledged these results. Electronically Signed   By: Virgia Griffins M.D.   On: 11/09/2023 19:27      Medications:     acetaminophen   1,000 mg Oral Once    amLODipine   10 mg Oral Daily  aspirin  EC  81 mg Oral Daily   atorvastatin   40 mg Oral Daily   Chlorhexidine  Gluconate Cloth  6 each Topical Daily   [START ON 11/16/2023] cloNIDine   0.3 mg Transdermal Weekly   gabapentin   200 mg Oral TID   heparin  injection (subcutaneous)  5,000 Units Subcutaneous Q8H   hydrALAZINE   100 mg Oral TID   irbesartan   300 mg Oral QPM   isosorbide  mononitrate  120 mg Oral Daily   melatonin  5 mg Oral QHS   sevelamer  carbonate  1,600 mg Oral TID WC   acetaminophen  **OR** acetaminophen , HYDROcodone -acetaminophen , ondansetron  **OR** ondansetron  (ZOFRAN ) IV  Assessment/ Plan:  Ms. Lakisha Garlitz is a 53 y.o.  female  with end stage renal disease on hemodialysis, hypertension, CHF, anemia, and vertigo who is admitted to [facility] on 11/09/2023 for Hypertensive encephalopathy [I67.4] Aphasia [R47.01] Other migraine without status migrainosus, not intractable [G43.809]  [facility] rehab/MTWF/PC      1.  End stage renal disease on hemodialysis.  Patient reports missing treatment 1 day last week due to not feeling well.  Treatment terminated 40 minutes early prior to ED arrival due to hypertension.  Labs appear stable.  Next treatment scheduled for Wednesday.  Will maintain MWF schedule during this admission.     2. Anemia of chronic kidney disease Hemoglobin & Hematocrit     Component Value Date/Time   HGB 8.3 (L) 11/09/2023 2001   HCT 25.1 (L) 11/09/2023 2001  Hemoglobin is decreased today, 8.3.  Will order Epogen  with dialysis tomorrow.   3.  Hypertension of chronic disease:    - Home regimen of hydralazine , irbesartan , isosorbide  mononitrate, terazosin , amlodipine , and labetalol .   - Blood pressure elevated on ED arrival, appears to peak at 220/127.  Currently in ICU with 173/108.  Was placed on nicardipine drip and emergency department.               4. Secondary Hyperparathyroidism:    Lab Results  Component Value Date   PTH 1,248 (H) 08/11/2023    CALCIUM  9.2 11/09/2023   PHOS 5.2 (H) 10/26/2023    - Monitoring bone minerals during this admission. - Continue calcium  carbonate, calcitriol , Renvela , and cinacalcet .     LOS: 0 Ellen Goris 6/10/202511:52 AM

## 2023-11-10 NOTE — Assessment & Plan Note (Signed)
 Migraine cocktail as needed

## 2023-11-10 NOTE — Assessment & Plan Note (Signed)
 Migraine variant Possible migraine suggested by neurology Patient with recurrent headache, now with somnolence Pain control Unclear significance of right mastoid effusion Consider ENT consult in the a.m.
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
## 2023-11-10 NOTE — Assessment & Plan Note (Signed)
 MRI showing evidence of of remote infarcts and chronic microhemorrhages, suspect related to chronic fully uncontrolled hypertension Came in as a stroke alert and seen by teleneurology

## 2023-11-10 NOTE — TOC Progression Note (Signed)
 Transition of Care [facility]) - Progression Note    Patient Details  Name: Pranavi Aure MRN: [id number] Date of Birth: 20-Jun-1970  [facility] CM/SW Contact  Elester Apodaca A Johngabriel Verde, RN Phone Number: 11/10/2023, 11:19 AM  Clinical Narrative:    Chart reviewed.  Noted that patient was admitted with Migraine variant with headache and hypertensive encephalopathy.    I have spoken with [facility] Admissions Coordinator at [facility].  Willard Harman reports that patient is a long term care resident of [facility] is and is able to return to [facility] when stable for discharge.  I have informed Willard Harman that patient will be a tentative discharge back to the facility on tomorrow.    TOC will continue to follow for discharge planning.          Expected Discharge Plan and Services                                               Social Determinants of Health (SDOH) Interventions SDOH Screenings   Food Insecurity: No Food Insecurity (11/10/2023)  Housing: Low Risk  (11/10/2023)  Transportation Needs: No Transportation Needs (11/10/2023)  Utilities: Not At Risk (11/10/2023)  Financial Resource Strain: Patient Declined (01/03/2023)   [facility] from [facility]  Social Connections: Socially Isolated (11/10/2023)  Tobacco Use: High Risk (11/09/2023)    Readmission Risk Interventions     No data to display

## 2023-11-11 DIAGNOSIS — Z992 Dependence on renal dialysis: Secondary | ICD-10-CM | POA: Diagnosis not present

## 2023-11-11 DIAGNOSIS — I674 Hypertensive encephalopathy: Secondary | ICD-10-CM | POA: Diagnosis not present

## 2023-11-11 DIAGNOSIS — E663 Overweight: Secondary | ICD-10-CM | POA: Insufficient documentation

## 2023-11-11 DIAGNOSIS — G43809 Other migraine, not intractable, without status migrainosus: Secondary | ICD-10-CM | POA: Diagnosis not present

## 2023-11-11 DIAGNOSIS — N186 End stage renal disease: Secondary | ICD-10-CM

## 2023-11-11 LAB — RENAL FUNCTION PANEL
Albumin: 3.4 g/dL — ABNORMAL LOW (ref 3.5–5.0)
Anion gap: 11 (ref 5–15)
BUN: 79 mg/dL — ABNORMAL HIGH (ref 6–20)
CO2: 26 mmol/L (ref 22–32)
Calcium: 8.8 mg/dL — ABNORMAL LOW (ref 8.9–10.3)
Chloride: 102 mmol/L (ref 98–111)
Creatinine, Ser: 6.69 mg/dL — ABNORMAL HIGH (ref 0.44–1.00)
GFR, Estimated: 7 mL/min — ABNORMAL LOW (ref >60)
Glucose, Bld: 102 mg/dL — ABNORMAL HIGH (ref 70–99)
Phosphorus: 5.1 mg/dL — ABNORMAL HIGH (ref 2.5–4.6)
Potassium: 4.1 mmol/L (ref 3.5–5.1)
Sodium: 139 mmol/L (ref 135–145)

## 2023-11-11 LAB — CBC
HCT: 24.1 % — ABNORMAL LOW (ref 36.0–46.0)
Hemoglobin: 8.1 g/dL — ABNORMAL LOW (ref 12.0–15.0)
MCH: 26.9 pg (ref 26.0–34.0)
MCHC: 33.6 g/dL (ref 30.0–36.0)
MCV: 80.1 fL (ref 80.0–100.0)
Platelets: 134 10*3/uL — ABNORMAL LOW (ref 150–400)
RBC: 3.01 MIL/uL — ABNORMAL LOW (ref 3.87–5.11)
RDW: 17.3 % — ABNORMAL HIGH (ref 11.5–15.5)
WBC: 5.7 10*3/uL (ref 4.0–10.5)
nRBC: 0 % (ref 0.0–0.2)

## 2023-11-11 MED ORDER — ALTEPLASE 2 MG IJ SOLR: 2.0000 mg | Freq: Once | INTRAMUSCULAR | Status: DC | PRN

## 2023-11-11 MED ORDER — EPOETIN ALFA-EPBX 10000 UNIT/ML IJ SOLN
10000.0000 [IU] | INTRAMUSCULAR | Status: DC
  Administered 2023-11-11: 10000 [IU] via INTRAVENOUS

## 2023-11-11 MED ORDER — EPOETIN ALFA-EPBX 10000 UNIT/ML IJ SOLN
INTRAMUSCULAR | Status: AC
  Filled 2023-11-11: qty 1

## 2023-11-11 MED ORDER — TERAZOSIN HCL 2 MG PO CAPS
2.0000 mg | ORAL_CAPSULE | Freq: Two times a day (BID) | ORAL | Status: DC
  Filled 2023-11-11 (×2): qty 1

## 2023-11-11 MED ORDER — HEPARIN SODIUM (PORCINE) 1000 UNIT/ML DIALYSIS: 1000.0000 [IU] | INTRAMUSCULAR | Status: DC | PRN

## 2023-11-11 NOTE — Progress Notes (Signed)
   11/11/23 1205  Vitals  BP (!) 169/105  BP Location Left Arm  BP Method Automatic  Patient Position (if appropriate) Lying  Pulse Rate 90  Pulse Rate Source Monitor  Resp (!) 21  Oxygen Therapy  SpO2 100 %  O2 Device Room Air  Patient Activity (if Appropriate) In bed  Pulse Oximetry Type Continuous  During Treatment Monitoring  Blood Flow Rate (mL/min) 163 mL/min  Arterial Pressure (mmHg) -115.75 mmHg  Venous Pressure (mmHg) 20 mmHg  TMP (mmHg) 10.3 mmHg  Ultrafiltration Rate (mL/min) 0 mL/min  Dialysate Flow Rate (mL/min) 299 ml/min  Dialysate Potassium Concentration 3  Dialysate Calcium  Concentration 2.5  Duration of HD Treatment -hour(s) 2.77 hour(s)  Cumulative Fluid Removed (mL) per Treatment  527.51  HD Safety Checks Performed Yes  Intra-Hemodialysis Comments Tx completed   Received patient in bed to unit.  Alert and oriented.  Informed consent signed and in chart.   TX duration:  Patient tolerated well.  Transported back to the room  Alert, without acute distress.  Hand-off given to patient's nurse.   Access used: L HD cath Access issues: none  Total UF removed: 400 Medication(s) given: epoetin  10000 units Post HD VS: see above Post HD weight: n/a   [facility]

## 2023-11-11 NOTE — Progress Notes (Signed)
 [facility]  ROUNDING NOTE   Subjective:   Allison Hill is a 53 y.o. female, known to our practice from previous admission. She is an established dialysis patient who receives outpatient treatments at her rehab facility, [facility], on a MT WF schedule.  She is supervised by Dr. Rhesa Celeste.  Patient states she was brought to emergency department due to elevated blood pressure.   Update Patient seen and evaluated during dialysis   HEMODIALYSIS FLOWSHEET:  Blood Flow Rate (mL/min): 350 mL/min Arterial Pressure (mmHg): -126.05 mmHg Venous Pressure (mmHg): 158.17 mmHg TMP (mmHg): 20.4 mmHg Ultrafiltration Rate (mL/min): 829 mL/min Dialysate Flow Rate (mL/min): 299 ml/min  States she feels well Tolerating meals Room air   Objective:  Vital signs in last 24 hours:  Temp:  [97.6 F (36.4 C)-98.6 F (37 C)] 98.6 F (37 C) (06/11 0855) Pulse Rate:  [83-99] 89 (06/11 1030) Resp:  [15-24] 19 (06/11 1030) BP: (141-187)/(82-119) 157/104 (06/11 1030) SpO2:  [95 %-100 %] 100 % (06/11 1030) Weight:  [74.8 kg] 74.8 kg (06/11 0855)  Weight change: 0.788 kg Filed Weights   11/09/23 1905 11/10/23 0930 11/11/23 0855  Weight: 77.1 kg 77.9 kg 74.8 kg    Intake/Output: I/O last 3 completed shifts: In: 63.9 [I.V.:63.9] Out: 750 [Urine:750]   Intake/Output this shift:  No intake/output data recorded.  Physical Exam: General: NAD, laying in bed  Head: Normocephalic, atraumatic. Moist oral mucosal membranes  Eyes: Anicteric  Lungs:  Clear to auscultation  Heart: Regular rate and rhythm  Abdomen:  Soft, nontender  Extremities: Trace peripheral edema.  Neurologic: Alert and oriented, moving all four extremities  Skin: No lesions  Access: Rt chest permcath    Basic Metabolic Panel: Recent Labs  Lab 11/09/23 2001 11/11/23 0753  NA 141 139  K 3.7 4.1  CL 101 102  CO2 27 26  GLUCOSE 106* 102*  BUN 70* 79*  CREATININE 5.31* 6.69*  CALCIUM  9.2 8.8*  PHOS  --  5.1*     Liver Function Tests: Recent Labs  Lab 11/09/23 2001 11/11/23 0753  AST 25  --   ALT 16  --   ALKPHOS 286*  --   BILITOT 0.6  --   PROT 7.9  --   ALBUMIN 4.0 3.4*   No results for input(s): LIPASE, AMYLASE in the last 168 hours. No results for input(s): AMMONIA in the last 168 hours.  CBC: Recent Labs  Lab 11/09/23 2001 11/11/23 0753  WBC 6.6 5.7  NEUTROABS 4.3  --   HGB 8.3* 8.1*  HCT 25.1* 24.1*  MCV 81.2 80.1  PLT 158 134*    Cardiac Enzymes: No results for input(s): CKTOTAL, CKMB, CKMBINDEX, TROPONINI in the last 168 hours.  BNP: Invalid input(s): POCBNP  CBG: Recent Labs  Lab 11/09/23 1910 11/10/23 0909  GLUCAP 102* 149*    Microbiology: Results for orders placed or performed during the hospital encounter of 11/09/23  MRSA Next Gen by PCR, Nasal     Status: Abnormal   Collection Time: 11/10/23  9:25 AM   Specimen: Nasal Mucosa; Nasal Swab  Result Value Ref Range Status   MRSA by PCR Next Gen DETECTED (A) NOT DETECTED Final    Comment: RESULT CALLED TO, READ BACK BY AND VERIFIED WITH:  MYRA FLOWERS RN 11/10/23 @1134  KKG (NOTE) The GeneXpert MRSA Assay (FDA approved for NASAL specimens only), is one component of a comprehensive MRSA colonization surveillance program. It is not intended to diagnose MRSA infection nor to guide or  monitor treatment for MRSA infections. Test performance is not FDA approved in patients less than 70 years old. Performed at [facility], [address] [place], [place] [address]     Coagulation Studies: Recent Labs    11/09/23 2001  LABPROT 13.1  INR 1.0    Urinalysis: Recent Labs    11/10/23 0012  COLORURINE COLORLESS*  LABSPEC 1.010  PHURINE 9.0*  GLUCOSEU 50*  HGBUR NEGATIVE  BILIRUBINUR NEGATIVE  KETONESUR NEGATIVE  PROTEINUR 30*  NITRITE NEGATIVE  LEUKOCYTESUR NEGATIVE      Imaging: MR BRAIN WO CONTRAST Result Date: 11/10/2023 CLINICAL DATA:  Initial evaluation  for acute neuro deficit, stroke suspected. EXAM: MRI HEAD WITHOUT CONTRAST TECHNIQUE: Multiplanar, multiecho pulse sequences of the brain and surrounding structures were obtained without intravenous contrast. COMPARISON:  CTs from earlier the same day. FINDINGS: Brain: Cerebral volume within normal limits. Patchy and confluent T2/FLAIR hyperintensity involving the periventricular and deep white matter both cerebral hemispheres as well as the pons, consistent with chronic small vessel ischemic disease, moderately advanced. Multiple remote lacunar infarcts present about the deep gray nuclei. No abnormal foci of restricted diffusion to suggest acute or subacute ischemia. Gray-white matter differentiation maintained. No areas of chronic cortical infarction. No acute intracranial hemorrhage. Multiple chronic micro hemorrhages noted about the cerebellum and thalami, most characteristic of chronic poorly controlled hypertension. No mass lesion, midline shift or mass effect. No hydrocephalus or extra-axial fluid collection. Pituitary gland within normal limits. Vascular: Major intracranial vascular flow voids are maintained. Skull and upper cervical spine: Craniocervical junction within normal limits. Diffuse loss of normal bone marrow signal, nonspecific but can be seen with anemia, smoking, obesity, and infiltrative/myelofibrotic marrow processes. No scalp soft tissue abnormality. Sinuses/Orbits: Globes orbital soft tissues within normal limits. Paranasal sinuses are largely clear. Large right mastoid effusion, of uncertain significance. Visualized nasopharynx unremarkable. Other: None. IMPRESSION: 1. No acute intracranial abnormality. 2. Moderately advanced chronic microvascular ischemic disease with multiple remote lacunar infarcts about the deep gray nuclei. 3. Multiple chronic micro hemorrhages about the cerebellum and thalami, most characteristic of chronic poorly controlled hypertension. 4. Large right mastoid  effusion, of uncertain significance. Correlation with physical exam recommended. Electronically Signed   By: Virgia Griffins M.D.   On: 11/10/2023 00:45   CT ANGIO HEAD NECK W WO CM W PERF (CODE STROKE) Result Date: 11/09/2023 CLINICAL DATA:  Initial evaluation for acute neuro deficit, stroke suspected. EXAM: CT ANGIOGRAPHY HEAD AND NECK CT PERFUSION BRAIN TECHNIQUE: Multidetector CT imaging of the head and neck was performed using the standard protocol during bolus administration of intravenous contrast. Multiplanar CT image reconstructions and MIPs were obtained to evaluate the vascular anatomy. Carotid stenosis measurements (when applicable) are obtained utilizing NASCET criteria, using the distal internal carotid diameter as the denominator. Multiphase CT imaging of the brain was performed following IV bolus contrast injection. Subsequent parametric perfusion maps were calculated using RAPID software. RADIATION DOSE REDUCTION: This exam was performed according to the departmental dose-optimization program which includes automated exposure control, adjustment of the mA and/or kV according to patient size and/or use of iterative reconstruction technique. CONTRAST:  100mL OMNIPAQUE IOHEXOL 350 MG/ML SOLN COMPARISON:  CT from earlier the same day. FINDINGS: CTA NECK FINDINGS Aortic arch: Visualized aortic arch within normal limits for caliber with standard 3 vessel morphology. Aortic atherosclerosis. No significant stenosis about the origin of the great vessels. Right carotid system: Right common and internal carotid arteries are tortuous but patent without stenosis or dissection. Left carotid system:  Left common and internal carotid arteries are tortuous but patent without stenosis or dissection. Vertebral arteries: Both vertebral arteries arise from the subclavian arteries. Left vertebral artery dominant. Vertebral arteries patent without stenosis or dissection. Skeleton: No worrisome osseous lesions.  Moderate spondylosis at C5-6. Other neck: No other acute finding. Right IJ approach central venous catheter in place. Upper chest: No other acute finding. Review of the MIP images confirms the above findings CTA HEAD FINDINGS Anterior circulation: Inch mild atheromatous change about the carotid siphons without stenosis. A1 segments patent bilaterally. Normal anterior communicating artery complex. Atheromatous irregularity about the ACAs without proximal high-grade stenosis. No M1 stenosis or occlusion. Distal MCA branches perfused and symmetric. Posterior circulation: Vertebrobasilar system with somewhat dolichoectatic. Both V4 segments patent without stenosis. Both PICA grossly patent at their origins. Basilar mildly irregular but patent without stenosis. Superior cerebellar and posterior cerebral arteries patent bilaterally. Venous sinuses: Patent allowing for timing the contrast bolus. Anatomic variants: None significant.  No aneurysm. Review of the MIP images confirms the above findings CT Brain Perfusion Findings: ASPECTS: 10 CBF (<30%) Volume: 0mL Perfusion (Tmax>6.0s) volume: 0mL Mismatch Volume: 0mL Infarction Location:Negative CT perfusion with no evidence for acute ischemia or other perfusion abnormality. IMPRESSION: 1. Negative CTA of the head and neck. No large vessel occlusion or other emergent finding. 2. Negative CT perfusion with no evidence for acute ischemia or other perfusion abnormality. 3. Diffuse tortuosity of the major arterial vasculature of the head and neck, suggesting chronic underlying hypertension. No hemodynamically significant or correctable stenosis. 4.  Aortic Atherosclerosis (ICD10-I70.0). Electronically Signed   By: Virgia Griffins M.D.   On: 11/09/2023 21:43   CT HEAD CODE STROKE WO CONTRAST Result Date: 11/09/2023 CLINICAL DATA:  Code stroke. Initial evaluation for acute neuro deficit, stroke suspected. EXAM: CT HEAD WITHOUT CONTRAST TECHNIQUE: Contiguous axial images were  obtained from the base of the skull through the vertex without intravenous contrast. RADIATION DOSE REDUCTION: This exam was performed according to the departmental dose-optimization program which includes automated exposure control, adjustment of the mA and/or kV according to patient size and/or use of iterative reconstruction technique. COMPARISON:  Prior study from 10/24/2023 FINDINGS: Brain: Mild age-related cerebral atrophy. Patchy hypodensity involving the supratentorial cerebral white matter, consistent chronic small vessel ischemic disease, moderately advanced in nature. Remote lacunar infarcts present about the thalami. No acute intracranial hemorrhage. No acute large vessel territory infarct. No mass lesion or midline shift. No hydrocephalus or extra-axial fluid collection. Vascular: No abnormal hyperdense vessel. Scattered vascular calcifications noted within the carotid siphons. Skull: Scalp soft tissues within normal limits.  Calvarium intact. Sinuses/Orbits: Globes orbital soft tissues within normal limits. Paranasal sinuses are clear. Large right mastoid effusion, similar to prior, and likely chronic. Left mastoid air cells are clear. Other: None. ASPECTS [facility] Stroke Program Early CT Score) - Ganglionic level infarction (caudate, lentiform nuclei, internal capsule, insula, M1-M3 cortex): 7 - Supraganglionic infarction (M4-M6 cortex): 3 Total score (0-10 with 10 being normal): 10 IMPRESSION: 1. No acute intracranial abnormality. 2. Aspects is 10. 3. Atrophy with moderately advanced chronic microvascular ischemic disease. 4. Large right mastoid effusion, similar to prior, likely chronic. Correlation with physical exam suggested. Results were called by telephone at the time of interpretation on 11/09/2023 at 7:26 pm to provider [facility] , who verbally acknowledged these results. Electronically Signed   By: Virgia Griffins M.D.   On: 11/09/2023 19:27      Medications:     acetaminophen    1,000 mg Oral Once  amLODipine   10 mg Oral Daily   aspirin  EC  81 mg Oral Daily   atorvastatin   40 mg Oral Daily   Chlorhexidine  Gluconate Cloth  6 each Topical Daily   Chlorhexidine  Gluconate Cloth  6 each Topical Q0600   [START ON 11/16/2023] cloNIDine   0.3 mg Transdermal Weekly   gabapentin   200 mg Oral TID   heparin  injection (subcutaneous)  5,000 Units Subcutaneous Q8H   hydrALAZINE   100 mg Oral TID   irbesartan   300 mg Oral QPM   isosorbide  mononitrate  120 mg Oral Daily   melatonin  5 mg Oral QHS   sevelamer  carbonate  1,600 mg Oral TID WC   terazosin   2 mg Oral BID   acetaminophen  **OR** acetaminophen , alteplase , heparin , HYDROcodone -acetaminophen , ondansetron  **OR** ondansetron  (ZOFRAN ) IV  Assessment/ Plan:  [name] is a 53 y.o.  female  with end stage renal disease on hemodialysis, hypertension, CHF, anemia, and vertigo who is admitted to [facility] on 11/09/2023 for Hypertensive encephalopathy [I67.4] Aphasia [R47.01] Other migraine without status migrainosus, not intractable [G43.809]  [facility] rehab/[facility]/PC      1.  End stage renal disease on hemodialysis.  Patient receiving scheduled dialysis today, UF goal 2 L as tolerated.  Next treatment scheduled for Friday.   2. Anemia of chronic kidney disease Hemoglobin & Hematocrit     Component Value Date/Time   HGB 8.1 (L) 11/11/2023 0753   HCT 24.1 (L) 11/11/2023 0753  Hemoglobin is decreased today, 8.1.  Will order Epogen  10000 units IV with dialysis.   3.  Hypertension of chronic disease:    - Home regimen of hydralazine , irbesartan , isosorbide  mononitrate, terazosin , amlodipine , and labetalol .   - Blood pressure elevated on ED arrival, appears to peak at 220/127.    - Blood pressure currently 154/94 during dialysis.            -Will determine which medications patient is cleared to receive prior to dialysis.   4. Secondary Hyperparathyroidism:    Lab Results  Component Value Date   PTH 1,248 (H)  08/11/2023   CALCIUM  8.8 (L) 11/11/2023   PHOS 5.1 (H) 11/11/2023    - Monitoring bone minerals during this admission. - Continue calcium  carbonate, calcitriol , Renvela , and cinacalcet .     LOS: 1 Allison Hill 6/11/202510:50 AM

## 2023-11-11 NOTE — Progress Notes (Signed)
 PT Cancellation Note  Patient Details Name: Allison Hill MRN: [id number] DOB: [birth year]   Cancelled Treatment:    Reason Eval/Treat Not Completed: Other (comment), Consult received and chart reviewed. Pt from LTC and is currently at baseline. Secure chat sent to [facility], no acute PT eval needed for return to LTC. Will complete current order. Please re-order if needs change.    Tyge Somers Romero-Perozo 11/11/2023, 9:10 AM

## 2023-11-11 NOTE — TOC Transition Note (Signed)
 Transition of Care [facility]) - Discharge Note   Patient Details  Name: Allison Hill MRN: [id number] Date of Birth: 1970/09/17  Transition of Care [facility] CM/SW Contact:  Odilia Bennett, LCSW Phone Number: 11/11/2023, 2:40 PM   Clinical Narrative:  Patient has orders to discharge home today. RN will call report to [phone] (Room F-15). [facility] has been arranged and she is 4th on the list. Patient said she already notified her daughter of plan to discharge today. No further concerns. CSW signing off.   Final next level of care: Skilled Nursing Facility Barriers to Discharge: Barriers Resolved   Patient Goals and CMS Choice            Discharge Placement   Existing PASRR number confirmed : 11/10/23          Patient chooses bed at: Other - please specify in the comment section below: ([facility]) Patient to be transferred to facility by: [facility]   Patient and family notified of of transfer: 11/11/23  Discharge Plan and Services Additional resources added to the After Visit Summary for                                       Social Drivers of Health (SDOH) Interventions SDOH Screenings   Food Insecurity: No Food Insecurity (11/10/2023)  Housing: Low Risk  (11/10/2023)  Transportation Needs: No Transportation Needs (11/10/2023)  Utilities: Not At Risk (11/10/2023)  Financial Resource Strain: Patient Declined (01/03/2023)   Received from [facility]  Social Connections: Socially Isolated (11/10/2023)  Tobacco Use: High Risk (11/09/2023)     Readmission Risk Interventions     No data to display

## 2023-11-11 NOTE — Discharge Summary (Signed)
 Physician Discharge Summary   Patient: Allison Hill MRN: [id number] DOB: [birth year]  Admit date:     11/09/2023  Discharge date: 11/11/23  Discharge Physician: Donaciano Frizzle   PCP: Valerie Gather, MD   Recommendations at discharge:   Follow-up with PCP in 1 week.  Discharge Diagnoses: Principal Problem:   Migraine variant with headache Active Problems:   Hypertension   Hypertensive encephalopathy   History of CVA (cerebrovascular accident)   Effusion of mastoid bone, right   Chronic heart failure with preserved ejection fraction (HFpEF) (HCC)   IDA (iron deficiency anemia)   ESRD on dialysis (HCC)   Polysubstance (excluding opioids) dependence with physiological dependence (HCC)   Overweight (BMI 25.0-29.9)  Resolved Problems:   * No resolved hospital problems. [facility] Course: Allison Hill is a 53 y.o. female with medical history significant for ESRD on HD MWF, uncontrolled HTN, chronic HFpEF, chronic IDA, recently admitted from 5/24 to 10/27/2023 with intractable peripheral vertigo, with MRI showing remote infarcts and chronic microhemorrhages without acute stroke who returns to the ED as a stroke alert,,presenting with headache and somnolence and blood pressures as high as 201/120.  Repeated MRI negative for acute stroke. Her condition is more likely due to hypertension encephalopathy, she into show he was treated with Cardene drip, blood pressure is much better.  Restart all home medications.  Patient blood pressure is more stabilized after dialysis.  Currently patient is medically stable for discharge.   Assessment and Plan: * Migraine variant with headache Migraine cocktail as needed was given.  Headache much improved.  Hypertensive encephalopathy Continue Cardene infusion and wean to home meds.  Blood pressure much better after receiving hemodialysis.  History of CVA (cerebrovascular accident) MRI showing evidence of of remote infarcts and chronic  microhemorrhages, suspect related to chronic fully uncontrolled hypertension No acute changes. Patient will be followed with nephrology during dialysis to adjust blood pressure medicine dose.   Effusion of mastoid bone, right Migraine variant Possible migraine suggested by neurology CT scan also showed a mastoid effusion, but no change compared to previous CT scan.  Does not seem to be a infection.  Chronic heart failure with preserved ejection fraction (HFpEF) (HCC) Not acutely decompensated  IDA (iron deficiency anemia) At baseline  ESRD on dialysis [facility]) Patient is dialyzed again today.        Consultants: Nephrology Procedures performed: HD  Disposition: [facility] Diet recommendation:  Discharge Diet Orders (From admission, onward)     Start     Ordered   11/11/23 0000  Diet general       Comments: Renal diet   11/11/23 1324           Renal diet DISCHARGE MEDICATION: Allergies as of 11/11/2023       Reactions   Ceftriaxone Shortness Of Breath, Hives   Allergy, hives   Shellfish Allergy Hives   Minoxidil Swelling        Medication List     TAKE these medications    acetaminophen  500 MG tablet Commonly known as: TYLENOL  Take 500 mg by mouth 3 (three) times daily.   amLODipine  10 MG tablet Commonly known as: NORVASC  Take 10 mg by mouth daily.   aspirin  EC 81 MG tablet Take 81 mg by mouth daily.   atorvastatin  40 MG tablet Commonly known as: LIPITOR Take 40 mg by mouth daily.   calcitRIOL  0.25 MCG capsule Commonly known as: ROCALTROL  Take 1 capsule (0.25 mcg total) by mouth Every Tuesday,Thursday,and Saturday with dialysis.  What changed:  when to take this additional instructions   calcium  carbonate 500 MG chewable tablet Commonly known as: TUMS - dosed in mg elemental calcium  Chew 2 tablets (400 mg of elemental calcium  total) by mouth 2 (two) times daily.   cinacalcet  30 MG tablet Commonly known as: SENSIPAR  Take 1 tablet (30 mg  total) by mouth daily with supper.   cloNIDine  0.1 MG tablet Commonly known as: CATAPRES  Take 0.1 mg by mouth 2 (two) times daily as needed (Diastolic BP greater than 100 with manual BP).   cloNIDine  0.3 mg/24hr patch Commonly known as: CATAPRES  - Dosed in mg/24 hr Place 1 patch onto the skin once a week. On Fridays   epoetin  alfa 4000 UNIT/ML injection Commonly known as: EPOGEN  Inject 1 mL (4,000 Units total) into the vein Every Tuesday,Thursday,and Saturday with dialysis. What changed:  when to take this additional instructions   fluticasone  50 MCG/ACT nasal spray Commonly known as: FLONASE  Place 1 spray into both nostrils daily as needed for allergies or rhinitis.   gabapentin  100 MG capsule Commonly known as: NEURONTIN  Take 200 mg by mouth 3 (three) times daily.   hydrALAZINE  100 MG tablet Commonly known as: APRESOLINE  Take 100 mg by mouth 3 (three) times daily.   irbesartan  300 MG tablet Commonly known as: AVAPRO  Take 1 tablet (300 mg total) by mouth every evening.   isosorbide  mononitrate 120 MG 24 hr tablet Commonly known as: IMDUR  Take 120 mg by mouth daily.   meclizine  25 MG tablet Commonly known as: ANTIVERT  Take 1 tablet (25 mg total) by mouth 2 (two) times daily as needed for dizziness.   melatonin 3 MG Tabs tablet Take 6 mg by mouth at bedtime.   nitroGLYCERIN 0.4 MG SL tablet Commonly known as: NITROSTAT Place under the tongue.   pantoprazole  40 MG tablet Commonly known as: PROTONIX  Take 1 tablet (40 mg total) by mouth daily.   polyethylene glycol 17 g packet Commonly known as: MIRALAX  / GLYCOLAX  Take 17 g by mouth daily as needed.   senna-docusate 8.6-50 MG tablet Commonly known as: Senokot-S Take 2 tablets by mouth 2 (two) times daily as needed for mild constipation.   sevelamer  carbonate 800 MG tablet Commonly known as: RENVELA  Take 1,600 mg by mouth 3 (three) times daily.   terazosin  2 MG capsule Commonly known as: HYTRIN  Take 2 mg  by mouth 2 (two) times daily.   Voltaren 1 % Gel Generic drug: diclofenac Sodium Apply 2 g topically 2 (two) times daily. Apply to left shoulder.        Contact information for follow-up providers     Fulton Job, Joseph Nickel, MD Follow up today.   Specialty: Family Medicine Contact information: [address]  [address] [place] [address] [phone]         [facility] Emergency Department at [facility] .   Specialty: Emergency Medicine Why: As needed, If symptoms worsen Contact information: [address] [place]  [address] [phone]             Contact information for after-discharge care     Destination     [facility] .   Service: Skilled Nursing Contact information: [address] [place]  [id number] [phone]                    Discharge Exam: Filed Weights   11/10/23 0930 11/11/23 0855 11/11/23 1210  Weight: 77.9 kg 74.8 kg 74.4  kg   General exam: Appears calm and comfortable  Respiratory system: Clear to auscultation. Respiratory effort normal. Cardiovascular system: S1 & S2 heard, RRR. No JVD, murmurs, rubs, gallops or clicks. No pedal edema. Gastrointestinal system: Abdomen is nondistended, soft and nontender. No organomegaly or masses felt. Normal bowel sounds heard. Central nervous system: Alert and oriented. No focal neurological deficits. Extremities: Symmetric 5 x 5 power. Skin: No rashes, lesions or ulcers Psychiatry: Judgement and insight appear normal. Mood & affect appropriate.    Condition at discharge: good  The results of significant diagnostics from this hospitalization (including imaging, microbiology, ancillary and laboratory) are listed below for reference.   Imaging Studies: MR BRAIN WO CONTRAST Result Date: 11/10/2023 CLINICAL DATA:  Initial evaluation for acute neuro deficit, stroke suspected. EXAM: MRI HEAD WITHOUT CONTRAST  TECHNIQUE: Multiplanar, multiecho pulse sequences of the brain and surrounding structures were obtained without intravenous contrast. COMPARISON:  CTs from earlier the same day. FINDINGS: Brain: Cerebral volume within normal limits. Patchy and confluent T2/FLAIR hyperintensity involving the periventricular and deep white matter both cerebral hemispheres as well as the pons, consistent with chronic small vessel ischemic disease, moderately advanced. Multiple remote lacunar infarcts present about the deep gray nuclei. No abnormal foci of restricted diffusion to suggest acute or subacute ischemia. Gray-white matter differentiation maintained. No areas of chronic cortical infarction. No acute intracranial hemorrhage. Multiple chronic micro hemorrhages noted about the cerebellum and thalami, most characteristic of chronic poorly controlled hypertension. No mass lesion, midline shift or mass effect. No hydrocephalus or extra-axial fluid collection. Pituitary gland within normal limits. Vascular: Major intracranial vascular flow voids are maintained. Skull and upper cervical spine: Craniocervical junction within normal limits. Diffuse loss of normal bone marrow signal, nonspecific but can be seen with anemia, smoking, obesity, and infiltrative/myelofibrotic marrow processes. No scalp soft tissue abnormality. Sinuses/Orbits: Globes orbital soft tissues within normal limits. Paranasal sinuses are largely clear. Large right mastoid effusion, of uncertain significance. Visualized nasopharynx unremarkable. Other: None. IMPRESSION: 1. No acute intracranial abnormality. 2. Moderately advanced chronic microvascular ischemic disease with multiple remote lacunar infarcts about the deep gray nuclei. 3. Multiple chronic micro hemorrhages about the cerebellum and thalami, most characteristic of chronic poorly controlled hypertension. 4. Large right mastoid effusion, of uncertain significance. Correlation with physical exam recommended.  Electronically Signed   By: Virgia Griffins M.D.   On: 11/10/2023 00:45   CT ANGIO HEAD NECK W WO CM W PERF (CODE STROKE) Result Date: 11/09/2023 CLINICAL DATA:  Initial evaluation for acute neuro deficit, stroke suspected. EXAM: CT ANGIOGRAPHY HEAD AND NECK CT PERFUSION BRAIN TECHNIQUE: Multidetector CT imaging of the head and neck was performed using the standard protocol during bolus administration of intravenous contrast. Multiplanar CT image reconstructions and MIPs were obtained to evaluate the vascular anatomy. Carotid stenosis measurements (when applicable) are obtained utilizing NASCET criteria, using the distal internal carotid diameter as the denominator. Multiphase CT imaging of the brain was performed following IV bolus contrast injection. Subsequent parametric perfusion maps were calculated using RAPID software. RADIATION DOSE REDUCTION: This exam was performed according to the departmental dose-optimization program which includes automated exposure control, adjustment of the mA and/or kV according to patient size and/or use of iterative reconstruction technique. CONTRAST:  100mL OMNIPAQUE IOHEXOL 350 MG/ML SOLN COMPARISON:  CT from earlier the same day. FINDINGS: CTA NECK FINDINGS Aortic arch: Visualized aortic arch within normal limits for caliber with standard 3 vessel morphology. Aortic atherosclerosis. No significant stenosis about the origin of the great vessels. Right  carotid system: Right common and internal carotid arteries are tortuous but patent without stenosis or dissection. Left carotid system: Left common and internal carotid arteries are tortuous but patent without stenosis or dissection. Vertebral arteries: Both vertebral arteries arise from the subclavian arteries. Left vertebral artery dominant. Vertebral arteries patent without stenosis or dissection. Skeleton: No worrisome osseous lesions. Moderate spondylosis at C5-6. Other neck: No other acute finding. Right IJ approach  central venous catheter in place. Upper chest: No other acute finding. Review of the MIP images confirms the above findings CTA HEAD FINDINGS Anterior circulation: Inch mild atheromatous change about the carotid siphons without stenosis. A1 segments patent bilaterally. Normal anterior communicating artery complex. Atheromatous irregularity about the ACAs without proximal high-grade stenosis. No M1 stenosis or occlusion. Distal MCA branches perfused and symmetric. Posterior circulation: Vertebrobasilar system with somewhat dolichoectatic. Both V4 segments patent without stenosis. Both PICA grossly patent at their origins. Basilar mildly irregular but patent without stenosis. Superior cerebellar and posterior cerebral arteries patent bilaterally. Venous sinuses: Patent allowing for timing the contrast bolus. Anatomic variants: None significant.  No aneurysm. Review of the MIP images confirms the above findings CT Brain Perfusion Findings: ASPECTS: 10 CBF (<30%) Volume: 0mL Perfusion (Tmax>6.0s) volume: 0mL Mismatch Volume: 0mL Infarction Location:Negative CT perfusion with no evidence for acute ischemia or other perfusion abnormality. IMPRESSION: 1. Negative CTA of the head and neck. No large vessel occlusion or other emergent finding. 2. Negative CT perfusion with no evidence for acute ischemia or other perfusion abnormality. 3. Diffuse tortuosity of the major arterial vasculature of the head and neck, suggesting chronic underlying hypertension. No hemodynamically significant or correctable stenosis. 4.  Aortic Atherosclerosis (ICD10-I70.0). Electronically Signed   By: Virgia Griffins M.D.   On: 11/09/2023 21:43   CT HEAD CODE STROKE WO CONTRAST Result Date: 11/09/2023 CLINICAL DATA:  Code stroke. Initial evaluation for acute neuro deficit, stroke suspected. EXAM: CT HEAD WITHOUT CONTRAST TECHNIQUE: Contiguous axial images were obtained from the base of the skull through the vertex without intravenous  contrast. RADIATION DOSE REDUCTION: This exam was performed according to the departmental dose-optimization program which includes automated exposure control, adjustment of the mA and/or kV according to patient size and/or use of iterative reconstruction technique. COMPARISON:  Prior study from 10/24/2023 FINDINGS: Brain: Mild age-related cerebral atrophy. Patchy hypodensity involving the supratentorial cerebral white matter, consistent chronic small vessel ischemic disease, moderately advanced in nature. Remote lacunar infarcts present about the thalami. No acute intracranial hemorrhage. No acute large vessel territory infarct. No mass lesion or midline shift. No hydrocephalus or extra-axial fluid collection. Vascular: No abnormal hyperdense vessel. Scattered vascular calcifications noted within the carotid siphons. Skull: Scalp soft tissues within normal limits.  Calvarium intact. Sinuses/Orbits: Globes orbital soft tissues within normal limits. Paranasal sinuses are clear. Large right mastoid effusion, similar to prior, and likely chronic. Left mastoid air cells are clear. Other: None. ASPECTS [facility] Stroke Program Early CT Score) - Ganglionic level infarction (caudate, lentiform nuclei, internal capsule, insula, M1-M3 cortex): 7 - Supraganglionic infarction (M4-M6 cortex): 3 Total score (0-10 with 10 being normal): 10 IMPRESSION: 1. No acute intracranial abnormality. 2. Aspects is 10. 3. Atrophy with moderately advanced chronic microvascular ischemic disease. 4. Large right mastoid effusion, similar to prior, likely chronic. Correlation with physical exam suggested. Results were called by telephone at the time of interpretation on 11/09/2023 at 7:26 pm to provider [facility] , who verbally acknowledged these results. Electronically Signed   By: Virgia Griffins M.D.   On:  11/09/2023 19:27   MR BRAIN WO CONTRAST Result Date: 10/24/2023 CLINICAL DATA:  Neuro deficit, acute, stroke suspected. EXAM: MRI HEAD  WITHOUT CONTRAST TECHNIQUE: Multiplanar, multiecho pulse sequences of the brain and surrounding structures were obtained without intravenous contrast. COMPARISON:  CT head from earlier today. FINDINGS: Brain: Remote lacunar infarcts in bilateral basal ganglia, thalami and pons. Additional remote infarct in the posterior right temporal/occipital region. Small remote cerebellar infarcts. Patchy T2/FLAIR hyperintensities in the white matter, compatible with chronic microvascular ischemic disease. No evidence of acute infarct, acute hemorrhage, mass lesion or midline shift. Many foci of susceptibility artifact in the thalami, basal ganglia, brainstem and cerebellum, compatible with chronic microhemorrhages. Vascular: Major arterial flow voids are maintained skull base. Skull and upper cervical spine: Normal marrow signal. Sinuses/Orbits: Clear sinuses.  No acute findings. Other: Right mastoid effusion. IMPRESSION: 1. No evidence of acute intracranial abnormality. 2. Remote infarcts and chronic microvascular ischemic disease. 3. Many chronic microhemorrhages, likely due to longstanding hypertension. 4. Right mastoid effusion. Electronically Signed   By: Stevenson Elbe M.D.   On: 10/24/2023 23:21   DG Chest 1 View Result Date: 10/24/2023 CLINICAL DATA:  Fatigue, headache EXAM: CHEST  1 VIEW COMPARISON:  08/09/2023 FINDINGS: Single frontal view of the chest demonstrates stable enlargement of the cardiac silhouette. Right internal jugular dialysis catheter unchanged. No acute airspace disease, effusion, or pneumothorax. No acute bony abnormalities. IMPRESSION: 1. Stable enlarged cardiac silhouette. 2. No acute airspace disease. Electronically Signed   By: Bobbye Burrow M.D.   On: 10/24/2023 22:41   CT HEAD WO CONTRAST Result Date: 10/24/2023 CLINICAL DATA:  Headache, photophobia, blurred vision EXAM: CT HEAD WITHOUT CONTRAST TECHNIQUE: Contiguous axial images were obtained from the base of the skull through the  vertex without intravenous contrast. RADIATION DOSE REDUCTION: This exam was performed according to the departmental dose-optimization program which includes automated exposure control, adjustment of the mA and/or kV according to patient size and/or use of iterative reconstruction technique. COMPARISON:  09/11/2023 FINDINGS: Brain: Stable chronic small-vessel ischemic changes within the basal ganglia, thalami, and periventricular white matter. No evidence of acute infarct or hemorrhage. Lateral ventricles and remaining midline structures are unremarkable. No acute extra-axial fluid collections. No mass effect. Vascular: No hyperdense vessel or unexpected calcification. Skull: Normal. Negative for fracture or focal lesion. Sinuses/Orbits: Stable right mastoid effusion. Remaining paranasal sinuses are clear. Other: None. IMPRESSION: 1. Stable head CT, no acute intracranial process. 2. Stable right mastoid effusion. Electronically Signed   By: Bobbye Burrow M.D.   On: 10/24/2023 22:05    Microbiology: Results for orders placed or performed during the hospital encounter of 11/09/23  MRSA Next Gen by PCR, Nasal     Status: Abnormal   Collection Time: 11/10/23  9:25 AM   Specimen: Nasal Mucosa; Nasal Swab  Result Value Ref Range Status   MRSA by PCR Next Gen DETECTED (A) NOT DETECTED Final    Comment: RESULT CALLED TO, READ BACK BY AND VERIFIED WITH:  MYRA FLOWERS RN 11/10/23 @1134  KKG (NOTE) The GeneXpert MRSA Assay (FDA approved for NASAL specimens only), is one component of a comprehensive MRSA colonization surveillance program. It is not intended to diagnose MRSA infection nor to guide or monitor treatment for MRSA infections. Test performance is not FDA approved in patients less than 38 years old. Performed at [facility], [address] [place], [place] [address]     Labs: CBC: Recent Labs  Lab 11/09/23 2001 11/11/23 0753  WBC 6.6 5.7  NEUTROABS  4.3  --   HGB 8.3* 8.1*   HCT 25.1* 24.1*  MCV 81.2 80.1  PLT 158 134*   Basic Metabolic Panel: Recent Labs  Lab 11/09/23 2001 11/11/23 0753  NA 141 139  K 3.7 4.1  CL 101 102  CO2 27 26  GLUCOSE 106* 102*  BUN 70* 79*  CREATININE 5.31* 6.69*  CALCIUM  9.2 8.8*  PHOS  --  5.1*   Liver Function Tests: Recent Labs  Lab 11/09/23 2001 11/11/23 0753  AST 25  --   ALT 16  --   ALKPHOS 286*  --   BILITOT 0.6  --   PROT 7.9  --   ALBUMIN 4.0 3.4*   CBG: Recent Labs  Lab 11/09/23 1910 11/10/23 0909  GLUCAP 102* 149*    Discharge time spent: greater than 30 minutes.  Signed: Donaciano Frizzle, MD [facility] 11/11/2023

## 2023-11-14 ENCOUNTER — Other Ambulatory Visit: Payer: Self-pay

## 2023-11-14 ENCOUNTER — Emergency Department
Admission: EM | Admit: 2023-11-14 | Discharge: 2023-11-15 | Disposition: A | Discharge status: Home / Self Care | Attending: Emergency Medicine | Admitting: Emergency Medicine

## 2023-11-14 DIAGNOSIS — R200 Anesthesia of skin: Secondary | ICD-10-CM | POA: Insufficient documentation

## 2023-11-14 DIAGNOSIS — Z992 Dependence on renal dialysis: Secondary | ICD-10-CM | POA: Diagnosis not present

## 2023-11-14 DIAGNOSIS — I509 Heart failure, unspecified: Secondary | ICD-10-CM | POA: Insufficient documentation

## 2023-11-14 DIAGNOSIS — I132 Hypertensive heart and chronic kidney disease with heart failure and with stage 5 chronic kidney disease, or end stage renal disease: Secondary | ICD-10-CM | POA: Insufficient documentation

## 2023-11-14 DIAGNOSIS — I10 Essential (primary) hypertension: Secondary | ICD-10-CM

## 2023-11-14 DIAGNOSIS — R202 Paresthesia of skin: Secondary | ICD-10-CM | POA: Insufficient documentation

## 2023-11-14 DIAGNOSIS — N186 End stage renal disease: Secondary | ICD-10-CM | POA: Insufficient documentation

## 2023-11-14 LAB — COMPREHENSIVE METABOLIC PANEL WITH GFR
ALT: 16 U/L (ref 0–44)
AST: 25 U/L (ref 15–41)
Albumin: 4.1 g/dL (ref 3.5–5.0)
Alkaline Phosphatase: 308 U/L — ABNORMAL HIGH (ref 38–126)
Anion gap: 14 (ref 5–15)
BUN: 66 mg/dL — ABNORMAL HIGH (ref 6–20)
CO2: 24 mmol/L (ref 22–32)
Calcium: 8.9 mg/dL (ref 8.9–10.3)
Chloride: 102 mmol/L (ref 98–111)
Creatinine, Ser: 5.88 mg/dL — ABNORMAL HIGH (ref 0.44–1.00)
GFR, Estimated: 8 mL/min — ABNORMAL LOW (ref >60)
Glucose, Bld: 94 mg/dL (ref 70–99)
Potassium: 3.9 mmol/L (ref 3.5–5.1)
Sodium: 140 mmol/L (ref 135–145)
Total Bilirubin: 0.7 mg/dL (ref 0.0–1.2)
Total Protein: 8.3 g/dL — ABNORMAL HIGH (ref 6.5–8.1)

## 2023-11-14 LAB — CBC
HCT: 28.7 % — ABNORMAL LOW (ref 36.0–46.0)
Hemoglobin: 9.1 g/dL — ABNORMAL LOW (ref 12.0–15.0)
MCH: 26.4 pg (ref 26.0–34.0)
MCHC: 31.7 g/dL (ref 30.0–36.0)
MCV: 83.2 fL (ref 80.0–100.0)
Platelets: 187 10*3/uL (ref 150–400)
RBC: 3.45 MIL/uL — ABNORMAL LOW (ref 3.87–5.11)
RDW: 17.7 % — ABNORMAL HIGH (ref 11.5–15.5)
WBC: 7.3 10*3/uL (ref 4.0–10.5)
nRBC: 0 % (ref 0.0–0.2)

## 2023-11-14 MED ORDER — FUROSEMIDE 80 MG PO TABS: 80.0000 mg | ORAL_TABLET | Freq: Every day | ORAL | 0 refills | Status: DC | PRN

## 2023-11-14 NOTE — ED Notes (Signed)
 Report called to Nursing home.

## 2023-11-14 NOTE — ED Provider Notes (Signed)
 [facility] Provider Note   Event Date/Time   First MD Initiated Contact with Patient 11/14/23 2257     (approximate) History  Hypertension  HPI Latanja Lehenbauer is a 53 y.o. female with a past medical history of CHF, hypertension, and end-stage renal disease on dialysis who presents complaining of of an episode of hypertension that occurred earlier today with associated symptoms of tingling to the left side of the face as well as numbness to the left upper and lower extremity.  Patient states that the upper and lower extremity numbness are deficits from a previous stroke.  Patient states the tingling on the left side of her face has resolved as has her hypertension with a reading of 142/87 on her arrival ROS: Patient currently denies any vision changes, tinnitus, difficulty speaking, facial droop, sore throat, chest pain, shortness of breath, abdominal pain, nausea/vomiting/diarrhea, dysuria, or weakness/paresthesias in any extremity   Physical Exam  Triage Vital Signs: ED Triage Vitals  Encounter Vitals Group     BP 11/14/23 2035 (!) 153/91     Girls Systolic BP Percentile --      Girls Diastolic BP Percentile --      Boys Systolic BP Percentile --      Boys Diastolic BP Percentile --      Pulse Rate 11/14/23 2035 81     Resp --      Temp 11/14/23 2035 97.9 F (36.6 C)     Temp Source 11/14/23 2035 Oral     SpO2 11/14/23 2035 100 %     Weight 11/14/23 2036 170 lb (77.1 kg)     Height 11/14/23 2036 5' 3 (1.6 m)     Head Circumference --      Peak Flow --      Pain Score 11/14/23 2036 0     Pain Loc --      Pain Education --      Exclude from Growth Chart --    Most recent vital signs: Vitals:   11/14/23 2035 11/14/23 2230  BP: (!) 153/91 (!) 147/92  Pulse: 81 78  Temp: 97.9 F (36.6 C)   SpO2: 100% 100%   General: Awake, oriented x4. CV:  Good peripheral perfusion. Resp:  Normal effort. Abd:  No distention. Other:  Middle-aged obese  African-American female resting comfortably in no acute distress ED Results / Procedures / Treatments  Labs (all labs ordered are listed, but only abnormal results are displayed) Labs Reviewed  CBC - Abnormal; Notable for the following components:      Result Value   RBC 3.45 (*)    Hemoglobin 9.1 (*)    HCT 28.7 (*)    RDW 17.7 (*)    All other components within normal limits  COMPREHENSIVE METABOLIC PANEL WITH GFR - Abnormal; Notable for the following components:   BUN 66 (*)    Creatinine, Ser 5.88 (*)    Total Protein 8.3 (*)    Alkaline Phosphatase 308 (*)    GFR, Estimated 8 (*)    All other components within normal limits   PROCEDURES: Critical Care performed: No Procedures MEDICATIONS ORDERED IN ED: Medications - No data to display IMPRESSION / MDM / ASSESSMENT AND PLAN / ED COURSE  I reviewed the triage vital signs and the nursing notes.                             The patient is  on the cardiac monitor to evaluate for evidence of arrhythmia and/or significant heart rate changes. Patient's presentation is most consistent with acute presentation with potential threat to life or bodily function. Presents to the emergency department complaining of high blood pressure. Patient is otherwise asymptomatic without confusion, chest pain, hematuria, or SOB. Denies nonadherence to antihypertensive regimen DDx: CV, AMI, heart failure, renal infarction or failure or other end organ damage.  Disposition: Discussed with patient their elevated blood pressure and need for close outpatient management of their hypertension. Will provide a prescription for the patient's previous antihypertensive medication and arrange for the patient to follow up in a primary care clinic    FINAL CLINICAL IMPRESSION(S) / ED DIAGNOSES   Final diagnoses:  Uncontrolled hypertension   Rx / DC Orders   ED Discharge Orders          Ordered    furosemide (LASIX) 80 MG tablet  Daily PRN        11/14/23  2312           Note:  This document was prepared using Dragon voice recognition software and may include unintentional dictation errors.   Saydie Gerdts K, MD 11/14/23 [phone]

## 2023-11-14 NOTE — ED Triage Notes (Signed)
 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

## 2023-11-15 NOTE — ED Notes (Signed)
 Called [facility] arranged transport back to [facility]

## 2023-11-26 ENCOUNTER — Emergency Department

## 2023-11-26 ENCOUNTER — Other Ambulatory Visit: Payer: Self-pay

## 2023-11-26 ENCOUNTER — Emergency Department
Admission: EM | Admit: 2023-11-26 | Discharge: 2023-11-27 | Disposition: A | Discharge status: Home / Self Care | Attending: Emergency Medicine | Admitting: Emergency Medicine

## 2023-11-26 DIAGNOSIS — R079 Chest pain, unspecified: Secondary | ICD-10-CM | POA: Insufficient documentation

## 2023-11-26 DIAGNOSIS — R7989 Other specified abnormal findings of blood chemistry: Secondary | ICD-10-CM | POA: Diagnosis not present

## 2023-11-26 DIAGNOSIS — Z992 Dependence on renal dialysis: Secondary | ICD-10-CM | POA: Insufficient documentation

## 2023-11-26 DIAGNOSIS — R0602 Shortness of breath: Secondary | ICD-10-CM | POA: Diagnosis not present

## 2023-11-26 DIAGNOSIS — I509 Heart failure, unspecified: Secondary | ICD-10-CM | POA: Diagnosis not present

## 2023-11-26 DIAGNOSIS — I132 Hypertensive heart and chronic kidney disease with heart failure and with stage 5 chronic kidney disease, or end stage renal disease: Secondary | ICD-10-CM | POA: Insufficient documentation

## 2023-11-26 DIAGNOSIS — N186 End stage renal disease: Secondary | ICD-10-CM | POA: Diagnosis not present

## 2023-11-26 NOTE — ED Triage Notes (Signed)
 Pt presented to ED BIBA from [facility] with c/o chest pain starting 45 mins PTA starting after eating. States chest pain was initially a tightness in the center of her chest but states it feels like a dull pain and has eased up at this time. Hx HTN, dialysis (M,T,W, F). 324mg  ASA and 1in nitroglycerin given by EMS in route.

## 2023-11-27 ENCOUNTER — Emergency Department

## 2023-11-27 DIAGNOSIS — R079 Chest pain, unspecified: Secondary | ICD-10-CM | POA: Diagnosis not present

## 2023-11-27 LAB — BASIC METABOLIC PANEL WITH GFR
Anion gap: 14 (ref 5–15)
BUN: 66 mg/dL — ABNORMAL HIGH (ref 6–20)
CO2: 26 mmol/L (ref 22–32)
Calcium: 8.8 mg/dL — ABNORMAL LOW (ref 8.9–10.3)
Chloride: 101 mmol/L (ref 98–111)
Creatinine, Ser: 5.94 mg/dL — ABNORMAL HIGH (ref 0.44–1.00)
GFR, Estimated: 8 mL/min — ABNORMAL LOW (ref >60)
Glucose, Bld: 96 mg/dL (ref 70–99)
Potassium: 4 mmol/L (ref 3.5–5.1)
Sodium: 141 mmol/L (ref 135–145)

## 2023-11-27 LAB — CBC
HCT: 25 % — ABNORMAL LOW (ref 36.0–46.0)
Hemoglobin: 7.9 g/dL — ABNORMAL LOW (ref 12.0–15.0)
MCH: 26.5 pg (ref 26.0–34.0)
MCHC: 31.6 g/dL (ref 30.0–36.0)
MCV: 83.9 fL (ref 80.0–100.0)
Platelets: 158 10*3/uL (ref 150–400)
RBC: 2.98 MIL/uL — ABNORMAL LOW (ref 3.87–5.11)
RDW: 17.3 % — ABNORMAL HIGH (ref 11.5–15.5)
WBC: 6.3 10*3/uL (ref 4.0–10.5)
nRBC: 0 % (ref 0.0–0.2)

## 2023-11-27 LAB — BRAIN NATRIURETIC PEPTIDE: B Natriuretic Peptide: 463.1 pg/mL — ABNORMAL HIGH (ref 0.0–100.0)

## 2023-11-27 LAB — TROPONIN I (HIGH SENSITIVITY)
Troponin I (High Sensitivity): 37 ng/L — ABNORMAL HIGH (ref <18)
Troponin I (High Sensitivity): 40 ng/L — ABNORMAL HIGH (ref <18)

## 2023-11-27 LAB — D-DIMER, QUANTITATIVE: D-Dimer, Quant: 0.72 ug{FEU}/mL — ABNORMAL HIGH (ref 0.00–0.50)

## 2023-11-27 MED ORDER — IOHEXOL 350 MG/ML SOLN
100.0000 mL | Freq: Once | INTRAVENOUS | Status: AC | PRN
  Administered 2023-11-27: 100 mL via INTRAVENOUS

## 2023-11-27 NOTE — ED Notes (Addendum)
Charge RN at bedside to attempt IV access;  

## 2023-11-27 NOTE — ED Notes (Signed)
 Fall precautions in place for Pt. This RN placed fall band, fall grip socks, bed alarm and fall sign.

## 2023-11-27 NOTE — ED Provider Notes (Incomplete)
 [facility] Provider Note    Event Date/Time   First MD Initiated Contact with Patient 11/26/23 2329     (approximate)   History   Chest Pain   HPI  Allison Hill is a 53 y.o. female   Past medical history of ***    Independent Historian contributed to assessment above: ***  External Medical Documents Reviewed: ***      Physical Exam   Triage Vital Signs: ED Triage Vitals  Encounter Vitals Group     BP 11/26/23 2323 (!) 170/109     Girls Systolic BP Percentile --      Girls Diastolic BP Percentile --      Boys Systolic BP Percentile --      Boys Diastolic BP Percentile --      Pulse Rate 11/26/23 2323 86     Resp 11/26/23 2323 16     Temp 11/26/23 2323 98 F (36.7 C)     Temp Source 11/26/23 2323 Oral     SpO2 11/26/23 2322 99 %     Weight 11/26/23 2324 169 lb 15.6 oz (77.1 kg)     Height 11/26/23 2324 5' 3 (1.6 m)     Head Circumference --      Peak Flow --      Pain Score --      Pain Loc --      Pain Education --      Exclude from Growth Chart --     Most recent vital signs: Vitals:   11/26/23 2322 11/26/23 2323  BP:  (!) 170/109  Pulse:  86  Resp:  16  Temp:  98 F (36.7 C)  SpO2: 99% 99%    General: Awake, no distress. *** CV:  Good peripheral perfusion. *** Resp:  Normal effort. *** Abd:  No distention. *** Other:  ***   ED Results / Procedures / Treatments   Labs (all labs ordered are listed, but only abnormal results are displayed) Labs Reviewed  BASIC METABOLIC PANEL WITH GFR  CBC  TROPONIN I (HIGH SENSITIVITY)     I ordered and reviewed the above labs they are notable for ***  EKG  ED ECG REPORT I, Ginnie Shams, the attending physician, personally viewed and interpreted this ECG.   Date: 11/27/2023  EKG Time: ***  Rate: ***  Rhythm: {ekg findings:315101}  Axis: ***  Intervals:{conduction defects:17367}  ST&T Change: ***    RADIOLOGY I independently reviewed and interpreted *** I  also reviewed radiologist's formal read.   PROCEDURES:  Critical Care performed: {CriticalCareYesNo:19197::Yes, see critical care procedure note(s),No}  Procedures   MEDICATIONS ORDERED IN ED: Medications - No data to display  External physician / consultants:  I spoke with *** regarding care plan for this patient.   IMPRESSION / MDM / ASSESSMENT AND PLAN / ED COURSE  I reviewed the triage vital signs and the nursing notes.                                Patient's presentation is most consistent with {EM COPA:27473}  Differential diagnosis includes, but is not limited to, ***   ***The patient is on the cardiac monitor to evaluate for evidence of arrhythmia and/or significant heart rate changes.  MDM:  ***  I considered hospitalization for admission or observation ***        FINAL CLINICAL IMPRESSION(S) / ED DIAGNOSES   Final diagnoses:  None     Rx / DC Orders   ED Discharge Orders     None        Note:  This document was prepared using Dragon voice recognition software and may include unintentional dictation errors.

## 2023-11-27 NOTE — Discharge Instructions (Addendum)
 Fortunately your evaluation the emergency department did not show any emergent conditions that accounted for your symptoms tonight.  It does not appear that you are having a heart attack or a blood clot in your lung.  Your hemoglobin blood levels were slightly low (anemia), as they normally are, so please have your doctor recheck these levels within the next week.  Thank you for choosing us  for your health care today!  Please see your primary doctor this week for a follow up appointment.   If you have any new, worsening, or unexpected symptoms call your doctor right away or come back to the emergency department for reevaluation.  It was my pleasure to care for you today.   Ginnie EDISON Cyrena, MD

## 2023-11-27 NOTE — ED Provider Notes (Addendum)
 [facility] Provider Note    Event Date/Time   First MD Initiated Contact with Patient 11/26/23 2329     (approximate)   History   Chest Pain   HPI  Allison Hill is a 53 y.o. female   Past medical history of CHF, hypertension, ESRD on dialysis, iron deficiency anemia, substance use, who presents to the emergency department with chest pain starting just prior to arrival after eating.  Felt like a tightness in the middle of her chest, dull, associated with some shortness of breath as well.  Has eased up quite a bit after getting nitroglycerin and aspirin  by EMS, but still remains a dull ache in the middle of her chest with some shortness of breath.  She denies any recent respiratory infectious symptoms.  She denies radiation of pain.  She denies leg swelling, or pain.  She has been compliant with her dialysis and has not missed any sessions recently.   External Medical Documents Reviewed: Hospital notes from recent hospitalization earlier this month for hypertensive encephalopathy, migraine headache, remote infarcts and chronic microhemorrhages shown on MRI of the brain suspected related to chronic Foley uncontrolled hypertension      Physical Exam   Triage Vital Signs: ED Triage Vitals  Encounter Vitals Group     BP 11/26/23 2323 (!) 170/109     Girls Systolic BP Percentile --      Girls Diastolic BP Percentile --      Boys Systolic BP Percentile --      Boys Diastolic BP Percentile --      Pulse Rate 11/26/23 2323 86     Resp 11/26/23 2323 16     Temp 11/26/23 2323 98 F (36.7 C)     Temp Source 11/26/23 2323 Oral     SpO2 11/26/23 2322 99 %     Weight 11/26/23 2324 169 lb 15.6 oz (77.1 kg)     Height 11/26/23 2324 5' 3 (1.6 m)     Head Circumference --      Peak Flow --      Pain Score --      Pain Loc --      Pain Education --      Exclude from Growth Chart --     Most recent vital signs: Vitals:   11/27/23 0326 11/27/23 0328   BP: (!) 141/84   Pulse: 77   Resp: 16   Temp:  98 F (36.7 C)  SpO2: 100%     General: Awake, no distress.  CV:  Good peripheral perfusion.  Resp:  Normal effort.  Abd:  No distention.  Other:  Resting comfortably with normal vital signs no respiratory distress no hypoxemia on room air.  Afebrile.  Lung sounds clear to auscultation without focality or wheezing or rales.  Soft benign abdominal exam deep palpation all quadrants.   ED Results / Procedures / Treatments   Labs (all labs ordered are listed, but only abnormal results are displayed) Labs Reviewed  BASIC METABOLIC PANEL WITH GFR - Abnormal; Notable for the following components:      Result Value   BUN 66 (*)    Creatinine, Ser 5.94 (*)    Calcium  8.8 (*)    GFR, Estimated 8 (*)    All other components within normal limits  CBC - Abnormal; Notable for the following components:   RBC 2.98 (*)    Hemoglobin 7.9 (*)    HCT 25.0 (*)    RDW 17.3 (*)  All other components within normal limits  BRAIN NATRIURETIC PEPTIDE - Abnormal; Notable for the following components:   B Natriuretic Peptide 463.1 (*)    All other components within normal limits  D-DIMER, QUANTITATIVE - Abnormal; Notable for the following components:   D-Dimer, Quant 0.72 (*)    All other components within normal limits  TROPONIN I (HIGH SENSITIVITY) - Abnormal; Notable for the following components:   Troponin I (High Sensitivity) 37 (*)    All other components within normal limits  TROPONIN I (HIGH SENSITIVITY) - Abnormal; Notable for the following components:   Troponin I (High Sensitivity) 40 (*)    All other components within normal limits     I ordered and reviewed the above labs they are notable for hemoglobin 7.9 around where her baseline is from hospitalization records recently, creatinine elevated, D-dimer 0.72 and stable troponins at 37/40.  EKG  ED ECG REPORT I, Ginnie Shams, the attending physician, personally viewed and interpreted  this ECG.   Date: 11/27/2023  EKG Time: 2329  Rate: 85  Rhythm: sinus  Axis: nl  Intervals:prolonged pr  ST&T Change: no stemi    RADIOLOGY I independently reviewed and interpreted chest x-ray and I see no obvious focal consolidations or pneumothorax I also reviewed radiologist's formal read.   PROCEDURES:  Critical Care performed: No  Procedures   MEDICATIONS ORDERED IN ED: Medications  iohexol  (OMNIPAQUE ) 350 MG/ML injection 100 mL (100 mLs Intravenous Contrast Given 11/27/23 0230)     IMPRESSION / MDM / ASSESSMENT AND PLAN / ED COURSE  I reviewed the triage vital signs and the nursing notes.                                Patient's presentation is most consistent with acute presentation with potential threat to life or bodily function.  Differential diagnosis includes, but is not limited to, ACS, PE, musculoskeletal pain, respiratory infection, considered but unlikely dissection   The patient is on the cardiac monitor to evaluate for evidence of arrhythmia and/or significant heart rate changes.  MDM:    Nonspecific chest pain and some shortness of breath acute onset tonight in this otherwise very well-appearing patient with stable vital signs.  Thus far workup unremarkable including serial troponin, basic blood work, and she is resting comfortably and mostly asymptomatic now.  Her D-dimer was slightly elevated we will proceed with CT angiogram to rule out PE.  If this is negative, I doubt cardiopulmonary emergency in light of unremarkable evaluation and mostly subsided symptoms at this time.  Sure BNP is slightly elevated but her chest x-ray is clear, she has no rales on auscultation, no hypoxemia, and does not look fluid overloaded so I doubt this is an exacerbation of CHF or pulmonary edema.  CT angiogram does not show any emergency findings, plan will be for discharge with PMD follow-up.   --- I considered transfusion for hemoglobin of 7.9 but given that this  is not far from her baseline with hospitalization records with an 8.1 during that time, and no reports of bleeding, and transient chest pain/shortness of breath I do not think a result of anemia, I will defer on transfusion at this time and instead have her follow-up with PMD for recheck      FINAL CLINICAL IMPRESSION(S) / ED DIAGNOSES   Final diagnoses:  Nonspecific chest pain     Rx / DC Orders   ED Discharge Orders  None        Note:  This document was prepared using Dragon voice recognition software and may include unintentional dictation errors.    Cyrena Mylar, MD 11/27/23 9673    Cyrena Mylar, MD 11/27/23 [phone]

## 2023-11-27 NOTE — ED Notes (Signed)
 X-ray at bedside

## 2023-11-27 NOTE — ED Notes (Signed)
 Cyrena MD made aware unable to get blood on this pt after multiple nurse attempts. IV access consult placed.

## 2023-12-06 ENCOUNTER — Observation Stay
Admission: EM | Admit: 2023-12-06 | Discharge: 2023-12-09 | Disposition: A | Discharge status: Other Acute Inpatient Hospital | Attending: Family Medicine | Admitting: Family Medicine

## 2023-12-06 ENCOUNTER — Emergency Department

## 2023-12-06 DIAGNOSIS — I160 Hypertensive urgency: Secondary | ICD-10-CM | POA: Diagnosis not present

## 2023-12-06 DIAGNOSIS — D508 Other iron deficiency anemias: Secondary | ICD-10-CM | POA: Diagnosis not present

## 2023-12-06 DIAGNOSIS — I132 Hypertensive heart and chronic kidney disease with heart failure and with stage 5 chronic kidney disease, or end stage renal disease: Secondary | ICD-10-CM | POA: Insufficient documentation

## 2023-12-06 DIAGNOSIS — R0602 Shortness of breath: Secondary | ICD-10-CM | POA: Insufficient documentation

## 2023-12-06 DIAGNOSIS — Z992 Dependence on renal dialysis: Secondary | ICD-10-CM | POA: Insufficient documentation

## 2023-12-06 DIAGNOSIS — F1421 Cocaine dependence, in remission: Secondary | ICD-10-CM | POA: Diagnosis not present

## 2023-12-06 DIAGNOSIS — D573 Sickle-cell trait: Secondary | ICD-10-CM | POA: Diagnosis not present

## 2023-12-06 DIAGNOSIS — F17210 Nicotine dependence, cigarettes, uncomplicated: Secondary | ICD-10-CM | POA: Insufficient documentation

## 2023-12-06 DIAGNOSIS — Z79899 Other long term (current) drug therapy: Secondary | ICD-10-CM | POA: Insufficient documentation

## 2023-12-06 DIAGNOSIS — I161 Hypertensive emergency: Secondary | ICD-10-CM | POA: Diagnosis not present

## 2023-12-06 DIAGNOSIS — I5032 Chronic diastolic (congestive) heart failure: Secondary | ICD-10-CM | POA: Diagnosis present

## 2023-12-06 DIAGNOSIS — J811 Chronic pulmonary edema: Secondary | ICD-10-CM | POA: Diagnosis present

## 2023-12-06 DIAGNOSIS — E211 Secondary hyperparathyroidism, not elsewhere classified: Secondary | ICD-10-CM | POA: Diagnosis not present

## 2023-12-06 DIAGNOSIS — J810 Acute pulmonary edema: Secondary | ICD-10-CM | POA: Insufficient documentation

## 2023-12-06 DIAGNOSIS — N186 End stage renal disease: Secondary | ICD-10-CM | POA: Diagnosis not present

## 2023-12-06 DIAGNOSIS — R0789 Other chest pain: Secondary | ICD-10-CM | POA: Diagnosis present

## 2023-12-06 DIAGNOSIS — D631 Anemia in chronic kidney disease: Secondary | ICD-10-CM | POA: Insufficient documentation

## 2023-12-06 DIAGNOSIS — I509 Heart failure, unspecified: Secondary | ICD-10-CM | POA: Diagnosis not present

## 2023-12-06 DIAGNOSIS — I10 Essential (primary) hypertension: Secondary | ICD-10-CM | POA: Diagnosis present

## 2023-12-06 DIAGNOSIS — F1490 Cocaine use, unspecified, uncomplicated: Secondary | ICD-10-CM | POA: Diagnosis present

## 2023-12-06 DIAGNOSIS — Z7982 Long term (current) use of aspirin: Secondary | ICD-10-CM | POA: Diagnosis not present

## 2023-12-06 LAB — CBC WITH DIFFERENTIAL/PLATELET
Abs Immature Granulocytes: 0.03 K/uL (ref 0.00–0.07)
Basophils Absolute: 0 K/uL (ref 0.0–0.1)
Basophils Relative: 0 %
Eosinophils Absolute: 0.2 K/uL (ref 0.0–0.5)
Eosinophils Relative: 3 %
HCT: 24.9 % — ABNORMAL LOW (ref 36.0–46.0)
Hemoglobin: 7.9 g/dL — ABNORMAL LOW (ref 12.0–15.0)
Immature Granulocytes: 0 %
Lymphocytes Relative: 20 %
Lymphs Abs: 1.3 K/uL (ref 0.7–4.0)
MCH: 26.9 pg (ref 26.0–34.0)
MCHC: 31.7 g/dL (ref 30.0–36.0)
MCV: 84.7 fL (ref 80.0–100.0)
Monocytes Absolute: 0.7 K/uL (ref 0.1–1.0)
Monocytes Relative: 10 %
Neutro Abs: 4.5 K/uL (ref 1.7–7.7)
Neutrophils Relative %: 67 %
Platelets: 173 K/uL (ref 150–400)
RBC: 2.94 MIL/uL — ABNORMAL LOW (ref 3.87–5.11)
RDW: 19.8 % — ABNORMAL HIGH (ref 11.5–15.5)
WBC: 6.8 K/uL (ref 4.0–10.5)
nRBC: 0 % (ref 0.0–0.2)

## 2023-12-06 LAB — RESP PANEL BY RT-PCR (RSV, FLU A&B, COVID)  RVPGX2
Influenza A by PCR: NEGATIVE
Influenza B by PCR: NEGATIVE
Resp Syncytial Virus by PCR: NEGATIVE
SARS Coronavirus 2 by RT PCR: NEGATIVE

## 2023-12-06 LAB — COMPREHENSIVE METABOLIC PANEL WITH GFR
ALT: 8 U/L (ref 0–44)
AST: 16 U/L (ref 15–41)
Albumin: 3.6 g/dL (ref 3.5–5.0)
Alkaline Phosphatase: 350 U/L — ABNORMAL HIGH (ref 38–126)
Anion gap: 13 (ref 5–15)
BUN: 72 mg/dL — ABNORMAL HIGH (ref 6–20)
CO2: 26 mmol/L (ref 22–32)
Calcium: 8.8 mg/dL — ABNORMAL LOW (ref 8.9–10.3)
Chloride: 101 mmol/L (ref 98–111)
Creatinine, Ser: 6.88 mg/dL — ABNORMAL HIGH (ref 0.44–1.00)
GFR, Estimated: 7 mL/min — ABNORMAL LOW (ref >60)
Glucose, Bld: 98 mg/dL (ref 70–99)
Potassium: 4.1 mmol/L (ref 3.5–5.1)
Sodium: 140 mmol/L (ref 135–145)
Total Bilirubin: 0.7 mg/dL (ref 0.0–1.2)
Total Protein: 7.1 g/dL (ref 6.5–8.1)

## 2023-12-06 LAB — BRAIN NATRIURETIC PEPTIDE: B Natriuretic Peptide: 2085.3 pg/mL — ABNORMAL HIGH (ref 0.0–100.0)

## 2023-12-06 LAB — TROPONIN I (HIGH SENSITIVITY)
Troponin I (High Sensitivity): 36 ng/L — ABNORMAL HIGH (ref <18)
Troponin I (High Sensitivity): 34 ng/L — ABNORMAL HIGH (ref <18)

## 2023-12-06 MED ORDER — TERAZOSIN HCL 2 MG PO CAPS
2.0000 mg | ORAL_CAPSULE | Freq: Two times a day (BID) | ORAL | Status: DC
  Administered 2023-12-06 – 2023-12-09 (×7): 2 mg via ORAL
  Filled 2023-12-06 (×9): qty 1

## 2023-12-06 MED ORDER — POLYETHYLENE GLYCOL 3350 17 G PO PACK
17.0000 g | PACK | Freq: Every day | ORAL | Status: DC | PRN
  Administered 2023-12-07: 17 g via ORAL
  Filled 2023-12-06: qty 1

## 2023-12-06 MED ORDER — ACETAMINOPHEN 500 MG PO TABS
500.0000 mg | ORAL_TABLET | Freq: Three times a day (TID) | ORAL | Status: DC
  Administered 2023-12-06 – 2023-12-09 (×10): 500 mg via ORAL
  Filled 2023-12-06 (×10): qty 1

## 2023-12-06 MED ORDER — GABAPENTIN 100 MG PO CAPS
200.0000 mg | ORAL_CAPSULE | Freq: Three times a day (TID) | ORAL | Status: DC
  Administered 2023-12-06 – 2023-12-09 (×10): 200 mg via ORAL
  Filled 2023-12-06 (×10): qty 2

## 2023-12-06 MED ORDER — AMLODIPINE BESYLATE 10 MG PO TABS
10.0000 mg | ORAL_TABLET | Freq: Every day | ORAL | Status: DC
  Administered 2023-12-07 – 2023-12-09 (×3): 10 mg via ORAL
  Filled 2023-12-06 (×3): qty 1

## 2023-12-06 MED ORDER — HEPARIN SODIUM (PORCINE) 5000 UNIT/ML IJ SOLN
5000.0000 [IU] | Freq: Three times a day (TID) | INTRAMUSCULAR | Status: DC
  Administered 2023-12-06 – 2023-12-09 (×6): 5000 [IU] via SUBCUTANEOUS
  Filled 2023-12-06 (×8): qty 1

## 2023-12-06 MED ORDER — CHLORHEXIDINE GLUCONATE CLOTH 2 % EX PADS
6.0000 | MEDICATED_PAD | Freq: Every day | CUTANEOUS | Status: DC
  Administered 2023-12-07 – 2023-12-09 (×3): 6 via TOPICAL

## 2023-12-06 MED ORDER — PANTOPRAZOLE SODIUM 40 MG PO TBEC
40.0000 mg | DELAYED_RELEASE_TABLET | Freq: Every day | ORAL | Status: DC
  Administered 2023-12-07 – 2023-12-09 (×3): 40 mg via ORAL
  Filled 2023-12-06 (×3): qty 1

## 2023-12-06 MED ORDER — CALCIUM CARBONATE ANTACID 500 MG PO CHEW
400.0000 mg | CHEWABLE_TABLET | Freq: Two times a day (BID) | ORAL | Status: DC
  Administered 2023-12-06 – 2023-12-09 (×7): 400 mg via ORAL
  Filled 2023-12-06 (×7): qty 2

## 2023-12-06 MED ORDER — METOPROLOL TARTRATE 50 MG PO TABS
50.0000 mg | ORAL_TABLET | Freq: Once | ORAL | Status: AC
  Administered 2023-12-06: 50 mg via ORAL
  Filled 2023-12-06: qty 1

## 2023-12-06 MED ORDER — CLONIDINE HCL 0.1 MG PO TABS
0.1000 mg | ORAL_TABLET | Freq: Once | ORAL | Status: AC
  Administered 2023-12-06: 0.1 mg via ORAL
  Filled 2023-12-06: qty 1

## 2023-12-06 MED ORDER — CINACALCET HCL 30 MG PO TABS
60.0000 mg | ORAL_TABLET | Freq: Every day | ORAL | Status: DC
  Administered 2023-12-06 – 2023-12-09 (×4): 60 mg via ORAL
  Filled 2023-12-06 (×5): qty 2

## 2023-12-06 MED ORDER — ACETAMINOPHEN 325 MG PO TABS
650.0000 mg | ORAL_TABLET | Freq: Four times a day (QID) | ORAL | Status: DC | PRN
Start: 2023-12-06 — End: 2023-12-09

## 2023-12-06 MED ORDER — HYDRALAZINE HCL 50 MG PO TABS
100.0000 mg | ORAL_TABLET | Freq: Once | ORAL | Status: AC
  Administered 2023-12-06: 100 mg via ORAL
  Filled 2023-12-06: qty 2

## 2023-12-06 MED ORDER — CLONIDINE HCL 0.3 MG/24HR TD PTWK: 0.3000 mg | MEDICATED_PATCH | TRANSDERMAL | Status: DC

## 2023-12-06 MED ORDER — FUROSEMIDE 40 MG PO TABS: 80.0000 mg | ORAL_TABLET | Freq: Every day | ORAL | Status: DC | PRN

## 2023-12-06 MED ORDER — AMLODIPINE BESYLATE 5 MG PO TABS
10.0000 mg | ORAL_TABLET | Freq: Once | ORAL | Status: AC
  Administered 2023-12-06: 10 mg via ORAL
  Filled 2023-12-06: qty 2

## 2023-12-06 MED ORDER — DICLOFENAC SODIUM 1 % EX GEL
2.0000 g | Freq: Two times a day (BID) | CUTANEOUS | Status: DC
  Administered 2023-12-06 – 2023-12-09 (×6): 2 g via TOPICAL
  Filled 2023-12-06 (×2): qty 100

## 2023-12-06 MED ORDER — SENNOSIDES-DOCUSATE SODIUM 8.6-50 MG PO TABS
2.0000 | ORAL_TABLET | Freq: Two times a day (BID) | ORAL | Status: DC | PRN
  Administered 2023-12-08: 2 via ORAL
  Filled 2023-12-06: qty 2

## 2023-12-06 MED ORDER — MELATONIN 5 MG PO TABS
5.0000 mg | ORAL_TABLET | Freq: Every day | ORAL | Status: DC
  Administered 2023-12-06 – 2023-12-08 (×3): 5 mg via ORAL
  Filled 2023-12-06 (×3): qty 1

## 2023-12-06 MED ORDER — FLUTICASONE PROPIONATE 50 MCG/ACT NA SUSP: 1.0000 | Freq: Every day | NASAL | Status: DC | PRN

## 2023-12-06 MED ORDER — ASPIRIN 81 MG PO TBEC
81.0000 mg | DELAYED_RELEASE_TABLET | Freq: Every day | ORAL | Status: DC
  Administered 2023-12-06 – 2023-12-09 (×4): 81 mg via ORAL
  Filled 2023-12-06 (×4): qty 1

## 2023-12-06 MED ORDER — BISACODYL 10 MG RE SUPP: 10.0000 mg | Freq: Every day | RECTAL | Status: DC | PRN

## 2023-12-06 MED ORDER — ATORVASTATIN CALCIUM 20 MG PO TABS
40.0000 mg | ORAL_TABLET | Freq: Every day | ORAL | Status: DC
  Administered 2023-12-06 – 2023-12-09 (×4): 40 mg via ORAL
  Filled 2023-12-06 (×4): qty 2

## 2023-12-06 MED ORDER — SEVELAMER CARBONATE 800 MG PO TABS
1600.0000 mg | ORAL_TABLET | Freq: Three times a day (TID) | ORAL | Status: DC
  Administered 2023-12-06 – 2023-12-09 (×10): 1600 mg via ORAL
  Filled 2023-12-06 (×11): qty 2

## 2023-12-06 MED ORDER — ACETAMINOPHEN 650 MG RE SUPP: 650.0000 mg | Freq: Four times a day (QID) | RECTAL | Status: DC | PRN

## 2023-12-06 MED ORDER — METOPROLOL TARTRATE 50 MG PO TABS
50.0000 mg | ORAL_TABLET | Freq: Two times a day (BID) | ORAL | Status: DC
  Administered 2023-12-06 – 2023-12-09 (×6): 50 mg via ORAL
  Filled 2023-12-06 (×6): qty 1

## 2023-12-06 MED ORDER — HYDRALAZINE HCL 25 MG PO TABS
100.0000 mg | ORAL_TABLET | Freq: Three times a day (TID) | ORAL | Status: DC
  Administered 2023-12-06 – 2023-12-09 (×10): 100 mg via ORAL
  Filled 2023-12-06 (×5): qty 4
  Filled 2023-12-06: qty 2
  Filled 2023-12-06 (×5): qty 4

## 2023-12-06 MED ORDER — FUROSEMIDE 40 MG PO TABS
80.0000 mg | ORAL_TABLET | Freq: Every day | ORAL | Status: DC
  Administered 2023-12-06 – 2023-12-09 (×4): 80 mg via ORAL
  Filled 2023-12-06 (×4): qty 2

## 2023-12-06 MED ORDER — IRBESARTAN 150 MG PO TABS
300.0000 mg | ORAL_TABLET | Freq: Every evening | ORAL | Status: DC
  Administered 2023-12-06 – 2023-12-08 (×3): 300 mg via ORAL
  Filled 2023-12-06 (×5): qty 2

## 2023-12-06 NOTE — Assessment & Plan Note (Signed)
 Dialysis is planned for 12/07/2023.

## 2023-12-06 NOTE — H&P (Signed)
 History and Physical    Patient: Allison Hill FMW:[id number] DOB: [birth year] DOA: 12/06/2023 DOS: the patient was seen and examined on 12/06/2023 PCP: [facility] [address]  Patient coming from: Home  Chief Complaint:  Chief Complaint  Patient presents with   Hypertension   Dizziness   HPI: Allison Hill is a 53 y.o. female with medical history significant of ESRD on HD [facility], polysubstance abuse (cocaine most recently), HFpEF, hypertension, iron deficiency anemia. She presented to the [facility] ED with complaints of chest pain and shortness of breath. She was found to have blood pressure of 221/125. She was saturating 97-100% on room air. Review of Systems: As mentioned in the history of present illness. All other systems reviewed and are negative. Past Medical History:  Diagnosis Date   CHF (congestive heart failure) (HCC)    Hypertension    Renal disorder    No past surgical history on file. Social History:  reports that she has been smoking cigarettes. She has never used smokeless tobacco. No history on file for alcohol use and drug use.  Allergies  Allergen Reactions   Ceftriaxone Shortness Of Breath and Hives    Allergy, hives   Shellfish Allergy Hives   Minoxidil Swelling    No family history on file.  Prior to Admission medications   Medication Sig Start Date End Date Taking? Authorizing Provider  acetaminophen  (TYLENOL ) 500 MG tablet Take 500 mg by mouth 3 (three) times daily.   Yes [provider]  amLODipine  (NORVASC ) 10 MG tablet Take 10 mg by mouth daily.   Yes [provider]  aspirin  EC 81 MG tablet Take 81 mg by mouth daily.   Yes [provider]  atorvastatin  (LIPITOR) 40 MG tablet Take 40 mg by mouth daily. 07/05/23  Yes [provider]  calcitRIOL  (ROCALTROL ) 0.25 MCG capsule Take 1 capsule (0.25 mcg total) by mouth Every Tuesday,Thursday,and Saturday with dialysis. Patient taking differently: Take 0.25 mcg by mouth 4 (four)  times a week. Take one capsule by mouth every Mon, Tue, Wed, Fri with dialysis 09/24/23  Yes Awanda City, MD  calcium  carbonate (TUMS - DOSED IN MG ELEMENTAL CALCIUM ) 500 MG chewable tablet Chew 2 tablets (400 mg of elemental calcium  total) by mouth 2 (two) times daily. 09/23/23  Yes Awanda City, MD  cinacalcet  (SENSIPAR ) 60 MG tablet Take 60 mg by mouth daily with supper. 11/30/23  Yes [provider]  cloNIDine  (CATAPRES  - DOSED IN MG/24 HR) 0.3 mg/24hr patch Place 1 patch onto the skin once a week. On Fridays   Yes [provider]  cloNIDine  (CATAPRES ) 0.1 MG tablet Take 0.1 mg by mouth 2 (two) times daily as needed (Diastolic BP greater than 100 with manual BP). 10/06/23  Yes [provider]  diclofenac  Sodium (VOLTAREN ) 1 % GEL Apply 2 g topically 2 (two) times daily. Apply to left shoulder.   Yes [provider]  fluticasone  (FLONASE ) 50 MCG/ACT nasal spray Place 1 spray into both nostrils daily as needed for allergies or rhinitis. 09/23/23  Yes Awanda City, MD  furosemide  (LASIX ) 80 MG tablet Take 1 tablet (80 mg total) by mouth daily as needed for edema or fluid (hypertension). 11/14/23 12/14/23 Yes Bradler, Evan K, MD  gabapentin  (NEURONTIN ) 100 MG capsule Take 200 mg by mouth 3 (three) times daily.   Yes [provider]  hydrALAZINE  (APRESOLINE ) 100 MG tablet Take 100 mg by mouth 3 (three) times daily. 12/26/22  Yes [provider]  irbesartan  (AVAPRO ) 300  MG tablet Take 1 tablet (300 mg total) by mouth every evening. 09/23/23  Yes Awanda City, MD  isosorbide  mononitrate (IMDUR ) 120 MG 24 hr tablet Take 120 mg by mouth daily.   Yes [provider]  meclizine  (ANTIVERT ) 25 MG tablet Take 1 tablet (25 mg total) by mouth 2 (two) times daily as needed for dizziness. 10/27/23  Yes Patel, Sona, MD  melatonin 3 MG TABS tablet Take 6 mg by mouth at bedtime. 12/26/22  Yes [provider]  metoprolol  tartrate (LOPRESSOR ) 50 MG tablet Take 50 mg by  mouth 2 (two) times daily. 11/12/23  Yes [provider]  nitroGLYCERIN (NITROSTAT) 0.4 MG SL tablet Place 0.4 mg under the tongue every 5 (five) minutes as needed for chest pain. 05/29/23  Yes [provider]  pantoprazole  (PROTONIX ) 40 MG tablet Take 1 tablet (40 mg total) by mouth daily. 09/24/23  Yes Awanda City, MD  polyethylene glycol (MIRALAX  / GLYCOLAX ) 17 g packet Take 17 g by mouth daily as needed. 09/23/23  Yes Awanda City, MD  senna-docusate (SENOKOT-S) 8.6-50 MG tablet Take 2 tablets by mouth 2 (two) times daily as needed for mild constipation. 09/23/23  Yes Awanda City, MD  sevelamer  carbonate (RENVELA ) 800 MG tablet Take 1,600 mg by mouth 3 (three) times daily.   Yes [provider]  terazosin  (HYTRIN ) 2 MG capsule Take 2 mg by mouth 2 (two) times daily.   Yes [provider]  epoetin  alfa (EPOGEN ) 4000 UNIT/ML injection Inject 1 mL (4,000 Units total) into the vein Every Tuesday,Thursday,and Saturday with dialysis. Patient taking differently: Inject 4,000 Units into the vein 4 (four) times a week. On Mon, Tues, Wed, Fri at dialysis 09/24/23   Awanda City, MD    Physical Exam: Vitals:   12/06/23 1135 12/06/23 1145 12/06/23 1245 12/06/23 1509  BP: (!) 169/101 (!) 173/110 (!) 183/114 (!) 179/116  Pulse:  79 72 78  Resp:  18 15   Temp:    98.2 F (36.8 C)  TempSrc:      SpO2:  97% 100%    Exam:  Constitutional:  The patient is awake, alert, and oriented x 3. No acute distress. Eyes:  pupils and irises appear normal Normal lids and conjunctivae ENMT:  grossly normal hearing  Lips appear normal external ears, nose appear normal Oropharynx: mucosa, tongue,posterior pharynx appear normal Neck:  neck appears normal, no masses, normal ROM, supple no thyromegaly Respiratory:  No increased work of breathing. No wheezes or rhonchi Positive for rales diffusely No tactile fremitus Cardiovascular:  Regular rate and rhythm No murmurs, ectopy, or  gallups. No lateral PMI. No thrills. Abdomen:  Abdomen is soft, non-tender, non-distended No hernias, masses, or organomegaly Normoactive bowel sounds.  Musculoskeletal:  No cyanosis, clubbing, or edema Skin:  No rashes, lesions, ulcers palpation of skin: no induration or nodules Neurologic:  CN 2-12 intact Sensation all 4 extremities intact Psychiatric:  Mental status Mood, affect appropriate Orientation to person, place, time  judgment and insight appear intact  Data Reviewed:  EKG CMP CBC CXR  Assessment and Plan: Pulmonary edema Chest x-ray demonstrated cardiac enlargement with pulmonary vascular congestion. The patient has been started on lasix  80 mg daily The patient is due for HD on 12/07/2023.  Hypertensive emergency The patient has received lasix  80 mg IV x 1. HD planned for tomorrow.   Cocaine use Noted. No UDS obtained upon this admission.  ESRD on dialysis [facility]) Dialysis is planned for 12/07/2023.  Chronic heart failure  with preserved ejection fraction (HFpEF) (HCC) Noted. Echocardiogram is ordered. Heart failure navigation team is consulted.  Hypertension Uncontrolled HTN with resultant pulmonary edema. Lasix  80 mg IV today. HD for tomorrow.  I have seen and examined this patient myself. I have spent 74 minutes in her evaluation and admission.   Advance Care Planning:   Code Status: Full Code   Consults: Nephrology  Family Communication: None available  Severity of Illness: The appropriate patient status for this patient is INPATIENT. Inpatient status is judged to be reasonable and necessary in order to provide the required intensity of service to ensure the patient's safety. The patient's presenting symptoms, physical exam findings, and initial radiographic and laboratory data in the context of their chronic comorbidities is felt to place them at high risk for further clinical deterioration. Furthermore, it is not anticipated that the patient will be  medically stable for discharge from the hospital within 2 midnights of admission.   * I certify that at the point of admission it is my clinical judgment that the patient will require inpatient hospital care spanning beyond 2 midnights from the point of admission due to high intensity of service, high risk for further deterioration and high frequency of surveillance required.*  Author: Anaka Beazer, DO 12/06/2023 7:11 PM  For on call review [email or web address].

## 2023-12-06 NOTE — Assessment & Plan Note (Signed)
 Noted. No UDS obtained upon this admission.

## 2023-12-06 NOTE — ED Triage Notes (Signed)
 pT TO ed VIA ems from [facility]. Dialysis pt last went Friday. Pt c/o dizziness, and HTN. EKG SR. BP: 210/129 Pt on RA. Denies pain. Edema in ankles.

## 2023-12-06 NOTE — ED Provider Notes (Signed)
 [facility] Provider Note    Event Date/Time   First MD Initiated Contact with Patient 12/06/23 0701     (approximate)   History   Hypertension and Dizziness   HPI  Allison Hill is a 53 y.o. female past medical history significant for CHF, hypertension, ESRD on hemodialysis MTWF, iron deficiency anemia, substance use disorder, who presents to the emergency department shortness of breath and chest pain.  States that she started feeling poorly approximately 2 or 3 hours prior to arrival.  Complaining of chest pressure and tightness.  Also complaining of shortness of breath.  Complaining of dizziness this morning and feeling lightheaded.  Did not miss any dialysis sessions the last 1 on Friday.  Had her clonidine  patch changed on Friday.  Uncertain if she had her antihypertensive medications this morning.  Denies nausea, vomiting or diaphoresis.  Does still make urine.  Denies any abdominal pain.  No significant swelling in her legs.  Denies DVT or PE.  Denies missing any home medications.  Denies cough.     Physical Exam   Triage Vital Signs: ED Triage Vitals  Encounter Vitals Group     BP 12/06/23 0645 (!) 221/125     Girls Systolic BP Percentile --      Girls Diastolic BP Percentile --      Boys Systolic BP Percentile --      Boys Diastolic BP Percentile --      Pulse Rate 12/06/23 0645 78     Resp 12/06/23 0652 17     Temp 12/06/23 0652 97.8 F (36.6 C)     Temp Source 12/06/23 0652 Oral     SpO2 12/06/23 0645 98 %     Weight --      Height --      Head Circumference --      Peak Flow --      Pain Score --      Pain Loc --      Pain Education --      Exclude from Growth Chart --     Most recent vital signs: Vitals:   12/06/23 0847 12/06/23 0900  BP: (!) 152/92 (!) 171/95  Pulse: 80 78  Resp:  20  Temp:    SpO2:  99%    Physical Exam Constitutional:      Appearance: She is well-developed.  HENT:     Head: Atraumatic.  Eyes:      Conjunctiva/sclera: Conjunctivae normal.  Cardiovascular:     Rate and Rhythm: Regular rhythm.  Pulmonary:     Effort: No respiratory distress.     Breath sounds: Rales present.  Abdominal:     General: There is no distension.     Tenderness: There is no abdominal tenderness.  Musculoskeletal:        General: Normal range of motion.     Cervical back: Normal range of motion.     Right lower leg: No edema.  Skin:    General: Skin is warm.     Capillary Refill: Capillary refill takes less than 2 seconds.  Neurological:     Mental Status: She is alert. Mental status is at baseline.     IMPRESSION / MDM / ASSESSMENT AND PLAN / ED COURSE  I reviewed the triage vital signs and the nursing notes.  Differential diagnosis including hypertensive emergency, pulmonary edema, ACS, anemia, pericardial effusion, pericarditis.  No tearing chest pain, have low suspicion for dissection.  No pleuritic pain, no  history, have low suspicion for pulmonary embolism.  EKG  I, Clotilda Punter, the attending physician, personally viewed and interpreted this ECG.  EKG showed normal sinus rhythm.  Signs of atrial enlargement.  No significant ST elevation or depression.  No findings of acute ischemia or dysrhythmia  No tachycardic or bradycardic dysrhythmias while on cardiac telemetry.  RADIOLOGY I independently reviewed imaging, my interpretation of imaging: PermCath in place.  Cardiomegaly.  No significant pulmonary edema or pneumonia noted  LABS (all labs ordered are listed, but only abnormal results are displayed) Labs interpreted as -    Labs Reviewed  BRAIN NATRIURETIC PEPTIDE - Abnormal; Notable for the following components:      Result Value   B Natriuretic Peptide 2,085.3 (*)    All other components within normal limits  COMPREHENSIVE METABOLIC PANEL WITH GFR - Abnormal; Notable for the following components:   BUN 72 (*)    Creatinine, Ser 6.88 (*)    Calcium  8.8 (*)    Alkaline  Phosphatase 350 (*)    GFR, Estimated 7 (*)    All other components within normal limits  CBC WITH DIFFERENTIAL/PLATELET - Abnormal; Notable for the following components:   RBC 2.94 (*)    Hemoglobin 7.9 (*)    HCT 24.9 (*)    RDW 19.8 (*)    All other components within normal limits  TROPONIN I (HIGH SENSITIVITY) - Abnormal; Notable for the following components:   Troponin I (High Sensitivity) 36 (*)    All other components within normal limits  TROPONIN I (HIGH SENSITIVITY) - Abnormal; Notable for the following components:   Troponin I (High Sensitivity) 34 (*)    All other components within normal limits  RESP PANEL BY RT-PCR (RSV, FLU A&B, COVID)  [id number]  URINALYSIS, W/ REFLEX TO CULTURE (INFECTION SUSPECTED)     MDM    Patient significantly hypertensive on arrival with blood pressure of 221/130.  No significant hypoxia.  Concern for hypertensive emergency.  Has not missed dialysis sessions.  Plan for lab work.  She does have a clonidine  patch in place and was exchanged on Friday.  Will order home antihypertensive medications with clonidine , hydralazine  and amlodipine .  Continued to be hypertensive ordered another dose of metoprolol   Difficult IV stick will require ultrasound-guided IV.  Creatinine appears better baseline.  Elevated BUN at 72.  No significant electrolyte abnormality.  BNP significantly elevated in the 2000's troponin elevated at 36 but appears to be chronically elevated likely in the setting of her ESRD.  Anemia but hemoglobin is stable at 7.9 and last hemoglobin check was also 7.9.  Normal platelets.  No leukocytosis.  COVID influenza testing are negative.  Chest x-ray with signs of pulmonary vascular congestion.  On reevaluation blood pressure significantly improved.  Continues to be short of breath but oxygenation at 100% at this time.  Discussed the patient's case with nephrology on-call Dr. Douglas, recommended admission to the hospitalist will come to evaluate  to see if they need to do dialysis session today or if can wait till tomorrow.  Second troponin pending.  Consulted hospitalist for admission.   PROCEDURES:  Critical Care performed: yes  Ultrasound ED Echo  Date/Time: 12/06/2023 7:16 AM  Performed by: Punter Clotilda, MD Authorized by: Punter Clotilda, MD   Procedure details:    Indications: dyspnea     Views: subxiphoid, parasternal long axis view, parasternal short axis view and IVC view     Images: not archived     Limitations:  Positioning and acoustic shadowing Findings:    Pericardium: no pericardial effusion     LV Function: depressed (30 - 50%)     RV Diameter: normal     IVC: normal   Impression:    Impression: probable low CVP   .Ultrasound ED Peripheral IV (Provider)  Date/Time: 12/06/2023 8:39 AM  Performed by: Suzanne Kirsch, MD Authorized by: Suzanne Kirsch, MD   Procedure details:    Indications: multiple failed IV attempts     Skin Prep: chlorhexidine  gluconate     Location:  Right forearm   Angiocath:  20 G   Bedside Ultrasound Guided: Yes     Images: not archived     Patient tolerated procedure without complications: Yes     Dressing applied: Yes   .Critical Care  Performed by: Suzanne Kirsch, MD Authorized by: Suzanne Kirsch, MD   Critical care provider statement:    Critical care time (minutes):  30   Critical care time was exclusive of:  Separately billable procedures and treating other patients   Critical care was necessary to treat or prevent imminent or life-threatening deterioration of the following conditions:  Circulatory failure   Critical care was time spent personally by me on the following activities:  Development of treatment plan with patient or surrogate, discussions with consultants, evaluation of patient's response to treatment, examination of patient, ordering and review of laboratory studies, ordering and review of radiographic studies, ordering and performing treatments and  interventions, pulse oximetry, re-evaluation of patient's condition and review of old charts   Patient's presentation is most consistent with acute presentation with potential threat to life or bodily function.   MEDICATIONS ORDERED IN ED: Medications  cloNIDine  (CATAPRES ) tablet 0.1 mg (0.1 mg Oral Given 12/06/23 0714)  hydrALAZINE  (APRESOLINE ) tablet 100 mg (100 mg Oral Given 12/06/23 0714)  amLODipine  (NORVASC ) tablet 10 mg (10 mg Oral Given 12/06/23 0719)  metoprolol  tartrate (LOPRESSOR ) tablet 50 mg (50 mg Oral Given 12/06/23 0847)    FINAL CLINICAL IMPRESSION(S) / ED DIAGNOSES   Final diagnoses:  Hypertensive emergency     Rx / DC Orders   ED Discharge Orders     None        Note:  This document was prepared using Dragon voice recognition software and may include unintentional dictation errors.   Suzanne Kirsch, MD 12/06/23 [phone]

## 2023-12-06 NOTE — Assessment & Plan Note (Signed)
 Chest x-ray demonstrated cardiac enlargement with pulmonary vascular congestion. The patient has been started on lasix  80 mg daily The patient is due for HD on 12/07/2023.

## 2023-12-06 NOTE — Assessment & Plan Note (Signed)
 The patient has received lasix  80 mg IV x 1. HD planned for tomorrow.

## 2023-12-06 NOTE — Plan of Care (Signed)

## 2023-12-06 NOTE — Assessment & Plan Note (Signed)
 Noted. Echocardiogram is ordered. Heart failure navigation team is consulted.

## 2023-12-06 NOTE — ED Notes (Addendum)
 Attempted to get pt to bedside commode for urine sample but patient went in brief prior. Pt brief changed at this time. PT repositioned in bed. No other needs expressed at this time.

## 2023-12-06 NOTE — ED Notes (Signed)
 Swayze, DO at bedside with patient.

## 2023-12-06 NOTE — Progress Notes (Signed)
 [facility]  ROUNDING NOTE   Subjective:   [name] was admitted to [facility] on 12/06/2023 for hypertension  Last hemodialysis treatment was Friday.   Patient presented with chest pain after eating. Patiet treated with NTG. Patient complains of headache, blurry vision, lower extremity swelling, dizziness/lightheadedness and dull chest pain when examined.   Objective:  Vital signs in last 24 hours:  Temp:  [97.7 F (36.5 C)-97.8 F (36.6 C)] 97.7 F (36.5 C) (07/06 1035) Pulse Rate:  [76-82] 79 (07/06 1145) Resp:  [0-20] 18 (07/06 1145) BP: (152-224)/(91-130) 173/110 (07/06 1145) SpO2:  [97 %-99 %] 97 % (07/06 1145)  Weight change:  There were no vitals filed for this visit.  Intake/Output: No intake/output data recorded.   Intake/Output this shift:  No intake/output data recorded.  Physical Exam: General: NAD, laying on stretcher  Head: Normocephalic, atraumatic. Moist oral mucosal membranes  Eyes: Anicteric, PERRL  Neck: Supple, trachea midline  Lungs:  Clear to auscultation  Heart: Regular rate and rhythm  Abdomen:  Soft, nontender,   Extremities:  + peripheral edema.  Neurologic: Nonfocal, moving all four extremities  Skin: No lesions  Access: RIJ permcath    Basic Metabolic Panel: Recent Labs  Lab 12/06/23 0735  NA 140  K 4.1  CL 101  CO2 26  GLUCOSE 98  BUN 72*  CREATININE 6.88*  CALCIUM  8.8*    Liver Function Tests: Recent Labs  Lab 12/06/23 0735  AST 16  ALT 8  ALKPHOS 350*  BILITOT 0.7  PROT 7.1  ALBUMIN 3.6   No results for input(s): LIPASE, AMYLASE in the last 168 hours. No results for input(s): AMMONIA in the last 168 hours.  CBC: Recent Labs  Lab 12/06/23 0735  WBC 6.8  NEUTROABS 4.5  HGB 7.9*  HCT 24.9*  MCV 84.7  PLT 173    Cardiac Enzymes: No results for input(s): CKTOTAL, CKMB, CKMBINDEX, TROPONINI in the last 168 hours.  BNP: Invalid input(s): POCBNP  CBG: No results for  input(s): GLUCAP in the last 168 hours.  Microbiology: Results for orders placed or performed during the hospital encounter of 12/06/23  Resp panel by RT-PCR (RSV, Flu A&B, Covid) Anterior Nasal Swab     Status: None   Collection Time: 12/06/23  7:00 AM   Specimen: Anterior Nasal Swab  Result Value Ref Range Status   SARS Coronavirus 2 by RT PCR NEGATIVE NEGATIVE Final    Comment: (NOTE) SARS-CoV-2 target nucleic acids are NOT DETECTED.  The SARS-CoV-2 RNA is generally detectable in upper respiratory specimens during the acute phase of infection. The lowest concentration of SARS-CoV-2 viral copies this assay can detect is 138 copies/mL. A negative result does not preclude SARS-Cov-2 infection and should not be used as the sole basis for treatment or other patient management decisions. A negative result may occur with  improper specimen collection/handling, submission of specimen other than nasopharyngeal swab, presence of viral mutation(s) within the areas targeted by this assay, and inadequate number of viral copies(<138 copies/mL). A negative result must be combined with clinical observations, patient history, and epidemiological information. The expected result is Negative.  Fact Sheet for Patients:  BloggerCourse.com  Fact Sheet for Healthcare Providers:  SeriousBroker.it  This test is no t yet approved or cleared by the [place]  FDA and  has been authorized for detection and/or diagnosis of SARS-CoV-2 by FDA under an Emergency Use Authorization (EUA). This EUA will remain  in effect (meaning this test can be used) for  the duration of the COVID-19 declaration under Section 564(b)(1) of the Act, 21 U.S.C.section 360bbb-3(b)(1), unless the authorization is terminated  or revoked sooner.       Influenza A by PCR NEGATIVE NEGATIVE Final   Influenza B by PCR NEGATIVE NEGATIVE Final    Comment: (NOTE) The Xpert Xpress  SARS-CoV-2/FLU/RSV plus assay is intended as an aid in the diagnosis of influenza from Nasopharyngeal swab specimens and should not be used as a sole basis for treatment. Nasal washings and aspirates are unacceptable for Xpert Xpress SARS-CoV-2/FLU/RSV testing.  Fact Sheet for Patients: BloggerCourse.com  Fact Sheet for Healthcare Providers: SeriousBroker.it  This test is not yet approved or cleared by the [place]  FDA and has been authorized for detection and/or diagnosis of SARS-CoV-2 by FDA under an Emergency Use Authorization (EUA). This EUA will remain in effect (meaning this test can be used) for the duration of the COVID-19 declaration under Section 564(b)(1) of the Act, 21 U.S.C. section 360bbb-3(b)(1), unless the authorization is terminated or revoked.     Resp Syncytial Virus by PCR NEGATIVE NEGATIVE Final    Comment: (NOTE) Fact Sheet for Patients: BloggerCourse.com  Fact Sheet for Healthcare Providers: SeriousBroker.it  This test is not yet approved or cleared by the [place]  FDA and has been authorized for detection and/or diagnosis of SARS-CoV-2 by FDA under an Emergency Use Authorization (EUA). This EUA will remain in effect (meaning this test can be used) for the duration of the COVID-19 declaration under Section 564(b)(1) of the Act, 21 U.S.C. section 360bbb-3(b)(1), unless the authorization is terminated or revoked.  Performed at [facility], [address] [place], [place] [address]     Coagulation Studies: No results for input(s): LABPROT, INR in the last 72 hours.  Urinalysis: No results for input(s): COLORURINE, LABSPEC, PHURINE, GLUCOSEU, HGBUR, BILIRUBINUR, KETONESUR, PROTEINUR, UROBILINOGEN, NITRITE, LEUKOCYTESUR in the last 72 hours.  Invalid input(s): APPERANCEUR    Imaging: DG Chest 1  View Result Date: 12/06/2023 CLINICAL DATA:  Shortness of breath. Dialysis was last Friday. Dizziness and hypertension. EXAM: CHEST  1 VIEW COMPARISON:  11/27/2023 FINDINGS: Right chest wall dialysis catheter is noted with tips at the superior cavoatrial junction. Cardiac enlargement. Aortic atherosclerotic calcifications. Pulmonary vascular congestion. No frank edema or pleural effusion. No airspace consolidation. Visualized osseous structures are unremarkable. IMPRESSION: Cardiac enlargement and pulmonary vascular congestion. Electronically Signed   By: Waddell Calk M.D.   On: 12/06/2023 07:16     Medications:     acetaminophen   500 mg Oral TID   [START ON 12/07/2023] amLODipine   10 mg Oral Daily   aspirin  EC  81 mg Oral Daily   atorvastatin   40 mg Oral Daily   calcium  carbonate  400 mg of elemental calcium  Oral BID WC   cinacalcet   60 mg Oral Q supper   [START ON 12/11/2023] cloNIDine   0.3 mg Transdermal Weekly   diclofenac  Sodium  2 g Topical BID   gabapentin   200 mg Oral TID   hydrALAZINE   100 mg Oral TID   irbesartan   300 mg Oral QPM   melatonin  5 mg Oral QHS   metoprolol  tartrate  50 mg Oral BID   [START ON 12/07/2023] pantoprazole   40 mg Oral Daily   sevelamer  carbonate  1,600 mg Oral TID WC   terazosin   2 mg Oral BID   fluticasone , furosemide , polyethylene glycol, senna-docusate  Assessment/ Plan:  [name] is a 53 y.o.  female with end stage renal disease on  hemodialysis, hypertension, congestive heart failure, migraines, intracranial hemorrhage, cocaine abuse and sickle cell trait who is admitted to [facility] on 12/06/2023 for hypertension  End stage renal disease: MTWF dialysis (four times a week) at [facility]. Followed by [facility].  - dialysis for tomorrow.   Hypertension with chronic kidney disease: urgency on admission with acute exacerbation of congestive heart failure - resumed irbesartan , furosemide , metoprolol , amlodipine , and terazosin   Anemia with chronic kidney  disease and sickle cell trait - ESA with HD treatments  Secondary Hyperparathyroidism:  - cinacalcet  - sevelamer  with meals.    LOS: 0 Clebert Wenger 7/6/202512:31 PM

## 2023-12-06 NOTE — Assessment & Plan Note (Signed)
 Uncontrolled HTN with resultant pulmonary edema. Lasix  80 mg IV today. HD for tomorrow.

## 2023-12-07 DIAGNOSIS — F1490 Cocaine use, unspecified, uncomplicated: Secondary | ICD-10-CM

## 2023-12-07 DIAGNOSIS — Z992 Dependence on renal dialysis: Secondary | ICD-10-CM

## 2023-12-07 DIAGNOSIS — J810 Acute pulmonary edema: Secondary | ICD-10-CM

## 2023-12-07 DIAGNOSIS — N186 End stage renal disease: Secondary | ICD-10-CM | POA: Diagnosis not present

## 2023-12-07 DIAGNOSIS — I10 Essential (primary) hypertension: Secondary | ICD-10-CM

## 2023-12-07 DIAGNOSIS — I160 Hypertensive urgency: Secondary | ICD-10-CM

## 2023-12-07 DIAGNOSIS — I5032 Chronic diastolic (congestive) heart failure: Secondary | ICD-10-CM | POA: Diagnosis not present

## 2023-12-07 LAB — CBC
HCT: 24.1 % — ABNORMAL LOW (ref 36.0–46.0)
Hemoglobin: 7.8 g/dL — ABNORMAL LOW (ref 12.0–15.0)
MCH: 26.8 pg (ref 26.0–34.0)
MCHC: 32.4 g/dL (ref 30.0–36.0)
MCV: 82.8 fL (ref 80.0–100.0)
Platelets: 151 K/uL (ref 150–400)
RBC: 2.91 MIL/uL — ABNORMAL LOW (ref 3.87–5.11)
RDW: 19.8 % — ABNORMAL HIGH (ref 11.5–15.5)
WBC: 7 K/uL (ref 4.0–10.5)
nRBC: 0 % (ref 0.0–0.2)

## 2023-12-07 LAB — BASIC METABOLIC PANEL WITH GFR
Anion gap: 13 (ref 5–15)
BUN: 85 mg/dL — ABNORMAL HIGH (ref 6–20)
CO2: 23 mmol/L (ref 22–32)
Calcium: 8.1 mg/dL — ABNORMAL LOW (ref 8.9–10.3)
Chloride: 104 mmol/L (ref 98–111)
Creatinine, Ser: 7.29 mg/dL — ABNORMAL HIGH (ref 0.44–1.00)
GFR, Estimated: 6 mL/min — ABNORMAL LOW (ref >60)
Glucose, Bld: 96 mg/dL (ref 70–99)
Potassium: 4.3 mmol/L (ref 3.5–5.1)
Sodium: 140 mmol/L (ref 135–145)

## 2023-12-07 LAB — URINALYSIS, W/ REFLEX TO CULTURE (INFECTION SUSPECTED)
Bacteria, UA: NONE SEEN
Bilirubin Urine: NEGATIVE
Glucose, UA: NEGATIVE mg/dL
Hgb urine dipstick: NEGATIVE
Ketones, ur: NEGATIVE mg/dL
Leukocytes,Ua: NEGATIVE
Nitrite: NEGATIVE
Protein, ur: 30 mg/dL — AB
Specific Gravity, Urine: 1.008 (ref 1.005–1.030)
pH: 8 (ref 5.0–8.0)

## 2023-12-07 LAB — URINE DRUG SCREEN, QUALITATIVE (ARMC ONLY)
Amphetamines, Ur Screen: NOT DETECTED
Barbiturates, Ur Screen: NOT DETECTED
Benzodiazepine, Ur Scrn: NOT DETECTED
Cannabinoid 50 Ng, Ur ~~LOC~~: NOT DETECTED
Cocaine Metabolite,Ur ~~LOC~~: NOT DETECTED
MDMA (Ecstasy)Ur Screen: NOT DETECTED
Methadone Scn, Ur: NOT DETECTED
Opiate, Ur Screen: NOT DETECTED
Phencyclidine (PCP) Ur S: NOT DETECTED
Tricyclic, Ur Screen: NOT DETECTED

## 2023-12-07 LAB — HEPATITIS B SURFACE ANTIGEN: Hepatitis B Surface Ag: NONREACTIVE

## 2023-12-07 MED ORDER — HYDROXYZINE HCL 10 MG PO TABS
10.0000 mg | ORAL_TABLET | Freq: Three times a day (TID) | ORAL | Status: DC | PRN
  Administered 2023-12-07 – 2023-12-09 (×4): 10 mg via ORAL
  Filled 2023-12-07 (×6): qty 1

## 2023-12-07 MED ORDER — OXYCODONE HCL 5 MG PO TABS
5.0000 mg | ORAL_TABLET | ORAL | Status: AC | PRN
  Administered 2023-12-07 – 2023-12-08 (×2): 5 mg via ORAL
  Filled 2023-12-07 (×2): qty 1

## 2023-12-07 MED ORDER — ISOSORBIDE MONONITRATE ER 60 MG PO TB24
120.0000 mg | ORAL_TABLET | Freq: Every day | ORAL | Status: DC
  Administered 2023-12-07 – 2023-12-09 (×3): 120 mg via ORAL
  Filled 2023-12-07 (×3): qty 2

## 2023-12-07 NOTE — Assessment & Plan Note (Signed)
 Chest x-ray concerning for some pulmonary vascular congestion which improved with IV Lasix .  Patient still making some urine.  Per nephrology patient has a tendency to sign her off early without completing the dialysis most of the time which need to be addressed.  - Continue with Lasix  -Going for dialysis today

## 2023-12-07 NOTE — Plan of Care (Signed)
   Problem: Education: Goal: Knowledge of General Education information will improve Description Including pain rating scale, medication(s)/side effects and non-pharmacologic comfort measures Outcome: Progressing

## 2023-12-07 NOTE — Progress Notes (Signed)
 Progress Note   Patient: Allison Hill FMW:[id number] DOB: 08-Nov-1970 DOA: 12/06/2023     1 DOS: the patient was seen and examined on 12/07/2023   Brief hospital course: Taken from H&P.   Allison Hill is a 53 y.o. female with medical history significant of ESRD on HD MTWF, polysubstance abuse (cocaine most recently), HFpEF, hypertension, iron deficiency anemia. She presented to the [facility] ED with complaints of chest pain and shortness of breath.    Patient was found to have significantly elevated blood pressure at 221/125, chest x-ray concerning for pulmonary vascular congestion.  Patient was given IV Lasix  80 mg and nephrology was consulted for dialysis.  7/7: Blood pressure remained elevated at 203/125, Troponin mildly elevated with a flat curve.  BNP of 2085.  UA and UDS was negative. Echocardiogram ordered-pending Per patient she was not getting all of her blood pressure medications at her facility and they were only giving her hydralazine .  Medication need to be confirmed with facility-asked TOC.  Nephrology could be very specific about what need to be given before and after dialysis on dialysis days.  Patient also does not complete her full session of dialysis most of the time per nephrology, counseling was provided as she need to complete full sessions and stop signing her out. Going for dialysis today.  Assessment and Plan: * Hypertensive urgency Patient admitted with significantly elevated blood pressure above 200 systolic, chest x-ray with concern of some pulmonary vascular congestion.  Mildly elevated troponin with a flat curve.  Patient was on multiple antihypertensives which include amlodipine , hydralazine , clonidine , irbesartan , Imdur  and metoprolol . Per patient she was not getting all of her blood pressure medications at her facility which need to be verified.  Per patient she was only getting hydralazine .  - Continue current medications and need to be addressed the  importance of taking it regularly with facility.  Pulmonary edema Chest x-ray concerning for some pulmonary vascular congestion which improved with IV Lasix .  Patient still making some urine.  Per nephrology patient has a tendency to sign her off early without completing the dialysis most of the time which need to be addressed.  - Continue with Lasix  -Going for dialysis today  Chronic heart failure with preserved ejection fraction (HFpEF) (HCC) Although BNP elevated but patient is a dialysis patient.  Currently on room air.  Initial imaging concerning for pulmonary edema. Repeat echocardiogram ordered-pending -Continue to monitor  ESRD on dialysis [facility]) Going for her routine dialysis today. Patient was counseled regarding the importance of completing full dialysis sessions each time as she has a tendency to sign her off early without removing all of the required fluid. - Nephrology is on board and managing dialysis - Continue Renvela   Cocaine use Per patient she has not used cocaine for a year now.  UDS was negative   Subjective: Patient was seen and examined today.  Denies any chest pain or shortness of breath.  Per patient she has not used cocaine in a year.  She lives in a facility and was concerned they were not giving her all of her blood pressure medications.  Physical Exam: Vitals:   12/07/23 0352 12/07/23 0500 12/07/23 0735 12/07/23 0931  BP:  (!) 184/107 (!) 203/125 (!) 166/91  Pulse:  78 81 88  Resp:  18  19  Temp:  97.7 F (36.5 C) 97.9 F (36.6 C)   TempSrc:      SpO2:  100% 97% 97%  Weight: 92.7 kg  General.  Well-developed lady, in no acute distress. Pulmonary.  Lungs clear bilaterally, normal respiratory effort. CV.  Regular rate and rhythm, no JVD, rub or murmur. Abdomen.  Soft, nontender, nondistended, BS positive. CNS.  Alert and oriented .  No focal neurologic deficit. Extremities.  No edema,  pulses intact and symmetrical. Psychiatry.  Judgment  and insight appears normal.   Data Reviewed: Prior data reviewed  Family Communication: Discussed with patient  Disposition: Status is: Inpatient Remains inpatient appropriate because: Severity of illness  Planned Discharge Destination: Skilled nursing facility  DVT prophylaxis.  Subcu heparin  Time spent: 50 minutes  This record has been created using Conservation officer, historic buildings. Errors have been sought and corrected,but may not always be located. Such creation errors do not reflect on the standard of care.   Author: Amaryllis Dare, MD 12/07/2023 11:48 AM  For on call review [email or web address].

## 2023-12-07 NOTE — Assessment & Plan Note (Addendum)
 Patient admitted with significantly elevated blood pressure above 200 systolic, chest x-ray with concern of some pulmonary vascular congestion.  Mildly elevated troponin with a flat curve.  Patient was on multiple antihypertensives which include amlodipine , hydralazine , clonidine , irbesartan , Imdur  and metoprolol . Per patient she was not getting all of her blood pressure medications at her facility which need to be verified.  Per patient she was only getting hydralazine .  - Continue current medications and need to be addressed the importance of taking it regularly with facility.

## 2023-12-07 NOTE — Progress Notes (Signed)
 Transition of Care [facility]) - Inpatient Brief Assessment   Patient Details  Name: Allison Hill MRN: [id number] Date of Birth: 1971/05/12  Transition of Care [facility]) CM/SW Contact:    Shadawn Hanaway C Arleta Ostrum, RN Phone Number: 12/07/2023, 10:19 AM   Clinical Narrative: TOC continuing to follow patient's progress throughout discharge planning.   Transition of Care Asessment: Insurance and Status: Insurance coverage has been reviewed Patient has primary care physician: Yes   Prior level of function:: Independent Prior/Current Home Services: No current home services Social Drivers of Health Review: SDOH reviewed no interventions necessary Readmission risk has been reviewed: Yes Transition of care needs: no transition of care needs at this time

## 2023-12-07 NOTE — Procedures (Signed)
 Received patient in bed to unit.  Alert and oriented.  Informed consent signed and in chart.   TX duration: 3hrs and 15 mins.  Patient tolerated well.  Transported back to the room  Alert, without acute distress.  Hand-off given to patient's nurse.   Access used: Left uppar arm fistula.  Access issues: NONE  Total UF removed: Medication(s) given: Epogen  10000 units.   [facility]

## 2023-12-07 NOTE — Assessment & Plan Note (Signed)
 Although BNP elevated but patient is a dialysis patient.  Currently on room air.  Initial imaging concerning for pulmonary edema. Repeat echocardiogram ordered-pending -Continue to monitor

## 2023-12-07 NOTE — Assessment & Plan Note (Signed)
 Per patient she has not used cocaine for a year now.  UDS was negative

## 2023-12-07 NOTE — Hospital Course (Addendum)
 Taken from H&P.   Allison Hill is a 53 y.o. female with medical history significant of ESRD on HD MTWF, polysubstance abuse (cocaine most recently), HFpEF, hypertension, iron deficiency anemia. She presented to the [facility] ED with complaints of chest pain and shortness of breath.    Patient was found to have significantly elevated blood pressure at 221/125, chest x-ray concerning for pulmonary vascular congestion.  Patient was given IV Lasix  80 mg and nephrology was consulted for dialysis.  7/7: Blood pressure remained elevated at 203/125, Troponin mildly elevated with a flat curve.  BNP of 2085.  UA and UDS was negative. Echocardiogram ordered-pending Per patient she was not getting all of her blood pressure medications at her facility and they were only giving her hydralazine .  Medication need to be confirmed with facility-asked TOC.  Nephrology could be very specific about what need to be given before and after dialysis on dialysis days.  Patient also does not complete her full session of dialysis most of the time per nephrology, counseling was provided as she need to complete full sessions and stop signing her out. Going for dialysis today.  7/8: Blood pressure remained elevated, discussed with nephrology and they were recommending outpatient follow-up as it is improved than before.  Patient with concern of cramping during dialysis as outpatient, nephrology will address. Echocardiogram done-pending results

## 2023-12-07 NOTE — Progress Notes (Signed)
 [facility]  ROUNDING NOTE   Subjective:   Allison Hill was admitted to [facility] on 12/06/2023 for Hypertensive emergency [I16.1]  Last hemodialysis treatment was Friday.   Patient seen laying in bed States she is not getting her meds at [facility] Admits to missing treatments and cutting treatments short.   Objective:  Vital signs in last 24 hours:  Temp:  [97.7 F (36.5 C)-98.9 F (37.2 C)] 98.8 F (37.1 C) (07/07 1345) Pulse Rate:  [75-88] 82 (07/07 1530) Resp:  [13-21] 21 (07/07 1530) BP: (148-203)/(91-125) 162/101 (07/07 1530) SpO2:  [97 %-100 %] 99 % (07/07 1530) Weight:  [90.7 kg-92.7 kg] 90.7 kg (07/07 1345)  Weight change:  Filed Weights   12/07/23 0352 12/07/23 1201 12/07/23 1345  Weight: 92.7 kg 90.7 kg 90.7 kg    Intake/Output: I/O last 3 completed shifts: In: 120 [P.O.:120] Out: -    Intake/Output this shift:  Total I/O In: 240 [P.O.:240] Out: 300 [Urine:300]  Physical Exam: General: NAD, laying on stretcher  Head: Normocephalic, atraumatic. Moist oral mucosal membranes  Eyes: Anicteric  Lungs:  Clear to auscultation, room air  Heart: Regular rate and rhythm  Abdomen:  Soft, nontender  Extremities:  + peripheral edema.  Neurologic: Alert, awake  Skin: No lesions  Access: RIJ permcath    Basic Metabolic Panel: Recent Labs  Lab 12/06/23 0735 12/07/23 0028  NA 140 140  K 4.1 4.3  CL 101 104  CO2 26 23  GLUCOSE 98 96  BUN 72* 85*  CREATININE 6.88* 7.29*  CALCIUM  8.8* 8.1*    Liver Function Tests: Recent Labs  Lab 12/06/23 0735  AST 16  ALT 8  ALKPHOS 350*  BILITOT 0.7  PROT 7.1  ALBUMIN 3.6   No results for input(s): LIPASE, AMYLASE in the last 168 hours. No results for input(s): AMMONIA in the last 168 hours.  CBC: Recent Labs  Lab 12/06/23 0735 12/07/23 0028  WBC 6.8 7.0  NEUTROABS 4.5  --   HGB 7.9* 7.8*  HCT 24.9* 24.1*  MCV 84.7 82.8  PLT 173 151    Cardiac Enzymes: No results for  input(s): CKTOTAL, CKMB, CKMBINDEX, TROPONINI in the last 168 hours.  BNP: Invalid input(s): POCBNP  CBG: No results for input(s): GLUCAP in the last 168 hours.  Microbiology: Results for orders placed or performed during the hospital encounter of 12/06/23  Resp panel by RT-PCR (RSV, Flu A&B, Covid) Anterior Nasal Swab     Status: None   Collection Time: 12/06/23  7:00 AM   Specimen: Anterior Nasal Swab  Result Value Ref Range Status   SARS Coronavirus 2 by RT PCR NEGATIVE NEGATIVE Final    Comment: (NOTE) SARS-CoV-2 target nucleic acids are NOT DETECTED.  The SARS-CoV-2 RNA is generally detectable in upper respiratory specimens during the acute phase of infection. The lowest concentration of SARS-CoV-2 viral copies this assay can detect is 138 copies/mL. A negative result does not preclude SARS-Cov-2 infection and should not be used as the sole basis for treatment or other patient management decisions. A negative result may occur with  improper specimen collection/handling, submission of specimen other than nasopharyngeal swab, presence of viral mutation(s) within the areas targeted by this assay, and inadequate number of viral copies(<138 copies/mL). A negative result must be combined with clinical observations, patient history, and epidemiological information. The expected result is Negative.  Fact Sheet for Patients:  BloggerCourse.com  Fact Sheet for Healthcare Providers:  SeriousBroker.it  This test is no t yet  approved or cleared by the [place]  FDA and  has been authorized for detection and/or diagnosis of SARS-CoV-2 by FDA under an Emergency Use Authorization (EUA). This EUA will remain  in effect (meaning this test can be used) for the duration of the COVID-19 declaration under Section 564(b)(1) of the Act, 21 U.S.C.section 360bbb-3(b)(1), unless the authorization is terminated  or revoked sooner.        Influenza A by PCR NEGATIVE NEGATIVE Final   Influenza B by PCR NEGATIVE NEGATIVE Final    Comment: (NOTE) The Xpert Xpress SARS-CoV-2/FLU/RSV plus assay is intended as an aid in the diagnosis of influenza from Nasopharyngeal swab specimens and should not be used as a sole basis for treatment. Nasal washings and aspirates are unacceptable for Xpert Xpress SARS-CoV-2/FLU/RSV testing.  Fact Sheet for Patients: BloggerCourse.com  Fact Sheet for Healthcare Providers: SeriousBroker.it  This test is not yet approved or cleared by the [place]  FDA and has been authorized for detection and/or diagnosis of SARS-CoV-2 by FDA under an Emergency Use Authorization (EUA). This EUA will remain in effect (meaning this test can be used) for the duration of the COVID-19 declaration under Section 564(b)(1) of the Act, 21 U.S.C. section 360bbb-3(b)(1), unless the authorization is terminated or revoked.     Resp Syncytial Virus by PCR NEGATIVE NEGATIVE Final    Comment: (NOTE) Fact Sheet for Patients: BloggerCourse.com  Fact Sheet for Healthcare Providers: SeriousBroker.it  This test is not yet approved or cleared by the [place]  FDA and has been authorized for detection and/or diagnosis of SARS-CoV-2 by FDA under an Emergency Use Authorization (EUA). This EUA will remain in effect (meaning this test can be used) for the duration of the COVID-19 declaration under Section 564(b)(1) of the Act, 21 U.S.C. section 360bbb-3(b)(1), unless the authorization is terminated or revoked.  Performed at [facility], [address] [place], [place] [address]     Coagulation Studies: No results for input(s): LABPROT, INR in the last 72 hours.  Urinalysis: Recent Labs    12/06/23 0810  COLORURINE STRAW*  LABSPEC 1.008  PHURINE 8.0  GLUCOSEU NEGATIVE  HGBUR NEGATIVE   BILIRUBINUR NEGATIVE  KETONESUR NEGATIVE  PROTEINUR 30*  NITRITE NEGATIVE  LEUKOCYTESUR NEGATIVE      Imaging: DG Chest 1 View Result Date: 12/06/2023 CLINICAL DATA:  Shortness of breath. Dialysis was last Friday. Dizziness and hypertension. EXAM: CHEST  1 VIEW COMPARISON:  11/27/2023 FINDINGS: Right chest wall dialysis catheter is noted with tips at the superior cavoatrial junction. Cardiac enlargement. Aortic atherosclerotic calcifications. Pulmonary vascular congestion. No frank edema or pleural effusion. No airspace consolidation. Visualized osseous structures are unremarkable. IMPRESSION: Cardiac enlargement and pulmonary vascular congestion. Electronically Signed   By: Waddell Calk M.D.   On: 12/06/2023 07:16     Medications:     acetaminophen   500 mg Oral TID   amLODipine   10 mg Oral Daily   aspirin  EC  81 mg Oral Daily   atorvastatin   40 mg Oral Daily   calcium  carbonate  400 mg of elemental calcium  Oral BID WC   Chlorhexidine  Gluconate Cloth  6 each Topical Q0600   cinacalcet   60 mg Oral Q supper   [START ON 12/11/2023] cloNIDine   0.3 mg Transdermal Weekly   diclofenac  Sodium  2 g Topical BID   furosemide   80 mg Oral Daily   gabapentin   200 mg Oral TID   heparin   5,000 Units Subcutaneous Q8H   hydrALAZINE   100 mg Oral TID  irbesartan   300 mg Oral QPM   isosorbide  mononitrate  120 mg Oral Daily   melatonin  5 mg Oral QHS   metoprolol  tartrate  50 mg Oral BID   pantoprazole   40 mg Oral Daily   sevelamer  carbonate  1,600 mg Oral TID WC   terazosin   2 mg Oral BID   acetaminophen  **OR** acetaminophen , bisacodyl , fluticasone , hydrOXYzine , polyethylene glycol, senna-docusate  Assessment/ Plan:  Allison Hill is a 53 y.o.  female with end stage renal disease on hemodialysis, hypertension, congestive heart failure, migraines, intracranial hemorrhage, cocaine abuse and sickle cell trait who is admitted to [facility] on 12/06/2023 for Hypertensive emergency [I16.1]  End  stage renal disease: MTWF dialysis (four times a week) at [facility]. Followed by [facility].  - Receiving dialysis today, UF 2L as tolerated.  - Next treatment scheduled for Wednesday - Due to cramping, UF reduced to 1.5L.   Hypertension with chronic kidney disease: urgency on admission with acute exacerbation of congestive heart failure - resumed irbesartan , furosemide , metoprolol , amlodipine , and terazosin  - Blood pressure 169/105  Anemia with chronic kidney disease and sickle cell trait - ESA with HD treatments - Hgb 7.8, will avoid ESA due to hypertension.   Secondary Hyperparathyroidism:  - cinacalcet  - sevelamer  with meals.    LOS: 1 Melvern Ramone 7/7/20254:01 PM

## 2023-12-07 NOTE — Assessment & Plan Note (Addendum)
 Going for her routine dialysis today. Patient was counseled regarding the importance of completing full dialysis sessions each time as she has a tendency to sign her off early without removing all of the required fluid. - Nephrology is on board and managing dialysis - Continue Renvela 

## 2023-12-07 NOTE — Progress Notes (Signed)
 Heart Failure Navigator Progress Note  Assessed for Heart & Vascular TOC clinic readiness.  Patient does not meet criteria due to ESRD on hemodialysis.   Navigator will sign off at this time.  Roxy Horseman, RN, BSN [facility] Heart Failure Navigator Secure Chat Only

## 2023-12-08 ENCOUNTER — Inpatient Hospital Stay
Admit: 2023-12-08 | Discharge: 2023-12-08 | Disposition: A | Discharge status: Home / Self Care | Attending: Internal Medicine | Admitting: Internal Medicine

## 2023-12-08 DIAGNOSIS — I160 Hypertensive urgency: Secondary | ICD-10-CM | POA: Diagnosis not present

## 2023-12-08 DIAGNOSIS — N186 End stage renal disease: Secondary | ICD-10-CM | POA: Diagnosis not present

## 2023-12-08 DIAGNOSIS — I5032 Chronic diastolic (congestive) heart failure: Secondary | ICD-10-CM | POA: Diagnosis not present

## 2023-12-08 DIAGNOSIS — I5021 Acute systolic (congestive) heart failure: Secondary | ICD-10-CM | POA: Diagnosis not present

## 2023-12-08 DIAGNOSIS — J810 Acute pulmonary edema: Secondary | ICD-10-CM | POA: Diagnosis not present

## 2023-12-08 LAB — ECHOCARDIOGRAM COMPLETE
AR max vel: 2.25 cm2
AV Area VTI: 2.18 cm2
AV Area mean vel: 2.11 cm2
AV Mean grad: 4 mmHg
AV Peak grad: 7.1 mmHg
Ao pk vel: 1.33 m/s
Area-P 1/2: 4.21 cm2
MV VTI: 1.92 cm2
S' Lateral: 3.46 cm
Weight: 3146.41 [oz_av]

## 2023-12-08 LAB — HEPATITIS B SURFACE ANTIBODY, QUANTITATIVE: Hep B S AB Quant (Post): 12.4 m[IU]/mL

## 2023-12-08 MED ORDER — HYDRALAZINE HCL 20 MG/ML IJ SOLN: 10.0000 mg | Freq: Four times a day (QID) | INTRAMUSCULAR | Status: DC | PRN

## 2023-12-08 NOTE — Assessment & Plan Note (Signed)
 Patient completed a full session of dialysis yesterday Patient was counseled regarding the importance of completing full dialysis sessions each time as she has a tendency to sign her off early without removing all of the required fluid. - Nephrology is on board and managing dialysis - Continue Renvela 

## 2023-12-08 NOTE — Assessment & Plan Note (Signed)
 Patient admitted with significantly elevated blood pressure above 200 systolic, chest x-ray with concern of some pulmonary vascular congestion.  Mildly elevated troponin with a flat curve.  Patient was on multiple antihypertensives which include amlodipine , hydralazine , clonidine , irbesartan , Imdur  and metoprolol . Per patient she was not getting all of her blood pressure medications at her facility which need to be verified.  Per patient she was only getting hydralazine .  Blood pressure improved but remained elevated  - Continue current medications and need to be addressed the importance of taking it regularly with facility.

## 2023-12-08 NOTE — Care Management Important Message (Signed)
 Important Message  Patient Details  Name: Allison Hill MRN: [id number] Date of Birth: Nov 28, 1970   Important Message Given:  Yes - Medicare IM     [name] 12/08/2023, 12:16 PM

## 2023-12-08 NOTE — Plan of Care (Signed)
  Problem: Clinical Measurements: Goal: Will remain free from infection Outcome: Progressing Goal: Diagnostic test results will improve Outcome: Progressing Goal: Respiratory complications will improve Outcome: Progressing Goal: Cardiovascular complication will be avoided Outcome: Progressing   Problem: Nutrition: Goal: Adequate nutrition will be maintained Outcome: Progressing   Problem: Coping: Goal: Level of anxiety will decrease Outcome: Progressing

## 2023-12-08 NOTE — Assessment & Plan Note (Signed)
 Chest x-ray concerning for some pulmonary vascular congestion which improved with IV Lasix .  Patient still making some urine.  Per nephrology patient has a tendency to sign her off early without completing the dialysis most of the time which need to be addressed.  - Nephrology addressed her complaint of cramping during dialysis as outpatient  - Continue with Lasix  - Received dialysis yesterday -Pending echocardiogram results

## 2023-12-08 NOTE — Assessment & Plan Note (Signed)
 Although BNP elevated but patient is a dialysis patient.  Currently on room air.  Initial imaging concerning for pulmonary edema. Repeat echocardiogram ordered-pending results -Continue to monitor

## 2023-12-08 NOTE — TOC Progression Note (Signed)
 Transition of Care [facility]) - Progression Note    Patient Details  Name: Allison Hill MRN: [id number] Date of Birth: 01-25-1971  [facility] CM/SW Contact  Tomasa JAYSON Childes, RN Phone Number: 12/08/2023, 3:17 PM  Clinical Narrative:    Patient request to speak with this RNCM.  Met with patient at the bedside.  Patient is requesting to to return to facility today.  She was advised that decision for discharge is determined by the MD.   Per conversation with MD patient will likely return to facility tomorrow.  3:23pm Left message with [name]. AC at [employer] advising of patient's likely return to facility.          Expected Discharge Plan and Services                                               Social Determinants of Health (SDOH) Interventions SDOH Screenings   Food Insecurity: No Food Insecurity (12/07/2023)  Housing: Low Risk  (12/07/2023)  Transportation Needs: No Transportation Needs (12/07/2023)  Utilities: Patient Declined (12/07/2023)  Financial Resource Strain: Patient Declined (01/03/2023)   Received from [facility]  Social Connections: Socially Isolated (11/10/2023)  Tobacco Use: High Risk (11/26/2023)    Readmission Risk Interventions     No data to display

## 2023-12-08 NOTE — Progress Notes (Signed)
 Progress Note   Patient: Allison Hill FMW:[id number] DOB: 1971-02-20 DOA: 12/06/2023     2 DOS: the patient was seen and examined on 12/08/2023   Brief hospital course: Taken from H&P.   Mashal Slavick is a 53 y.o. female with medical history significant of ESRD on HD MTWF, polysubstance abuse (cocaine most recently), HFpEF, hypertension, iron deficiency anemia. She presented to the [facility] ED with complaints of chest pain and shortness of breath.    Patient was found to have significantly elevated blood pressure at 221/125, chest x-ray concerning for pulmonary vascular congestion.  Patient was given IV Lasix  80 mg and nephrology was consulted for dialysis.  7/7: Blood pressure remained elevated at 203/125, Troponin mildly elevated with a flat curve.  BNP of 2085.  UA and UDS was negative. Echocardiogram ordered-pending Per patient she was not getting all of her blood pressure medications at her facility and they were only giving her hydralazine .  Medication need to be confirmed with facility-asked TOC.  Nephrology could be very specific about what need to be given before and after dialysis on dialysis days.  Patient also does not complete her full session of dialysis most of the time per nephrology, counseling was provided as she need to complete full sessions and stop signing her out. Going for dialysis today.  7/8: Blood pressure remained elevated, discussed with nephrology and they were recommending outpatient follow-up as it is improved than before.  Patient with concern of cramping during dialysis as outpatient, nephrology will address. Echocardiogram done-pending results  Assessment and Plan: * Hypertensive urgency Patient admitted with significantly elevated blood pressure above 200 systolic, chest x-ray with concern of some pulmonary vascular congestion.  Mildly elevated troponin with a flat curve.  Patient was on multiple antihypertensives which include amlodipine , hydralazine ,  clonidine , irbesartan , Imdur  and metoprolol . Per patient she was not getting all of her blood pressure medications at her facility which need to be verified.  Per patient she was only getting hydralazine .  Blood pressure improved but remained elevated  - Continue current medications and need to be addressed the importance of taking it regularly with facility.  Pulmonary edema Chest x-ray concerning for some pulmonary vascular congestion which improved with IV Lasix .  Patient still making some urine.  Per nephrology patient has a tendency to sign her off early without completing the dialysis most of the time which need to be addressed.  - Nephrology addressed her complaint of cramping during dialysis as outpatient  - Continue with Lasix  - Received dialysis yesterday -Pending echocardiogram results  Chronic heart failure with preserved ejection fraction (HFpEF) (HCC) Although BNP elevated but patient is a dialysis patient.  Currently on room air.  Initial imaging concerning for pulmonary edema. Repeat echocardiogram ordered-pending results -Continue to monitor  ESRD on dialysis [facility]) Patient completed a full session of dialysis yesterday Patient was counseled regarding the importance of completing full dialysis sessions each time as she has a tendency to sign her off early without removing all of the required fluid. - Nephrology is on board and managing dialysis - Continue Renvela   Cocaine use Per patient she has not used cocaine for a year now.  UDS was negative   Subjective: Patient was seen and examined today.  No new concern.  She was complaining of having a lot of cramps during dialysis as outpatient which cause her to sign off her treatment early.  There was no cramps yesterday during dialysis in the hospital.  Physical Exam: Vitals:   12/08/23  0305 12/08/23 0752 12/08/23 1134 12/08/23 1146  BP: (!) 148/95 (!) 188/108 (!) 164/96   Pulse: 73 72 75   Resp: 16 16 18    Temp:  98.3 F (36.8 C) 98.1 F (36.7 C) (!) 97.3 F (36.3 C)   TempSrc:  Oral Oral   SpO2: 100% 95% 95%   Weight:    79.2 kg   General.  Well-developed lady, in no acute distress. Pulmonary.  Lungs clear bilaterally, normal respiratory effort. CV.  Regular rate and rhythm, no JVD, rub or murmur. Abdomen.  Soft, nontender, nondistended, BS positive. CNS.  Alert and oriented .  No focal neurologic deficit. Extremities.  No edema, no cyanosis, pulses intact and symmetrical. Psychiatry.  Judgment and insight appears normal.   Data Reviewed: Prior data reviewed  Family Communication: Discussed with patient  Disposition: Status is: Inpatient Remains inpatient appropriate because: Severity of illness  Planned Discharge Destination: Skilled nursing facility  DVT prophylaxis.  Subcu heparin  Time spent: 50 minutes  This record has been created using Conservation officer, historic buildings. Errors have been sought and corrected,but may not always be located. Such creation errors do not reflect on the standard of care.   Author: Amaryllis Dare, MD 12/08/2023 1:37 PM  For on call review [email or web address].

## 2023-12-08 NOTE — Progress Notes (Addendum)
 [facility]  ROUNDING NOTE   Subjective:   [name] was admitted to [facility] on 12/06/2023 for Hypertensive emergency [I16.1]  Last hemodialysis treatment was Friday.   Update: Patient seen sitting at side of bed Alert Experienced cramping with HD yesterday, denies currently   Objective:  Vital signs in last 24 hours:  Temp:  [97.3 F (36.3 C)-98.6 F (37 C)] 98.1 F (36.7 C) (07/08 1427) Pulse Rate:  [72-84] 75 (07/08 1427) Resp:  [13-22] 16 (07/08 1427) BP: (134-191)/(78-121) 134/78 (07/08 1427) SpO2:  [94 %-100 %] 100 % (07/08 1427) Weight:  [79.2 kg-89.2 kg] 79.2 kg (07/08 1146)  Weight change: -2 kg Filed Weights   12/07/23 1345 12/07/23 1703 12/08/23 1146  Weight: 90.7 kg 89.2 kg 79.2 kg    Intake/Output: I/O last 3 completed shifts: In: 360 [P.O.:360] Out: 1800 [Urine:300; Other:1500]   Intake/Output this shift:  Total I/O In: 120 [P.O.:120] Out: -   Physical Exam: General: NAD, laying on stretcher  Head: Normocephalic, atraumatic. Moist oral mucosal membranes  Eyes: Anicteric  Lungs:  Clear to auscultation, room air  Heart: Regular rate and rhythm  Abdomen:  Soft, nontender  Extremities:  trace peripheral edema.  Neurologic: Alert, awake  Skin: No lesions  Access: RIJ permcath    Basic Metabolic Panel: Recent Labs  Lab 12/06/23 0735 12/07/23 0028  NA 140 140  K 4.1 4.3  CL 101 104  CO2 26 23  GLUCOSE 98 96  BUN 72* 85*  CREATININE 6.88* 7.29*  CALCIUM  8.8* 8.1*    Liver Function Tests: Recent Labs  Lab 12/06/23 0735  AST 16  ALT 8  ALKPHOS 350*  BILITOT 0.7  PROT 7.1  ALBUMIN 3.6   No results for input(s): LIPASE, AMYLASE in the last 168 hours. No results for input(s): AMMONIA in the last 168 hours.  CBC: Recent Labs  Lab 12/06/23 0735 12/07/23 0028  WBC 6.8 7.0  NEUTROABS 4.5  --   HGB 7.9* 7.8*  HCT 24.9* 24.1*  MCV 84.7 82.8  PLT 173 151    Cardiac Enzymes: No results for input(s):  CKTOTAL, CKMB, CKMBINDEX, TROPONINI in the last 168 hours.  BNP: Invalid input(s): POCBNP  CBG: No results for input(s): GLUCAP in the last 168 hours.  Microbiology: Results for orders placed or performed during the hospital encounter of 12/06/23  Resp panel by RT-PCR (RSV, Flu A&B, Covid) Anterior Nasal Swab     Status: None   Collection Time: 12/06/23  7:00 AM   Specimen: Anterior Nasal Swab  Result Value Ref Range Status   SARS Coronavirus 2 by RT PCR NEGATIVE NEGATIVE Final    Comment: (NOTE) SARS-CoV-2 target nucleic acids are NOT DETECTED.  The SARS-CoV-2 RNA is generally detectable in upper respiratory specimens during the acute phase of infection. The lowest concentration of SARS-CoV-2 viral copies this assay can detect is 138 copies/mL. A negative result does not preclude SARS-Cov-2 infection and should not be used as the sole basis for treatment or other patient management decisions. A negative result may occur with  improper specimen collection/handling, submission of specimen other than nasopharyngeal swab, presence of viral mutation(s) within the areas targeted by this assay, and inadequate number of viral copies(<138 copies/mL). A negative result must be combined with clinical observations, patient history, and epidemiological information. The expected result is Negative.  Fact Sheet for Patients:  BloggerCourse.com  Fact Sheet for Healthcare Providers:  SeriousBroker.it  This test is no t yet approved or cleared by  the [place]  FDA and  has been authorized for detection and/or diagnosis of SARS-CoV-2 by FDA under an Emergency Use Authorization (EUA). This EUA will remain  in effect (meaning this test can be used) for the duration of the COVID-19 declaration under Section 564(b)(1) of the Act, 21 U.S.C.section 360bbb-3(b)(1), unless the authorization is terminated  or revoked sooner.        Influenza A by PCR NEGATIVE NEGATIVE Final   Influenza B by PCR NEGATIVE NEGATIVE Final    Comment: (NOTE) The Xpert Xpress SARS-CoV-2/FLU/RSV plus assay is intended as an aid in the diagnosis of influenza from Nasopharyngeal swab specimens and should not be used as a sole basis for treatment. Nasal washings and aspirates are unacceptable for Xpert Xpress SARS-CoV-2/FLU/RSV testing.  Fact Sheet for Patients: BloggerCourse.com  Fact Sheet for Healthcare Providers: SeriousBroker.it  This test is not yet approved or cleared by the [place]  FDA and has been authorized for detection and/or diagnosis of SARS-CoV-2 by FDA under an Emergency Use Authorization (EUA). This EUA will remain in effect (meaning this test can be used) for the duration of the COVID-19 declaration under Section 564(b)(1) of the Act, 21 U.S.C. section 360bbb-3(b)(1), unless the authorization is terminated or revoked.     Resp Syncytial Virus by PCR NEGATIVE NEGATIVE Final    Comment: (NOTE) Fact Sheet for Patients: BloggerCourse.com  Fact Sheet for Healthcare Providers: SeriousBroker.it  This test is not yet approved or cleared by the [place]  FDA and has been authorized for detection and/or diagnosis of SARS-CoV-2 by FDA under an Emergency Use Authorization (EUA). This EUA will remain in effect (meaning this test can be used) for the duration of the COVID-19 declaration under Section 564(b)(1) of the Act, 21 U.S.C. section 360bbb-3(b)(1), unless the authorization is terminated or revoked.  Performed at [facility], [address] [place], [place] [address]     Coagulation Studies: No results for input(s): LABPROT, INR in the last 72 hours.  Urinalysis: Recent Labs    12/06/23 0810  COLORURINE STRAW*  LABSPEC 1.008  PHURINE 8.0  GLUCOSEU NEGATIVE  HGBUR NEGATIVE   BILIRUBINUR NEGATIVE  KETONESUR NEGATIVE  PROTEINUR 30*  NITRITE NEGATIVE  LEUKOCYTESUR NEGATIVE      Imaging: ECHOCARDIOGRAM COMPLETE Result Date: 12/08/2023    ECHOCARDIOGRAM REPORT   Patient Name:   Allison Hill Date of Exam: 12/08/2023 Medical Rec #:  [id number]        Height:       63.0 in Accession #:    [id number]       Weight:       196.6 lb Date of Birth:  February 14, 1971        BSA:          1.920 m Patient Age:    53 years         BP:           149/95 mmHg Patient Gender: F                HR:           73 bpm. Exam Location:  [facility] Procedure: 2D Echo, Color Doppler and Cardiac Doppler (Both Spectral and Color            Flow Doppler were utilized during procedure). Indications:     CHF-acute systolic I50.21  History:         Patient has no prior history of Echocardiogram examinations.  CHF; Risk Factors:Hypertension.  Sonographer:     Christopher Furnace Referring Phys:  [id number] AVA SWAYZE Diagnosing Phys: Deatrice Cage MD IMPRESSIONS  1. Left ventricular ejection fraction, by estimation, is 55 to 60%. The left ventricle has normal function. The left ventricle has no regional wall motion abnormalities. There is moderate left ventricular hypertrophy. Left ventricular diastolic parameters are indeterminate.  2. Right ventricular systolic function is normal. The right ventricular size is normal. Tricuspid regurgitation signal is inadequate for assessing PA pressure.  3. Left atrial size was moderately dilated.  4. The mitral valve is normal in structure. Moderate mitral valve regurgitation. No evidence of mitral stenosis.  5. The aortic valve is normal in structure. Aortic valve regurgitation is trivial. No aortic stenosis is present.  6. The inferior vena cava is normal in size with greater than 50% respiratory variability, suggesting right atrial pressure of 3 mmHg. FINDINGS  Left Ventricle: Left ventricular ejection fraction, by estimation, is 55 to 60%. The left ventricle has normal function.  The left ventricle has no regional wall motion abnormalities. The left ventricular internal cavity size was normal in size. There is  moderate left ventricular hypertrophy. Left ventricular diastolic parameters are indeterminate. Right Ventricle: The right ventricular size is normal. No increase in right ventricular wall thickness. Right ventricular systolic function is normal. Tricuspid regurgitation signal is inadequate for assessing PA pressure. The tricuspid regurgitant velocity is 2.20 m/s, and with an assumed right atrial pressure of 3 mmHg, the estimated right ventricular systolic pressure is 22.4 mmHg. Left Atrium: Left atrial size was moderately dilated. Right Atrium: Right atrial size was normal in size. Pericardium: There is no evidence of pericardial effusion. Mitral Valve: The mitral valve is normal in structure. Moderate mitral valve regurgitation. No evidence of mitral valve stenosis. MV peak gradient, 7.7 mmHg. The mean mitral valve gradient is 3.0 mmHg. Tricuspid Valve: The tricuspid valve is normal in structure. Tricuspid valve regurgitation is trivial. No evidence of tricuspid stenosis. Aortic Valve: The aortic valve is normal in structure. Aortic valve regurgitation is trivial. No aortic stenosis is present. Aortic valve mean gradient measures 4.0 mmHg. Aortic valve peak gradient measures 7.1 mmHg. Aortic valve area, by VTI measures 2.18 cm. Pulmonic Valve: The pulmonic valve was normal in structure. Pulmonic valve regurgitation is not visualized. No evidence of pulmonic stenosis. Aorta: The aortic root is normal in size and structure. Venous: The inferior vena cava is normal in size with greater than 50% respiratory variability, suggesting right atrial pressure of 3 mmHg. IAS/Shunts: No atrial level shunt detected by color flow Doppler.  LEFT VENTRICLE PLAX 2D LVIDd:         4.73 cm   Diastology LVIDs:         3.46 cm   LV e' medial:    10.20 cm/s LV PW:         2.32 cm   LV E/e' medial:  10.4  LV IVS:        1.65 cm   LV e' lateral:   8.61 cm/s LVOT diam:     2.10 cm   LV E/e' lateral: 12.3 LV SV:         64 LV SV Index:   33 LVOT Area:     3.46 cm  RIGHT VENTRICLE RV Basal diam:  4.30 cm RV Mid diam:    3.14 cm LEFT ATRIUM              Index  RIGHT ATRIUM           Index LA diam:        4.20 cm  2.19 cm/m   RA Area:     13.90 cm LA Vol (A2C):   170.0 ml 88.55 ml/m  RA Volume:   33.30 ml  17.35 ml/m LA Vol (A4C):   102.0 ml 53.13 ml/m LA Biplane Vol: 138.0 ml 71.88 ml/m  AORTIC VALVE AV Area (Vmax):    2.25 cm AV Area (Vmean):   2.11 cm AV Area (VTI):     2.18 cm AV Vmax:           133.00 cm/s AV Vmean:          97.700 cm/s AV VTI:            0.293 m AV Peak Grad:      7.1 mmHg AV Mean Grad:      4.0 mmHg LVOT Vmax:         86.50 cm/s LVOT Vmean:        59.400 cm/s LVOT VTI:          0.184 m LVOT/AV VTI ratio: 0.63  AORTA Ao Root diam: 3.10 cm MITRAL VALVE                TRICUSPID VALVE MV Area (PHT): 4.21 cm     TR Peak grad:   19.4 mmHg MV Area VTI:   1.92 cm     TR Vmax:        220.00 cm/s MV Peak grad:  7.7 mmHg MV Mean grad:  3.0 mmHg     SHUNTS MV Vmax:       1.39 m/s     Systemic VTI:  0.18 m MV Vmean:      78.0 cm/s    Systemic Diam: 2.10 cm MV Decel Time: 180 msec MV E velocity: 106.00 cm/s MV A velocity: 62.10 cm/s MV E/A ratio:  1.71 Deatrice Cage MD Electronically signed by Deatrice Cage MD Signature Date/Time: 12/08/2023/1:57:48 PM    Final      Medications:     acetaminophen   500 mg Oral TID   amLODipine   10 mg Oral Daily   aspirin  EC  81 mg Oral Daily   atorvastatin   40 mg Oral Daily   calcium  carbonate  400 mg of elemental calcium  Oral BID WC   Chlorhexidine  Gluconate Cloth  6 each Topical Q0600   cinacalcet   60 mg Oral Q supper   [START ON 12/11/2023] cloNIDine   0.3 mg Transdermal Weekly   diclofenac  Sodium  2 g Topical BID   furosemide   80 mg Oral Daily   gabapentin   200 mg Oral TID   heparin   5,000 Units Subcutaneous Q8H   hydrALAZINE   100 mg Oral TID    irbesartan   300 mg Oral QPM   isosorbide  mononitrate  120 mg Oral Daily   melatonin  5 mg Oral QHS   metoprolol  tartrate  50 mg Oral BID   pantoprazole   40 mg Oral Daily   sevelamer  carbonate  1,600 mg Oral TID WC   terazosin   2 mg Oral BID   acetaminophen  **OR** acetaminophen , bisacodyl , fluticasone , hydrALAZINE , hydrOXYzine , polyethylene glycol, senna-docusate  Assessment/ Plan:  [name] is a 53 y.o.  female with end stage renal disease on hemodialysis, hypertension, congestive heart failure, migraines, intracranial hemorrhage, cocaine abuse and sickle cell trait who is admitted to [facility] on 12/06/2023 for Hypertensive emergency [I16.1]  End stage renal  disease: MTWF dialysis (four times a week) at [facility]. Followed by [facility].  - Dialysis received yesterday, UF 2L achieved. - Next treatment scheduled for Wednesday - Will adjust dry weighty after treatment tomorrow  Hypertension with chronic kidney disease: urgency on admission with acute exacerbation of congestive heart failure - resumed clonidine , hydralazine , isosorbide ,  irbesartan , furosemide , metoprolol , amlodipine , and terazosin  - Blood pressure elevated this morning, 188/108  Anemia with chronic kidney disease and sickle cell trait - ESA with HD treatments - Hgb 7.8 at last check  Secondary Hyperparathyroidism:  - cinacalcet  - sevelamer  with meals.    LOS: 2 Abed Schar 7/8/20252:32 PM

## 2023-12-08 NOTE — Progress Notes (Signed)
*  PRELIMINARY RESULTS* Echocardiogram 2D Echocardiogram has been performed.  Allison Hill 12/08/2023, 8:42 AM

## 2023-12-09 DIAGNOSIS — I160 Hypertensive urgency: Secondary | ICD-10-CM | POA: Diagnosis not present

## 2023-12-09 DIAGNOSIS — J810 Acute pulmonary edema: Secondary | ICD-10-CM | POA: Diagnosis not present

## 2023-12-09 DIAGNOSIS — N186 End stage renal disease: Secondary | ICD-10-CM | POA: Diagnosis not present

## 2023-12-09 DIAGNOSIS — I5032 Chronic diastolic (congestive) heart failure: Secondary | ICD-10-CM | POA: Diagnosis not present

## 2023-12-09 MED ORDER — CALCITRIOL 0.25 MCG PO CAPS: 0.2500 ug | ORAL_CAPSULE | ORAL | Status: AC

## 2023-12-09 NOTE — Care Management CC44 (Signed)
 Condition Code 44 Documentation Completed  Patient Details  Name: Allison Hill MRN: [id number] Date of Birth: 1970/08/17   Condition Code 44 given:  Yes Patient signature on Condition Code 44 notice:  Yes Documentation of 2 MD's agreement:  Yes Code 44 added to claim:  Yes    Ericka Marcellus C Destony Prevost, RN 12/09/2023, 4:06 PM

## 2023-12-09 NOTE — Plan of Care (Signed)
  Problem: Health Behavior/Discharge Planning: Goal: Ability to manage health-related needs will improve Outcome: Progressing   Problem: Clinical Measurements: Goal: Ability to maintain clinical measurements within normal limits will improve Outcome: Progressing Goal: Respiratory complications will improve Outcome: Progressing Goal: Cardiovascular complication will be avoided Outcome: Progressing   Problem: Safety: Goal: Ability to remain free from injury will improve Outcome: Progressing

## 2023-12-09 NOTE — Discharge Summary (Signed)
 Physician Discharge Summary  Allison Hill FMW:[id number] DOB: 1970-09-07 DOA: 12/06/2023  PCP: [facility] [address]  Admit date: 12/06/2023 Discharge date: 12/09/2023   Recommendations for Outpatient Follow-up:  Follow up with PCP in 1-2  weeks for chronic medication management   Hospital Course: Allison Hill is a 53 year old female with ESRD on hemodialysis MTWF, polysubstance abuse that she has not used in >1 year, heart failure preserved EF, hypertension, iron deficiency anemia who presented to [facility] with complaints of shortness of breath.  On arrival BP was found to be 221/125.  CXR concerning for pulmonary vascular congestion.  Patient was given IV Lasix  80 mg and nephrology was consulted for dialysis.  Upon further discussion patient believes that she is not receiving all of her BP meds at her facility.  All home meds were resumed and patient received dialysis as scheduled.  She was also counseled on the importance of completing dialysis sessions outpatient.  After the resumption of all home meds blood pressure began to resolve.  Echocardiogram was performed which revealed preserved EF, moderate left ventricular hypertrophy and indeterminate diastolic parameters.  It did note moderate mitral valve regurg and trivial aortic valve regurg. By 7/9 patient endorsed feeling back to baseline and was requesting discharge home.  I consulted with TOC who confirms that patient can return to [facility] today.  Hypertensive urgency HTN - Appears patient is not receiving blood pressure medications consistently at home. - Have now resumed all home meds and BP is much better controlled - I reiterated importance of close medication adherence at discharge  Pulmonary edema - Stable on room air - Volume overload likely secondary to dialysis.  Echo with preserved EF.  Status post Lasix .  ESRD - MTWF HD at [facility] - Outpatient hemodialysis established - HD per nephro while admitted.  Received dialysis prior  to discharge today  Secondary hyperparathyroidism - Continue cinacalcet  and sevelamer    Chronic anemia secondary to CKD, sickle cell trait, iron deficiency - ESA with HD treatments per nephro - Hemoglobin currently stable  Prior cocaine use - Patient reports she has not used cocaine for over a year.  UDS negative   Discharge Instructions  Discharge Instructions     Call MD for:  difficulty breathing, headache or visual disturbances   Complete by: As directed    Call MD for:  persistant dizziness or light-headedness   Complete by: As directed    Call MD for:  persistant nausea and vomiting   Complete by: As directed    Call MD for:  severe uncontrolled pain   Complete by: As directed    Call MD for:  temperature >100.4   Complete by: As directed    Diet general   Complete by: As directed    Discharge instructions   Complete by: As directed    Be sure to take all antihypertensives as prescribed, do not deviate from schedule.   Increase activity slowly   Complete by: As directed       Allergies as of 12/09/2023       Reactions   Ceftriaxone Shortness Of Breath, Hives   Allergy, hives   Shellfish Allergy Hives   Minoxidil Swelling        Medication List     TAKE these medications    acetaminophen  500 MG tablet Commonly known as: TYLENOL  Take 500 mg by mouth 3 (three) times daily.   amLODipine  10 MG tablet Commonly known as: NORVASC  Take 10 mg by mouth daily.   aspirin  EC  81 MG tablet Take 81 mg by mouth daily.   atorvastatin  40 MG tablet Commonly known as: LIPITOR Take 40 mg by mouth daily.   calcitRIOL  0.25 MCG capsule Commonly known as: ROCALTROL  Take 1 capsule (0.25 mcg total) by mouth 4 (four) times a week. Take one capsule by mouth every Mon, Tue, Wed, Fri with dialysis Start taking on: December 10, 2023   calcium  carbonate 500 MG chewable tablet Commonly known as: TUMS - dosed in mg elemental calcium  Chew 2 tablets (400 mg of elemental calcium   total) by mouth 2 (two) times daily.   cinacalcet  60 MG tablet Commonly known as: SENSIPAR  Take 60 mg by mouth daily with supper.   cloNIDine  0.1 MG tablet Commonly known as: CATAPRES  Take 0.1 mg by mouth 2 (two) times daily as needed (Diastolic BP greater than 100 with manual BP).   cloNIDine  0.3 mg/24hr patch Commonly known as: CATAPRES  - Dosed in mg/24 hr Place 1 patch onto the skin once a week. On Fridays   epoetin  alfa 4000 UNIT/ML injection Commonly known as: EPOGEN  Inject 1 mL (4,000 Units total) into the vein Every Tuesday,Thursday,and Saturday with dialysis. What changed:  when to take this additional instructions   fluticasone  50 MCG/ACT nasal spray Commonly known as: FLONASE  Place 1 spray into both nostrils daily as needed for allergies or rhinitis.   furosemide  80 MG tablet Commonly known as: Lasix  Take 1 tablet (80 mg total) by mouth daily as needed for edema or fluid (hypertension).   gabapentin  100 MG capsule Commonly known as: NEURONTIN  Take 200 mg by mouth 3 (three) times daily.   hydrALAZINE  100 MG tablet Commonly known as: APRESOLINE  Take 100 mg by mouth 3 (three) times daily.   irbesartan  300 MG tablet Commonly known as: AVAPRO  Take 1 tablet (300 mg total) by mouth every evening.   isosorbide  mononitrate 120 MG 24 hr tablet Commonly known as: IMDUR  Take 120 mg by mouth daily.   meclizine  25 MG tablet Commonly known as: ANTIVERT  Take 1 tablet (25 mg total) by mouth 2 (two) times daily as needed for dizziness.   melatonin 3 MG Tabs tablet Take 6 mg by mouth at bedtime.   metoprolol  tartrate 50 MG tablet Commonly known as: LOPRESSOR  Take 50 mg by mouth 2 (two) times daily.   nitroGLYCERIN 0.4 MG SL tablet Commonly known as: NITROSTAT Place 0.4 mg under the tongue every 5 (five) minutes as needed for chest pain.   pantoprazole  40 MG tablet Commonly known as: PROTONIX  Take 1 tablet (40 mg total) by mouth daily.   polyethylene glycol 17 g  packet Commonly known as: MIRALAX  / GLYCOLAX  Take 17 g by mouth daily as needed.   senna-docusate 8.6-50 MG tablet Commonly known as: Senokot-S Take 2 tablets by mouth 2 (two) times daily as needed for mild constipation.   sevelamer  carbonate 800 MG tablet Commonly known as: RENVELA  Take 1,600 mg by mouth 3 (three) times daily.   terazosin  2 MG capsule Commonly known as: HYTRIN  Take 2 mg by mouth 2 (two) times daily.   Voltaren  1 % Gel Generic drug: diclofenac  Sodium Apply 2 g topically 2 (two) times daily. Apply to left shoulder.        Allergies  Allergen Reactions   Ceftriaxone Shortness Of Breath and Hives    Allergy, hives   Shellfish Allergy Hives   Minoxidil Swelling    Consultations:    Procedures/Studies: ECHOCARDIOGRAM COMPLETE Result Date: 12/08/2023    ECHOCARDIOGRAM REPORT   Patient  Name:   Allison Hill Date of Exam: 12/08/2023 Medical Rec #:  [id number]        Height:       63.0 in Accession #:    [id number]       Weight:       196.6 lb Date of Birth:  1970/10/05        BSA:          1.920 m Patient Age:    53 years         BP:           149/95 mmHg Patient Gender: F                HR:           73 bpm. Exam Location:  [facility] Procedure: 2D Echo, Color Doppler and Cardiac Doppler (Both Spectral and Color            Flow Doppler were utilized during procedure). Indications:     CHF-acute systolic I50.21  History:         Patient has no prior history of Echocardiogram examinations.                  CHF; Risk Factors:Hypertension.  Sonographer:     Christopher Furnace Referring Phys:  [id number] AVA SWAYZE Diagnosing Phys: Deatrice Cage MD IMPRESSIONS  1. Left ventricular ejection fraction, by estimation, is 55 to 60%. The left ventricle has normal function. The left ventricle has no regional wall motion abnormalities. There is moderate left ventricular hypertrophy. Left ventricular diastolic parameters are indeterminate.  2. Right ventricular systolic function is normal. The right  ventricular size is normal. Tricuspid regurgitation signal is inadequate for assessing PA pressure.  3. Left atrial size was moderately dilated.  4. The mitral valve is normal in structure. Moderate mitral valve regurgitation. No evidence of mitral stenosis.  5. The aortic valve is normal in structure. Aortic valve regurgitation is trivial. No aortic stenosis is present.  6. The inferior vena cava is normal in size with greater than 50% respiratory variability, suggesting right atrial pressure of 3 mmHg. FINDINGS  Left Ventricle: Left ventricular ejection fraction, by estimation, is 55 to 60%. The left ventricle has normal function. The left ventricle has no regional wall motion abnormalities. The left ventricular internal cavity size was normal in size. There is  moderate left ventricular hypertrophy. Left ventricular diastolic parameters are indeterminate. Right Ventricle: The right ventricular size is normal. No increase in right ventricular wall thickness. Right ventricular systolic function is normal. Tricuspid regurgitation signal is inadequate for assessing PA pressure. The tricuspid regurgitant velocity is 2.20 m/s, and with an assumed right atrial pressure of 3 mmHg, the estimated right ventricular systolic pressure is 22.4 mmHg. Left Atrium: Left atrial size was moderately dilated. Right Atrium: Right atrial size was normal in size. Pericardium: There is no evidence of pericardial effusion. Mitral Valve: The mitral valve is normal in structure. Moderate mitral valve regurgitation. No evidence of mitral valve stenosis. MV peak gradient, 7.7 mmHg. The mean mitral valve gradient is 3.0 mmHg. Tricuspid Valve: The tricuspid valve is normal in structure. Tricuspid valve regurgitation is trivial. No evidence of tricuspid stenosis. Aortic Valve: The aortic valve is normal in structure. Aortic valve regurgitation is trivial. No aortic stenosis is present. Aortic valve mean gradient measures 4.0 mmHg. Aortic valve  peak gradient measures 7.1 mmHg. Aortic valve area, by VTI measures 2.18 cm. Pulmonic Valve: The pulmonic valve was normal in structure. Pulmonic  valve regurgitation is not visualized. No evidence of pulmonic stenosis. Aorta: The aortic root is normal in size and structure. Venous: The inferior vena cava is normal in size with greater than 50% respiratory variability, suggesting right atrial pressure of 3 mmHg. IAS/Shunts: No atrial level shunt detected by color flow Doppler.  LEFT VENTRICLE PLAX 2D LVIDd:         4.73 cm   Diastology LVIDs:         3.46 cm   LV e' medial:    10.20 cm/s LV PW:         2.32 cm   LV E/e' medial:  10.4 LV IVS:        1.65 cm   LV e' lateral:   8.61 cm/s LVOT diam:     2.10 cm   LV E/e' lateral: 12.3 LV SV:         64 LV SV Index:   33 LVOT Area:     3.46 cm  RIGHT VENTRICLE RV Basal diam:  4.30 cm RV Mid diam:    3.14 cm LEFT ATRIUM              Index        RIGHT ATRIUM           Index LA diam:        4.20 cm  2.19 cm/m   RA Area:     13.90 cm LA Vol (A2C):   170.0 ml 88.55 ml/m  RA Volume:   33.30 ml  17.35 ml/m LA Vol (A4C):   102.0 ml 53.13 ml/m LA Biplane Vol: 138.0 ml 71.88 ml/m  AORTIC VALVE AV Area (Vmax):    2.25 cm AV Area (Vmean):   2.11 cm AV Area (VTI):     2.18 cm AV Vmax:           133.00 cm/s AV Vmean:          97.700 cm/s AV VTI:            0.293 m AV Peak Grad:      7.1 mmHg AV Mean Grad:      4.0 mmHg LVOT Vmax:         86.50 cm/s LVOT Vmean:        59.400 cm/s LVOT VTI:          0.184 m LVOT/AV VTI ratio: 0.63  AORTA Ao Root diam: 3.10 cm MITRAL VALVE                TRICUSPID VALVE MV Area (PHT): 4.21 cm     TR Peak grad:   19.4 mmHg MV Area VTI:   1.92 cm     TR Vmax:        220.00 cm/s MV Peak grad:  7.7 mmHg MV Mean grad:  3.0 mmHg     SHUNTS MV Vmax:       1.39 m/s     Systemic VTI:  0.18 m MV Vmean:      78.0 cm/s    Systemic Diam: 2.10 cm MV Decel Time: 180 msec MV E velocity: 106.00 cm/s MV A velocity: 62.10 cm/s MV E/A ratio:  1.71 Deatrice Cage MD Electronically signed by Deatrice Cage MD Signature Date/Time: 12/08/2023/1:57:48 PM    Final    DG Chest 1 View Result Date: 12/06/2023 CLINICAL DATA:  Shortness of breath. Dialysis was last Friday. Dizziness and hypertension. EXAM: CHEST  1 VIEW COMPARISON:  11/27/2023 FINDINGS: Right chest wall  dialysis catheter is noted with tips at the superior cavoatrial junction. Cardiac enlargement. Aortic atherosclerotic calcifications. Pulmonary vascular congestion. No frank edema or pleural effusion. No airspace consolidation. Visualized osseous structures are unremarkable. IMPRESSION: Cardiac enlargement and pulmonary vascular congestion. Electronically Signed   By: Waddell Calk M.D.   On: 12/06/2023 07:16   CT Angio Chest PE W/Cm &/Or Wo Cm Result Date: 11/27/2023 CLINICAL DATA:  Pulmonary embolism (PE) suspected, low to intermediate prob, positive D-dimer EXAM: CT ANGIOGRAPHY CHEST WITH CONTRAST TECHNIQUE: Multidetector CT imaging of the chest was performed using the standard protocol during bolus administration of intravenous contrast. Multiplanar CT image reconstructions and MIPs were obtained to evaluate the vascular anatomy. RADIATION DOSE REDUCTION: This exam was performed according to the departmental dose-optimization program which includes automated exposure control, adjustment of the mA and/or kV according to patient size and/or use of iterative reconstruction technique. CONTRAST:  OMNIPAQUE  IOHEXOL  350 MG/ML SOLN COMPARISON:  Chest x-ray 11/27/2023 FINDINGS: Cardiovascular: Right chest wall dialysis catheter with tip within the right atrium. Satisfactory opacification of the pulmonary arteries to the segmental level. No evidence of pulmonary embolism. Enlarged heart size. No significant pericardial effusion. The thoracic aorta is normal in caliber. Mild-to-moderate atherosclerotic plaque of the thoracic aorta. At least 3 vessel coronary artery calcifications. Mild aortic valve leaflet  calcification. Mediastinum/Nodes: No enlarged mediastinal, hilar, or axillary lymph nodes. Thyroid gland, trachea, and esophagus demonstrate no significant findings. Lungs/Pleura: Bilateral lower lobe atelectasis. No focal consolidation. No pulmonary nodule. No pulmonary mass. No pleural effusion. No pneumothorax. Upper Abdomen: No acute abnormality. Musculoskeletal: No chest wall abnormality. No suspicious lytic or blastic osseous lesions. No acute displaced fracture. Multilevel degenerative changes of the spine. Review of the MIP images confirms the above findings. IMPRESSION: 1. No pulmonary embolism. 2. No acute intrathoracic abnormality. 3. Cardiomegaly. 4. Aortic Atherosclerosis (ICD10-I70.0) including at least 3 vessel coronary calcification and aortic valve leaflet calcifications-correlate for aortic stenosis. Electronically Signed   By: Morgane  Naveau M.D.   On: 11/27/2023 03:26   DG Chest 1 View Result Date: 11/27/2023 CLINICAL DATA:  Chest pain EXAM: CHEST  1 VIEW COMPARISON:  10/24/2023 FINDINGS: Lungs are clear. No pneumothorax or pleural effusion. Right internal jugular hemodialysis catheter tip seen within the right atrium. Stable mild cardiomegaly. Pulmonary vascularity is normal. No acute bone abnormality. IMPRESSION: 1. No active disease. 2. Stable mild cardiomegaly. Electronically Signed   By: Dorethia Molt M.D.   On: 11/27/2023 01:55   MR BRAIN WO CONTRAST Result Date: 11/10/2023 CLINICAL DATA:  Initial evaluation for acute neuro deficit, stroke suspected. EXAM: MRI HEAD WITHOUT CONTRAST TECHNIQUE: Multiplanar, multiecho pulse sequences of the brain and surrounding structures were obtained without intravenous contrast. COMPARISON:  CTs from earlier the same day. FINDINGS: Brain: Cerebral volume within normal limits. Patchy and confluent T2/FLAIR hyperintensity involving the periventricular and deep white matter both cerebral hemispheres as well as the pons, consistent with chronic small  vessel ischemic disease, moderately advanced. Multiple remote lacunar infarcts present about the deep gray nuclei. No abnormal foci of restricted diffusion to suggest acute or subacute ischemia. Gray-white matter differentiation maintained. No areas of chronic cortical infarction. No acute intracranial hemorrhage. Multiple chronic micro hemorrhages noted about the cerebellum and thalami, most characteristic of chronic poorly controlled hypertension. No mass lesion, midline shift or mass effect. No hydrocephalus or extra-axial fluid collection. Pituitary gland within normal limits. Vascular: Major intracranial vascular flow voids are maintained. Skull and upper cervical spine: Craniocervical junction within normal limits. Diffuse loss of  normal bone marrow signal, nonspecific but can be seen with anemia, smoking, obesity, and infiltrative/myelofibrotic marrow processes. No scalp soft tissue abnormality. Sinuses/Orbits: Globes orbital soft tissues within normal limits. Paranasal sinuses are largely clear. Large right mastoid effusion, of uncertain significance. Visualized nasopharynx unremarkable. Other: None. IMPRESSION: 1. No acute intracranial abnormality. 2. Moderately advanced chronic microvascular ischemic disease with multiple remote lacunar infarcts about the deep gray nuclei. 3. Multiple chronic micro hemorrhages about the cerebellum and thalami, most characteristic of chronic poorly controlled hypertension. 4. Large right mastoid effusion, of uncertain significance. Correlation with physical exam recommended. Electronically Signed   By: Morene Hoard M.D.   On: 11/10/2023 00:45   CT ANGIO HEAD NECK W WO CM W PERF (CODE STROKE) Result Date: 11/09/2023 CLINICAL DATA:  Initial evaluation for acute neuro deficit, stroke suspected. EXAM: CT ANGIOGRAPHY HEAD AND NECK CT PERFUSION BRAIN TECHNIQUE: Multidetector CT imaging of the head and neck was performed using the standard protocol during bolus  administration of intravenous contrast. Multiplanar CT image reconstructions and MIPs were obtained to evaluate the vascular anatomy. Carotid stenosis measurements (when applicable) are obtained utilizing NASCET criteria, using the distal internal carotid diameter as the denominator. Multiphase CT imaging of the brain was performed following IV bolus contrast injection. Subsequent parametric perfusion maps were calculated using RAPID software. RADIATION DOSE REDUCTION: This exam was performed according to the departmental dose-optimization program which includes automated exposure control, adjustment of the mA and/or kV according to patient size and/or use of iterative reconstruction technique. CONTRAST:  OMNIPAQUE  IOHEXOL  350 MG/ML SOLN COMPARISON:  CT from earlier the same day. FINDINGS: CTA NECK FINDINGS Aortic arch: Visualized aortic arch within normal limits for caliber with standard 3 vessel morphology. Aortic atherosclerosis. No significant stenosis about the origin of the great vessels. Right carotid system: Right common and internal carotid arteries are tortuous but patent without stenosis or dissection. Left carotid system: Left common and internal carotid arteries are tortuous but patent without stenosis or dissection. Vertebral arteries: Both vertebral arteries arise from the subclavian arteries. Left vertebral artery dominant. Vertebral arteries patent without stenosis or dissection. Skeleton: No worrisome osseous lesions. Moderate spondylosis at C5-6. Other neck: No other acute finding. Right IJ approach central venous catheter in place. Upper chest: No other acute finding. Review of the MIP images confirms the above findings CTA HEAD FINDINGS Anterior circulation: Inch mild atheromatous change about the carotid siphons without stenosis. A1 segments patent bilaterally. Normal anterior communicating artery complex. Atheromatous irregularity about the ACAs without proximal high-grade stenosis. No  M1 stenosis or occlusion. Distal MCA branches perfused and symmetric. Posterior circulation: Vertebrobasilar system with somewhat dolichoectatic. Both V4 segments patent without stenosis. Both PICA grossly patent at their origins. Basilar mildly irregular but patent without stenosis. Superior cerebellar and posterior cerebral arteries patent bilaterally. Venous sinuses: Patent allowing for timing the contrast bolus. Anatomic variants: None significant.  No aneurysm. Review of the MIP images confirms the above findings CT Brain Perfusion Findings: ASPECTS: 10 CBF (<30%) Volume: 0mL Perfusion (Tmax>6.0s) volume: 0mL Mismatch Volume: 0mL Infarction Location:Negative CT perfusion with no evidence for acute ischemia or other perfusion abnormality. IMPRESSION: 1. Negative CTA of the head and neck. No large vessel occlusion or other emergent finding. 2. Negative CT perfusion with no evidence for acute ischemia or other perfusion abnormality. 3. Diffuse tortuosity of the major arterial vasculature of the head and neck, suggesting chronic underlying hypertension. No hemodynamically significant or correctable stenosis. 4.  Aortic Atherosclerosis (ICD10-I70.0). Electronically Signed   By:  Morene Hoard M.D.   On: 11/09/2023 21:43   CT HEAD CODE STROKE WO CONTRAST Result Date: 11/09/2023 CLINICAL DATA:  Code stroke. Initial evaluation for acute neuro deficit, stroke suspected. EXAM: CT HEAD WITHOUT CONTRAST TECHNIQUE: Contiguous axial images were obtained from the base of the skull through the vertex without intravenous contrast. RADIATION DOSE REDUCTION: This exam was performed according to the departmental dose-optimization program which includes automated exposure control, adjustment of the mA and/or kV according to patient size and/or use of iterative reconstruction technique. COMPARISON:  Prior study from 10/24/2023 FINDINGS: Brain: Mild age-related cerebral atrophy. Patchy hypodensity involving the supratentorial  cerebral white matter, consistent chronic small vessel ischemic disease, moderately advanced in nature. Remote lacunar infarcts present about the thalami. No acute intracranial hemorrhage. No acute large vessel territory infarct. No mass lesion or midline shift. No hydrocephalus or extra-axial fluid collection. Vascular: No abnormal hyperdense vessel. Scattered vascular calcifications noted within the carotid siphons. Skull: Scalp soft tissues within normal limits.  Calvarium intact. Sinuses/Orbits: Globes orbital soft tissues within normal limits. Paranasal sinuses are clear. Large right mastoid effusion, similar to prior, and likely chronic. Left mastoid air cells are clear. Other: None. ASPECTS [facility] Stroke Program Early CT Score) - Ganglionic level infarction (caudate, lentiform nuclei, internal capsule, insula, M1-M3 cortex): 7 - Supraganglionic infarction (M4-M6 cortex): 3 Total score (0-10 with 10 being normal): 10 IMPRESSION: 1. No acute intracranial abnormality. 2. Aspects is 10. 3. Atrophy with moderately advanced chronic microvascular ischemic disease. 4. Large right mastoid effusion, similar to prior, likely chronic. Correlation with physical exam suggested. Results were called by telephone at the time of interpretation on 11/09/2023 at 7:26 pm to provider [facility] , who verbally acknowledged these results. Electronically Signed   By: Morene Hoard M.D.   On: 11/09/2023 19:27      Discharge Exam: Vitals:   12/09/23 1453 12/09/23 1455  BP: (!) 159/106 (!) 163/98  Pulse:    Resp: 18 19  Temp:  98.2 F (36.8 C)  SpO2: 100% 100%   Vitals:   12/09/23 1400 12/09/23 1430 12/09/23 1453 12/09/23 1455  BP: (!) 143/109 (!) 142/107 (!) 159/106 (!) 163/98  Pulse: 73     Resp: 13 18 18 19   Temp:    98.2 F (36.8 C)  TempSrc:      SpO2:   100% 100%  Weight:        Constitutional:  Normal appearance. Non toxic-appearing.  HENT: Head Normocephalic and atraumatic.  Mucous membranes  are moist.  Eyes:  Extraocular intact. Conjunctivae normal.  Cardiovascular: Rate and Rhythm: Normal rate and regular rhythm.  Pulmonary: Non labored, symmetric rise of chest wall.  Skin: warm and dry. not jaundiced.  Neurological: No focal deficit present. alert. Oriented.  Psychiatric: Mood and Affect congruent.    The results of significant diagnostics from this hospitalization (including imaging, microbiology, ancillary and laboratory) are listed below for reference.     Microbiology: Recent Results (from the past 240 hours)  Resp panel by RT-PCR (RSV, Flu A&B, Covid) Anterior Nasal Swab     Status: None   Collection Time: 12/06/23  7:00 AM   Specimen: Anterior Nasal Swab  Result Value Ref Range Status   SARS Coronavirus 2 by RT PCR NEGATIVE NEGATIVE Final    Comment: (NOTE) SARS-CoV-2 target nucleic acids are NOT DETECTED.  The SARS-CoV-2 RNA is generally detectable in upper respiratory specimens during the acute phase of infection. The lowest concentration of SARS-CoV-2 viral copies this assay  can detect is 138 copies/mL. A negative result does not preclude SARS-Cov-2 infection and should not be used as the sole basis for treatment or other patient management decisions. A negative result may occur with  improper specimen collection/handling, submission of specimen other than nasopharyngeal swab, presence of viral mutation(s) within the areas targeted by this assay, and inadequate number of viral copies(<138 copies/mL). A negative result must be combined with clinical observations, patient history, and epidemiological information. The expected result is Negative.  Fact Sheet for Patients:  BloggerCourse.com  Fact Sheet for Healthcare Providers:  SeriousBroker.it  This test is no t yet approved or cleared by the [place]  FDA and  has been authorized for detection and/or diagnosis of SARS-CoV-2 by FDA under an  Emergency Use Authorization (EUA). This EUA will remain  in effect (meaning this test can be used) for the duration of the COVID-19 declaration under Section 564(b)(1) of the Act, 21 U.S.C.section 360bbb-3(b)(1), unless the authorization is terminated  or revoked sooner.       Influenza A by PCR NEGATIVE NEGATIVE Final   Influenza B by PCR NEGATIVE NEGATIVE Final    Comment: (NOTE) The Xpert Xpress SARS-CoV-2/FLU/RSV plus assay is intended as an aid in the diagnosis of influenza from Nasopharyngeal swab specimens and should not be used as a sole basis for treatment. Nasal washings and aspirates are unacceptable for Xpert Xpress SARS-CoV-2/FLU/RSV testing.  Fact Sheet for Patients: BloggerCourse.com  Fact Sheet for Healthcare Providers: SeriousBroker.it  This test is not yet approved or cleared by the [place]  FDA and has been authorized for detection and/or diagnosis of SARS-CoV-2 by FDA under an Emergency Use Authorization (EUA). This EUA will remain in effect (meaning this test can be used) for the duration of the COVID-19 declaration under Section 564(b)(1) of the Act, 21 U.S.C. section 360bbb-3(b)(1), unless the authorization is terminated or revoked.     Resp Syncytial Virus by PCR NEGATIVE NEGATIVE Final    Comment: (NOTE) Fact Sheet for Patients: BloggerCourse.com  Fact Sheet for Healthcare Providers: SeriousBroker.it  This test is not yet approved or cleared by the [place]  FDA and has been authorized for detection and/or diagnosis of SARS-CoV-2 by FDA under an Emergency Use Authorization (EUA). This EUA will remain in effect (meaning this test can be used) for the duration of the COVID-19 declaration under Section 564(b)(1) of the Act, 21 U.S.C. section 360bbb-3(b)(1), unless the authorization is terminated or revoked.  Performed at [facility], [address] [place], [place] [address]      Labs: BNP (last 3 results) Recent Labs    11/26/23 2328 12/06/23 0735  BNP 463.1* 2,085.3*   Basic Metabolic Panel: Recent Labs  Lab 12/06/23 0735 12/07/23 0028  NA 140 140  K 4.1 4.3  CL 101 104  CO2 26 23  GLUCOSE 98 96  BUN 72* 85*  CREATININE 6.88* 7.29*  CALCIUM  8.8* 8.1*   Liver Function Tests: Recent Labs  Lab 12/06/23 0735  AST 16  ALT 8  ALKPHOS 350*  BILITOT 0.7  PROT 7.1  ALBUMIN 3.6   No results for input(s): LIPASE, AMYLASE in the last 168 hours. No results for input(s): AMMONIA in the last 168 hours. CBC: Recent Labs  Lab 12/06/23 0735 12/07/23 0028  WBC 6.8 7.0  NEUTROABS 4.5  --   HGB 7.9* 7.8*  HCT 24.9* 24.1*  MCV 84.7 82.8  PLT 173 151   Cardiac Enzymes: No results for input(s): CKTOTAL, CKMB, CKMBINDEX, TROPONINI in  the last 168 hours. BNP: Invalid input(s): POCBNP CBG: No results for input(s): GLUCAP in the last 168 hours. D-Dimer No results for input(s): DDIMER in the last 72 hours. Hgb A1c No results for input(s): HGBA1C in the last 72 hours. Lipid Profile No results for input(s): CHOL, HDL, LDLCALC, TRIG, CHOLHDL, LDLDIRECT in the last 72 hours. Thyroid function studies No results for input(s): TSH, T4TOTAL, T3FREE, THYROIDAB in the last 72 hours.  Invalid input(s): FREET3 Anemia work up No results for input(s): VITAMINB12, FOLATE, FERRITIN, TIBC, IRON, RETICCTPCT in the last 72 hours. Urinalysis    Component Value Date/Time   COLORURINE STRAW (A) 12/06/2023 0810   APPEARANCEUR CLEAR (A) 12/06/2023 0810   LABSPEC 1.008 12/06/2023 0810   PHURINE 8.0 12/06/2023 0810   GLUCOSEU NEGATIVE 12/06/2023 0810   HGBUR NEGATIVE 12/06/2023 0810   BILIRUBINUR NEGATIVE 12/06/2023 0810   KETONESUR NEGATIVE 12/06/2023 0810   PROTEINUR 30 (A) 12/06/2023 0810   UROBILINOGEN 0.2 05/24/2007 1054   NITRITE NEGATIVE  12/06/2023 0810   LEUKOCYTESUR NEGATIVE 12/06/2023 0810   Sepsis Labs Recent Labs  Lab 12/06/23 0735 12/07/23 0028  WBC 6.8 7.0   Microbiology Recent Results (from the past 240 hours)  Resp panel by RT-PCR (RSV, Flu A&B, Covid) Anterior Nasal Swab     Status: None   Collection Time: 12/06/23  7:00 AM   Specimen: Anterior Nasal Swab  Result Value Ref Range Status   SARS Coronavirus 2 by RT PCR NEGATIVE NEGATIVE Final    Comment: (NOTE) SARS-CoV-2 target nucleic acids are NOT DETECTED.  The SARS-CoV-2 RNA is generally detectable in upper respiratory specimens during the acute phase of infection. The lowest concentration of SARS-CoV-2 viral copies this assay can detect is 138 copies/mL. A negative result does not preclude SARS-Cov-2 infection and should not be used as the sole basis for treatment or other patient management decisions. A negative result may occur with  improper specimen collection/handling, submission of specimen other than nasopharyngeal swab, presence of viral mutation(s) within the areas targeted by this assay, and inadequate number of viral copies(<138 copies/mL). A negative result must be combined with clinical observations, patient history, and epidemiological information. The expected result is Negative.  Fact Sheet for Patients:  BloggerCourse.com  Fact Sheet for Healthcare Providers:  SeriousBroker.it  This test is no t yet approved or cleared by the [place]  FDA and  has been authorized for detection and/or diagnosis of SARS-CoV-2 by FDA under an Emergency Use Authorization (EUA). This EUA will remain  in effect (meaning this test can be used) for the duration of the COVID-19 declaration under Section 564(b)(1) of the Act, 21 U.S.C.section 360bbb-3(b)(1), unless the authorization is terminated  or revoked sooner.       Influenza A by PCR NEGATIVE NEGATIVE Final   Influenza B by PCR NEGATIVE  NEGATIVE Final    Comment: (NOTE) The Xpert Xpress SARS-CoV-2/FLU/RSV plus assay is intended as an aid in the diagnosis of influenza from Nasopharyngeal swab specimens and should not be used as a sole basis for treatment. Nasal washings and aspirates are unacceptable for Xpert Xpress SARS-CoV-2/FLU/RSV testing.  Fact Sheet for Patients: BloggerCourse.com  Fact Sheet for Healthcare Providers: SeriousBroker.it  This test is not yet approved or cleared by the [place]  FDA and has been authorized for detection and/or diagnosis of SARS-CoV-2 by FDA under an Emergency Use Authorization (EUA). This EUA will remain in effect (meaning this test can be used) for the duration of the COVID-19 declaration under Section 564(b)(1)  of the Act, 21 U.S.C. section 360bbb-3(b)(1), unless the authorization is terminated or revoked.     Resp Syncytial Virus by PCR NEGATIVE NEGATIVE Final    Comment: (NOTE) Fact Sheet for Patients: BloggerCourse.com  Fact Sheet for Healthcare Providers: SeriousBroker.it  This test is not yet approved or cleared by the [place]  FDA and has been authorized for detection and/or diagnosis of SARS-CoV-2 by FDA under an Emergency Use Authorization (EUA). This EUA will remain in effect (meaning this test can be used) for the duration of the COVID-19 declaration under Section 564(b)(1) of the Act, 21 U.S.C. section 360bbb-3(b)(1), unless the authorization is terminated or revoked.  Performed at [facility], [address], [place], [place] [address]      Time coordinating discharge: 32 min   SIGNED: Lorane Poland, MD  [facility] 12/09/2023, 3:12 PM Pager   If 7PM-7AM, please contact night-coverage

## 2023-12-09 NOTE — TOC Transition Note (Signed)
 Transition of Care [facility]) - Discharge Note   Patient Details  Name: Felecity Lemaster MRN: [id number] Date of Birth: [birth year]  [facility]) CM/SW Contact:  Alek Borges C Elsia Lasota, RN Phone Number: 12/09/2023, 4:07 PM   Clinical Narrative:     Patient assigned room # F-12 Report will be called to [phone] Face sheet and medical necessity forms printed to the floor to be added to the EMS pack EMS arranged for 5pm Discharge summary and SNF transfer report sent in HUB.  Nurse provided with details TOC signing off.    Final next level of care: Skilled Nursing Facility Barriers to Discharge: Barriers Resolved   Patient Goals and CMS Choice Patient states their goals for this hospitalization and ongoing recovery are:: SNF          Discharge Placement                Patient to be transferred to facility by: [facility]   Patient and family notified of of transfer: 12/09/23  Discharge Plan and Services Additional resources added to the After Visit Summary for                                       Social Drivers of Health (SDOH) Interventions SDOH Screenings   Food Insecurity: No Food Insecurity (12/07/2023)  Housing: Low Risk  (12/07/2023)  Transportation Needs: No Transportation Needs (12/07/2023)  Utilities: Patient Declined (12/07/2023)  Financial Resource Strain: Patient Declined (01/03/2023)   Received from [facility]  Social Connections: Socially Isolated (11/10/2023)  Tobacco Use: High Risk (11/26/2023)     Readmission Risk Interventions     No data to display

## 2023-12-09 NOTE — TOC Progression Note (Addendum)
 Transition of Care [facility]) - Progression Note    Patient Details  Name: Allison Hill MRN: [id number] Date of Birth: [birth year]  Transition of Care [facility]) CM/SW Contact  Tomasa JAYSON Childes, RN Phone Number: 12/09/2023, 11:45 AM  Clinical Narrative:    Attempt to contact [name] at [facility], no answer. Left a message.  12:18pm Retrieved message from [name] at [place].  12:18pm Attempt to reach [name] at [place]. Left a message requesting return call.   12:31pm Spoke with [name]. Patient approved to return to facility today.          Expected Discharge Plan and Services                                               Social Determinants of Health (SDOH) Interventions SDOH Screenings   Food Insecurity: No Food Insecurity (12/07/2023)  Housing: Low Risk  (12/07/2023)  Transportation Needs: No Transportation Needs (12/07/2023)  Utilities: Patient Declined (12/07/2023)  Financial Resource Strain: Patient Declined (01/03/2023)   Received from [facility]  Social Connections: Socially Isolated (11/10/2023)  Tobacco Use: High Risk (11/26/2023)    Readmission Risk Interventions     No data to display

## 2023-12-09 NOTE — Progress Notes (Signed)
 [facility]  ROUNDING NOTE   Subjective:   [name] was admitted to [facility] on 12/06/2023 for Hypertensive emergency [I16.1]  Last hemodialysis treatment was Friday.   Update: Patient is seen and evaluated during dialysis   HEMODIALYSIS FLOWSHEET:  Blood Flow Rate (mL/min): 349 mL/min Arterial Pressure (mmHg): -140.8 mmHg Venous Pressure (mmHg): 151.51 mmHg TMP (mmHg): 14.75 mmHg Ultrafiltration Rate (mL/min): 829 mL/min Dialysate Flow Rate (mL/min): 299 ml/min  Tolerating treatment well    Objective:  Vital signs in last 24 hours:  Temp:  [97.5 F (36.4 C)-98.1 F (36.7 C)] 97.7 F (36.5 C) (07/09 1102) Pulse Rate:  [72-80] 77 (07/09 1106) Resp:  [16-20] 17 (07/09 1130) BP: (121-151)/(76-100) 121/84 (07/09 1130) SpO2:  [94 %-100 %] 100 % (07/09 1106) Weight:  [78.7 kg-78.9 kg] 78.9 kg (07/09 1058)  Weight change: -11.5 kg Filed Weights   12/08/23 1146 12/09/23 0500 12/09/23 1058  Weight: 79.2 kg 78.7 kg 78.9 kg    Intake/Output: I/O last 3 completed shifts: In: 270 [P.O.:270] Out: 200 [Urine:200]   Intake/Output this shift:  Total I/O In: 340 [P.O.:340] Out: 150 [Urine:150]  Physical Exam: General: NAD, laying on bed  Head: Normocephalic, atraumatic. Moist oral mucosal membranes  Eyes: Anicteric  Lungs:  Clear to auscultation, room air  Heart: Regular rate and rhythm  Abdomen:  Soft, nontender  Extremities:  trace peripheral edema.  Neurologic: Alert, awake  Skin: No lesions  Access: RIJ permcath    Basic Metabolic Panel: Recent Labs  Lab 12/06/23 0735 12/07/23 0028  NA 140 140  K 4.1 4.3  CL 101 104  CO2 26 23  GLUCOSE 98 96  BUN 72* 85*  CREATININE 6.88* 7.29*  CALCIUM  8.8* 8.1*    Liver Function Tests: Recent Labs  Lab 12/06/23 0735  AST 16  ALT 8  ALKPHOS 350*  BILITOT 0.7  PROT 7.1  ALBUMIN 3.6   No results for input(s): LIPASE, AMYLASE in the last 168 hours. No results for input(s): AMMONIA  in the last 168 hours.  CBC: Recent Labs  Lab 12/06/23 0735 12/07/23 0028  WBC 6.8 7.0  NEUTROABS 4.5  --   HGB 7.9* 7.8*  HCT 24.9* 24.1*  MCV 84.7 82.8  PLT 173 151    Cardiac Enzymes: No results for input(s): CKTOTAL, CKMB, CKMBINDEX, TROPONINI in the last 168 hours.  BNP: Invalid input(s): POCBNP  CBG: No results for input(s): GLUCAP in the last 168 hours.  Microbiology: Results for orders placed or performed during the hospital encounter of 12/06/23  Resp panel by RT-PCR (RSV, Flu A&B, Covid) Anterior Nasal Swab     Status: None   Collection Time: 12/06/23  7:00 AM   Specimen: Anterior Nasal Swab  Result Value Ref Range Status   SARS Coronavirus 2 by RT PCR NEGATIVE NEGATIVE Final    Comment: (NOTE) SARS-CoV-2 target nucleic acids are NOT DETECTED.  The SARS-CoV-2 RNA is generally detectable in upper respiratory specimens during the acute phase of infection. The lowest concentration of SARS-CoV-2 viral copies this assay can detect is 138 copies/mL. A negative result does not preclude SARS-Cov-2 infection and should not be used as the sole basis for treatment or other patient management decisions. A negative result may occur with  improper specimen collection/handling, submission of specimen other than nasopharyngeal swab, presence of viral mutation(s) within the areas targeted by this assay, and inadequate number of viral copies(<138 copies/mL). A negative result must be combined with clinical observations, patient history, and epidemiological  information. The expected result is Negative.  Fact Sheet for Patients:  BloggerCourse.com  Fact Sheet for Healthcare Providers:  SeriousBroker.it  This test is no t yet approved or cleared by the [place]  FDA and  has been authorized for detection and/or diagnosis of SARS-CoV-2 by FDA under an Emergency Use Authorization (EUA). This EUA will remain   in effect (meaning this test can be used) for the duration of the COVID-19 declaration under Section 564(b)(1) of the Act, 21 U.S.C.section 360bbb-3(b)(1), unless the authorization is terminated  or revoked sooner.       Influenza A by PCR NEGATIVE NEGATIVE Final   Influenza B by PCR NEGATIVE NEGATIVE Final    Comment: (NOTE) The Xpert Xpress SARS-CoV-2/FLU/RSV plus assay is intended as an aid in the diagnosis of influenza from Nasopharyngeal swab specimens and should not be used as a sole basis for treatment. Nasal washings and aspirates are unacceptable for Xpert Xpress SARS-CoV-2/FLU/RSV testing.  Fact Sheet for Patients: BloggerCourse.com  Fact Sheet for Healthcare Providers: SeriousBroker.it  This test is not yet approved or cleared by the [place]  FDA and has been authorized for detection and/or diagnosis of SARS-CoV-2 by FDA under an Emergency Use Authorization (EUA). This EUA will remain in effect (meaning this test can be used) for the duration of the COVID-19 declaration under Section 564(b)(1) of the Act, 21 U.S.C. section 360bbb-3(b)(1), unless the authorization is terminated or revoked.     Resp Syncytial Virus by PCR NEGATIVE NEGATIVE Final    Comment: (NOTE) Fact Sheet for Patients: BloggerCourse.com  Fact Sheet for Healthcare Providers: SeriousBroker.it  This test is not yet approved or cleared by the [place]  FDA and has been authorized for detection and/or diagnosis of SARS-CoV-2 by FDA under an Emergency Use Authorization (EUA). This EUA will remain in effect (meaning this test can be used) for the duration of the COVID-19 declaration under Section 564(b)(1) of the Act, 21 U.S.C. section 360bbb-3(b)(1), unless the authorization is terminated or revoked.  Performed at [facility], [address] [place], [place] [address]      Coagulation Studies: No results for input(s): LABPROT, INR in the last 72 hours.  Urinalysis: No results for input(s): COLORURINE, LABSPEC, PHURINE, GLUCOSEU, HGBUR, BILIRUBINUR, KETONESUR, PROTEINUR, UROBILINOGEN, NITRITE, LEUKOCYTESUR in the last 72 hours.  Invalid input(s): APPERANCEUR     Imaging: ECHOCARDIOGRAM COMPLETE Result Date: 12/08/2023    ECHOCARDIOGRAM REPORT   Patient Name:   Allison Hill Date of Exam: 12/08/2023 Medical Rec #:  [id number]        Height:       63.0 in Accession #:    [id number]       Weight:       196.6 lb Date of Birth:  03-26-1971        BSA:          1.920 m Patient Age:    53 years         BP:           149/95 mmHg Patient Gender: F                HR:           73 bpm. Exam Location:  [facility] Procedure: 2D Echo, Color Doppler and Cardiac Doppler (Both Spectral and Color            Flow Doppler were utilized during procedure). Indications:     CHF-acute systolic I50.21  History:  Patient has no prior history of Echocardiogram examinations.                  CHF; Risk Factors:Hypertension.  Sonographer:     Christopher Furnace Referring Phys:  [id number] AVA SWAYZE Diagnosing Phys: Deatrice Cage MD IMPRESSIONS  1. Left ventricular ejection fraction, by estimation, is 55 to 60%. The left ventricle has normal function. The left ventricle has no regional wall motion abnormalities. There is moderate left ventricular hypertrophy. Left ventricular diastolic parameters are indeterminate.  2. Right ventricular systolic function is normal. The right ventricular size is normal. Tricuspid regurgitation signal is inadequate for assessing PA pressure.  3. Left atrial size was moderately dilated.  4. The mitral valve is normal in structure. Moderate mitral valve regurgitation. No evidence of mitral stenosis.  5. The aortic valve is normal in structure. Aortic valve regurgitation is trivial. No aortic stenosis is present.  6. The inferior vena cava is normal in  size with greater than 50% respiratory variability, suggesting right atrial pressure of 3 mmHg. FINDINGS  Left Ventricle: Left ventricular ejection fraction, by estimation, is 55 to 60%. The left ventricle has normal function. The left ventricle has no regional wall motion abnormalities. The left ventricular internal cavity size was normal in size. There is  moderate left ventricular hypertrophy. Left ventricular diastolic parameters are indeterminate. Right Ventricle: The right ventricular size is normal. No increase in right ventricular wall thickness. Right ventricular systolic function is normal. Tricuspid regurgitation signal is inadequate for assessing PA pressure. The tricuspid regurgitant velocity is 2.20 m/s, and with an assumed right atrial pressure of 3 mmHg, the estimated right ventricular systolic pressure is 22.4 mmHg. Left Atrium: Left atrial size was moderately dilated. Right Atrium: Right atrial size was normal in size. Pericardium: There is no evidence of pericardial effusion. Mitral Valve: The mitral valve is normal in structure. Moderate mitral valve regurgitation. No evidence of mitral valve stenosis. MV peak gradient, 7.7 mmHg. The mean mitral valve gradient is 3.0 mmHg. Tricuspid Valve: The tricuspid valve is normal in structure. Tricuspid valve regurgitation is trivial. No evidence of tricuspid stenosis. Aortic Valve: The aortic valve is normal in structure. Aortic valve regurgitation is trivial. No aortic stenosis is present. Aortic valve mean gradient measures 4.0 mmHg. Aortic valve peak gradient measures 7.1 mmHg. Aortic valve area, by VTI measures 2.18 cm. Pulmonic Valve: The pulmonic valve was normal in structure. Pulmonic valve regurgitation is not visualized. No evidence of pulmonic stenosis. Aorta: The aortic root is normal in size and structure. Venous: The inferior vena cava is normal in size with greater than 50% respiratory variability, suggesting right atrial pressure of 3 mmHg.  IAS/Shunts: No atrial level shunt detected by color flow Doppler.  LEFT VENTRICLE PLAX 2D LVIDd:         4.73 cm   Diastology LVIDs:         3.46 cm   LV e' medial:    10.20 cm/s LV PW:         2.32 cm   LV E/e' medial:  10.4 LV IVS:        1.65 cm   LV e' lateral:   8.61 cm/s LVOT diam:     2.10 cm   LV E/e' lateral: 12.3 LV SV:         64 LV SV Index:   33 LVOT Area:     3.46 cm  RIGHT VENTRICLE RV Basal diam:  4.30 cm RV Mid diam:  3.14 cm LEFT ATRIUM              Index        RIGHT ATRIUM           Index LA diam:        4.20 cm  2.19 cm/m   RA Area:     13.90 cm LA Vol (A2C):   170.0 ml 88.55 ml/m  RA Volume:   33.30 ml  17.35 ml/m LA Vol (A4C):   102.0 ml 53.13 ml/m LA Biplane Vol: 138.0 ml 71.88 ml/m  AORTIC VALVE AV Area (Vmax):    2.25 cm AV Area (Vmean):   2.11 cm AV Area (VTI):     2.18 cm AV Vmax:           133.00 cm/s AV Vmean:          97.700 cm/s AV VTI:            0.293 m AV Peak Grad:      7.1 mmHg AV Mean Grad:      4.0 mmHg LVOT Vmax:         86.50 cm/s LVOT Vmean:        59.400 cm/s LVOT VTI:          0.184 m LVOT/AV VTI ratio: 0.63  AORTA Ao Root diam: 3.10 cm MITRAL VALVE                TRICUSPID VALVE MV Area (PHT): 4.21 cm     TR Peak grad:   19.4 mmHg MV Area VTI:   1.92 cm     TR Vmax:        220.00 cm/s MV Peak grad:  7.7 mmHg MV Mean grad:  3.0 mmHg     SHUNTS MV Vmax:       1.39 m/s     Systemic VTI:  0.18 m MV Vmean:      78.0 cm/s    Systemic Diam: 2.10 cm MV Decel Time: 180 msec MV E velocity: 106.00 cm/s MV A velocity: 62.10 cm/s MV E/A ratio:  1.71 Deatrice Cage MD Electronically signed by Deatrice Cage MD Signature Date/Time: 12/08/2023/1:57:48 PM    Final      Medications:     acetaminophen   500 mg Oral TID   amLODipine   10 mg Oral Daily   aspirin  EC  81 mg Oral Daily   atorvastatin   40 mg Oral Daily   calcium  carbonate  400 mg of elemental calcium  Oral BID WC   Chlorhexidine  Gluconate Cloth  6 each Topical Q0600   cinacalcet   60 mg Oral Q supper    [START ON 12/11/2023] cloNIDine   0.3 mg Transdermal Weekly   diclofenac  Sodium  2 g Topical BID   furosemide   80 mg Oral Daily   gabapentin   200 mg Oral TID   heparin   5,000 Units Subcutaneous Q8H   hydrALAZINE   100 mg Oral TID   irbesartan   300 mg Oral QPM   isosorbide  mononitrate  120 mg Oral Daily   melatonin  5 mg Oral QHS   metoprolol  tartrate  50 mg Oral BID   pantoprazole   40 mg Oral Daily   sevelamer  carbonate  1,600 mg Oral TID WC   terazosin   2 mg Oral BID   acetaminophen  **OR** acetaminophen , bisacodyl , fluticasone , hydrALAZINE , hydrOXYzine , polyethylene glycol, senna-docusate  Assessment/ Plan:  [name] is a 53 y.o.  female with end stage renal disease on hemodialysis, hypertension, congestive heart  failure, migraines, intracranial hemorrhage, cocaine abuse and sickle cell trait who is admitted to [facility] on 12/06/2023 for Hypertensive emergency [I16.1]  End stage renal disease: MTWF dialysis (four times a week) at [facility]. Followed by [facility].  - Receiving dialysis today, UF goal 1.5-2L as tolerated.  - Next treatment scheduled for Friday.  - Will obtain standing weight after treatment for dry weight.   Hypertension with chronic kidney disease: urgency on admission with acute exacerbation of congestive heart failure - resumed clonidine , hydralazine , isosorbide ,  irbesartan , furosemide , metoprolol , amlodipine , and terazosin  - Blood pressure 121/84 currenlty  Anemia with chronic kidney disease and sickle cell trait - ESA with HD treatments - Awaiting updated labs  Secondary Hyperparathyroidism:  - cinacalcet  - sevelamer  with meals.    LOS: 3 Avy Barlett 7/9/202512:15 PM
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
## 2023-12-18 ENCOUNTER — Observation Stay

## 2023-12-18 ENCOUNTER — Other Ambulatory Visit: Payer: Self-pay

## 2023-12-18 ENCOUNTER — Emergency Department

## 2023-12-18 ENCOUNTER — Observation Stay
Admission: EM | Admit: 2023-12-18 | Discharge: 2023-12-19 | Disposition: A | Discharge status: Home / Self Care | Attending: Internal Medicine | Admitting: Internal Medicine

## 2023-12-18 DIAGNOSIS — I509 Heart failure, unspecified: Secondary | ICD-10-CM | POA: Insufficient documentation

## 2023-12-18 DIAGNOSIS — R2681 Unsteadiness on feet: Secondary | ICD-10-CM | POA: Insufficient documentation

## 2023-12-18 DIAGNOSIS — I132 Hypertensive heart and chronic kidney disease with heart failure and with stage 5 chronic kidney disease, or end stage renal disease: Secondary | ICD-10-CM | POA: Insufficient documentation

## 2023-12-18 DIAGNOSIS — Z79899 Other long term (current) drug therapy: Secondary | ICD-10-CM | POA: Diagnosis not present

## 2023-12-18 DIAGNOSIS — R079 Chest pain, unspecified: Secondary | ICD-10-CM | POA: Diagnosis present

## 2023-12-18 DIAGNOSIS — I160 Hypertensive urgency: Secondary | ICD-10-CM | POA: Diagnosis not present

## 2023-12-18 DIAGNOSIS — K5730 Diverticulosis of large intestine without perforation or abscess without bleeding: Secondary | ICD-10-CM | POA: Insufficient documentation

## 2023-12-18 DIAGNOSIS — R471 Dysarthria and anarthria: Secondary | ICD-10-CM

## 2023-12-18 DIAGNOSIS — Z7901 Long term (current) use of anticoagulants: Secondary | ICD-10-CM | POA: Insufficient documentation

## 2023-12-18 DIAGNOSIS — Z8673 Personal history of transient ischemic attack (TIA), and cerebral infarction without residual deficits: Secondary | ICD-10-CM

## 2023-12-18 DIAGNOSIS — I259 Chronic ischemic heart disease, unspecified: Secondary | ICD-10-CM

## 2023-12-18 DIAGNOSIS — F17210 Nicotine dependence, cigarettes, uncomplicated: Secondary | ICD-10-CM | POA: Insufficient documentation

## 2023-12-18 DIAGNOSIS — R4781 Slurred speech: Secondary | ICD-10-CM | POA: Diagnosis not present

## 2023-12-18 DIAGNOSIS — I700 Atherosclerosis of aorta: Secondary | ICD-10-CM | POA: Diagnosis not present

## 2023-12-18 DIAGNOSIS — N186 End stage renal disease: Secondary | ICD-10-CM | POA: Insufficient documentation

## 2023-12-18 DIAGNOSIS — Z992 Dependence on renal dialysis: Secondary | ICD-10-CM | POA: Diagnosis not present

## 2023-12-18 LAB — DIFFERENTIAL
Abs Immature Granulocytes: 0.02 K/uL (ref 0.00–0.07)
Basophils Absolute: 0 K/uL (ref 0.0–0.1)
Basophils Relative: 0 %
Eosinophils Absolute: 0.2 K/uL (ref 0.0–0.5)
Eosinophils Relative: 3 %
Immature Granulocytes: 0 %
Lymphocytes Relative: 20 %
Lymphs Abs: 1.2 K/uL (ref 0.7–4.0)
Monocytes Absolute: 0.7 K/uL (ref 0.1–1.0)
Monocytes Relative: 11 %
Neutro Abs: 4 K/uL (ref 1.7–7.7)
Neutrophils Relative %: 66 %

## 2023-12-18 LAB — COMPREHENSIVE METABOLIC PANEL WITH GFR
ALT: 9 U/L (ref 0–44)
ALT: 9 U/L (ref 0–44)
AST: 22 U/L (ref 15–41)
AST: 17 U/L (ref 15–41)
Albumin: 3.9 g/dL (ref 3.5–5.0)
Albumin: 4.2 g/dL (ref 3.5–5.0)
Alkaline Phosphatase: 340 U/L — ABNORMAL HIGH (ref 38–126)
Alkaline Phosphatase: 371 U/L — ABNORMAL HIGH (ref 38–126)
Anion gap: 17 — ABNORMAL HIGH (ref 5–15)
Anion gap: 18 — ABNORMAL HIGH (ref 5–15)
BUN: 40 mg/dL — ABNORMAL HIGH (ref 6–20)
BUN: 47 mg/dL — ABNORMAL HIGH (ref 6–20)
CO2: 26 mmol/L (ref 22–32)
CO2: 24 mmol/L (ref 22–32)
Calcium: 9.9 mg/dL (ref 8.9–10.3)
Calcium: 9.6 mg/dL (ref 8.9–10.3)
Chloride: 95 mmol/L — ABNORMAL LOW (ref 98–111)
Chloride: 94 mmol/L — ABNORMAL LOW (ref 98–111)
Creatinine, Ser: 4.43 mg/dL — ABNORMAL HIGH (ref 0.44–1.00)
Creatinine, Ser: 5.1 mg/dL — ABNORMAL HIGH (ref 0.44–1.00)
GFR, Estimated: 11 mL/min — ABNORMAL LOW (ref >60)
GFR, Estimated: 10 mL/min — ABNORMAL LOW (ref >60)
Glucose, Bld: 99 mg/dL (ref 70–99)
Glucose, Bld: 72 mg/dL (ref 70–99)
Potassium: 3.5 mmol/L (ref 3.5–5.1)
Potassium: 3.9 mmol/L (ref 3.5–5.1)
Sodium: 138 mmol/L (ref 135–145)
Sodium: 136 mmol/L (ref 135–145)
Total Bilirubin: 0.5 mg/dL (ref 0.0–1.2)
Total Bilirubin: 0.6 mg/dL (ref 0.0–1.2)
Total Protein: 8 g/dL (ref 6.5–8.1)
Total Protein: 8.5 g/dL — ABNORMAL HIGH (ref 6.5–8.1)

## 2023-12-18 LAB — PROTIME-INR
INR: 1 (ref 0.8–1.2)
Prothrombin Time: 13.8 s (ref 11.4–15.2)

## 2023-12-18 LAB — CBC
HCT: 28.6 % — ABNORMAL LOW (ref 36.0–46.0)
Hemoglobin: 9.4 g/dL — ABNORMAL LOW (ref 12.0–15.0)
MCH: 27.7 pg (ref 26.0–34.0)
MCHC: 32.9 g/dL (ref 30.0–36.0)
MCV: 84.4 fL (ref 80.0–100.0)
Platelets: 182 K/uL (ref 150–400)
RBC: 3.39 MIL/uL — ABNORMAL LOW (ref 3.87–5.11)
RDW: 19.5 % — ABNORMAL HIGH (ref 11.5–15.5)
WBC: 6.1 K/uL (ref 4.0–10.5)
nRBC: 0 % (ref 0.0–0.2)

## 2023-12-18 LAB — TROPONIN I (HIGH SENSITIVITY)
Troponin I (High Sensitivity): 33 ng/L — ABNORMAL HIGH (ref <18)
Troponin I (High Sensitivity): 40 ng/L — ABNORMAL HIGH (ref <18)

## 2023-12-18 LAB — ETHANOL: Alcohol, Ethyl (B): <15 mg/dL (ref <15)

## 2023-12-18 LAB — CBG MONITORING, ED: Glucose-Capillary: 97 mg/dL (ref 70–99)

## 2023-12-18 LAB — APTT: aPTT: 47 s — ABNORMAL HIGH (ref 24–36)

## 2023-12-18 MED ORDER — HEPARIN SODIUM (PORCINE) 5000 UNIT/ML IJ SOLN
5000.0000 [IU] | Freq: Three times a day (TID) | INTRAMUSCULAR | Status: DC
  Administered 2023-12-18 – 2023-12-19 (×2): 5000 [IU] via SUBCUTANEOUS
  Filled 2023-12-18 (×2): qty 1

## 2023-12-18 MED ORDER — ACETAMINOPHEN 160 MG/5ML PO SOLN: 650.0000 mg | ORAL | Status: DC | PRN

## 2023-12-18 MED ORDER — CINACALCET HCL 30 MG PO TABS
60.0000 mg | ORAL_TABLET | Freq: Every day | ORAL | Status: DC
  Filled 2023-12-18: qty 2

## 2023-12-18 MED ORDER — CALCITRIOL 0.25 MCG PO CAPS
0.2500 ug | ORAL_CAPSULE | ORAL | Status: DC
  Administered 2023-12-19: 0.25 ug via ORAL
  Filled 2023-12-18: qty 1

## 2023-12-18 MED ORDER — SENNOSIDES-DOCUSATE SODIUM 8.6-50 MG PO TABS
2.0000 | ORAL_TABLET | Freq: Two times a day (BID) | ORAL | Status: DC | PRN
  Administered 2023-12-19: 2 via ORAL
  Filled 2023-12-18: qty 2

## 2023-12-18 MED ORDER — ACETAMINOPHEN 650 MG RE SUPP: 650.0000 mg | RECTAL | Status: DC | PRN

## 2023-12-18 MED ORDER — AMLODIPINE BESYLATE 10 MG PO TABS
10.0000 mg | ORAL_TABLET | Freq: Every day | ORAL | Status: DC
  Administered 2023-12-18 – 2023-12-19 (×2): 10 mg via ORAL
  Filled 2023-12-18 (×2): qty 1

## 2023-12-18 MED ORDER — ISOSORBIDE MONONITRATE ER 30 MG PO TB24
120.0000 mg | ORAL_TABLET | Freq: Every day | ORAL | Status: DC
  Administered 2023-12-18 – 2023-12-19 (×2): 120 mg via ORAL
  Filled 2023-12-18 (×3): qty 4

## 2023-12-18 MED ORDER — ONDANSETRON HCL 4 MG/2ML IJ SOLN: 4.0000 mg | Freq: Four times a day (QID) | INTRAMUSCULAR | Status: DC | PRN

## 2023-12-18 MED ORDER — SODIUM CHLORIDE 0.9 % IV SOLN: INTRAVENOUS | Status: DC

## 2023-12-18 MED ORDER — TERAZOSIN HCL 2 MG PO CAPS
2.0000 mg | ORAL_CAPSULE | Freq: Two times a day (BID) | ORAL | Status: DC
  Administered 2023-12-19: 2 mg via ORAL
  Filled 2023-12-18 (×2): qty 1

## 2023-12-18 MED ORDER — HYDRALAZINE HCL 20 MG/ML IJ SOLN
10.0000 mg | Freq: Once | INTRAMUSCULAR | Status: AC
  Administered 2023-12-18: 10 mg via INTRAVENOUS
  Filled 2023-12-18: qty 1

## 2023-12-18 MED ORDER — SENNOSIDES-DOCUSATE SODIUM 8.6-50 MG PO TABS: 1.0000 | ORAL_TABLET | Freq: Every evening | ORAL | Status: DC | PRN

## 2023-12-18 MED ORDER — IRBESARTAN 150 MG PO TABS
300.0000 mg | ORAL_TABLET | Freq: Every evening | ORAL | Status: DC
  Filled 2023-12-18: qty 2

## 2023-12-18 MED ORDER — METOPROLOL TARTRATE 50 MG PO TABS
50.0000 mg | ORAL_TABLET | Freq: Two times a day (BID) | ORAL | Status: DC
  Administered 2023-12-19: 50 mg via ORAL
  Filled 2023-12-18 (×2): qty 1

## 2023-12-18 MED ORDER — HYDRALAZINE HCL 50 MG PO TABS
100.0000 mg | ORAL_TABLET | Freq: Three times a day (TID) | ORAL | Status: DC
  Administered 2023-12-18 – 2023-12-19 (×2): 100 mg via ORAL
  Filled 2023-12-18 (×2): qty 2

## 2023-12-18 MED ORDER — IOHEXOL 350 MG/ML SOLN
50.0000 mL | Freq: Once | INTRAVENOUS | Status: AC | PRN
  Administered 2023-12-18: 50 mL via INTRAVENOUS

## 2023-12-18 MED ORDER — SEVELAMER CARBONATE 800 MG PO TABS
1600.0000 mg | ORAL_TABLET | Freq: Three times a day (TID) | ORAL | Status: DC
  Administered 2023-12-19: 1600 mg via ORAL
  Filled 2023-12-18: qty 2

## 2023-12-18 MED ORDER — CLONIDINE HCL 0.3 MG/24HR TD PTWK: 0.3000 mg | MEDICATED_PATCH | TRANSDERMAL | Status: DC

## 2023-12-18 MED ORDER — NITROGLYCERIN 0.4 MG SL SUBL
0.4000 mg | SUBLINGUAL_TABLET | SUBLINGUAL | Status: DC | PRN
  Administered 2023-12-18 – 2023-12-19 (×2): 0.4 mg via SUBLINGUAL
  Filled 2023-12-18 (×2): qty 1

## 2023-12-18 MED ORDER — ALUM & MAG HYDROXIDE-SIMETH 200-200-20 MG/5ML PO SUSP: 30.0000 mL | Freq: Once | ORAL | Status: DC

## 2023-12-18 MED ORDER — PANTOPRAZOLE SODIUM 40 MG IV SOLR
40.0000 mg | Freq: Two times a day (BID) | INTRAVENOUS | Status: DC
  Administered 2023-12-18: 40 mg via INTRAVENOUS
  Filled 2023-12-18: qty 10

## 2023-12-18 MED ORDER — ACETAMINOPHEN 325 MG PO TABS
650.0000 mg | ORAL_TABLET | ORAL | Status: DC | PRN
  Administered 2023-12-19: 650 mg via ORAL

## 2023-12-18 MED ORDER — ACETAMINOPHEN 325 MG PO TABS
650.0000 mg | ORAL_TABLET | ORAL | Status: DC | PRN
  Filled 2023-12-18: qty 2

## 2023-12-18 MED ORDER — IOHEXOL 350 MG/ML SOLN
100.0000 mL | Freq: Once | INTRAVENOUS | Status: AC | PRN
  Administered 2023-12-18: 100 mL via INTRAVENOUS

## 2023-12-18 MED ORDER — STROKE: EARLY STAGES OF RECOVERY BOOK: Freq: Once | Status: AC

## 2023-12-18 NOTE — Progress Notes (Signed)
   12/18/23 1445  Spiritual Encounters  Type of Visit Attempt (pt unavailable)  Referral source Code page  Reason for visit Code  OnCall Visit Yes  Spiritual Care Plan  Spiritual Care Issues Still Outstanding No further spiritual care needs at this time (see row info)   Chaplain paged to code stroke. Pt taken to CT and was unavailable.

## 2023-12-18 NOTE — ED Notes (Signed)
Activated Code Stroke w/[facility] 

## 2023-12-18 NOTE — Significant Event (Signed)
       CROSS COVER NOTE  NAME: Allison Hill MRN: [id number] DOB : June 15, 1970 ATTENDING PHYSICIAN: Roann Gouty, MD    Date of Service   12/18/2023   HPI/Events of Note   Message received from [facility] pt alert b/p 204/125 ht 75 here for c/o chest pain   Patient admitted with chest pain and new onset of slurred speech. MRI rules out new stroke but does show chronic hypertensive angiopathy and small vessel ischemia with mild volume loss per report.  She has chronic ESRD on HD and heart failure with preserved EF, with documented uncontrolled hypertension, secondary hyperparthyroidism, anemia of CKD with iron deficiency. ECHO 12/08/2023 with EF 55-60%; moderate LVH; moderate mitral valve regurgitation  Interventions   Assessment/Plan:    12/18/2023    8:00 PM 12/18/2023    3:20 PM 12/18/2023    3:00 PM  Vitals with BMI  Systolic 204 199   Diastolic 125 108   Pulse 76  75    Patient without severe distress Chest pain improved after 1 SL nitroglycerin   Hypertensive urgency 10 mg IV hydralazine  x 1 Resumed home meds not ordered on admission imdur , amlodipine  clonidine          Erminio LITTIE Cone NP [facility] Cross Cover 7pm-7am - check amion for availability Pager [phone]

## 2023-12-18 NOTE — Consult Note (Signed)
 NEUROLOGY CONSULT NOTE   Date of service: December 18, 2023 Patient Name: Allison Hill MRN:  [id number] DOB:  1970/10/03 Chief Complaint: Chest pain Requesting Provider: Roann Gouty, MD  History of Present Illness  Tona Qualley is a 53 y.o. female with hx of previous stroke who was at dialysis when she suddenly began having substernal chest pain.  She states that she has a history of stroke with chronic left-sided weakness, but the EMS folks did not notice this until they were en route.  Once they noticed that she was slurring her speech which they states she was not doing at the beginning, a code stroke was activated.  On evaluation on arrival, she does have a left hemiparesis however she states that this is her baseline, and it does seem to be in keeping with previous exams.  She states that she does not typically slur her speech this much, however.  LKW: 2:15 PM IV Thrombolysis: No, mild symptoms EVT: No, no LVO NIHSS score: 5 1A: Level of Consciousness - 0 1B: Ask Month and Age - 0 1C: 'Blink Eyes' & 'Squeeze Hands' - 0 2: Test Horizontal Extraocular Movements - 0 3: Test Visual Fields - 0 4: Test Facial Palsy - 1 5A: Test Left Arm Motor Drift - 1 5B: Test Right Arm Motor Drift - 0 6A: Test Left Leg Motor Drift - 1 6B: Test Right Leg Motor Drift - 0 7: Test Limb Ataxia - 0 8: Test Sensation - 0 9: Test Language/Aphasia- 0 10: Test Dysarthria - 1 11: Test Extinction/Inattention - 0    Past History   Past Medical History:  Diagnosis Date   CHF (congestive heart failure) (HCC)    Hypertension    Renal disorder     No past surgical history on file.  Family History: No family history on file.  Social History  reports that she has been smoking cigarettes. She has never used smokeless tobacco. No history on file for alcohol use and drug use.  Allergies  Allergen Reactions   Ceftriaxone Shortness Of Breath and Hives    Allergy, hives   Shellfish Allergy Hives    Minoxidil Swelling    Medications   Current Facility-Administered Medications:    [START ON 12/19/2023]  stroke: early stages of recovery book, , Does not apply, Once, Paudel, Keshab, MD   0.9 %  sodium chloride  infusion, , Intravenous, Continuous, Paudel, Keshab, MD   acetaminophen  (TYLENOL ) tablet 650 mg, 650 mg, Oral, Q4H PRN **OR** acetaminophen  (TYLENOL ) 160 MG/5ML solution 650 mg, 650 mg, Per Tube, Q4H PRN **OR** acetaminophen  (TYLENOL ) suppository 650 mg, 650 mg, Rectal, Q4H PRN, Paudel, Keshab, MD   acetaminophen  (TYLENOL ) tablet 650 mg, 650 mg, Oral, Q4H PRN, Paudel, Keshab, MD   alum & mag hydroxide-simeth (MAALOX/MYLANTA) 200-200-20 MG/5ML suspension 30 mL, 30 mL, Oral, Once, Paudel, Keshab, MD   heparin  injection 5,000 Units, 5,000 Units, Subcutaneous, Q8H, Paudel, Keshab, MD   ondansetron  (ZOFRAN ) injection 4 mg, 4 mg, Intravenous, Q6H PRN, Paudel, Keshab, MD   pantoprazole  (PROTONIX ) injection 40 mg, 40 mg, Intravenous, Q12H, Paudel, Keshab, MD   senna-docusate (Senokot-S) tablet 1 tablet, 1 tablet, Oral, QHS PRN, Paudel, Keshab, MD  Current Outpatient Medications:    acetaminophen  (TYLENOL ) 500 MG tablet, Take 500 mg by mouth 3 (three) times daily., Disp: , Rfl:    amLODipine  (NORVASC ) 10 MG tablet, Take 10 mg by mouth daily., Disp: , Rfl:    aspirin  EC 81 MG tablet, Take 81 mg by mouth  daily., Disp: , Rfl:    atorvastatin  (LIPITOR) 40 MG tablet, Take 40 mg by mouth daily., Disp: , Rfl:    calcitRIOL  (ROCALTROL ) 0.25 MCG capsule, Take 1 capsule (0.25 mcg total) by mouth 4 (four) times a week. Take one capsule by mouth every Mon, Tue, Wed, Fri with dialysis, Disp: , Rfl:    calcium  carbonate (TUMS - DOSED IN MG ELEMENTAL CALCIUM ) 500 MG chewable tablet, Chew 2 tablets (400 mg of elemental calcium  total) by mouth 2 (two) times daily., Disp: , Rfl:    cinacalcet  (SENSIPAR ) 60 MG tablet, Take 60 mg by mouth daily with supper., Disp: , Rfl:    cloNIDine  (CATAPRES  - DOSED IN MG/24  HR) 0.3 mg/24hr patch, Place 1 patch onto the skin once a week. On Fridays, Disp: , Rfl:    cloNIDine  (CATAPRES ) 0.1 MG tablet, Take 0.1 mg by mouth 2 (two) times daily as needed (Diastolic BP greater than 100 with manual BP)., Disp: , Rfl:    diclofenac  Sodium (VOLTAREN ) 1 % GEL, Apply 2 g topically 2 (two) times daily. Apply to left shoulder., Disp: , Rfl:    fluticasone  (FLONASE ) 50 MCG/ACT nasal spray, Place 1 spray into both nostrils daily as needed for allergies or rhinitis., Disp: , Rfl:    furosemide  (LASIX ) 80 MG tablet, Take 1 tablet (80 mg total) by mouth daily as needed for edema or fluid (hypertension)., Disp: 30 tablet, Rfl: 0   gabapentin  (NEURONTIN ) 100 MG capsule, Take 200 mg by mouth 3 (three) times daily., Disp: , Rfl:    hydrALAZINE  (APRESOLINE ) 100 MG tablet, Take 100 mg by mouth 3 (three) times daily., Disp: , Rfl:    irbesartan  (AVAPRO ) 300 MG tablet, Take 1 tablet (300 mg total) by mouth every evening., Disp: , Rfl:    isosorbide  mononitrate (IMDUR ) 120 MG 24 hr tablet, Take 120 mg by mouth daily., Disp: , Rfl:    meclizine  (ANTIVERT ) 25 MG tablet, Take 1 tablet (25 mg total) by mouth 2 (two) times daily as needed for dizziness., Disp: 30 tablet, Rfl: 0   melatonin 3 MG TABS tablet, Take 6 mg by mouth at bedtime., Disp: , Rfl:    metoprolol  tartrate (LOPRESSOR ) 50 MG tablet, Take 50 mg by mouth 2 (two) times daily., Disp: , Rfl:    nitroGLYCERIN (NITROSTAT) 0.4 MG SL tablet, Place 0.4 mg under the tongue every 5 (five) minutes as needed for chest pain., Disp: , Rfl:    pantoprazole  (PROTONIX ) 40 MG tablet, Take 1 tablet (40 mg total) by mouth daily., Disp: , Rfl:    polyethylene glycol (MIRALAX  / GLYCOLAX ) 17 g packet, Take 17 g by mouth daily as needed., Disp: , Rfl:    senna-docusate (SENOKOT-S) 8.6-50 MG tablet, Take 2 tablets by mouth 2 (two) times daily as needed for mild constipation., Disp: , Rfl:    sevelamer  carbonate (RENVELA ) 800 MG tablet, Take 1,600 mg by mouth 3  (three) times daily., Disp: , Rfl:    terazosin  (HYTRIN ) 2 MG capsule, Take 2 mg by mouth 2 (two) times daily., Disp: , Rfl:    epoetin  alfa (EPOGEN ) 4000 UNIT/ML injection, Inject 1 mL (4,000 Units total) into the vein Every Tuesday,Thursday,and Saturday with dialysis. (Patient taking differently: Inject 4,000 Units into the vein 4 (four) times a week. On Mon, Tues, Wed, Fri at dialysis), Disp: , Rfl:   Vitals   Vitals:   12/18/23 1500 12/18/23 1502 12/18/23 1520  BP:   (!) 199/108  Pulse: 75  Resp: 18    Temp:  98.3 F (36.8 C)   TempSrc:  Oral   SpO2: 100%      There is no height or weight on file to calculate BMI.   Physical Exam   Constitutional: Appears well-developed and well-nourished.   Neurologic Examination    Neuro: Mental Status: Patient is awake, alert, oriented to person, place, month, year, and situation. Patient is able to give a clear and coherent history. No signs of aphasia or neglect Cranial Nerves: II: Visual Fields are full. Pupils are equal, round, and reactive to light.   III,IV, VI: EOMI without ptosis or diploplia.  V: Facial sensation is symmetric to temperature VII: Facial movement is notable for left facial weakness.  When asked to put air into her cheeks, she holds the left side taut VIII: hearing is intact to voice X: Uvula elevates symmetrically XII: tongue is midline without atrophy or fasciculations.  Motor: Tone is normal. Bulk is normal. 5/5 strength was present on the right, she has 4/5 strength in the left arm and leg Sensory: Sensation is symmetric to light touch and temperature in the arms and legs. Cerebellar: Consistent with weakness on left        Labs/Imaging/Neurodiagnostic studies   CBC:  Recent Labs  Lab January 17, 2024 1445  WBC 6.1  NEUTROABS 4.0  HGB 9.4*  HCT 28.6*  MCV 84.4  PLT 182   Basic Metabolic Panel:  Lab Results  Component Value Date   NA 138 01-17-24   K 3.5 01-17-24   CO2 26 01/17/2024    GLUCOSE 99 01-17-24   BUN 40 (H) 01/17/24   CREATININE 4.43 (H) 01-17-2024   CALCIUM  9.9 17-Jan-2024   GFRNONAA 11 (L) 01-17-24   GFRAA  05/17/2007    >60        The eGFR has been calculated using the MDRD equation. This calculation has not been validated in all clinical   Lipid Panel:  Lab Results  Component Value Date   LDLCALC 74 09/12/2023   HgbA1c:  Lab Results  Component Value Date   HGBA1C 4.4 (L) 09/10/2023   Urine Drug Screen:     Component Value Date/Time   LABOPIA NONE DETECTED 12/07/2023 0810   COCAINSCRNUR NONE DETECTED 12/07/2023 0810   LABBENZ NONE DETECTED 12/07/2023 0810   AMPHETMU NONE DETECTED 12/07/2023 0810   THCU NONE DETECTED 12/07/2023 0810   LABBARB NONE DETECTED 12/07/2023 0810    Alcohol Level     Component Value Date/Time   ETH <15 01-17-2024 1802   INR  Lab Results  Component Value Date   INR 1.0 01/17/24   APTT  Lab Results  Component Value Date   APTT 47 (H) Jan 17, 2024   AED levels: No results found for: PHENYTOIN, ZONISAMIDE, LAMOTRIGINE, LEVETIRACETA  CT Head without contrast(Personally reviewed): No acute findings  CT angio Head and Neck with contrast(Personally reviewed): No LVO    ASSESSMENT   Nili Honda is a 53 y.o. female with worsening of her underlying dysarthria in the setting of acute chest pain.  Possibilities include recrudescence of previous stroke symptoms in the setting of stress, versus new ischemic infarct.  Given that she states that her weakness is no worse than it typically is, I would not favor thrombolytic therapy at this time.  She does have some new dysarthria and therefore an MRI is not unreasonable, but if this is negative I would not pursue any further testing at this time.  Of note, she has had  multiple similar presentations with negative imaging in the past.  RECOMMENDATIONS  MRI brain Further workup only if MRI is positive If MRI is negative, neurology will be  available as needed. ______________________________________________________________________    Bonney Aisha Seals, MD [facility]

## 2023-12-18 NOTE — ED Triage Notes (Incomplete)
 From [facility] for Chest Pain, EMS picked up in route noted

## 2023-12-18 NOTE — ED Triage Notes (Signed)
 EMS called to [facility] for Chest pain. Patient is noncompliant HTN/ Dialysis pt. In route to [facility] EMS noted L sided facial droop and L sided arm/ leg weakness. LVO 3. LKW 1415. FSBS 123

## 2023-12-18 NOTE — H&P (Signed)
 History and Physical    Allison Hill FMW:[id number] DOB: 04-28-1971 DOA: 12/18/2023  DOS: the patient was seen and examined on 12/18/2023  PCP: [facility] [employer]   Patient coming from: Home  I have personally briefly reviewed patient's old medical records in [facility]  Chief Complaint: Chest pain  HPI: Allison Hill is a pleasant 53 y.o. female with medical history significant for history of stroke, chronic left-sided weakness, CHF, HTN, ESRD on hemodialysis Monday Wednesday and Friday brought in for substernal chest pain that happened while she was on dialysis.  Patient is stated that while she was on dialysis she had severe chest pain, says it was sharp, 9/10 in intensity, nonradiating.  She was brought in for chest pain but while she was coming in, she had a slurring speech and worsening left-sided weakness and code stroke was called.  On evaluation upon arrival she had left hemiparesis however patient is stated that that is her baseline.  She stated that she does not typically slur her speech but it was there today. Neurology evaluated the patient and advised that due to dysarthria MRI is not unreasonable if negative would not pursue any further testing at this time.  ED Course: Upon arrival to the ED, patient is found to chest pain, left-sided weakness and slurred speech.  Neurology was consulted advised for possible MRI evaluation.  Hospital service was consulted for evaluation for admission for chest pain and possible stroke.  Review of Systems:  ROS  All other systems negative except as noted in the HPI.  Past Medical History:  Diagnosis Date   CHF (congestive heart failure) (HCC)    Hypertension    Renal disorder     No past surgical history on file.   reports that she has been smoking cigarettes. She has never used smokeless tobacco. No history on file for alcohol use and drug use.  Allergies  Allergen Reactions   Ceftriaxone Shortness Of Breath and Hives     Allergy, hives   Shellfish Allergy Hives   Minoxidil Swelling    No family history on file.  Prior to Admission medications   Medication Sig Start Date End Date Taking? Authorizing Provider  acetaminophen  (TYLENOL ) 500 MG tablet Take 500 mg by mouth 3 (three) times daily.   Yes [provider]  amLODipine  (NORVASC ) 10 MG tablet Take 10 mg by mouth daily.   Yes [provider]  aspirin  EC 81 MG tablet Take 81 mg by mouth daily.   Yes [provider]  atorvastatin  (LIPITOR) 40 MG tablet Take 40 mg by mouth daily. 07/05/23  Yes [provider]  calcitRIOL  (ROCALTROL ) 0.25 MCG capsule Take 1 capsule (0.25 mcg total) by mouth 4 (four) times a week. Take one capsule by mouth every Mon, Tue, Wed, Fri with dialysis 12/10/23  Yes Dezii, Alexandra, DO  calcium  carbonate (TUMS - DOSED IN MG ELEMENTAL CALCIUM ) 500 MG chewable tablet Chew 2 tablets (400 mg of elemental calcium  total) by mouth 2 (two) times daily. 09/23/23  Yes Awanda City, MD  cinacalcet  (SENSIPAR ) 60 MG tablet Take 60 mg by mouth daily with supper. 11/30/23  Yes [provider]  cloNIDine  (CATAPRES  - DOSED IN MG/24 HR) 0.3 mg/24hr patch Place 1 patch onto the skin once a week. On Fridays   Yes [provider]  cloNIDine  (CATAPRES ) 0.1 MG tablet Take 0.1 mg by mouth 2 (two) times daily as needed (Diastolic BP greater than 100 with manual BP). 10/06/23  Yes [provider]  diclofenac  Sodium (VOLTAREN ) 1 % GEL Apply 2 g topically 2 (two) times daily. Apply to left shoulder.   Yes [provider]  fluticasone  (FLONASE ) 50 MCG/ACT nasal spray Place 1 spray into both nostrils daily as needed for allergies or rhinitis. 09/23/23  Yes Awanda City, MD  furosemide  (LASIX ) 80 MG tablet Take 1 tablet (80 mg total) by mouth daily as needed for edema or fluid (hypertension). 11/14/23 12/18/23 Yes Bradler, Evan K, MD  gabapentin  (NEURONTIN ) 100 MG capsule Take 200 mg by mouth 3 (three) times  daily.   Yes [provider]  hydrALAZINE  (APRESOLINE ) 100 MG tablet Take 100 mg by mouth 3 (three) times daily. 12/26/22  Yes [provider]  irbesartan  (AVAPRO ) 300 MG tablet Take 1 tablet (300 mg total) by mouth every evening. 09/23/23  Yes Awanda City, MD  isosorbide  mononitrate (IMDUR ) 120 MG 24 hr tablet Take 120 mg by mouth daily.   Yes [provider]  meclizine  (ANTIVERT ) 25 MG tablet Take 1 tablet (25 mg total) by mouth 2 (two) times daily as needed for dizziness. 10/27/23  Yes Patel, Sona, MD  melatonin 3 MG TABS tablet Take 6 mg by mouth at bedtime. 12/26/22  Yes [provider]  metoprolol  tartrate (LOPRESSOR ) 50 MG tablet Take 50 mg by mouth 2 (two) times daily. 11/12/23  Yes [provider]  nitroGLYCERIN (NITROSTAT) 0.4 MG SL tablet Place 0.4 mg under the tongue every 5 (five) minutes as needed for chest pain. 05/29/23  Yes [provider]  pantoprazole  (PROTONIX ) 40 MG tablet Take 1 tablet (40 mg total) by mouth daily. 09/24/23  Yes Awanda City, MD  polyethylene glycol (MIRALAX  / GLYCOLAX ) 17 g packet Take 17 g by mouth daily as needed. 09/23/23  Yes Awanda City, MD  senna-docusate (SENOKOT-S) 8.6-50 MG tablet Take 2 tablets by mouth 2 (two) times daily as needed for mild constipation. 09/23/23  Yes Awanda City, MD  sevelamer  carbonate (RENVELA ) 800 MG tablet Take 1,600 mg by mouth 3 (three) times daily.   Yes [provider]  terazosin  (HYTRIN ) 2 MG capsule Take 2 mg by mouth 2 (two) times daily.   Yes [provider]  epoetin  alfa (EPOGEN ) 4000 UNIT/ML injection Inject 1 mL (4,000 Units total) into the vein Every Tuesday,Thursday,and Saturday with dialysis. Patient taking differently: Inject 4,000 Units into the vein 4 (four) times a week. On Mon, Tues, Wed, Fri at dialysis 09/24/23   Awanda City, MD    Physical Exam: Vitals:   12/18/23 1500 12/18/23 1502 12/18/23 1520  BP:   (!) 199/108  Pulse: 75    Resp: 18    Temp:   98.3 F (36.8 C)   TempSrc:  Oral   SpO2: 100%      Physical Exam   Constitutional: Alert, awake, calm, comfortable HEENT: Neck supple Respiratory: Clear to auscultation B/L, no wheezing, no rales.  Cardiovascular: Regular rate and rhythm, no murmurs / rubs / gallops. No extremity edema. 2+ pedal pulses. No carotid bruits.  Abdomen: Soft, no tenderness, Bowel sounds positive.  Musculoskeletal: no clubbing / cyanosis. Good ROM, no contractures. Normal muscle tone.  Skin: no rashes, lesions, ulcers. Neurologic: CN 2-12 grossly intact. Sensation intact, mild slurring of his speech, left-sided weakness Psychiatric: Alert and oriented x 3. Normal mood.    Labs on Admission: I have personally reviewed following labs and imaging studies  CBC: Recent Labs  Lab 12/18/23 1445  WBC 6.1  NEUTROABS 4.0  HGB  9.4*  HCT 28.6*  MCV 84.4  PLT 182   Basic Metabolic Panel: Recent Labs  Lab 12/18/23 1445  NA 138  K 3.5  CL 95*  CO2 26  GLUCOSE 99  BUN 40*  CREATININE 4.43*  CALCIUM  9.9   GFR: Estimated Creatinine Clearance: 14.6 mL/min (A) (by C-G formula based on SCr of 4.43 mg/dL (H)). Liver Function Tests: Recent Labs  Lab 12/18/23 1445  AST 22  ALT 9  ALKPHOS 340*  BILITOT 0.5  PROT 8.0  ALBUMIN 3.9   No results for input(s): LIPASE, AMYLASE in the last 168 hours. No results for input(s): AMMONIA in the last 168 hours. Coagulation Profile: Recent Labs  Lab 12/18/23 1445  INR 1.0   Cardiac Enzymes: Recent Labs  Lab 12/18/23 1445  TROPONINIHS 33*   BNP (last 3 results) Recent Labs    11/26/23 2328 12/06/23 0735  BNP 463.1* 2,085.3*   HbA1C: No results for input(s): HGBA1C in the last 72 hours. CBG: Recent Labs  Lab 12/18/23 1435  GLUCAP 97   Lipid Profile: No results for input(s): CHOL, HDL, LDLCALC, TRIG, CHOLHDL, LDLDIRECT in the last 72 hours. Thyroid Function Tests: No results for input(s): TSH, T4TOTAL, FREET4,  T3FREE, THYROIDAB in the last 72 hours. Anemia Panel: No results for input(s): VITAMINB12, FOLATE, FERRITIN, TIBC, IRON, RETICCTPCT in the last 72 hours. Urine analysis:    Component Value Date/Time   COLORURINE STRAW (A) 12/06/2023 0810   APPEARANCEUR CLEAR (A) 12/06/2023 0810   LABSPEC 1.008 12/06/2023 0810   PHURINE 8.0 12/06/2023 0810   GLUCOSEU NEGATIVE 12/06/2023 0810   HGBUR NEGATIVE 12/06/2023 0810   BILIRUBINUR NEGATIVE 12/06/2023 0810   KETONESUR NEGATIVE 12/06/2023 0810   PROTEINUR 30 (A) 12/06/2023 0810   UROBILINOGEN 0.2 05/24/2007 1054   NITRITE NEGATIVE 12/06/2023 0810   LEUKOCYTESUR NEGATIVE 12/06/2023 0810    Radiological Exams on Admission: I have personally reviewed images CT Angio Chest/Abd/Pel for Dissection W and/or W/WO Result Date: 12/18/2023 CLINICAL DATA:  Chest pain EXAM: CT ANGIOGRAPHY CHEST, ABDOMEN AND PELVIS TECHNIQUE: Non-contrast CT of the chest was initially obtained. Multidetector CT imaging through the chest, abdomen and pelvis was performed using the standard protocol during bolus administration of intravenous contrast. Multiplanar reconstructed images and MIPs were obtained and reviewed to evaluate the vascular anatomy. RADIATION DOSE REDUCTION: This exam was performed according to the departmental dose-optimization program which includes automated exposure control, adjustment of the mA and/or kV according to patient size and/or use of iterative reconstruction technique. CONTRAST:  OMNIPAQUE  IOHEXOL  350 MG/ML SOLN COMPARISON:  CTA chest dated 11/27/2023 FINDINGS: CTA CHEST FINDINGS Cardiovascular: Right IJ central venous catheter terminates at the superior cavoatrial junction. Preferential opacification of the thoracic aorta. No evidence of thoracic aortic aneurysm or dissection. Normal heart size. No pericardial effusion. No central pulmonary emboli. Small volume fluid within the pericardial recesses. Coronary artery calcifications.  Multichamber cardiomegaly. Mediastinum/Nodes: Imaged thyroid gland without nodules meeting criteria for imaging follow-up by size. Normal esophagus. No pathologically enlarged axillary, supraclavicular, mediastinal, or hilar lymph nodes. Lungs/Pleura: The central airways are patent. No focal consolidation. No pneumothorax. No pleural effusion. Musculoskeletal: No acute or abnormal lytic or blastic osseous lesions. Review of the MIP images confirms the above findings. CTA ABDOMEN AND PELVIS FINDINGS VASCULAR Aorta: Aortic atherosclerosis. Normal caliber aorta and branch vessels without aneurysm, dissection, vasculitis or significant stenosis. No active extravasation. Conventional celiac artery branching pattern. Single bilateral renal arteries. Veins: No obvious venous abnormality within the limitations of this arterial  phase study. Review of the MIP images confirms the above findings. NON-VASCULAR Hepatobiliary: Subcentimeter arterially enhancing focus within segment 8 (5:94), likely a flash filling venous malformation. In no intra or extrahepatic biliary ductal dilation. Normal gallbladder. Pancreas: No focal lesions or main ductal dilation. Spleen: Normal in size without focal abnormality. Adrenals/Urinary Tract: No adrenal nodules. Mildly atrophic kidneys. Bilateral subcentimeter hypodensities, too small to characterize but likely cyst. No hydronephrosis or calculi. No focal bladder wall thickening. Stomach/Bowel: Normal appearance of the stomach. No evidence of bowel wall thickening or inflammatory changes. Dilated rectum contains large volume stool. Moderate volume stool throughout the remainder of the colon. Colonic diverticulosis without acute diverticulitis. Normal appendix. Lymphatic: No enlarged abdominal or pelvic lymph nodes. Reproductive: No adnexal masses. Other: No free fluid, fluid collection, or free air. Musculoskeletal: No acute or abnormal lytic or blastic osseous lesions. Review of the MIP  images confirms the above findings. IMPRESSION: 1. No evidence of aortic aneurysm or dissection. 2. Dilated rectum contains large volume stool, which can be seen in the setting of fecal impaction. 3. Colonic diverticulosis without acute diverticulitis. 4. Multichamber cardiomegaly. 5. Aortic Atherosclerosis (ICD10-I70.0). Coronary artery calcifications. Assessment for potential risk factor modification, dietary therapy or pharmacologic therapy may be warranted, if clinically indicated. Electronically Signed   By: Limin  Xu M.D.   On: 12/18/2023 15:48   CT ANGIO HEAD NECK W WO CM (CODE STROKE) Result Date: 12/18/2023 CLINICAL DATA:  Provided history: Neuro deficit, acute, stroke suspected. EXAM: CT ANGIOGRAPHY HEAD AND NECK WITH AND WITHOUT CONTRAST TECHNIQUE: Multidetector CT imaging of the head and neck was performed using the standard protocol during bolus administration of intravenous contrast. Multiplanar CT image reconstructions and MIPs were obtained to evaluate the vascular anatomy. Carotid stenosis measurements (when applicable) are obtained utilizing NASCET criteria, using the distal internal carotid diameter as the denominator. RADIATION DOSE REDUCTION: This exam was performed according to the departmental dose-optimization program which includes automated exposure control, adjustment of the mA and/or kV according to patient size and/or use of iterative reconstruction technique. CONTRAST:  50mL OMNIPAQUE  IOHEXOL  350 MG/ML SOLN, OMNIPAQUE  IOHEXOL  350 MG/ML SOLN COMPARISON:  Noncontrast head CT performed earlier today 12/18/2023. Brain MRI 11/09/2023. CT angiogram head/neck 11/09/2023. FINDINGS: CTA NECK FINDINGS Aortic arch: Standard aortic branching. A sclerotic plaque within the visualized aortic arch and proximal major branch vessels of the neck. No hemodynamically significant innominate or proximal subclavian artery stenosis. Right carotid system: CCA and ICA patent within the neck without  stenosis. Minimal atherosclerotic plaque about the carotid bifurcation. Tortuosity and partially retropharyngeal course of the cervical ICA. Left carotid system: CCA and ICA patent within the neck without stenosis or significant atherosclerotic disease. Tortuosity and partially retropharyngeal course of the cervical ICA Vertebral arteries: Codominant and patent within the neck without stenosis or significant atherosclerotic disease. Skeleton: Nonspecific reversal of the expected cervical lordosis. Slight grade 1 anterolisthesis at C2-C3, C3-C4, C4-C5, C5-C6 and T1-T2. Cervical spondylosis. No acute fracture or aggressive osseous lesion. Other neck: Mildly enlarged and heterogeneous thyroid gland. Upper chest: No consolidation within the imaged lung apices. Partially visualized right-sided central venous catheter. Review of the MIP images confirms the above findings CTA HEAD FINDINGS Anterior circulation: The intracranial internal carotid arteries are patent. Atherosclerotic plaque within both vessels with no more than mild stenosis. The M1 middle cerebral arteries are patent. No M2 proximal branch occlusion or high-grade proximal stenosis. The anterior cerebral arteries are patent. Unchanged moderate stenosis within the left anterior cerebral artery A3 segment (series  12, image 21). No intracranial aneurysm is identified. Posterior circulation: The intracranial vertebral arteries are patent. The basilar artery is patent. The posterior cerebral arteries are patent. Posterior communicating arteries are diminutive or absent, bilaterally. Venous sinuses: Within the limitations of contrast timing, no convincing thrombus. Anatomic variants: As described. Other: Large right mastoid effusion. Review of the MIP images confirms the above findings No emergent large vessel occlusion identified. These results were communicated to Dr. Michaela at 3:47 pmon 7/18/2025by text page via the [facility] messaging system. IMPRESSION: CTA  neck: 1. The common carotid and internal carotid arteries are patent within the neck without stenosis. Minimal atherosclerotic plaque about the right carotid bifurcation. 2. The vertebral arteries are patent within the neck without stenosis or significant atherosclerotic disease. 3. Aortic Atherosclerosis (ICD10-I70.0). 4. Mildly enlarged and heterogeneous thyroid gland. A non-emergent thyroid ultrasound is recommended for further evaluation. Reference: J Am Coll Radiol. 2015 Feb;12(2): 143-50. CTA head: 1. No proximal intracranial large vessel occlusion or high-grade proximal arterial stenosis identified. 2. Atherosclerotic plaque within the intracranial ICAs with no more than mild stenosis. 3. Large right mastoid effusion. Electronically Signed   By: Rockey Childs D.O.   On: 12/18/2023 15:47   CT HEAD CODE STROKE WO CONTRAST Result Date: 12/18/2023 CLINICAL DATA:  Code stroke.  Acute neurological deficit. EXAM: CT HEAD WITHOUT CONTRAST TECHNIQUE: Contiguous axial images were obtained from the base of the skull through the vertex without intravenous contrast. RADIATION DOSE REDUCTION: This exam was performed according to the departmental dose-optimization program which includes automated exposure control, adjustment of the mA and/or kV according to patient size and/or use of iterative reconstruction technique. COMPARISON:  CT the head dated November 09, 2023. FINDINGS: Brain: There is age related cerebral volume loss present. There are chronic lacunar infarcts again demonstrated within the thalamus bilaterally and within the right basal ganglia and internal capsule. There is a chronic lacunar infarct within the left centrum semiovale. There is no evidence of hemorrhage, mass or acute cortical infarct. Vascular: Calcific atheromatous disease within the carotid siphons. Skull: Intact and unremarkable. Sinuses/Orbits: Clear paranasal sinuses.  Normal orbits. Other: None. IMPRESSION: 1. Chronic ischemic changes within the  thalamus bilaterally, right basal ganglia and left centrum semiovale. No apparent acute process. 2. ASPECTS is 10. 3. These results were communicated to Dr. Michaela at 2:49 pm on 12/18/2023 by text page via the [facility] messaging system. Electronically Signed   By: Evalene Coho M.D.   On: 12/18/2023 14:52    EKG: My personal interpretation of EKG shows: Sinus rhythm    Assessment/Plan Principal Problem:   Chest pain Active Problems:   Hypertensive urgency   History of stroke   ESRD on dialysis [facility])    Assessment and Plan:  53 year old W/PMH of stroke with left-sided hemiparesis, ESRD on hemodialysis, HTN who was sent from dialysis center for chest pain and now she had slurred speech being evaluated for both.  1.  Chest pain - Sharp chest pain less likely for coronary syndrome - Troponins have been negative - Echo recently was done and did not show any significant problem - Continue to monitor in telemetry - If patient continues to have chest pain then may consider cardiology evaluation  2.  Slurred speech/rule out stroke - CT scan has been negative - Exam has been at baseline - MRI of the brain has been done pending result - Neurology evaluation appreciated  3.  ESRD on hemodialysis - Continue to hemodialysis per nephrology   4.  Hypertension -  Continue to monitor blood pressure   DVT prophylaxis: SQ Heparin  Code Status: Full Code Family Communication: None available  Disposition Plan: Home  Consults called: Neurology  Admission status: Observation, Med-Surg   Nena Rebel, MD [facility] 12/18/2023, 8:20 PM

## 2023-12-18 NOTE — Code Documentation (Signed)
 Stroke Response Nurse Documentation Code Documentation  Cadee Agro is a 53 y.o. female arriving to [facility] via [facility] on 12/18/2023 with past medical hx of CHF, HTN, renal disorder. On aspirin  81 mg daily. Code stroke was activated by ED.   Patient from [facility] appointment where she was LKW at 1415 and now complaining of slurred speech. Story provided by EMS and primary RN [facility]- [phone] was called for patient at [facility] due to chest pain. EMS noticed patient to have left sided facial droop, left sided weakness and pulled over to assess. EMS encoded to [facility]. Patient with baseline left sided deficits. On evaluation, patient reports only new symptom is slurred speech.   Stroke team at the bedside on patient arrival. Labs drawn and patient cleared for CT by Dr. Willo. Patient to CT with team. NIHSS 5, see documentation for details and code stroke times. Patient with left facial droop, left arm weakness, left leg weakness, and dysarthria  on exam. The following imaging was completed:  CT Head and CTA. Patient is not a candidate for IV Thrombolytic due to too mild to treat, per MD.   Care Plan: every 30 minute NIHSS until outside window at 1845 followed by every 2 hour. Reactivate for worsening symptoms while inside window for thrombolytics. Swallow screen per order.  Process Delays Noted: EMS alerted ED prior to arrival, [facility] not called. [facility] called for activation in ED after arrival.   Bedside handoff with ED RN [facility] RONAL Burnard KANDICE Hershel  Stroke Response RN

## 2023-12-18 NOTE — Progress Notes (Signed)
 CODE STROKE- PHARMACY COMMUNICATION   Time CODE STROKE called/page received: 1445  Time response to CODE STROKE was made (in person or via phone): In person, 1447  Time Stroke Kit retrieved from [facility]is (only if needed): No thrombolytic per neurologist  Name of Provider/Nurse contacted: Dr. Michaela  Past Medical History:  Diagnosis Date   CHF (congestive heart failure) (HCC)    Hypertension    Renal disorder    Prior to Admission medications   Medication Sig Start Date End Date Taking? Authorizing Provider  acetaminophen  (TYLENOL ) 500 MG tablet Take 500 mg by mouth 3 (three) times daily.    [provider]  amLODipine  (NORVASC ) 10 MG tablet Take 10 mg by mouth daily.    [provider]  aspirin  EC 81 MG tablet Take 81 mg by mouth daily.    [provider]  atorvastatin  (LIPITOR) 40 MG tablet Take 40 mg by mouth daily. 07/05/23   [provider]  calcitRIOL  (ROCALTROL ) 0.25 MCG capsule Take 1 capsule (0.25 mcg total) by mouth 4 (four) times a week. Take one capsule by mouth every Mon, Tue, Wed, Fri with dialysis 12/10/23   Dezii, Alexandra, DO  calcium  carbonate (TUMS - DOSED IN MG ELEMENTAL CALCIUM ) 500 MG chewable tablet Chew 2 tablets (400 mg of elemental calcium  total) by mouth 2 (two) times daily. 09/23/23   Awanda City, MD  cinacalcet  (SENSIPAR ) 60 MG tablet Take 60 mg by mouth daily with supper. 11/30/23   [provider]  cloNIDine  (CATAPRES  - DOSED IN MG/24 HR) 0.3 mg/24hr patch Place 1 patch onto the skin once a week. On Fridays    [provider]  cloNIDine  (CATAPRES ) 0.1 MG tablet Take 0.1 mg by mouth 2 (two) times daily as needed (Diastolic BP greater than 100 with manual BP). 10/06/23   [provider]  diclofenac  Sodium (VOLTAREN ) 1 % GEL Apply 2 g topically 2 (two) times daily. Apply to left shoulder.    [provider]  epoetin  alfa (EPOGEN ) 4000 UNIT/ML injection Inject 1 mL (4,000 Units total) into the  vein Every Tuesday,Thursday,and Saturday with dialysis. Patient taking differently: Inject 4,000 Units into the vein 4 (four) times a week. On Mon, Tues, Wed, Fri at dialysis 09/24/23   Awanda City, MD  fluticasone  (FLONASE ) 50 MCG/ACT nasal spray Place 1 spray into both nostrils daily as needed for allergies or rhinitis. 09/23/23   Awanda City, MD  furosemide  (LASIX ) 80 MG tablet Take 1 tablet (80 mg total) by mouth daily as needed for edema or fluid (hypertension). 11/14/23 12/14/23  Bradler, Evan K, MD  gabapentin  (NEURONTIN ) 100 MG capsule Take 200 mg by mouth 3 (three) times daily.    [provider]  hydrALAZINE  (APRESOLINE ) 100 MG tablet Take 100 mg by mouth 3 (three) times daily. 12/26/22   [provider]  irbesartan  (AVAPRO ) 300 MG tablet Take 1 tablet (300 mg total) by mouth every evening. 09/23/23   Awanda City, MD  isosorbide  mononitrate (IMDUR ) 120 MG 24 hr tablet Take 120 mg by mouth daily.    [provider]  meclizine  (ANTIVERT ) 25 MG tablet Take 1 tablet (25 mg total) by mouth 2 (two) times daily as needed for dizziness. 10/27/23   Patel, Sona, MD  melatonin 3 MG TABS tablet Take 6 mg by mouth at bedtime. 12/26/22   [provider]  metoprolol  tartrate (LOPRESSOR ) 50 MG tablet Take 50 mg by mouth 2 (two) times daily. 11/12/23   [provider]  nitroGLYCERIN (NITROSTAT) 0.4 MG SL tablet Place 0.4 mg under the tongue every 5 (five) minutes as needed for chest pain. 05/29/23   [provider]  pantoprazole  (PROTONIX ) 40 MG tablet Take 1 tablet (40 mg total) by mouth daily. 09/24/23   Awanda City, MD  polyethylene glycol (MIRALAX  / GLYCOLAX ) 17 g packet Take 17 g by mouth daily as needed. 09/23/23   Awanda City, MD  senna-docusate (SENOKOT-S) 8.6-50 MG tablet Take 2 tablets by mouth 2 (two) times daily as needed for mild constipation. 09/23/23   Awanda City, MD  sevelamer  carbonate (RENVELA ) 800 MG tablet Take 1,600 mg by mouth 3 (three) times daily.     [provider]  terazosin  (HYTRIN ) 2 MG capsule Take 2 mg by mouth 2 (two) times daily.    [provider]    Will M. Lenon, PharmD Clinical Pharmacist 12/18/2023 3:35 PM

## 2023-12-18 NOTE — ED Provider Notes (Addendum)
 [facility] Provider Note    Event Date/Time   First MD Initiated Contact with Patient 12/18/23 1436     (approximate)   History   Chief Complaint Code Stroke   HPI  Allison Hill is a 53 y.o. female with past medical history of hypertension, CHF, ESRD on HD (MWF), and IDA who presents to the ED complaining of code stroke.  Per EMS, patient initially called out for pain in her chest, but during transport developed left-sided facial droop and weakness in her left arm.  EMS reports that patient did not initially seem to have any neurologic deficits and last known well time was approximately 10 minutes prior to arrival.  Patient does report some dull aching pain in her chest, denies any difficulty breathing.  EMS was told by staff at patient's nursing facility that she had missed dialysis earlier this week but did receive a treatment earlier today.  They were also told that patient has been noncompliant with her blood pressure medications.     Physical Exam   Triage Vital Signs: ED Triage Vitals  Encounter Vitals Group     BP      Girls Systolic BP Percentile      Girls Diastolic BP Percentile      Boys Systolic BP Percentile      Boys Diastolic BP Percentile      Pulse      Resp      Temp      Temp src      SpO2      Weight      Height      Head Circumference      Peak Flow      Pain Score      Pain Loc      Pain Education      Exclude from Growth Chart     Most recent vital signs: Vitals:   12/18/23 1502 12/18/23 1520  BP:  (!) 199/108  Pulse:    Resp:    Temp: 98.3 F (36.8 C)   SpO2:      Constitutional: Alert and oriented. Eyes: Conjunctivae are normal. Head: Atraumatic. Nose: No congestion/rhinnorhea. Mouth/Throat: Mucous membranes are moist.  Cardiovascular: Normal rate, regular rhythm. Grossly normal heart sounds.  2+ radial pulses bilaterally. Respiratory: Normal respiratory effort.  No retractions. Lungs CTAB.  Right IJ  TDC intact. Gastrointestinal: Soft and nontender. No distention. Musculoskeletal: No lower extremity tenderness nor edema.  Neurologic: Slurred speech noted with left-sided facial droop.  4 out of 5 strength in left upper extremity, 5 out of 5 strength in right upper extremity and bilateral lower extremities.    ED Results / Procedures / Treatments   Labs (all labs ordered are listed, but only abnormal results are displayed) Labs Reviewed  APTT - Abnormal; Notable for the following components:      Result Value   aPTT 47 (*)    All other components within normal limits  CBC - Abnormal; Notable for the following components:   RBC 3.39 (*)    Hemoglobin 9.4 (*)    HCT 28.6 (*)    RDW 19.5 (*)    All other components within normal limits  COMPREHENSIVE METABOLIC PANEL WITH GFR - Abnormal; Notable for the following components:   Chloride 95 (*)    BUN 40 (*)    Creatinine, Ser 4.43 (*)    Alkaline Phosphatase 340 (*)    GFR, Estimated 11 (*)  Anion gap 17 (*)    All other components within normal limits  TROPONIN I (HIGH SENSITIVITY) - Abnormal; Notable for the following components:   Troponin I (High Sensitivity) 33 (*)    All other components within normal limits  PROTIME-INR  DIFFERENTIAL  URINE DRUG SCREEN, QUALITATIVE ([facility] ONLY)  ETHANOL  CBG MONITORING, ED   ED ECG REPORT I, Carlin Palin, the attending physician, personally viewed and interpreted this ECG.   Date: 12/18/2023  EKG Time: 15:21  Rate: 80  Rhythm: normal sinus rhythm  Axis: Normal  Intervals:none  ST&T Change: LVH   RADIOLOGY CT head reviewed and interpreted by me with no hemorrhage or midline shift.  PROCEDURES:  Critical Care performed: Yes, see critical care procedure note(s)  .Critical Care  Performed by: Palin Carlin, MD Authorized by: Palin Carlin, MD   Critical care provider statement:    Critical care time (minutes):  30   Critical care time was exclusive of:   Separately billable procedures and treating other patients and teaching time   Critical care was necessary to treat or prevent imminent or life-threatening deterioration of the following conditions:  CNS failure or compromise   Critical care was time spent personally by me on the following activities:  Development of treatment plan with patient or surrogate, discussions with consultants, evaluation of patient's response to treatment, examination of patient, ordering and review of laboratory studies, ordering and review of radiographic studies, ordering and performing treatments and interventions, pulse oximetry, re-evaluation of patient's condition and review of old charts   I assumed direction of critical care for this patient from another provider in my specialty: no      MEDICATIONS ORDERED IN ED: Medications  iohexol  (OMNIPAQUE ) 350 MG/ML injection 100 mL (100 mLs Intravenous Contrast Given 12/18/23 1448)  iohexol  (OMNIPAQUE ) 350 MG/ML injection 50 mL (50 mLs Intravenous Contrast Given 12/18/23 1448)     IMPRESSION / MDM / ASSESSMENT AND PLAN / ED COURSE  I reviewed the triage vital signs and the nursing notes.                              53 y.o. female with a past medical history of hypertension, stroke, CHF, and ESRD on HD (MWF) who presents to the ED initially complaining of chest pain, reportedly developed left-sided facial droop and left arm weakness during transport.  Patient's presentation is most consistent with acute presentation with potential threat to life or bodily function.  Differential diagnosis includes, but is not limited to, stroke, TIA, seizure, anemia, electrolyte abnormality, ACS, PE, dissection, pneumonia, pneumothorax, musculoskeletal pain, GERD, anxiety.  Patient chronically ill-appearing but nontoxic and in no acute distress, vital signs remarkable for hypertension but otherwise reassuring.  Code stroke activated prior to arrival and patient with left-sided  facial droop and left arm weakness.  She was taken immediately to CT scanner, CT head is negative for acute process.  Dr. Michaela of neurology recommends proceeding with CTA of her head and neck, will also check CTA of her chest to rule out dissection given chest pain with neurologic deficit.  Dr. Belvie did further review patient's chart and it appears that she has a history of stroke with similar symptoms, will hold off on TNK.  Labs thus far with known ESRD, no acute electrolyte abnormality or LFT abnormality.  Anemia stable compared to previous with no significant leukocytosis.  Patient turned over to oncoming provider pending additional imaging results.  FINAL CLINICAL IMPRESSION(S) / ED DIAGNOSES   Final diagnoses:  Chest pain, unspecified type  History of stroke     Rx / DC Orders   ED Discharge Orders     None        Note:  This document was prepared using Dragon voice recognition software and may include unintentional dictation errors.   Willo Dunnings, MD 12/18/23 1521    Willo Dunnings, MD 12/18/23 [phone]

## 2023-12-18 NOTE — ED Notes (Signed)
Informed RN bed assigned 

## 2023-12-19 DIAGNOSIS — R079 Chest pain, unspecified: Secondary | ICD-10-CM

## 2023-12-19 DIAGNOSIS — I160 Hypertensive urgency: Secondary | ICD-10-CM | POA: Diagnosis not present

## 2023-12-19 LAB — URINE DRUG SCREEN, QUALITATIVE (ARMC ONLY)
Amphetamines, Ur Screen: NOT DETECTED
Barbiturates, Ur Screen: NOT DETECTED
Benzodiazepine, Ur Scrn: NOT DETECTED
Cannabinoid 50 Ng, Ur ~~LOC~~: NOT DETECTED
Cocaine Metabolite,Ur ~~LOC~~: NOT DETECTED
MDMA (Ecstasy)Ur Screen: NOT DETECTED
Methadone Scn, Ur: NOT DETECTED
Opiate, Ur Screen: NOT DETECTED
Phencyclidine (PCP) Ur S: NOT DETECTED
Tricyclic, Ur Screen: NOT DETECTED

## 2023-12-19 LAB — CBC
HCT: 26.6 % — ABNORMAL LOW (ref 36.0–46.0)
Hemoglobin: 8.8 g/dL — ABNORMAL LOW (ref 12.0–15.0)
MCH: 27.5 pg (ref 26.0–34.0)
MCHC: 33.1 g/dL (ref 30.0–36.0)
MCV: 83.1 fL (ref 80.0–100.0)
Platelets: 186 K/uL (ref 150–400)
RBC: 3.2 MIL/uL — ABNORMAL LOW (ref 3.87–5.11)
RDW: 19.1 % — ABNORMAL HIGH (ref 11.5–15.5)
WBC: 5.9 K/uL (ref 4.0–10.5)
nRBC: 0.3 % — ABNORMAL HIGH (ref 0.0–0.2)

## 2023-12-19 LAB — LIPID PANEL
Cholesterol: 118 mg/dL (ref 0–200)
HDL: 57 mg/dL (ref >40)
LDL Cholesterol: 48 mg/dL (ref 0–99)
Total CHOL/HDL Ratio: 2.1 ratio
Triglycerides: 66 mg/dL (ref <150)
VLDL: 13 mg/dL (ref 0–40)

## 2023-12-19 LAB — MAGNESIUM: Magnesium: 2.2 mg/dL (ref 1.7–2.4)

## 2023-12-19 MED ORDER — CHLORHEXIDINE GLUCONATE CLOTH 2 % EX PADS
6.0000 | MEDICATED_PAD | Freq: Every day | CUTANEOUS | Status: DC
  Administered 2023-12-19: 6 via TOPICAL

## 2023-12-19 NOTE — Evaluation (Signed)
 Occupational Therapy Evaluation Patient Details Name: Allison Hill MRN: [id number] DOB: 12/28/1970 Today's Date: 12/19/2023   History of Present Illness   53 y/o female presented to ED on 12/18/23 for slurred speech and L facial droop. MRI negative. PMH: HTN, CHF, hx of CVA with L sided deficits, ESRD on HD MWF     Clinical Impressions Pt was seen for OT evaluation this date. Prior to hospital admission, pt was living at LTC facility, [facility] where she reports she amb with RW and receives. Pt presents to acute OT demonstrating impaired ADL performance and functional mobility 2/2 (See OT problem list for additional functional deficits). Pt currently requires CGA to come to the EOB from supine position. Pt donned bil socks while sitting on the EOB with setup assistance. Pt STS from the EOB with CGA, amb into hallway with RW + CGA ~126ft. Noted LLE externally rotated during amb, not acute LLE positioning noted from previous visits. Pt often bumps LLE into RW when dragging foot forward, pt amb with a slow gait, no LOB noted. Pt returned to room to rest in her recliner, set up to receive breakfast. Pt would benefit from skilled OT services to address noted impairments and functional limitations (see below for any additional details) in order to maximize safety and independence while minimizing falls risk and caregiver burden. OT will follow acutely.     If plan is discharge home, recommend the following:   A little help with walking and/or transfers;A little help with bathing/dressing/bathroom;Assistance with cooking/housework;Help with stairs or ramp for entrance;Assist for transportation     Functional Status Assessment   Patient has had a recent decline in their functional status and demonstrates the ability to make significant improvements in function in a reasonable and predictable amount of time.     Equipment Recommendations   None recommended by OT     Recommendations  for Other Services         Precautions/Restrictions   Precautions Precautions: Fall Recall of Precautions/Restrictions: Intact Restrictions Weight Bearing Restrictions Per Provider Order: No     Mobility Bed Mobility Overal bed mobility: Needs Assistance Bed Mobility: Supine to Sit     Supine to sit: Contact guard     General bed mobility comments: No physcial assistance to complete    Transfers Overall transfer level: Needs assistance Equipment used: Rolling walker (2 wheels) Transfers: Sit to/from Stand Sit to Stand: Contact guard assist           General transfer comment: Not an acute change - Pt often drags LLE when amb, bumping into RW.      Balance Overall balance assessment: Needs assistance Sitting-balance support: No upper extremity supported, Feet supported Sitting balance-Leahy Scale: Good Sitting balance - Comments: Steady reaching within BOS   Standing balance support: Bilateral upper extremity supported, During functional activity Standing balance-Leahy Scale: Fair Standing balance comment: Improved standing balance with RW                           ADL either performed or assessed with clinical judgement   ADL Overall ADL's : Needs assistance/impaired Eating/Feeding: Set up;Sitting   Grooming: Wash/dry face;Set up;Sitting           Upper Body Dressing : Minimal assistance;Sitting Upper Body Dressing Details (indicate cue type and reason): Donning gown in prep for amb Lower Body Dressing: Set up;Sitting/lateral leans Lower Body Dressing Details (indicate cue type and reason): Donning bil socks while  seated on the EOB Toilet Transfer: Ambulation;Rolling walker (2 wheels);Contact guard assist Toilet Transfer Details (indicate cue type and reason): Simulated         Functional mobility during ADLs: Contact guard assist;Rolling walker (2 wheels) General ADL Comments: Set upA for dressing tasks, CGA simulated toilet transfer      Vision         Perception         Praxis         Pertinent Vitals/Pain Pain Assessment Pain Assessment: Faces Faces Pain Scale: Hurts little more Pain Location: B hips Pain Descriptors / Indicators: Grimacing, Aching Pain Intervention(s): Repositioned, Limited activity within patient's tolerance, Monitored during session     Extremity/Trunk Assessment Upper Extremity Assessment Upper Extremity Assessment: Overall WFL for tasks assessed   Lower Extremity Assessment Lower Extremity Assessment: Defer to PT evaluation;Generalized weakness;LLE deficits/detail LLE Deficits / Details: Baseline L-sided weakness (Pt reports no change in sensation/strength/mobility) LLE Coordination: decreased gross motor   Cervical / Trunk Assessment Cervical / Trunk Assessment: Kyphotic   Communication Communication Communication: Impaired Factors Affecting Communication: Difficulty expressing self (intermittent garbled speech)   Cognition Arousal: Alert Behavior During Therapy: WFL for tasks assessed/performed               OT - Cognition Comments: A/Ox4                 Following commands: Intact       Cueing  General Comments   Cueing Techniques: Verbal cues      Exercises Exercises: Other exercises Other Exercises Other Exercises: Edu: Role of OT eval, safe ADL completion, benefits of continues amb and DME use for safety   Shoulder Instructions      Home Living Family/patient expects to be discharged to:: Skilled nursing facility                                 Additional Comments: LTC at [facility]      Prior Functioning/Environment Prior Level of Function : Needs assist             Mobility Comments: supervision for ambulation with RW at facility ADLs Comments: facility assists with ADLs,  medication management, and meal prep    OT Problem List: Decreased activity tolerance;Impaired balance (sitting and/or standing);Decreased  safety awareness;Decreased knowledge of use of DME or AE   OT Treatment/Interventions: Self-care/ADL training;Therapeutic exercise;Energy conservation;DME and/or AE instruction      OT Goals(Current goals can be found in the care plan section)   Acute Rehab OT Goals Patient Stated Goal: Return to facility OT Goal Formulation: With patient Time For Goal Achievement: 01/02/24 Potential to Achieve Goals: Good ADL Goals Pt Will Perform Grooming: with modified independence;sitting Pt Will Perform Lower Body Dressing: with modified independence;sit to/from stand Pt Will Transfer to Toilet: with modified independence;regular height toilet Pt Will Perform Toileting - Clothing Manipulation and hygiene: with modified independence;sit to/from stand   OT Frequency:  Min 1X/week    Co-evaluation              AM-PAC OT 6 Clicks Daily Activity     Outcome Measure Help from another person eating meals?: None Help from another person taking care of personal grooming?: None Help from another person toileting, which includes using toliet, bedpan, or urinal?: A Little Help from another person bathing (including washing, rinsing, drying)?: A Little Help from another person to put on and taking  off regular upper body clothing?: None Help from another person to put on and taking off regular lower body clothing?: None 6 Click Score: 22   End of Session Equipment Utilized During Treatment: Gait belt;Rolling walker (2 wheels) Nurse Communication: Mobility status  Activity Tolerance: Patient tolerated treatment well Patient left: in chair;with call bell/phone within reach;with chair alarm set  OT Visit Diagnosis: Unsteadiness on feet (R26.81);Other abnormalities of gait and mobility (R26.89);Muscle weakness (generalized) (M62.81)                Time: 9161-9098 OT Time Calculation (min): 23 min Charges:  OT General Charges $OT Visit: 1 Visit OT Evaluation $OT Eval Moderate Complexity: 1  Mod  Desiree Fleming M.S. OTR/L  12/19/23, 10:25 AM

## 2023-12-19 NOTE — Progress Notes (Signed)
 Called [facility] at [phone] and gave report to Barnie Fonder LPN  going to room F16

## 2023-12-19 NOTE — Progress Notes (Signed)
 SLP Cancellation Note  Patient Details Name: Allison Hill MRN: [id number] DOB: 27-May-1971   Cancelled treatment:       Reason Eval/Treat Not Completed: SLP screened, no needs identified, will sign off  Pt screened for primary concern of slurred speech. Per neurology note, She states that she does not typically slur her speech this much. MRI 12/18/23 revealing, No acute intracranial abnormality. Innumerable chronic microhemorrhages within the cerebellum, brainstem, and central supratentorial brain, consistent with chronic hypertensive angiopathy. Chronic small vessel ischemia  and mild volume loss. Last screen completed on 09/12/23 by this SLP, when pt reported slurred speech comes and goes.   Today, pt reports continued fluctuation in speech, ex: more slurred speech following dialysis and when blood pressure is high. At this time, pt reports speech is at baseline. Pt denied need for SLP assessment and requested to rest.   No acute SLP services indicated. RN and MD aware.  [place] Emmy Keng Clapp, MS, CCC-SLP Speech Language Pathologist Rehab Services; [facility] [phone] (ascom)   [place] J Clapp 12/19/2023, 10:44 AM

## 2023-12-19 NOTE — Progress Notes (Signed)
 MD order received in [facility] to discharge pt back to SNF/[facility]; TOC previously arranged transport with [facility] services; AVS printed and placed in discharge envelope for EMS to take with them to the facility; discharge pending arrival of EMS for nonemergency transport

## 2023-12-19 NOTE — Discharge Summary (Addendum)
 Physician Discharge Summary   Patient: Allison Hill MRN: [id number] DOB: 29-Dec-1970  Admit date:     12/18/2023  Discharge date: 12/19/23  Discharge Physician: Drue ONEIDA Potter   PCP: [facility]   Recommendations at discharge:  Follow-up with PCP  Discharge Diagnoses:  1.  Chest pain-resolved 2.  Slurred speech/ruled out stroke 3.  ESRD on hemodialysis 4.  Hypertension    Hospital Course: Allison Hill is a pleasant 53 y.o. female with medical history significant for history of stroke, chronic left-sided weakness, CHF, HTN, ESRD on hemodialysis Monday Wednesday and Friday brought in for substernal chest pain that happened while she was on dialysis.  Patient is stated that while she was on dialysis and she had severe chest pain, says it was sharp, 9/10 in intensity, nonradiating.  She was brought in for chest pain but while she was coming in, she had a slurring speech and worsening left-sided weakness and code stroke was called.  On evaluation upon arrival she had left hemiparesis however patient is stated that that is her baseline.  Neurology evaluated the patient and advised that due to dysarthria MRI is not unreasonable if negative would not pursue any further testing at this time. MRI of the brain which did not show any acute intracranial pathology.  Patient's symptoms resolved and have therefore been cleared for discharge today to follow-up with her primary care physician.   Consultants: Neurology Procedures performed: None Disposition: Skilled nursing facility Diet recommendation:  Cardiac diet DISCHARGE MEDICATION: Allergies as of 12/19/2023       Reactions   Ceftriaxone Shortness Of Breath, Hives   Allergy, hives   Shellfish Allergy Hives   Minoxidil Swelling        Medication List     STOP taking these medications    furosemide  80 MG tablet Commonly known as: Lasix        TAKE these medications    acetaminophen  500 MG tablet Commonly known as:  TYLENOL  Take 500 mg by mouth 3 (three) times daily.   amLODipine  10 MG tablet Commonly known as: NORVASC  Take 10 mg by mouth daily.   aspirin  EC 81 MG tablet Take 81 mg by mouth daily.   atorvastatin  40 MG tablet Commonly known as: LIPITOR Take 40 mg by mouth daily.   calcitRIOL  0.25 MCG capsule Commonly known as: ROCALTROL  Take 1 capsule (0.25 mcg total) by mouth 4 (four) times a week. Take one capsule by mouth every Mon, Tue, Wed, Fri with dialysis   calcium  carbonate 500 MG chewable tablet Commonly known as: TUMS - dosed in mg elemental calcium  Chew 2 tablets (400 mg of elemental calcium  total) by mouth 2 (two) times daily.   cinacalcet  60 MG tablet Commonly known as: SENSIPAR  Take 60 mg by mouth daily with supper.   cloNIDine  0.1 MG tablet Commonly known as: CATAPRES  Take 0.1 mg by mouth 2 (two) times daily as needed (Diastolic BP greater than 100 with manual BP).   cloNIDine  0.3 mg/24hr patch Commonly known as: CATAPRES  - Dosed in mg/24 hr Place 1 patch onto the skin once a week. On Fridays   epoetin  alfa 4000 UNIT/ML injection Commonly known as: EPOGEN  Inject 1 mL (4,000 Units total) into the vein Every Tuesday,Thursday,and Saturday with dialysis. What changed:  when to take this additional instructions   fluticasone  50 MCG/ACT nasal spray Commonly known as: FLONASE  Place 1 spray into both nostrils daily as needed for allergies or rhinitis.   gabapentin  100 MG capsule Commonly known as:  NEURONTIN  Take 200 mg by mouth 3 (three) times daily.   hydrALAZINE  100 MG tablet Commonly known as: APRESOLINE  Take 100 mg by mouth 3 (three) times daily.   irbesartan  300 MG tablet Commonly known as: AVAPRO  Take 1 tablet (300 mg total) by mouth every evening.   isosorbide  mononitrate 120 MG 24 hr tablet Commonly known as: IMDUR  Take 120 mg by mouth daily.   meclizine  25 MG tablet Commonly known as: ANTIVERT  Take 1 tablet (25 mg total) by mouth 2 (two) times daily  as needed for dizziness.   melatonin 3 MG Tabs tablet Take 6 mg by mouth at bedtime.   metoprolol  tartrate 50 MG tablet Commonly known as: LOPRESSOR  Take 50 mg by mouth 2 (two) times daily.   nitroGLYCERIN  0.4 MG SL tablet Commonly known as: NITROSTAT  Place 0.4 mg under the tongue every 5 (five) minutes as needed for chest pain.   pantoprazole  40 MG tablet Commonly known as: PROTONIX  Take 1 tablet (40 mg total) by mouth daily.   polyethylene glycol 17 g packet Commonly known as: MIRALAX  / GLYCOLAX  Take 17 g by mouth daily as needed.   senna-docusate 8.6-50 MG tablet Commonly known as: Senokot-S Take 2 tablets by mouth 2 (two) times daily as needed for mild constipation.   sevelamer  carbonate 800 MG tablet Commonly known as: RENVELA  Take 1,600 mg by mouth 3 (three) times daily.   terazosin  2 MG capsule Commonly known as: HYTRIN  Take 2 mg by mouth 2 (two) times daily.   Voltaren  1 % Gel Generic drug: diclofenac  Sodium Apply 2 g topically 2 (two) times daily. Apply to left shoulder.        Follow-up Information     [facility] [address] Follow up.   Why: Hospital follow up Contact information: [address] [place] [address] [phone]                Discharge Exam: Constitutional: Alert, awake, calm, comfortable HEENT: Neck supple Respiratory: Clear to auscultation B/L, no wheezing, no rales.  Cardiovascular: Regular rate and rhythm, no murmurs / rubs / gallops. No extremity edema. 2+ pedal pulses. No carotid bruits.  Abdomen: Soft, no tenderness, Bowel sounds positive.  Musculoskeletal: no clubbing / cyanosis. Good ROM, no contractures. Normal muscle tone.  Skin: no rashes, lesions, ulcers. Neurologic: CN 2-12 grossly intact. Sensation intact, mild slurring of his speech, left-sided weakness Psychiatric: Alert and oriented x 3. Normal mood.  Condition at discharge: good  The results of significant diagnostics from this hospitalization  (including imaging, microbiology, ancillary and laboratory) are listed below for reference.   Imaging Studies: MR BRAIN WO CONTRAST Result Date: 12/18/2023 EXAM: MRI BRAIN WITHOUT CONTRAST 12/18/2023 07:58:00 PM TECHNIQUE: Multiplanar multisequence MRI of the head/brain was performed without the administration of intravenous contrast. COMPARISON: 11/09/2023 CLINICAL HISTORY: Neuro deficit, acute, stroke suspected. FINDINGS: BRAIN AND VENTRICLES: No acute infarct. No acute intracranial hemorrhage. No mass. No midline shift. No hydrocephalus. The sella is unremarkable. Normal flow voids. Innumerable chronic microhemorrhages within the cerebellum, brainstem and central supratentorial brain in a pattern most consistent with chronic hypertensive angiopathy. Early confluent chronic white matter hyperintensities. Mild volume loss. ORBITS: No acute abnormality. SINUSES AND MASTOIDS: Right mastoid effusion with fluid in the right petrous apex. BONES AND SOFT TISSUES: Normal marrow signal. No acute soft tissue abnormality. No visible lesion in the nasopharynx. IMPRESSION: 1. No acute intracranial abnormality. 2. Innumerable chronic microhemorrhages within the cerebellum, brainstem, and central supratentorial brain, consistent with chronic hypertensive angiopathy. 3. Chronic  small vessel ischemia  and mild volume loss. Electronically signed by: Franky Stanford MD 12/18/2023 08:49 PM EDT RP Workstation: [id number]   CT Angio Chest/Abd/Pel for Dissection W and/or W/WO Result Date: 12/18/2023 CLINICAL DATA:  Chest pain EXAM: CT ANGIOGRAPHY CHEST, ABDOMEN AND PELVIS TECHNIQUE: Non-contrast CT of the chest was initially obtained. Multidetector CT imaging through the chest, abdomen and pelvis was performed using the standard protocol during bolus administration of intravenous contrast. Multiplanar reconstructed images and MIPs were obtained and reviewed to evaluate the vascular anatomy. RADIATION DOSE REDUCTION: This exam was  performed according to the departmental dose-optimization program which includes automated exposure control, adjustment of the mA and/or kV according to patient size and/or use of iterative reconstruction technique. CONTRAST:  OMNIPAQUE  IOHEXOL  350 MG/ML SOLN COMPARISON:  CTA chest dated 11/27/2023 FINDINGS: CTA CHEST FINDINGS Cardiovascular: Right IJ central venous catheter terminates at the superior cavoatrial junction. Preferential opacification of the thoracic aorta. No evidence of thoracic aortic aneurysm or dissection. Normal heart size. No pericardial effusion. No central pulmonary emboli. Small volume fluid within the pericardial recesses. Coronary artery calcifications. Multichamber cardiomegaly. Mediastinum/Nodes: Imaged thyroid gland without nodules meeting criteria for imaging follow-up by size. Normal esophagus. No pathologically enlarged axillary, supraclavicular, mediastinal, or hilar lymph nodes. Lungs/Pleura: The central airways are patent. No focal consolidation. No pneumothorax. No pleural effusion. Musculoskeletal: No acute or abnormal lytic or blastic osseous lesions. Review of the MIP images confirms the above findings. CTA ABDOMEN AND PELVIS FINDINGS VASCULAR Aorta: Aortic atherosclerosis. Normal caliber aorta and branch vessels without aneurysm, dissection, vasculitis or significant stenosis. No active extravasation. Conventional celiac artery branching pattern. Single bilateral renal arteries. Veins: No obvious venous abnormality within the limitations of this arterial phase study. Review of the MIP images confirms the above findings. NON-VASCULAR Hepatobiliary: Subcentimeter arterially enhancing focus within segment 8 (5:94), likely a flash filling venous malformation. In no intra or extrahepatic biliary ductal dilation. Normal gallbladder. Pancreas: No focal lesions or main ductal dilation. Spleen: Normal in size without focal abnormality. Adrenals/Urinary Tract: No adrenal nodules.  Mildly atrophic kidneys. Bilateral subcentimeter hypodensities, too small to characterize but likely cyst. No hydronephrosis or calculi. No focal bladder wall thickening. Stomach/Bowel: Normal appearance of the stomach. No evidence of bowel wall thickening or inflammatory changes. Dilated rectum contains large volume stool. Moderate volume stool throughout the remainder of the colon. Colonic diverticulosis without acute diverticulitis. Normal appendix. Lymphatic: No enlarged abdominal or pelvic lymph nodes. Reproductive: No adnexal masses. Other: No free fluid, fluid collection, or free air. Musculoskeletal: No acute or abnormal lytic or blastic osseous lesions. Review of the MIP images confirms the above findings. IMPRESSION: 1. No evidence of aortic aneurysm or dissection. 2. Dilated rectum contains large volume stool, which can be seen in the setting of fecal impaction. 3. Colonic diverticulosis without acute diverticulitis. 4. Multichamber cardiomegaly. 5. Aortic Atherosclerosis (ICD10-I70.0). Coronary artery calcifications. Assessment for potential risk factor modification, dietary therapy or pharmacologic therapy may be warranted, if clinically indicated. Electronically Signed   By: Limin  Xu M.D.   On: 12/18/2023 15:48   CT ANGIO HEAD NECK W WO CM (CODE STROKE) Result Date: 12/18/2023 CLINICAL DATA:  Provided history: Neuro deficit, acute, stroke suspected. EXAM: CT ANGIOGRAPHY HEAD AND NECK WITH AND WITHOUT CONTRAST TECHNIQUE: Multidetector CT imaging of the head and neck was performed using the standard protocol during bolus administration of intravenous contrast. Multiplanar CT image reconstructions and MIPs were obtained to evaluate the vascular anatomy. Carotid stenosis measurements (when applicable) are obtained utilizing  NASCET criteria, using the distal internal carotid diameter as the denominator. RADIATION DOSE REDUCTION: This exam was performed according to the departmental dose-optimization  program which includes automated exposure control, adjustment of the mA and/or kV according to patient size and/or use of iterative reconstruction technique. CONTRAST:  50mL OMNIPAQUE  IOHEXOL  350 MG/ML SOLN, OMNIPAQUE  IOHEXOL  350 MG/ML SOLN COMPARISON:  Noncontrast head CT performed earlier today 12/18/2023. Brain MRI 11/09/2023. CT angiogram head/neck 11/09/2023. FINDINGS: CTA NECK FINDINGS Aortic arch: Standard aortic branching. A sclerotic plaque within the visualized aortic arch and proximal major branch vessels of the neck. No hemodynamically significant innominate or proximal subclavian artery stenosis. Right carotid system: CCA and ICA patent within the neck without stenosis. Minimal atherosclerotic plaque about the carotid bifurcation. Tortuosity and partially retropharyngeal course of the cervical ICA. Left carotid system: CCA and ICA patent within the neck without stenosis or significant atherosclerotic disease. Tortuosity and partially retropharyngeal course of the cervical ICA Vertebral arteries: Codominant and patent within the neck without stenosis or significant atherosclerotic disease. Skeleton: Nonspecific reversal of the expected cervical lordosis. Slight grade 1 anterolisthesis at C2-C3, C3-C4, C4-C5, C5-C6 and T1-T2. Cervical spondylosis. No acute fracture or aggressive osseous lesion. Other neck: Mildly enlarged and heterogeneous thyroid gland. Upper chest: No consolidation within the imaged lung apices. Partially visualized right-sided central venous catheter. Review of the MIP images confirms the above findings CTA HEAD FINDINGS Anterior circulation: The intracranial internal carotid arteries are patent. Atherosclerotic plaque within both vessels with no more than mild stenosis. The M1 middle cerebral arteries are patent. No M2 proximal branch occlusion or high-grade proximal stenosis. The anterior cerebral arteries are patent. Unchanged moderate stenosis within the left anterior  cerebral artery A3 segment (series 12, image 21). No intracranial aneurysm is identified. Posterior circulation: The intracranial vertebral arteries are patent. The basilar artery is patent. The posterior cerebral arteries are patent. Posterior communicating arteries are diminutive or absent, bilaterally. Venous sinuses: Within the limitations of contrast timing, no convincing thrombus. Anatomic variants: As described. Other: Large right mastoid effusion. Review of the MIP images confirms the above findings No emergent large vessel occlusion identified. These results were communicated to Dr. Michaela at 3:47 pmon 7/18/2025by text page via the [facility] messaging system. IMPRESSION: CTA neck: 1. The common carotid and internal carotid arteries are patent within the neck without stenosis. Minimal atherosclerotic plaque about the right carotid bifurcation. 2. The vertebral arteries are patent within the neck without stenosis or significant atherosclerotic disease. 3. Aortic Atherosclerosis (ICD10-I70.0). 4. Mildly enlarged and heterogeneous thyroid gland. A non-emergent thyroid ultrasound is recommended for further evaluation. Reference: J Am Coll Radiol. 2015 Feb;12(2): 143-50. CTA head: 1. No proximal intracranial large vessel occlusion or high-grade proximal arterial stenosis identified. 2. Atherosclerotic plaque within the intracranial ICAs with no more than mild stenosis. 3. Large right mastoid effusion. Electronically Signed   By: Rockey Childs D.O.   On: 12/18/2023 15:47   CT HEAD CODE STROKE WO CONTRAST Result Date: 12/18/2023 CLINICAL DATA:  Code stroke.  Acute neurological deficit. EXAM: CT HEAD WITHOUT CONTRAST TECHNIQUE: Contiguous axial images were obtained from the base of the skull through the vertex without intravenous contrast. RADIATION DOSE REDUCTION: This exam was performed according to the departmental dose-optimization program which includes automated exposure control, adjustment of the mA and/or  kV according to patient size and/or use of iterative reconstruction technique. COMPARISON:  CT the head dated November 09, 2023. FINDINGS: Brain: There is age related cerebral volume loss present. There are chronic lacunar  infarcts again demonstrated within the thalamus bilaterally and within the right basal ganglia and internal capsule. There is a chronic lacunar infarct within the left centrum semiovale. There is no evidence of hemorrhage, mass or acute cortical infarct. Vascular: Calcific atheromatous disease within the carotid siphons. Skull: Intact and unremarkable. Sinuses/Orbits: Clear paranasal sinuses.  Normal orbits. Other: None. IMPRESSION: 1. Chronic ischemic changes within the thalamus bilaterally, right basal ganglia and left centrum semiovale. No apparent acute process. 2. ASPECTS is 10. 3. These results were communicated to Dr. Michaela at 2:49 pm on 12/18/2023 by text page via the [facility] messaging system. Electronically Signed   By: Evalene Coho M.D.   On: 12/18/2023 14:52   ECHOCARDIOGRAM COMPLETE Result Date: 12/08/2023    ECHOCARDIOGRAM REPORT   Patient Name:   HANNELORE BOVA Date of Exam: 12/08/2023 Medical Rec #:  [id number]        Height:       63.0 in Accession #:    [id number]       Weight:       196.6 lb Date of Birth:  January 20, 1971        BSA:          1.920 m Patient Age:    53 years         BP:           149/95 mmHg Patient Gender: F                HR:           73 bpm. Exam Location:  [facility] Procedure: 2D Echo, Color Doppler and Cardiac Doppler (Both Spectral and Color            Flow Doppler were utilized during procedure). Indications:     CHF-acute systolic I50.21  History:         Patient has no prior history of Echocardiogram examinations.                  CHF; Risk Factors:Hypertension.  Sonographer:     Christopher Furnace Referring Phys:  [id number] AVA SWAYZE Diagnosing Phys: Deatrice Cage MD IMPRESSIONS  1. Left ventricular ejection fraction, by estimation, is 55 to 60%. The left ventricle  has normal function. The left ventricle has no regional wall motion abnormalities. There is moderate left ventricular hypertrophy. Left ventricular diastolic parameters are indeterminate.  2. Right ventricular systolic function is normal. The right ventricular size is normal. Tricuspid regurgitation signal is inadequate for assessing PA pressure.  3. Left atrial size was moderately dilated.  4. The mitral valve is normal in structure. Moderate mitral valve regurgitation. No evidence of mitral stenosis.  5. The aortic valve is normal in structure. Aortic valve regurgitation is trivial. No aortic stenosis is present.  6. The inferior vena cava is normal in size with greater than 50% respiratory variability, suggesting right atrial pressure of 3 mmHg. FINDINGS  Left Ventricle: Left ventricular ejection fraction, by estimation, is 55 to 60%. The left ventricle has normal function. The left ventricle has no regional wall motion abnormalities. The left ventricular internal cavity size was normal in size. There is  moderate left ventricular hypertrophy. Left ventricular diastolic parameters are indeterminate. Right Ventricle: The right ventricular size is normal. No increase in right ventricular wall thickness. Right ventricular systolic function is normal. Tricuspid regurgitation signal is inadequate for assessing PA pressure. The tricuspid regurgitant velocity is 2.20 m/s, and with an assumed right atrial pressure of 3 mmHg, the estimated right ventricular  systolic pressure is 22.4 mmHg. Left Atrium: Left atrial size was moderately dilated. Right Atrium: Right atrial size was normal in size. Pericardium: There is no evidence of pericardial effusion. Mitral Valve: The mitral valve is normal in structure. Moderate mitral valve regurgitation. No evidence of mitral valve stenosis. MV peak gradient, 7.7 mmHg. The mean mitral valve gradient is 3.0 mmHg. Tricuspid Valve: The tricuspid valve is normal in structure. Tricuspid  valve regurgitation is trivial. No evidence of tricuspid stenosis. Aortic Valve: The aortic valve is normal in structure. Aortic valve regurgitation is trivial. No aortic stenosis is present. Aortic valve mean gradient measures 4.0 mmHg. Aortic valve peak gradient measures 7.1 mmHg. Aortic valve area, by VTI measures 2.18 cm. Pulmonic Valve: The pulmonic valve was normal in structure. Pulmonic valve regurgitation is not visualized. No evidence of pulmonic stenosis. Aorta: The aortic root is normal in size and structure. Venous: The inferior vena cava is normal in size with greater than 50% respiratory variability, suggesting right atrial pressure of 3 mmHg. IAS/Shunts: No atrial level shunt detected by color flow Doppler.  LEFT VENTRICLE PLAX 2D LVIDd:         4.73 cm   Diastology LVIDs:         3.46 cm   LV e' medial:    10.20 cm/s LV PW:         2.32 cm   LV E/e' medial:  10.4 LV IVS:        1.65 cm   LV e' lateral:   8.61 cm/s LVOT diam:     2.10 cm   LV E/e' lateral: 12.3 LV SV:         64 LV SV Index:   33 LVOT Area:     3.46 cm  RIGHT VENTRICLE RV Basal diam:  4.30 cm RV Mid diam:    3.14 cm LEFT ATRIUM              Index        RIGHT ATRIUM           Index LA diam:        4.20 cm  2.19 cm/m   RA Area:     13.90 cm LA Vol (A2C):   170.0 ml 88.55 ml/m  RA Volume:   33.30 ml  17.35 ml/m LA Vol (A4C):   102.0 ml 53.13 ml/m LA Biplane Vol: 138.0 ml 71.88 ml/m  AORTIC VALVE AV Area (Vmax):    2.25 cm AV Area (Vmean):   2.11 cm AV Area (VTI):     2.18 cm AV Vmax:           133.00 cm/s AV Vmean:          97.700 cm/s AV VTI:            0.293 m AV Peak Grad:      7.1 mmHg AV Mean Grad:      4.0 mmHg LVOT Vmax:         86.50 cm/s LVOT Vmean:        59.400 cm/s LVOT VTI:          0.184 m LVOT/AV VTI ratio: 0.63  AORTA Ao Root diam: 3.10 cm MITRAL VALVE                TRICUSPID VALVE MV Area (PHT): 4.21 cm     TR Peak grad:   19.4 mmHg MV Area VTI:   1.92 cm     TR Vmax:  220.00 cm/s MV Peak grad:  7.7  mmHg MV Mean grad:  3.0 mmHg     SHUNTS MV Vmax:       1.39 m/s     Systemic VTI:  0.18 m MV Vmean:      78.0 cm/s    Systemic Diam: 2.10 cm MV Decel Time: 180 msec MV E velocity: 106.00 cm/s MV A velocity: 62.10 cm/s MV E/A ratio:  1.71 Deatrice Cage MD Electronically signed by Deatrice Cage MD Signature Date/Time: 12/08/2023/1:57:48 PM    Final    DG Chest 1 View Result Date: 12/06/2023 CLINICAL DATA:  Shortness of breath. Dialysis was last Friday. Dizziness and hypertension. EXAM: CHEST  1 VIEW COMPARISON:  11/27/2023 FINDINGS: Right chest wall dialysis catheter is noted with tips at the superior cavoatrial junction. Cardiac enlargement. Aortic atherosclerotic calcifications. Pulmonary vascular congestion. No frank edema or pleural effusion. No airspace consolidation. Visualized osseous structures are unremarkable. IMPRESSION: Cardiac enlargement and pulmonary vascular congestion. Electronically Signed   By: Waddell Calk M.D.   On: 12/06/2023 07:16   CT Angio Chest PE W/Cm &/Or Wo Cm Result Date: 11/27/2023 CLINICAL DATA:  Pulmonary embolism (PE) suspected, low to intermediate prob, positive D-dimer EXAM: CT ANGIOGRAPHY CHEST WITH CONTRAST TECHNIQUE: Multidetector CT imaging of the chest was performed using the standard protocol during bolus administration of intravenous contrast. Multiplanar CT image reconstructions and MIPs were obtained to evaluate the vascular anatomy. RADIATION DOSE REDUCTION: This exam was performed according to the departmental dose-optimization program which includes automated exposure control, adjustment of the mA and/or kV according to patient size and/or use of iterative reconstruction technique. CONTRAST:  OMNIPAQUE  IOHEXOL  350 MG/ML SOLN COMPARISON:  Chest x-ray 11/27/2023 FINDINGS: Cardiovascular: Right chest wall dialysis catheter with tip within the right atrium. Satisfactory opacification of the pulmonary arteries to the segmental level. No evidence of pulmonary  embolism. Enlarged heart size. No significant pericardial effusion. The thoracic aorta is normal in caliber. Mild-to-moderate atherosclerotic plaque of the thoracic aorta. At least 3 vessel coronary artery calcifications. Mild aortic valve leaflet calcification. Mediastinum/Nodes: No enlarged mediastinal, hilar, or axillary lymph nodes. Thyroid gland, trachea, and esophagus demonstrate no significant findings. Lungs/Pleura: Bilateral lower lobe atelectasis. No focal consolidation. No pulmonary nodule. No pulmonary mass. No pleural effusion. No pneumothorax. Upper Abdomen: No acute abnormality. Musculoskeletal: No chest wall abnormality. No suspicious lytic or blastic osseous lesions. No acute displaced fracture. Multilevel degenerative changes of the spine. Review of the MIP images confirms the above findings. IMPRESSION: 1. No pulmonary embolism. 2. No acute intrathoracic abnormality. 3. Cardiomegaly. 4. Aortic Atherosclerosis (ICD10-I70.0) including at least 3 vessel coronary calcification and aortic valve leaflet calcifications-correlate for aortic stenosis. Electronically Signed   By: Morgane  Naveau M.D.   On: 11/27/2023 03:26   DG Chest 1 View Result Date: 11/27/2023 CLINICAL DATA:  Chest pain EXAM: CHEST  1 VIEW COMPARISON:  10/24/2023 FINDINGS: Lungs are clear. No pneumothorax or pleural effusion. Right internal jugular hemodialysis catheter tip seen within the right atrium. Stable mild cardiomegaly. Pulmonary vascularity is normal. No acute bone abnormality. IMPRESSION: 1. No active disease. 2. Stable mild cardiomegaly. Electronically Signed   By: Dorethia Molt M.D.   On: 11/27/2023 01:55    Microbiology: Results for orders placed or performed during the hospital encounter of 12/06/23  Resp panel by RT-PCR (RSV, Flu A&B, Covid) Anterior Nasal Swab     Status: None   Collection Time: 12/06/23  7:00 AM   Specimen: Anterior Nasal Swab  Result Value Ref  Range Status   SARS Coronavirus 2 by RT PCR  NEGATIVE NEGATIVE Final    Comment: (NOTE) SARS-CoV-2 target nucleic acids are NOT DETECTED.  The SARS-CoV-2 RNA is generally detectable in upper respiratory specimens during the acute phase of infection. The lowest concentration of SARS-CoV-2 viral copies this assay can detect is 138 copies/mL. A negative result does not preclude SARS-Cov-2 infection and should not be used as the sole basis for treatment or other patient management decisions. A negative result may occur with  improper specimen collection/handling, submission of specimen other than nasopharyngeal swab, presence of viral mutation(s) within the areas targeted by this assay, and inadequate number of viral copies(<138 copies/mL). A negative result must be combined with clinical observations, patient history, and epidemiological information. The expected result is Negative.  Fact Sheet for Patients:  BloggerCourse.com  Fact Sheet for Healthcare Providers:  SeriousBroker.it  This test is no t yet approved or cleared by the [place]  FDA and  has been authorized for detection and/or diagnosis of SARS-CoV-2 by FDA under an Emergency Use Authorization (EUA). This EUA will remain  in effect (meaning this test can be used) for the duration of the COVID-19 declaration under Section 564(b)(1) of the Act, 21 U.S.C.section 360bbb-3(b)(1), unless the authorization is terminated  or revoked sooner.       Influenza A by PCR NEGATIVE NEGATIVE Final   Influenza B by PCR NEGATIVE NEGATIVE Final    Comment: (NOTE) The Xpert Xpress SARS-CoV-2/FLU/RSV plus assay is intended as an aid in the diagnosis of influenza from Nasopharyngeal swab specimens and should not be used as a sole basis for treatment. Nasal washings and aspirates are unacceptable for Xpert Xpress SARS-CoV-2/FLU/RSV testing.  Fact Sheet for Patients: BloggerCourse.com  Fact Sheet for  Healthcare Providers: SeriousBroker.it  This test is not yet approved or cleared by the [place]  FDA and has been authorized for detection and/or diagnosis of SARS-CoV-2 by FDA under an Emergency Use Authorization (EUA). This EUA will remain in effect (meaning this test can be used) for the duration of the COVID-19 declaration under Section 564(b)(1) of the Act, 21 U.S.C. section 360bbb-3(b)(1), unless the authorization is terminated or revoked.     Resp Syncytial Virus by PCR NEGATIVE NEGATIVE Final    Comment: (NOTE) Fact Sheet for Patients: BloggerCourse.com  Fact Sheet for Healthcare Providers: SeriousBroker.it  This test is not yet approved or cleared by the [place]  FDA and has been authorized for detection and/or diagnosis of SARS-CoV-2 by FDA under an Emergency Use Authorization (EUA). This EUA will remain in effect (meaning this test can be used) for the duration of the COVID-19 declaration under Section 564(b)(1) of the Act, 21 U.S.C. section 360bbb-3(b)(1), unless the authorization is terminated or revoked.  Performed at [facility], [address] [place], [place] [address]     Labs: CBC: Recent Labs  Lab 12/18/23 1445 12/19/23 0423  WBC 6.1 5.9  NEUTROABS 4.0  --   HGB 9.4* 8.8*  HCT 28.6* 26.6*  MCV 84.4 83.1  PLT 182 186   Basic Metabolic Panel: Recent Labs  Lab 12/18/23 1445 12/18/23 2037 12/19/23 0423  NA 138 136  --   K 3.5 3.9  --   CL 95* 94*  --   CO2 26 24  --   GLUCOSE 99 72  --   BUN 40* 47*  --   CREATININE 4.43* 5.10*  --   CALCIUM  9.9 9.6  --   MG  --   --  2.2   Liver Function Tests: Recent Labs  Lab 12/18/23 1445 12/18/23 2037  AST 22 17  ALT 9 9  ALKPHOS 340* 371*  BILITOT 0.5 0.6  PROT 8.0 8.5*  ALBUMIN 3.9 4.2   CBG: Recent Labs  Lab 12/18/23 1435  GLUCAP 97    Discharge time spent:  38  minutes.  Signed: Drue ONEIDA Potter, MD [facility] 12/19/2023

## 2023-12-19 NOTE — Evaluation (Signed)
 Physical Therapy Evaluation Patient Details Name: Allison Hill MRN: [id number] DOB: [birth year] Today's Date: 12/19/2023  History of Present Illness  53 y/o female presented to ED on 12/18/23 for slurred speech and L facial droop. MRI negative. PMH: HTN, CHF, hx of CVA with L sided deficits, ESRD on HD MWF  Clinical Impression  Patient admitted with the above. PTA, patient is at LTC at [facility] and is ambulatory with RW with supervision at facility. Patient currently presents with baseline L sided weakness, impaired balance, and decreased activity tolerance. Patient required CGA for bed mobility and sit to stand from low bed surface. Ambulated 100' with RW and CGA with L LE externally rotated and bumping foot into RW intermittently. Patient is a high fall risk. Patient will benefit from skilled PT services during acute stay to address listed deficits. Patient will benefit from ongoing therapy at discharge to maximize functional independence and safety.         If plan is discharge home, recommend the following: A little help with walking and/or transfers;A little help with bathing/dressing/bathroom;Assistance with cooking/housework;Direct supervision/assist for medications management;Direct supervision/assist for financial management;Assist for transportation;Help with stairs or ramp for entrance   Can travel by private vehicle   Yes    Equipment Recommendations None recommended by PT  Recommendations for Other Services       Functional Status Assessment Patient has had a recent decline in their functional status and demonstrates the ability to make significant improvements in function in a reasonable and predictable amount of time.     Precautions / Restrictions Precautions Precautions: Fall Recall of Precautions/Restrictions: Intact Restrictions Weight Bearing Restrictions Per Provider Order: No      Mobility  Bed Mobility Overal bed mobility: Needs Assistance Bed Mobility:  Supine to Sit     Supine to sit: Contact guard          Transfers Overall transfer level: Needs assistance Equipment used: Rolling Lamount Bankson (2 wheels) Transfers: Sit to/from Stand Sit to Stand: Contact guard assist                Ambulation/Gait Ambulation/Gait assistance: Contact guard assist Gait Distance (Feet): 100 Feet Assistive device: Rolling Omesha Bowerman (2 wheels) Gait Pattern/deviations: Step-to pattern, Decreased stride length (L LE externally rotated) Gait velocity: decreased     General Gait Details: LLE externally rotated and bumping into RW during ambulation. CGA for safety  Stairs            Wheelchair Mobility     Tilt Bed    Modified Rankin (Stroke Patients Only)       Balance Overall balance assessment: Needs assistance Sitting-balance support: No upper extremity supported, Feet supported Sitting balance-Leahy Scale: Good     Standing balance support: Bilateral upper extremity supported, During functional activity Standing balance-Leahy Scale: Fair                               Pertinent Vitals/Pain Pain Assessment Pain Assessment: Faces Faces Pain Scale: Hurts little more Pain Location: B hips Pain Descriptors / Indicators: Grimacing, Aching Pain Intervention(s): Limited activity within patient's tolerance, Monitored during session, Repositioned    Home Living Family/patient expects to be discharged to:: Skilled nursing facility                   Additional Comments: LTC at [facility]    Prior Function Prior Level of Function : Needs assist  Mobility Comments: supervision for ambulation with RW at facility ADLs Comments: facility assists with ADLs,  medication management, and cooking     Extremity/Trunk Assessment   Upper Extremity Assessment Upper Extremity Assessment: Defer to OT evaluation    Lower Extremity Assessment Lower Extremity Assessment: Generalized weakness (L>R)     Cervical / Trunk Assessment Cervical / Trunk Assessment: Kyphotic  Communication   Communication Communication: Impaired Factors Affecting Communication: Difficulty expressing self (intermittent garbled speech)    Cognition Arousal: Alert Behavior During Therapy: WFL for tasks assessed/performed   PT - Cognitive impairments: No apparent impairments                         Following commands: Intact       Cueing       General Comments      Exercises     Assessment/Plan    PT Assessment Patient needs continued PT services  PT Problem List Decreased strength;Decreased activity tolerance;Decreased balance;Decreased mobility;Decreased knowledge of use of DME;Decreased safety awareness;Decreased knowledge of precautions;Decreased coordination;Cardiopulmonary status limiting activity       PT Treatment Interventions Gait training;DME instruction;Functional mobility training;Therapeutic activities;Balance training;Therapeutic exercise;Patient/family education;Neuromuscular re-education    PT Goals (Current goals can be found in the Care Plan section)  Acute Rehab PT Goals Patient Stated Goal: to go back to facility PT Goal Formulation: With patient Time For Goal Achievement: 01/02/24 Potential to Achieve Goals: Fair    Frequency Min 1X/week     Co-evaluation               AM-PAC PT 6 Clicks Mobility  Outcome Measure Help needed turning from your back to your side while in a flat bed without using bedrails?: A Little Help needed moving from lying on your back to sitting on the side of a flat bed without using bedrails?: A Little Help needed moving to and from a bed to a chair (including a wheelchair)?: A Little Help needed standing up from a chair using your arms (e.g., wheelchair or bedside chair)?: A Little Help needed to walk in hospital room?: A Little Help needed climbing 3-5 steps with a railing? : A Lot 6 Click Score: 17    End of Session    Activity Tolerance: Patient tolerated treatment well Patient left: in chair;with call bell/phone within reach;with chair alarm set Nurse Communication: Mobility status PT Visit Diagnosis: Unsteadiness on feet (R26.81);Muscle weakness (generalized) (M62.81);Other abnormalities of gait and mobility (R26.89);History of falling (Z91.81)    Time: 9161-9099 PT Time Calculation (min) (ACUTE ONLY): 22 min   Charges:   PT Evaluation $PT Eval Moderate Complexity: 1 Mod   PT General Charges $$ ACUTE PT VISIT: 1 Visit         Maryanne Finder, PT, DPT Physical Therapist - [facility]  [facility]   Manpreet Strey A Vivan Agostino 12/19/2023, 9:44 AM

## 2023-12-19 NOTE — TOC Transition Note (Signed)
 Transition of Care [facility]) - Progression Note    Patient Details  Name: Allison Hill MRN: [id number] Date of Birth: 29-Mar-1971  Transition of Care [facility]) CM/SW Contact  [place], [place] Phone Number: 12/19/2023, 12:58 PM  Clinical Narrative:     CSW contacted [facility] and spoke with [place]. Patient able to return to facility. Transportation has been arranged. Daughter was contacted and notified.   No further TOC needs observed. TOC signing off.     Barriers to Discharge: No Barriers Identified  Expected Discharge Plan and Services         Expected Discharge Date: 12/19/23                                     Social Determinants of Health (SDOH) Interventions SDOH Screenings   Food Insecurity: No Food Insecurity (12/19/2023)  Housing: Low Risk  (12/19/2023)  Transportation Needs: No Transportation Needs (12/19/2023)  Utilities: Not At Risk (12/19/2023)  Financial Resource Strain: Patient Declined (01/03/2023)   Received from [facility]  Social Connections: Socially Isolated (11/10/2023)  Tobacco Use: High Risk (11/26/2023)    Readmission Risk Interventions     No data to display

## 2023-12-19 NOTE — Care Management Obs Status (Signed)
 MEDICARE OBSERVATION STATUS NOTIFICATION   Patient Details  Name: Allison Hill MRN: [id number] Date of Birth: [birth year]   Medicare Observation Status Notification Given:  No (patient did not want a copy)    [name] 12/19/2023, 12:18 PM

## 2023-12-19 NOTE — Plan of Care (Signed)
 Problem: Education: Goal: Understanding of cardiac disease, CV risk reduction, and recovery process will improve Outcome: Adequate for Discharge Goal: Individualized Educational Video(s) Outcome: Adequate for Discharge   Problem: Activity: Goal: Ability to tolerate increased activity will improve Outcome: Adequate for Discharge   Problem: Cardiac: Goal: Ability to achieve and maintain adequate cardiovascular perfusion will improve Outcome: Adequate for Discharge   Problem: Health Behavior/Discharge Planning: Goal: Ability to safely manage health-related needs after discharge will improve Outcome: Adequate for Discharge   Problem: Education: Goal: Knowledge of disease or condition will improve Outcome: Adequate for Discharge Goal: Knowledge of secondary prevention will improve (MUST DOCUMENT ALL) Outcome: Adequate for Discharge Goal: Knowledge of patient specific risk factors will improve (DELETE if not current risk factor) Outcome: Adequate for Discharge   Problem: Ischemic Stroke/TIA Tissue Perfusion: Goal: Complications of ischemic stroke/TIA will be minimized Outcome: Adequate for Discharge   Problem: Coping: Goal: Will verbalize positive feelings about self Outcome: Adequate for Discharge Goal: Will identify appropriate support needs Outcome: Adequate for Discharge   Problem: Health Behavior/Discharge Planning: Goal: Ability to manage health-related needs will improve Outcome: Adequate for Discharge Goal: Goals will be collaboratively established with patient/family Outcome: Adequate for Discharge   Problem: Self-Care: Goal: Ability to participate in self-care as condition permits will improve Outcome: Adequate for Discharge Goal: Verbalization of feelings and concerns over difficulty with self-care will improve Outcome: Adequate for Discharge Goal: Ability to communicate needs accurately will improve Outcome: Adequate for Discharge   Problem: Nutrition: Goal:  Risk of aspiration will decrease Outcome: Adequate for Discharge Goal: Dietary intake will improve Outcome: Adequate for Discharge   Problem: Education: Goal: Knowledge of General Education information will improve Description: Including pain rating scale, medication(s)/side effects and non-pharmacologic comfort measures Outcome: Adequate for Discharge   Problem: Health Behavior/Discharge Planning: Goal: Ability to manage health-related needs will improve Outcome: Adequate for Discharge   Problem: Clinical Measurements: Goal: Ability to maintain clinical measurements within normal limits will improve Outcome: Adequate for Discharge Goal: Will remain free from infection Outcome: Adequate for Discharge Goal: Diagnostic test results will improve Outcome: Adequate for Discharge Goal: Respiratory complications will improve Outcome: Adequate for Discharge Goal: Cardiovascular complication will be avoided Outcome: Adequate for Discharge   Problem: Activity: Goal: Risk for activity intolerance will decrease Outcome: Adequate for Discharge   Problem: Nutrition: Goal: Adequate nutrition will be maintained Outcome: Adequate for Discharge   Problem: Coping: Goal: Level of anxiety will decrease Outcome: Adequate for Discharge   Problem: Elimination: Goal: Will not experience complications related to bowel motility Outcome: Adequate for Discharge Goal: Will not experience complications related to urinary retention Outcome: Adequate for Discharge   Problem: Pain Managment: Goal: General experience of comfort will improve and/or be controlled Outcome: Adequate for Discharge   Problem: Safety: Goal: Ability to remain free from injury will improve Outcome: Adequate for Discharge   Problem: Skin Integrity: Goal: Risk for impaired skin integrity will decrease Outcome: Adequate for Discharge   Problem: Acute Rehab PT Goals(only PT should resolve) Goal: Pt Will Go Supine/Side To  Sit Outcome: Adequate for Discharge Goal: Patient Will Transfer Sit To/From Stand Outcome: Adequate for Discharge Goal: Pt Will Ambulate Outcome: Adequate for Discharge Goal: Pt/caregiver will Perform Home Exercise Program Outcome: Adequate for Discharge   Problem: Acute Rehab OT Goals (only OT should resolve) Goal: Pt. Will Perform Grooming Outcome: Adequate for Discharge Goal: Pt. Will Perform Lower Body Dressing Outcome: Adequate for Discharge Goal: Pt. Will Transfer To Toilet Outcome: Adequate for Discharge  Goal: Pt. Will Perform Toileting-Clothing Manipulation Outcome: Adequate for Discharge

## 2023-12-21 ENCOUNTER — Ambulatory Visit: Payer: Self-pay

## 2023-12-21 NOTE — Telephone Encounter (Signed)
 FYI Only or Action Required?: FYI only for provider.  Patient was last seen in primary care on N/A, New Patient.  Called Nurse Triage reporting No chief complaint on file..  Symptoms began about a month ago.  Interventions attempted: Rest, hydration, or home remedies and Ice/heat application.  Symptoms are: gradually worsening.  Triage Disposition: See Physician Within 24 Hours  Patient/caregiver understands and will follow disposition?: Yes   Copied from CRM [id number]. Topic: Clinical - Red Word Triage >> Dec 21, 2023  8:02 AM Larissa RAMAN wrote: [facility] that prompted transfer to Nurse Triage: RT thumb has wart that continues to bleed   ----------------------------------------------------------------------- From previous Reason for Contact - Scheduling: Patient/patient representative is calling to schedule an appointment. Refer to attachments for appointment information. Reason for Disposition . Large swelling or bruise  Answer Assessment - Initial Assessment Questions 1. MECHANISM: How did the injury happen?      A blister was on the thumb, and it ruptured causing the bleeding  2. ONSET: When did the injury happen? (e.g., minutes, hours ago)      About a month or so  3. LOCATION: What part of the finger is injured? Is the nail damaged?      Middle of the thumb, on a vein or something  4. APPEARANCE of the INJURY: What does the injury look like?      Purple hue, around the site  5. SEVERITY: Can you use the hand normally?  Can you bend your fingers into a ball and then fully open them?     Yes  6. SIZE: For cuts, bruises, or swelling, ask: How large is it? (e.g., inches or centimeters;  entire finger)      Around the site of injury  7. PAIN: Is there pain? If Yes, ask: How bad is the pain?  (Scale 0-10; or none, mild, moderate, severe)     No  8. TETANUS: For any breaks in the skin, ask: When was your last tetanus booster?     Unsure  9. OTHER  SYMPTOMS: Do you have any other symptoms?     No  10. PREGNANCY: Is there any chance you are pregnant? When was your last menstrual period?       No and No   Patient cancelled the appointment previously scheduled as they cannot see a Primary care provider in the facility they are currently in.  Protocols used: Finger Injury-A-AH

## 2023-12-24 ENCOUNTER — Ambulatory Visit: Admitting: Family Medicine

## 2024-01-21 ENCOUNTER — Other Ambulatory Visit: Payer: Self-pay

## 2024-01-21 ENCOUNTER — Emergency Department

## 2024-01-21 ENCOUNTER — Observation Stay
Admission: EM | Admit: 2024-01-21 | Discharge: 2024-01-22 | Disposition: A | Discharge status: Home / Self Care | Attending: Internal Medicine | Admitting: Internal Medicine

## 2024-01-21 ENCOUNTER — Encounter: Payer: Self-pay | Admitting: *Deleted

## 2024-01-21 DIAGNOSIS — N186 End stage renal disease: Secondary | ICD-10-CM | POA: Insufficient documentation

## 2024-01-21 DIAGNOSIS — I69354 Hemiplegia and hemiparesis following cerebral infarction affecting left non-dominant side: Secondary | ICD-10-CM | POA: Insufficient documentation

## 2024-01-21 DIAGNOSIS — Z6830 Body mass index (BMI) 30.0-30.9, adult: Secondary | ICD-10-CM | POA: Insufficient documentation

## 2024-01-21 DIAGNOSIS — Z87891 Personal history of nicotine dependence: Secondary | ICD-10-CM | POA: Diagnosis not present

## 2024-01-21 DIAGNOSIS — Z8673 Personal history of transient ischemic attack (TIA), and cerebral infarction without residual deficits: Secondary | ICD-10-CM | POA: Insufficient documentation

## 2024-01-21 DIAGNOSIS — I10 Essential (primary) hypertension: Secondary | ICD-10-CM | POA: Diagnosis present

## 2024-01-21 DIAGNOSIS — R200 Anesthesia of skin: Secondary | ICD-10-CM | POA: Diagnosis present

## 2024-01-21 DIAGNOSIS — E66811 Obesity, class 1: Secondary | ICD-10-CM | POA: Diagnosis not present

## 2024-01-21 DIAGNOSIS — E669 Obesity, unspecified: Secondary | ICD-10-CM | POA: Diagnosis present

## 2024-01-21 DIAGNOSIS — I5032 Chronic diastolic (congestive) heart failure: Secondary | ICD-10-CM | POA: Insufficient documentation

## 2024-01-21 DIAGNOSIS — F1490 Cocaine use, unspecified, uncomplicated: Secondary | ICD-10-CM | POA: Diagnosis not present

## 2024-01-21 DIAGNOSIS — Z79899 Other long term (current) drug therapy: Secondary | ICD-10-CM | POA: Insufficient documentation

## 2024-01-21 DIAGNOSIS — R202 Paresthesia of skin: Principal | ICD-10-CM | POA: Insufficient documentation

## 2024-01-21 DIAGNOSIS — Z992 Dependence on renal dialysis: Secondary | ICD-10-CM | POA: Diagnosis not present

## 2024-01-21 DIAGNOSIS — D573 Sickle-cell trait: Secondary | ICD-10-CM | POA: Diagnosis not present

## 2024-01-21 DIAGNOSIS — Z7901 Long term (current) use of anticoagulants: Secondary | ICD-10-CM | POA: Insufficient documentation

## 2024-01-21 DIAGNOSIS — Z7982 Long term (current) use of aspirin: Secondary | ICD-10-CM | POA: Diagnosis not present

## 2024-01-21 DIAGNOSIS — I132 Hypertensive heart and chronic kidney disease with heart failure and with stage 5 chronic kidney disease, or end stage renal disease: Secondary | ICD-10-CM | POA: Diagnosis not present

## 2024-01-21 LAB — COMPREHENSIVE METABOLIC PANEL WITH GFR
ALT: 8 U/L (ref 0–44)
AST: 17 U/L (ref 15–41)
Albumin: 3.7 g/dL (ref 3.5–5.0)
Alkaline Phosphatase: 333 U/L — ABNORMAL HIGH (ref 38–126)
Anion gap: 17 — ABNORMAL HIGH (ref 5–15)
BUN: 79 mg/dL — ABNORMAL HIGH (ref 6–20)
CO2: 23 mmol/L (ref 22–32)
Calcium: 7.9 mg/dL — ABNORMAL LOW (ref 8.9–10.3)
Chloride: 96 mmol/L — ABNORMAL LOW (ref 98–111)
Creatinine, Ser: 7.23 mg/dL — ABNORMAL HIGH (ref 0.44–1.00)
GFR, Estimated: 6 mL/min — ABNORMAL LOW (ref >60)
Glucose, Bld: 98 mg/dL (ref 70–99)
Potassium: 4.6 mmol/L (ref 3.5–5.1)
Sodium: 136 mmol/L (ref 135–145)
Total Bilirubin: 0.4 mg/dL (ref 0.0–1.2)
Total Protein: 7.5 g/dL (ref 6.5–8.1)

## 2024-01-21 LAB — CBC WITH DIFFERENTIAL/PLATELET
Abs Immature Granulocytes: 0.01 K/uL (ref 0.00–0.07)
Basophils Absolute: 0 K/uL (ref 0.0–0.1)
Basophils Relative: 1 %
Eosinophils Absolute: 0.1 K/uL (ref 0.0–0.5)
Eosinophils Relative: 2 %
HCT: 38.2 % (ref 36.0–46.0)
Hemoglobin: 12.7 g/dL (ref 12.0–15.0)
Immature Granulocytes: 0 %
Lymphocytes Relative: 27 %
Lymphs Abs: 1.3 K/uL (ref 0.7–4.0)
MCH: 27.5 pg (ref 26.0–34.0)
MCHC: 33.2 g/dL (ref 30.0–36.0)
MCV: 82.9 fL (ref 80.0–100.0)
Monocytes Absolute: 0.6 K/uL (ref 0.1–1.0)
Monocytes Relative: 12 %
Neutro Abs: 2.8 K/uL (ref 1.7–7.7)
Neutrophils Relative %: 58 %
Platelets: 163 K/uL (ref 150–400)
RBC: 4.61 MIL/uL (ref 3.87–5.11)
RDW: 16.5 % — ABNORMAL HIGH (ref 11.5–15.5)
WBC: 4.8 K/uL (ref 4.0–10.5)
nRBC: 0 % (ref 0.0–0.2)

## 2024-01-21 LAB — CBG MONITORING, ED: Glucose-Capillary: 99 mg/dL (ref 70–99)

## 2024-01-21 LAB — PROTIME-INR
INR: 1 (ref 0.8–1.2)
Prothrombin Time: 13.3 s (ref 11.4–15.2)

## 2024-01-21 LAB — APTT: aPTT: 28 s (ref 24–36)

## 2024-01-21 LAB — ETHANOL: Alcohol, Ethyl (B): <15 mg/dL (ref <15)

## 2024-01-21 MED ORDER — SODIUM CHLORIDE 0.9% FLUSH
3.0000 mL | Freq: Once | INTRAVENOUS | Status: AC
  Administered 2024-01-21: 3 mL via INTRAVENOUS

## 2024-01-21 MED ORDER — IOHEXOL 350 MG/ML SOLN
75.0000 mL | Freq: Once | INTRAVENOUS | Status: AC | PRN
  Administered 2024-01-21: 75 mL via INTRAVENOUS

## 2024-01-21 NOTE — ED Triage Notes (Signed)
 Pt brought in via ems from [facility].  Pt reports numbness in left leg and left arm.  Hx cva.   Sx began at 1930.

## 2024-01-21 NOTE — ED Notes (Signed)
 Fsbs 99 in triage

## 2024-01-21 NOTE — ED Notes (Signed)
 Pt to  ct scan with rn from triage.

## 2024-01-21 NOTE — ED Provider Notes (Signed)
 [facility] Provider Note   Event Date/Time   First MD Initiated Contact with Patient 01/21/24 2142     (approximate) History  Extremity Weakness  HPI Allison Hill is a 53 y.o. 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  Patient states that the symptoms are both new from her baseline.  Patient states that she was off with a caregiver today from her group home all day.  The staff at patient's facility were concerned that patient may have been using illicit substances as she does have a history of polysubstance abuse as well as has been away from the facility all day. ROS: Patient currently denies any vision changes, tinnitus, difficulty speaking, facial droop, sore throat, chest pain, shortness of breath, abdominal pain, nausea/vomiting/diarrhea, dysuria   Physical Exam  Triage Vital Signs: ED Triage Vitals  Encounter Vitals Group     BP 01/21/24 2136 122/86     Girls Systolic BP Percentile --      Girls Diastolic BP Percentile --      Boys Systolic BP Percentile --      Boys Diastolic BP Percentile --      Pulse Rate 01/21/24 2136 73     Resp 01/21/24 2136 20     Temp 01/21/24 2136 97.7 F (36.5 C)     Temp Source 01/21/24 2136 Oral     SpO2 01/21/24 2136 97 %     Weight 01/21/24 2133 169 lb 12.1 oz (77 kg)     Height 01/21/24 2133 5' 3 (1.6 m)     Head Circumference --      Peak Flow --      Pain Score 01/21/24 2133 0     Pain Loc --      Pain Education --      Exclude from Growth Chart --    Most recent vital signs: Vitals:   01/21/24 2136  BP: 122/86  Pulse: 73  Resp: 20  Temp: 97.7 F (36.5 C)  SpO2: 97%   General: Awake, oriented x4. CV:  Good peripheral perfusion. Resp:  Normal  effort. Abd:  No distention. Other:  Middle-aged obese African-American female resting comfortably in no acute distress NIHSS 2 ED Results / Procedures / Treatments  Labs (all labs ordered are listed, but only abnormal results are displayed) Labs Reviewed  CBC WITH DIFFERENTIAL/PLATELET - Abnormal; Notable for the following components:      Result Value   RDW 16.5 (*)    All other components within normal limits  PROTIME-INR  APTT  ETHANOL  URINE DRUG SCREEN, QUALITATIVE ([facility] ONLY)  COMPREHENSIVE METABOLIC PANEL WITH GFR  CBG MONITORING, ED  CBG MONITORING, ED   EKG ED ECG REPORT I, Artist MARLA Kerns, the attending physician, personally viewed and interpreted this ECG. Date: 01/21/2024 EKG Time: 2224 Rate: 71 Rhythm: normal sinus rhythm QRS Axis: normal Intervals: normal ST/T Wave abnormalities: normal Narrative Interpretation: no evidence of acute ischemia RADIOLOGY ED MD interpretation: Noncontrasted head CT shows a hyperdense appearance of the right M2 MCA vessel that is possibly thrombosed and recommends a CTA of the head and neck stat  - All radiology independently interpreted and agree with radiology assessment Official radiology report(s): CT ANGIO HEAD NECK W WO CM (CODE STROKE) Result Date: 01/21/2024  CLINICAL DATA:  Left sided weakness/numbness EXAM: CT ANGIOGRAPHY HEAD AND NECK WITH AND WITHOUT CONTRAST TECHNIQUE: Multidetector CT imaging of the head and neck was performed using the standard protocol during bolus administration of intravenous contrast. Multiplanar CT image reconstructions and MIPs were obtained to evaluate the vascular anatomy. Carotid stenosis measurements (when applicable) are obtained utilizing NASCET criteria, using the distal internal carotid diameter as the denominator. RADIATION DOSE REDUCTION: This exam was performed according to the departmental dose-optimization program which includes automated exposure control, adjustment of the mA and/or kV  according to patient size and/or use of iterative reconstruction technique. CONTRAST:  75mL OMNIPAQUE  IOHEXOL  350 MG/ML SOLN COMPARISON:  Same day CT head.  CTA head and neck 12/18/2023 FINDINGS: CTA NECK FINDINGS Aortic arch: Great vessel origins are patent without significant stenosis. Right carotid system: No evidence of dissection, stenosis (50% or greater), or occlusion. Left carotid system: No evidence of dissection, stenosis (50% or greater), or occlusion. Vertebral arteries: Left dominant. No evidence of dissection, stenosis (50% or greater), or occlusion. Skeleton: No acute abnormality. Other neck: No acute abnormality. Upper chest: No acute abnormality. Review of the MIP images confirms the above findings CTA HEAD FINDINGS Anterior circulation: Bilateral intracranial ICAs, MCAs, and ACAs are patent without proximal hemodynamically significant stenosis. Posterior circulation: Bilateral intradural vertebral arteries, basilar artery and bilateral posterior cerebral arteries are patent without proximal hemodynamically significant stenosis. Venous sinuses: As permitted by contrast timing, patent. Review of the MIP images confirms the above findings IMPRESSION: No emergent large vessel occlusion or proximal hemodynamically significant stenosis. Electronically Signed   By: Gilmore GORMAN Molt M.D.   On: 01/21/2024 22:30   CT HEAD CODE STROKE WO CONTRAST Result Date: 01/21/2024 CLINICAL DATA:  Code stroke.  Neuro deficit, acute, stroke suspected EXAM: CT HEAD WITHOUT CONTRAST TECHNIQUE: Contiguous axial images were obtained from the base of the skull through the vertex without intravenous contrast. RADIATION DOSE REDUCTION: This exam was performed according to the departmental dose-optimization program which includes automated exposure control, adjustment of the mA and/or kV according to patient size and/or use of iterative reconstruction technique. COMPARISON:  CT head 12/18/2023. FINDINGS: Brain: No evidence of  acute large vascular territory infarction, hemorrhage, hydrocephalus, extra-axial collection or mass lesion/mass effect. Similar patchy white matter hypodensities. Vascular: Hyperdense appearance of the right M2 MCA (for example see series 6, image 12). Skull: No acute fracture. Sinuses/Orbits: Clear sinuses. ASPECTS [facility] Stroke Program Early CT Score) Total score (0-10 with 10 being normal): 10. IMPRESSION: 1. Hyperdense appearance of a right M2 MCA vessel, possibly thrombosed. Recommend stat CTA of the head/neck to further evaluate. 2. No convincing evidence of acute large vascular territory infarct. 3. No acute hemorrhage. Findings cos with Dr. Jossie via telephone at 9:56 PM. Electronically Signed   By: Gilmore GORMAN Molt M.D.   On: 01/21/2024 22:00   PROCEDURES: Critical Care performed: No Procedures MEDICATIONS ORDERED IN ED: Medications  sodium chloride  flush (NS) 0.9 % injection 3 mL (3 mLs Intravenous Given 01/21/24 2233)  iohexol  (OMNIPAQUE ) 350 MG/ML injection 75 mL (75 mLs Intravenous Contrast Given 01/21/24 2201)   IMPRESSION / MDM / ASSESSMENT AND PLAN / ED COURSE  I reviewed the triage vital signs and the nursing notes.                             The patient is on the cardiac monitor to evaluate for evidence of arrhythmia and/or significant heart rate changes. Patient's presentation  is most consistent with acute presentation with potential threat to life or bodily function. Patient's 53 year old female who presents as a code stroke with concerning symptoms of weakness, numbness, and tingling in the left upper and lower extremity. Code stroke initiated from triage DDx: CVA, recrudescence, complex migraine Plan: CBC, CMP, INR, UDS, ethanol, EKG, CT head, CT angiography of the head and neck Results: I was called by our radiologist who was concerned for possible right M2 MCA occlusion. I then spoke with the on-call neurologist who performed an over read of patient's CT angiography  of the head and neck that did not show any evidence of acute thrombosis and recommended admission for MRI and restarting patient on aspirin   Dispo: Admit to medicine   FINAL CLINICAL IMPRESSION(S) / ED DIAGNOSES   Final diagnoses:  Paresthesia of left upper and lower extremity   Rx / DC Orders   ED Discharge Orders     None      Note:  This document was prepared using Dragon voice recognition software and may include unintentional dictation errors.   Jossie Artist POUR, MD 01/21/24 [phone]

## 2024-01-21 NOTE — ED Notes (Signed)
 ED secretary notified to activate Code Stroke; charge nurse CHRISTELLA Dawn RN notified; pt enroute to CT accomp by triage nurse DELENA Monarch RN

## 2024-01-21 NOTE — ED Notes (Signed)
 Code Stroke called to [facility] spoke with Kinana

## 2024-01-21 NOTE — Consult Note (Signed)
 [facility] Consult Services   Patient Name:   Allison Hill Date of Birth:   1971-02-22 Identification Number:   MRN - [id number] Date of Service:   01/21/2024 21:43:42  Diagnosis:       I63.89 - Cerebrovascular accident (CVA) due to other mechanism [facility])  Impression:      53 yo female with history of prior strokes with residual Left hemiparesis, HTN, ESRD on dialysis, former smoker presenting to the ED with Left sided numbness starting at ~1930. NIHSS 5. CT Head and CTA negative for acute findings. She is not a candidate for thrombolytics due to innumerable chronic microhemorrhages on MRI seen last month. Concern for likely stroke recrudescence, cannot completely r/o new acute stroke. Plan to admit for MRI Brain wo and infectious/metabolic workup. Continue ASA 81 mg for now.  Our recommendations are outlined below.  Recommendations:        Stroke/Telemetry Floor       Neuro Checks (Q4)       Bedside Swallow Eval       DVT Prophylaxis       IV Fluids, Normal Saline       Head of Bed 30 Degrees       Euglycemia and Avoid Hyperthermia (PRN Acetaminophen )       Initiate or continue Aspirin  81 MG daily  Sign Out:       Discussed with Emergency Department Provider    ------------------------------------------------------------------------------  Advanced Imaging: CTA Head and Neck Completed.  LVO:No  Patient is not a candidate for NIR   Metrics: Last Known Well: 01/21/2024 18:00:00 Dispatch Time: 01/21/2024 21:43:42 Arrival Time: 01/21/2024 21:17:00 Initial Response Time: 01/21/2024 21:46:52 Symptoms: Left sided numbness/weakness. Initial patient interaction: 01/21/2024 21:48:58 NIHSS Assessment Completed: 01/21/2024 21:57:21 Patient is not a candidate for Thrombolytic. Thrombolytic Medical Decision: 01/21/2024 21:57:22 Patient was not deemed candidate for Thrombolytic because of following reasons: >10 cerebral microbleeds on  previous MRI brain .  CT Head: CT head unremarkable for acute infarction or hemorrhage per Radiology: Hyperdense appearance of a right M2 MCA vessel, possibly thrombosed. No convincing evidence of acute large vascular territory infarct. No acute hemorrhage.   Primary Provider Notified of Diagnostic Impression and Management Plan on: 01/21/2024 22:26:40    ------------------------------------------------------------------------------  History of Present Illness: Patient is a 53 year old Female.  Patient was brought by EMS for symptoms of Left sided numbness/weakness. Patient presenting from home. Reports that she was last completely normal at ~1900 after eating dinner at [facility] when she started to develop Left sided numbness. She has a history of prior stroke with residual Left sided weakness. States that this numbness is new.    Past Medical History:      Hypertension      Stroke      Migraine Headaches Other PMH:  ESRD on dialysis  Medications:  No Anticoagulant use  Antiplatelet use: Yes ASA 81 mg Reviewed EMR for current medications  Allergies:  Reviewed  Social History: Smoking: Former  Family History:  There is no family history of premature cerebrovascular disease pertinent to this consultation  ROS : 14 Points Review of Systems was performed and was negative except mentioned in HPI.  Past Surgical History: There Is No Surgical History Contributory To Today's Visit     Examination: BP(122/86), Pulse(73), Blood Glucose(99) 1A: Level of Consciousness - Alert; keenly responsive + 0 1B: Ask Month and Age - Both Questions Right + 0 1C: Blink Eyes & Squeeze Hands - Performs Both Tasks +  0 2: Test Horizontal Extraocular Movements - Normal + 0 3: Test Visual Fields - No Visual Loss + 0 4: Test Facial Palsy (Use Grimace if Obtunded) - Minor paralysis (flat nasolabial fold, smile asymmetry) + 1 5A: Test Left Arm Motor Drift - Drift, but doesn't hit bed +  1 5B: Test Right Arm Motor Drift - No Drift for 10 Seconds + 0 6A: Test Left Leg Motor Drift - Some Effort Against Gravity + 2 6B: Test Right Leg Motor Drift - No Drift for 5 Seconds + 0 7: Test Limb Ataxia (FNF/Heel-Shin) - No Ataxia + 0 8: Test Sensation - Mild-Moderate Loss: Can Sense Being Touched + 1 9: Test Language/Aphasia - Normal; No aphasia + 0 10: Test Dysarthria - Normal + 0 11: Test Extinction/Inattention - No abnormality + 0  NIHSS Score: 5   Pre-Morbid Modified Rankin Scale: 3 Points = Moderate disability; requiring some help, but able to walk without assistance  Spoke with : Dr. Jossie I reviewed the available imaging via [facility] and initiated discussion with the primary provider  This consult was conducted in real time using interactive audio and video technology. Patient was informed of the technology being used for this visit and agreed to proceed. Patient located in hospital and provider located at home/office setting.   Patient is being evaluated for possible acute neurologic impairment and high probability of imminent or life-threatening deterioration. I spent total of 30 minutes providing care to this patient, including time for face to face visit via telemedicine, review of medical records, imaging studies and discussion of findings with providers, the patient and/or family.    Dr Joesph Essex   [facility] For Inpatient follow-up with [facility] physician please call [facility] at [phone]. As we are not an outpatient service for any post hospital discharge needs please contact the hospital for assistance. If you have any questions for the [facility] physicians or need to reconsult for clinical or diagnostic changes please contact us  via [facility] at [phone].   Signature : Joesph Essex

## 2024-01-21 NOTE — ED Provider Notes (Signed)
-----------------------------------------   11:25 PM on 01/21/2024 -----------------------------------------  Assuming care from Dr. Jossie.  In short, Allison Hill is a 53 y.o. female with a chief complaint of probable CVA.  Refer to the original H&P for additional details.  The current plan of care is to admit for full stroke workup after labs are available.   Clinical Course as of 01/22/24 0148  Thu Jan 21, 2024  2334 BP within normal limits [CF]  2353 Patient is on dialysis and given that the CMP is essentially normal for her.  CBC looks normal, ethanol level is negative.  Unclear if the patient makes urine to check a urine drug screen, we will check with the patient. [CF]  2353 I am consulting the hospitalist team for admission.   [CF]  Fri Jan 22, 2024  0147 (delayed documentation)  I consulted by phone with the admitting hospitalist, and they will admit the patient - Dr. Hilma. [CF]    Clinical Course User Index [CF] Gordan Huxley, MD     Medications  hydrALAZINE  (APRESOLINE ) injection 5 mg (has no administration in time range)  diphenhydrAMINE  (BENADRYL ) injection 12.5 mg (has no administration in time range)   stroke: early stages of recovery book (has no administration in time range)  0.9 %  sodium chloride  infusion ( Intravenous New Bag/Given 01/22/24 0124)  acetaminophen  (TYLENOL ) tablet 650 mg (has no administration in time range)    Or  acetaminophen  (TYLENOL ) 160 MG/5ML solution 650 mg (has no administration in time range)    Or  acetaminophen  (TYLENOL ) suppository 650 mg (has no administration in time range)  senna-docusate (Senokot-S) tablet 1 tablet (has no administration in time range)  heparin  injection 5,000 Units (has no administration in time range)  aspirin  suppository 300 mg (has no administration in time range)    Or  aspirin  tablet 325 mg (has no administration in time range)  calcitRIOL  (ROCALTROL ) capsule 0.25 mcg (has no administration in time range)   cinacalcet  (SENSIPAR ) tablet 60 mg (has no administration in time range)  meclizine  (ANTIVERT ) tablet 25 mg (has no administration in time range)  pantoprazole  (PROTONIX ) EC tablet 40 mg (has no administration in time range)  sevelamer  carbonate (RENVELA ) tablet 1,600 mg (has no administration in time range)  melatonin tablet 5 mg (5 mg Oral Given 01/22/24 0126)  gabapentin  (NEURONTIN ) capsule 200 mg (has no administration in time range)  atorvastatin  (LIPITOR) tablet 40 mg (has no administration in time range)  sodium chloride  flush (NS) 0.9 % injection 3 mL (3 mLs Intravenous Given 01/21/24 2233)  iohexol  (OMNIPAQUE ) 350 MG/ML injection 75 mL (75 mLs Intravenous Contrast Given 01/21/24 2201)     ED Discharge Orders     None      Final diagnoses:  Paresthesia of left upper and lower extremity     Gordan Huxley, MD 01/22/24 9851

## 2024-01-21 NOTE — ED Notes (Signed)
 Pt back to room from CT - applying monitoring

## 2024-01-22 ENCOUNTER — Observation Stay

## 2024-01-22 ENCOUNTER — Encounter: Payer: Self-pay | Admitting: Internal Medicine

## 2024-01-22 DIAGNOSIS — I10 Essential (primary) hypertension: Secondary | ICD-10-CM | POA: Diagnosis not present

## 2024-01-22 DIAGNOSIS — R200 Anesthesia of skin: Secondary | ICD-10-CM

## 2024-01-22 DIAGNOSIS — N186 End stage renal disease: Secondary | ICD-10-CM

## 2024-01-22 DIAGNOSIS — Z992 Dependence on renal dialysis: Secondary | ICD-10-CM

## 2024-01-22 DIAGNOSIS — I5032 Chronic diastolic (congestive) heart failure: Secondary | ICD-10-CM

## 2024-01-22 DIAGNOSIS — Z8673 Personal history of transient ischemic attack (TIA), and cerebral infarction without residual deficits: Secondary | ICD-10-CM | POA: Diagnosis not present

## 2024-01-22 DIAGNOSIS — R202 Paresthesia of skin: Secondary | ICD-10-CM | POA: Diagnosis not present

## 2024-01-22 DIAGNOSIS — F1490 Cocaine use, unspecified, uncomplicated: Secondary | ICD-10-CM

## 2024-01-22 DIAGNOSIS — E669 Obesity, unspecified: Secondary | ICD-10-CM | POA: Diagnosis present

## 2024-01-22 LAB — RENAL FUNCTION PANEL
Albumin: 3.4 g/dL — ABNORMAL LOW (ref 3.5–5.0)
Anion gap: 13 (ref 5–15)
BUN: 82 mg/dL — ABNORMAL HIGH (ref 6–20)
CO2: 25 mmol/L (ref 22–32)
Calcium: 7.7 mg/dL — ABNORMAL LOW (ref 8.9–10.3)
Chloride: 96 mmol/L — ABNORMAL LOW (ref 98–111)
Creatinine, Ser: 8.07 mg/dL — ABNORMAL HIGH (ref 0.44–1.00)
GFR, Estimated: 6 mL/min — ABNORMAL LOW (ref >60)
Glucose, Bld: 112 mg/dL — ABNORMAL HIGH (ref 70–99)
Phosphorus: 4.8 mg/dL — ABNORMAL HIGH (ref 2.5–4.6)
Potassium: 4.9 mmol/L (ref 3.5–5.1)
Sodium: 134 mmol/L — ABNORMAL LOW (ref 135–145)

## 2024-01-22 LAB — URINE DRUG SCREEN, QUALITATIVE (ARMC ONLY)
Amphetamines, Ur Screen: NOT DETECTED
Barbiturates, Ur Screen: NOT DETECTED
Benzodiazepine, Ur Scrn: NOT DETECTED
Cannabinoid 50 Ng, Ur ~~LOC~~: NOT DETECTED
Cocaine Metabolite,Ur ~~LOC~~: NOT DETECTED
MDMA (Ecstasy)Ur Screen: NOT DETECTED
Methadone Scn, Ur: NOT DETECTED
Opiate, Ur Screen: NOT DETECTED
Phencyclidine (PCP) Ur S: NOT DETECTED
Tricyclic, Ur Screen: NOT DETECTED

## 2024-01-22 LAB — CBC
HCT: 37 % (ref 36.0–46.0)
Hemoglobin: 12 g/dL (ref 12.0–15.0)
MCH: 26.7 pg (ref 26.0–34.0)
MCHC: 32.4 g/dL (ref 30.0–36.0)
MCV: 82.4 fL (ref 80.0–100.0)
Platelets: 183 K/uL (ref 150–400)
RBC: 4.49 MIL/uL (ref 3.87–5.11)
RDW: 16.5 % — ABNORMAL HIGH (ref 11.5–15.5)
WBC: 3.5 K/uL — ABNORMAL LOW (ref 4.0–10.5)
nRBC: 0 % (ref 0.0–0.2)

## 2024-01-22 LAB — HEPATITIS B SURFACE ANTIGEN: Hepatitis B Surface Ag: NONREACTIVE

## 2024-01-22 MED ORDER — STROKE: EARLY STAGES OF RECOVERY BOOK: Freq: Once | Status: DC

## 2024-01-22 MED ORDER — CALCITRIOL 0.25 MCG PO CAPS
0.2500 ug | ORAL_CAPSULE | ORAL | Status: DC
  Filled 2024-01-22: qty 1

## 2024-01-22 MED ORDER — MECLIZINE HCL 25 MG PO TABS: 25.0000 mg | ORAL_TABLET | Freq: Two times a day (BID) | ORAL | Status: DC | PRN

## 2024-01-22 MED ORDER — CINACALCET HCL 30 MG PO TABS
60.0000 mg | ORAL_TABLET | Freq: Every day | ORAL | Status: DC
  Filled 2024-01-22: qty 2

## 2024-01-22 MED ORDER — GABAPENTIN 100 MG PO CAPS
200.0000 mg | ORAL_CAPSULE | Freq: Three times a day (TID) | ORAL | Status: DC
Start: 2024-01-22 — End: 2024-01-23
  Administered 2024-01-22: 200 mg via ORAL
  Filled 2024-01-22: qty 2

## 2024-01-22 MED ORDER — ASPIRIN 300 MG RE SUPP: 300.0000 mg | Freq: Every day | RECTAL | Status: DC

## 2024-01-22 MED ORDER — MELATONIN 5 MG PO TABS
5.0000 mg | ORAL_TABLET | Freq: Every day | ORAL | Status: DC
  Administered 2024-01-22: 5 mg via ORAL
  Filled 2024-01-22: qty 1

## 2024-01-22 MED ORDER — ACETAMINOPHEN 325 MG PO TABS: 650.0000 mg | ORAL_TABLET | ORAL | Status: DC | PRN

## 2024-01-22 MED ORDER — HEPARIN SODIUM (PORCINE) 1000 UNIT/ML IJ SOLN
INTRAMUSCULAR | Status: AC
  Filled 2024-01-22: qty 4

## 2024-01-22 MED ORDER — ACETAMINOPHEN 160 MG/5ML PO SOLN
650.0000 mg | ORAL | Status: DC | PRN
Start: 2024-01-22 — End: 2024-01-23

## 2024-01-22 MED ORDER — ATORVASTATIN CALCIUM 20 MG PO TABS: 40.0000 mg | ORAL_TABLET | Freq: Every day | ORAL | Status: DC

## 2024-01-22 MED ORDER — DIPHENHYDRAMINE HCL 50 MG/ML IJ SOLN: 12.5000 mg | Freq: Three times a day (TID) | INTRAMUSCULAR | Status: DC | PRN

## 2024-01-22 MED ORDER — SENNOSIDES-DOCUSATE SODIUM 8.6-50 MG PO TABS
1.0000 | ORAL_TABLET | Freq: Every evening | ORAL | Status: DC | PRN
Start: 2024-01-22 — End: 2024-01-23

## 2024-01-22 MED ORDER — ASPIRIN 325 MG PO TABS
325.0000 mg | ORAL_TABLET | Freq: Every day | ORAL | Status: DC
  Administered 2024-01-22: 325 mg via ORAL
  Filled 2024-01-22: qty 1

## 2024-01-22 MED ORDER — SEVELAMER CARBONATE 800 MG PO TABS
1600.0000 mg | ORAL_TABLET | Freq: Three times a day (TID) | ORAL | Status: DC
  Administered 2024-01-22: 1600 mg via ORAL
  Filled 2024-01-22 (×4): qty 2

## 2024-01-22 MED ORDER — HEPARIN SODIUM (PORCINE) 5000 UNIT/ML IJ SOLN
5000.0000 [IU] | Freq: Three times a day (TID) | INTRAMUSCULAR | Status: DC
  Administered 2024-01-22: 5000 [IU] via SUBCUTANEOUS
  Filled 2024-01-22: qty 1

## 2024-01-22 MED ORDER — PANTOPRAZOLE SODIUM 40 MG PO TBEC
40.0000 mg | DELAYED_RELEASE_TABLET | Freq: Every day | ORAL | Status: DC
  Filled 2024-01-22: qty 1

## 2024-01-22 MED ORDER — ONDANSETRON HCL 4 MG/2ML IJ SOLN: 4.0000 mg | Freq: Three times a day (TID) | INTRAMUSCULAR | Status: DC | PRN

## 2024-01-22 MED ORDER — HYDRALAZINE HCL 20 MG/ML IJ SOLN: 5.0000 mg | INTRAMUSCULAR | Status: DC | PRN

## 2024-01-22 MED ORDER — CHLORHEXIDINE GLUCONATE CLOTH 2 % EX PADS
6.0000 | MEDICATED_PAD | Freq: Every day | CUTANEOUS | Status: DC
  Filled 2024-01-22 (×2): qty 6

## 2024-01-22 MED ORDER — ACETAMINOPHEN 325 MG RE SUPP: 650.0000 mg | RECTAL | Status: DC | PRN

## 2024-01-22 MED ORDER — SODIUM CHLORIDE 0.9 % IV SOLN: INTRAVENOUS | Status: DC

## 2024-01-22 NOTE — ED Notes (Addendum)
 Staff reports patient went out  with husband they didn't know she had for a very long time from the facility and recommend a tox screen. Reports she frequently calls 911 to b e sent out herself. Hx of substance abuse per [employer].

## 2024-01-22 NOTE — Progress Notes (Signed)
 Pre-HD HepResults

## 2024-01-22 NOTE — Discharge Summary (Signed)
 Physician Discharge Summary   Patient: Allison Hill MRN: [id number] DOB: 12-04-1970  Admit date:     01/21/2024  Discharge date: 01/22/24  Discharge Physician: Murvin Mana   PCP: [facility], [facility]   Recommendations at discharge:   Follow-up with PCP in 1 week.  Discharge Diagnoses: Principal Problem:   Left sided numbness Active Problems:   History of stroke   Chronic heart failure with preserved ejection fraction (HFpEF) (HCC)   HTN (hypertension)   ESRD on dialysis (HCC)   Cocaine use   Obesity (BMI 30-39.9) Class I obesity with BMI 30.07. Resolved Problems:   * No resolved hospital problems. [facility] Course:  Allison Hill is a 53 y.o. female with medical history significant of ESRD-HD ([facility]), stroke with left-sided weakness, HTN, dCHF, chronic, obesity, migraine, cocaine abuse, alcohol abuse in remission, former smoker, who presents with left-sided numbness.   MRI of the brian showed: 1. No evidence of acute intracranial abnormality. 2. 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 3. Many chronic microhemorrhages in the basal ganglia, thalami, brainstem and cerebellum, likely the sequela of chronic hypertension.  Patient is seen by neurology, does not recommend any new treatment or change in treatment.  Patient will be transferred back to nursing home  Assessment and Plan: Left sided numbness and hx of stroke Prior hemorrhagic stroke with left-sided weakness. : Patient has chronic left-sided weakness, now has new left-sided numbness, concerning for new stroke. CT-head negative for intracranial hemorrhage.  CTA negative for LVO.   MRI of the brain showed prior hemorrhagic stroke. No need for initiate any new treatment.  Patient medically stable for discharge back to nursing home.   Chronic heart failure with preserved ejection fraction (HFpEF) (HCC): 2D echo on 12/08/2023 showed EF of 55 to 60%.  No leg edema or JVD.  No SOB.  CHF is compensated. She  will be dialyzed again today.   HTN (hypertension) Blood pressure medicine was on hold at admission, blood pressure is not significant elevated.  As result, blood pressure medicine was adjusted to prevent hypotension.  Patient may need to restart some of the home medicines if blood pressure running high again in the nursing home.   ESRD on dialysis ([facility]): Potassium 4.6. Discussed with RN, patient will be dialyzed before discharge.   Cocaine use: -Did counsel about importance for quitting substance use   Obesity (BMI 30-39.9): Patient has Obesity Class  I, with body weight 77 Kg and BMI 30.03 kg/m2.  - Encourage losing weight - Exercise and healthy diet            Consultants: Neurology Procedures performed: None  Disposition: Skilled nursing facility Diet recommendation:  Discharge Diet Orders (From admission, onward)     Start     Ordered   01/22/24 0000  Diet - low sodium heart healthy        01/22/24 1010           Cardiac diet DISCHARGE MEDICATION: Allergies as of 01/22/2024       Reactions   Ceftriaxone Shortness Of Breath, Hives   Allergy, hives   Shellfish Allergy Hives   Minoxidil Swelling        Medication List     STOP taking these medications    amLODipine  10 MG tablet Commonly known as: NORVASC    aspirin  EC 81 MG tablet   hydrALAZINE  100 MG tablet Commonly known as: APRESOLINE    irbesartan  300 MG tablet Commonly known as: AVAPRO    meclizine  25 MG  tablet Commonly known as: ANTIVERT    terazosin  2 MG capsule Commonly known as: HYTRIN        TAKE these medications    acetaminophen  500 MG tablet Commonly known as: TYLENOL  Take 500 mg by mouth 3 (three) times daily.   atorvastatin  40 MG tablet Commonly known as: LIPITOR Take 40 mg by mouth daily.   calcitRIOL  0.25 MCG capsule Commonly known as: ROCALTROL  Take 1 capsule (0.25 mcg total) by mouth 4 (four) times a week. Take one capsule by mouth every Mon, Tue, Wed, Fri with  dialysis What changed:  how much to take additional instructions   calcium  carbonate 500 MG chewable tablet Commonly known as: TUMS - dosed in mg elemental calcium  Chew 2 tablets (400 mg of elemental calcium  total) by mouth 2 (two) times daily.   cinacalcet  60 MG tablet Commonly known as: SENSIPAR  Take 60 mg by mouth daily with supper.   cloNIDine  0.1 MG tablet Commonly known as: CATAPRES  Take 0.1 mg by mouth 2 (two) times daily as needed (Diastolic BP greater than 100 with manual BP).   cloNIDine  0.3 mg/24hr patch Commonly known as: CATAPRES  - Dosed in mg/24 hr Place 1 patch onto the skin once a week. On Fridays   Compound W One Step 40 % Pads Generic drug: Salicylic Acid Apply topically to right thumb every other day.   epoetin  alfa 4000 UNIT/ML injection Commonly known as: EPOGEN  Inject 1 mL (4,000 Units total) into the vein Every Tuesday,Thursday,and Saturday with dialysis.   fluticasone  50 MCG/ACT nasal spray Commonly known as: FLONASE  Place 1 spray into both nostrils daily as needed for allergies or rhinitis.   gabapentin  100 MG capsule Commonly known as: NEURONTIN  Take 200 mg by mouth 3 (three) times daily.   isosorbide  mononitrate 120 MG 24 hr tablet Commonly known as: IMDUR  Take 120 mg by mouth daily.   melatonin 3 MG Tabs tablet Take 6 mg by mouth at bedtime.   metoprolol  tartrate 50 MG tablet Commonly known as: LOPRESSOR  Take 50 mg by mouth 2 (two) times daily.   nitroGLYCERIN  0.4 MG SL tablet Commonly known as: NITROSTAT  Place 0.4 mg under the tongue every 5 (five) minutes as needed for chest pain.   pantoprazole  40 MG tablet Commonly known as: PROTONIX  Take 1 tablet (40 mg total) by mouth daily.   polyethylene glycol 17 g packet Commonly known as: MIRALAX  / GLYCOLAX  Take 17 g by mouth daily as needed.   senna-docusate 8.6-50 MG tablet Commonly known as: Senokot-S Take 2 tablets by mouth 2 (two) times daily as needed for mild constipation.    sevelamer  carbonate 800 MG tablet Commonly known as: RENVELA  Take 1,600 mg by mouth 3 (three) times daily.   Voltaren  1 % Gel Generic drug: diclofenac  Sodium Apply 2 g topically 2 (two) times daily. Apply to left shoulder.        Follow-up Information     [facility], [address] Follow up in 1 week(s).   Contact information: [address] [place] [address] [phone]                Discharge Exam: Allison Hill   01/21/24 2133  Weight: 77 kg   General exam: Appears calm and comfortable  Respiratory system: Clear to auscultation. Respiratory effort normal. Cardiovascular system: S1 & S2 heard, RRR. No JVD, murmurs, rubs, gallops or clicks. No pedal edema. Gastrointestinal system: Abdomen is nondistended, soft and nontender. No organomegaly or masses felt. Normal bowel sounds heard. Central nervous system: Alert  and oriented.  Left-sided weakness Extremities: Symmetric 5 x 5 power. Skin: No rashes, lesions or ulcers Psychiatry: Judgement and insight appear normal. Mood & affect appropriate.    Condition at discharge: good  The results of significant diagnostics from this hospitalization (including imaging, microbiology, ancillary and laboratory) are listed below for reference.   Imaging Studies: MR BRAIN WO CONTRAST Result Date: 01/22/2024 CLINICAL DATA:  Neuro deficit, acute, stroke suspected EXAM: MRI HEAD WITHOUT CONTRAST TECHNIQUE: Multiplanar, multiecho pulse sequences of the brain and surrounding structures were obtained without intravenous contrast. COMPARISON:  MRI head 12/18/2023. FINDINGS: Brain: No acute infarction, acute hemorrhage, hydrocephalus, extra-axial collection or mass lesion. 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 Many small foci of susceptibility artifact predominantly throughout the basal ganglia, thalami, brainstem and cerebellum bilaterally, compatible with chronic microhemorrhages. Vascular: Major arterial flow voids are  maintained at the skull base. Skull and upper cervical spine: Normal marrow signal. Sinuses/Orbits: Clear sinuses.  No acute orbital findings. Other: Right mastoid effusion. IMPRESSION: 1. No evidence of acute intracranial abnormality. 2. 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 3. Many chronic microhemorrhages in the basal ganglia, thalami, brainstem and cerebellum, likely the sequela of chronic hypertension. Electronically Signed   By: Gilmore GORMAN Molt M.D.   On: 01/22/2024 02:42   CT ANGIO HEAD NECK W WO CM (CODE STROKE) Result Date: 01/21/2024 CLINICAL DATA:  Left sided weakness/numbness EXAM: CT ANGIOGRAPHY HEAD AND NECK WITH AND WITHOUT CONTRAST TECHNIQUE: Multidetector CT imaging of the head and neck was performed using the standard protocol during bolus administration of intravenous contrast. Multiplanar CT image reconstructions and MIPs were obtained to evaluate the vascular anatomy. Carotid stenosis measurements (when applicable) are obtained utilizing NASCET criteria, using the distal internal carotid diameter as the denominator. RADIATION DOSE REDUCTION: This exam was performed according to the departmental dose-optimization program which includes automated exposure control, adjustment of the mA and/or kV according to patient size and/or use of iterative reconstruction technique. CONTRAST:  75mL OMNIPAQUE  IOHEXOL  350 MG/ML SOLN COMPARISON:  Same day CT head.  CTA head and neck 12/18/2023 FINDINGS: CTA NECK FINDINGS Aortic arch: Great vessel origins are patent without significant stenosis. Right carotid system: No evidence of dissection, stenosis (50% or greater), or occlusion. Left carotid system: No evidence of dissection, stenosis (50% or greater), or occlusion. Vertebral arteries: Left dominant. No evidence of dissection, stenosis (50% or greater), or occlusion. Skeleton: No acute abnormality. Other neck: No acute abnormality. Upper chest: No acute abnormality. Review of the MIP  images confirms the above findings CTA HEAD FINDINGS Anterior circulation: Bilateral intracranial ICAs, MCAs, and ACAs are patent without proximal hemodynamically significant stenosis. Posterior circulation: Bilateral intradural vertebral arteries, basilar artery and bilateral posterior cerebral arteries are patent without proximal hemodynamically significant stenosis. Venous sinuses: As permitted by contrast timing, patent. Review of the MIP images confirms the above findings IMPRESSION: No emergent large vessel occlusion or proximal hemodynamically significant stenosis. Electronically Signed   By: Gilmore GORMAN Molt M.D.   On: 01/21/2024 22:30   CT HEAD CODE STROKE WO CONTRAST Result Date: 01/21/2024 CLINICAL DATA:  Code stroke.  Neuro deficit, acute, stroke suspected EXAM: CT HEAD WITHOUT CONTRAST TECHNIQUE: Contiguous axial images were obtained from the base of the skull through the vertex without intravenous contrast. RADIATION DOSE REDUCTION: This exam was performed according to the departmental dose-optimization program which includes automated exposure control, adjustment of the mA and/or kV according to patient size and/or use of iterative reconstruction technique. COMPARISON:  CT head 12/18/2023. FINDINGS: Brain:  No evidence of acute large vascular territory infarction, hemorrhage, hydrocephalus, extra-axial collection or mass lesion/mass effect. Similar patchy white matter hypodensities. Vascular: Hyperdense appearance of the right M2 MCA (for example see series 6, image 12). Skull: No acute fracture. Sinuses/Orbits: Clear sinuses. ASPECTS [facility] Stroke Program Early CT Score) Total score (0-10 with 10 being normal): 10. IMPRESSION: 1. Hyperdense appearance of a right M2 MCA vessel, possibly thrombosed. Recommend stat CTA of the head/neck to further evaluate. 2. No convincing evidence of acute large vascular territory infarct. 3. No acute hemorrhage. Findings cos with Dr. Jossie via telephone at 9:56  PM. Electronically Signed   By: Gilmore GORMAN Molt M.D.   On: 01/21/2024 22:00    Microbiology: Results for orders placed or performed during the hospital encounter of 12/06/23  Resp panel by RT-PCR (RSV, Flu A&B, Covid) Anterior Nasal Swab     Status: None   Collection Time: 12/06/23  7:00 AM   Specimen: Anterior Nasal Swab  Result Value Ref Range Status   SARS Coronavirus 2 by RT PCR NEGATIVE NEGATIVE Final    Comment: (NOTE) SARS-CoV-2 target nucleic acids are NOT DETECTED.  The SARS-CoV-2 RNA is generally detectable in upper respiratory specimens during the acute phase of infection. The lowest concentration of SARS-CoV-2 viral copies this assay can detect is 138 copies/mL. A negative result does not preclude SARS-Cov-2 infection and should not be used as the sole basis for treatment or other patient management decisions. A negative result may occur with  improper specimen collection/handling, submission of specimen other than nasopharyngeal swab, presence of viral mutation(s) within the areas targeted by this assay, and inadequate number of viral copies(<138 copies/mL). A negative result must be combined with clinical observations, patient history, and epidemiological information. The expected result is Negative.  Fact Sheet for Patients:  BloggerCourse.com  Fact Sheet for Healthcare Providers:  SeriousBroker.it  This test is no t yet approved or cleared by the [place]  FDA and  has been authorized for detection and/or diagnosis of SARS-CoV-2 by FDA under an Emergency Use Authorization (EUA). This EUA will remain  in effect (meaning this test can be used) for the duration of the COVID-19 declaration under Section 564(b)(1) of the Act, 21 U.S.C.section 360bbb-3(b)(1), unless the authorization is terminated  or revoked sooner.       Influenza A by PCR NEGATIVE NEGATIVE Final   Influenza B by PCR NEGATIVE NEGATIVE Final     Comment: (NOTE) The Xpert Xpress SARS-CoV-2/FLU/RSV plus assay is intended as an aid in the diagnosis of influenza from Nasopharyngeal swab specimens and should not be used as a sole basis for treatment. Nasal washings and aspirates are unacceptable for Xpert Xpress SARS-CoV-2/FLU/RSV testing.  Fact Sheet for Patients: BloggerCourse.com  Fact Sheet for Healthcare Providers: SeriousBroker.it  This test is not yet approved or cleared by the [place]  FDA and has been authorized for detection and/or diagnosis of SARS-CoV-2 by FDA under an Emergency Use Authorization (EUA). This EUA will remain in effect (meaning this test can be used) for the duration of the COVID-19 declaration under Section 564(b)(1) of the Act, 21 U.S.C. section 360bbb-3(b)(1), unless the authorization is terminated or revoked.     Resp Syncytial Virus by PCR NEGATIVE NEGATIVE Final    Comment: (NOTE) Fact Sheet for Patients: BloggerCourse.com  Fact Sheet for Healthcare Providers: SeriousBroker.it  This test is not yet approved or cleared by the [place]  FDA and has been authorized for detection and/or diagnosis of SARS-CoV-2 by FDA under an Emergency  Use Authorization (EUA). This EUA will remain in effect (meaning this test can be used) for the duration of the COVID-19 declaration under Section 564(b)(1) of the Act, 21 U.S.C. section 360bbb-3(b)(1), unless the authorization is terminated or revoked.  Performed at [facility], [address] [place], [place] [address]     Labs: CBC: Recent Labs  Lab 01/21/24 2229  WBC 4.8  NEUTROABS 2.8  HGB 12.7  HCT 38.2  MCV 82.9  PLT 163   Basic Metabolic Panel: Recent Labs  Lab 01/21/24 2321  NA 136  K 4.6  CL 96*  CO2 23  GLUCOSE 98  BUN 79*  CREATININE 7.23*  CALCIUM  7.9*   Liver Function Tests: Recent Labs  Lab  01/21/24 2321  AST 17  ALT 8  ALKPHOS 333*  BILITOT 0.4  PROT 7.5  ALBUMIN 3.7   CBG: Recent Labs  Lab 01/21/24 2137  GLUCAP 99    Discharge time spent: 35 minutes  Signed: Murvin Mana, MD [facility] 01/22/2024

## 2024-01-22 NOTE — Progress Notes (Signed)
 SLP Cancellation Note  Patient Details Name: Allison Hill MRN: [id number] DOB: 10-06-1970   Cancelled treatment:       Reason Eval/Treat Not Completed: SLP screened, no needs identified, will sign off (chart reviewed; consulted NSG and met w/ pt in bed in hallway)   Pt  is a 53 y.o. female with medical history significant of ESRD-HD ([facility]), stroke with left-sided weakness, HTN, dCHF, chronic, obesity, migraine, cocaine abuse, alcohol abuse in remission, former smoker, who presents with left-sided numbness.   MRI of the brian showed: 1. No evidence of acute intracranial abnormality. 2. 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 3. Many chronic microhemorrhages in the basal ganglia, thalami, brainstem and cerebellum, likely the sequela of chronic hypertension.  Pt denied any difficulty swallowing and passed the Nursing swallow screen- MD consulted for diet order(regular diet); NSG agreed. She tolerates swallowing pills w/ water per NSG. Pt conversed in conversation w/out expressive/receptive deficits noted; pt denied any speech-language deficits. Speech clear. No further skilled ST services indicated as pt appears at her communication/swallowing baseline. Pt agreed. NSG to reconsult if any change in status while admitted.        Comer Portugal, MS, CCC-SLP Speech Language Pathologist Rehab Services; [facility] [phone] (ascom) Wendell Nicoson 01/22/2024, 11:27 AM

## 2024-01-22 NOTE — Progress Notes (Signed)
 Hemodialysis Note:  Received patient in bed to unit. Alert and oriented. Informed consent singed and in chart.  Treatment initiated: 1505 Treatment completed: 1809  Access used: Right Subclavian catheter Access issues: None  Patient tolerated well. Transported back to room, alert without acute distress. Report given to patient's RN.  Total UF removed: 1 liter Medications given: None  Post HD weight: 80.3 Kg  [name]

## 2024-01-22 NOTE — Progress Notes (Signed)
 [facility]  ROUNDING NOTE   Subjective:   Allison Hill  is a 53 y.o. female with end stage renal disease on hemodialysis, hypertension, congestive heart failure, migraines, intracranial hemorrhage, cocaine abuse and sickle cell trait. She presents to the ED with left sided weakness and has been admitted under observation for Stroke [facility]) [I63.9] Left sided numbness [R20.0].  Patient is known to our practice and is a long term resident at [facility] rehab where she receives her dialysis treatments. She is seen in the ED, states her last treatment was on Wednesday. Reported last seen well at 1930. All symptoms appear to have resolved at this time.  Brain MRI negative for acute findings.   We have been consulted to manage dialysis needs.    Objective:  Vital signs in last 24 hours:  Temp:  [97.7 F (36.5 C)-98.1 F (36.7 C)] 98.1 F (36.7 C) (08/22 1000) Pulse Rate:  [66-73] 70 (08/22 0830) Resp:  [12-21] 15 (08/22 1000) BP: (110-137)/(75-95) 129/87 (08/22 1200) SpO2:  [95 %-100 %] 98 % (08/22 1000) Weight:  [77 kg] 77 kg (08/21 2133)  Weight change:  Filed Weights   01/21/24 2133  Weight: 77 kg    Intake/Output: No intake/output data recorded.   Intake/Output this shift:  No intake/output data recorded.  Physical Exam: General: NAD, ill appearing  Head: Normocephalic, atraumatic. Moist oral mucosal membranes  Eyes: Anicteric  Lungs:  Clear to auscultation  Heart: Regular rate and rhythm  Abdomen:  Soft, nontender  Extremities:  No peripheral edema.  Neurologic: Awake, alert, conversant  Skin: Warm,dry, no rash  Access: Rt permcath    Basic Metabolic Panel: Recent Labs  Lab 01/21/24 2321  NA 136  K 4.6  CL 96*  CO2 23  GLUCOSE 98  BUN 79*  CREATININE 7.23*  CALCIUM  7.9*    Liver Function Tests: Recent Labs  Lab 01/21/24 2321  AST 17  ALT 8  ALKPHOS 333*  BILITOT 0.4  PROT 7.5  ALBUMIN 3.7   No results for input(s): LIPASE,  AMYLASE in the last 168 hours. No results for input(s): AMMONIA in the last 168 hours.  CBC: Recent Labs  Lab 01/21/24 2229  WBC 4.8  NEUTROABS 2.8  HGB 12.7  HCT 38.2  MCV 82.9  PLT 163    Cardiac Enzymes: No results for input(s): CKTOTAL, CKMB, CKMBINDEX, TROPONINI in the last 168 hours.  BNP: Invalid input(s): POCBNP  CBG: Recent Labs  Lab 01/21/24 2137  GLUCAP 99    Microbiology: Results for orders placed or performed during the hospital encounter of 12/06/23  Resp panel by RT-PCR (RSV, Flu A&B, Covid) Anterior Nasal Swab     Status: None   Collection Time: 12/06/23  7:00 AM   Specimen: Anterior Nasal Swab  Result Value Ref Range Status   SARS Coronavirus 2 by RT PCR NEGATIVE NEGATIVE Final    Comment: (NOTE) SARS-CoV-2 target nucleic acids are NOT DETECTED.  The SARS-CoV-2 RNA is generally detectable in upper respiratory specimens during the acute phase of infection. The lowest concentration of SARS-CoV-2 viral copies this assay can detect is 138 copies/mL. A negative result does not preclude SARS-Cov-2 infection and should not be used as the sole basis for treatment or other patient management decisions. A negative result may occur with  improper specimen collection/handling, submission of specimen other than nasopharyngeal swab, presence of viral mutation(s) within the areas targeted by this assay, and inadequate number of viral copies(<138 copies/mL). A negative result must be  combined with clinical observations, patient history, and epidemiological information. The expected result is Negative.  Fact Sheet for Patients:  BloggerCourse.com  Fact Sheet for Healthcare Providers:  SeriousBroker.it  This test is no t yet approved or cleared by the [place]  FDA and  has been authorized for detection and/or diagnosis of SARS-CoV-2 by FDA under an Emergency Use Authorization (EUA). This EUA  will remain  in effect (meaning this test can be used) for the duration of the COVID-19 declaration under Section 564(b)(1) of the Act, 21 U.S.C.section 360bbb-3(b)(1), unless the authorization is terminated  or revoked sooner.       Influenza A by PCR NEGATIVE NEGATIVE Final   Influenza B by PCR NEGATIVE NEGATIVE Final    Comment: (NOTE) The Xpert Xpress SARS-CoV-2/FLU/RSV plus assay is intended as an aid in the diagnosis of influenza from Nasopharyngeal swab specimens and should not be used as a sole basis for treatment. Nasal washings and aspirates are unacceptable for Xpert Xpress SARS-CoV-2/FLU/RSV testing.  Fact Sheet for Patients: BloggerCourse.com  Fact Sheet for Healthcare Providers: SeriousBroker.it  This test is not yet approved or cleared by the [place]  FDA and has been authorized for detection and/or diagnosis of SARS-CoV-2 by FDA under an Emergency Use Authorization (EUA). This EUA will remain in effect (meaning this test can be used) for the duration of the COVID-19 declaration under Section 564(b)(1) of the Act, 21 U.S.C. section 360bbb-3(b)(1), unless the authorization is terminated or revoked.     Resp Syncytial Virus by PCR NEGATIVE NEGATIVE Final    Comment: (NOTE) Fact Sheet for Patients: BloggerCourse.com  Fact Sheet for Healthcare Providers: SeriousBroker.it  This test is not yet approved or cleared by the [place]  FDA and has been authorized for detection and/or diagnosis of SARS-CoV-2 by FDA under an Emergency Use Authorization (EUA). This EUA will remain in effect (meaning this test can be used) for the duration of the COVID-19 declaration under Section 564(b)(1) of the Act, 21 U.S.C. section 360bbb-3(b)(1), unless the authorization is terminated or revoked.  Performed at [facility], [address] [place], [place]  [address]     Coagulation Studies: Recent Labs    01/21/24 02/13/54  LABPROT 13.3  INR 1.0    Urinalysis: No results for input(s): COLORURINE, LABSPEC, PHURINE, GLUCOSEU, HGBUR, BILIRUBINUR, KETONESUR, PROTEINUR, UROBILINOGEN, NITRITE, LEUKOCYTESUR in the last 72 hours.  Invalid input(s): APPERANCEUR    Imaging: MR BRAIN WO CONTRAST Result Date: 01/22/2024 CLINICAL DATA:  Neuro deficit, acute, stroke suspected EXAM: MRI HEAD WITHOUT CONTRAST TECHNIQUE: Multiplanar, multiecho pulse sequences of the brain and surrounding structures were obtained without intravenous contrast. COMPARISON:  MRI head 12/18/2023. FINDINGS: Brain: No acute infarction, acute hemorrhage, hydrocephalus, extra-axial collection or mass lesion. 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 Many small foci of susceptibility artifact predominantly throughout the basal ganglia, thalami, brainstem and cerebellum bilaterally, compatible with chronic microhemorrhages. Vascular: Major arterial flow voids are maintained at the skull base. Skull and upper cervical spine: Normal marrow signal. Sinuses/Orbits: Clear sinuses.  No acute orbital findings. Other: Right mastoid effusion. IMPRESSION: 1. No evidence of acute intracranial abnormality. 2. 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 3. Many chronic microhemorrhages in the basal ganglia, thalami, brainstem and cerebellum, likely the sequela of chronic hypertension. Electronically Signed   By: Gilmore GORMAN Molt M.D.   On: 01/22/2024 02:42   CT ANGIO HEAD NECK W WO CM (CODE STROKE) Result Date: 01/21/2024 CLINICAL DATA:  Left sided weakness/numbness EXAM: CT ANGIOGRAPHY HEAD AND NECK  WITH AND WITHOUT CONTRAST TECHNIQUE: Multidetector CT imaging of the head and neck was performed using the standard protocol during bolus administration of intravenous contrast. Multiplanar CT image reconstructions and MIPs were obtained to evaluate the vascular  anatomy. Carotid stenosis measurements (when applicable) are obtained utilizing NASCET criteria, using the distal internal carotid diameter as the denominator. RADIATION DOSE REDUCTION: This exam was performed according to the departmental dose-optimization program which includes automated exposure control, adjustment of the mA and/or kV according to patient size and/or use of iterative reconstruction technique. CONTRAST:  75mL OMNIPAQUE  IOHEXOL  350 MG/ML SOLN COMPARISON:  Same day CT head.  CTA head and neck 12/18/2023 FINDINGS: CTA NECK FINDINGS Aortic arch: Great vessel origins are patent without significant stenosis. Right carotid system: No evidence of dissection, stenosis (50% or greater), or occlusion. Left carotid system: No evidence of dissection, stenosis (50% or greater), or occlusion. Vertebral arteries: Left dominant. No evidence of dissection, stenosis (50% or greater), or occlusion. Skeleton: No acute abnormality. Other neck: No acute abnormality. Upper chest: No acute abnormality. Review of the MIP images confirms the above findings CTA HEAD FINDINGS Anterior circulation: Bilateral intracranial ICAs, MCAs, and ACAs are patent without proximal hemodynamically significant stenosis. Posterior circulation: Bilateral intradural vertebral arteries, basilar artery and bilateral posterior cerebral arteries are patent without proximal hemodynamically significant stenosis. Venous sinuses: As permitted by contrast timing, patent. Review of the MIP images confirms the above findings IMPRESSION: No emergent large vessel occlusion or proximal hemodynamically significant stenosis. Electronically Signed   By: Gilmore GORMAN Molt M.D.   On: 01/21/2024 22:30   CT HEAD CODE STROKE WO CONTRAST Result Date: 01/21/2024 CLINICAL DATA:  Code stroke.  Neuro deficit, acute, stroke suspected EXAM: CT HEAD WITHOUT CONTRAST TECHNIQUE: Contiguous axial images were obtained from the base of the skull through the vertex without  intravenous contrast. RADIATION DOSE REDUCTION: This exam was performed according to the departmental dose-optimization program which includes automated exposure control, adjustment of the mA and/or kV according to patient size and/or use of iterative reconstruction technique. COMPARISON:  CT head 12/18/2023. FINDINGS: Brain: No evidence of acute large vascular territory infarction, hemorrhage, hydrocephalus, extra-axial collection or mass lesion/mass effect. Similar patchy white matter hypodensities. Vascular: Hyperdense appearance of the right M2 MCA (for example see series 6, image 12). Skull: No acute fracture. Sinuses/Orbits: Clear sinuses. ASPECTS [facility] Stroke Program Early CT Score) Total score (0-10 with 10 being normal): 10. IMPRESSION: 1. Hyperdense appearance of a right M2 MCA vessel, possibly thrombosed. Recommend stat CTA of the head/neck to further evaluate. 2. No convincing evidence of acute large vascular territory infarct. 3. No acute hemorrhage. Findings cos with Dr. Jossie via telephone at 9:56 PM. Electronically Signed   By: Gilmore GORMAN Molt M.D.   On: 01/21/2024 22:00     Medications:    sodium chloride  40 mL/hr at 01/22/24 0124    [START ON 01/23/2024]  stroke: early stages of recovery book   Does not apply Once   aspirin   300 mg Rectal Daily   Or   aspirin   325 mg Oral Daily   atorvastatin   40 mg Oral q1800   calcitRIOL   0.25 mcg Oral Once per day on Monday Tuesday Wednesday Friday   Chlorhexidine  Gluconate Cloth  6 each Topical Q0600   cinacalcet   60 mg Oral Q supper   gabapentin   200 mg Oral TID   heparin   5,000 Units Subcutaneous Q8H   melatonin  5 mg Oral QHS   pantoprazole   40 mg Oral Q  breakfast   sevelamer  carbonate  1,600 mg Oral TID WC   acetaminophen  **OR** acetaminophen  (TYLENOL ) oral liquid 160 mg/5 mL **OR** acetaminophen , diphenhydrAMINE , hydrALAZINE , meclizine , senna-docusate  Assessment/ Plan:  Ms. Gilberte Gorley is a 53 y.o.  female  with end  stage renal disease on hemodialysis, hypertension, congestive heart failure, migraines, intracranial hemorrhage, cocaine abuse and sickle cell trait Patient is admitted under observation for Stroke [facility]) [I63.9] Left sided numbness [R20.0]   Numbness, left sided. Workup negative for stroke. History of stroke present. MRI of brain negative.   2. End stage renal disease on hemodialysis. Last treatment received on Wednesday. Will receive dialysis today. Next treatment scheduled for Monday  3. Anemia of chronic kidney disease Lab Results  Component Value Date   HGB 12.7 01/21/2024   Hgb at optimal range.   4. Secondary Hyperparathyroidism: with outpatient labs: PTH 1728, phosphorus 3.8, calcium  8.3 on 01/05/24.   Lab Results  Component Value Date   PTH 1,248 (H) 08/11/2023   CALCIUM  7.9 (L) 01/21/2024   PHOS 5.1 (H) 11/11/2023    Will continue to monitor bone minerals.    LOS: 0 Zellie Jenning 8/22/20251:58 PM

## 2024-01-22 NOTE — ED Notes (Signed)
 Report given to dialysis.

## 2024-01-22 NOTE — ED Notes (Signed)
Patient assisted to bathroom via wheelchair.

## 2024-01-22 NOTE — ED Notes (Signed)
 Spoke with Erminio Cone, NP regarding hypertension and pt is cleared to return to [facility] and take night BP meds, [facility] transport aware, NAD noted

## 2024-01-22 NOTE — H&P (Signed)
 History and Physical    Allison Hill FMW:[id number] DOB: 08-May-1971 DOA: 01/21/2024  Referring MD/NP/PA:   PCP: [facility] [address]   Patient coming from:  The patient is coming from SNF   Chief Complaint: Left-sided numbness  HPI: Allison Hill is a 53 y.o. female with medical history significant of ESRD-HD ([facility]), stroke with left-sided weakness, HTN, dCHF, chronic, obesity, migraine, cocaine abuse, alcohol abuse in remission, former smoker, who presents with left-sided numbness.  Patient states that she has history of stroke with left-sided weakness. She developed left-sided numbness in left arm and leg around 7:30 PM.  She does not have new worsening weakness.  No facial droop or slurred speech.  No chest pain, cough, SOB.  No nausea, vomiting, diarrhea or abdominal pain.  No symptoms of UTI.  No fever or chills.  She had dialysis on Wednesday.  Patient states she has not been taking aspirin  for a while.  Data reviewed independently and ED Course: pt was found to have WBC 4.8, potassium 4.6, bicarbonate 27, creatinine 7.23, BUN 79, INR 1.0, PTT 28, alcohol level less than 15.  Temperature normal, blood pressure 118/75, heart rate 73, RR 20, oxygen saturation 99% on room air.  Patient is placed on telemetry bed for observation.  Teleneurology was consulted.  CT of head: 1. Hyperdense appearance of a right M2 MCA vessel, possibly thrombosed. Recommend stat CTA of the head/neck to further evaluate. 2. No convincing evidence of acute large vascular territory infarct. 3. No acute hemorrhage.  CTA of head and neck: No emergent large vessel occlusion or proximal hemodynamically significant stenosis.     EKG: I have personally reviewed sinus rhythm, QTc 519, LAE, mild T wave inversion in V5-V6   Review of Systems:   General: no fevers, chills, no body weight gain, has fatigue HEENT: no blurry vision, hearing changes or sore throat Respiratory: no dyspnea, coughing,  wheezing CV: no chest pain, no palpitations GI: no nausea, vomiting, abdominal pain, diarrhea, constipation GU: no dysuria, burning on urination, increased urinary frequency, hematuria  Ext: no leg edema Neuro: no vision change or hearing loss.  Has chronic left-sided weakness, has new left arm and leg numbness Skin: no rash, no skin tear. MSK: No muscle spasm, no deformity, no limitation of range of movement in spin Heme: No easy bruising.  Travel history: No recent long distant travel.   Allergy:  Allergies  Allergen Reactions   Ceftriaxone Shortness Of Breath and Hives    Allergy, hives   Shellfish Allergy Hives   Minoxidil Swelling    Past Medical History:  Diagnosis Date   CHF (congestive heart failure) (HCC)    Hypertension    Renal disorder     History reviewed. No pertinent surgical history.  Social History:  reports that she has quit smoking. Her smoking use included cigarettes. She has never used smokeless tobacco. She reports that she does not currently use alcohol. She reports current drug use. Drug: Codeine.  Family History:  Family History  Problem Relation Age of Onset   Heart failure Mother    CAD Brother      Prior to Admission medications   Medication Sig Start Date End Date Taking? Authorizing Provider  acetaminophen  (TYLENOL ) 500 MG tablet Take 500 mg by mouth 3 (three) times daily.    [provider]  amLODipine  (NORVASC ) 10 MG tablet Take 10 mg by mouth daily.    [provider]  aspirin  EC 81 MG tablet Take 81 mg by mouth  daily.    [provider]  atorvastatin  (LIPITOR) 40 MG tablet Take 40 mg by mouth daily. 07/05/23   [provider]  calcitRIOL  (ROCALTROL ) 0.25 MCG capsule Take 1 capsule (0.25 mcg total) by mouth 4 (four) times a week. Take one capsule by mouth every Mon, Tue, Wed, Fri with dialysis 12/10/23   Dezii, Alexandra, DO  calcium  carbonate (TUMS - DOSED IN MG ELEMENTAL CALCIUM ) 500 MG chewable tablet  Chew 2 tablets (400 mg of elemental calcium  total) by mouth 2 (two) times daily. 09/23/23   Awanda City, MD  cinacalcet  (SENSIPAR ) 60 MG tablet Take 60 mg by mouth daily with supper. 11/30/23   [provider]  cloNIDine  (CATAPRES  - DOSED IN MG/24 HR) 0.3 mg/24hr patch Place 1 patch onto the skin once a week. On Fridays    [provider]  cloNIDine  (CATAPRES ) 0.1 MG tablet Take 0.1 mg by mouth 2 (two) times daily as needed (Diastolic BP greater than 100 with manual BP). 10/06/23   [provider]  diclofenac  Sodium (VOLTAREN ) 1 % GEL Apply 2 g topically 2 (two) times daily. Apply to left shoulder.    [provider]  epoetin  alfa (EPOGEN ) 4000 UNIT/ML injection Inject 1 mL (4,000 Units total) into the vein Every Tuesday,Thursday,and Saturday with dialysis. Patient taking differently: Inject 4,000 Units into the vein 4 (four) times a week. On Mon, Tues, Wed, Fri at dialysis 09/24/23   Awanda City, MD  fluticasone  (FLONASE ) 50 MCG/ACT nasal spray Place 1 spray into both nostrils daily as needed for allergies or rhinitis. 09/23/23   Awanda City, MD  gabapentin  (NEURONTIN ) 100 MG capsule Take 200 mg by mouth 3 (three) times daily.    [provider]  hydrALAZINE  (APRESOLINE ) 100 MG tablet Take 100 mg by mouth 3 (three) times daily. 12/26/22   [provider]  irbesartan  (AVAPRO ) 300 MG tablet Take 1 tablet (300 mg total) by mouth every evening. 09/23/23   Awanda City, MD  isosorbide  mononitrate (IMDUR ) 120 MG 24 hr tablet Take 120 mg by mouth daily.    [provider]  meclizine  (ANTIVERT ) 25 MG tablet Take 1 tablet (25 mg total) by mouth 2 (two) times daily as needed for dizziness. 10/27/23   Patel, Sona, MD  melatonin 3 MG TABS tablet Take 6 mg by mouth at bedtime. 12/26/22   [provider]  metoprolol  tartrate (LOPRESSOR ) 50 MG tablet Take 50 mg by mouth 2 (two) times daily. 11/12/23   [provider]  nitroGLYCERIN  (NITROSTAT ) 0.4 MG SL  tablet Place 0.4 mg under the tongue every 5 (five) minutes as needed for chest pain. 05/29/23   [provider]  pantoprazole  (PROTONIX ) 40 MG tablet Take 1 tablet (40 mg total) by mouth daily. 09/24/23   Awanda City, MD  polyethylene glycol (MIRALAX  / GLYCOLAX ) 17 g packet Take 17 g by mouth daily as needed. 09/23/23   Awanda City, MD  senna-docusate (SENOKOT-S) 8.6-50 MG tablet Take 2 tablets by mouth 2 (two) times daily as needed for mild constipation. 09/23/23   Awanda City, MD  sevelamer  carbonate (RENVELA ) 800 MG tablet Take 1,600 mg by mouth 3 (three) times daily.    [provider]  terazosin  (HYTRIN ) 2 MG capsule Take 2 mg by mouth 2 (two) times daily.    [provider]    Physical Exam: Vitals:   01/21/24 2219 01/21/24 2230 01/21/24 2255 01/21/24 2300  BP: (!) 137/95 (!) 133/92  118/75  Pulse:  70  68 68  Resp:  19 13 14   Temp:      TempSrc:      SpO2:  95% 98% 99%  Weight:      Height:       General: Not in acute distress HEENT:       Eyes: PERRL, EOMI, no jaundice       ENT: No discharge from the ears and nose, no pharynx injection, no tonsillar enlargement.        Neck: No JVD, no bruit, no mass felt. Heme: No neck lymph node enlargement. Cardiac: S1/S2, RRR, No murmurs, No gallops or rubs. Respiratory: No rales, wheezing, rhonchi or rubs. GI: Soft, nondistended, nontender, no rebound pain, no organomegaly, BS present. GU: No hematuria Ext: No pitting leg edema bilaterally. 1+DP/PT pulse bilaterally. Musculoskeletal: No joint deformities, No joint redness or warmth, no limitation of ROM in spin. Skin: No rashes.  Neuro: Alert, oriented X3, cranial nerves II-XII grossly intact.  Muscle strength 2/5 in left leg and 1/5 in left arm. Has decreased sensation to light touch in left arm and leg  Psych: Patient is not psychotic, no suicidal or hemocidal ideation.  Labs on Admission: I have personally reviewed following labs and imaging  studies  CBC: Recent Labs  Lab 01/21/24 2229  WBC 4.8  NEUTROABS 2.8  HGB 12.7  HCT 38.2  MCV 82.9  PLT 163   Basic Metabolic Panel: Recent Labs  Lab 01/21/24 2321  NA 136  K 4.6  CL 96*  CO2 23  GLUCOSE 98  BUN 79*  CREATININE 7.23*  CALCIUM  7.9*   GFR: Estimated Creatinine Clearance: 8.9 mL/min (A) (by C-G formula based on SCr of 7.23 mg/dL (H)). Liver Function Tests: Recent Labs  Lab 01/21/24 2321  AST 17  ALT 8  ALKPHOS 333*  BILITOT 0.4  PROT 7.5  ALBUMIN 3.7   No results for input(s): LIPASE, AMYLASE in the last 168 hours. No results for input(s): AMMONIA in the last 168 hours. Coagulation Profile: Recent Labs  Lab 01/21/24 2155  INR 1.0   Cardiac Enzymes: No results for input(s): CKTOTAL, CKMB, CKMBINDEX, TROPONINI in the last 168 hours. BNP (last 3 results) No results for input(s): PROBNP in the last 8760 hours. HbA1C: No results for input(s): HGBA1C in the last 72 hours. CBG: Recent Labs  Lab 01/21/24 2137  GLUCAP 99   Lipid Profile: No results for input(s): CHOL, HDL, LDLCALC, TRIG, CHOLHDL, LDLDIRECT in the last 72 hours. Thyroid Function Tests: No results for input(s): TSH, T4TOTAL, FREET4, T3FREE, THYROIDAB in the last 72 hours. Anemia Panel: No results for input(s): VITAMINB12, FOLATE, FERRITIN, TIBC, IRON, RETICCTPCT in the last 72 hours. Urine analysis:    Component Value Date/Time   COLORURINE STRAW (A) 12/06/2023 0810   APPEARANCEUR CLEAR (A) 12/06/2023 0810   LABSPEC 1.008 12/06/2023 0810   PHURINE 8.0 12/06/2023 0810   GLUCOSEU NEGATIVE 12/06/2023 0810   HGBUR NEGATIVE 12/06/2023 0810   BILIRUBINUR NEGATIVE 12/06/2023 0810   KETONESUR NEGATIVE 12/06/2023 0810   PROTEINUR 30 (A) 12/06/2023 0810   UROBILINOGEN 0.2 05/24/2007 1054   NITRITE NEGATIVE 12/06/2023 0810   LEUKOCYTESUR NEGATIVE 12/06/2023 0810   Sepsis Labs: @LABRCNTIP (procalcitonin:4,lacticidven:4) )No  results found for this or any previous visit (from the past 240 hours).   Radiological Exams on Admission:   Assessment/Plan Principal Problem:   Left sided numbness Active Problems:   History of stroke   Chronic heart failure with preserved ejection fraction (HFpEF) (HCC)   HTN (hypertension)  ESRD on dialysis [facility])   Cocaine use   Obesity (BMI 30-39.9)   Assessment and Plan:   Left sided numbness and hx of stroke: Patient has chronic left-sided weakness, now has new left-sided numbness, concerning for new stroke. CT-head negative for intracranial hemorrhage.  CTA negative for LVO.  Patient is not taking aspirin  currently.  Dr. Azalee of teleneurology was consulted, recommended to stroke workup.  -Placed on tele bed for observation - Obtain MRI-brain  - will hold oral Bp meds to allow permissive HTN  - resume ASA - Statin: lipitor 40 mg daily - fasting lipid panel and HbA1c  - 2D transthoracic echocardiography  - swallowing screen. If fails, will get SLP - Check UDS  - PT/OT consult  Chronic heart failure with preserved ejection fraction (HFpEF) (HCC): 2D echo on 12/08/2023 showed EF of 55 to 60%.  No leg edema or JVD.  No SOB.  CHF is compensated. - Volume management per renal by dialysis.  HTN (hypertension) - prn IV hydralazine  for SBP>220 or dBP>110  ESRD on dialysis ([facility]): Potassium 4.6. -Consulted Dr. Douglas of renal for dialysis  Cocaine use: -Did counsel about importance for quitting substance use - Check  UDS  Obesity (BMI 30-39.9): Patient has Obesity Class  I, with body weight 77 Kg and BMI 30.03 kg/m2.  - Encourage losing weight - Exercise and healthy diet      DVT ppx: SQ Heparin      Code Status: Full code    Family Communication:     not done, no family member is at bed side.      Disposition Plan:  Anticipate discharge back to previous environment, SNF  Consults called:  Dr. Azalee of teleneurology  Admission status and Level of  care: Telemetry Medical:    for obs as inpt          Dispo: The patient is from: SNF              Anticipated d/c is to: SNF              Anticipated d/c date is: 1 day              Patient currently is not medically stable to d/c.    Severity of Illness:  The appropriate patient status for this patient is OBSERVATION. Observation status is judged to be reasonable and necessary in order to provide the required intensity of service to ensure the patient's safety. The patient's presenting symptoms, physical exam findings, and initial radiographic and laboratory data in the context of their medical condition is felt to place them at decreased risk for further clinical deterioration. Furthermore, it is anticipated that the patient will be medically stable for discharge from the hospital within 2 midnights of admission.        Date of Service 01/22/2024    Caleb Exon [facility]   If 7PM-7AM, please contact night-coverage [email or web address] 01/22/2024, 12:54 AM

## 2024-01-22 NOTE — Plan of Care (Signed)
 Chart review note Patient has been seen overnight by telestroke MD for concern for left-sided weakness Multiple similar presentations in the past MRI brain negative for acute process No further inpatient neurological workup needed at this time I am canceling the echocardiogram. Plan relayed to Dr. Laurita Eligio Lav, MD Neurology

## 2024-01-22 NOTE — ED Notes (Signed)
 Pt assisted to bathroom via wheelchair, instructed to call for assist when ready

## 2024-01-22 NOTE — TOC CM/SW Note (Signed)
..  Transition of Care [facility]) - Inpatient Brief Assessment   Patient Details  Name: Allison Hill MRN: [id number] Date of Birth: 1970-12-02  [facility] of [name]) CM/SW Contact:    Edsel DELENA Fischer, LCSW Phone Number: 01/22/2024, 10:11 AM   Clinical Narrative:  SW spoke with Dr. Laurita.  Dr. Laurita expressed that pt will be discharging today.  SW reached out to see if pt is able to return to [facility]. Staff expressed that pt is able to return.  SW notified ED staff  Transition of Care Asessment:

## 2024-01-23 ENCOUNTER — Emergency Department
Admission: EM | Admit: 2024-01-23 | Discharge: 2024-01-24 | Disposition: A | Discharge status: Home / Self Care | Attending: Emergency Medicine | Admitting: Emergency Medicine

## 2024-01-23 DIAGNOSIS — M545 Low back pain, unspecified: Secondary | ICD-10-CM | POA: Diagnosis present

## 2024-01-23 DIAGNOSIS — Z79899 Other long term (current) drug therapy: Secondary | ICD-10-CM | POA: Diagnosis not present

## 2024-01-23 DIAGNOSIS — I160 Hypertensive urgency: Secondary | ICD-10-CM | POA: Insufficient documentation

## 2024-01-23 DIAGNOSIS — R109 Unspecified abdominal pain: Secondary | ICD-10-CM | POA: Diagnosis not present

## 2024-01-23 MED ORDER — MORPHINE SULFATE (PF) 4 MG/ML IV SOLN
4.0000 mg | Freq: Once | INTRAVENOUS | Status: AC
  Administered 2024-01-24: 4 mg via INTRAVENOUS
  Filled 2024-01-23: qty 1

## 2024-01-23 MED ORDER — HYDRALAZINE HCL 20 MG/ML IJ SOLN
5.0000 mg | Freq: Once | INTRAMUSCULAR | Status: AC
  Administered 2024-01-24: 5 mg via INTRAVENOUS
  Filled 2024-01-23: qty 1

## 2024-01-23 NOTE — ED Triage Notes (Signed)
 Chief Complaint  Patient presents with   Hypertension   Back Pain   Pt reports HTN. Has hx of same. Report she took her prescribed meds at 2150 tonight. Pt also c/o back pain x2-3 days. Pt rates pain 10/10. Pt noted to have previous deficits on left side from previous stroke.  BP (!) 179/115   Pulse 68   Temp 98.5 F (36.9 C) (Oral)   Resp 20   Ht 5' 3 (1.6 m)   Wt 80.3 kg   SpO2 100%   BMI 31.36 kg/m   Past Medical History:  Diagnosis Date   CHF (congestive heart failure) (HCC)    Hypertension    Renal disorder

## 2024-01-24 ENCOUNTER — Emergency Department

## 2024-01-24 DIAGNOSIS — M545 Low back pain, unspecified: Secondary | ICD-10-CM | POA: Diagnosis not present

## 2024-01-24 LAB — CBC WITH DIFFERENTIAL/PLATELET
Abs Immature Granulocytes: 0.01 K/uL (ref 0.00–0.07)
Basophils Absolute: 0 K/uL (ref 0.0–0.1)
Basophils Relative: 0 %
Eosinophils Absolute: 0.1 K/uL (ref 0.0–0.5)
Eosinophils Relative: 2 %
HCT: 38.3 % (ref 36.0–46.0)
Hemoglobin: 12.6 g/dL (ref 12.0–15.0)
Immature Granulocytes: 0 %
Lymphocytes Relative: 24 %
Lymphs Abs: 1.3 K/uL (ref 0.7–4.0)
MCH: 27.6 pg (ref 26.0–34.0)
MCHC: 32.9 g/dL (ref 30.0–36.0)
MCV: 83.8 fL (ref 80.0–100.0)
Monocytes Absolute: 0.7 K/uL (ref 0.1–1.0)
Monocytes Relative: 13 %
Neutro Abs: 3.4 K/uL (ref 1.7–7.7)
Neutrophils Relative %: 61 %
Platelets: 184 K/uL (ref 150–400)
RBC: 4.57 MIL/uL (ref 3.87–5.11)
RDW: 16.4 % — ABNORMAL HIGH (ref 11.5–15.5)
WBC: 5.6 K/uL (ref 4.0–10.5)
nRBC: 0 % (ref 0.0–0.2)

## 2024-01-24 LAB — COMPREHENSIVE METABOLIC PANEL WITH GFR
ALT: 8 U/L (ref 0–44)
AST: 26 U/L (ref 15–41)
Albumin: 3.7 g/dL (ref 3.5–5.0)
Alkaline Phosphatase: 337 U/L — ABNORMAL HIGH (ref 38–126)
Anion gap: 15 (ref 5–15)
BUN: 90 mg/dL — ABNORMAL HIGH (ref 6–20)
CO2: 24 mmol/L (ref 22–32)
Calcium: 8 mg/dL — ABNORMAL LOW (ref 8.9–10.3)
Chloride: 100 mmol/L (ref 98–111)
Creatinine, Ser: 8.03 mg/dL — ABNORMAL HIGH (ref 0.44–1.00)
GFR, Estimated: 6 mL/min — ABNORMAL LOW (ref >60)
Glucose, Bld: 106 mg/dL — ABNORMAL HIGH (ref 70–99)
Potassium: 5 mmol/L (ref 3.5–5.1)
Sodium: 139 mmol/L (ref 135–145)
Total Bilirubin: 0.6 mg/dL (ref 0.0–1.2)
Total Protein: 7.6 g/dL (ref 6.5–8.1)

## 2024-01-24 LAB — LIPASE, BLOOD: Lipase: 44 U/L (ref 11–51)

## 2024-01-24 MED ORDER — HYDROCODONE-ACETAMINOPHEN 5-325 MG PO TABS
1.0000 | ORAL_TABLET | Freq: Once | ORAL | Status: AC
  Administered 2024-01-24: 1 via ORAL
  Filled 2024-01-24: qty 1

## 2024-01-24 MED ORDER — AMLODIPINE BESYLATE 10 MG PO TABS: 10.0000 mg | ORAL_TABLET | Freq: Every day | ORAL | 14 refills | Status: AC

## 2024-01-24 MED ORDER — IOHEXOL 350 MG/ML SOLN
100.0000 mL | Freq: Once | INTRAVENOUS | Status: AC | PRN
  Administered 2024-01-24: 100 mL via INTRAVENOUS

## 2024-01-24 NOTE — Discharge Instructions (Addendum)
 You were seen in the ER today for your back pain and elevated blood pressure.  Your testing fortunately did not show an emergency cause for this.  I suspect her elevated blood pressure is partly related to discontinuing several of your medications.  I recommend resuming your amlodipine  10 mg daily and continuing to monitor your blood pressure.  If you continue to have high blood pressure levels, you may need to restart your other medicines as well.  Follow with your primary care doctor for further evaluation.  Return to the ER for new or worsening symptoms.

## 2024-01-24 NOTE — ED Notes (Signed)
 Fall bundle is in place

## 2024-01-24 NOTE — ED Provider Notes (Signed)
 [facility] Provider Note    Event Date/Time   First MD Initiated Contact with Patient 01/23/24 2256     (approximate)   History   Hypertension and Back Pain   HPI  Allison Hill is a 53 year old female presenting to the emergency department for evaluation of back pain and elevated blood pressure.  Patient reports she has a history of back pain but had worsening pain tonight around 9:50 PM.  Additionally reports some abdominal pain.  No nausea or vomiting.  Denies chest pain.  I reviewed her discharge summary from 01/22/2024.  At that time she presented with worsening left-sided symptoms concerning for new stroke, but MRI without acute finding.  While here, blood pressure medicine was held and not noted to be significantly elevated, so it does appear per her discharge summary her amlodipine , hydralazine , and irbesartan  were all held with comment that these need to be resumed if she has refractory hypertension.     Physical Exam   Triage Vital Signs: ED Triage Vitals  Encounter Vitals Group     BP 01/23/24 2242 (!) 179/115     Girls Systolic BP Percentile --      Girls Diastolic BP Percentile --      Boys Systolic BP Percentile --      Boys Diastolic BP Percentile --      Pulse Rate 01/23/24 2242 68     Resp 01/23/24 2242 20     Temp 01/23/24 2242 98.5 F (36.9 C)     Temp Source 01/23/24 2242 Oral     SpO2 01/23/24 2242 100 %     Weight 01/23/24 2240 177 lb 0.5 oz (80.3 kg)     Height 01/23/24 2240 5' 3 (1.6 m)     Head Circumference --      Peak Flow --      Pain Score 01/24/24 0041 10     Pain Loc --      Pain Education --      Exclude from Growth Chart --     Most recent vital signs: Vitals:   01/24/24 0230 01/24/24 0245  BP: (!) 145/96 (!) 143/97  Pulse: 71 70  Resp:    Temp:    SpO2: 100% 100%     General: Awake, interactive  CV:  Regular rate, good peripheral perfusion.  Resp:  Unlabored respirations.  Abd:  Nondistended,  soft, generalized tenderness to palpation without rebound or guarding Neuro:  Symmetric facial movement, fluid speech   ED Results / Procedures / Treatments   Labs (all labs ordered are listed, but only abnormal results are displayed) Labs Reviewed  CBC WITH DIFFERENTIAL/PLATELET - Abnormal; Notable for the following components:      Result Value   RDW 16.4 (*)    All other components within normal limits  COMPREHENSIVE METABOLIC PANEL WITH GFR - Abnormal; Notable for the following components:   Glucose, Bld 106 (*)    BUN 90 (*)    Creatinine, Ser 8.03 (*)    Calcium  8.0 (*)    Alkaline Phosphatase 337 (*)    GFR, Estimated 6 (*)    All other components within normal limits  LIPASE, BLOOD     EKG EKG independently reviewed and interpreted by myself demonstrates:    RADIOLOGY Imaging independently reviewed and interpreted by myself demonstrates:  CT dissection protocol without aortic dissection.  Radiology notes atherosclerosis, cardiomegaly, prominent pulmonary trunk but no pulmonary embolism.  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  Nonspecific  presacral edema noted, patient without clinical symptoms suggestive of VTE  Formal Radiology Read:  CT Angio Chest/Abd/Pel for Dissection W and/or Wo Contrast Result Date: 01/24/2024 CLINICAL DATA:  Renal failure patient on dialysis with hypertension and worsening back pain. EXAM: CT ANGIOGRAPHY CHEST, ABDOMEN AND PELVIS TECHNIQUE: Non-contrast CT of the chest was initially obtained. Multidetector CT imaging through the chest, abdomen and pelvis was performed using the standard protocol during bolus administration of intravenous contrast. Multiplanar reconstructed images and MIPs were obtained and reviewed to evaluate the vascular anatomy. RADIATION DOSE REDUCTION: This exam was performed according to the departmental dose-optimization program which includes automated exposure control, adjustment of the mA and/or kV according to patient size and/or  use of iterative reconstruction technique. CONTRAST:  OMNIPAQUE  IOHEXOL  350 MG/ML SOLN COMPARISON:  Last chest x-Margaux Engen was portable chest 12/06/2023. Comparison is made with CTA chest, abdomen and pelvis dated 12/18/2023 and CTA chest 11/27/2023. FINDINGS: CTA CHEST FINDINGS Cardiovascular: Right IJ dialysis catheter again terminates at the superior cavoatrial junction. Stable moderate cardiomegaly with left ventricular and septal wall hypertrophy and left chamber predominance. Stable central venous distention. There is no pericardial effusion. There scattered 2 vessel calcific plaques in the LAD and circumflex coronary arteries. There is about equal opacification of the aorta and pulmonary arteries. Pulmonary trunk slightly prominent 3.1 cm as before without evidence of arterial embolism. There are calcific plaques in the aortic isthmus and proximal descending segment, calcific plaque in the ascending aorta but no aortic or great vessel aneurysm, stenosis or dissection. Mediastinum/Nodes: There is more than the typical amount of substernal thymus for a 53 year old, but this was seen previously and unchanged. There is no intrathoracic or axillary adenopathy. The lower poles of the thyroid gland, thoracic trachea, main bronchi, thoracic esophagus are unremarkable. 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 Lungs/Pleura: Chronic scarring medial basal left lower lobe. Mild posterior atelectasis. No consolidation, effusion or visible nodules. Central airways are patent. Musculoskeletal: Dense bones consistent with renal osteodystrophy. No acute or other significant osseous findings. The ribcage is intact. Review of the MIP images confirms the above findings. CTA ABDOMEN AND PELVIS FINDINGS VASCULAR Aorta: Normal caliber aorta without aneurysm, dissection, vasculitis or significant stenosis.  There is mild-to-moderate patchy calcific plaque. Celiac: Patent without evidence of aneurysm, dissection, vasculitis or significant stenosis. There are mild nonstenosing calcific plaques. SMA: Patent without evidence of aneurysm, dissection, vasculitis or significant stenosis. Renals: Both are single with mild calcific plaque proximally. Both are widely patent. IMA: Patent without evidence of aneurysm, dissection, vasculitis or significant stenosis. Inflow: Patent without evidence of aneurysm, dissection, vasculitis or significant stenosis. There are patchy nonstenosing calcifications in the common iliac, internal iliac and both distal external iliac arteries Veins: No obvious venous abnormality within the limitations of this arterial phase study. Review of the MIP images confirms the above findings. NON-VASCULAR Hepatobiliary: There is a 7 mm focal hyperenhancement in the dome of segment 8, most likely a flash filling hemangioma, seen previously. Otherwise no focal liver abnormality is seen. No gallstones, gallbladder wall thickening, or biliary dilatation. Pancreas: No abnormality. Spleen: No abnormality. Adrenals/Urinary Tract: There are multiple small subcentimeter Bosniak 2 renal cortical cysts which are too small to characterize. No follow-up imaging recommended. There is no mass enhancement in the kidneys. There is slight nodular thickening in the adrenal glands, stable. Both kidneys are small, showing moderate symmetric volume loss. There is no  urinary stone or obstruction. There is contrast in the bladder. The bladder is unremarkable for the degree of distension. Stomach/Bowel: Chronic thickened folds in the stomach. Unremarkable unopacified small bowel. Normal retrocecal appendix. Moderate diffuse fecal stasis. Scattered diverticulosis without diverticulitis. Lymphatic: No adenopathy is seen. Reproductive: Uterus and bilateral adnexa are unremarkable. Other: There is presacral edema not seen previously,  at the level of the common iliac and proximal to mid internal iliac vessels, which could be seen with veno-occlusive disease such as thrombophlebitis, otherwise would be in keeping with a nonspecific inflammatory process. There is trace ascites in the posterior pelvis. No free hemorrhage or free air or abscess. No incarcerated hernia. Musculoskeletal: Dense bones. No acute or other significant osseous findings. Lumbar facet hypertrophy. Review of the MIP images confirms the above findings. IMPRESSION: 1. Aortic and coronary artery atherosclerosis. No aortic aneurysm, dissection or stenosis. 2. Cardiomegaly with left ventricular and septal wall hypertrophy and left chamber predominance. 3. Slightly prominent pulmonary trunk but no findings of acute right heart strain. No arterial embolism is seen. 4. 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 5. Chronic thickened folds in the stomach. 6. Constipation and diverticulosis. 7. Presacral edema not seen previously, which could be seen with veno-occlusive disease such as thrombophlebitis, otherwise would be in keeping with a nonspecific inflammatory process. 8. Trace ascites in the posterior pelvis. 9. Renal osteodystrophy. Aortic Atherosclerosis (ICD10-I70.0). Electronically Signed   By: Francis Quam M.D.   On: 01/24/2024 02:46    PROCEDURES:  Critical Care performed: No  Procedures   MEDICATIONS ORDERED IN ED: Medications  hydrALAZINE  (APRESOLINE ) injection 5 mg (5 mg Intravenous Given 01/24/24 0041)  morphine  (PF) 4 MG/ML injection 4 mg (4 mg Intravenous Given 01/24/24 0041)  iohexol  (OMNIPAQUE ) 350 MG/ML injection 100 mL (100 mLs Intravenous Contrast Given 01/24/24 0103)     IMPRESSION / MDM / ASSESSMENT AND PLAN / ED COURSE  I reviewed the triage vital signs and the nursing notes.  Differential diagnosis includes, but is not limited to, aortic dissection, exacerbation of chronic back pain,  pancreatitis, other acute intra-abdominal process, hypertension due to recent discontinuation of multiple antihypertensives, no new bowel or bladder symptoms, leg weakness, other findings suggestive of acute spinal cord pathology  Patient's presentation is most consistent with acute presentation with potential threat to life or bodily function.  53 year old female presenting to the emergency department for evaluation of back pain, noted to have elevated blood pressure on presentation here.  Does appear to have at least subacute component, but with report of acute change in character this evening, elevated blood pressure, risk factors, CT dissection protocol was ordered.  Labs reassuring.  Given a dose of hydralazine  here as this is one of the meds that was held for patient with improvement in her blood pressure.  Ordered for morphine  for pain control.  CT resulted without acute aortic syndrome.  Multiple nonspecific findings noted, do think these can be followed as an 8 patient.  Patient reassessed and updated on results of workup.  She is comfortable with discharge and outpatient follow-up.  Do think it be reasonable to have her resume her amlodipine  10 mg, may need further blood pressure medications resumed.  Strict return precautions provided.  Patient discharged stable condition.      FINAL CLINICAL IMPRESSION(S) / ED DIAGNOSES   Final diagnoses:  Bilateral low back pain without sciatica, unspecified chronicity  Hypertensive urgency     Rx / DC Orders  ED Discharge Orders          Ordered    amLODipine  (NORVASC ) 10 MG tablet  Daily        01/24/24 0314             Note:  This document was prepared using Dragon voice recognition software and may include unintentional dictation errors.   Levander Slate, MD 01/24/24 [phone]

## 2024-01-24 NOTE — ED Notes (Signed)
 Provider continually made aware of pts BP prior to ED discharge. Reports she is okay with pt being transferred back to facility.

## 2024-01-24 NOTE — ED Notes (Signed)
 Report called and given to Adena, LPN at [facility].

## 2024-01-31 ENCOUNTER — Emergency Department

## 2024-01-31 ENCOUNTER — Other Ambulatory Visit: Payer: Self-pay

## 2024-01-31 ENCOUNTER — Observation Stay
Admission: EM | Admit: 2024-01-31 | Discharge: 2024-02-02 | Disposition: A | Discharge status: Skilled Nursing Facility | Attending: Internal Medicine | Admitting: Internal Medicine

## 2024-01-31 DIAGNOSIS — I161 Hypertensive emergency: Secondary | ICD-10-CM | POA: Diagnosis present

## 2024-01-31 DIAGNOSIS — K5730 Diverticulosis of large intestine without perforation or abscess without bleeding: Secondary | ICD-10-CM | POA: Insufficient documentation

## 2024-01-31 DIAGNOSIS — I69354 Hemiplegia and hemiparesis following cerebral infarction affecting left non-dominant side: Secondary | ICD-10-CM | POA: Diagnosis not present

## 2024-01-31 DIAGNOSIS — I69398 Other sequelae of cerebral infarction: Secondary | ICD-10-CM | POA: Diagnosis not present

## 2024-01-31 DIAGNOSIS — Z8673 Personal history of transient ischemic attack (TIA), and cerebral infarction without residual deficits: Secondary | ICD-10-CM | POA: Diagnosis not present

## 2024-01-31 DIAGNOSIS — Z6830 Body mass index (BMI) 30.0-30.9, adult: Secondary | ICD-10-CM | POA: Insufficient documentation

## 2024-01-31 DIAGNOSIS — R200 Anesthesia of skin: Secondary | ICD-10-CM

## 2024-01-31 DIAGNOSIS — F439 Reaction to severe stress, unspecified: Secondary | ICD-10-CM | POA: Diagnosis not present

## 2024-01-31 DIAGNOSIS — Z992 Dependence on renal dialysis: Secondary | ICD-10-CM | POA: Diagnosis not present

## 2024-01-31 DIAGNOSIS — I5032 Chronic diastolic (congestive) heart failure: Secondary | ICD-10-CM | POA: Insufficient documentation

## 2024-01-31 DIAGNOSIS — R748 Abnormal levels of other serum enzymes: Secondary | ICD-10-CM | POA: Insufficient documentation

## 2024-01-31 DIAGNOSIS — I10 Essential (primary) hypertension: Secondary | ICD-10-CM

## 2024-01-31 DIAGNOSIS — R29706 NIHSS score 6: Secondary | ICD-10-CM

## 2024-01-31 DIAGNOSIS — I132 Hypertensive heart and chronic kidney disease with heart failure and with stage 5 chronic kidney disease, or end stage renal disease: Secondary | ICD-10-CM | POA: Diagnosis not present

## 2024-01-31 DIAGNOSIS — R2681 Unsteadiness on feet: Secondary | ICD-10-CM | POA: Insufficient documentation

## 2024-01-31 DIAGNOSIS — Z87891 Personal history of nicotine dependence: Secondary | ICD-10-CM | POA: Diagnosis not present

## 2024-01-31 DIAGNOSIS — I160 Hypertensive urgency: Secondary | ICD-10-CM | POA: Diagnosis present

## 2024-01-31 DIAGNOSIS — Z79899 Other long term (current) drug therapy: Secondary | ICD-10-CM | POA: Diagnosis not present

## 2024-01-31 DIAGNOSIS — I639 Cerebral infarction, unspecified: Secondary | ICD-10-CM

## 2024-01-31 DIAGNOSIS — E669 Obesity, unspecified: Secondary | ICD-10-CM | POA: Diagnosis present

## 2024-01-31 DIAGNOSIS — M6281 Muscle weakness (generalized): Secondary | ICD-10-CM | POA: Insufficient documentation

## 2024-01-31 DIAGNOSIS — N186 End stage renal disease: Secondary | ICD-10-CM | POA: Diagnosis not present

## 2024-01-31 HISTORY — DX: Cerebral infarction, unspecified: I63.9

## 2024-01-31 LAB — DIFFERENTIAL
Abs Immature Granulocytes: 0.01 K/uL (ref 0.00–0.07)
Basophils Absolute: 0 K/uL (ref 0.0–0.1)
Basophils Relative: 1 %
Eosinophils Absolute: 0.1 K/uL (ref 0.0–0.5)
Eosinophils Relative: 3 %
Immature Granulocytes: 0 %
Lymphocytes Relative: 23 %
Lymphs Abs: 1.2 K/uL (ref 0.7–4.0)
Monocytes Absolute: 0.4 K/uL (ref 0.1–1.0)
Monocytes Relative: 9 %
Neutro Abs: 3.4 K/uL (ref 1.7–7.7)
Neutrophils Relative %: 64 %

## 2024-01-31 LAB — CBC
HCT: 40 % (ref 36.0–46.0)
Hemoglobin: 13.1 g/dL (ref 12.0–15.0)
MCH: 26.8 pg (ref 26.0–34.0)
MCHC: 32.8 g/dL (ref 30.0–36.0)
MCV: 81.8 fL (ref 80.0–100.0)
Platelets: 145 K/uL — ABNORMAL LOW (ref 150–400)
RBC: 4.89 MIL/uL (ref 3.87–5.11)
RDW: 15.9 % — ABNORMAL HIGH (ref 11.5–15.5)
WBC: 5.2 K/uL (ref 4.0–10.5)
nRBC: 0 % (ref 0.0–0.2)

## 2024-01-31 LAB — PROTIME-INR
INR: 1.1 (ref 0.8–1.2)
Prothrombin Time: 14.7 s (ref 11.4–15.2)

## 2024-01-31 LAB — COMPREHENSIVE METABOLIC PANEL WITH GFR
ALT: 12 U/L (ref 0–44)
AST: 18 U/L (ref 15–41)
Albumin: 3.6 g/dL (ref 3.5–5.0)
Alkaline Phosphatase: 351 U/L — ABNORMAL HIGH (ref 38–126)
Anion gap: 14 (ref 5–15)
BUN: 67 mg/dL — ABNORMAL HIGH (ref 6–20)
CO2: 26 mmol/L (ref 22–32)
Calcium: 8.5 mg/dL — ABNORMAL LOW (ref 8.9–10.3)
Chloride: 101 mmol/L (ref 98–111)
Creatinine, Ser: 7.61 mg/dL — ABNORMAL HIGH (ref 0.44–1.00)
GFR, Estimated: 6 mL/min — ABNORMAL LOW (ref >60)
Glucose, Bld: 97 mg/dL (ref 70–99)
Potassium: 4.1 mmol/L (ref 3.5–5.1)
Sodium: 141 mmol/L (ref 135–145)
Total Bilirubin: 0.7 mg/dL (ref 0.0–1.2)
Total Protein: 7.6 g/dL (ref 6.5–8.1)

## 2024-01-31 LAB — TROPONIN I (HIGH SENSITIVITY): Troponin I (High Sensitivity): 33 ng/L — ABNORMAL HIGH (ref <18)

## 2024-01-31 LAB — ETHANOL: Alcohol, Ethyl (B): <15 mg/dL (ref <15)

## 2024-01-31 LAB — GLUCOSE, CAPILLARY: Glucose-Capillary: 81 mg/dL (ref 70–99)

## 2024-01-31 LAB — MRSA NEXT GEN BY PCR, NASAL: MRSA by PCR Next Gen: DETECTED — AB

## 2024-01-31 LAB — APTT: aPTT: 55 s — ABNORMAL HIGH (ref 24–36)

## 2024-01-31 LAB — BRAIN NATRIURETIC PEPTIDE: B Natriuretic Peptide: 358.8 pg/mL — ABNORMAL HIGH (ref 0.0–100.0)

## 2024-01-31 MED ORDER — HEPARIN SODIUM (PORCINE) 5000 UNIT/ML IJ SOLN: 5000.0000 [IU] | Freq: Three times a day (TID) | INTRAMUSCULAR | Status: DC

## 2024-01-31 MED ORDER — METOPROLOL TARTRATE 50 MG PO TABS
50.0000 mg | ORAL_TABLET | Freq: Two times a day (BID) | ORAL | Status: DC
  Administered 2024-01-31 – 2024-02-01 (×3): 50 mg via ORAL
  Filled 2024-01-31 (×4): qty 1

## 2024-01-31 MED ORDER — CHLORHEXIDINE GLUCONATE CLOTH 2 % EX PADS
6.0000 | MEDICATED_PAD | Freq: Every day | CUTANEOUS | Status: DC
  Administered 2024-01-31 – 2024-02-02 (×3): 6 via TOPICAL
  Filled 2024-01-31: qty 6

## 2024-01-31 MED ORDER — IRBESARTAN 150 MG PO TABS
150.0000 mg | ORAL_TABLET | Freq: Every day | ORAL | Status: DC
  Administered 2024-01-31: 150 mg via ORAL
  Filled 2024-01-31: qty 1

## 2024-01-31 MED ORDER — MELATONIN 5 MG PO TABS
5.0000 mg | ORAL_TABLET | Freq: Every day | ORAL | Status: DC
  Administered 2024-01-31 – 2024-02-01 (×2): 5 mg via ORAL
  Filled 2024-01-31 (×2): qty 1

## 2024-01-31 MED ORDER — SEVELAMER CARBONATE 800 MG PO TABS
1600.0000 mg | ORAL_TABLET | Freq: Three times a day (TID) | ORAL | Status: DC
  Administered 2024-01-31 – 2024-02-02 (×7): 1600 mg via ORAL
  Filled 2024-01-31 (×7): qty 2

## 2024-01-31 MED ORDER — CLONIDINE HCL 0.3 MG/24HR TD PTWK
0.3000 mg | MEDICATED_PATCH | TRANSDERMAL | Status: DC
  Administered 2024-01-31: 0.3 mg via TRANSDERMAL
  Filled 2024-01-31: qty 1

## 2024-01-31 MED ORDER — PANTOPRAZOLE SODIUM 40 MG PO TBEC
40.0000 mg | DELAYED_RELEASE_TABLET | Freq: Every day | ORAL | Status: DC
  Administered 2024-01-31 – 2024-02-02 (×3): 40 mg via ORAL
  Filled 2024-01-31 (×3): qty 1

## 2024-01-31 MED ORDER — FLUTICASONE PROPIONATE 50 MCG/ACT NA SUSP: 1.0000 | Freq: Every day | NASAL | Status: DC | PRN

## 2024-01-31 MED ORDER — DOCUSATE SODIUM 100 MG PO CAPS
100.0000 mg | ORAL_CAPSULE | Freq: Two times a day (BID) | ORAL | Status: DC | PRN
  Administered 2024-02-01: 100 mg via ORAL
  Filled 2024-01-31: qty 1

## 2024-01-31 MED ORDER — ATORVASTATIN CALCIUM 20 MG PO TABS
40.0000 mg | ORAL_TABLET | Freq: Every day | ORAL | Status: DC
  Administered 2024-01-31 – 2024-02-02 (×3): 40 mg via ORAL
  Filled 2024-01-31 (×3): qty 2

## 2024-01-31 MED ORDER — HYDRALAZINE HCL 20 MG/ML IJ SOLN
20.0000 mg | Freq: Once | INTRAMUSCULAR | Status: AC
  Administered 2024-01-31: 20 mg via INTRAVENOUS
  Filled 2024-01-31: qty 1

## 2024-01-31 MED ORDER — NICARDIPINE HCL IN NACL 20-0.86 MG/200ML-% IV SOLN
3.0000 mg/h | INTRAVENOUS | Status: DC
  Filled 2024-01-31: qty 200

## 2024-01-31 MED ORDER — MUPIROCIN 2 % EX OINT
1.0000 | TOPICAL_OINTMENT | Freq: Two times a day (BID) | CUTANEOUS | Status: DC
  Administered 2024-01-31 – 2024-02-02 (×4): 1 via NASAL
  Filled 2024-01-31: qty 22

## 2024-01-31 MED ORDER — GABAPENTIN 100 MG PO CAPS
200.0000 mg | ORAL_CAPSULE | Freq: Three times a day (TID) | ORAL | Status: DC
  Administered 2024-01-31 – 2024-02-02 (×7): 200 mg via ORAL
  Filled 2024-01-31 (×7): qty 2

## 2024-01-31 MED ORDER — ISOSORBIDE MONONITRATE ER 30 MG PO TB24
120.0000 mg | ORAL_TABLET | Freq: Every evening | ORAL | Status: DC
  Administered 2024-01-31 – 2024-02-02 (×3): 120 mg via ORAL
  Filled 2024-01-31 (×5): qty 4

## 2024-01-31 MED ORDER — ONDANSETRON HCL 4 MG/2ML IJ SOLN
4.0000 mg | Freq: Four times a day (QID) | INTRAMUSCULAR | Status: DC | PRN
Start: 2024-01-31 — End: 2024-02-02

## 2024-01-31 MED ORDER — IOHEXOL 350 MG/ML SOLN
100.0000 mL | Freq: Once | INTRAVENOUS | Status: AC | PRN
  Administered 2024-01-31: 100 mL via INTRAVENOUS

## 2024-01-31 MED ORDER — DICLOFENAC SODIUM 1 % EX GEL
2.0000 g | Freq: Two times a day (BID) | CUTANEOUS | Status: DC
  Administered 2024-01-31 – 2024-02-02 (×5): 2 g via TOPICAL
  Filled 2024-01-31: qty 100

## 2024-01-31 MED ORDER — SODIUM CHLORIDE 0.9% FLUSH
3.0000 mL | Freq: Once | INTRAVENOUS | Status: AC
  Administered 2024-01-31: 3 mL via INTRAVENOUS

## 2024-01-31 MED ORDER — AMLODIPINE BESYLATE 10 MG PO TABS
10.0000 mg | ORAL_TABLET | Freq: Every day | ORAL | Status: DC
  Administered 2024-01-31 – 2024-02-02 (×3): 10 mg via ORAL
  Filled 2024-01-31 (×4): qty 1

## 2024-01-31 MED ORDER — POLYETHYLENE GLYCOL 3350 17 G PO PACK: 17.0000 g | PACK | Freq: Every day | ORAL | Status: DC | PRN

## 2024-01-31 NOTE — ED Notes (Signed)
 Per EDP, pt speech sounds slightly dysarthric. Pt agrees with EDP.

## 2024-01-31 NOTE — ED Notes (Signed)
 Stroke cart activated at 1109.

## 2024-01-31 NOTE — H&P (Signed)
 NAME:  Allison Hill, MRN:  [id number], DOB:  1970/12/08, LOS: 0 ADMISSION DATE:  01/31/2024, CONSULTATION DATE: 01/31/2024 REFERRING MD: Dr. Dicky, CHIEF COMPLAINT: HTN  History of Present Illness:  This is a 53 yo female who presented to [facility] ER on 08/31 via EMS from [facility] with hypertension and a severe headache.  Per ER notes it was reported pts bp 190/112, the facility administered clonidine   and following administration bp decreased to 185/121.  She does have ESRD on hemodialysis (M-Tues-Wed-Fri which are shorter sessions).  She reports she has not missed any HD sessions her last session was on 08/29.  She reports she does not think she has been receiving her antihypertensive medications as prescribed at the facility.    ED Course Upon arrival to the ER pt noted to have a little slurred speech; LLE weakness worse than normal; and left foot numbness.   She does have have a stroke hx with residual left-sided deficits.  Code stroke activated.  CT Head negative for acute intracranial abnormality but ASPECTS 10.  CTA Head/Neck revealed no evidence of significant stenosis, aneurysmal dilatation, or dissection involving the arteries of the head and neck.  Pt deemed not a candidate for TNK due to hx of hemorrhagic infarcts in the thalami, therefore risk outweighed the benefits.  Pts bp remained elevated 200's/100's.  Nephrology and Neurology recommended iv antihypertensive therapy, therefore nicardipine  gtt ordered and goal sbp 170-180.  PCCM team contacted for ICU admission.   Significant ED lab results: BUN 67/creatinine 7.61/calcium  8.5/alk phos 351/platelet count 145.  CTA Head/Neck: No acute intracranial hemorrhage or ischemic change. No evidence of significant stenosis, aneurysmal dilatation, or dissection involving the arteries of the head and neck. Mild tortuosity of the cervical right ICA and left common carotid artery and mid cervical ICA without significant stenosis. CT Head: No  acute intracranial abnormality. Aspects 10/10. Remote lacunar infarcts in the basal ganglia and thalami bilaterally, right greater than left. Moderate atrophy and white matter changes, stable.  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  If pt remains off nicardipine  gtt will transfer to [facility] to assume care 09/1  Interim History / Subjective:  Pts bp remains 200's/100's nicardipine  gtt not started yet.  She received 20 mg iv hydralazine  x1 dose post administration bp 161/101  Objective    Blood pressure (!) 220/133, pulse 66, temperature 97.8 F (36.6 C), temperature source Oral, resp. rate 20, SpO2 100%.       No intake or output data in the 24 hours ending 01/31/24 1239 There were no vitals filed for this visit.  Examination: General: Acute on chronically-ill appearing female, NAD on RA  HENT: Supple, no JVD  Lungs: Clear throughout, even, non labored  Cardiovascular: NSR, s1s2, no r/g, 2+ radial/1+ distal pulses, no edema  Abdomen: +BS x4, obese, soft, non tender  Extremities: Moves all extremities Neuro: Left-sided hemiparesis at baseline, mild dysarthria, alert and oriented, follows commands GU: Anuric   Resolved problem list   Assessment and Plan   #Stroke-like symptoms  Hx: Stroke with residual left-sided hemiparesis  CT Head 01/31/24: negative for acute intracranial abnormality but ASPECTS 10 - Neurology consulted appreciate input: pt deemed not a  candidate for TNK - Stat CT Head or MRI Brain for acute neurological changes  - PT/OT once bp stabilized  #Hypertensive Emergency  #HFpEF  - Continuous telemetry monitoring  -  Resume outpatient antihypertensives - Goal sbp 170-180 for first 24hrs  - Prn nicardipine  to maintain sbp goal  - Continue outpatient atorvastatin   - Troponin: 33   #ESRD on HD - Trend BMP  - Replace electrolytes as indicated  - Strict I&O's - Nephrology consulted appreciate input: HD per recommendations    #Chronically elevated alk phos - Monitor hepatic function panel   #Endo - Follow hyper/hypoglycemic protocol  - Target CBG readings 140 to 180 while in ICU   Labs   CBC: Recent Labs  Lab 01/31/24 1101  WBC 5.2  NEUTROABS 3.4  HGB 13.1  HCT 40.0  MCV 81.8  PLT 145*    Basic Metabolic Panel: Recent Labs  Lab 01/31/24 1101  NA 141  K 4.1  CL 101  CO2 26  GLUCOSE 97  BUN 67*  CREATININE 7.61*  CALCIUM  8.5*   GFR: Estimated Creatinine Clearance: 8.7 mL/min (A) (by C-G formula based on SCr of 7.61 mg/dL (H)). Recent Labs  Lab 01/31/24 1101  WBC 5.2    Liver Function Tests: Recent Labs  Lab 01/31/24 1101  AST 18  ALT 12  ALKPHOS 351*  BILITOT 0.7  PROT 7.6  ALBUMIN 3.6   No results for input(s): LIPASE, AMYLASE in the last 168 hours. No results for input(s): AMMONIA in the last 168 hours.  ABG No results found for: PHART, PCO2ART, PO2ART, HCO3, TCO2, ACIDBASEDEF, O2SAT   Coagulation Profile: Recent Labs  Lab 01/31/24 1101  INR 1.1    Cardiac Enzymes: No results for input(s): CKTOTAL, CKMB, CKMBINDEX, TROPONINI in the last 168 hours.  HbA1C: Hgb A1c MFr Bld  Date/Time Value Ref Range Status  09/10/2023 09:35 AM 4.4 (L) 4.8 - 5.6 % Final    Comment:    (NOTE)         Prediabetes: 5.7 - 6.4         Diabetes: >6.4         Glycemic control for adults with diabetes: <7.0     CBG: No results for input(s): GLUCAP in the last 168 hours.  Review of Systems:   Gen: Denies fever, chills, weight change, fatigue, night sweats HEENT: Denies blurred vision, double vision, hearing loss, tinnitus, sinus  congestion, rhinorrhea, sore throat, neck stiffness, dysphagia PULM: Denies shortness of breath, cough, sputum production, hemoptysis, wheezing CV: Denies chest pain, edema, orthopnea, paroxysmal nocturnal dyspnea, palpitations GI: Denies abdominal pain, nausea, vomiting, diarrhea, hematochezia, melena, constipation, change in bowel habits GU: Denies dysuria, hematuria, polyuria, oliguria, urethral discharge Endocrine: Denies hot or cold intolerance, polyuria, polyphagia or appetite change Derm: Denies rash, dry skin, scaling or peeling skin change Heme: Denies easy bruising, bleeding, bleeding gums Neuro: headache, left foot numbness, left-sided hemiparesis, slurred speech, loss of memory or consciousness  Past Medical History:  She,  has a past medical history of CHF (congestive heart failure) (HCC), Hypertension, Renal disorder, and Stroke (HCC).   Surgical History:  History reviewed. No pertinent surgical history.   Social History:   reports that she has quit smoking. Her smoking use included cigarettes. She has never used smokeless tobacco. She reports that she does not currently use alcohol. She reports current drug use. Drug: Codeine.   Family History:  Her family history includes CAD in her brother; Heart failure in her mother.  Allergies Allergies  Allergen Reactions   Ceftriaxone Shortness Of Breath and Hives    Allergy, hives   Shellfish Allergy Hives   Minoxidil Swelling     Home Medications  Prior to Admission medications   Medication Sig Start Date End Date Taking? Authorizing Provider  acetaminophen  (TYLENOL ) 500 MG tablet Take 500 mg by mouth 3 (three) times daily.   Yes [provider]  amLODipine  (NORVASC ) 10 MG tablet Take 1 tablet (10 mg total) by mouth daily. 01/24/24 03/24/24 Yes Ray, Nilsa, MD  atorvastatin  (LIPITOR) 40 MG tablet Take 40 mg by mouth daily. 07/05/23  Yes [provider]  calcitRIOL  (ROCALTROL ) 0.25 MCG capsule Take 1 capsule  (0.25 mcg total) by mouth 4 (four) times a week. Take one capsule by mouth every Mon, Tue, Wed, Fri with dialysis Patient taking differently: Take 0.75 mcg by mouth every Monday, Tuesday, Wednesday, Thursday, and Friday. Administered at dialysis every Mon, Tue, Wed, Thurs and Fri. 12/10/23  Yes Dezii, Alexandra, DO  calcium  carbonate (TUMS - DOSED IN MG ELEMENTAL CALCIUM ) 500 MG chewable tablet Chew 2 tablets (400 mg of elemental calcium  total) by mouth 2 (two) times daily. 09/23/23  Yes Awanda City, MD  cinacalcet  (SENSIPAR ) 60 MG tablet Take 60 mg by mouth daily with supper. 11/30/23  Yes [provider]  cloNIDine  (CATAPRES  - DOSED IN MG/24 HR) 0.3 mg/24hr patch Place 1 patch onto the skin once a week. On Fridays   Yes [provider]  cloNIDine  (CATAPRES ) 0.1 MG tablet Take 0.1 mg by mouth 2 (two) times daily as needed (Diastolic BP greater than 100 with manual BP). 10/06/23  Yes [provider]  diclofenac  Sodium (VOLTAREN ) 1 % GEL Apply 2 g topically 2 (two) times daily. Apply to left shoulder.   Yes [provider]  fluticasone  (FLONASE ) 50 MCG/ACT nasal spray Place 1 spray into both nostrils daily as needed for allergies or rhinitis. 09/23/23  Yes Awanda City, MD  gabapentin  (NEURONTIN ) 100 MG capsule Take 200 mg by mouth 3 (three) times daily.   Yes [provider]  isosorbide  mononitrate (IMDUR ) 120 MG 24 hr tablet Take 120 mg by mouth every evening.   Yes [provider]  lidocaine  4 % Place 1 patch onto the skin in the morning. Remove & Discard patch within 12 hours or as directed by MD   Yes [provider]  melatonin 3 MG TABS tablet Take 6 mg by mouth at bedtime. 12/26/22  Yes [provider]  metoprolol  tartrate (LOPRESSOR ) 50 MG tablet Take 50 mg by mouth 2 (two) times daily. 11/12/23  Yes [provider]  pantoprazole  (PROTONIX ) 40 MG tablet Take 1 tablet (40 mg total) by mouth daily. 09/24/23  Yes Awanda City, MD   polyethylene glycol (MIRALAX  / GLYCOLAX ) 17 g packet Take 17 g by mouth daily as needed. 09/23/23  Yes Awanda City, MD  senna-docusate (SENOKOT-S) 8.6-50 MG tablet Take 2 tablets by mouth 2 (two) times daily as needed for mild constipation. 09/23/23  Yes Awanda City, MD  sevelamer  carbonate (RENVELA ) 800 MG tablet Take 1,600 mg by mouth 3 (three) times daily.   Yes [provider]  valsartan (DIOVAN) 160 MG tablet Take 160 mg by mouth daily. 01/24/24  Yes [provider]  epoetin  alfa (EPOGEN ) 4000 UNIT/ML injection Inject 1 mL (4,000 Units total) into the vein Every Tuesday,Thursday,and Saturday with dialysis. Patient not taking: Reported on 01/22/2024 09/24/23   Awanda City, MD  nitroGLYCERIN  (NITROSTAT ) 0.4  MG SL tablet Place 0.4 mg under the tongue every 5 (five) minutes as needed for chest pain. Patient not taking: Reported on 01/31/2024 05/29/23   [provider]  Salicylic Acid (COMPOUND W ONE STEP) 40 % PADS Apply topically to right thumb every other day. Patient not taking: Reported on 01/31/2024 01/07/24   [provider]     Critical care time: 60 minutes     Lonell Moose, AGNP  Pulmonary/Critical Care Pager [phone] (please enter 7 digits) PCCM Consult Pager [phone] (please enter 7 digits)

## 2024-01-31 NOTE — Consult Note (Signed)
 NEUROLOGY CONSULT NOTE   Date of service: January 31, 2024 Patient Name: Allison Hill MRN:  [id number] DOB:  Sep 09, 1970 Chief Complaint: Severe headache with worsened LLE weakness and slurred speech relative to her chronic post-stroke baseline Requesting Provider: Dicky Anes, MD  History of Present Illness  Allison Hill is a 53 y.o. female with a PMHx of stroke in 2015 with left sided deficits, a second stroke in 2023 which also manifested with left sided deficits, severe HTN, CHF and ESRD on HD, who presents with acute onset of severe headache with worsened LLE weakness and slurred speech relative to her chronic post-stroke baseline  She last felt normal at 8 PM when she went to sleep. She woke up with the headache between 6 and 7 AM and also felt vertiginous at that time. When she tried to get out of bed at about 8 AM, she noticed that her left leg felt weaker than normal. Staff at the facility took her BP, which was 180's/120's. She took her hydralazine  for the headache, but did not take any of her other AM BP meds. She was brought to the ED by EMS and a Code Stroke was called on arrival.   Her headache was initially throbbing and is now stabbing and rated as 10/10.   She states that whenever her BP becomes severely elevated, she experiences worsening of her pre-existing stroke deficits.   She states that her speech is always a little slurred. She currently has visual blurring in her entire left eye and states that she has a chronic blood vessel problem in that eye. She denies any worsened LUE weakness but states that her LLE is weaker than normal and that her left foot is numb.   She was recently seen on 8/21 by Teleneurology when she was admitted for similar symptoms. CTA of head and neck showed no LVO and MRI of the brain was negative for acute process.   LKW: 8:00 PM Modified rankin score: 3-Moderate disability-requires help but walks WITHOUT assistance IV Thrombolysis: No:  History of ICH and out of the time window.  EVT: No: CTA is negative for LVO   NIHSS components Score: Comment  1a Level of Conscious 0[x]  1[]  2[]  3[]      1b LOC Questions 0[x]  1[]  2[]       1c LOC Commands 0[x]  1[]  2[]       2 Best Gaze 0[x]  1[]  2[]      Saccadic visual pursuits noted  3 Visual 0[x]  1[]  2[]  3[]      4 Facial Palsy 0[]  1[]  2[x]  3[]      5a Motor Arm - left 0[x]  1[]  2[]  3[]  4[]  UN[]   No drift antigravity, but with spastic paresis with 4/5 strength  5b Motor Arm - Right 0[x]  1[]  2[]  3[]  4[]  UN[]    6a Motor Leg - Left 0[]  1[x]  2[]  3[]  4[]  UN[]   Bobbing drift, spastic paresis with 4/5 strength  6b Motor Leg - Right 0[x]  1[]  2[]  3[]  4[]  UN[]    7 Limb Ataxia 0[]  1[x]  2[]  UN[]     Mild LLE ataxia. Left FNF slow but with no ataxia   8 Sensory 0[]  1[x]  2[]  UN[]     Decreased LLE temp sensation. States sensation feels equal to face and arms.   9 Best Language 0[x]  1[]  2[]  3[]      10 Dysarthria 0[]  1[x]  2[]  UN[]      11 Extinct. and Inattention 0[x]  1[]  2[]       TOTAL:   6      ROS  Comprehensive ROS performed and pertinent positives documented in HPI    Past History   Past Medical History:  Diagnosis Date   CHF (congestive heart failure) (HCC)    Hypertension    Renal disorder    ESRD on dialysis   Stroke [facility])    L hemiparesis, 2015, 2023    History reviewed. No pertinent surgical history.  Family History: Family History  Problem Relation Age of Onset   Heart failure Mother    CAD Brother     Social History  reports that she has quit smoking. Her smoking use included cigarettes. She has never used smokeless tobacco. She reports that she does not currently use alcohol. She reports current drug use. Drug: Codeine.  Allergies  Allergen Reactions   Ceftriaxone Shortness Of Breath and Hives    Allergy, hives   Shellfish Allergy Hives   Minoxidil Swelling    Medications   Current Facility-Administered Medications:    sodium chloride  flush (NS) 0.9 % injection 3  mL, 3 mL, Intravenous, Once, Quale, Mark, MD  Current Outpatient Medications:    acetaminophen  (TYLENOL ) 500 MG tablet, Take 500 mg by mouth 3 (three) times daily., Disp: , Rfl:    amLODipine  (NORVASC ) 10 MG tablet, Take 1 tablet (10 mg total) by mouth daily., Disp: 30 tablet, Rfl: 14   atorvastatin  (LIPITOR) 40 MG tablet, Take 40 mg by mouth daily., Disp: , Rfl:    calcitRIOL  (ROCALTROL ) 0.25 MCG capsule, Take 1 capsule (0.25 mcg total) by mouth 4 (four) times a week. Take one capsule by mouth every Mon, Tue, Wed, Fri with dialysis (Patient taking differently: Take 0.75 mcg by mouth 4 (four) times a week. Administered at dialysis every Mon, Tue, Wed, Fri.), Disp: , Rfl:    calcium  carbonate (TUMS - DOSED IN MG ELEMENTAL CALCIUM ) 500 MG chewable tablet, Chew 2 tablets (400 mg of elemental calcium  total) by mouth 2 (two) times daily., Disp: , Rfl:    cinacalcet  (SENSIPAR ) 60 MG tablet, Take 60 mg by mouth daily with supper., Disp: , Rfl:    cloNIDine  (CATAPRES  - DOSED IN MG/24 HR) 0.3 mg/24hr patch, Place 1 patch onto the skin once a week. On Fridays, Disp: , Rfl:    cloNIDine  (CATAPRES ) 0.1 MG tablet, Take 0.1 mg by mouth 2 (two) times daily as needed (Diastolic BP greater than 100 with manual BP)., Disp: , Rfl:    diclofenac  Sodium (VOLTAREN ) 1 % GEL, Apply 2 g topically 2 (two) times daily. Apply to left shoulder., Disp: , Rfl:    epoetin  alfa (EPOGEN ) 4000 UNIT/ML injection, Inject 1 mL (4,000 Units total) into the vein Every Tuesday,Thursday,and Saturday with dialysis. (Patient not taking: Reported on 01/22/2024), Disp: , Rfl:    fluticasone  (FLONASE ) 50 MCG/ACT nasal spray, Place 1 spray into both nostrils daily as needed for allergies or rhinitis., Disp: , Rfl:    gabapentin  (NEURONTIN ) 100 MG capsule, Take 200 mg by mouth 3 (three) times daily., Disp: , Rfl:    isosorbide  mononitrate (IMDUR ) 120 MG 24 hr tablet, Take 120 mg by mouth daily., Disp: , Rfl:    melatonin 3 MG TABS tablet, Take 6 mg  by mouth at bedtime., Disp: , Rfl:    metoprolol  tartrate (LOPRESSOR ) 50 MG tablet, Take 50 mg by mouth 2 (two) times daily., Disp: , Rfl:    nitroGLYCERIN  (NITROSTAT ) 0.4 MG SL tablet, Place 0.4 mg under the tongue every 5 (five) minutes as needed for chest pain., Disp: , Rfl:  pantoprazole  (PROTONIX ) 40 MG tablet, Take 1 tablet (40 mg total) by mouth daily., Disp: , Rfl:    polyethylene glycol (MIRALAX  / GLYCOLAX ) 17 g packet, Take 17 g by mouth daily as needed., Disp: , Rfl:    Salicylic Acid (COMPOUND W ONE STEP) 40 % PADS, Apply topically to right thumb every other day., Disp: , Rfl:    senna-docusate (SENOKOT-S) 8.6-50 MG tablet, Take 2 tablets by mouth 2 (two) times daily as needed for mild constipation., Disp: , Rfl:    sevelamer  carbonate (RENVELA ) 800 MG tablet, Take 1,600 mg by mouth 3 (three) times daily., Disp: , Rfl:   Vitals   Vitals:   01/31/24 1059  BP: (!) 206/125  Pulse: 66  Resp: 20  Temp: 97.8 F (36.6 C)  TempSrc: Oral  SpO2: 100%    There is no height or weight on file to calculate BMI.   Physical Exam   Constitutional: Appears well-developed and well-nourished.  Eyes: No scleral injection.  HENT: No OP obstruction.  Head: Normocephalic.  Respiratory: Effort normal, non-labored breathing.   Neurologic Examination   See NIHSS  Labs/Imaging/Neurodiagnostic studies   CBC: No results for input(s): WBC, NEUTROABS, HGB, HCT, MCV, PLT in the last 168 hours. Basic Metabolic Panel:  Lab Results  Component Value Date   NA 139 01/24/2024   K 5.0 01/24/2024   CO2 24 01/24/2024   GLUCOSE 106 (H) 01/24/2024   BUN 90 (H) 01/24/2024   CREATININE 8.03 (H) 01/24/2024   CALCIUM  8.0 (L) 01/24/2024   GFRNONAA 6 (L) 01/24/2024   GFRAA  05/17/2007    >60        The eGFR has been calculated using the MDRD equation. This calculation has not been validated in all clinical   Lipid Panel:  Lab Results  Component Value Date   LDLCALC 48 12/19/2023    HgbA1c:  Lab Results  Component Value Date   HGBA1C 4.4 (L) 09/10/2023   Urine Drug Screen:     Component Value Date/Time   LABOPIA NONE DETECTED 01/21/2024 2226   COCAINSCRNUR NONE DETECTED 01/21/2024 2226   LABBENZ NONE DETECTED 01/21/2024 2226   AMPHETMU NONE DETECTED 01/21/2024 2226   THCU NONE DETECTED 01/21/2024 2226   LABBARB NONE DETECTED 01/21/2024 2226    Alcohol Level     Component Value Date/Time   [facility] <15 01/21/2024 2155   INR  Lab Results  Component Value Date   INR 1.0 01/21/2024   APTT  Lab Results  Component Value Date   APTT 28 01/21/2024     ASSESSMENT  Allison Hill is a 53 y.o. female with a PMHx of stroke in 2015 with left sided deficits, a second stroke in 2023 which also manifested with left sided deficits, severe HTN, CHF and ESRD on HD, who presents with acute onset of severe headache with worsened LLE weakness and slurred speech relative to her chronic post-stroke baseline. - Exam reveals left sided sensory and motor deficits with mild LLE ataxia. NIHSS 6.  - CT head: No acute intracranial abnormality. Aspects 10/10. Remote lacunar infarcts in the basal ganglia and thalami bilaterally, right greater than left. Moderate atrophy and white matter changes, stable. - CTA of head and neck: No acute intracranial hemorrhage or ischemic change. No evidence of significant stenosis, aneurysmal dilatation, or dissection involving the arteries of the head and neck. Mild tortuosity of the cervical right ICA and left common carotid artery and mid cervical ICA without significant stenosis. - Impression:  -  Acute onset of worsened left sided numbness and weakness relative to her chronic post-stroke baseline.  - She has presented to the hospital in the past on several occasions for recurrent left sided weakness and/or dysarthria, some associated with headache. Some of the visits have been associated with severe HTN and on others she was normotensive.  -  Numerous/extensive chronic microhemorrhages are seen on her prior scans dating back to the earliest available from April of this year. Comparison between her April and August 22nd scans shows no change in the distribution of the chronic hemorrhages, suggesting that these may not represent a progressive condition at this time. 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  - DDx for her presentation includes psychogenic pseudostroke, secondary gain, recrudescence of previous stroke symptoms in the setting of stress, and malignant HTN. Acute stroke is felt to be significantly lower on the DDx relative to malignant HTN.    RECOMMENDATIONS  - Consult Nephrology for dialysis this admission to remove contrast that was needed for her CTA (normally has dialysis MWF - of note, this coming Monday is a national holiday) - Hold off on ASA. She has multiple chronic microhemorrhages that are scattered supratentorially and infratentorially throughout her brain on her prior MRI scans.  - Aggressive BP management.  - MRI brain WITHOUT contrast - Will need outpatient Neurology follow up.  ______________________________________________________________________    Bonney SHARK, Brookes Craine, MD [facility]

## 2024-01-31 NOTE — ED Notes (Signed)
 Per EDP, hold Cardene  for now. Give hydralazine  now.

## 2024-01-31 NOTE — ED Notes (Signed)
 Code Stoke called on patient at 11:05 to care link per Doctor Quale.

## 2024-01-31 NOTE — Progress Notes (Signed)
   01/31/24 1126  Spiritual Encounters  Type of Visit Initial  Care provided to: Pt not available  Referral source Code page  Reason for visit Code  OnCall Visit Yes   Chaplain responded to Code Stroke. Patient unavailable. No family present. Chaplain services available if needed.

## 2024-01-31 NOTE — ED Notes (Signed)
 It ticket placed for phillips monitor and scanner. Allison Hill will not take BP. Scanner is broken.

## 2024-01-31 NOTE — ED Triage Notes (Signed)
 To ED [facility] from [facility] for HTN and HA since this morning around 8am. Was 190/122 at 0850, given Clonidine  at facility and BP came down 185/121. Last BP by EMS was 186/123. Pablo Tue Wed Friday dialysis (shorter sessions) done at [facility]. Has not missed any.   EMS VS: 100% RA, CBG 94, 97.7, HR 72, NSR, 186/123  Hx L paresis from prior stroke, ESRD, CHF. No IV access, usually needs u/s IV. Neither arm is restricted, has dialysis port to R chest.   EDP at bedside

## 2024-01-31 NOTE — Plan of Care (Signed)
 Continuing with plan of care.

## 2024-01-31 NOTE — ED Provider Notes (Signed)
 [facility] Provider Note    Event Date/Time   First MD Initiated Contact with Patient 01/31/24 1057     (approximate)   History   Headache and Hypertension   HPI  Allison Hill is a 53 y.o. female  ESRD-HD (MTWF), stroke with left-sided weakness, HTN, dCHF, chronic, obesity, migraine, cocaine abuse, alcohol abuse in remission, former smoker  reports this morning she felt okay on initially.  Around 830 she started developing fairly severe headache and reports that headache is starting to improve now on its own.  At her care facility they noticed her blood pressure was high.  She took clonidine .  She does not believe she took her other regular medications but was given at least 1 medicine for blood pressure.  She also noticed that feels like maybe her speech just felt a little and continues to feel a little bit stuttered from her normal.  She has chronic weakness in her left arm and left leg and walks with a walker from an old stroke.  She has not had any facial droop.  She has not noticed any new weakness or numbness on the body and is using her right arm and right leg as she typically would.  She denies use of any blood thinners but does take aspirin      Physical Exam   Triage Vital Signs: ED Triage Vitals  Encounter Vitals Group     BP 01/31/24 1059 (!) 206/125     Girls Systolic BP Percentile --      Girls Diastolic BP Percentile --      Boys Systolic BP Percentile --      Boys Diastolic BP Percentile --      Pulse Rate 01/31/24 1059 66     Resp 01/31/24 1059 20     Temp 01/31/24 1059 97.8 F (36.6 C)     Temp Source 01/31/24 1059 Oral     SpO2 01/31/24 1059 100 %     Weight --      Height --      Head Circumference --      Peak Flow --      Pain Score 01/31/24 1057 8     Pain Loc --      Pain Education --      Exclude from Growth Chart --     Most recent vital signs: Vitals:   01/31/24 1059  BP: (!) 206/125  Pulse: 66  Resp:  20  Temp: 97.8 F (36.6 C)  SpO2: 100%     General: Awake, no distress.  She is alert, oriented.  Very mild dysarthria.  Slight weakness left arm left leg when compared to the right but shows excellent strength 5 out of 5 on the right.  Reports mild weakness on the left is not new.  Reports normal sensation over arms legs face bilateral.  Extraocular movements are normal.  Denies loss of vision but reports she had a sort of blurring feeling with vision earlier that seems to have improved.  CV:  Good peripheral perfusion.  Normal tones and rate Resp:  Normal effort.  Clear bilateral Abd:  No distention.  Other:  No edema   ED Results / Procedures / Treatments   Labs (all labs ordered are listed, but only abnormal results are displayed) Labs Reviewed  APTT - Abnormal; Notable for the following components:      Result Value   aPTT 55 (*)    All other components within  normal limits  CBC - Abnormal; Notable for the following components:   RDW 15.9 (*)    Platelets 145 (*)    All other components within normal limits  COMPREHENSIVE METABOLIC PANEL WITH GFR - Abnormal; Notable for the following components:   BUN 67 (*)    Creatinine, Ser 7.61 (*)    Calcium  8.5 (*)    Alkaline Phosphatase 351 (*)    GFR, Estimated 6 (*)    All other components within normal limits  PROTIME-INR  DIFFERENTIAL  ETHANOL  CBG MONITORING, ED     EKG  Interpreted by me at 1150 heart rate 70 QRS 90 QTc 540 Somewhat difficult to ascertain, but P waves visible in aVF.  Appears normal sinus rhythm with left ventricular hypertrophy   RADIOLOGY  CT ANGIO HEAD NECK W WO CM W PERF (CODE STROKE) Result Date: 01/31/2024 EXAM: CTA Head and Neck without and with Intravenous Contrast. CT Head without Contrast. CLINICAL HISTORY: Neuro deficit, acute, stroke suspected. Code stroke. Neuro MD: Camellia Shark [phone]; Pt getting dialysis tomorrow. TECHNIQUE: Axial CTA images of the head and neck performed  with intravenous contrast. MIP reconstructed images were created and reviewed. Axial computed tomography images of the head/brain performed without intravenous contrast. Note: Per PQRS, the description of internal carotid artery percent stenosis, including 0 percent or normal exam, is based on [place] American Symptomatic Carotid Endarterectomy Trial (NASCET) criteria. Dose reduction technique was used including one or more of the following: automated exposure control, adjustment of mA and kV according to patient size, and/or iterative reconstruction. CONTRAST: Without and with; 100 mL iohexol  (Omnipaque ) 350 mg/mL injection. COMPARISON: CT of the head without contrast 01/31/2024. CT angio head and neck 01/21/2024. FINDINGS: CT HEAD: BRAIN: No acute intraparenchymal hemorrhage. No mass lesion. No CT evidence for acute territorial infarct. No midline shift or extra-axial collection. VENTRICLES: No hydrocephalus. ORBITS: The orbits are unremarkable. SINUSES AND MASTOIDS: The paranasal sinuses and mastoid air cells are clear. CTA NECK: COMMON CAROTID ARTERIES: No significant stenosis. No dissection or occlusion. Atherosclerotic calcifications are present at the aortic arch and proximal left subclavian artery without significant stenoses or aneurysm. No dissection is present. INTERNAL CAROTID ARTERIES: No stenosis by NASCET criteria. No dissection or occlusion. Minimal calcification is present at the right carotid bifurcation without significant stenosis. Mild tortuosity of the cervical right ICA is again noted. Tortuosity is present in the left common carotid artery and mid cervical ICA without significant stenosis. VERTEBRAL ARTERIES: No significant stenosis. No dissection or occlusion. CTA HEAD: ANTERIOR CEREBRAL ARTERIES: No significant stenosis. No occlusion. No aneurysm. MIDDLE CEREBRAL ARTERIES: No significant stenosis. No occlusion. No aneurysm. POSTERIOR CEREBRAL ARTERIES: No significant stenosis. No occlusion.  No aneurysm. BASILAR ARTERY: No significant stenosis. No occlusion. No aneurysm. OTHER: Atherosclerotic calcifications are again noted within the cavernous internal carotid arteries bilaterally without significant stenoses through the ICA terminus. SOFT TISSUES: No acute finding. No masses or lymphadenopathy. A right IJ catheter is stable. BONES: No acute osseous abnormality. IMPRESSION: 1. No acute intracranial hemorrhage or ischemic change. 2. No evidence of significant stenosis, aneurysmal dilatation, or dissection involving the arteries of the head and neck. 3. Mild tortuosity of the cervical right ICA and left common carotid artery and mid cervical ICA without significant stenosis. THE PERTINENT RESULTS WERE TEXTED TO DR. LINDZEN VIA THE AMION SYSTEM AT 11:54 AM. Electronically signed by: Lonni Necessary MD 01/31/2024 11:59 AM EDT RP Workstation: [id number]   CT HEAD CODE STROKE WO CONTRAST` Result Date: 01/31/2024 EXAM:  CT HEAD WITHOUT 01/31/2024 11:34:02 AM TECHNIQUE: CT of the head was performed without the administration of intravenous contrast. Automated exposure control, iterative reconstruction, and/or weight based adjustment of the mA/kV was utilized to reduce the radiation dose to as low as reasonably achievable. COMPARISON: MR head without contrast 01/22/2024. CT head and CT angio head and neck 01/21/2024. CLINICAL HISTORY: Headache, sudden, severe. FINDINGS: BRAIN AND VENTRICLES: No acute intracranial hemorrhage. No mass effect or midline shift. No extra-axial fluid collection. No evidence of acute infarct. No hydrocephalus. Remote lacunar infarcts are again noted in the basal ganglia and thalami bilaterally, right greater than left. Moderate atrophy and white matter changes are stable. [place] stroke program early CT (ASPECT) score ----- Ganglionic (caudate, IC, lentiform nucleus, insula, M1-M3): 7 Supraganglionic (M4-M6): 3 Total: 10 ORBITS: No acute abnormality. SINUSES AND MASTOIDS: No  acute abnormality. SOFT TISSUES AND SKULL: No acute skull fracture. No acute soft tissue abnormality. IMPRESSION: 1. No acute intracranial abnormality. Aspects 10/10 2. Remote lacunar infarcts in the basal ganglia and thalami bilaterally, right greater than left. 3. Moderate atrophy and white matter changes, stable. The pertinent results were texted to Dr. Lindzen via the [facility] system at 11:25 am. Electronically signed by: Lonni Necessary MD 01/31/2024 11:42 AM EDT RP Workstation: [id number]   CT head interpreted by me as grossly negative for hemorrhage.  Detailed report above as per radiologist final read   PROCEDURES:  Critical Care performed: Yes, see critical care procedure note(s)  Procedures   MEDICATIONS ORDERED IN ED: Medications  nicardipine  (CARDENE ) 20mg  in 0.86% saline 200ml IV infusion (0.1 mg/ml) (has no administration in time range)  sodium chloride  flush (NS) 0.9 % injection 3 mL (3 mLs Intravenous Given 01/31/24 1150)  iohexol  (OMNIPAQUE ) 350 MG/ML injection 100 mL (100 mLs Intravenous Contrast Given 01/31/24 1146)     IMPRESSION / MDM / ASSESSMENT AND PLAN / ED COURSE  I reviewed the triage vital signs and the nursing notes.                              Code stroke initiated.  Patient within thrombolytic treatment window, however given her recent evaluation, history of hemorrhagic infarcts in the thalami, I have not concerned that thrombolytics would be very high risk and the risk would outweigh any benefit at this time.  Her current only neurologic symptom is mild dysarthria.  I also think there is a high probability she may have intracranial hemorrhage, and thus CT of the head being expedited along with a code stroke process.  ----------------------------------------- 11:13 AM on 01/31/2024 ----------------------------------------- In light of the patient's previous history, review of prior MRI with history of micro hemorrhagic infarcts, severe hypertension and  headache I am certainly concerned about the potential for intracranial hemorrhage, stroke Agbata hypertensive emergency, posterior reversible encephalopathy etc.  She has no acute cardiac symptoms.  No neck pain no back pain.  No abdominal pain.  Symptoms seem to be purely of a neurologic nature at this time.      Patient's presentation is most consistent with acute presentation with potential threat to life or bodily function.   The patient is on the cardiac monitor to evaluate for evidence of arrhythmia and/or significant heart rate changes.   Clinical Course as of 01/31/24 1212  Sun Jan 31, 2024  1203 Dr. Marcelino consulted. [MQ]  1203 Neurology Dr. Merrianne advises workup indicative of hypertensive emergency.  Recommends initiation of IV antihypertensive therapy.  At this juncture I have also paged nephrology to discuss medication selection due to the patient being on hemodialysis.  The patient also received IV contrast today. [MQ]  1209 Consultations with neurology, nephrology and critical care medicine.  All 3 physicians aware.  Nephrology to plaqued consultation and recommend that for end-stage renal disease patient initiate nicardipine .  Other services in agreement.  Dr. Malka has excepted the patient to the medical ICU.  Current goal systolic blood pressure 170-180.  Awaiting nicardipine  initiation [MQ]    Clinical Course User Index [MQ] Dicky Anes, MD   Labs interpreted as consistent with end-stage renal disease.  CBC without acute abnormality except for very slight thrombocytopenia.  Appropriate potassium and electrolytes.  Nephrology providing consultation  FINAL CLINICAL IMPRESSION(S) / ED DIAGNOSES   Final diagnoses:  Hypertensive emergency without congestive heart failure     Rx / DC Orders   ED Discharge Orders     None        Note:  This document was prepared using Dragon voice recognition software and may include unintentional dictation errors.   Dicky Anes,  MD 01/31/24 1212

## 2024-01-31 NOTE — ED Notes (Signed)
 Arrived in CT with pt at 1107.

## 2024-02-01 ENCOUNTER — Inpatient Hospital Stay

## 2024-02-01 DIAGNOSIS — I5032 Chronic diastolic (congestive) heart failure: Secondary | ICD-10-CM

## 2024-02-01 DIAGNOSIS — N186 End stage renal disease: Secondary | ICD-10-CM

## 2024-02-01 DIAGNOSIS — I160 Hypertensive urgency: Secondary | ICD-10-CM | POA: Diagnosis not present

## 2024-02-01 DIAGNOSIS — R748 Abnormal levels of other serum enzymes: Secondary | ICD-10-CM

## 2024-02-01 DIAGNOSIS — Z8673 Personal history of transient ischemic attack (TIA), and cerebral infarction without residual deficits: Secondary | ICD-10-CM | POA: Diagnosis not present

## 2024-02-01 DIAGNOSIS — E669 Obesity, unspecified: Secondary | ICD-10-CM

## 2024-02-01 DIAGNOSIS — Z992 Dependence on renal dialysis: Secondary | ICD-10-CM

## 2024-02-01 LAB — BASIC METABOLIC PANEL WITH GFR
Anion gap: 14 (ref 5–15)
BUN: 78 mg/dL — ABNORMAL HIGH (ref 6–20)
CO2: 24 mmol/L (ref 22–32)
Calcium: 8.5 mg/dL — ABNORMAL LOW (ref 8.9–10.3)
Chloride: 101 mmol/L (ref 98–111)
Creatinine, Ser: 8.42 mg/dL — ABNORMAL HIGH (ref 0.44–1.00)
GFR, Estimated: 5 mL/min — ABNORMAL LOW (ref >60)
Glucose, Bld: 95 mg/dL (ref 70–99)
Potassium: 4.3 mmol/L (ref 3.5–5.1)
Sodium: 139 mmol/L (ref 135–145)

## 2024-02-01 LAB — CBC
HCT: 38.5 % (ref 36.0–46.0)
Hemoglobin: 12.6 g/dL (ref 12.0–15.0)
MCH: 26.5 pg (ref 26.0–34.0)
MCHC: 32.7 g/dL (ref 30.0–36.0)
MCV: 81.1 fL (ref 80.0–100.0)
Platelets: 158 K/uL (ref 150–400)
RBC: 4.75 MIL/uL (ref 3.87–5.11)
RDW: 15.9 % — ABNORMAL HIGH (ref 11.5–15.5)
WBC: 5 K/uL (ref 4.0–10.5)
nRBC: 0 % (ref 0.0–0.2)

## 2024-02-01 LAB — PHOSPHORUS: Phosphorus: 4.6 mg/dL (ref 2.5–4.6)

## 2024-02-01 LAB — MAGNESIUM: Magnesium: 2.4 mg/dL (ref 1.7–2.4)

## 2024-02-01 MED ORDER — HYDRALAZINE HCL 25 MG PO TABS
25.0000 mg | ORAL_TABLET | Freq: Three times a day (TID) | ORAL | Status: DC
  Administered 2024-02-01: 25 mg via ORAL
  Filled 2024-02-01: qty 1

## 2024-02-01 MED ORDER — IRBESARTAN 150 MG PO TABS
300.0000 mg | ORAL_TABLET | Freq: Every day | ORAL | Status: DC
  Administered 2024-02-01 – 2024-02-02 (×2): 300 mg via ORAL
  Filled 2024-02-01 (×2): qty 2

## 2024-02-01 MED ORDER — HEPARIN SODIUM (PORCINE) 1000 UNIT/ML IJ SOLN
1000.0000 [IU] | INTRAMUSCULAR | Status: DC | PRN
  Administered 2024-02-01: 1000 [IU] via INTRAVENOUS

## 2024-02-01 MED ORDER — IRBESARTAN 150 MG PO TABS
300.0000 mg | ORAL_TABLET | Freq: Every day | ORAL | Status: DC
  Filled 2024-02-01: qty 2

## 2024-02-01 MED ORDER — CHLORHEXIDINE GLUCONATE CLOTH 2 % EX PADS
6.0000 | MEDICATED_PAD | Freq: Every day | CUTANEOUS | Status: DC
  Administered 2024-02-02: 6 via TOPICAL

## 2024-02-01 MED ORDER — HYDRALAZINE HCL 20 MG/ML IJ SOLN
10.0000 mg | Freq: Once | INTRAMUSCULAR | Status: AC
  Administered 2024-02-01: 10 mg via INTRAVENOUS
  Filled 2024-02-01: qty 1

## 2024-02-01 MED ORDER — HEPARIN SODIUM (PORCINE) 1000 UNIT/ML IJ SOLN
INTRAMUSCULAR | Status: AC
  Filled 2024-02-01: qty 4

## 2024-02-01 MED ORDER — POLYETHYLENE GLYCOL 3350 17 G PO PACK
17.0000 g | PACK | Freq: Every day | ORAL | Status: DC
  Administered 2024-02-01: 17 g via ORAL
  Filled 2024-02-01 (×2): qty 1

## 2024-02-01 MED ORDER — HYDRALAZINE HCL 50 MG PO TABS
50.0000 mg | ORAL_TABLET | Freq: Three times a day (TID) | ORAL | Status: DC
  Administered 2024-02-01 – 2024-02-02 (×3): 50 mg via ORAL
  Filled 2024-02-01 (×3): qty 1

## 2024-02-01 MED ORDER — HYDRALAZINE HCL 25 MG PO TABS
25.0000 mg | ORAL_TABLET | Freq: Once | ORAL | Status: AC
  Administered 2024-02-01: 25 mg via ORAL
  Filled 2024-02-01 (×2): qty 1

## 2024-02-01 MED ORDER — LABETALOL HCL 200 MG PO TABS
200.0000 mg | ORAL_TABLET | Freq: Two times a day (BID) | ORAL | Status: DC
  Administered 2024-02-01 – 2024-02-02 (×2): 200 mg via ORAL
  Filled 2024-02-01 (×3): qty 1

## 2024-02-01 NOTE — Plan of Care (Signed)

## 2024-02-01 NOTE — Progress Notes (Signed)
 PT Cancellation Note  Patient Details Name: Allison Hill MRN: [id number] DOB: 1970/09/08   Cancelled Treatment:    Reason Eval/Treat Not Completed: Other (comment). Pt out of room at this time, PT to re-attempt as able.    Doyal Shams PT, DPT 1:03 PM,02/01/24

## 2024-02-01 NOTE — Progress Notes (Signed)
 Pt to MRI via bed; on telemetry.

## 2024-02-01 NOTE — Progress Notes (Signed)
 Progress Note   Patient: Allison Hill FMW:[id number] DOB: 04-Apr-1971 DOA: 01/31/2024     1 DOS: the patient was seen and examined on 02/01/2024   Brief hospital course: 53 yo female who presented to [facility] ER on 08/31 via EMS from [facility] with hypertension and a severe headache.  Per ER notes it was reported pts bp 190/112, the facility administered clonidine   and following administration bp decreased to 185/121.  She does have ESRD on hemodialysis (M-Tues-Wed-Fri which are shorter sessions).  She reports she has not missed any HD sessions her last session was on 08/29.  She reports she does not think she has been receiving her antihypertensive medications as prescribed at the facility.     ED Course Upon arrival to the ER pt noted to have a little slurred speech; LLE weakness worse than normal; and left foot numbness.   She does have have a stroke hx with residual left-sided deficits.  Code stroke activated.  CT Head negative for acute intracranial abnormality but ASPECTS 10.  CTA Head/Neck revealed no evidence of significant stenosis, aneurysmal dilatation, or dissection involving the arteries of the head and neck.  Pt deemed not a candidate for TNK due to hx of hemorrhagic infarcts in the thalami, therefore risk outweighed the benefits.  Pts bp remained elevated 200's/100's.  Nephrology and Neurology recommended iv antihypertensive therapy, therefore nicardipine  gtt ordered and goal sbp 170-180.  PCCM team contacted for ICU admission.    Significant ED lab results: BUN 67/creatinine 7.61/calcium  8.5/alk phos 351/platelet count 145.   CTA Head/Neck: No acute intracranial hemorrhage or ischemic change. No evidence of significant stenosis, aneurysmal dilatation, or dissection involving the arteries of the head and neck. Mild tortuosity of the cervical right ICA and left common carotid artery and mid cervical ICA without significant stenosis. CT Head: No acute intracranial abnormality.  Aspects 10/10. Remote lacunar infarcts in the basal ganglia and thalami bilaterally, right greater than left. Moderate atrophy and white matter changes, stable.  9/1.  Blood pressure still high.  Nephrology increased Avapro  up to 300 mg.  MRI of the brain negative for acute event.  Patient for dialysis today.  Assessment and Plan: * Hypertensive urgency Patient never placed on continuous drip and restarted on usual medications.  Nephrology increased Avapro  to 300 mg but will likely need hydralazine  also.  Chronic heart failure with preserved ejection fraction (HFpEF) (HCC) EF 55% with moderate mitral  valve regurgitation.  Dialysis to remove fluid.  Patient euvolemic.  ESRD on dialysis [facility]) Dialysis today  History of stroke Chronic left-sided weakness.  MRI of the brain negative for acute stroke.  Obesity (BMI 30-39.9) BMI 30.70, class I obesity.  Elevated serum alkaline phosphatase level Seems chronically elevated could be Paget's disease of bone        Subjective: Patient came in with headache.  Has chronic left-sided weakness.  Has elevated blood pressure.  Physical Exam: Vitals:   02/01/24 1157 02/01/24 1324 02/01/24 1330 02/01/24 1400  BP: (!) 175/108 (!) 181/107 (!) 193/119 (!) 182/128  Pulse: 72 71 74 78  Resp: 18 15 19    Temp: (!) 97.4 F (36.3 C) 97.7 F (36.5 C)    TempSrc: Oral Oral    SpO2: 100% 99% 98% 98%  Weight:      Height:       Physical Exam HENT:     Head: Normocephalic.     Mouth/Throat:     Pharynx: No oropharyngeal exudate.  Eyes:  General: Lids are normal.     Conjunctiva/sclera: Conjunctivae normal.  Cardiovascular:     Rate and Rhythm: Normal rate and regular rhythm.     Heart sounds: S1 normal and S2 normal. Murmur heard.     Systolic murmur is present with a grade of 2/6.  Pulmonary:     Breath sounds: No decreased breath sounds, wheezing, rhonchi or rales.  Abdominal:     Palpations: Abdomen is soft.     Tenderness: There is  no abdominal tenderness.  Musculoskeletal:     Right lower leg: No swelling.     Left lower leg: No swelling.  Skin:    General: Skin is warm.     Findings: No rash.  Neurological:     Mental Status: She is alert and oriented to person, place, and time.     Data Reviewed: MRI brain negative for stroke, CT angio negative for stroke or large vessel occlusion, creatinine 8.42, CBC with white count of 5.0, hemoglobin 12.6, platelet count 158  Family Communication: Spoke with daughter on the phone  Disposition: Status is: Observation Patient will need better blood pressure control to avoid symptoms prior to disposition.  Avapro  increased to 300 mg will also add hydralazine .  Planned Discharge Destination: Long-term care    Time spent: 28 minutes  Author: Charlie Patterson, MD 02/01/2024 2:57 PM  For on call review [email or web address].

## 2024-02-01 NOTE — Assessment & Plan Note (Deleted)
 Patient never placed on continuous drip and restarted on usual medications.  Nephrology increased Avapro  to 300 mg but will likely need hydralazine  also.

## 2024-02-01 NOTE — Assessment & Plan Note (Addendum)
 EF 55% with moderate mitral  valve regurgitation.  Dialysis to remove fluid.  Patient euvolemic.

## 2024-02-01 NOTE — Procedures (Signed)
 Received patient in bed to unit.  Alert and oriented.  Informed consent signed and in chart.   TX duration:3.5hrs  Patient was hypertensive before the treatment ends.  Transported back to the room  Alert, without acute distress.  Hand-off given to patient's nurse.   Access used: Right Chest HD catheter.  Access issues: NONE  Total UF removed: 2L Medication(s) given: Irbesartan  tab 300, Imdur  120mg  tab, Amlodopine 10mg  tab, Hydralazine  25mg  tab, metoprolol  50mg  tab.     [facility]

## 2024-02-01 NOTE — Care Management CC44 (Signed)
 Condition Code 44 Documentation Completed  Patient Details  Name: Allison Hill MRN: [id number] Date of Birth: 1970/06/08   Condition Code 44 given:  Yes Patient signature on Condition Code 44 notice:  Yes Documentation of 2 MD's agreement:  Yes Code 44 added to claim:  Yes    Christee Mervine, LCSW 02/01/2024, 12:40 PM

## 2024-02-01 NOTE — Progress Notes (Signed)
 Report called to [facility] RN on 1A. Pt to travel to MRI first and theto 1A room 137. Pt is scheduled for hemodialysis this afternoon, 12 or 12:30 pm. Report has been given to dialysis nurse. Meds given. Per Nephrologist, metoprolol  and amlodipine  to be held until after dialysis.

## 2024-02-01 NOTE — Hospital Course (Signed)
 53 yo female who presented to [facility] ER on 08/31 via EMS from [facility] with hypertension and a severe headache.  Per ER notes it was reported pts bp 190/112, the facility administered clonidine   and following administration bp decreased to 185/121.  She does have ESRD on hemodialysis (M-Tues-Wed-Fri which are shorter sessions).  She reports she has not missed any HD sessions her last session was on 08/29.  She reports she does not think she has been receiving her antihypertensive medications as prescribed at the facility.     ED Course Upon arrival to the ER pt noted to have a little slurred speech; LLE weakness worse than normal; and left foot numbness.   She does have have a stroke hx with residual left-sided deficits.  Code stroke activated.  CT Head negative for acute intracranial abnormality but ASPECTS 10.  CTA Head/Neck revealed no evidence of significant stenosis, aneurysmal dilatation, or dissection involving the arteries of the head and neck.  Pt deemed not a candidate for TNK due to hx of hemorrhagic infarcts in the thalami, therefore risk outweighed the benefits.  Pts bp remained elevated 200's/100's.  Nephrology and Neurology recommended iv antihypertensive therapy, therefore nicardipine  gtt ordered and goal sbp 170-180.  PCCM team contacted for ICU admission.    Significant ED lab results: BUN 67/creatinine 7.61/calcium  8.5/alk phos 351/platelet count 145.   CTA Head/Neck: No acute intracranial hemorrhage or ischemic change. No evidence of significant stenosis, aneurysmal dilatation, or dissection involving the arteries of the head and neck. Mild tortuosity of the cervical right ICA and left common carotid artery and mid cervical ICA without significant stenosis. CT Head: No acute intracranial abnormality. Aspects 10/10. Remote lacunar infarcts in the basal ganglia and thalami bilaterally, right greater than left. Moderate atrophy and white matter changes, stable.  9/1.  Blood  pressure still high.  Nephrology increased Avapro  up to 300 mg.  MRI of the brain negative for acute event.  Patient for dialysis today.

## 2024-02-01 NOTE — Progress Notes (Signed)
 OT Cancellation Note  Patient Details Name: Allison Hill MRN: [id number] DOB: November 03, 1970   Cancelled Treatment:    Reason Eval/Treat Not Completed: Patient at procedure or test/ unavailable. Pt out of the room. Per chart, pt taken to MRI then going to 1A vs dialysis. Will re-attempt OT evaluation at later date/time as pt is available and appropriate.   Jaicee Michelotti R., MPH, MS, OTR/L ascom [phone] 02/01/24, 11:42 AM
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
## 2024-02-01 NOTE — Progress Notes (Signed)
 [facility]  ROUNDING NOTE   Subjective:   Patient with past medical history of ESRD on dialysis at [facility] Monday, Tuesday, Wednesday, Friday, severe hypertension who came in with slightly slurred speech and numbness.  She was found to have hypertensive urgency and was started on nicardipine  drip.  Blood pressure better today.  Patient subsequently restarted on amlodipine , clonidine , irbesartan , Imdur , and metoprolol .  Update: Blood pressure still high at 167/104 but improved as compared to yesterday as systolic blood pressure was greater than 200.  She will be due for hemodialysis treatment today.  Objective:  Vital signs in last 24 hours:  Temp:  [97.7 F (36.5 C)-98.6 F (37 C)] 97.9 F (36.6 C) (09/01 0811) Pulse Rate:  [66-85] 79 (09/01 0900) Resp:  [11-27] 24 (09/01 0900) BP: (133-220)/(83-133) 167/104 (09/01 0900) SpO2:  [94 %-100 %] 100 % (09/01 0900) Weight:  [78.6 kg] 78.6 kg (09/01 0443)  Weight change:  Filed Weights   01/31/24 1303 02/01/24 0443  Weight: 78.6 kg 78.6 kg    Intake/Output: I/O last 3 completed shifts: In: 420 [P.O.:420] Out: 400 [Urine:400]   Intake/Output this shift:  No intake/output data recorded.  Physical Exam: General: No acute distress  Head: Normocephalic, atraumatic. Moist oral mucosal membranes  Neck: Supple  Lungs:  Clear to auscultation, normal effort  Heart: S1S2 no rubs  Abdomen:  Soft, nontender, bowel sounds present  Extremities: Trace peripheral edema.  Neurologic: Awake, alert, following commands  Skin: No acute rash  With past Basic Metabolic Panel: Recent Labs  Lab 01/31/24 1101 02/01/24 0351  NA 141 139  K 4.1 4.3  CL 101 101  CO2 26 24  GLUCOSE 97 95  BUN 67* 78*  CREATININE 7.61* 8.42*  CALCIUM  8.5* 8.5*  MG  --  2.4  PHOS  --  4.6    Liver Function Tests: Recent Labs  Lab 01/31/24 1101  AST 18  ALT 12  ALKPHOS 351*  BILITOT 0.7  PROT 7.6  ALBUMIN 3.6   No results for input(s):  LIPASE, AMYLASE in the last 168 hours. No results for input(s): AMMONIA in the last 168 hours.  CBC: Recent Labs  Lab 01/31/24 1101 02/01/24 0351  WBC 5.2 5.0  NEUTROABS 3.4  --   HGB 13.1 12.6  HCT 40.0 38.5  MCV 81.8 81.1  PLT 145* 158    Cardiac Enzymes: No results for input(s): CKTOTAL, CKMB, CKMBINDEX, TROPONINI in the last 168 hours.  BNP: Invalid input(s): POCBNP  CBG: Recent Labs  Lab 01/31/24 1307  GLUCAP 81    Microbiology: Results for orders placed or performed during the hospital encounter of 01/31/24  MRSA Next Gen by PCR, Nasal     Status: Abnormal   Collection Time: 01/31/24  1:13 PM   Specimen: Nasal Mucosa; Nasal Swab  Result Value Ref Range Status   MRSA by PCR Next Gen DETECTED (A) NOT DETECTED Final    Comment: CRITICAL RESULT CALLED TO, READ BACK BY AND VERIFIED WITH: KATHERINE CLAYTON 01/31/24 1517 SLM (NOTE) The GeneXpert MRSA Assay (FDA approved for NASAL specimens only), is one component of a comprehensive MRSA colonization surveillance program. It is not intended to diagnose MRSA infection nor to guide or monitor treatment for MRSA infections. Test performance is not FDA approved in patients less than 31 years old. Performed at [facility], [address] [place], [place] [address]     Coagulation Studies: Recent Labs    01/31/24 1101  LABPROT 14.7  INR  1.1    Urinalysis: No results for input(s): COLORURINE, LABSPEC, PHURINE, GLUCOSEU, HGBUR, BILIRUBINUR, KETONESUR, PROTEINUR, UROBILINOGEN, NITRITE, LEUKOCYTESUR in the last 72 hours.  Invalid input(s): APPERANCEUR    Imaging: CT ANGIO HEAD NECK W WO CM W PERF (CODE STROKE) Result Date: 01/31/2024 EXAM: CTA Head and Neck without and with Intravenous Contrast. CT Head without Contrast. CLINICAL HISTORY: Neuro deficit, acute, stroke suspected. Code stroke. Neuro MD: Camellia Shark [phone]; Pt getting dialysis tomorrow.  TECHNIQUE: Axial CTA images of the head and neck performed with intravenous contrast. MIP reconstructed images were created and reviewed. Axial computed tomography images of the head/brain performed without intravenous contrast. Note: Per PQRS, the description of internal carotid artery percent stenosis, including 0 percent or normal exam, is based on [place] American Symptomatic Carotid Endarterectomy Trial (NASCET) criteria. Dose reduction technique was used including one or more of the following: automated exposure control, adjustment of mA and kV according to patient size, and/or iterative reconstruction. CONTRAST: Without and with; 100 mL iohexol  (Omnipaque ) 350 mg/mL injection. COMPARISON: CT of the head without contrast 01/31/2024. CT angio head and neck 01/21/2024. FINDINGS: CT HEAD: BRAIN: No acute intraparenchymal hemorrhage. No mass lesion. No CT evidence for acute territorial infarct. No midline shift or extra-axial collection. VENTRICLES: No hydrocephalus. ORBITS: The orbits are unremarkable. SINUSES AND MASTOIDS: The paranasal sinuses and mastoid air cells are clear. CTA NECK: COMMON CAROTID ARTERIES: No significant stenosis. No dissection or occlusion. Atherosclerotic calcifications are present at the aortic arch and proximal left subclavian artery without significant stenoses or aneurysm. No dissection is present. INTERNAL CAROTID ARTERIES: No stenosis by NASCET criteria. No dissection or occlusion. Minimal calcification is present at the right carotid bifurcation without significant stenosis. Mild tortuosity of the cervical right ICA is again noted. Tortuosity is present in the left common carotid artery and mid cervical ICA without significant stenosis. VERTEBRAL ARTERIES: No significant stenosis. No dissection or occlusion. CTA HEAD: ANTERIOR CEREBRAL ARTERIES: No significant stenosis. No occlusion. No aneurysm. MIDDLE CEREBRAL ARTERIES: No significant stenosis. No occlusion. No aneurysm. POSTERIOR  CEREBRAL ARTERIES: No significant stenosis. No occlusion. No aneurysm. BASILAR ARTERY: No significant stenosis. No occlusion. No aneurysm. OTHER: Atherosclerotic calcifications are again noted within the cavernous internal carotid arteries bilaterally without significant stenoses through the ICA terminus. SOFT TISSUES: No acute finding. No masses or lymphadenopathy. A right IJ catheter is stable. BONES: No acute osseous abnormality. IMPRESSION: 1. No acute intracranial hemorrhage or ischemic change. 2. No evidence of significant stenosis, aneurysmal dilatation, or dissection involving the arteries of the head and neck. 3. Mild tortuosity of the cervical right ICA and left common carotid artery and mid cervical ICA without significant stenosis. THE PERTINENT RESULTS WERE TEXTED TO DR. LINDZEN VIA THE [facility] SYSTEM AT 11:54 AM. Electronically signed by: Lonni Necessary MD 01/31/2024 11:59 AM EDT RP Workstation: [id number]   CT HEAD CODE STROKE WO CONTRAST` Result Date: 01/31/2024 EXAM: CT HEAD WITHOUT 01/31/2024 11:34:02 AM TECHNIQUE: CT of the head was performed without the administration of intravenous contrast. Automated exposure control, iterative reconstruction, and/or weight based adjustment of the mA/kV was utilized to reduce the radiation dose to as low as reasonably achievable. COMPARISON: MR head without contrast 01/22/2024. CT head and CT angio head and neck 01/21/2024. CLINICAL HISTORY: Headache, sudden, severe. FINDINGS: BRAIN AND VENTRICLES: No acute intracranial hemorrhage. No mass effect or midline shift. No extra-axial fluid collection. No evidence of acute infarct. No hydrocephalus. Remote lacunar infarcts are again noted in the basal ganglia and thalami bilaterally, right  greater than left. Moderate atrophy and white matter changes are stable. [place] stroke program early CT (ASPECT) score ----- Ganglionic (caudate, IC, lentiform nucleus, insula, M1-M3): 7 Supraganglionic (M4-M6): 3 Total: 10  ORBITS: No acute abnormality. SINUSES AND MASTOIDS: No acute abnormality. SOFT TISSUES AND SKULL: No acute skull fracture. No acute soft tissue abnormality. IMPRESSION: 1. No acute intracranial abnormality. Aspects 10/10 2. Remote lacunar infarcts in the basal ganglia and thalami bilaterally, right greater than left. 3. Moderate atrophy and white matter changes, stable. The pertinent results were texted to Dr. Merrianne via the [facility] system at 11:25 am. Electronically signed by: Lonni Necessary MD 01/31/2024 11:42 AM EDT RP Workstation: [id number]     Medications:    niCARDipine       amLODipine   10 mg Oral Daily   atorvastatin   40 mg Oral Daily   Chlorhexidine  Gluconate Cloth  6 each Topical Daily   Chlorhexidine  Gluconate Cloth  6 each Topical Q0600   cloNIDine   0.3 mg Transdermal Weekly   diclofenac  Sodium  2 g Topical BID   gabapentin   200 mg Oral TID   irbesartan   300 mg Oral Daily   isosorbide  mononitrate  120 mg Oral QPM   melatonin  5 mg Oral QHS   metoprolol  tartrate  50 mg Oral BID   mupirocin  ointment  1 Application Nasal BID   pantoprazole   40 mg Oral Daily   sevelamer  carbonate  1,600 mg Oral TID with meals   docusate sodium , fluticasone , ondansetron  (ZOFRAN ) IV, polyethylene glycol  Assessment/ Plan:  53 y.o. female with past medical history of ESRD on HD, hypertension, congestive heart failure, migraine headaches, history of intracranial hemorrhage, cocaine abuse, sickle cell trait who was admitted with hypertensive urgency.  1.  Hypertensive urgency.  Patient came in with systolic blood pressure greater than 200.  She was started on nicardipine  drip with good response.  Patient now restarted on amlodipine , clonidine , irbesartan , isosorbide  mononitrate, and metoprolol .  Irbesartan  was increased to 300 mg daily this a.m.  Continue to monitor blood pressure trend.  2.  End-stage renal disease.  Patient on dialysis at [facility] on Monday/Tuesday/Wednesday/Friday.  Due for  dialysis treatment today.  3.  Anemia of chronic kidney disease.  Hemoglobin of 12.6.  No immediate need for Epogen .  4.  Secondary hyperparathyroidism.  Most recent serum phosphorus 4.6 and at target.  Continue Renvela  1.6 g p.o. 3 times daily.  5.  Thank you consultation.  Treatment.   LOS: 1 Lindaann Gradilla 9/1/20259:33 AM

## 2024-02-01 NOTE — Procedures (Signed)
 Patient is hypertensive in the 200's SBP. All morning medications that were withheld were already given. The patient was getting upset and would not want to take medications anymore. Dr. Marcelino and Dr. Josette were aware.

## 2024-02-01 NOTE — Assessment & Plan Note (Signed)
 Seems chronically elevated could be Paget's disease of bone

## 2024-02-01 NOTE — Assessment & Plan Note (Signed)
 BMI 31.52, class I obesity.

## 2024-02-01 NOTE — Assessment & Plan Note (Signed)
 Dialysis today

## 2024-02-01 NOTE — Assessment & Plan Note (Signed)
 Chronic left-sided weakness.  MRI of the brain negative for acute stroke.

## 2024-02-01 NOTE — Assessment & Plan Note (Signed)
 Patient never placed on continuous drip and restarted on usual medications.  Nephrology increased Avapro  to 300 mg, hydralazine  added.  Metoprolol  changed over to labetalol .  BP improved today and can go back to facility.

## 2024-02-02 DIAGNOSIS — N186 End stage renal disease: Secondary | ICD-10-CM | POA: Diagnosis not present

## 2024-02-02 DIAGNOSIS — Z8673 Personal history of transient ischemic attack (TIA), and cerebral infarction without residual deficits: Secondary | ICD-10-CM | POA: Diagnosis not present

## 2024-02-02 DIAGNOSIS — I5032 Chronic diastolic (congestive) heart failure: Secondary | ICD-10-CM | POA: Diagnosis not present

## 2024-02-02 DIAGNOSIS — I160 Hypertensive urgency: Secondary | ICD-10-CM | POA: Diagnosis not present

## 2024-02-02 MED ORDER — MUPIROCIN 2 % EX OINT: 1.0000 | TOPICAL_OINTMENT | Freq: Two times a day (BID) | CUTANEOUS | 0 refills | Status: AC

## 2024-02-02 MED ORDER — LABETALOL HCL 200 MG PO TABS: 200.0000 mg | ORAL_TABLET | Freq: Two times a day (BID) | ORAL | 0 refills | Status: DC

## 2024-02-02 MED ORDER — IRBESARTAN 300 MG PO TABS: 300.0000 mg | ORAL_TABLET | Freq: Every day | ORAL | 0 refills | Status: AC

## 2024-02-02 MED ORDER — HYDRALAZINE HCL 50 MG PO TABS: 50.0000 mg | ORAL_TABLET | Freq: Three times a day (TID) | ORAL | 0 refills | Status: DC

## 2024-02-02 NOTE — Evaluation (Signed)
 Physical Therapy Evaluation Patient Details Name: Allison Hill MRN: [id number] DOB: May 12, 1971 Today's Date: 02/02/2024  History of Present Illness  53 yo female who presented to [facility] ER on 08/31 via EMS from [facility] with hypertension and a severe headache. PMH: ESRD on hemodialysis ([facility]), HTN, CHF, hx of CVA with L sided deficits  Clinical Impression  Pt is a pleasant 53 year old female who was admitted for hypertension and increased weakness. Pt performs bed mobility, transfers, and ambulation with CGA- supervision. She demonstrates decreased strength and coordination on her left side compared to her right side. Prior to hospitalization, pt was ambulating around her LTC using a rollator for short distances and WC for longer distances. Pt demonstrates deficits with strength/balance/activity tolerance, and is not functioning at her baseline. Discussed with patient the benefits of SNF vs. HH, and she preferred home health PT. Would benefit from skilled PT to address above deficits and promote optimal return to PLOF.       If plan is discharge home, recommend the following: A little help with walking and/or transfers;A little help with bathing/dressing/bathroom   Can travel by private vehicle        Equipment Recommendations None recommended by PT  Recommendations for Other Services       Functional Status Assessment Patient has had a recent decline in their functional status and demonstrates the ability to make significant improvements in function in a reasonable and predictable amount of time.     Precautions / Restrictions Precautions Precautions: Fall Recall of Precautions/Restrictions: Intact Restrictions Weight Bearing Restrictions Per Provider Order: No      Mobility  Bed Mobility Overal bed mobility: Modified Independent             General bed mobility comments: NT. Pt receieved sitting at EOB pre/post session to finish her meal     Transfers Overall transfer level: Needs assistance Equipment used: Rolling walker (2 wheels) Transfers: Sit to/from Stand Sit to Stand: Supervision           General transfer comment: Stand from EOB with supervision    Ambulation/Gait Ambulation/Gait assistance: Contact guard assist             General Gait Details: Handoff from OT, who was CGA with ambulation outside of room. Pt declined amb with SPT d/t fatigue.  Stairs            Wheelchair Mobility     Tilt Bed    Modified Rankin (Stroke Patients Only)       Balance Overall balance assessment: Needs assistance Sitting-balance support: No upper extremity supported, Feet unsupported Sitting balance-Leahy Scale: Good Sitting balance - Comments: Able to maintain balance while seated at EOB with SPT performing MMTs.                                     Pertinent Vitals/Pain Pain Assessment Pain Assessment: No/denies pain    Home Living Family/patient expects to be discharged to:: Skilled nursing facility                   Additional Comments: LTC at [facility]    Prior Function Prior Level of Function : Needs assist             Mobility Comments: Rollator in room and wc for longer distances ADLs Comments: facility assists with bathing,  medication management, and meal prep. no assitance needed with dressing  or toileting     Extremity/Trunk Assessment   Upper Extremity Assessment Upper Extremity Assessment: LUE deficits/detail LUE Deficits / Details: hx CVA. LUE strength <RUE. Unable to coordinate RAMPs for LUE. LUE Coordination: decreased gross motor    Lower Extremity Assessment Lower Extremity Assessment: LLE deficits/detail LLE Deficits / Details: LLE strength <RLE with seated hip flexion. Numbness/tingling at l foot at baseline. Slowed RAMPs for LLE.       Communication   Communication Communication: No apparent difficulties    Cognition Arousal:  Alert Behavior During Therapy: WFL for tasks assessed/performed   PT - Cognitive impairments: No apparent impairments                       PT - Cognition Comments: Pt is pleasant and agreeable to PT session Following commands: Intact       Cueing       General Comments General comments (skin integrity, edema, etc.): seated after walking: BP 154/99, MAP 112, HR 95    Exercises Other Exercises Other Exercises: Edu about differences between [facility] PT and rehab at SNF   Assessment/Plan    PT Assessment Patient needs continued PT services  PT Problem List Decreased strength;Decreased activity tolerance;Decreased balance;Decreased mobility       PT Treatment Interventions DME instruction;Gait training;Functional mobility training;Therapeutic activities;Therapeutic exercise;Balance training;Neuromuscular re-education;Patient/family education    PT Goals (Current goals can be found in the Care Plan section)  Acute Rehab PT Goals Patient Stated Goal: to go home PT Goal Formulation: With patient Time For Goal Achievement: 02/16/24 Potential to Achieve Goals: Good    Frequency Min 2X/week     Co-evaluation               AM-PAC PT 6 Clicks Mobility  Outcome Measure Help needed turning from your back to your side while in a flat bed without using bedrails?: A Little Help needed moving from lying on your back to sitting on the side of a flat bed without using bedrails?: A Little Help needed moving to and from a bed to a chair (including a wheelchair)?: A Little Help needed standing up from a chair using your arms (e.g., wheelchair or bedside chair)?: A Little Help needed to walk in hospital room?: A Little Help needed climbing 3-5 steps with a railing? : A Lot 6 Click Score: 17    End of Session Equipment Utilized During Treatment: Gait belt Activity Tolerance: Patient tolerated treatment well;Patient limited by fatigue Patient left: in bed;with bed alarm  set;with call bell/phone within reach Nurse Communication: Mobility status PT Visit Diagnosis: Unsteadiness on feet (R26.81);Muscle weakness (generalized) (M62.81)    Time: 9061-9052 PT Time Calculation (min) (ACUTE ONLY): 9 min   Charges:                 Kin Galbraith, SPT   Assunta Pupo 02/02/2024, 11:49 AM

## 2024-02-02 NOTE — Evaluation (Signed)
 Occupational Therapy Evaluation Patient Details Name: Allison Hill MRN: [id number] DOB: Jun 04, 1970 Today's Date: 02/02/2024   History of Present Illness   53 yo female who presented to [facility] ER on 08/31 via EMS from [facility] with hypertension and a severe headache. PMH: ESRD on hemodialysis ([facility]), HTN, CHF, hx of CVA with L sided deficits    Clinical Impressions Ms Hoefer was seen for OT evaluation this date. Prior to hospital admission, pt was MOD I using 4WW or w/c. Pt lives at [facility]. Pt currently requires SBA don underwear, mild balance deficits noted pulling up over rear in standing. CGA + RW for ADL t/f ~60 ft. Educated on safety of using RW vs [id number]. Pt would benefit from skilled OT to address noted impairments and functional limitations (see below for any additional details). Upon hospital discharge, recommend return to facility with follow up therapy.     If plan is discharge home, recommend the following:   A little help with bathing/dressing/bathroom;Help with stairs or ramp for entrance     Functional Status Assessment   Patient has had a recent decline in their functional status and demonstrates the ability to make significant improvements in function in a reasonable and predictable amount of time.     Equipment Recommendations   None recommended by OT     Recommendations for Other Services         Precautions/Restrictions   Precautions Precautions: Fall Recall of Precautions/Restrictions: Intact Restrictions Weight Bearing Restrictions Per Provider Order: No     Mobility Bed Mobility Overal bed mobility: Modified Independent                  Transfers Overall transfer level: Needs assistance Equipment used: Rolling walker (2 wheels) Transfers: Sit to/from Stand Sit to Stand: Supervision                  Balance Overall balance assessment: Needs assistance Sitting-balance support: No upper extremity supported,  Feet supported Sitting balance-Leahy Scale: Good     Standing balance support: No upper extremity supported, During functional activity Standing balance-Leahy Scale: Fair                             ADL either performed or assessed with clinical judgement   ADL Overall ADL's : Needs assistance/impaired                                       General ADL Comments: SBA don underwear, mild balance deficits noted pulling up over rear in standing. CGA + RW for ADL t/f ~60 ft     Vision         Perception         Praxis         Pertinent Vitals/Pain Pain Assessment Pain Assessment: No/denies pain     Extremity/Trunk Assessment Upper Extremity Assessment Upper Extremity Assessment: LUE deficits/detail LUE Deficits / Details: hx CVA, mild deficits   Lower Extremity Assessment Lower Extremity Assessment: Generalized weakness       Communication Communication Communication: No apparent difficulties   Cognition Arousal: Alert Behavior During Therapy: WFL for tasks assessed/performed Cognition: No apparent impairments                               Following commands: Intact  Cueing  General Comments      seated after walking: BP 154/99, MAP 112, HR 95   Exercises     Shoulder Instructions      Home Living Family/patient expects to be discharged to:: Skilled nursing facility                                 Additional Comments: LTC at [facility]      Prior Functioning/Environment Prior Level of Function : Needs assist             Mobility Comments: 4WW in room, w/c for hallway mobility to dining hall ADLs Comments: facility assists with bathing,  medication management, and meal prep    OT Problem List: Decreased strength;Decreased range of motion;Decreased activity tolerance;Impaired balance (sitting and/or standing);Decreased safety awareness   OT Treatment/Interventions: Self-care/ADL  training;Therapeutic exercise;Energy conservation;DME and/or AE instruction;Therapeutic activities      OT Goals(Current goals can be found in the care plan section)   Acute Rehab OT Goals Patient Stated Goal: to go home OT Goal Formulation: With patient Time For Goal Achievement: 02/16/24 Potential to Achieve Goals: Good ADL Goals Pt Will Perform Grooming: with modified independence;standing Pt Will Perform Lower Body Dressing: with modified independence;sit to/from stand Pt Will Transfer to Toilet: with modified independence;ambulating;regular height toilet   OT Frequency:  Min 2X/week    Co-evaluation              AM-PAC OT 6 Clicks Daily Activity     Outcome Measure Help from another person eating meals?: None Help from another person taking care of personal grooming?: A Little Help from another person toileting, which includes using toliet, bedpan, or urinal?: A Little Help from another person bathing (including washing, rinsing, drying)?: A Little Help from another person to put on and taking off regular upper body clothing?: None Help from another person to put on and taking off regular lower body clothing?: A Little 6 Click Score: 20   End of Session Equipment Utilized During Treatment: Rolling walker (2 wheels);Gait belt  Activity Tolerance: Patient tolerated treatment well Patient left: in bed;with call bell/phone within reach (seated with PT)  OT Visit Diagnosis: Other abnormalities of gait and mobility (R26.89);Muscle weakness (generalized) (M62.81)                Time: 9079-9062 OT Time Calculation (min): 17 min Charges:  OT General Charges $OT Visit: 1 Visit OT Evaluation $OT Eval Low Complexity: 1 Low  Elston Slot, M.S. OTR/L  02/02/24, 9:57 AM  ascom [phone]

## 2024-02-02 NOTE — Progress Notes (Signed)
 Report given to [facility] and [employer] facilty @ [phone]. Belongings handed to patient and awaiting transport.

## 2024-02-02 NOTE — Progress Notes (Signed)
 Mobility Specialist Progress Note:    02/02/24 1500  Mobility  Activity Ambulated with assistance;Pivoted/transferred from chair to bed  Level of Assistance Contact guard assist, steadying assist  Assistive Device Front wheel walker  Distance Ambulated (ft) 5 ft  Range of Motion/Exercises Active;All extremities  Activity Response Tolerated well  Mobility visit 1 Mobility  Mobility Specialist Start Time (ACUTE ONLY) 1447  Mobility Specialist Stop Time (ACUTE ONLY) 1500  Mobility Specialist Time Calculation (min) (ACUTE ONLY) 13 min   Pt received requesting assistance. Required CGA to stand and ambulate with RW. Tolerated well, asx throughout. Alarm on, all needs met.  Sherrilee Ditty Mobility Specialist Please contact via Special educational needs teacher or  Rehab office at [phone]

## 2024-02-02 NOTE — TOC Transition Note (Signed)
 Transition of Care [facility]) - Discharge Note   Patient Details  Name: Allison Hill MRN: [id number] Date of Birth: 29-Oct-1970  Transition of [facility]) CM/SW Contact:  Alvaro Louder, LCSW Phone Number: 02/02/2024, 3:45 PM   Clinical Narrative:      LCSWA confirmed with MD that patient is stable for discharge. LCSWA notified the patient and they are in agreement with discharge. LCSWA confirmed bed is available at [facility]. Transport arranged with [facility] for next available.  Number to call report [phone]. RM: S-16   TOC signing off   Barriers to Discharge: Continued Medical Work up   Patient Goals and CMS Choice            Discharge Placement                       Discharge Plan and Services Additional resources added to the After Visit Summary for                                       Social Drivers of Health (SDOH) Interventions SDOH Screenings   Food Insecurity: No Food Insecurity (01/31/2024)  Housing: Low Risk  (01/31/2024)  Transportation Needs: No Transportation Needs (01/31/2024)  Utilities: Not At Risk (01/31/2024)  Financial Resource Strain: Patient Declined (01/03/2023)   Received from [facility]  Social Connections: Socially Isolated (11/10/2023)  Tobacco Use: Medium Risk (01/31/2024)     Readmission Risk Interventions    02/01/2024   12:28 PM  Readmission Risk Prevention Plan  Transportation Screening Complete  Medication Review (RN Care Manager) Complete  PCP or Specialist appointment within 3-5 days of discharge Complete  Palliative Care Screening Not Applicable  Skilled Nursing Facility Complete

## 2024-02-02 NOTE — Progress Notes (Signed)
 [facility]  ROUNDING NOTE   Subjective:   Patient with past medical history of ESRD on dialysis at [facility] Monday, Tuesday, Wednesday, Friday, severe hypertension who came in with slightly slurred speech and numbness.  She was found to have hypertensive urgency and was started on nicardipine  drip.  Blood pressure better today.  Patient subsequently restarted on amlodipine , clonidine , irbesartan , Imdur , and metoprolol .  Update: Patient seen sitting at side of bed Denies pain Room air Denies headache or dizziness  Objective:  Vital signs in last 24 hours:  Temp:  [97.3 F (36.3 C)-99 F (37.2 C)] 97.7 F (36.5 C) (09/02 0852) Pulse Rate:  [70-89] 89 (09/02 0852) Resp:  [15-19] 16 (09/02 0852) BP: (138-210)/(92-160) 145/92 (09/02 0852) SpO2:  [96 %-99 %] 96 % (09/02 0852) Weight:  [76.5 kg-80.7 kg] 80.7 kg (09/02 0500)  Weight change: -2.1 kg Filed Weights   02/01/24 0443 02/01/24 1710 02/02/24 0500  Weight: 78.6 kg 76.5 kg 80.7 kg    Intake/Output: I/O last 3 completed shifts: In: 580 [P.O.:580] Out: 2400 [Urine:400; Other:2000]   Intake/Output this shift:  Total I/O In: 360 [P.O.:360] Out: -   Physical Exam: General: No acute distress  Head: Normocephalic, atraumatic. Moist oral mucosal membranes  Neck: Supple  Lungs:  Clear to auscultation, normal effort  Heart: S1S2 no rubs  Abdomen:  Soft, nontender, bowel sounds present  Extremities: No peripheral edema.  Neurologic: Awake, alert, following commands  Skin: No acute rash   Basic Metabolic Panel: Recent Labs  Lab 01/31/24 1101 02/01/24 0351  NA 141 139  K 4.1 4.3  CL 101 101  CO2 26 24  GLUCOSE 97 95  BUN 67* 78*  CREATININE 7.61* 8.42*  CALCIUM  8.5* 8.5*  MG  --  2.4  PHOS  --  4.6    Liver Function Tests: Recent Labs  Lab 01/31/24 1101  AST 18  ALT 12  ALKPHOS 351*  BILITOT 0.7  PROT 7.6  ALBUMIN 3.6   No results for input(s): LIPASE, AMYLASE in the last 168  hours. No results for input(s): AMMONIA in the last 168 hours.  CBC: Recent Labs  Lab 01/31/24 1101 02/01/24 0351  WBC 5.2 5.0  NEUTROABS 3.4  --   HGB 13.1 12.6  HCT 40.0 38.5  MCV 81.8 81.1  PLT 145* 158    Cardiac Enzymes: No results for input(s): CKTOTAL, CKMB, CKMBINDEX, TROPONINI in the last 168 hours.  BNP: Invalid input(s): POCBNP  CBG: Recent Labs  Lab 01/31/24 1307  GLUCAP 81    Microbiology: Results for orders placed or performed during the hospital encounter of 01/31/24  MRSA Next Gen by PCR, Nasal     Status: Abnormal   Collection Time: 01/31/24  1:13 PM   Specimen: Nasal Mucosa; Nasal Swab  Result Value Ref Range Status   MRSA by PCR Next Gen DETECTED (A) NOT DETECTED Final    Comment: CRITICAL RESULT CALLED TO, READ BACK BY AND VERIFIED WITH: KATHERINE CLAYTON 01/31/24 1517 SLM (NOTE) The GeneXpert MRSA Assay (FDA approved for NASAL specimens only), is one component of a comprehensive MRSA colonization surveillance program. It is not intended to diagnose MRSA infection nor to guide or monitor treatment for MRSA infections. Test performance is not FDA approved in patients less than 46 years old. Performed at [facility], [address], [place], [place] [address]     Coagulation Studies: Recent Labs    01/31/24 1101  LABPROT 14.7  INR 1.1  Urinalysis: No results for input(s): COLORURINE, LABSPEC, PHURINE, GLUCOSEU, HGBUR, BILIRUBINUR, KETONESUR, PROTEINUR, UROBILINOGEN, NITRITE, LEUKOCYTESUR in the last 72 hours.  Invalid input(s): APPERANCEUR    Imaging: MR BRAIN WO CONTRAST Result Date: 02/01/2024 CLINICAL DATA:  53 year old female with acute severe headache 0830 hours, code stroke presentation yesterday. Dialysis patient. EXAM: MRI HEAD WITHOUT CONTRAST TECHNIQUE: Multiplanar, multiecho pulse sequences of the brain and surrounding structures were obtained without intravenous contrast.  COMPARISON:  CT head, CTA head and neck yesterday. Brain MRI 01/22/2024, and earlier. FINDINGS: Brain: Extensive chronic microhemorrhages concentrated in the bilateral deep gray nuclei, brainstem, cerebellum. Clustered chronic microhemorrhages also in the right mesial temporal lobe. Rare cerebral hemisphere involvement elsewhere. Associated chronic lacunar infarcts in the bilateral deep gray nuclei, bilateral corona radiata, left and central pons, left cerebellum. Susceptibility artifact on DWI. No restricted diffusion or evidence of acute infarction. Elnor and white matter signal appears stable since 01/22/2024. Small area of chronic cortical encephalomalacia at the junction of the posterior right temporal and right lateral occipital lobes. No midline shift, mass effect, evidence of mass lesion, ventriculomegaly, extra-axial collection or acute intracranial hemorrhage. Cervicomedullary junction and pituitary are within normal limits. Vascular: Major intracranial vascular flow voids are stable. Skull and upper cervical spine: Stable visible heterogeneous bone marrow signal from May this year. More normal T1 marrow signal demonstrated in May of 2024. this may be renal osteodystrophy related. Otherwise negative visible cervical spine. Sinuses/Orbits: Stable and negative. Other: Stable right mastoid effusion. Negative visible scalp and face. IMPRESSION: 1. No acute intracranial abnormality. 2. Chronic severe small vessel disease stable from MRI last month. 3. Altered background bone marrow signal from 2024, favor Renal Osteodystrophy. Electronically Signed   By: VEAR Hurst M.D.   On: 02/01/2024 11:54     Medications:      amLODipine   10 mg Oral Daily   atorvastatin   40 mg Oral Daily   Chlorhexidine  Gluconate Cloth  6 each Topical Daily   Chlorhexidine  Gluconate Cloth  6 each Topical Q0600   cloNIDine   0.3 mg Transdermal Weekly   diclofenac  Sodium  2 g Topical BID   gabapentin   200 mg Oral TID   hydrALAZINE    50 mg Oral Q8H   irbesartan   300 mg Oral Daily   isosorbide  mononitrate  120 mg Oral QPM   labetalol   200 mg Oral BID   melatonin  5 mg Oral QHS   mupirocin  ointment  1 Application Nasal BID   pantoprazole   40 mg Oral Daily   polyethylene glycol  17 g Oral Daily   sevelamer  carbonate  1,600 mg Oral TID with meals   docusate sodium , fluticasone , heparin  sodium (porcine), heparin  sodium (porcine), ondansetron  (ZOFRAN ) IV  Assessment/ Plan:  53 y.o. female with past medical history of ESRD on HD, hypertension, congestive heart failure, migraine headaches, history of intracranial hemorrhage, cocaine abuse, sickle cell trait who was admitted with hypertensive urgency.  1.  Hypertensive urgency.  Patient came in with systolic blood pressure greater than 200.  She was started on nicardipine  drip with good response.  Patient now restarted on amlodipine , clonidine , irbesartan , isosorbide  mononitrate, and metoprolol .  Irbesartan  was increased to 300 mg daily.   Agree with labetalol  200mg  twice daily. Primary will d/c metoprolol . Blood pressure acceptable for this patient.   2.  End-stage renal disease.  Patient on dialysis at [facility] on Monday/Tuesday/Wednesday/Friday.  Received treatment yesterday, UF 2L achieved. Next treatment scheduled  for Wednesday, if remains admitted.   3.  Anemia of  chronic kidney disease.  Hemoglobin of 12.6.  No immediate need for Epogen .  4.  Secondary hyperparathyroidism.  Calcium  and phos within optimal range. Continue Renvela  1.6 g p.o. 3 times daily.    LOS: 1 Timia Casselman 9/2/202512:03 PM

## 2024-02-02 NOTE — Discharge Summary (Signed)
 Physician Discharge Summary   Patient: Allison Hill MRN: [id number] DOB: 1970-06-14  Admit date:     01/31/2024  Discharge date: 02/02/24  Discharge Physician: Charlie Patterson   PCP: Patient, No Pcp Per   Recommendations at discharge:    Team at rehab one day  Continue dialysis as scheduled  Discharge Diagnoses: Principal Problem:   Hypertensive urgency Active Problems:   Chronic heart failure with preserved ejection fraction (HFpEF) (HCC)   ESRD on dialysis (HCC)   History of stroke   Obesity (BMI 30-39.9)   Elevated serum alkaline phosphatase level  Hospital Course: 53 yo female who presented to [facility] ER on 08/31 via EMS from [facility] with hypertension and a severe headache.  Per ER notes it was reported pts bp 190/112, the facility administered clonidine   and following administration bp decreased to 185/121.  She does have ESRD on hemodialysis (M-Tues-Wed-Fri which are shorter sessions).  She reports she has not missed any HD sessions her last session was on 08/29.  She reports she does not think she has been receiving her antihypertensive medications as prescribed at the facility.     ED Course Upon arrival to the ER pt noted to have a little slurred speech; LLE weakness worse than normal; and left foot numbness.   She does have have a stroke hx with residual left-sided deficits.  Code stroke activated.  CT Head negative for acute intracranial abnormality but ASPECTS 10.  CTA Head/Neck revealed no evidence of significant stenosis, aneurysmal dilatation, or dissection involving the arteries of the head and neck.  Pt deemed not a candidate for TNK due to hx of hemorrhagic infarcts in the thalami, therefore risk outweighed the benefits.  Pts bp remained elevated 200's/100's.  Nephrology and Neurology recommended iv antihypertensive therapy, therefore nicardipine  gtt ordered and goal sbp 170-180.  PCCM team contacted for ICU admission.    Significant ED lab results: BUN  67/creatinine 7.61/calcium  8.5/alk phos 351/platelet count 145.   CTA Head/Neck: No acute intracranial hemorrhage or ischemic change. No evidence of significant stenosis, aneurysmal dilatation, or dissection involving the arteries of the head and neck. Mild tortuosity of the cervical right ICA and left common carotid artery and mid cervical ICA without significant stenosis. CT Head: No acute intracranial abnormality. Aspects 10/10. Remote lacunar infarcts in the basal ganglia and thalami bilaterally, right greater than left. Moderate atrophy and white matter changes, stable.  9/1.  Blood pressure still high.  Nephrology increased Avapro  up to 300 mg.  MRI of the brain negative for acute event.  Patient for dialysis today.  Mri brain negative 9/2.  BP much improved with changing metoprolol  over to labetolol.  Can go back to facility  Assessment and Plan: * Hypertensive urgency Patient never placed on continuous drip and restarted on usual medications.  Nephrology increased Avapro  to 300 mg, hydralazine  added.  Metoprolol  changed over to labetalol .  BP improved today and can go back to facility.  Chronic heart failure with preserved ejection fraction (HFpEF) (HCC) EF 55% with moderate mitral  valve regurgitation.  Dialysis to remove fluid.  Patient euvolemic.  ESRD on dialysis [facility]) Dialysis today  History of stroke Chronic left-sided weakness.  MRI of the brain negative for acute stroke.  Obesity (BMI 30-39.9) BMI 31.52, class I obesity.  Elevated serum alkaline phosphatase level Seems chronically elevated could be Paget's disease of bone         Consultants: nephrology, critical care team initially Procedures performed: dialysis Disposition: long term care Diet  recommendation:  Renal diet DISCHARGE MEDICATION: Allergies as of 02/02/2024       Reactions   Ceftriaxone Shortness Of Breath, Hives   Allergy, hives   Shellfish Allergy Hives   Minoxidil Swelling         Medication List     STOP taking these medications    cloNIDine  0.1 MG tablet Commonly known as: CATAPRES    Compound W One Step 40 % Pads Generic drug: Salicylic Acid   metoprolol  tartrate 50 MG tablet Commonly known as: LOPRESSOR    nitroGLYCERIN  0.4 MG SL tablet Commonly known as: NITROSTAT    valsartan 160 MG tablet Commonly known as: DIOVAN Replaced by: irbesartan  300 MG tablet       TAKE these medications    acetaminophen  500 MG tablet Commonly known as: TYLENOL  Take 500 mg by mouth 3 (three) times daily.   amLODipine  10 MG tablet Commonly known as: NORVASC  Take 1 tablet (10 mg total) by mouth daily.   atorvastatin  40 MG tablet Commonly known as: LIPITOR Take 40 mg by mouth daily.   calcitRIOL  0.25 MCG capsule Commonly known as: ROCALTROL  Take 1 capsule (0.25 mcg total) by mouth 4 (four) times a week. Take one capsule by mouth every Mon, Tue, Wed, Fri with dialysis What changed:  how much to take when to take this additional instructions   calcium  carbonate 500 MG chewable tablet Commonly known as: TUMS - dosed in mg elemental calcium  Chew 2 tablets (400 mg of elemental calcium  total) by mouth 2 (two) times daily.   cinacalcet  60 MG tablet Commonly known as: SENSIPAR  Take 60 mg by mouth daily with supper.   cloNIDine  0.3 mg/24hr patch Commonly known as: CATAPRES  - Dosed in mg/24 hr Place 1 patch onto the skin once a week. On Fridays   epoetin  alfa 4000 UNIT/ML injection Commonly known as: EPOGEN  Inject 1 mL (4,000 Units total) into the vein Every Tuesday,Thursday,and Saturday with dialysis.   fluticasone  50 MCG/ACT nasal spray Commonly known as: FLONASE  Place 1 spray into both nostrils daily as needed for allergies or rhinitis.   gabapentin  100 MG capsule Commonly known as: NEURONTIN  Take 200 mg by mouth 3 (three) times daily.   hydrALAZINE  50 MG tablet Commonly known as: APRESOLINE  Take 1 tablet (50 mg total) by mouth every 8 (eight)  hours.   irbesartan  300 MG tablet Commonly known as: AVAPRO  Take 1 tablet (300 mg total) by mouth daily. Replaces: valsartan 160 MG tablet   isosorbide  mononitrate 120 MG 24 hr tablet Commonly known as: IMDUR  Take 120 mg by mouth every evening.   labetalol  200 MG tablet Commonly known as: NORMODYNE  Take 1 tablet (200 mg total) by mouth 2 (two) times daily.   lidocaine  4 % Place 1 patch onto the skin in the morning. Remove & Discard patch within 12 hours or as directed by MD   melatonin 3 MG Tabs tablet Take 6 mg by mouth at bedtime.   mupirocin  ointment 2 % Commonly known as: BACTROBAN  Place 1 Application into the nose 2 (two) times daily for 5 days.   pantoprazole  40 MG tablet Commonly known as: PROTONIX  Take 1 tablet (40 mg total) by mouth daily.   polyethylene glycol 17 g packet Commonly known as: MIRALAX  / GLYCOLAX  Take 17 g by mouth daily as needed.   senna-docusate 8.6-50 MG tablet Commonly known as: Senokot-S Take 2 tablets by mouth 2 (two) times daily as needed for mild constipation.   sevelamer  carbonate 800 MG tablet Commonly known as:  RENVELA  Take 1,600 mg by mouth 3 (three) times daily.   Voltaren  1 % Gel Generic drug: diclofenac  Sodium Apply 2 g topically 2 (two) times daily. Apply to left shoulder.        Follow-up Information     team at rehab Follow up in 1 day(s).                 Discharge Exam: Filed Weights   02/01/24 0443 02/01/24 1710 02/02/24 0500  Weight: 78.6 kg 76.5 kg 80.7 kg   Physical Exam HENT:     Head: Normocephalic.     Mouth/Throat:     Pharynx: No oropharyngeal exudate.  Eyes:     General: Lids are normal.     Conjunctiva/sclera: Conjunctivae normal.  Cardiovascular:     Rate and Rhythm: Normal rate and regular rhythm.     Heart sounds: S1 normal and S2 normal. Murmur heard.     Systolic murmur is present with a grade of 2/6.  Pulmonary:     Breath sounds: No decreased breath sounds, wheezing, rhonchi or  rales.  Abdominal:     Palpations: Abdomen is soft.     Tenderness: There is no abdominal tenderness.  Musculoskeletal:     Right lower leg: No swelling.     Left lower leg: No swelling.  Skin:    General: Skin is warm.     Findings: No rash.  Neurological:     Mental Status: She is alert and oriented to person, place, and time.      Condition at discharge: stable  The results of significant diagnostics from this hospitalization (including imaging, microbiology, ancillary and laboratory) are listed below for reference.   Imaging Studies: MR BRAIN WO CONTRAST Result Date: 02/01/2024 CLINICAL DATA:  53 year old female with acute severe headache 0830 hours, code stroke presentation yesterday. Dialysis patient. EXAM: MRI HEAD WITHOUT CONTRAST TECHNIQUE: Multiplanar, multiecho pulse sequences of the brain and surrounding structures were obtained without intravenous contrast. COMPARISON:  CT head, CTA head and neck yesterday. Brain MRI 01/22/2024, and earlier. FINDINGS: Brain: Extensive chronic microhemorrhages concentrated in the bilateral deep gray nuclei, brainstem, cerebellum. Clustered chronic microhemorrhages also in the right mesial temporal lobe. Rare cerebral hemisphere involvement elsewhere. Associated chronic lacunar infarcts in the bilateral deep gray nuclei, bilateral corona radiata, left and central pons, left cerebellum. Susceptibility artifact on DWI. No restricted diffusion or evidence of acute infarction. Elnor and white matter signal appears stable since 01/22/2024. Small area of chronic cortical encephalomalacia at the junction of the posterior right temporal and right lateral occipital lobes. No midline shift, mass effect, evidence of mass lesion, ventriculomegaly, extra-axial collection or acute intracranial hemorrhage. Cervicomedullary junction and pituitary are within normal limits. Vascular: Major intracranial vascular flow voids are stable. Skull and upper cervical spine:  Stable visible heterogeneous bone marrow signal from May this year. More normal T1 marrow signal demonstrated in May of 2024. this may be renal osteodystrophy related. Otherwise negative visible cervical spine. Sinuses/Orbits: Stable and negative. Other: Stable right mastoid effusion. Negative visible scalp and face. IMPRESSION: 1. No acute intracranial abnormality. 2. Chronic severe small vessel disease stable from MRI last month. 3. Altered background bone marrow signal from 2024, favor Renal Osteodystrophy. Electronically Signed   By: VEAR Hurst M.D.   On: 02/01/2024 11:54   CT ANGIO HEAD NECK W WO CM W PERF (CODE STROKE) Result Date: 01/31/2024 EXAM: CTA Head and Neck without and with Intravenous Contrast. CT Head without Contrast. CLINICAL HISTORY: Neuro deficit,  acute, stroke suspected. Code stroke. Neuro MD: Camellia Shark [phone]; Pt getting dialysis tomorrow. TECHNIQUE: Axial CTA images of the head and neck performed with intravenous contrast. MIP reconstructed images were created and reviewed. Axial computed tomography images of the head/brain performed without intravenous contrast. Note: Per PQRS, the description of internal carotid artery percent stenosis, including 0 percent or normal exam, is based on [place] American Symptomatic Carotid Endarterectomy Trial (NASCET) criteria. Dose reduction technique was used including one or more of the following: automated exposure control, adjustment of mA and kV according to patient size, and/or iterative reconstruction. CONTRAST: Without and with; 100 mL iohexol  (Omnipaque ) 350 mg/mL injection. COMPARISON: CT of the head without contrast 01/31/2024. CT angio head and neck 01/21/2024. FINDINGS: CT HEAD: BRAIN: No acute intraparenchymal hemorrhage. No mass lesion. No CT evidence for acute territorial infarct. No midline shift or extra-axial collection. VENTRICLES: No hydrocephalus. ORBITS: The orbits are unremarkable. SINUSES AND MASTOIDS: The paranasal sinuses  and mastoid air cells are clear. CTA NECK: COMMON CAROTID ARTERIES: No significant stenosis. No dissection or occlusion. Atherosclerotic calcifications are present at the aortic arch and proximal left subclavian artery without significant stenoses or aneurysm. No dissection is present. INTERNAL CAROTID ARTERIES: No stenosis by NASCET criteria. No dissection or occlusion. Minimal calcification is present at the right carotid bifurcation without significant stenosis. Mild tortuosity of the cervical right ICA is again noted. Tortuosity is present in the left common carotid artery and mid cervical ICA without significant stenosis. VERTEBRAL ARTERIES: No significant stenosis. No dissection or occlusion. CTA HEAD: ANTERIOR CEREBRAL ARTERIES: No significant stenosis. No occlusion. No aneurysm. MIDDLE CEREBRAL ARTERIES: No significant stenosis. No occlusion. No aneurysm. POSTERIOR CEREBRAL ARTERIES: No significant stenosis. No occlusion. No aneurysm. BASILAR ARTERY: No significant stenosis. No occlusion. No aneurysm. OTHER: Atherosclerotic calcifications are again noted within the cavernous internal carotid arteries bilaterally without significant stenoses through the ICA terminus. SOFT TISSUES: No acute finding. No masses or lymphadenopathy. A right IJ catheter is stable. BONES: No acute osseous abnormality. IMPRESSION: 1. No acute intracranial hemorrhage or ischemic change. 2. No evidence of significant stenosis, aneurysmal dilatation, or dissection involving the arteries of the head and neck. 3. Mild tortuosity of the cervical right ICA and left common carotid artery and mid cervical ICA without significant stenosis. THE PERTINENT RESULTS WERE TEXTED TO DR. LINDZEN VIA THE [facility] SYSTEM AT 11:54 AM. Electronically signed by: Lonni Necessary MD 01/31/2024 11:59 AM EDT RP Workstation: [id number]   CT HEAD CODE STROKE WO CONTRAST` Result Date: 01/31/2024 EXAM: CT HEAD WITHOUT 01/31/2024 11:34:02 AM TECHNIQUE: CT of  the head was performed without the administration of intravenous contrast. Automated exposure control, iterative reconstruction, and/or weight based adjustment of the mA/kV was utilized to reduce the radiation dose to as low as reasonably achievable. COMPARISON: MR head without contrast 01/22/2024. CT head and CT angio head and neck 01/21/2024. CLINICAL HISTORY: Headache, sudden, severe. FINDINGS: BRAIN AND VENTRICLES: No acute intracranial hemorrhage. No mass effect or midline shift. No extra-axial fluid collection. No evidence of acute infarct. No hydrocephalus. Remote lacunar infarcts are again noted in the basal ganglia and thalami bilaterally, right greater than left. Moderate atrophy and white matter changes are stable. [place] stroke program early CT (ASPECT) score ----- Ganglionic (caudate, IC, lentiform nucleus, insula, M1-M3): 7 Supraganglionic (M4-M6): 3 Total: 10 ORBITS: No acute abnormality. SINUSES AND MASTOIDS: No acute abnormality. SOFT TISSUES AND SKULL: No acute skull fracture. No acute soft tissue abnormality. IMPRESSION: 1. No acute intracranial abnormality. Aspects 10/10 2. Remote  lacunar infarcts in the basal ganglia and thalami bilaterally, right greater than left. 3. Moderate atrophy and white matter changes, stable. The pertinent results were texted to Dr. Lindzen via the [facility] system at 11:25 am. Electronically signed by: Lonni Necessary MD 01/31/2024 11:42 AM EDT RP Workstation: [id number]   CT Angio Chest/Abd/Pel for Dissection W and/or Wo Contrast Result Date: 01/24/2024 CLINICAL DATA:  Renal failure patient on dialysis with hypertension and worsening back pain. EXAM: CT ANGIOGRAPHY CHEST, ABDOMEN AND PELVIS TECHNIQUE: Non-contrast CT of the chest was initially obtained. Multidetector CT imaging through the chest, abdomen and pelvis was performed using the standard protocol during bolus administration of intravenous contrast. Multiplanar reconstructed images and MIPs were obtained  and reviewed to evaluate the vascular anatomy. RADIATION DOSE REDUCTION: This exam was performed according to the departmental dose-optimization program which includes automated exposure control, adjustment of the mA and/or kV according to patient size and/or use of iterative reconstruction technique. CONTRAST:  OMNIPAQUE  IOHEXOL  350 MG/ML SOLN COMPARISON:  Last chest x-ray was portable chest 12/06/2023. Comparison is made with CTA chest, abdomen and pelvis dated 12/18/2023 and CTA chest 11/27/2023. FINDINGS: CTA CHEST FINDINGS Cardiovascular: Right IJ dialysis catheter again terminates at the superior cavoatrial junction. Stable moderate cardiomegaly with left ventricular and septal wall hypertrophy and left chamber predominance. Stable central venous distention. There is no pericardial effusion. There scattered 2 vessel calcific plaques in the LAD and circumflex coronary arteries. There is about equal opacification of the aorta and pulmonary arteries. Pulmonary trunk slightly prominent 3.1 cm as before without evidence of arterial embolism. There are calcific plaques in the aortic isthmus and proximal descending segment, calcific plaque in the ascending aorta but no aortic or great vessel aneurysm, stenosis or dissection. Mediastinum/Nodes: There is more than the typical amount of substernal thymus for a 53 year old, but this was seen previously and unchanged. There is no intrathoracic or axillary adenopathy. The lower poles of the thyroid gland, thoracic trachea, main bronchi, thoracic esophagus are unremarkable. 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 Lungs/Pleura: Chronic scarring medial basal left lower lobe. Mild posterior atelectasis. No consolidation, effusion or visible nodules. Central airways are patent. Musculoskeletal: Dense bones consistent with renal osteodystrophy. No  acute or other significant osseous findings. The ribcage is intact. Review of the MIP images confirms the above findings. CTA ABDOMEN AND PELVIS FINDINGS VASCULAR Aorta: Normal caliber aorta without aneurysm, dissection, vasculitis or significant stenosis. There is mild-to-moderate patchy calcific plaque. Celiac: Patent without evidence of aneurysm, dissection, vasculitis or significant stenosis. There are mild nonstenosing calcific plaques. SMA: Patent without evidence of aneurysm, dissection, vasculitis or significant stenosis. Renals: Both are single with mild calcific plaque proximally. Both are widely patent. IMA: Patent without evidence of aneurysm, dissection, vasculitis or significant stenosis. Inflow: Patent without evidence of aneurysm, dissection, vasculitis or significant stenosis. There are patchy nonstenosing calcifications in the common iliac, internal iliac and both distal external iliac arteries Veins: No obvious venous abnormality within the limitations of this arterial phase study. Review of the MIP images confirms the above findings. NON-VASCULAR Hepatobiliary: There is a 7 mm focal hyperenhancement in the dome of segment 8, most likely a flash filling hemangioma, seen previously. Otherwise no focal liver abnormality is seen. No gallstones, gallbladder wall thickening, or biliary dilatation. Pancreas: No abnormality. Spleen: No abnormality. Adrenals/Urinary Tract: There are multiple small subcentimeter Bosniak 2 renal cortical cysts which are too small  to characterize. No follow-up imaging recommended. There is no mass enhancement in the kidneys. There is slight nodular thickening in the adrenal glands, stable. Both kidneys are small, showing moderate symmetric volume loss. There is no urinary stone or obstruction. There is contrast in the bladder. The bladder is unremarkable for the degree of distension. Stomach/Bowel: Chronic thickened folds in the stomach. Unremarkable unopacified small bowel.  Normal retrocecal appendix. Moderate diffuse fecal stasis. Scattered diverticulosis without diverticulitis. Lymphatic: No adenopathy is seen. Reproductive: Uterus and bilateral adnexa are unremarkable. Other: There is presacral edema not seen previously, at the level of the common iliac and proximal to mid internal iliac vessels, which could be seen with veno-occlusive disease such as thrombophlebitis, otherwise would be in keeping with a nonspecific inflammatory process. There is trace ascites in the posterior pelvis. No free hemorrhage or free air or abscess. No incarcerated hernia. Musculoskeletal: Dense bones. No acute or other significant osseous findings. Lumbar facet hypertrophy. Review of the MIP images confirms the above findings. IMPRESSION: 1. Aortic and coronary artery atherosclerosis. No aortic aneurysm, dissection or stenosis. 2. Cardiomegaly with left ventricular and septal wall hypertrophy and left chamber predominance. 3. Slightly prominent pulmonary trunk but no findings of acute right heart strain. No arterial embolism is seen. 4. 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 5. Chronic thickened folds in the stomach. 6. Constipation and diverticulosis. 7. Presacral edema not seen previously, which could be seen with veno-occlusive disease such as thrombophlebitis, otherwise would be in keeping with a nonspecific inflammatory process. 8. Trace ascites in the posterior pelvis. 9. Renal osteodystrophy. Aortic Atherosclerosis (ICD10-I70.0). Electronically Signed   By: Francis Quam M.D.   On: 01/24/2024 02:46   MR BRAIN WO CONTRAST Result Date: 01/22/2024 CLINICAL DATA:  Neuro deficit, acute, stroke suspected EXAM: MRI HEAD WITHOUT CONTRAST TECHNIQUE: Multiplanar, multiecho pulse sequences of the brain and surrounding structures were obtained without intravenous contrast. COMPARISON:  MRI head 12/18/2023. FINDINGS: Brain: No acute infarction,  acute hemorrhage, hydrocephalus, extra-axial collection or mass lesion. 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 Many small foci of susceptibility artifact predominantly throughout the basal ganglia, thalami, brainstem and cerebellum bilaterally, compatible with chronic microhemorrhages. Vascular: Major arterial flow voids are maintained at the skull base. Skull and upper cervical spine: Normal marrow signal. Sinuses/Orbits: Clear sinuses.  No acute orbital findings. Other: Right mastoid effusion. IMPRESSION: 1. No evidence of acute intracranial abnormality. 2. 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 3. Many chronic microhemorrhages in the basal ganglia, thalami, brainstem and cerebellum, likely the sequela of chronic hypertension. Electronically Signed   By: Gilmore GORMAN Molt M.D.   On: 01/22/2024 02:42   CT ANGIO HEAD NECK W WO CM (CODE STROKE) Result Date: 01/21/2024 CLINICAL DATA:  Left sided weakness/numbness EXAM: CT ANGIOGRAPHY HEAD AND NECK WITH AND WITHOUT CONTRAST TECHNIQUE: Multidetector CT imaging of the head and neck was performed using the standard protocol during bolus administration of intravenous contrast. Multiplanar CT image reconstructions and MIPs were obtained to evaluate the vascular anatomy. Carotid stenosis measurements (when applicable) are obtained utilizing NASCET criteria, using the distal internal carotid diameter as the denominator. RADIATION DOSE REDUCTION: This exam was performed according to the departmental dose-optimization program which includes automated exposure control, adjustment of the mA and/or kV according to patient size and/or use of iterative reconstruction technique. CONTRAST:  75mL OMNIPAQUE  IOHEXOL  350 MG/ML SOLN COMPARISON:  Same day CT head.  CTA head and neck 12/18/2023 FINDINGS: CTA NECK FINDINGS Aortic  arch: Great vessel origins are patent without significant stenosis. Right carotid system: No evidence of dissection, stenosis  (50% or greater), or occlusion. Left carotid system: No evidence of dissection, stenosis (50% or greater), or occlusion. Vertebral arteries: Left dominant. No evidence of dissection, stenosis (50% or greater), or occlusion. Skeleton: No acute abnormality. Other neck: No acute abnormality. Upper chest: No acute abnormality. Review of the MIP images confirms the above findings CTA HEAD FINDINGS Anterior circulation: Bilateral intracranial ICAs, MCAs, and ACAs are patent without proximal hemodynamically significant stenosis. Posterior circulation: Bilateral intradural vertebral arteries, basilar artery and bilateral posterior cerebral arteries are patent without proximal hemodynamically significant stenosis. Venous sinuses: As permitted by contrast timing, patent. Review of the MIP images confirms the above findings IMPRESSION: No emergent large vessel occlusion or proximal hemodynamically significant stenosis. Electronically Signed   By: Gilmore GORMAN Molt M.D.   On: 01/21/2024 22:30   CT HEAD CODE STROKE WO CONTRAST Result Date: 01/21/2024 CLINICAL DATA:  Code stroke.  Neuro deficit, acute, stroke suspected EXAM: CT HEAD WITHOUT CONTRAST TECHNIQUE: Contiguous axial images were obtained from the base of the skull through the vertex without intravenous contrast. RADIATION DOSE REDUCTION: This exam was performed according to the departmental dose-optimization program which includes automated exposure control, adjustment of the mA and/or kV according to patient size and/or use of iterative reconstruction technique. COMPARISON:  CT head 12/18/2023. FINDINGS: Brain: No evidence of acute large vascular territory infarction, hemorrhage, hydrocephalus, extra-axial collection or mass lesion/mass effect. Similar patchy white matter hypodensities. Vascular: Hyperdense appearance of the right M2 MCA (for example see series 6, image 12). Skull: No acute fracture. Sinuses/Orbits: Clear sinuses. ASPECTS [facility] Stroke Program  Early CT Score) Total score (0-10 with 10 being normal): 10. IMPRESSION: 1. Hyperdense appearance of a right M2 MCA vessel, possibly thrombosed. Recommend stat CTA of the head/neck to further evaluate. 2. No convincing evidence of acute large vascular territory infarct. 3. No acute hemorrhage. Findings cos with Dr. Jossie via telephone at 9:56 PM. Electronically Signed   By: Gilmore GORMAN Molt M.D.   On: 01/21/2024 22:00    Microbiology: Results for orders placed or performed during the hospital encounter of 01/31/24  MRSA Next Gen by PCR, Nasal     Status: Abnormal   Collection Time: 01/31/24  1:13 PM   Specimen: Nasal Mucosa; Nasal Swab  Result Value Ref Range Status   MRSA by PCR Next Gen DETECTED (A) NOT DETECTED Final    Comment: CRITICAL RESULT CALLED TO, READ BACK BY AND VERIFIED WITH: KATHERINE CLAYTON 01/31/24 1517 SLM (NOTE) The GeneXpert MRSA Assay (FDA approved for NASAL specimens only), is one component of a comprehensive MRSA colonization surveillance program. It is not intended to diagnose MRSA infection nor to guide or monitor treatment for MRSA infections. Test performance is not FDA approved in patients less than 98 years old. Performed at [facility], [address] [place], [place] [address]     Labs: CBC: Recent Labs  Lab 01/31/24 1101 02/01/24 0351  WBC 5.2 5.0  NEUTROABS 3.4  --   HGB 13.1 12.6  HCT 40.0 38.5  MCV 81.8 81.1  PLT 145* 158   Basic Metabolic Panel: Recent Labs  Lab 01/31/24 1101 02/01/24 0351  NA 141 139  K 4.1 4.3  CL 101 101  CO2 26 24  GLUCOSE 97 95  BUN 67* 78*  CREATININE 7.61* 8.42*  CALCIUM  8.5* 8.5*  MG  --  2.4  PHOS  --  4.6  Liver Function Tests: Recent Labs  Lab 01/31/24 1101  AST 18  ALT 12  ALKPHOS 351*  BILITOT 0.7  PROT 7.6  ALBUMIN 3.6   CBG: Recent Labs  Lab 01/31/24 1307  GLUCAP 81    Discharge time spent: greater than 30 minutes.  Signed: Charlie Patterson, MD [facility] 02/02/2024

## 2024-02-09 ENCOUNTER — Other Ambulatory Visit (INDEPENDENT_AMBULATORY_CARE_PROVIDER_SITE_OTHER): Payer: Self-pay | Admitting: Vascular Surgery

## 2024-02-09 DIAGNOSIS — N186 End stage renal disease: Secondary | ICD-10-CM

## 2024-02-11 ENCOUNTER — Encounter (INDEPENDENT_AMBULATORY_CARE_PROVIDER_SITE_OTHER): Payer: Self-pay | Admitting: Vascular Surgery

## 2024-02-11 ENCOUNTER — Ambulatory Visit (INDEPENDENT_AMBULATORY_CARE_PROVIDER_SITE_OTHER): Payer: Self-pay | Admitting: Vascular Surgery

## 2024-02-11 ENCOUNTER — Ambulatory Visit (INDEPENDENT_AMBULATORY_CARE_PROVIDER_SITE_OTHER)

## 2024-02-11 VITALS — BP 151/95 | HR 81 | Ht 63.0 in | Wt 171.4 lb

## 2024-02-11 DIAGNOSIS — N186 End stage renal disease: Secondary | ICD-10-CM | POA: Diagnosis not present

## 2024-02-11 DIAGNOSIS — I10 Essential (primary) hypertension: Secondary | ICD-10-CM

## 2024-02-11 DIAGNOSIS — Z992 Dependence on renal dialysis: Secondary | ICD-10-CM

## 2024-02-14 ENCOUNTER — Encounter (INDEPENDENT_AMBULATORY_CARE_PROVIDER_SITE_OTHER): Payer: Self-pay | Admitting: Vascular Surgery

## 2024-02-14 NOTE — Progress Notes (Signed)
 MRN : [id number]  Allison Hill is a 53 y.o. (03-27-1971) female who presents with chief complaint of check access.  History of Present Illness:    The patient is seen for evaluation of dialysis access.  The patient has a history of multiple failed accesses.  There have been accesses in both arms which are nonfunctioning.    Current access is via a catheter which is functioning poorly the flow rates have been less than ideal.  There have not been multiple episodes of catheter infection.  The patient denies fever and chills while on dialysis.  No tenderness or drainage at the exit site.  No recent shortening of the patient's walking distance or new symptoms consistent with claudication.  No history of rest pain symptoms. No new ulcers or wounds of the lower extremities have occurred.  The patient denies amaurosis fugax or recent TIA symptoms. There are no recent neurological changes noted. There is no history of DVT, PE or superficial thrombophlebitis. No recent episodes of angina or shortness of breath documented.   Vein mapping demonstrates small bilateral superficial veins inadequate for fistula creation.  She does have triphasic arterial signals bilaterally.    Current Meds  Medication Sig   acetaminophen  (TYLENOL ) 500 MG tablet Take 500 mg by mouth 3 (three) times daily.   amLODipine  (NORVASC ) 10 MG tablet Take 1 tablet (10 mg total) by mouth daily.   atorvastatin  (LIPITOR) 40 MG tablet Take 40 mg by mouth daily.   calcitRIOL  (ROCALTROL ) 0.25 MCG capsule Take 1 capsule (0.25 mcg total) by mouth 4 (four) times a week. Take one capsule by mouth every Mon, Tue, Wed, Fri with dialysis (Patient taking differently: Take 0.75 mcg by mouth every Monday, Tuesday, Wednesday, Thursday, and Friday. Administered at dialysis every Mon, Tue, Wed, Thurs and Fri.)   calcium  carbonate (TUMS - DOSED IN MG ELEMENTAL CALCIUM ) 500 MG chewable tablet Chew 2 tablets (400 mg of  elemental calcium  total) by mouth 2 (two) times daily.   cinacalcet  (SENSIPAR ) 60 MG tablet Take 60 mg by mouth daily with supper.   cloNIDine  (CATAPRES  - DOSED IN MG/24 HR) 0.3 mg/24hr patch Place 1 patch onto the skin once a week. On Fridays   diclofenac  Sodium (VOLTAREN ) 1 % GEL Apply 2 g topically 2 (two) times daily. Apply to left shoulder.   epoetin  alfa (EPOGEN ) 4000 UNIT/ML injection Inject 1 mL (4,000 Units total) into the vein Every Tuesday,Thursday,and Saturday with dialysis.   fluticasone  (FLONASE ) 50 MCG/ACT nasal spray Place 1 spray into both nostrils daily as needed for allergies or rhinitis.   gabapentin  (NEURONTIN ) 100 MG capsule Take 200 mg by mouth 3 (three) times daily.   hydrALAZINE  (APRESOLINE ) 50 MG tablet Take 1 tablet (50 mg total) by mouth every 8 (eight) hours.   irbesartan  (AVAPRO ) 300 MG tablet Take 1 tablet (300 mg total) by mouth daily.   isosorbide  mononitrate (IMDUR ) 120 MG 24 hr tablet Take 120 mg by mouth every evening.   labetalol  (NORMODYNE ) 200 MG tablet Take 1 tablet (200 mg total) by mouth 2 (two) times daily.   lidocaine  4 % Place 1 patch onto the skin in the morning. Remove & Discard patch within 12 hours or as directed by MD   melatonin 3 MG TABS tablet Take 6 mg by mouth at bedtime.   pantoprazole  (PROTONIX ) 40 MG tablet Take 1 tablet (40 mg  total) by mouth daily.   polyethylene glycol (MIRALAX  / GLYCOLAX ) 17 g packet Take 17 g by mouth daily as needed.   senna-docusate (SENOKOT-S) 8.6-50 MG tablet Take 2 tablets by mouth 2 (two) times daily as needed for mild constipation.   sevelamer  carbonate (RENVELA ) 800 MG tablet Take 1,600 mg by mouth 3 (three) times daily.    Past Medical History:  Diagnosis Date   CHF (congestive heart failure) (HCC)    Hypertension    Renal disorder    ESRD on dialysis   Stroke [facility])    L hemiparesis, 2015, 2023    History reviewed. No pertinent surgical history.  Social History Social History   Tobacco Use    Smoking status: Former    Types: Cigarettes   Smokeless tobacco: Never  Vaping Use   Vaping status: Never Used  Substance Use Topics   Alcohol use: Not Currently   Drug use: Yes    Types: Codeine    Family History Family History  Problem Relation Age of Onset   Heart failure Mother    CAD Brother     Allergies  Allergen Reactions   Ceftriaxone Shortness Of Breath and Hives    Allergy, hives   Shellfish Allergy Hives   Minoxidil Swelling     REVIEW OF SYSTEMS (Negative unless checked)  Constitutional: [] Weight loss  [] Fever  [] Chills Cardiac: [] Chest pain   [] Chest pressure   [] Palpitations   [] Shortness of breath when laying flat   [] Shortness of breath with exertion. Vascular:  [] Pain in legs with walking   [] Pain in legs at rest  [] History of DVT   [] Phlebitis   [] Swelling in legs   [] Varicose veins   [] Non-healing ulcers Pulmonary:   [] Uses home oxygen   [] Productive cough   [] Hemoptysis   [] Wheeze  [] COPD   [] Asthma Neurologic:  [] Dizziness   [] Seizures   [] History of stroke   [] History of TIA  [] Aphasia   [] Vissual changes   [] Weakness or numbness in arm   [] Weakness or numbness in leg Musculoskeletal:   [] Joint swelling   [] Joint pain   [] Low back pain Hematologic:  [] Easy bruising  [] Easy bleeding   [] Hypercoagulable state   [] Anemic Gastrointestinal:  [] Diarrhea   [] Vomiting  [] Gastroesophageal reflux/heartburn   [] Difficulty swallowing. Genitourinary:  [x] Chronic kidney disease   [] Difficult urination  [] Frequent urination   [] Blood in urine Skin:  [] Rashes   [] Ulcers  Psychological:  [] History of anxiety   []  History of major depression.  Physical Examination  Vitals:   02/11/24 1431  BP: (!) 151/95  Pulse: 81  Weight: 171 lb 6.4 oz (77.7 kg)  Height: 5' 3 (1.6 m)   Body mass index is 30.36 kg/m. Gen: WD/WN, NAD Head: [place]/AT, No temporalis wasting.  Ear/Nose/Throat: Hearing grossly intact, nares w/o erythema or drainage Eyes: PER, EOMI, sclera  nonicteric.  Neck: Supple, no gross masses or lesions.  No JVD.  Pulmonary:  Good air movement, no audible wheezing, no use of accessory muscles.  Cardiac: RRR, precordium non-hyperdynamic. Vascular:   Right IJ tunnel catheter clean dry and intact Vessel Right Left  Radial Palpable Palpable  Brachial Palpable Palpable  Gastrointestinal: soft, non-distended. No guarding/no peritoneal signs.  Musculoskeletal: M/S 5/5 throughout.  No deformity.  Neurologic: CN 2-12 intact. Pain and light touch intact in extremities.  Symmetrical.  Speech is fluent. Motor exam as listed above. Psychiatric: Judgment intact, Mood & affect appropriate for pt's clinical situation. Dermatologic: No rashes or ulcers noted.  No changes consistent with cellulitis.   CBC Lab Results  Component Value Date   WBC 5.0 02/01/2024   HGB 12.6 02/01/2024   HCT 38.5 02/01/2024   MCV 81.1 02/01/2024   PLT 158 02/01/2024    BMET    Component Value Date/Time   NA 139 02/01/2024 0351   K 4.3 02/01/2024 0351   CL 101 02/01/2024 0351   CO2 24 02/01/2024 0351   GLUCOSE 95 02/01/2024 0351   BUN 78 (H) 02/01/2024 0351   CREATININE 8.42 (H) 02/01/2024 0351   CALCIUM  8.5 (L) 02/01/2024 0351   GFRNONAA 5 (L) 02/01/2024 0351   GFRAA  05/17/2007 2046    >60        The eGFR has been calculated using the MDRD equation. This calculation has not been validated in all clinical   Estimated Creatinine Clearance: 7.7 mL/min (A) (by C-G formula based on SCr of 8.42 mg/dL (H)).  COAG Lab Results  Component Value Date   INR 1.1 01/31/2024   INR 1.0 01/21/2024   INR 1.0 12/18/2023    Radiology MR BRAIN WO CONTRAST Result Date: 02/01/2024 CLINICAL DATA:  53 year old female with acute severe headache 0830 hours, code stroke presentation yesterday. Dialysis patient. EXAM: MRI HEAD WITHOUT CONTRAST TECHNIQUE: Multiplanar, multiecho pulse sequences of the brain and surrounding structures were obtained without intravenous contrast.  COMPARISON:  CT head, CTA head and neck yesterday. Brain MRI 01/22/2024, and earlier. FINDINGS: Brain: Extensive chronic microhemorrhages concentrated in the bilateral deep gray nuclei, brainstem, cerebellum. Clustered chronic microhemorrhages also in the right mesial temporal lobe. Rare cerebral hemisphere involvement elsewhere. Associated chronic lacunar infarcts in the bilateral deep gray nuclei, bilateral corona radiata, left and central pons, left cerebellum. Susceptibility artifact on DWI. No restricted diffusion or evidence of acute infarction. [place] and white matter signal appears stable since 01/22/2024. Small area of chronic cortical encephalomalacia at the junction of the posterior right temporal and right lateral occipital lobes. No midline shift, mass effect, evidence of mass lesion, ventriculomegaly, extra-axial collection or acute intracranial hemorrhage. Cervicomedullary junction and pituitary are within normal limits. Vascular: Major intracranial vascular flow voids are stable. Skull and upper cervical spine: Stable visible heterogeneous bone marrow signal from May this year. More normal T1 marrow signal demonstrated in May of 2024. this may be renal osteodystrophy related. Otherwise negative visible cervical spine. Sinuses/Orbits: Stable and negative. Other: Stable right mastoid effusion. Negative visible scalp and face. IMPRESSION: 1. No acute intracranial abnormality. 2. Chronic severe small vessel disease stable from MRI last month. 3. Altered background bone marrow signal from 2024, favor Renal Osteodystrophy. Electronically Signed   By: VEAR Hurst M.D.   On: 02/01/2024 11:54   CT ANGIO HEAD NECK W WO CM W PERF (CODE STROKE) Result Date: 01/31/2024 EXAM: CTA Head and Neck without and with Intravenous Contrast. CT Head without Contrast. CLINICAL HISTORY: Neuro deficit, acute, stroke suspected. Code stroke. Neuro MD: Camellia Shark [phone]; Pt getting dialysis tomorrow. TECHNIQUE: Axial CTA  images of the head and neck performed with intravenous contrast. MIP reconstructed images were created and reviewed. Axial computed tomography images of the head/brain performed without intravenous contrast. Note: Per PQRS, the description of internal carotid artery percent stenosis, including 0 percent or normal exam, is based on [place] American Symptomatic Carotid Endarterectomy Trial (NASCET) criteria. Dose reduction technique was used including one or more of the following: automated exposure control, adjustment of mA and kV according to patient size, and/or iterative reconstruction. CONTRAST: Without and with; 100 mL  iohexol  (Omnipaque ) 350 mg/mL injection. COMPARISON: CT of the head without contrast 01/31/2024. CT angio head and neck 01/21/2024. FINDINGS: CT HEAD: BRAIN: No acute intraparenchymal hemorrhage. No mass lesion. No CT evidence for acute territorial infarct. No midline shift or extra-axial collection. VENTRICLES: No hydrocephalus. ORBITS: The orbits are unremarkable. SINUSES AND MASTOIDS: The paranasal sinuses and mastoid air cells are clear. CTA NECK: COMMON CAROTID ARTERIES: No significant stenosis. No dissection or occlusion. Atherosclerotic calcifications are present at the aortic arch and proximal left subclavian artery without significant stenoses or aneurysm. No dissection is present. INTERNAL CAROTID ARTERIES: No stenosis by NASCET criteria. No dissection or occlusion. Minimal calcification is present at the right carotid bifurcation without significant stenosis. Mild tortuosity of the cervical right ICA is again noted. Tortuosity is present in the left common carotid artery and mid cervical ICA without significant stenosis. VERTEBRAL ARTERIES: No significant stenosis. No dissection or occlusion. CTA HEAD: ANTERIOR CEREBRAL ARTERIES: No significant stenosis. No occlusion. No aneurysm. MIDDLE CEREBRAL ARTERIES: No significant stenosis. No occlusion. No aneurysm. POSTERIOR CEREBRAL ARTERIES: No  significant stenosis. No occlusion. No aneurysm. BASILAR ARTERY: No significant stenosis. No occlusion. No aneurysm. OTHER: Atherosclerotic calcifications are again noted within the cavernous internal carotid arteries bilaterally without significant stenoses through the ICA terminus. SOFT TISSUES: No acute finding. No masses or lymphadenopathy. A right IJ catheter is stable. BONES: No acute osseous abnormality. IMPRESSION: 1. No acute intracranial hemorrhage or ischemic change. 2. No evidence of significant stenosis, aneurysmal dilatation, or dissection involving the arteries of the head and neck. 3. Mild tortuosity of the cervical right ICA and left common carotid artery and mid cervical ICA without significant stenosis. THE PERTINENT RESULTS WERE TEXTED TO DR. LINDZEN VIA THE [facility] SYSTEM AT 11:54 AM. Electronically signed by: Lonni Necessary MD 01/31/2024 11:59 AM EDT RP Workstation: [id number]   CT HEAD CODE STROKE WO CONTRAST` Result Date: 01/31/2024 EXAM: CT HEAD WITHOUT 01/31/2024 11:34:02 AM TECHNIQUE: CT of the head was performed without the administration of intravenous contrast. Automated exposure control, iterative reconstruction, and/or weight based adjustment of the mA/kV was utilized to reduce the radiation dose to as low as reasonably achievable. COMPARISON: MR head without contrast 01/22/2024. CT head and CT angio head and neck 01/21/2024. CLINICAL HISTORY: Headache, sudden, severe. FINDINGS: BRAIN AND VENTRICLES: No acute intracranial hemorrhage. No mass effect or midline shift. No extra-axial fluid collection. No evidence of acute infarct. No hydrocephalus. Remote lacunar infarcts are again noted in the basal ganglia and thalami bilaterally, right greater than left. Moderate atrophy and white matter changes are stable. [place] stroke program early CT (ASPECT) score ----- Ganglionic (caudate, IC, lentiform nucleus, insula, M1-M3): 7 Supraganglionic (M4-M6): 3 Total: 10 ORBITS: No acute  abnormality. SINUSES AND MASTOIDS: No acute abnormality. SOFT TISSUES AND SKULL: No acute skull fracture. No acute soft tissue abnormality. IMPRESSION: 1. No acute intracranial abnormality. Aspects 10/10 2. Remote lacunar infarcts in the basal ganglia and thalami bilaterally, right greater than left. 3. Moderate atrophy and white matter changes, stable. The pertinent results were texted to Dr. Lindzen via the [facility] system at 11:25 am. Electronically signed by: Lonni Necessary MD 01/31/2024 11:42 AM EDT RP Workstation: [id number]   CT Angio Chest/Abd/Pel for Dissection W and/or Wo Contrast Result Date: 01/24/2024 CLINICAL DATA:  Renal failure patient on dialysis with hypertension and worsening back pain. EXAM: CT ANGIOGRAPHY CHEST, ABDOMEN AND PELVIS TECHNIQUE: Non-contrast CT of the chest was initially obtained. Multidetector CT imaging through the chest, abdomen and pelvis was performed using the  standard protocol during bolus administration of intravenous contrast. Multiplanar reconstructed images and MIPs were obtained and reviewed to evaluate the vascular anatomy. RADIATION DOSE REDUCTION: This exam was performed according to the departmental dose-optimization program which includes automated exposure control, adjustment of the mA and/or kV according to patient size and/or use of iterative reconstruction technique. CONTRAST:  OMNIPAQUE  IOHEXOL  350 MG/ML SOLN COMPARISON:  Last chest x-ray was portable chest 12/06/2023. Comparison is made with CTA chest, abdomen and pelvis dated 12/18/2023 and CTA chest 11/27/2023. FINDINGS: CTA CHEST FINDINGS Cardiovascular: Right IJ dialysis catheter again terminates at the superior cavoatrial junction. Stable moderate cardiomegaly with left ventricular and septal wall hypertrophy and left chamber predominance. Stable central venous distention. There is no pericardial effusion. There scattered 2 vessel calcific plaques in the LAD and circumflex coronary arteries.  There is about equal opacification of the aorta and pulmonary arteries. Pulmonary trunk slightly prominent 3.1 cm as before without evidence of arterial embolism. There are calcific plaques in the aortic isthmus and proximal descending segment, calcific plaque in the ascending aorta but no aortic or great vessel aneurysm, stenosis or dissection. Mediastinum/Nodes: There is more than the typical amount of substernal thymus for a 53 year old, but this was seen previously and unchanged. There is no intrathoracic or axillary adenopathy. The lower poles of the thyroid gland, thoracic trachea, main bronchi, thoracic esophagus are unremarkable. 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 Lungs/Pleura: Chronic scarring medial basal left lower lobe. Mild posterior atelectasis. No consolidation, effusion or visible nodules. Central airways are patent. Musculoskeletal: Dense bones consistent with renal osteodystrophy. No acute or other significant osseous findings. The ribcage is intact. Review of the MIP images confirms the above findings. CTA ABDOMEN AND PELVIS FINDINGS VASCULAR Aorta: Normal caliber aorta without aneurysm, dissection, vasculitis or significant stenosis. There is mild-to-moderate patchy calcific plaque. Celiac: Patent without evidence of aneurysm, dissection, vasculitis or significant stenosis. There are mild nonstenosing calcific plaques. SMA: Patent without evidence of aneurysm, dissection, vasculitis or significant stenosis. Renals: Both are single with mild calcific plaque proximally. Both are widely patent. IMA: Patent without evidence of aneurysm, dissection, vasculitis or significant stenosis. Inflow: Patent without evidence of aneurysm, dissection, vasculitis or significant stenosis. There are patchy nonstenosing calcifications in the common iliac, internal iliac and both distal  external iliac arteries Veins: No obvious venous abnormality within the limitations of this arterial phase study. Review of the MIP images confirms the above findings. NON-VASCULAR Hepatobiliary: There is a 7 mm focal hyperenhancement in the dome of segment 8, most likely a flash filling hemangioma, seen previously. Otherwise no focal liver abnormality is seen. No gallstones, gallbladder wall thickening, or biliary dilatation. Pancreas: No abnormality. Spleen: No abnormality. Adrenals/Urinary Tract: There are multiple small subcentimeter Bosniak 2 renal cortical cysts which are too small to characterize. No follow-up imaging recommended. There is no mass enhancement in the kidneys. There is slight nodular thickening in the adrenal glands, stable. Both kidneys are small, showing moderate symmetric volume loss. There is no urinary stone or obstruction. There is contrast in the bladder. The bladder is unremarkable for the degree of distension. Stomach/Bowel: Chronic thickened folds in the stomach. Unremarkable unopacified small bowel. Normal retrocecal appendix. Moderate diffuse fecal stasis. Scattered diverticulosis without diverticulitis. Lymphatic: No adenopathy is seen. Reproductive: Uterus and bilateral adnexa are unremarkable. Other: There is presacral edema not seen previously, at the level of the common iliac and proximal  to mid internal iliac vessels, which could be seen with veno-occlusive disease such as thrombophlebitis, otherwise would be in keeping with a nonspecific inflammatory process. There is trace ascites in the posterior pelvis. No free hemorrhage or free air or abscess. No incarcerated hernia. Musculoskeletal: Dense bones. No acute or other significant osseous findings. Lumbar facet hypertrophy. Review of the MIP images confirms the above findings. IMPRESSION: 1. Aortic and coronary artery atherosclerosis. No aortic aneurysm, dissection or stenosis. 2. Cardiomegaly with left ventricular and septal  wall hypertrophy and left chamber predominance. 3. Slightly prominent pulmonary trunk but no findings of acute right heart strain. No arterial embolism is seen. 4. 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 5. Chronic thickened folds in the stomach. 6. Constipation and diverticulosis. 7. Presacral edema not seen previously, which could be seen with veno-occlusive disease such as thrombophlebitis, otherwise would be in keeping with a nonspecific inflammatory process. 8. Trace ascites in the posterior pelvis. 9. Renal osteodystrophy. Aortic Atherosclerosis (ICD10-I70.0). Electronically Signed   By: Francis Quam M.D.   On: 01/24/2024 02:46   MR BRAIN WO CONTRAST Result Date: 01/22/2024 CLINICAL DATA:  Neuro deficit, acute, stroke suspected EXAM: MRI HEAD WITHOUT CONTRAST TECHNIQUE: Multiplanar, multiecho pulse sequences of the brain and surrounding structures were obtained without intravenous contrast. COMPARISON:  MRI head 12/18/2023. FINDINGS: Brain: No acute infarction, acute hemorrhage, hydrocephalus, extra-axial collection or mass lesion. 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 Many small foci of susceptibility artifact predominantly throughout the basal ganglia, thalami, brainstem and cerebellum bilaterally, compatible with chronic microhemorrhages. Vascular: Major arterial flow voids are maintained at the skull base. Skull and upper cervical spine: Normal marrow signal. Sinuses/Orbits: Clear sinuses.  No acute orbital findings. Other: Right mastoid effusion. IMPRESSION: 1. No evidence of acute intracranial abnormality. 2. 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 3. Many chronic microhemorrhages in the basal ganglia, thalami, brainstem and cerebellum, likely the sequela of chronic hypertension. Electronically Signed   By: Gilmore GORMAN Molt M.D.   On: 01/22/2024 02:42   CT ANGIO HEAD NECK W WO CM (CODE STROKE) Result Date:  01/21/2024 CLINICAL DATA:  Left sided weakness/numbness EXAM: CT ANGIOGRAPHY HEAD AND NECK WITH AND WITHOUT CONTRAST TECHNIQUE: Multidetector CT imaging of the head and neck was performed using the standard protocol during bolus administration of intravenous contrast. Multiplanar CT image reconstructions and MIPs were obtained to evaluate the vascular anatomy. Carotid stenosis measurements (when applicable) are obtained utilizing NASCET criteria, using the distal internal carotid diameter as the denominator. RADIATION DOSE REDUCTION: This exam was performed according to the departmental dose-optimization program which includes automated exposure control, adjustment of the mA and/or kV according to patient size and/or use of iterative reconstruction technique. CONTRAST:  75mL OMNIPAQUE  IOHEXOL  350 MG/ML SOLN COMPARISON:  Same day CT head.  CTA head and neck 12/18/2023 FINDINGS: CTA NECK FINDINGS Aortic arch: Great vessel origins are patent without significant stenosis. Right carotid system: No evidence of dissection, stenosis (50% or greater), or occlusion. Left carotid system: No evidence of dissection, stenosis (50% or greater), or occlusion. Vertebral arteries: Left dominant. No evidence of dissection, stenosis (50% or greater), or occlusion. Skeleton: No acute abnormality. Other neck: No acute abnormality. Upper chest: No acute abnormality. Review of the MIP images confirms the above findings CTA HEAD FINDINGS Anterior circulation: Bilateral intracranial ICAs, MCAs, and ACAs are patent without proximal hemodynamically significant stenosis. Posterior circulation: Bilateral intradural vertebral arteries, basilar artery and bilateral posterior cerebral arteries are patent without  proximal hemodynamically significant stenosis. Venous sinuses: As permitted by contrast timing, patent. Review of the MIP images confirms the above findings IMPRESSION: No emergent large vessel occlusion or proximal hemodynamically  significant stenosis. Electronically Signed   By: Gilmore GORMAN Molt M.D.   On: 01/21/2024 22:30   CT HEAD CODE STROKE WO CONTRAST Result Date: 01/21/2024 CLINICAL DATA:  Code stroke.  Neuro deficit, acute, stroke suspected EXAM: CT HEAD WITHOUT CONTRAST TECHNIQUE: Contiguous axial images were obtained from the base of the skull through the vertex without intravenous contrast. RADIATION DOSE REDUCTION: This exam was performed according to the departmental dose-optimization program which includes automated exposure control, adjustment of the mA and/or kV according to patient size and/or use of iterative reconstruction technique. COMPARISON:  CT head 12/18/2023. FINDINGS: Brain: No evidence of acute large vascular territory infarction, hemorrhage, hydrocephalus, extra-axial collection or mass lesion/mass effect. Similar patchy white matter hypodensities. Vascular: Hyperdense appearance of the right M2 MCA (for example see series 6, image 12). Skull: No acute fracture. Sinuses/Orbits: Clear sinuses. ASPECTS [facility] Stroke Program Early CT Score) Total score (0-10 with 10 being normal): 10. IMPRESSION: 1. Hyperdense appearance of a right M2 MCA vessel, possibly thrombosed. Recommend stat CTA of the head/neck to further evaluate. 2. No convincing evidence of acute large vascular territory infarct. 3. No acute hemorrhage. Findings cos with Dr. Jossie via telephone at 9:56 PM. Electronically Signed   By: Gilmore GORMAN Molt M.D.   On: 01/21/2024 22:00     Assessment/Plan 1. ESRD on dialysis [facility]) (Primary) Recommend:  At this time the patient does not have appropriate extremity access for dialysis  Patient should have a left arm brachial axillary AV graft created.  The risks, benefits and alternative therapies were reviewed in detail with the patient.  All questions were answered.  The patient agrees to proceed with surgery.   The patient will follow up with me in the office after the surgery.  2. Primary  hypertension Continue antihypertensive medications as already ordered, these medications have been reviewed and there are no changes at this time.    Cordella Shawl, MD  02/14/2024 12:11 PM

## 2024-02-20 ENCOUNTER — Emergency Department

## 2024-02-20 ENCOUNTER — Emergency Department
Admission: EM | Admit: 2024-02-20 | Discharge: 2024-02-20 | Discharge status: Against Medical Advice | Attending: Emergency Medicine | Admitting: Emergency Medicine

## 2024-02-20 ENCOUNTER — Other Ambulatory Visit: Payer: Self-pay

## 2024-02-20 DIAGNOSIS — R0789 Other chest pain: Secondary | ICD-10-CM | POA: Diagnosis not present

## 2024-02-20 DIAGNOSIS — Z5321 Procedure and treatment not carried out due to patient leaving prior to being seen by health care provider: Secondary | ICD-10-CM | POA: Diagnosis not present

## 2024-02-20 DIAGNOSIS — R0602 Shortness of breath: Secondary | ICD-10-CM | POA: Insufficient documentation

## 2024-02-20 LAB — BASIC METABOLIC PANEL WITH GFR
Anion gap: 15 (ref 5–15)
BUN: 65 mg/dL — ABNORMAL HIGH (ref 6–20)
CO2: 25 mmol/L (ref 22–32)
Calcium: 8.8 mg/dL — ABNORMAL LOW (ref 8.9–10.3)
Chloride: 100 mmol/L (ref 98–111)
Creatinine, Ser: 6.25 mg/dL — ABNORMAL HIGH (ref 0.44–1.00)
GFR, Estimated: 8 mL/min — ABNORMAL LOW (ref >60)
Glucose, Bld: 130 mg/dL — ABNORMAL HIGH (ref 70–99)
Potassium: 4.3 mmol/L (ref 3.5–5.1)
Sodium: 140 mmol/L (ref 135–145)

## 2024-02-20 LAB — CBC
HCT: 38.1 % (ref 36.0–46.0)
Hemoglobin: 12.7 g/dL (ref 12.0–15.0)
MCH: 26.6 pg (ref 26.0–34.0)
MCHC: 33.3 g/dL (ref 30.0–36.0)
MCV: 79.7 fL — ABNORMAL LOW (ref 80.0–100.0)
Platelets: 134 K/uL — ABNORMAL LOW (ref 150–400)
RBC: 4.78 MIL/uL (ref 3.87–5.11)
RDW: 16.1 % — ABNORMAL HIGH (ref 11.5–15.5)
WBC: 5.6 K/uL (ref 4.0–10.5)
nRBC: 0 % (ref 0.0–0.2)

## 2024-02-20 LAB — TROPONIN I (HIGH SENSITIVITY): Troponin I (High Sensitivity): 41 ng/L — ABNORMAL HIGH (ref <18)

## 2024-02-20 NOTE — ED Notes (Signed)
 2 attempts made to draw blood on pt. Pt refuses any more blood attempts until she is roomed and an ultrasound can be used.

## 2024-02-20 NOTE — ED Triage Notes (Signed)
 Pt comes in via [facility] with complaints of SOB the past couple of hours. Pt complains of chest tightness 5/10. According to EMS, the patient received news of a family member passing shortly before they were called out. Pt hypertensive with EMS 153/90. Pt is on dialysis, and goes Monday, Tuesday, Wednesday and Friday. Pt did have complete treatment yesterday. Pt is alert and oriented x4.

## 2024-02-22 ENCOUNTER — Telehealth (INDEPENDENT_AMBULATORY_CARE_PROVIDER_SITE_OTHER): Payer: Self-pay

## 2024-02-22 NOTE — Telephone Encounter (Signed)
 I attempted to contact the person at [facility] to schedule the patient for a left arm brachial axillary graft with Dr. Jama. A message was left for a return call.

## 2024-02-23 NOTE — Telephone Encounter (Addendum)
 Patient called today to be scheduled for her surgery, I had reached out  to [facility] to get the patient scheduled with [facility] 02/22/24. A message was left for a return call. Per the patient I needed to call [facility]. I contacted again and again had to leave a message for a return call. I have scheduled the patient for a left arm brachial axillary AV graft with Dr. Jama on 03/11/24 at the [facility]. Pre-admit will call to schedule a pre-op  appt at the [facility]. Pre- surgical instructions will be faxed to [facility] attn: [facility].

## 2024-03-02 ENCOUNTER — Other Ambulatory Visit (INDEPENDENT_AMBULATORY_CARE_PROVIDER_SITE_OTHER): Payer: Self-pay | Admitting: Nurse Practitioner

## 2024-03-02 DIAGNOSIS — N186 End stage renal disease: Secondary | ICD-10-CM

## 2024-03-03 ENCOUNTER — Inpatient Hospital Stay
Admission: RE | Admit: 2024-03-03 | Discharge: 2024-03-03 | Disposition: A | Discharge status: Home / Self Care | Source: Ambulatory Visit

## 2024-03-03 DIAGNOSIS — N186 End stage renal disease: Secondary | ICD-10-CM

## 2024-03-03 DIAGNOSIS — Z01812 Encounter for preprocedural laboratory examination: Secondary | ICD-10-CM

## 2024-03-03 NOTE — Pre-Procedure Instructions (Signed)
 Patient was a no show for PAT appointment today, facility [facility] made aware, message left with [facility] to call to reschedule appointment. Orvin Daring NP made aware.

## 2024-03-03 NOTE — Patient Instructions (Signed)
 Your procedure is scheduled on: 03/11/24 - Friday Report to the Registration Desk on the 1st floor of the [facility]. To find out your arrival time, please call [phone] between 1PM - 3PM on: 03/10/24 - Thursday If your arrival time is 6:00 am, do not arrive before that time as the Medical Mall entrance doors do not open until 6:00 am.  REMEMBER: Instructions that are not followed completely may result in serious medical risk, up to and including death; or upon the discretion of your surgeon and anesthesiologist your surgery may need to be rescheduled.  Do not eat food after midnight the night before surgery.  No gum chewing or hard candies.   One week prior to surgery: Stop Anti-inflammatories (NSAIDS) such as Advil, Aleve, Ibuprofen, Motrin, Naproxen, Naprosyn and Aspirin  based products such as Excedrin, Goody's Powder, BC Powder. You may take Tylenol  if needed for pain up until the day of surgery.  Stop ANY OVER THE COUNTER supplements until after surgery.   *ON THE DAY OF SURGERY ONLY TAKE THESE MEDICATIONS WITH SIPS OF WATER:  amLODipine  (NORVASC )  gabapentin  (NEURONTIN ) hydrALAZINE  (APRESOLINE )   labetalol  (NORMODYNE )  pantoprazole  (PROTONIX )   No Alcohol for 24 hours before or after surgery.  No Smoking including e-cigarettes for 24 hours before surgery.  No chewable tobacco products for at least 6 hours before surgery.  No nicotine patches on the day of surgery.  Do not use any recreational drugs for at least a week (preferably 2 weeks) before your surgery.  Please be advised that the combination of cocaine and anesthesia may have negative outcomes, up to and including death. If you test positive for cocaine, your surgery will be cancelled.  On the morning of surgery brush your teeth with toothpaste and water, you may rinse your mouth with mouthwash if you wish. Do not swallow any toothpaste or mouthwash.  Use CHG Soap or wipes as directed on instruction  sheet.  Do not wear jewelry, make-up, hairpins, clips or nail polish.  For welded (permanent) jewelry: bracelets, anklets, waist bands, etc.  Please have this removed prior to surgery.  If it is not removed, there is a chance that hospital personnel will need to cut it off on the day of surgery.  Do not wear lotions, powders, or perfumes.   Do not shave body hair from the neck down 48 hours before surgery.  Contact lenses, hearing aids and dentures may not be worn into surgery.  Do not bring valuables to the hospital. [facility] is not responsible for any missing/lost belongings or valuables.   Notify your doctor if there is any change in your medical condition (cold, fever, infection).  Wear comfortable clothing (specific to your surgery type) to the hospital.  After surgery, you can help prevent lung complications by doing breathing exercises.  Take deep breaths and cough every 1-2 hours. Your doctor may order a device called an Incentive Spirometer to help you take deep breaths.  When coughing or sneezing, hold a pillow firmly against your incision with both hands. This is called "splinting." Doing this helps protect your incision. It also decreases belly discomfort.  If you are being admitted to the hospital overnight, leave your suitcase in the car. After surgery it may be brought to your room.  In case of increased patient census, it may be necessary for you, the patient, to continue your postoperative care in the Same Day Surgery department.  If you are being discharged the day of surgery, you  will not be allowed to drive home. You will need a responsible individual to drive you home and stay with you for 24 hours after surgery.   If you are taking public transportation, you will need to have a responsible individual with you.  Please call the Pre-admissions Testing Dept. at [phone] if you have any questions about these instructions.  Surgery Visitation  Policy:  Patients having surgery or a procedure may have two visitors.  Children under the age of 45 must have an adult with them who is not the patient.  Inpatient Visitation:    Visiting hours are 7 a.m. to 8 p.m. Up to four visitors are allowed at one time in a patient room. The visitors may rotate out with other people during the day.  One visitor age 1 or older may stay with the patient overnight and must be in the room by 8 p.m.   Merchandiser, retail to address health-related social needs:  [email or web address]                                                                                                            Preparing for Surgery with CHLORHEXIDINE  GLUCONATE (CHG) Soap  Chlorhexidine  Gluconate (CHG) Soap  o An antiseptic cleaner that kills germs and bonds with the skin to continue killing germs even after washing  o Used for showering the night before surgery and morning of surgery  Before surgery, you can play an important role by reducing the number of germs on your skin.  CHG (Chlorhexidine  gluconate) soap is an antiseptic cleanser which kills germs and bonds with the skin to continue killing germs even after washing.  Please do not use if you have an allergy to CHG or antibacterial soaps. If your skin becomes reddened/irritated stop using the CHG.  1. Shower the NIGHT BEFORE SURGERY with CHG soap.  2. If you choose to wash your hair, wash your hair first as usual with your normal shampoo.  3. After shampooing, rinse your hair and body thoroughly to remove the shampoo.  4. Use CHG as you would any other liquid soap. You can apply CHG directly to the skin and wash gently with a clean washcloth.  5. Apply the CHG soap to your body only from the neck down. Do not use on open wounds or open sores. Avoid contact with your eyes, ears, mouth, and genitals (private parts). Wash face and genitals (private parts) with your normal soap.  6. Wash  thoroughly, paying special attention to the area where your surgery will be performed.  7. Thoroughly rinse your body with warm water.  8. Do not shower/wash with your normal soap after using and rinsing off the CHG soap.  9. Do not use lotions, oils, etc., after showering with CHG.  10. Pat yourself dry with a clean towel.  11. Wear clean pajamas to bed the night before surgery.  12. Place clean sheets on your bed the night of your shower and do not sleep with pets.  13. Do not apply any  deodorants/lotions/powders.  14. Please wear clean clothes to the hospital.  15. Remember to brush your teeth with your regular toothpaste.

## 2024-03-07 ENCOUNTER — Emergency Department

## 2024-03-07 ENCOUNTER — Other Ambulatory Visit: Payer: Self-pay

## 2024-03-07 ENCOUNTER — Observation Stay
Admission: EM | Admit: 2024-03-07 | Discharge: 2024-03-10 | Disposition: A | Discharge status: Skilled Nursing Facility | Attending: Student in an Organized Health Care Education/Training Program | Admitting: Student in an Organized Health Care Education/Training Program

## 2024-03-07 DIAGNOSIS — E66811 Obesity, class 1: Secondary | ICD-10-CM | POA: Diagnosis not present

## 2024-03-07 DIAGNOSIS — Z79899 Other long term (current) drug therapy: Secondary | ICD-10-CM | POA: Insufficient documentation

## 2024-03-07 DIAGNOSIS — I132 Hypertensive heart and chronic kidney disease with heart failure and with stage 5 chronic kidney disease, or end stage renal disease: Secondary | ICD-10-CM | POA: Insufficient documentation

## 2024-03-07 DIAGNOSIS — Z6833 Body mass index (BMI) 33.0-33.9, adult: Secondary | ICD-10-CM | POA: Insufficient documentation

## 2024-03-07 DIAGNOSIS — E669 Obesity, unspecified: Secondary | ICD-10-CM | POA: Diagnosis present

## 2024-03-07 DIAGNOSIS — I639 Cerebral infarction, unspecified: Secondary | ICD-10-CM | POA: Diagnosis present

## 2024-03-07 DIAGNOSIS — Z87891 Personal history of nicotine dependence: Secondary | ICD-10-CM | POA: Diagnosis not present

## 2024-03-07 DIAGNOSIS — I5032 Chronic diastolic (congestive) heart failure: Secondary | ICD-10-CM | POA: Insufficient documentation

## 2024-03-07 DIAGNOSIS — R2689 Other abnormalities of gait and mobility: Secondary | ICD-10-CM | POA: Diagnosis present

## 2024-03-07 DIAGNOSIS — R29810 Facial weakness: Secondary | ICD-10-CM

## 2024-03-07 DIAGNOSIS — H539 Unspecified visual disturbance: Principal | ICD-10-CM

## 2024-03-07 DIAGNOSIS — I69354 Hemiplegia and hemiparesis following cerebral infarction affecting left non-dominant side: Secondary | ICD-10-CM | POA: Insufficient documentation

## 2024-03-07 DIAGNOSIS — D696 Thrombocytopenia, unspecified: Secondary | ICD-10-CM | POA: Insufficient documentation

## 2024-03-07 DIAGNOSIS — F1490 Cocaine use, unspecified, uncomplicated: Secondary | ICD-10-CM | POA: Diagnosis present

## 2024-03-07 DIAGNOSIS — I10 Essential (primary) hypertension: Secondary | ICD-10-CM | POA: Diagnosis present

## 2024-03-07 DIAGNOSIS — Z992 Dependence on renal dialysis: Secondary | ICD-10-CM | POA: Diagnosis not present

## 2024-03-07 DIAGNOSIS — R519 Headache, unspecified: Secondary | ICD-10-CM | POA: Diagnosis present

## 2024-03-07 DIAGNOSIS — I6389 Other cerebral infarction: Secondary | ICD-10-CM | POA: Diagnosis not present

## 2024-03-07 DIAGNOSIS — N186 End stage renal disease: Secondary | ICD-10-CM | POA: Diagnosis not present

## 2024-03-07 DIAGNOSIS — F14920 Cocaine use, unspecified with intoxication, uncomplicated: Secondary | ICD-10-CM | POA: Insufficient documentation

## 2024-03-07 LAB — CBC
HCT: 31.2 % — ABNORMAL LOW (ref 36.0–46.0)
Hemoglobin: 10.2 g/dL — ABNORMAL LOW (ref 12.0–15.0)
MCH: 25.6 pg — ABNORMAL LOW (ref 26.0–34.0)
MCHC: 32.7 g/dL (ref 30.0–36.0)
MCV: 78.4 fL — ABNORMAL LOW (ref 80.0–100.0)
Platelets: 132 K/uL — ABNORMAL LOW (ref 150–400)
RBC: 3.98 MIL/uL (ref 3.87–5.11)
RDW: 16.8 % — ABNORMAL HIGH (ref 11.5–15.5)
WBC: 5.8 K/uL (ref 4.0–10.5)
nRBC: 0 % (ref 0.0–0.2)

## 2024-03-07 LAB — DIFFERENTIAL
Abs Immature Granulocytes: 0.01 K/uL (ref 0.00–0.07)
Basophils Absolute: 0 K/uL (ref 0.0–0.1)
Basophils Relative: 0 %
Eosinophils Absolute: 0.2 K/uL (ref 0.0–0.5)
Eosinophils Relative: 3 %
Immature Granulocytes: 0 %
Lymphocytes Relative: 28 %
Lymphs Abs: 1.6 K/uL (ref 0.7–4.0)
Monocytes Absolute: 0.6 K/uL (ref 0.1–1.0)
Monocytes Relative: 11 %
Neutro Abs: 3.4 K/uL (ref 1.7–7.7)
Neutrophils Relative %: 58 %

## 2024-03-07 LAB — COMPREHENSIVE METABOLIC PANEL WITH GFR
ALT: 17 U/L (ref 0–44)
AST: 24 U/L (ref 15–41)
Albumin: 3.8 g/dL (ref 3.5–5.0)
Alkaline Phosphatase: 377 U/L — ABNORMAL HIGH (ref 38–126)
Anion gap: 15 (ref 5–15)
BUN: 83 mg/dL — ABNORMAL HIGH (ref 6–20)
CO2: 25 mmol/L (ref 22–32)
Calcium: 8.4 mg/dL — ABNORMAL LOW (ref 8.9–10.3)
Chloride: 99 mmol/L (ref 98–111)
Creatinine, Ser: 7.45 mg/dL — ABNORMAL HIGH (ref 0.44–1.00)
GFR, Estimated: 6 mL/min — ABNORMAL LOW (ref >60)
Glucose, Bld: 98 mg/dL (ref 70–99)
Potassium: 4 mmol/L (ref 3.5–5.1)
Sodium: 139 mmol/L (ref 135–145)
Total Bilirubin: 0.7 mg/dL (ref 0.0–1.2)
Total Protein: 7.5 g/dL (ref 6.5–8.1)

## 2024-03-07 LAB — PROTIME-INR
INR: 0.9 (ref 0.8–1.2)
Prothrombin Time: 13 s (ref 11.4–15.2)

## 2024-03-07 LAB — CBG MONITORING, ED: Glucose-Capillary: 99 mg/dL (ref 70–99)

## 2024-03-07 LAB — ETHANOL: Alcohol, Ethyl (B): <15 mg/dL (ref <15)

## 2024-03-07 LAB — APTT: aPTT: 46 s — ABNORMAL HIGH (ref 24–36)

## 2024-03-07 MED ORDER — ACETAMINOPHEN 650 MG RE SUPP: 650.0000 mg | RECTAL | Status: DC | PRN

## 2024-03-07 MED ORDER — HYDRALAZINE HCL 20 MG/ML IJ SOLN
10.0000 mg | INTRAMUSCULAR | Status: DC | PRN
  Administered 2024-03-08 – 2024-03-09 (×4): 10 mg via INTRAVENOUS
  Filled 2024-03-07: qty 1
  Filled 2024-03-07: qty 0.5
  Filled 2024-03-07 (×2): qty 1

## 2024-03-07 MED ORDER — SODIUM CHLORIDE 0.9% FLUSH
3.0000 mL | Freq: Once | INTRAVENOUS | Status: AC
  Administered 2024-03-07: 3 mL via INTRAVENOUS

## 2024-03-07 MED ORDER — SODIUM CHLORIDE 0.9 % IV SOLN: INTRAVENOUS | Status: DC

## 2024-03-07 MED ORDER — LABETALOL HCL 5 MG/ML IV SOLN
10.0000 mg | INTRAVENOUS | Status: DC
  Filled 2024-03-07: qty 4

## 2024-03-07 MED ORDER — ACETAMINOPHEN 160 MG/5ML PO SOLN: 650.0000 mg | ORAL | Status: DC | PRN

## 2024-03-07 MED ORDER — DIPHENHYDRAMINE HCL 50 MG/ML IJ SOLN: 12.5000 mg | Freq: Three times a day (TID) | INTRAMUSCULAR | Status: DC | PRN

## 2024-03-07 MED ORDER — HEPARIN SODIUM (PORCINE) 5000 UNIT/ML IJ SOLN
5000.0000 [IU] | Freq: Three times a day (TID) | INTRAMUSCULAR | Status: DC
  Administered 2024-03-08 – 2024-03-10 (×6): 5000 [IU] via SUBCUTANEOUS
  Filled 2024-03-07 (×7): qty 1

## 2024-03-07 MED ORDER — ASPIRIN 81 MG PO CHEW
324.0000 mg | CHEWABLE_TABLET | Freq: Once | ORAL | Status: AC
Start: 2024-03-07 — End: 2024-03-07
  Administered 2024-03-07: 324 mg via ORAL
  Filled 2024-03-07: qty 4

## 2024-03-07 MED ORDER — LABETALOL HCL 5 MG/ML IV SOLN: 20.0000 mg | INTRAVENOUS | Status: DC

## 2024-03-07 MED ORDER — ACETAMINOPHEN 325 MG PO TABS
650.0000 mg | ORAL_TABLET | ORAL | Status: DC | PRN
  Administered 2024-03-08 – 2024-03-09 (×2): 650 mg via ORAL
  Filled 2024-03-07 (×3): qty 2

## 2024-03-07 MED ORDER — IOHEXOL 350 MG/ML SOLN
100.0000 mL | Freq: Once | INTRAVENOUS | Status: AC | PRN
  Administered 2024-03-07: 100 mL via INTRAVENOUS

## 2024-03-07 MED ORDER — STROKE: EARLY STAGES OF RECOVERY BOOK: Freq: Once | Status: DC

## 2024-03-07 MED ORDER — SENNOSIDES-DOCUSATE SODIUM 8.6-50 MG PO TABS: 1.0000 | ORAL_TABLET | Freq: Every evening | ORAL | Status: DC | PRN

## 2024-03-07 NOTE — ED Notes (Signed)
 CCMD called to initiate cardiac monitoring.

## 2024-03-07 NOTE — ED Notes (Signed)
 CCMD called to transfer pt cardiac monitoring to ED30 - spoke with Caitlyn.

## 2024-03-07 NOTE — ED Notes (Signed)
 Called to carelink per Charge RN Shanda @713pm Gregoria Code Stroke/MD Eleanore Cramp.

## 2024-03-07 NOTE — ED Notes (Signed)
Pt in room

## 2024-03-07 NOTE — Consult Note (Addendum)
 [facility] Tele[facility]rology Consult Services   Patient Name:   Allison Hill, Allison Hill Date of Birth:   1970-12-25 Identification Number:   MRN - [id number] Date of Service:   03/07/2024 19:23:33  Diagnosis:       I63.89 - Cerebrovascular accident (CVA) due to other mechanism (HCCC)       R53.1 - Weakness       R26.81 - Unsteady gait  Impression:      53y/o woman w/ vascular risk factors, residual Lt sided weakness after ICH/strokes, presenting for evaluation after having sudden onset of worsening Lt sided weakness, dysarthria and unsteady gait, onset approx 1730 while in HD, NIHSS: 10, head CT w/ no acute findings in my review. She is not an IV thrombolytics candidate as has hx of ICH of unclear etiology per her report, CTA/CTP w/ no LVO, no indications for acute NIR. Clinical presentation concerning for posterior circulation acute neurovascular event.  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  Our recommendations are outlined below.  Recommendations:        Stroke/Telemetry Floor       Neuro Checks       Bedside Swallow Eval       DVT Prophylaxis       IV Fluids, Normal Saline       Head of Bed 30 Degrees       Euglycemia and Avoid Hyperthermia (PRN Acetaminophen )       Initiate Aspirin  325 MG daily       Antihypertensives PRN if Blood pressure is greater than 220/120 or there is a concern for End organ damage/contraindications for permissive HTN. If blood pressure is greater than 220/120 give labetalol  PO or IV or Vasotec IV with a goal of 15% reduction in BP during the first 24 hours.       infectious/metabolic work up per ED/primary team, Utox       Consider MRI brain w/o gad, 2Decho, HgbA1c, B12 levels, Lipids and TSH levels for stroke work up  Sign Out:       Discussed with Emergency Department  Provider    ------------------------------------------------------------------------------  Advanced Imaging: CTA/CTP w/ no LVO, no indications for acute NIR.   Metrics: Last Known Well: 03/07/2024 17:30:00 Dispatch Time: 03/07/2024 19:23:33 Arrival Time: 03/07/2024 18:26:00 Initial Response Time: 03/07/2024 19:27:40 Symptoms: unsteady gait, Lt sided weakness. Initial patient interaction: 03/07/2024 19:29:07 NIHSS Assessment Completed: 03/07/2024 19:37:43 Patient is not a candidate for Thrombolytic. Thrombolytic Medical Decision: 03/07/2024 19:42:45 Patient was not deemed candidate for Thrombolytic because of following reasons: History of previous intracranial hemorrhage, intracranial neoplasm .  CT Head: I personally reviewed all the CT images that were available to me and it showed: no acute findings  Primary Provider Notified of Diagnostic Impression and Management Plan on: 03/07/2024 19:47:58    ------------------------------------------------------------------------------  History of Present Illness:  Patient was brought by EMS for symptoms of unsteady gait, Lt sided weakness. 53y/o woman w/ hx of ICH, HTN, CHF, ESRD on HD, hx of stroke 2023 w/ residual Lt sided weakness, presenting for evaluation after having sudden onset of unsteady gait and worsening Lt sided weakness, dysarthria, onset approx 1730 while in HD.   Past Medical History:      Hypertension      Stroke  Medications:  No Anticoagulant use  Antiplatelet use: Yes aspirin  81mg  Reviewed EMR for current medications  Allergies:  Reviewed  Social History: Smoking: No  Family History:  There is no family history of premature cerebrovascular disease pertinent to this consultation  ROS : 14 Points Review of Systems was performed and was negative except mentioned in HPI.  Past Surgical History: There Is No Surgical History Contributory To Today's Visit    Examination: BP(195/127), Pulse(85),  Blood Glucose(99) 1A: Level of Consciousness - Alert; keenly responsive + 0 1B: Ask Month and Age - Both Questions Right + 0 1C: Blink Eyes & Squeeze Hands - Performs Both Tasks + 0 2: Test Horizontal Extraocular Movements - Normal + 0 3: Test Visual Fields - Partial Hemianopia + 1 4: Test Facial Palsy (Use Grimace if Obtunded) - Unilateral Complete paralysis (upper/lower face) + 3 5A: Test Left Arm Motor Drift - Some Effort Against Gravity + 2 5B: Test Right Arm Motor Drift - No Drift for 10 Seconds + 0 6A: Test Left Leg Motor Drift - Some Effort Against Gravity + 2 6B: Test Right Leg Motor Drift - No Drift for 5 Seconds + 0 7: Test Limb Ataxia (FNF/Heel-Shin) - No Ataxia + 0 8: Test Sensation - Mild-Moderate Loss: Less Sharp/More Dull + 1 9: Test Language/Aphasia - Normal; No aphasia + 0 10: Test Dysarthria - Mild-Moderate Dysarthria: Slurring but can be understood + 1 11: Test Extinction/Inattention - No abnormality + 0  NIHSS Score: 10  NIHSS Free Text : decreased sensation in Lt side of face and Lt leg  Pre-Morbid Modified Rankin Scale: 3 Points = Moderate disability; requiring some help, but able to walk without assistance  Spoke with : Dr. Floy  This consult was conducted in real time using interactive audio and Immunologist. Patient was informed of the technology being used for this visit and agreed to proceed. Patient located in hospital and provider located at home/office setting.   Patient is being evaluated for possible acute neurologic impairment and high probability of imminent or life-threatening deterioration. I spent total of 35 minutes providing care to this patient, including time for face to face visit via telemedicine, review of medical records, imaging studies and discussion of findings with providers, the patient and/or family.    Dr Karey Sheerer Beulah Humphreys   [facility] For Inpatient follow-up with [facility] physician please call [facility] at  [phone]. As we are not an outpatient service for any post hospital discharge needs please contact the hospital for assistance. If you have any questions for the [facility] physicians or need to reconsult for clinical or diagnostic changes please contact us  via [facility] at [phone].   Signature : Karey Sheerer Jesus Alvelo

## 2024-03-07 NOTE — ED Notes (Signed)
 Pt given [place] sandwich tray per request. Okay per MD.

## 2024-03-07 NOTE — ED Triage Notes (Signed)
 Pt to ED via [facility] from [facility]. Pt reports HA and elevated BP. Missed dialysis this morning. Pt reports symptoms started around 11:30am. Hx of migraines  HR 90 170/90 99% RA

## 2024-03-07 NOTE — ED Notes (Signed)
 Per neurologist  Allison Hill - permissive hypertension with max BP 220/110.

## 2024-03-07 NOTE — ED Notes (Signed)
 Haven MD on televisit

## 2024-03-07 NOTE — H&P (Signed)
 History and Physical    Allison Hill FMW:[id number] DOB: 1971/05/26 DOA: 03/07/2024  Referring MD/NP/PA:   PCP: Patient, No Pcp Per   Patient coming from:  The patient is coming from home.     Chief Complaint: Difficulty speaking, unsteady gait  HPI: Allison Hill is a 53 y.o. female with medical history significant of ICH, stroke with left-sided weakness, HTN, dCHF, thrombocytopenia, ESRD-HD (MTWF), vertigo, obesity, former smoker, cocaine abuse, who presents with difficulty speaking, unsteady gait.  Patient states she had 2 strokes in the past with left-sided weakness and left facial droop.  At about 17:30, she developed difficulty speaking and worsening left weakness, with unsteady gait.  No numbness or tingling in extremities.  She still has left facial droop.  No fall.  Patient does not have chest pain, cough, SOB.  Patient is constipated.  No nausea, vomiting, diarrhea or abdominal pain.  No symptoms of UTI.  Patient had dialysis on Friday.  Data reviewed independently and ED Course: pt was found to have WBC 5.8, potassium 4.0, INR 0.9, PTT 46, alcohol level less than 15.  Temperature normal, blood pressure 195/127, heart rate 85, RR 18, oxygen saturation 100% on room air.  Chest x-ray negative.  CTA of head and neck with perfusion is negative for LVO and no acute ischemia. Pt is placed in telemetry bed for patient.  Teleneurology, Dr. Gleda Humphreys is consulted.  Dr. Dennise of renal is consulted.  CTA of head and neck with with perfusion 1. No evidence of ischemia by CT brain perfusion. 2. No acute large vessel occlusion. 3. No hemodynamically significant stenosis or aneurysm in the head or neck vessels.   EKG: I have personally reviewed.  Sinus rhythm, QTc 506, LAE, nonspecific T wave change.   Review of Systems:   General: no fevers, chills, no body weight gain, has fatigue HEENT: no blurry vision, hearing changes or sore throat Respiratory: no dyspnea, coughing,  wheezing CV: no chest pain, no palpitations GI: no nausea, vomiting, abdominal pain, diarrhea, constipation GU: no dysuria, burning on urination, increased urinary frequency, hematuria  Ext: no leg edema Neuro: Has left-sided weakness, difficulty speaking, left facial droop, unsteady gait Skin: no rash, no skin tear. MSK: No muscle spasm, no deformity, no limitation of range of movement in spin Heme: No easy bruising.  Travel history: No recent long distant travel.   Allergy:  Allergies  Allergen Reactions   Ceftriaxone Shortness Of Breath and Hives    Allergy, hives   Shellfish Allergy Hives   Minoxidil Swelling    Past Medical History:  Diagnosis Date   CHF (congestive heart failure) (HCC)    Hypertension    Renal disorder    ESRD on dialysis   Stroke (HCC)    L hemiparesis, 2015, 2023    No past surgical history on file.  Social History:  reports that she has quit smoking. Her smoking use included cigarettes. She has never used smokeless tobacco. She reports that she does not currently use alcohol. She reports current drug use. Drug: Codeine.  Family History:  Family History  Problem Relation Age of Onset   Heart failure Mother    CAD Brother      Prior to Admission medications   Medication Sig Start Date End Date Taking? Authorizing Provider  acetaminophen  (TYLENOL ) 500 MG tablet Take 500 mg by mouth 3 (three) times daily.    [provider]  amLODipine  (NORVASC ) 10 MG tablet Take 1 tablet (10 mg total) by mouth  daily. 01/24/24 03/24/24  Levander Slate, MD  atorvastatin  (LIPITOR) 40 MG tablet Take 40 mg by mouth every evening. 07/05/23   [provider]  calcitRIOL  (ROCALTROL ) 0.25 MCG capsule Take 1 capsule (0.25 mcg total) by mouth 4 (four) times a week. Take one capsule by mouth every Mon, Tue, Wed, Fri with dialysis Patient taking differently: Take 0.75 mcg by mouth every Monday, Tuesday, Wednesday, Thursday, and Friday. Administered at dialysis every  Mon, Tue, Wed, Thurs and Fri. 12/10/23   Dezii, Alexandra, DO  calcium  carbonate (TUMS - DOSED IN MG ELEMENTAL CALCIUM ) 500 MG chewable tablet Chew 2 tablets (400 mg of elemental calcium  total) by mouth 2 (two) times daily. 09/23/23   Awanda City, MD  cinacalcet  (SENSIPAR ) 60 MG tablet Take 60 mg by mouth daily with supper. 11/30/23   [provider]  cloNIDine  (CATAPRES  - DOSED IN MG/24 HR) 0.3 mg/24hr patch Place 0.3 mg onto the skin every Friday.    [provider]  diclofenac  Sodium (VOLTAREN ) 1 % GEL Apply 2 g topically 2 (two) times daily. Apply to left shoulder.    [provider]  epoetin  alfa (EPOGEN ) 4000 UNIT/ML injection Inject 1 mL (4,000 Units total) into the vein Every Tuesday,Thursday,and Saturday with dialysis. Patient taking differently: Inject 4,000 Units into the vein See admin instructions. Administer 1 ml into the vein every Monday, Tuesday, Wednesday and Friday. Given at dialysis 09/24/23   Awanda City, MD  fluticasone  (FLONASE ) 50 MCG/ACT nasal spray Place 1 spray into both nostrils daily as needed for allergies or rhinitis. 09/23/23   Awanda City, MD  gabapentin  (NEURONTIN ) 100 MG capsule Take 200 mg by mouth 3 (three) times daily.    [provider]  hydrALAZINE  (APRESOLINE ) 50 MG tablet Take 1 tablet (50 mg total) by mouth every 8 (eight) hours. 02/02/24   Josette Ade, MD  irbesartan  (AVAPRO ) 300 MG tablet Take 1 tablet (300 mg total) by mouth daily. 02/02/24   Josette Ade, MD  isosorbide  mononitrate (IMDUR ) 120 MG 24 hr tablet Take 120 mg by mouth every evening.    [provider]  labetalol  (NORMODYNE ) 200 MG tablet Take 1 tablet (200 mg total) by mouth 2 (two) times daily. 02/02/24   Josette Ade, MD  lidocaine  4 % Place 1 patch onto the skin every evening. Remove & Discard patch within 12 hours or as directed by MD. Apply to lower back in the night and remove at the morning.    [provider]  melatonin 3 MG TABS tablet  Take 3 mg by mouth at bedtime. 12/26/22   [provider]  pantoprazole  (PROTONIX ) 40 MG tablet Take 1 tablet (40 mg total) by mouth daily. 09/24/23   Awanda City, MD  polyethylene glycol (MIRALAX  / GLYCOLAX ) 17 g packet Take 17 g by mouth daily as needed. 09/23/23   Awanda City, MD  polyethylene glycol (MIRALAX  / GLYCOLAX ) 17 g packet Take 17 g by mouth in the morning. Scheduled    [provider]  senna-docusate (SENOKOT-S) 8.6-50 MG tablet Take 2 tablets by mouth 2 (two) times daily as needed for mild constipation. 09/23/23   Awanda City, MD  sevelamer  carbonate (RENVELA ) 800 MG tablet Take 1,600 mg by mouth 3 (three) times daily with meals.    [provider]    Physical Exam: Vitals:   03/07/24 1932 03/07/24 1939 03/07/24 2130 03/07/24 2230  BP:  (!) 195/127 (!) 188/115 (!) 181/126  Pulse:  85 82 85  Resp:  18 18 16   Temp:    98.4 F (36.9 C)  TempSrc:    Oral  SpO2:  100% 100% 99%  Weight: 84.6 kg     Height:       General: Not in acute distress HEENT:       Eyes: PERRL, EOMI, no jaundice       ENT: No discharge from the ears and nose, no pharynx injection, no tonsillar enlargement.        Neck: No JVD, no bruit, no mass felt. Heme: No neck lymph node enlargement. Cardiac: S1/S2, RRR, No murmurs, No gallops or rubs. Respiratory: No rales, wheezing, rhonchi or rubs. GI: Soft, nondistended, nontender, no rebound pain, no organomegaly, BS present. GU: No hematuria Ext: No pitting leg edema bilaterally. 1+DP/PT pulse bilaterally. Musculoskeletal: No joint deformities, No joint redness or warmth, no limitation of ROM in spin. Skin: No rashes.  Neuro: Alert, oriented X3, cranial nerves II-XII grossly intact except for left facial droop. Muscle strength 4/5 in left arm and leg, 5/5 in right extremities, sensation to light touch intact.  Has slurred speech Psych: Patient is not psychotic, no suicidal or hemocidal ideation.  Labs on Admission: I have personally  reviewed following labs and imaging studies  CBC: Recent Labs  Lab 03/07/24 2024  WBC 5.8  NEUTROABS 3.4  HGB 10.2*  HCT 31.2*  MCV 78.4*  PLT 132*   Basic Metabolic Panel: Recent Labs  Lab 03/07/24 2024  NA 139  K 4.0  CL 99  CO2 25  GLUCOSE 98  BUN 83*  CREATININE 7.45*  CALCIUM  8.4*   GFR: Estimated Creatinine Clearance: 9.1 mL/min (A) (by C-G formula based on SCr of 7.45 mg/dL (H)). Liver Function Tests: Recent Labs  Lab 03/07/24 2024  AST 24  ALT 17  ALKPHOS 377*  BILITOT 0.7  PROT 7.5  ALBUMIN 3.8   No results for input(s): LIPASE, AMYLASE in the last 168 hours. No results for input(s): AMMONIA in the last 168 hours. Coagulation Profile: Recent Labs  Lab 03/07/24 2024  INR 0.9   Cardiac Enzymes: No results for input(s): CKTOTAL, CKMB, CKMBINDEX, TROPONINI in the last 168 hours. BNP (last 3 results) No results for input(s): PROBNP in the last 8760 hours. HbA1C: No results for input(s): HGBA1C in the last 72 hours. CBG: Recent Labs  Lab 03/07/24 1905  GLUCAP 99   Lipid Profile: No results for input(s): CHOL, HDL, LDLCALC, TRIG, CHOLHDL, LDLDIRECT in the last 72 hours. Thyroid Function Tests: No results for input(s): TSH, T4TOTAL, FREET4, T3FREE, THYROIDAB in the last 72 hours. Anemia Panel: No results for input(s): VITAMINB12, FOLATE, FERRITIN, TIBC, IRON, RETICCTPCT in the last 72 hours. Urine analysis:    Component Value Date/Time   COLORURINE STRAW (A) 12/06/2023 0810   APPEARANCEUR CLEAR (A) 12/06/2023 0810   LABSPEC 1.008 12/06/2023 0810   PHURINE 8.0 12/06/2023 0810   GLUCOSEU NEGATIVE 12/06/2023 0810   HGBUR NEGATIVE 12/06/2023 0810   BILIRUBINUR NEGATIVE 12/06/2023 0810   KETONESUR NEGATIVE 12/06/2023 0810   PROTEINUR 30 (A) 12/06/2023 0810   UROBILINOGEN 0.2 05/24/2007 1054   NITRITE NEGATIVE 12/06/2023 0810   LEUKOCYTESUR NEGATIVE 12/06/2023 0810   Sepsis  Labs: @LABRCNTIP (procalcitonin:4,lacticidven:4) )No results found for this or any previous visit (from the past 240 hours).   Radiological Exams on Admission:   Assessment/Plan Principal Problem:   Stroke [facility]) Active Problems:   ESRD on dialysis (HCC)   HTN (hypertension)   Hypertension   Chronic diastolic CHF (congestive heart  failure) (HCC)   Thrombocytopenia   Cocaine use   Obesity (BMI 30-39.9)   Assessment and Plan:  Stroke [facility]): pt has hx of stroke with chronic left-sided weakness and left facial droop.  Today she developed new slurred speech, unsteady gait and worsening left-sided weakness, concerning for new stroke.  CT head negative.  CTA of head and neck with brain perfusion negative for LVO and acute brain ischemia.  Teleneurology is consulted, who recommended stroke workup.  -Placed on tele bed for observation - Obtain MRI-brain  - will hold oral Bp meds to allow permissive HTN  - ASA  - Statin:  lipitor - fasting lipid panel and HbA1c  - 2D transthoracic echocardiography  - swallowing screen. 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  Potassium normal. - Consulted Dr. Dennise for dialysis.  HTN (hypertension) - prn IV hydralazine  for SBP>220 or dBP>110 -Hold blood pressure medications including amlodipine , hydralazine , Imdur , irbesartan , labetalol  - Patient is clonidine  patch, will not remove clonidine  patch to avoid rebound effects  Chronic diastolic CHF (congestive heart failure) (HCC): 2D echo on 12/08/2023 showed EF 55-60%.  No leg edema.  No SOB.  CHF seems to be compensated. -Volume management per renal by dialysis  Thrombocytopenia:: This is chronic issue.  Platelet 132, no active bleeding. - Follow-up CBC  Cocaine use -Did counseling about importance of quitting substance abuse - Check UDS  Obesity (BMI 30-39.9): Patient has Obesity Class I, with body weight 84.6 Kg and BMI 33.04 kg/m2.   - Encourage losing weight - Exercise and healthy diet      DVT ppx: SQ Heparin     Code Status: Full code     Family Communication:     not done, no family member is at bed side.     Disposition Plan:  Anticipate discharge back to previous environment  Consults called:  Teleneurology, Dr. Gleda Humphreys is consulted.  Dr. Dennise of renal is consulted.  Admission status and Level of care: Telemetry Medical:    for obs     Dispo: The patient is from: Home              Anticipated d/c is to: Home              Anticipated d/c date is: 1 day              Patient currently is not medically stable to d/c.    Severity of Illness:  The appropriate patient status for this patient is OBSERVATION. Observation status is judged to be reasonable and necessary in order to provide the required intensity of service to ensure the patient's safety. The patient's presenting symptoms, physical exam findings, and initial radiographic and laboratory data in the context of their medical condition is felt to place them at decreased risk for further clinical deterioration. Furthermore, it is anticipated that the patient will be medically stable for discharge from the hospital within 2 midnights of admission.        Date of Service 03/08/2024    Caleb Exon [facility]   If 7PM-7AM, please contact night-coverage [email or web address] 03/08/2024, 12:40 AM

## 2024-03-07 NOTE — ED Provider Notes (Signed)
 [facility] Provider Note    Event Date/Time   First MD Initiated Contact with Patient 03/07/24 1926     (approximate)   History   Headache and Hypertension   HPI  Allison Hill is a 53 y.o. female   who presents to the emergency department today because of concerns for vision change.  Symptoms started about 2 hours prior to arrival.  Patient states she had felt a little odd earlier in the day.  She had had a headache earlier in the day.  She did miss her dialysis today because she was feeling unwell.  She has history of stroke and states she has history of intracranial hemorrhage.  Denies any recent illness, fevers or chills.    Physical Exam   Triage Vital Signs: ED Triage Vitals [03/07/24 1907]  Encounter Vitals Group     BP (!) 164/114     Girls Systolic BP Percentile      Girls Diastolic BP Percentile      Boys Systolic BP Percentile      Boys Diastolic BP Percentile      Pulse Rate 87     Resp 18     Temp 98.5 F (36.9 C)     Temp Source Oral     SpO2 98 %     Weight 178 lb (80.7 kg)     Height 5' 3 (1.6 m)     Head Circumference      Peak Flow      Pain Score 8     Pain Loc      Pain Education      Exclude from Growth Chart     Most recent vital signs: Vitals:   03/07/24 1930 03/07/24 1939  BP: (!) 186/116 (!) 195/127  Pulse: 82 85  Resp: 15 18  Temp:    SpO2: 100% 100%   General: Awake, alert, oriented. CV:  Good peripheral perfusion. Regular rate and rhythm. Resp:  Normal effort. Lungs clear. Abd:  No distention.   ED Results / Procedures / Treatments   Labs (all labs ordered are listed, but only abnormal results are displayed) Labs Reviewed  APTT - Abnormal; Notable for the following components:      Result Value   aPTT 46 (*)    All other components within normal limits  CBC - Abnormal; Notable for the following components:   Hemoglobin 10.2 (*)    HCT 31.2 (*)    MCV 78.4 (*)    MCH 25.6 (*)    RDW 16.8  (*)    Platelets 132 (*)    All other components within normal limits  COMPREHENSIVE METABOLIC PANEL WITH GFR - Abnormal; Notable for the following components:   BUN 83 (*)    Creatinine, Ser 7.45 (*)    Calcium  8.4 (*)    Alkaline Phosphatase 377 (*)    GFR, Estimated 6 (*)    All other components within normal limits  PROTIME-INR  DIFFERENTIAL  ETHANOL  URINE DRUG SCREEN, QUALITATIVE ([facility] ONLY)  CBG MONITORING, ED  CBG MONITORING, ED     EKG  I, Guadalupe Eagles, attending physician, personally viewed and interpreted this EKG  EKG Time: 1927 Rate: 82 Rhythm: sinus rhythm Axis: normal Intervals: qtc 506 QRS: narrow, q waves v1, v2 ST changes: no st elevation Impression: abnormal ekg   RADIOLOGY I independently interpreted and visualized the CT head. My interpretation: No ICH Radiology interpretation:  IMPRESSION:  1. No evidence of  an acute intracranial abnormality.  2. Parenchymal atrophy, advanced chronic small vessel ischemic  disease and unchanged chronic infarcts, as described within the body  of the report.  3. Trace right mastoid effusion.      PROCEDURES:  Critical Care performed: No  MEDICATIONS ORDERED IN ED: Medications  sodium chloride  flush (NS) 0.9 % injection 3 mL (has no administration in time range)  aspirin  chewable tablet 324 mg (has no administration in time range)     IMPRESSION / MDM / ASSESSMENT AND PLAN / ED COURSE  I reviewed the triage vital signs and the nursing notes.                              Differential diagnosis includes, but is not limited to, CVA, TIA, infection, electrolyte abnormality  Patient's presentation is most consistent with acute presentation with potential threat to life or bodily function.   The patient is on the cardiac monitor to evaluate for evidence of arrhythmia and/or significant heart rate changes.  Patient presented to the emergency department today because of concerns for vision change.   Also had some concerns for left-sided weakness and slurred speech.  Code stroke was called.  Neurology did evaluate the patient and would have recommended TNK except patient has history of ICH.  Patient's blood work without hyperkalemia.  Discussed with Dr. Hilma with the hospitalist service who will evaluate for admission.  FINAL CLINICAL IMPRESSION(S) / ED DIAGNOSES   Final diagnoses:  Vision changes  Facial droop       Note:  This document was prepared using Dragon voice recognition software and may include unintentional dictation errors.    Floy Roberts, MD 03/07/24 2219

## 2024-03-07 NOTE — ED Notes (Signed)
 IV team initiating IV

## 2024-03-07 NOTE — ED Notes (Signed)
 Pt in CT for perfusion scan

## 2024-03-07 NOTE — ED Triage Notes (Signed)
 Patient brought in via [facility] from [facility]. Patient states headache and blurred vision approx 2 hours ago. Missed dialysis today due to not feeling well. While speaking with patient noted to have left sided facial droop, smile does not raise and eyebrow does not raise, also having some slurred speech. Patient does have hx of previous stroke with left sided deficit, unable to determine whether she normally has facial palsy or not. ED Doc to triage to evaluate whether to activate or not.

## 2024-03-08 ENCOUNTER — Encounter: Payer: Self-pay | Admitting: Internal Medicine

## 2024-03-08 ENCOUNTER — Observation Stay

## 2024-03-08 ENCOUNTER — Observation Stay (HOSPITAL_BASED_OUTPATIENT_CLINIC_OR_DEPARTMENT_OTHER)
Admit: 2024-03-08 | Discharge: 2024-03-08 | Disposition: A | Discharge status: Home / Self Care | Attending: Internal Medicine | Admitting: Internal Medicine

## 2024-03-08 DIAGNOSIS — I69354 Hemiplegia and hemiparesis following cerebral infarction affecting left non-dominant side: Secondary | ICD-10-CM

## 2024-03-08 DIAGNOSIS — I6389 Other cerebral infarction: Secondary | ICD-10-CM | POA: Diagnosis not present

## 2024-03-08 DIAGNOSIS — N186 End stage renal disease: Secondary | ICD-10-CM | POA: Diagnosis not present

## 2024-03-08 DIAGNOSIS — G459 Transient cerebral ischemic attack, unspecified: Secondary | ICD-10-CM

## 2024-03-08 DIAGNOSIS — I10 Essential (primary) hypertension: Secondary | ICD-10-CM | POA: Diagnosis not present

## 2024-03-08 DIAGNOSIS — I5032 Chronic diastolic (congestive) heart failure: Secondary | ICD-10-CM | POA: Diagnosis not present

## 2024-03-08 DIAGNOSIS — I639 Cerebral infarction, unspecified: Secondary | ICD-10-CM | POA: Diagnosis not present

## 2024-03-08 LAB — ECHOCARDIOGRAM COMPLETE
AR max vel: 2.5 cm2
AV Area VTI: 2.31 cm2
AV Area mean vel: 2.27 cm2
AV Mean grad: 9 mmHg
AV Peak grad: 16.3 mmHg
Ao pk vel: 2.02 m/s
Calc EF: 57.6 %
Height: 63 in
MV VTI: 3.99 cm2
S' Lateral: 3.4 cm
Single Plane A2C EF: 67.9 %
Single Plane A4C EF: 43.8 %
Weight: 2712.54 [oz_av]

## 2024-03-08 LAB — CBC
HCT: 28.8 % — ABNORMAL LOW (ref 36.0–46.0)
Hemoglobin: 9.1 g/dL — ABNORMAL LOW (ref 12.0–15.0)
MCH: 25.4 pg — ABNORMAL LOW (ref 26.0–34.0)
MCHC: 31.6 g/dL (ref 30.0–36.0)
MCV: 80.4 fL (ref 80.0–100.0)
Platelets: 139 K/uL — ABNORMAL LOW (ref 150–400)
RBC: 3.58 MIL/uL — ABNORMAL LOW (ref 3.87–5.11)
RDW: 16.8 % — ABNORMAL HIGH (ref 11.5–15.5)
WBC: 5.7 K/uL (ref 4.0–10.5)
nRBC: 0 % (ref 0.0–0.2)

## 2024-03-08 LAB — LIPID PANEL
Cholesterol: 127 mg/dL (ref 0–200)
HDL: 55 mg/dL (ref >40)
LDL Cholesterol: 61 mg/dL (ref 0–99)
Total CHOL/HDL Ratio: 2.3 ratio
Triglycerides: 54 mg/dL (ref <150)
VLDL: 11 mg/dL (ref 0–40)

## 2024-03-08 LAB — URINE DRUG SCREEN, QUALITATIVE (ARMC ONLY)
Amphetamines, Ur Screen: NOT DETECTED
Barbiturates, Ur Screen: NOT DETECTED
Benzodiazepine, Ur Scrn: NOT DETECTED
Cannabinoid 50 Ng, Ur ~~LOC~~: NOT DETECTED
Cocaine Metabolite,Ur ~~LOC~~: NOT DETECTED
MDMA (Ecstasy)Ur Screen: NOT DETECTED
Methadone Scn, Ur: NOT DETECTED
Opiate, Ur Screen: NOT DETECTED
Phencyclidine (PCP) Ur S: NOT DETECTED
Tricyclic, Ur Screen: NOT DETECTED

## 2024-03-08 LAB — RENAL FUNCTION PANEL
Albumin: 3.4 g/dL — ABNORMAL LOW (ref 3.5–5.0)
Anion gap: 14 (ref 5–15)
BUN: 87 mg/dL — ABNORMAL HIGH (ref 6–20)
CO2: 24 mmol/L (ref 22–32)
Calcium: 7.5 mg/dL — ABNORMAL LOW (ref 8.9–10.3)
Chloride: 101 mmol/L (ref 98–111)
Creatinine, Ser: 8.31 mg/dL — ABNORMAL HIGH (ref 0.44–1.00)
GFR, Estimated: 5 mL/min — ABNORMAL LOW (ref >60)
Glucose, Bld: 81 mg/dL (ref 70–99)
Phosphorus: 3.5 mg/dL (ref 2.5–4.6)
Potassium: 4.1 mmol/L (ref 3.5–5.1)
Sodium: 139 mmol/L (ref 135–145)

## 2024-03-08 LAB — HEMOGLOBIN A1C
Hgb A1c MFr Bld: 5.8 % — ABNORMAL HIGH (ref 4.8–5.6)
Mean Plasma Glucose: 119.76 mg/dL

## 2024-03-08 LAB — HEPATITIS B SURFACE ANTIGEN: Hepatitis B Surface Ag: NONREACTIVE

## 2024-03-08 MED ORDER — CALCITRIOL 0.25 MCG PO CAPS
0.7500 ug | ORAL_CAPSULE | ORAL | Status: DC
  Administered 2024-03-09 – 2024-03-10 (×2): 0.75 ug via ORAL
  Filled 2024-03-08 (×4): qty 3

## 2024-03-08 MED ORDER — POLYETHYLENE GLYCOL 3350 17 G PO PACK
17.0000 g | PACK | Freq: Every day | ORAL | Status: DC
  Administered 2024-03-08: 17 g via ORAL
  Filled 2024-03-08 (×2): qty 1

## 2024-03-08 MED ORDER — IRBESARTAN 150 MG PO TABS
300.0000 mg | ORAL_TABLET | Freq: Every day | ORAL | Status: DC
  Administered 2024-03-08 – 2024-03-10 (×3): 300 mg via ORAL
  Filled 2024-03-08 (×3): qty 2

## 2024-03-08 MED ORDER — PANTOPRAZOLE SODIUM 40 MG PO TBEC
40.0000 mg | DELAYED_RELEASE_TABLET | Freq: Every day | ORAL | Status: DC
  Administered 2024-03-08 – 2024-03-10 (×3): 40 mg via ORAL
  Filled 2024-03-08 (×3): qty 1

## 2024-03-08 MED ORDER — CLOPIDOGREL BISULFATE 75 MG PO TABS: 75.0000 mg | ORAL_TABLET | Freq: Every day | ORAL | Status: DC

## 2024-03-08 MED ORDER — CHLORHEXIDINE GLUCONATE CLOTH 2 % EX PADS
6.0000 | MEDICATED_PAD | Freq: Every day | CUTANEOUS | Status: DC
  Administered 2024-03-09 – 2024-03-10 (×2): 6 via TOPICAL
  Filled 2024-03-08: qty 6

## 2024-03-08 MED ORDER — SEVELAMER CARBONATE 800 MG PO TABS
1600.0000 mg | ORAL_TABLET | Freq: Three times a day (TID) | ORAL | Status: DC
  Administered 2024-03-08 – 2024-03-10 (×8): 1600 mg via ORAL
  Filled 2024-03-08 (×9): qty 2

## 2024-03-08 MED ORDER — ALTEPLASE 2 MG IJ SOLR: 2.0000 mg | Freq: Once | INTRAMUSCULAR | Status: DC | PRN

## 2024-03-08 MED ORDER — LORAZEPAM 2 MG/ML IJ SOLN
1.0000 mg | Freq: Once | INTRAMUSCULAR | Status: AC
  Administered 2024-03-08: 1 mg via INTRAVENOUS
  Filled 2024-03-08: qty 1

## 2024-03-08 MED ORDER — HEPARIN SODIUM (PORCINE) 1000 UNIT/ML DIALYSIS: 1000.0000 [IU] | INTRAMUSCULAR | Status: DC | PRN

## 2024-03-08 MED ORDER — ATORVASTATIN CALCIUM 20 MG PO TABS
40.0000 mg | ORAL_TABLET | Freq: Every evening | ORAL | Status: DC
  Administered 2024-03-08 – 2024-03-09 (×2): 40 mg via ORAL
  Filled 2024-03-08 (×2): qty 2

## 2024-03-08 MED ORDER — MELATONIN 5 MG PO TABS
5.0000 mg | ORAL_TABLET | Freq: Every day | ORAL | Status: DC
  Administered 2024-03-08 – 2024-03-09 (×2): 5 mg via ORAL
  Filled 2024-03-08 (×3): qty 1

## 2024-03-08 MED ORDER — GABAPENTIN 100 MG PO CAPS
200.0000 mg | ORAL_CAPSULE | Freq: Three times a day (TID) | ORAL | Status: DC
  Administered 2024-03-08 – 2024-03-10 (×8): 200 mg via ORAL
  Filled 2024-03-08 (×9): qty 2

## 2024-03-08 MED ORDER — HYDRALAZINE HCL 50 MG PO TABS
50.0000 mg | ORAL_TABLET | Freq: Three times a day (TID) | ORAL | Status: DC
  Administered 2024-03-08 – 2024-03-09 (×2): 50 mg via ORAL
  Filled 2024-03-08 (×2): qty 1

## 2024-03-08 MED ORDER — SENNOSIDES-DOCUSATE SODIUM 8.6-50 MG PO TABS
2.0000 | ORAL_TABLET | Freq: Two times a day (BID) | ORAL | Status: DC
  Administered 2024-03-08 (×2): 2 via ORAL
  Filled 2024-03-08 (×3): qty 2

## 2024-03-08 MED ORDER — CLONIDINE HCL 0.3 MG/24HR TD PTWK: 0.3000 mg | MEDICATED_PATCH | TRANSDERMAL | Status: DC

## 2024-03-08 MED ORDER — CLOPIDOGREL BISULFATE 75 MG PO TABS
75.0000 mg | ORAL_TABLET | Freq: Every day | ORAL | Status: DC
  Administered 2024-03-08 – 2024-03-10 (×3): 75 mg via ORAL
  Filled 2024-03-08 (×3): qty 1

## 2024-03-08 MED ORDER — ASPIRIN 325 MG PO TBEC
325.0000 mg | DELAYED_RELEASE_TABLET | Freq: Every day | ORAL | Status: DC
  Administered 2024-03-08 – 2024-03-10 (×3): 325 mg via ORAL
  Filled 2024-03-08 (×3): qty 1

## 2024-03-08 MED ORDER — AMLODIPINE BESYLATE 10 MG PO TABS
10.0000 mg | ORAL_TABLET | Freq: Every day | ORAL | Status: DC
  Administered 2024-03-08 – 2024-03-10 (×3): 10 mg via ORAL
  Filled 2024-03-08 (×3): qty 1

## 2024-03-08 MED ORDER — HEPARIN SODIUM (PORCINE) 1000 UNIT/ML IJ SOLN
INTRAMUSCULAR | Status: AC
Start: 2024-03-08 — End: 2024-03-08
  Filled 2024-03-08: qty 4

## 2024-03-08 MED ORDER — CINACALCET HCL 30 MG PO TABS
60.0000 mg | ORAL_TABLET | Freq: Every day | ORAL | Status: DC
Start: 2024-03-08 — End: 2024-03-10
  Administered 2024-03-08 – 2024-03-10 (×3): 60 mg via ORAL
  Filled 2024-03-08 (×3): qty 2

## 2024-03-08 NOTE — Plan of Care (Signed)
  Problem: Education: Goal: Knowledge of disease or condition will improve Outcome: Progressing Goal: Knowledge of secondary prevention will improve (MUST DOCUMENT ALL) Outcome: Progressing Goal: Knowledge of patient specific risk factors will improve (DELETE if not current risk factor) Outcome: Progressing   Problem: Ischemic Stroke/TIA Tissue Perfusion: Goal: Complications of ischemic stroke/TIA will be minimized Outcome: Progressing   Problem: Coping: Goal: Will verbalize positive feelings about self Outcome: Progressing Goal: Will identify appropriate support needs Outcome: Progressing   Problem: Health Behavior/Discharge Planning: Goal: Ability to manage health-related needs will improve Outcome: Progressing Goal: Goals will be collaboratively established with patient/family Outcome: Progressing   Problem: Self-Care: Goal: Ability to participate in self-care as condition permits will improve Outcome: Progressing Goal: Verbalization of feelings and concerns over difficulty with self-care will improve Outcome: Progressing Goal: Ability to communicate needs accurately will improve Outcome: Progressing   Problem: Nutrition: Goal: Risk of aspiration will decrease Outcome: Progressing Goal: Dietary intake will improve Outcome: Progressing   Problem: Education: Goal: Knowledge of General Education information will improve Description: Including pain rating scale, medication(s)/side effects and non-pharmacologic comfort measures Outcome: Progressing   Problem: Health Behavior/Discharge Planning: Goal: Ability to manage health-related needs will improve Outcome: Progressing   Problem: Clinical Measurements: Goal: Ability to maintain clinical measurements within normal limits will improve Outcome: Progressing Goal: Will remain free from infection Outcome: Progressing Goal: Diagnostic test results will improve Outcome: Progressing Goal: Respiratory complications will  improve Outcome: Progressing Goal: Cardiovascular complication will be avoided Outcome: Progressing   Problem: Activity: Goal: Risk for activity intolerance will decrease Outcome: Progressing   Problem: Nutrition: Goal: Adequate nutrition will be maintained Outcome: Progressing   Problem: Coping: Goal: Level of anxiety will decrease Outcome: Progressing   Problem: Elimination: Goal: Will not experience complications related to bowel motility Outcome: Progressing Goal: Will not experience complications related to urinary retention Outcome: Progressing   Problem: Pain Managment: Goal: General experience of comfort will improve and/or be controlled Outcome: Progressing   Problem: Safety: Goal: Ability to remain free from injury will improve Outcome: Progressing   Problem: Skin Integrity: Goal: Risk for impaired skin integrity will decrease Outcome: Progressing   Problem: Education: Goal: Knowledge of General Education information will improve Description: Including pain rating scale, medication(s)/side effects and non-pharmacologic comfort measures Outcome: Progressing

## 2024-03-08 NOTE — Progress Notes (Signed)
 OT Cancellation Note  Patient Details Name: Allison Hill MRN: [id number] DOB: [birth year]   Cancelled Treatment:    Reason Eval/Treat Not Completed: Other (comment) (per chart, pt OTF at HD, OT will re attempt as able)  Therisa Sheffield, OTD OTR/L  03/08/24, 1:42 PM

## 2024-03-08 NOTE — ED Notes (Signed)
 Patient transported to MRI

## 2024-03-08 NOTE — Progress Notes (Signed)
 SLP Cancellation Note  Patient Details Name: [name] MRN: [id number] DOB: [birth year]   Cancelled treatment:       Reason Eval/Treat Not Completed: Patient at procedure or test/unavailable (Pt OTF in HD. Will contine efforts as appropriate.)  Delon Bangs, M.S., CCC-SLP Speech-Language Pathologist [facility] [phone] [place])  Delon CHRISTELLA Bangs 03/08/2024, 1:06 PM

## 2024-03-08 NOTE — ED Notes (Signed)
 Patient stated she did not want her soiled diaper changed at this time.

## 2024-03-08 NOTE — Progress Notes (Signed)
 [facility]  -  at [facility]   PATIENT NAME: Allison Hill    MR#:  [id number]  DATE OF BIRTH:  1971/01/14  SUBJECTIVE:  no family at bedside. Patient seen earlier at dialysis. Overall feels back to baseline with her speech. She told me she missed dialysis on Monday and she is here for volume overload. No respiratory distress. Patient has chronic left-sided weakness. Worked with PT at the facility.    VITALS:  Blood pressure (!) 179/115, pulse 84, temperature 98.7 F (37.1 C), temperature source Oral, resp. rate 17, height 5' 3 (1.6 m), weight 76.9 kg, SpO2 99%.  PHYSICAL EXAMINATION:   GENERAL:  53 y.o.-year-old patient with no acute distress. Obese LUNGS: Normal breath sounds bilaterally, no wheezing CARDIOVASCULAR: S1, S2 normal. No murmur   ABDOMEN: Soft, nontender, nondistended. Bowel sounds present.  EXTREMITIES: No  edema b/l.   HD Access NEUROLOGIC: nonfocal  patient is alert and awake speech clear. Minimal left upper and lower extremity weakness which is chronic   LABORATORY PANEL:  CBC Recent Labs  Lab 03/08/24 1300  WBC 5.7  HGB 9.1*  HCT 28.8*  PLT 139*    Chemistries  Recent Labs  Lab 03/07/24 2024  NA 139  K 4.0  CL 99  CO2 25  GLUCOSE 98  BUN 83*  CREATININE 7.45*  CALCIUM  8.4*  AST 24  ALT 17  ALKPHOS 377*  BILITOT 0.7   Cardiac Enzymes No results for input(s): TROPONINI in the last 168 hours. RADIOLOGY:  MR BRAIN WO CONTRAST Result Date: 03/08/2024 EXAM: MRI BRAIN WITHOUT CONTRAST 03/08/2024 02:33:09 AM TECHNIQUE: Multiplanar multisequence MRI of the head/brain was performed without the administration of intravenous contrast. COMPARISON: 06/11/2023 CLINICAL HISTORY: Acute neuro deficit, stroke suspected. 53 year old female with history of intracranial hemorrhage, stroke with left-sided weakness, hypertension, decompensated congestive heart failure, thrombocytopenia, end-stage renal disease on hemodialysis,  vertigo, obesity, former smoker, and cocaine abuse, presenting with difficulty speaking and unsteady gait. FINDINGS: BRAIN AND VENTRICLES: No acute infarct, intracranial hemorrhage, mass, midline shift, or hydrocephalus. Numerous chronic microhemorrhages in a predominantly central distribution consistent with chronic hypertensive angiopathy. Multifocal hyperintense T2-weighted signal within the cerebral white matter, most commonly due to chronic small vessel ischemic disease. Multiple small vessel infarcts of the deep gray nuclei. The sella is unremarkable. Normal flow voids. ORBITS: No acute abnormality. SINUSES AND MASTOIDS: Right mastoid effusion. BONES AND SOFT TISSUES: Normal marrow signal. No acute soft tissue abnormality. IMPRESSION: 1. No acute intracranial abnormality. 2. Findings consistent with chronic hypertensive angiopathy and chronic small vessel ischemic disease, with numerous chronic microhemorrhages and multifocal white matter T2 hyperintensities. 3. Multiple chronic small vessel infarcts involving the deep gray nuclei. Electronically signed by: Franky Stanford MD 03/08/2024 03:07 AM EDT RP Workstation: [id number]   CT ANGIO HEAD NECK W WO CM W PERF (CODE STROKE) Result Date: 03/07/2024 EXAM: CTA Head and Neck with Perfusion 03/07/2024 08:58:26 PM TECHNIQUE: CTA of the head and neck was performed without and with the administration of 100 mL of iohexol  (OMNIPAQUE ) 350 MG/ML injection. 3D postprocessing with multiplanar reconstructions and MIPs was performed to evaluate the vascular anatomy. Cerebral perfusion analysis using computed tomography with contrast administration, including post-processing of parametric maps with determination of cerebral blood flow, cerebral blood volume, mean transit time and time-to-maximum. Automated exposure control, iterative reconstruction, and/or weight based adjustment of the mA/kV was utilized to reduce the radiation dose to as low as reasonably achievable.  COMPARISON: 01/31/2024 CLINICAL HISTORY: Neuro deficit, acute, stroke suspected.  Code stroke. FINDINGS: CTA NECK: AORTIC ARCH AND ARCH VESSELS: Calcific aortic atherosclerosis. No dissection or arterial injury. No significant stenosis of the brachiocephalic or subclavian arteries. CERVICAL CAROTID ARTERIES: No dissection, arterial injury, or hemodynamically significant stenosis by NASCET criteria. CERVICAL VERTEBRAL ARTERIES: No dissection, arterial injury, or significant stenosis. LUNGS AND MEDIASTINUM: Unremarkable. SOFT TISSUES: No acute abnormality. BONES: No acute abnormality. CTA HEAD: ANTERIOR CIRCULATION: No significant stenosis of the internal, anterior, or middle cerebral arteries. No aneurysm. POSTERIOR CIRCULATION: No significant stenosis of the posterior cerebral, basilar, or vertebral arteries. No aneurysm. OTHER: No dural venous sinus thrombosis on this non-dedicated study. CT PERFUSION: EXAM QUALITY: Exam quality is adequate with diagnostic perfusion maps. No significant motion artifact. Appropriate arterial inflow and venous outflow curves. CORE INFARCT (CBF<30% volume): 0 mL TOTAL HYPOPERFUSION (Tmax>6s volume): 0 mL PENUMBRA: Mismatch volume: 0 mL Mismatch ratio: not applicable Location: not applicable IMPRESSION: 1. No evidence of ischemia by CT brain perfusion. 2. No acute large vessel occlusion. 3. No hemodynamically significant stenosis or aneurysm in the head or neck vessels. Electronically signed by: Franky Stanford MD 03/07/2024 09:07 PM EDT RP Workstation: [id number]   CT HEAD CODE STROKE WO CONTRAST Addendum Date: 03/07/2024 ADDENDUM REPORT: 03/07/2024 19:36 ADDENDUM: No evidence of an acute intracranial abnormality. These results were called by telephone on 03/07/2024 at 7:36 pm to provider MICHAEL MIAN , who verbally acknowledged these results. Electronically Signed   By: Rockey Childs D.O.   On: 03/07/2024 19:36   Result Date: 03/07/2024 CLINICAL DATA:  Code stroke. Neuro deficit,  acute, stroke suspected. EXAM: CT HEAD WITHOUT CONTRAST TECHNIQUE: Contiguous axial images were obtained from the base of the skull through the vertex without intravenous contrast. RADIATION DOSE REDUCTION: This exam was performed according to the departmental dose-optimization program which includes automated exposure control, adjustment of the mA and/or kV according to patient size and/or use of iterative reconstruction technique. COMPARISON:  Brain MRI 02/01/2024. Head CT 01/31/2024. FINDINGS: Brain: Mild generalized parenchymal atrophy. Known small chronic cortically-based infarcts within the left parietal lobe and at the right temporo-occipital junction Known chronic lacunar infarcts within the bilateral cerebral hemispheric white matter, within/about the bilateral deep gray nuclei and within the left aspect of the pons, some of which were better appreciated on the prior brain MRI of 02/01/2024. Background age-advanced cerebral white matter chronic small vessel ischemic disease. The tiny chronic cerebellar infarcts demonstrated on the prior MRI are occult by CT. There is no acute intracranial hemorrhage. No acute demarcated cortical infarct. No extra-axial fluid collection. No evidence of an intracranial mass. No midline shift. Vascular: No hyperdense vessel. Atherosclerotic calcifications. Skull: No calvarial fracture or aggressive osseous lesion. Sinuses/Orbits: No mass or acute finding within the imaged orbits. No significant paranasal sinus disease at the imaged levels. Other: Trace fluid within right mastoid air cells. ASPECTS [facility] Stroke Program Early CT Score) - Ganglionic level infarction (caudate, lentiform nuclei, internal capsule, insula, M1-M3 cortex): 7 - Supraganglionic infarction (M4-M6 cortex): 3 Total score (0-10 with 10 being normal): 10 (when discounting chronic infarcts). Attempts are being made to reach the ordering provider at this time. IMPRESSION: 1. No evidence of an acute  intracranial abnormality. 2. Parenchymal atrophy, advanced chronic small vessel ischemic disease and unchanged chronic infarcts, as described within the body of the report. 3. Trace right mastoid effusion. Electronically Signed: By: Rockey Childs D.O. On: 03/07/2024 19:33    Assessment and Plan  Allison Hill is a 53 y.o. female with medical history significant of ICH, stroke  with left-sided weakness, HTN, dCHF, thrombocytopenia, ESRD-HD ([facility]), vertigo, obesity, former smoker, cocaine abuse, who presents with difficulty speaking, unsteady gait.   Patient states she had 2 strokes in the past with left-sided weakness and left facial droop.  At about 17:30, she developed difficulty speaking and worsening left weakness, with unsteady gait.  No numbness or tingling in extremities Pt missed HD on Monday  CTA of head and neck with with perfusion 1. No evidence of ischemia by CT brain perfusion. 2. No acute large vessel occlusion. 3. No hemodynamically significant stenosis or aneurysm in the head or neck vessels  MRI brain No acute intracranial abnormality. 2. Findings consistent with chronic hypertensive angiopathy and chronic small vessel ischemic disease, with numerous chronic microhemorrhages and multifocal white matter T2 hyperintensities. 3. Multiple chronic small vessel infarcts involving the deep gray nuclei.  Dysarthria with h/o Stroke [facility]): -- pt has hx of stroke with chronic left-sided weakness and left facial droop.   -- she developed new slurred speech, unsteady gait and worsening left-sided weakness, concerning for new stroke.  CT head negative.  CTA of head and neck with brain perfusion negative for LVO and acute brain ischemia.  Teleneurology is consulted, who recommended stroke workup.  - ASA  - Statin:  lipitor -- PT/OT consult --Neurology Dr [facility] aware --pt is at baseline   ESRD on dialysis ([facility]): had dialysis on Friday. Missed on Monday -- Potassium normal. - Consulted  Dr. Dennise for dialysis.   HTN (hypertension) - prn IV hydralazine  for SBP>220 or dBP>110 -Hold blood pressure medications including amlodipine , hydralazine , Imdur , irbesartan , labetalol  - Patient is clonidine  patch, will not remove clonidine  patch to avoid rebound effects   Chronic diastolic CHF (congestive heart failure) (HCC): 2D echo on 12/08/2023 showed EF 55-60%.  No leg edema.  No SOB.  CHF seems to be compensated. -Volume management per renal by dialysis   Thrombocytopenia:: -- This is chronic issue.  Platelet 132, no active bleeding.  H/o Cocaine use - Check UDS--negative   Obesity (BMI 30-39.9): Patient has Obesity Class I, with body weight 84.6 Kg and BMI 33.04 kg/m2.  - Encourage losing weight - Exercise and healthy diet  Procedures: Family communication :none today Consults :Neurology, nephrology CODE STATUS: full DVT Prophylaxis :Heparin  Level of care: Telemetry Medical Status is: Observation The patient remains OBS appropriate and will d/c before 2 midnights.    TOTAL TIME TAKING CARE OF THIS PATIENT: 40 minutes.  >50% time spent on counselling and coordination of care  Note: This dictation was prepared with Dragon dictation along with smaller phrase technology. Any transcriptional errors that result from this process are unintentional.  Leita Blanch M.D    [facility]   CC: Primary care physician; Patient, No Pcp Per

## 2024-03-08 NOTE — Progress Notes (Signed)
 [facility]  ROUNDING NOTE   Subjective:   Allison Hill is a 53 y.o. female with past medical history of ESRD on HD, hypertension, congestive heart failure, migraine headaches, history of intracranial hemorrhage, cocaine abuse, sickle cell trait who was admitted for Facial droop [R29.810] Stroke [facility]) [I63.9] Vision changes [H53.9]  Patient is known to our practice and is a resident at [facility], where she receives her dialysis. Her last dialysis treatment was on Friday. She states she presented with headache and BP. She has presented with these concerns in the past. She missed dialysis on Monday due to ED presentation. Seen sitting up in bed. Room air. Speech intact.   Labs unremarkable from renal stance.  Brain MRI negative for acute findings.  Echo pending.  We have been consulted to manage dialysis needs.  Objective:  Vital signs in last 24 hours:  Temp:  [97.6 F (36.4 C)-98.7 F (37.1 C)] 98.7 F (37.1 C) (10/07 1230) Pulse Rate:  [82-89] 83 (10/07 1330) Resp:  [9-20] 18 (10/07 1330) BP: (126-195)/(86-127) 188/113 (10/07 1330) SpO2:  [92 %-100 %] 100 % (10/07 1330) Weight:  [76.9 kg-84.6 kg] 76.9 kg (10/07 1230)  Weight change:  Filed Weights   03/07/24 1907 03/07/24 1932 03/08/24 1230  Weight: 80.7 kg 84.6 kg 76.9 kg    Intake/Output: I/O last 3 completed shifts: In: 10 [I.V.:10] Out: 400 [Urine:400]   Intake/Output this shift:  No intake/output data recorded.  Physical Exam: General: NAD  Head: Normocephalic, atraumatic. Moist oral mucosal membranes  Eyes: Anicteric  Neck: Supple  Lungs:  Clear to auscultation, room air  Heart: Regular rate and rhythm  Abdomen:  Soft, nontender  Extremities: Trace peripheral edema.  Neurologic: Awake, alert, conversant  Skin: Warm,dry, no rash  Access: Right IJ PermCath    Basic Metabolic Panel: Recent Labs  Lab 03/07/24 2024  NA 139  K 4.0  CL 99  CO2 25  GLUCOSE 98  BUN 83*  CREATININE  7.45*  CALCIUM  8.4*    Liver Function Tests: Recent Labs  Lab 03/07/24 2024  AST 24  ALT 17  ALKPHOS 377*  BILITOT 0.7  PROT 7.5  ALBUMIN 3.8   No results for input(s): LIPASE, AMYLASE in the last 168 hours. No results for input(s): AMMONIA in the last 168 hours.  CBC: Recent Labs  Lab 03/07/24 2024  WBC 5.8  NEUTROABS 3.4  HGB 10.2*  HCT 31.2*  MCV 78.4*  PLT 132*    Cardiac Enzymes: No results for input(s): CKTOTAL, CKMB, CKMBINDEX, TROPONINI in the last 168 hours.  BNP: Invalid input(s): POCBNP  CBG: Recent Labs  Lab 03/07/24 1905  GLUCAP 99    Microbiology: Results for orders placed or performed during the hospital encounter of 01/31/24  MRSA Next Gen by PCR, Nasal     Status: Abnormal   Collection Time: 01/31/24  1:13 PM   Specimen: Nasal Mucosa; Nasal Swab  Result Value Ref Range Status   MRSA by PCR Next Gen DETECTED (A) NOT DETECTED Final    Comment: CRITICAL RESULT CALLED TO, READ BACK BY AND VERIFIED WITH: KATHERINE CLAYTON 01/31/24 1517 SLM (NOTE) The GeneXpert MRSA Assay (FDA approved for NASAL specimens only), is one component of a comprehensive MRSA colonization surveillance program. It is not intended to diagnose MRSA infection nor to guide or monitor treatment for MRSA infections. Test performance is not FDA approved in patients less than 80 years old. Performed at [facility], [address] [place], [place]  [address]     Coagulation Studies: Recent Labs    03/07/24 04/09/2023  LABPROT 13.0  INR 0.9    Urinalysis: No results for input(s): COLORURINE, LABSPEC, PHURINE, GLUCOSEU, HGBUR, BILIRUBINUR, KETONESUR, PROTEINUR, UROBILINOGEN, NITRITE, LEUKOCYTESUR in the last 72 hours.  Invalid input(s): APPERANCEUR    Imaging: MR BRAIN WO CONTRAST Result Date: 03/08/2024 EXAM: MRI BRAIN WITHOUT CONTRAST 03/08/2024 02:33:09 AM TECHNIQUE: Multiplanar multisequence MRI of the head/brain  was performed without the administration of intravenous contrast. COMPARISON: 06/11/2023 CLINICAL HISTORY: Acute neuro deficit, stroke suspected. 53 year old female with history of intracranial hemorrhage, stroke with left-sided weakness, hypertension, decompensated congestive heart failure, thrombocytopenia, end-stage renal disease on hemodialysis, vertigo, obesity, former smoker, and cocaine abuse, presenting with difficulty speaking and unsteady gait. FINDINGS: BRAIN AND VENTRICLES: No acute infarct, intracranial hemorrhage, mass, midline shift, or hydrocephalus. Numerous chronic microhemorrhages in a predominantly central distribution consistent with chronic hypertensive angiopathy. Multifocal hyperintense T2-weighted signal within the cerebral white matter, most commonly due to chronic small vessel ischemic disease. Multiple small vessel infarcts of the deep gray nuclei. The sella is unremarkable. Normal flow voids. ORBITS: No acute abnormality. SINUSES AND MASTOIDS: Right mastoid effusion. BONES AND SOFT TISSUES: Normal marrow signal. No acute soft tissue abnormality. IMPRESSION: 1. No acute intracranial abnormality. 2. Findings consistent with chronic hypertensive angiopathy and chronic small vessel ischemic disease, with numerous chronic microhemorrhages and multifocal white matter T2 hyperintensities. 3. Multiple chronic small vessel infarcts involving the deep gray nuclei. Electronically signed by: Franky Stanford MD 03/08/2024 03:07 AM EDT RP Workstation: [id number]   CT ANGIO HEAD NECK W WO CM W PERF (CODE STROKE) Result Date: 03/07/2024 EXAM: CTA Head and Neck with Perfusion 03/07/2024 08:58:26 PM TECHNIQUE: CTA of the head and neck was performed without and with the administration of 100 mL of iohexol  (OMNIPAQUE ) 350 MG/ML injection. 3D postprocessing with multiplanar reconstructions and MIPs was performed to evaluate the vascular anatomy. Cerebral perfusion analysis using computed tomography with  contrast administration, including post-processing of parametric maps with determination of cerebral blood flow, cerebral blood volume, mean transit time and time-to-maximum. Automated exposure control, iterative reconstruction, and/or weight based adjustment of the mA/kV was utilized to reduce the radiation dose to as low as reasonably achievable. COMPARISON: 01/31/2024 CLINICAL HISTORY: Neuro deficit, acute, stroke suspected. Code stroke. FINDINGS: CTA NECK: AORTIC ARCH AND ARCH VESSELS: Calcific aortic atherosclerosis. No dissection or arterial injury. No significant stenosis of the brachiocephalic or subclavian arteries. CERVICAL CAROTID ARTERIES: No dissection, arterial injury, or hemodynamically significant stenosis by NASCET criteria. CERVICAL VERTEBRAL ARTERIES: No dissection, arterial injury, or significant stenosis. LUNGS AND MEDIASTINUM: Unremarkable. SOFT TISSUES: No acute abnormality. BONES: No acute abnormality. CTA HEAD: ANTERIOR CIRCULATION: No significant stenosis of the internal, anterior, or middle cerebral arteries. No aneurysm. POSTERIOR CIRCULATION: No significant stenosis of the posterior cerebral, basilar, or vertebral arteries. No aneurysm. OTHER: No dural venous sinus thrombosis on this non-dedicated study. CT PERFUSION: EXAM QUALITY: Exam quality is adequate with diagnostic perfusion maps. No significant motion artifact. Appropriate arterial inflow and venous outflow curves. CORE INFARCT (CBF<30% volume): 0 mL TOTAL HYPOPERFUSION (Tmax>6s volume): 0 mL PENUMBRA: Mismatch volume: 0 mL Mismatch ratio: not applicable Location: not applicable IMPRESSION: 1. No evidence of ischemia by CT brain perfusion. 2. No acute large vessel occlusion. 3. No hemodynamically significant stenosis or aneurysm in the head or neck vessels. Electronically signed by: Franky Stanford MD 03/07/2024 09:07 PM EDT RP Workstation: [id number]   CT HEAD CODE STROKE WO CONTRAST Addendum Date: 03/07/2024 ADDENDUM REPORT:  03/07/2024  19:36 ADDENDUM: No evidence of an acute intracranial abnormality. These results were called by telephone on 03/07/2024 at 7:36 pm to provider MICHAEL MIAN , who verbally acknowledged these results. Electronically Signed   By: Rockey Childs D.O.   On: 03/07/2024 19:36   Result Date: 03/07/2024 CLINICAL DATA:  Code stroke. Neuro deficit, acute, stroke suspected. EXAM: CT HEAD WITHOUT CONTRAST TECHNIQUE: Contiguous axial images were obtained from the base of the skull through the vertex without intravenous contrast. RADIATION DOSE REDUCTION: This exam was performed according to the departmental dose-optimization program which includes automated exposure control, adjustment of the mA and/or kV according to patient size and/or use of iterative reconstruction technique. COMPARISON:  Brain MRI 02/01/2024. Head CT 01/31/2024. FINDINGS: Brain: Mild generalized parenchymal atrophy. Known small chronic cortically-based infarcts within the left parietal lobe and at the right temporo-occipital junction Known chronic lacunar infarcts within the bilateral cerebral hemispheric white matter, within/about the bilateral deep gray nuclei and within the left aspect of the pons, some of which were better appreciated on the prior brain MRI of 02/01/2024. Background age-advanced cerebral white matter chronic small vessel ischemic disease. The tiny chronic cerebellar infarcts demonstrated on the prior MRI are occult by CT. There is no acute intracranial hemorrhage. No acute demarcated cortical infarct. No extra-axial fluid collection. No evidence of an intracranial mass. No midline shift. Vascular: No hyperdense vessel. Atherosclerotic calcifications. Skull: No calvarial fracture or aggressive osseous lesion. Sinuses/Orbits: No mass or acute finding within the imaged orbits. No significant paranasal sinus disease at the imaged levels. Other: Trace fluid within right mastoid air cells. ASPECTS [facility] Stroke Program Early CT  Score) - Ganglionic level infarction (caudate, lentiform nuclei, internal capsule, insula, M1-M3 cortex): 7 - Supraganglionic infarction (M4-M6 cortex): 3 Total score (0-10 with 10 being normal): 10 (when discounting chronic infarcts). Attempts are being made to reach the ordering provider at this time. IMPRESSION: 1. No evidence of an acute intracranial abnormality. 2. Parenchymal atrophy, advanced chronic small vessel ischemic disease and unchanged chronic infarcts, as described within the body of the report. 3. Trace right mastoid effusion. Electronically Signed: By: Rockey Childs D.O. On: 03/07/2024 19:33     Medications:    sodium chloride  40 mL/hr at 03/08/24 0907     stroke: early stages of recovery book   Does not apply Once   aspirin  EC  325 mg Oral Daily   atorvastatin   40 mg Oral QPM   calcitRIOL   0.75 mcg Oral Q MTWThF   Chlorhexidine  Gluconate Cloth  6 each Topical Q0600   gabapentin   200 mg Oral TID   heparin   5,000 Units Subcutaneous Q8H   melatonin  5 mg Oral QHS   pantoprazole   40 mg Oral Daily   polyethylene glycol  17 g Oral Daily   senna-docusate  2 tablet Oral BID   sevelamer  carbonate  1,600 mg Oral TID WC   acetaminophen  **OR** acetaminophen  (TYLENOL ) oral liquid 160 mg/5 mL **OR** acetaminophen , alteplase , diphenhydrAMINE , heparin , hydrALAZINE   Assessment/ Plan:  Ms. Arriah Wadle is a 53 y.o.  female with past medical history of ESRD on HD, hypertension, congestive heart failure, migraine headaches, history of intracranial hemorrhage, cocaine abuse, sickle cell trait who was admitted for Facial droop [R29.810] Stroke [facility]) [I63.9] Vision changes [H53.9]   Hypertensive urgency, 164/114 on ED arrival.  Home regimen includes amlodipine , clonidine , hydralazine , irbesartan , isosorbide , and labetalol .  Blood pressure remains elevated.  Antihypertensives held at this time.  IV hydralazine  available as needed.  2.  End-stage renal disease on hemodialysis.  Last  treatment received on Friday.  Will plan for dialysis treatment later today.  3. Anemia of chronic kidney disease Lab Results  Component Value Date   HGB 9.1 (L) 03/08/2024    Hemoglobin borderline.  Due to elevated blood pressure, will hold ESA's at this time.  4. Secondary Hyperparathyroidism: with outpatient labs: PTH 1767, phosphorus 4.4, calcium  8.5 on 9/10.   Lab Results  Component Value Date   PTH 1,248 (H) 08/11/2023   CALCIUM  8.4 (L) 03/07/2024   PHOS 4.6 02/01/2024    Patient prescribed calcium  carbonate outpatient.  Will continue to monitor bone minerals.   LOS: 0 Miron Marxen 10/7/20251:45 PM

## 2024-03-08 NOTE — Consult Note (Signed)
 NEUROLOGY CONSULT NOTE   Date of service: March 08, 2024 Patient Name: Allison Hill MRN:  [id number] DOB:  [birth year] Chief Complaint: left sided weakness Requesting Provider: Tobie Calix, MD  History of Present Illness  Allison Hill is a 53 y.o. female with hx of ESRD, previous strokes, left-sided weakness, who presented with left-sided weakness last night.  She was seen by teleneurology who recommended admission for stroke workup.  Since that time, her symptoms have improved.  She states that her left-sided weakness always gets worse if her blood pressure goes up or if she needs dialysis she states that it gets worse whenever she feels bad.  She is back to baseline currently  Past History   Past Medical History:  Diagnosis Date   CHF (congestive heart failure) (HCC)    Hypertension    Renal disorder    ESRD on dialysis   Stroke [facility])    L hemiparesis, 2015, 2023    History reviewed. No pertinent surgical history.  Family History: Family History  Problem Relation Age of Onset   Heart failure Mother    CAD Brother     Social History  reports that she has quit smoking. Her smoking use included cigarettes. She has never used smokeless tobacco. She reports that she does not currently use alcohol. She reports current drug use. Drug: Codeine.  Allergies  Allergen Reactions   Ceftriaxone Shortness Of Breath and Hives    Allergy, hives   Shellfish Allergy Hives   Minoxidil Swelling    Medications   Current Facility-Administered Medications:     stroke: early stages of recovery book, , Does not apply, Once, Niu, Xilin, MD   acetaminophen  (TYLENOL ) tablet 650 mg, 650 mg, Oral, Q4H PRN **OR** acetaminophen  (TYLENOL ) 160 MG/5ML solution 650 mg, 650 mg, Per Tube, Q4H PRN **OR** acetaminophen  (TYLENOL ) suppository 650 mg, 650 mg, Rectal, Q4H PRN, Niu, Xilin, MD   amLODipine  (NORVASC ) tablet 10 mg, 10 mg, Oral, Daily, Patel, Sona, MD   aspirin  EC tablet 325 mg, 325  mg, Oral, Daily, Niu, Xilin, MD, 325 mg at 03/08/24 0900   atorvastatin  (LIPITOR) tablet 40 mg, 40 mg, Oral, QPM, Niu, Xilin, MD, 40 mg at 03/08/24 8177   calcitRIOL  (ROCALTROL ) capsule 0.75 mcg, 0.75 mcg, Oral, Q MTWThF, Niu, Xilin, MD   Chlorhexidine  Gluconate Cloth 2 % PADS 6 each, 6 each, Topical, Q0600, Breeze, Shantelle, NP   cinacalcet  (SENSIPAR ) tablet 60 mg, 60 mg, Oral, Q supper, Patel, Sona, MD   NOREEN ON 03/11/2024] cloNIDine  (CATAPRES  - Dosed in mg/24 hr) patch 0.3 mg, 0.3 mg, Transdermal, Q Fri, Patel, Sona, MD   diphenhydrAMINE  (BENADRYL ) injection 12.5 mg, 12.5 mg, Intravenous, Q8H PRN, Niu, Xilin, MD   gabapentin  (NEURONTIN ) capsule 200 mg, 200 mg, Oral, TID, Niu, Xilin, MD, 200 mg at 03/08/24 1822   heparin  injection 5,000 Units, 5,000 Units, Subcutaneous, Q8H, Niu, Xilin, MD, 5,000 Units at 03/08/24 1859   hydrALAZINE  (APRESOLINE ) injection 10 mg, 10 mg, Intravenous, Q2H PRN, Niu, Xilin, MD, 10 mg at 03/08/24 1550   hydrALAZINE  (APRESOLINE ) tablet 50 mg, 50 mg, Oral, Q8H, Patel, Sona, MD   irbesartan  (AVAPRO ) tablet 300 mg, 300 mg, Oral, Daily, Patel, Sona, MD   melatonin tablet 5 mg, 5 mg, Oral, QHS, Niu, Xilin, MD   pantoprazole  (PROTONIX ) EC tablet 40 mg, 40 mg, Oral, Daily, Niu, Xilin, MD, 40 mg at 03/08/24 0901   polyethylene glycol (MIRALAX  / GLYCOLAX ) packet 17 g, 17 g, Oral, Daily, Niu, Xilin, MD, 17 g  at 03/08/24 0901   senna-docusate (Senokot-S) tablet 2 tablet, 2 tablet, Oral, BID, Niu, Xilin, MD, 2 tablet at 03/08/24 0900   sevelamer  carbonate (RENVELA ) tablet 1,600 mg, 1,600 mg, Oral, TID WC, Niu, Xilin, MD, 1,600 mg at 03/08/24 1822  Vitals   Vitals:   03/08/24 1500 03/08/24 1530 03/08/24 1600 03/08/24 1630  BP: (!) 209/123 (!) 199/131 (!) 192/120 (!) 193/115  Pulse: 84 86 89 86  Resp: 18 (!) 22 20 (!) 22  Temp:    98.5 F (36.9 C)  TempSrc:    Oral  SpO2: 100% 100% 100% 98%  Weight:    75.9 kg  Height:        Body mass index is 29.64  kg/m.   Physical Exam   I saw her in the dialysis suite  Neurologic Examination    Neuro: Mental Status: Patient is awake, alert, oriented to person, place, month, year, and situation. Patient is able to give a clear and coherent history. No signs of aphasia or neglect Cranial Nerves: II: Visual Fields are full. Pupils are equal, round, and reactive to light.   III,IV, VI: EOMI without ptosis or diploplia.  V: Facial sensation is symmetric to temperature VII: Facial movement with left facial weakness VIII: hearing is intact to voice X: Uvula elevates symmetrically XII: tongue is midline without atrophy or fasciculations.  Motor: Tone is normal. Bulk is normal. 5/5 strength was present on the right, 4/5 in the left arm and leg Sensory: Sensation is symmetric to light touch and temperature in the arms and legs. Cerebellar: No clear ataxi        Labs/Imaging/Neurodiagnostic studies   CBC:  Recent Labs  Lab 03-22-24 2024 03/08/24 1300  WBC 5.8 5.7  NEUTROABS 3.4  --   HGB 10.2* 9.1*  HCT 31.2* 28.8*  MCV 78.4* 80.4  PLT 132* 139*   Basic Metabolic Panel:  Lab Results  Component Value Date   NA 139 03/08/2024   K 4.1 03/08/2024   CO2 24 03/08/2024   GLUCOSE 81 03/08/2024   BUN 87 (H) 03/08/2024   CREATININE 8.31 (H) 03/08/2024   CALCIUM  7.5 (L) 03/08/2024   GFRNONAA 5 (L) 03/08/2024   GFRAA  05/17/2007    >60        The eGFR has been calculated using the MDRD equation. This calculation has not been validated in all clinical   Lipid Panel:  Lab Results  Component Value Date   LDLCALC 61 03/08/2024   HgbA1c:  Lab Results  Component Value Date   HGBA1C 5.8 (H) 03-22-24   Urine Drug Screen:     Component Value Date/Time   LABOPIA NONE DETECTED 03/08/2024 0607   COCAINSCRNUR NONE DETECTED 03/08/2024 0607   LABBENZ NONE DETECTED 03/08/2024 0607   AMPHETMU NONE DETECTED 03/08/2024 0607   THCU NONE DETECTED 03/08/2024 0607   LABBARB NONE  DETECTED 03/08/2024 0607    Alcohol Level     Component Value Date/Time   [facility] <15 2024-03-22 2024   INR  Lab Results  Component Value Date   INR 0.9 Mar 22, 2024   APTT  Lab Results  Component Value Date   APTT 46 (H) 03-22-2024   AED levels: No results found for: PHENYTOIN, ZONISAMIDE, LAMOTRIGINE, LEVETIRACETA  CT Head without contrast(Personally reviewed): Negative  CT angio Head and Neck with contrast(Personally reviewed): No stenosis   MRI Brain(Personally reviewed): No acute findings  Echo with no clear embolic source Telemetry with normal sinus rhythm  ASSESSMENT  Allison Hill is a 53 y.o. female with transient worsening of her left-sided deficits.  Reports that she worsens in times of physiological stress could be suggestive that this was recrudescence, though TIA could also be a possibility.  Either way, secondary stroke prevention metrics should be used, but I do not feel strongly about dual antiplatelet therapy.  RECOMMENDATIONS  Continue ASA 81 mg daily, add Plavix 75 mg daily for 3 weeks and then resume monotherapy Continue atorvastatin  at home dose of 40 mg nightly as her LDL is at goal Blood pressure control with goal of normotension Follow-up as outpatient  ______________________________________________________________________    Signed, Aisha Seals, MD [facility]

## 2024-03-08 NOTE — Progress Notes (Signed)
 PT Cancellation Note  Patient Details Name: Allison Hill MRN: [id number] DOB: 04-19-1971   Cancelled Treatment:    Reason Eval/Treat Not Completed: Patient at procedure or test/unavailable, will attempt to see pt at a future date/time as medically appropriate.     CHARM Glendia Bertin PT, DPT 03/08/24, 1:49 PM

## 2024-03-08 NOTE — ED Notes (Signed)
 Pt to dialysis at this time

## 2024-03-08 NOTE — Care Management Obs Status (Signed)
 MEDICARE OBSERVATION STATUS NOTIFICATION   Patient Details  Name: Allison Hill MRN: [id number] Date of Birth: [birth year]   Medicare Observation Status Notification Given:       [name] 03/08/2024, 4:25 PM

## 2024-03-08 NOTE — ED Notes (Addendum)
 Assumed care of pt from [facility]. Introduced self to pt. Pt has no further needs at this time,

## 2024-03-08 NOTE — Progress Notes (Signed)
 Pt receives outpt HD at [facility]. Navigator following to assist with any HD needs.  Suzen Satchel Dialysis Navigator [phone]

## 2024-03-08 NOTE — Care Management Obs Status (Signed)
 MEDICARE OBSERVATION STATUS NOTIFICATION   Patient Details  Name: Allison Hill MRN: [id number] Date of Birth: [birth year]   Medicare Observation Status Notification Given:  Yes    [name] 03/08/2024, 4:28 PM

## 2024-03-08 NOTE — Progress Notes (Signed)
 Hemodialysis Note:  Received patient in bed to unit. Alert and oriented. Informed consent singed and in chart.  Treatment initiated: 1244 Treatment completed: 1630  Access used: Right Subclavian catheter Access issues: None  Patient tolerated well. Transported back to room, alert without acute distress. Report given to patient's RN.  Total UF removed: 1 Liter Medications given: Hydralazine  10 mg IV  Post HD weight: 75.9 Kg  [name]

## 2024-03-09 ENCOUNTER — Inpatient Hospital Stay
Admission: RE | Admit: 2024-03-09 | Discharge: 2024-03-09 | Disposition: A | Discharge status: Home / Self Care | Source: Ambulatory Visit

## 2024-03-09 DIAGNOSIS — R4702 Dysphasia: Secondary | ICD-10-CM | POA: Diagnosis not present

## 2024-03-09 DIAGNOSIS — N186 End stage renal disease: Secondary | ICD-10-CM | POA: Diagnosis not present

## 2024-03-09 DIAGNOSIS — I6389 Other cerebral infarction: Secondary | ICD-10-CM | POA: Diagnosis not present

## 2024-03-09 MED ORDER — CYCLOBENZAPRINE HCL 10 MG PO TABS
5.0000 mg | ORAL_TABLET | Freq: Once | ORAL | Status: AC | PRN
  Administered 2024-03-09: 5 mg via ORAL
  Filled 2024-03-09: qty 1

## 2024-03-09 MED ORDER — GABAPENTIN 100 MG PO CAPS
200.0000 mg | ORAL_CAPSULE | Freq: Once | ORAL | Status: AC
  Administered 2024-03-09: 200 mg via ORAL
  Filled 2024-03-09: qty 2

## 2024-03-09 MED ORDER — LABETALOL HCL 200 MG PO TABS
200.0000 mg | ORAL_TABLET | Freq: Two times a day (BID) | ORAL | Status: DC
Start: 2024-03-09 — End: 2024-03-10
  Administered 2024-03-09 – 2024-03-10 (×3): 200 mg via ORAL
  Filled 2024-03-09 (×3): qty 1

## 2024-03-09 MED ORDER — HYDRALAZINE HCL 50 MG PO TABS
100.0000 mg | ORAL_TABLET | Freq: Three times a day (TID) | ORAL | Status: DC
  Administered 2024-03-09 – 2024-03-10 (×4): 100 mg via ORAL
  Filled 2024-03-09 (×4): qty 2

## 2024-03-09 NOTE — Evaluation (Signed)
 Occupational Therapy Evaluation Patient Details Name: Allison Hill MRN: [id number] DOB: 1970/08/16 Today's Date: 03/09/2024   History of Present Illness   53 y.o. female with hx of ESRD, previous strokes, left-sided weakness, who presented with left-sided weakness last night.  She was seen by teleneurology who recommended admission for stroke workup.  Since that time, her symptoms have improved.  She states that her left-sided weakness always gets worse if her blood pressure goes up or if she needs dialysis she states that it gets worse whenever she feels bad     Clinical Impressions Upon entering the room, pt supine in bed and agreeable to OT intervention. Pt reports living at LTC facility [facility]. She endorses transferring to wheelchair and able to propel herself at baseline. She has not been ambulating recently because she is fearful of falling. Pt endorses needing some assistance with self care tasks but has been able to perform basic ADLs on her own recently with modifications as needed. Pt performs bed mobility without assistance and dons B socks while seated on EOB. Pt stands and pivots to recliner chair with supervision. She endorses being at her functional baseline and wanting to return to facility as soon as she can. Pt with no skilled OT need at this time. OT to complete orders.       Functional Status Assessment   Patient has not had a recent decline in their functional status     Equipment Recommendations   None recommended by OT     Recommendations for Other Services         Precautions/Restrictions   Precautions Precautions: Fall     Mobility Bed Mobility Overal bed mobility: Modified Independent                  Transfers Overall transfer level: Needs assistance Equipment used: None Transfers: Sit to/from Stand, Bed to chair/wheelchair/BSC   Stand pivot transfers: Supervision         General transfer comment: Pt stands and front  faces chair to turn and pivot with supervision          ADL either performed or assessed with clinical judgement   ADL Overall ADL's : Needs assistance/impaired                                       General ADL Comments: Pt sits on EOB and dons B socks without assistance.     Vision Patient Visual Report: No change from baseline              Pertinent Vitals/Pain Pain Assessment Pain Assessment: No/denies pain     Extremity/Trunk Assessment Upper Extremity Assessment Upper Extremity Assessment: Right hand dominant;LUE deficits/detail LUE Deficits / Details: L hemiplegia from prior CVA           Communication Communication Communication: No apparent difficulties   Cognition Arousal: Alert Behavior During Therapy: WFL for tasks assessed/performed Cognition: No apparent impairments                               Following commands: Intact       Cueing  General Comments   Cueing Techniques: Verbal cues              Home Living Family/patient expects to be discharged to:: Skilled nursing facility  Additional Comments: LTC at [facility]      Prior Functioning/Environment Prior Level of Function : Needs assist             Mobility Comments: Pt transfers self into wheelchair and propels self ADLs Comments: facility assists with bathing,  medication management, and meal prep. no assitance needed with dressing or toileting per pt report            OT Goals(Current goals can be found in the care plan section)   Acute Rehab OT Goals Patient Stated Goal: to return to LTC OT Goal Formulation: With patient Time For Goal Achievement: 03/09/24 Potential to Achieve Goals: Fair   OT Frequency:          AM-PAC OT 6 Clicks Daily Activity     Outcome Measure Help from another person eating meals?: None Help from another person taking care of personal grooming?: None Help  from another person toileting, which includes using toliet, bedpan, or urinal?: A Little Help from another person bathing (including washing, rinsing, drying)?: A Little Help from another person to put on and taking off regular upper body clothing?: None Help from another person to put on and taking off regular lower body clothing?: A Little 6 Click Score: 21   End of Session Nurse Communication: Mobility status;Other (comment) (no chair alarm present)  Activity Tolerance: Patient tolerated treatment well Patient left: in chair;with call bell/phone within reach                   Time: 1122-1136 OT Time Calculation (min): 14 min Charges:  OT General Charges $OT Visit: 1 Visit OT Evaluation $OT Eval Moderate Complexity: 1 [address] [place], OTR/L , [facility]IS ascom [phone]  03/09/24, 12:39 PM

## 2024-03-09 NOTE — Plan of Care (Signed)
  Problem: Ischemic Stroke/TIA Tissue Perfusion: Goal: Complications of ischemic stroke/TIA will be minimized Outcome: Progressing   Problem: Education: Goal: Knowledge of disease or condition will improve Outcome: Progressing   Problem: Coping: Goal: Will verbalize positive feelings about self Outcome: Progressing Goal: Will identify appropriate support needs Outcome: Progressing   Problem: Health Behavior/Discharge Planning: Goal: Ability to manage health-related needs will improve Outcome: Progressing Goal: Goals will be collaboratively established with patient/family Outcome: Progressing   Problem: Self-Care: Goal: Ability to participate in self-care as condition permits will improve Outcome: Progressing Goal: Verbalization of feelings and concerns over difficulty with self-care will improve Outcome: Progressing Goal: Ability to communicate needs accurately will improve Outcome: Progressing   Problem: Nutrition: Goal: Risk of aspiration will decrease Outcome: Progressing Goal: Dietary intake will improve Outcome: Progressing

## 2024-03-09 NOTE — Progress Notes (Signed)
 SLP Cancellation Note  Patient Details Name: Porche Steinberger MRN: [id number] DOB: 07-20-1970   Cancelled treatment:       Reason Eval/Treat Not Completed: SLP screened, no needs identified, will sign off (chart reviewed; consulted NSG and met w/ pt in room.)  Per Neurology note/chart notes, pt is a 53 y.o. female with hx of ESRD, previous strokes, left-sided weakness, who presented with left-sided weakness last night.  She missed Dialysis on Monday.  She was seen by teleneurology who recommended admission for stroke workup.  Since that time, her symptoms have improved.  She states that her left-sided weakness always gets worse if her blood pressure goes up or if she needs dialysis she states that it gets worse whenever she feels bad.  She is back to baseline currently..   Pt denied any difficulty swallowing and is currently on a regular diet; tolerates swallowing pills w/ water per NSG. Pt conversed in full conversation w/out expressive/receptive deficits noted; pt denied any speech-language deficits. Speech intelligible. Pt endorsed being a little sleepy. No further skilled ST services indicated as pt appears at her baseline. Pt agreed. NSG to reconsult if any change in status while admitted.      Comer Portugal, MS, CCC-SLP Speech Language Pathologist Rehab Services; [facility] [phone] (ascom) Benjamin Merrihew 03/09/2024, 8:18 AM

## 2024-03-09 NOTE — Progress Notes (Signed)
 [facility]  ROUNDING NOTE   Subjective:   Allison Hill is a 53 y.o. female with past medical history of ESRD on HD, hypertension, congestive heart failure, migraine headaches, history of intracranial hemorrhage, cocaine abuse, sickle cell trait who was admitted for Facial droop [R29.810] Stroke [facility]) [I63.9] Vision changes [H53.9]  Patient is known to our practice and is a resident at [facility], where she receives her dialysis.  Patient resting quietly in bed Alert Denies headache Tolerating meals  Objective:  Vital signs in last 24 hours:  Temp:  [97.9 F (36.6 C)-98.6 F (37 C)] 98.6 F (37 C) (10/08 0907) Pulse Rate:  [81-98] 90 (10/08 1319) Resp:  [15-22] 16 (10/08 1246) BP: (173-220)/(98-131) 174/103 (10/08 1319) SpO2:  [97 %-100 %] 99 % (10/08 1246) Weight:  [75.9 kg] 75.9 kg (10/07 1630)  Weight change: -3.84 kg Filed Weights   03/07/24 1932 03/08/24 1230 03/08/24 1630  Weight: 84.6 kg 76.9 kg 75.9 kg    Intake/Output: I/O last 3 completed shifts: In: 632.4 [P.O.:340; I.V.:292.4] Out: 1400 [Urine:400; Other:1000]   Intake/Output this shift:  Total I/O In: 240 [P.O.:240] Out: -   Physical Exam: General: NAD  Head: Normocephalic, atraumatic. Moist oral mucosal membranes  Eyes: Anicteric  Lungs:  Clear to auscultation, room air  Heart: Regular rate and rhythm  Abdomen:  Soft, nontender  Extremities: Trace peripheral edema.  Neurologic: Awake, alert, conversant  Skin: Warm,dry, no rash  Access: Right IJ PermCath    Basic Metabolic Panel: Recent Labs  Lab 03/07/24 2024 03/08/24 1300  NA 139 139  K 4.0 4.1  CL 99 101  CO2 25 24  GLUCOSE 98 81  BUN 83* 87*  CREATININE 7.45* 8.31*  CALCIUM  8.4* 7.5*  PHOS  --  3.5    Liver Function Tests: Recent Labs  Lab 03/07/24 2024 03/08/24 1300  AST 24  --   ALT 17  --   ALKPHOS 377*  --   BILITOT 0.7  --   PROT 7.5  --   ALBUMIN 3.8 3.4*   No results for input(s):  LIPASE, AMYLASE in the last 168 hours. No results for input(s): AMMONIA in the last 168 hours.  CBC: Recent Labs  Lab 03/07/24 2024 03/08/24 1300  WBC 5.8 5.7  NEUTROABS 3.4  --   HGB 10.2* 9.1*  HCT 31.2* 28.8*  MCV 78.4* 80.4  PLT 132* 139*    Cardiac Enzymes: No results for input(s): CKTOTAL, CKMB, CKMBINDEX, TROPONINI in the last 168 hours.  BNP: Invalid input(s): POCBNP  CBG: Recent Labs  Lab 03/07/24 1905  GLUCAP 99    Microbiology: Results for orders placed or performed during the hospital encounter of 01/31/24  MRSA Next Gen by PCR, Nasal     Status: Abnormal   Collection Time: 01/31/24  1:13 PM   Specimen: Nasal Mucosa; Nasal Swab  Result Value Ref Range Status   MRSA by PCR Next Gen DETECTED (A) NOT DETECTED Final    Comment: CRITICAL RESULT CALLED TO, READ BACK BY AND VERIFIED WITH: KATHERINE CLAYTON 01/31/24 1517 SLM (NOTE) The GeneXpert MRSA Assay (FDA approved for NASAL specimens only), is one component of a comprehensive MRSA colonization surveillance program. It is not intended to diagnose MRSA infection nor to guide or monitor treatment for MRSA infections. Test performance is not FDA approved in patients less than 73 years old. Performed at [facility], [address], [place], [place] [address]     Coagulation Studies: Recent Labs  03/07/24 2024  LABPROT 13.0  INR 0.9    Urinalysis: No results for input(s): COLORURINE, LABSPEC, PHURINE, GLUCOSEU, HGBUR, BILIRUBINUR, KETONESUR, PROTEINUR, UROBILINOGEN, NITRITE, LEUKOCYTESUR in the last 72 hours.  Invalid input(s): APPERANCEUR    Imaging: ECHOCARDIOGRAM COMPLETE Result Date: 03/08/2024    ECHOCARDIOGRAM REPORT   Patient Name:   Allison Hill Date of Exam: 03/08/2024 Medical Rec #:  [id number]        Height:       63.0 in Accession #:    [id number]       Weight:       186.5 lb Date of Birth:  December 02, 1970        BSA:          1.877 m  Patient Age:    53 years         BP:           184/110 mmHg Patient Gender: F                HR:           90 bpm. Exam Location:  [facility] Procedure: 2D Echo, Cardiac Doppler, Color Doppler and Strain Analysis (Both            Spectral and Color Flow Doppler were utilized during procedure). Indications:     Stroke I63.9  History:         Patient has prior history of Echocardiogram examinations, most                  recent 12/08/2023. Stroke.  Sonographer:     Rosina Dunk Referring Phys:  [id number] XILIN NIU Diagnosing Phys: Lonni Hanson MD  Sonographer Comments: Global longitudinal strain was attempted. IMPRESSIONS  1. Left ventricular ejection fraction, by estimation, is 60 to 65%. The left ventricle has normal function. The left ventricle has no regional wall motion abnormalities. There is severe asymmetric left ventricular hypertrophy of the inferior segment. Indeterminate diastolic filling due to E-A fusion. The average left ventricular global longitudinal strain is -13.3 %. The global longitudinal strain is abnormal.  2. Right ventricular systolic function is normal. The right ventricular size is normal. Mildly increased right ventricular wall thickness. Tricuspid regurgitation signal is inadequate for assessing PA pressure.  3. Left atrial size was moderately dilated.  4. The mitral valve is normal in structure. Mild mitral valve regurgitation.  5. The aortic valve is tricuspid. There is mild calcification of the aortic valve. There is mild thickening of the aortic valve. Aortic valve regurgitation is mild.  6. The inferior vena cava is dilated in size with <50% respiratory variability, suggesting right atrial pressure of 15 mmHg. FINDINGS  Left Ventricle: Left ventricular ejection fraction, by estimation, is 60 to 65%. The left ventricle has normal function. The left ventricle has no regional wall motion abnormalities. The average left ventricular global longitudinal strain is -13.3 %. Strain was  performed and the global longitudinal strain is abnormal. The left ventricular internal cavity size was normal in size. There is severe asymmetric left ventricular hypertrophy of the inferior segment. Indeterminate diastolic filling due to E-A  fusion. Right Ventricle: The right ventricular size is normal. Mildly increased right ventricular wall thickness. Right ventricular systolic function is normal. Tricuspid regurgitation signal is inadequate for assessing PA pressure. Left Atrium: Left atrial size was moderately dilated. Right Atrium: Right atrial size was normal in size. Pericardium: There is no evidence of pericardial effusion. Presence of epicardial fat layer. Mitral Valve: The mitral valve is  normal in structure. Mild mitral valve regurgitation. MV peak gradient, 6.2 mmHg. The mean mitral valve gradient is 3.0 mmHg. Tricuspid Valve: The tricuspid valve is normal in structure. Tricuspid valve regurgitation is mild. Aortic Valve: The aortic valve is tricuspid. There is mild calcification of the aortic valve. There is mild thickening of the aortic valve. Aortic valve regurgitation is mild. Aortic valve mean gradient measures 9.0 mmHg. Aortic valve peak gradient measures 16.3 mmHg. Aortic valve area, by VTI measures 2.31 cm. Pulmonic Valve: The pulmonic valve was normal in structure. Pulmonic valve regurgitation is trivial. Aorta: The aortic root is normal in size and structure. Pulmonary Artery: The pulmonary artery is of normal size. Venous: The inferior vena cava is dilated in size with less than 50% respiratory variability, suggesting right atrial pressure of 15 mmHg. IAS/Shunts: The interatrial septum was not well visualized.  LEFT VENTRICLE PLAX 2D LVIDd:         5.10 cm      Diastology LVIDs:         3.40 cm      LV e' medial:  6.09 cm/s LV PW:         1.88 cm      LV e' lateral: 9.14 cm/s LV IVS:        1.20 cm LVOT diam:     2.15 cm      2D Longitudinal Strain LV SV:         82           2D Strain GLS  Avg:     -13.3 % LV SV Index:   44 LVOT Area:     3.63 cm  LV Volumes (MOD) LV vol d, MOD A2C: 79.2 ml LV vol d, MOD A4C: 105.0 ml LV vol s, MOD A2C: 25.4 ml LV vol s, MOD A4C: 59.0 ml LV SV MOD A2C:     53.8 ml LV SV MOD A4C:     105.0 ml LV SV MOD BP:      52.8 ml RIGHT VENTRICLE             IVC RV Basal diam:  4.10 cm     IVC diam: 2.50 cm RV Mid diam:    3.40 cm RV S prime:     14.10 cm/s TAPSE (M-mode): 1.9 cm LEFT ATRIUM             Index        RIGHT ATRIUM           Index LA diam:        3.50 cm 1.86 cm/m   RA Area:     14.70 cm LA Vol (A2C):   91.9 ml 48.96 ml/m  RA Volume:   33.50 ml  17.85 ml/m LA Vol (A4C):   83.7 ml 44.59 ml/m LA Biplane Vol: 86.6 ml 46.14 ml/m  AORTIC VALVE                     PULMONIC VALVE AV Area (Vmax):    2.50 cm      PV Vmax:        1.47 m/s AV Area (Vmean):   2.27 cm      PV Vmean:       101.000 cm/s AV Area (VTI):     2.31 cm      PV VTI:         0.269 m AV Vmax:  202.00 cm/s   PV Peak grad:   8.6 mmHg AV Vmean:          136.000 cm/s  PV Mean grad:   5.0 mmHg AV VTI:            0.357 m       RVOT Peak grad: 3 mmHg AV Peak Grad:      16.3 mmHg AV Mean Grad:      9.0 mmHg LVOT Vmax:         139.00 cm/s LVOT Vmean:        84.900 cm/s LVOT VTI:          0.227 m LVOT/AV VTI ratio: 0.64  AORTA Ao Root diam: 3.40 cm MITRAL VALVE MV Area VTI:  3.99 cm      SHUNTS MV Peak grad: 6.2 mmHg      Systemic VTI:  0.23 m MV Mean grad: 3.0 mmHg      Systemic Diam: 2.15 cm MV Vmax:      1.25 m/s      Pulmonic VTI:  0.202 m MV Vmean:     84.2 cm/s MV A velocity: 113.00 cm/s Lonni Hanson MD Electronically signed by Lonni Hanson MD Signature Date/Time: 03/08/2024/5:45:43 PM    Final    MR BRAIN WO CONTRAST Result Date: 03/08/2024 EXAM: MRI BRAIN WITHOUT CONTRAST 03/08/2024 02:33:09 AM TECHNIQUE: Multiplanar multisequence MRI of the head/brain was performed without the administration of intravenous contrast. COMPARISON: 06/11/2023 CLINICAL HISTORY: Acute neuro deficit,  stroke suspected. 53 year old female with history of intracranial hemorrhage, stroke with left-sided weakness, hypertension, decompensated congestive heart failure, thrombocytopenia, end-stage renal disease on hemodialysis, vertigo, obesity, former smoker, and cocaine abuse, presenting with difficulty speaking and unsteady gait. FINDINGS: BRAIN AND VENTRICLES: No acute infarct, intracranial hemorrhage, mass, midline shift, or hydrocephalus. Numerous chronic microhemorrhages in a predominantly central distribution consistent with chronic hypertensive angiopathy. Multifocal hyperintense T2-weighted signal within the cerebral white matter, most commonly due to chronic small vessel ischemic disease. Multiple small vessel infarcts of the deep gray nuclei. The sella is unremarkable. Normal flow voids. ORBITS: No acute abnormality. SINUSES AND MASTOIDS: Right mastoid effusion. BONES AND SOFT TISSUES: Normal marrow signal. No acute soft tissue abnormality. IMPRESSION: 1. No acute intracranial abnormality. 2. Findings consistent with chronic hypertensive angiopathy and chronic small vessel ischemic disease, with numerous chronic microhemorrhages and multifocal white matter T2 hyperintensities. 3. Multiple chronic small vessel infarcts involving the deep gray nuclei. Electronically signed by: Franky Stanford MD 03/08/2024 03:07 AM EDT RP Workstation: [id number]   CT ANGIO HEAD NECK W WO CM W PERF (CODE STROKE) Result Date: 03/07/2024 EXAM: CTA Head and Neck with Perfusion 03/07/2024 08:58:26 PM TECHNIQUE: CTA of the head and neck was performed without and with the administration of 100 mL of iohexol  (OMNIPAQUE ) 350 MG/ML injection. 3D postprocessing with multiplanar reconstructions and MIPs was performed to evaluate the vascular anatomy. Cerebral perfusion analysis using computed tomography with contrast administration, including post-processing of parametric maps with determination of cerebral blood flow, cerebral blood  volume, mean transit time and time-to-maximum. Automated exposure control, iterative reconstruction, and/or weight based adjustment of the mA/kV was utilized to reduce the radiation dose to as low as reasonably achievable. COMPARISON: 01/31/2024 CLINICAL HISTORY: Neuro deficit, acute, stroke suspected. Code stroke. FINDINGS: CTA NECK: AORTIC ARCH AND ARCH VESSELS: Calcific aortic atherosclerosis. No dissection or arterial injury. No significant stenosis of the brachiocephalic or subclavian arteries. CERVICAL CAROTID ARTERIES: No dissection, arterial injury, or hemodynamically significant stenosis by NASCET criteria. CERVICAL  VERTEBRAL ARTERIES: No dissection, arterial injury, or significant stenosis. LUNGS AND MEDIASTINUM: Unremarkable. SOFT TISSUES: No acute abnormality. BONES: No acute abnormality. CTA HEAD: ANTERIOR CIRCULATION: No significant stenosis of the internal, anterior, or middle cerebral arteries. No aneurysm. POSTERIOR CIRCULATION: No significant stenosis of the posterior cerebral, basilar, or vertebral arteries. No aneurysm. OTHER: No dural venous sinus thrombosis on this non-dedicated study. CT PERFUSION: EXAM QUALITY: Exam quality is adequate with diagnostic perfusion maps. No significant motion artifact. Appropriate arterial inflow and venous outflow curves. CORE INFARCT (CBF<30% volume): 0 mL TOTAL HYPOPERFUSION (Tmax>6s volume): 0 mL PENUMBRA: Mismatch volume: 0 mL Mismatch ratio: not applicable Location: not applicable IMPRESSION: 1. No evidence of ischemia by CT brain perfusion. 2. No acute large vessel occlusion. 3. No hemodynamically significant stenosis or aneurysm in the head or neck vessels. Electronically signed by: Franky Stanford MD 03/07/2024 09:07 PM EDT RP Workstation: [id number]   CT HEAD CODE STROKE WO CONTRAST Addendum Date: 03/07/2024 ADDENDUM REPORT: 03/07/2024 19:36 ADDENDUM: No evidence of an acute intracranial abnormality. These results were called by telephone on 03/07/2024  at 7:36 pm to provider MICHAEL MIAN , who verbally acknowledged these results. Electronically Signed   By: Rockey Childs D.O.   On: 03/07/2024 19:36   Result Date: 03/07/2024 CLINICAL DATA:  Code stroke. Neuro deficit, acute, stroke suspected. EXAM: CT HEAD WITHOUT CONTRAST TECHNIQUE: Contiguous axial images were obtained from the base of the skull through the vertex without intravenous contrast. RADIATION DOSE REDUCTION: This exam was performed according to the departmental dose-optimization program which includes automated exposure control, adjustment of the mA and/or kV according to patient size and/or use of iterative reconstruction technique. COMPARISON:  Brain MRI 02/01/2024. Head CT 01/31/2024. FINDINGS: Brain: Mild generalized parenchymal atrophy. Known small chronic cortically-based infarcts within the left parietal lobe and at the right temporo-occipital junction Known chronic lacunar infarcts within the bilateral cerebral hemispheric white matter, within/about the bilateral deep gray nuclei and within the left aspect of the pons, some of which were better appreciated on the prior brain MRI of 02/01/2024. Background age-advanced cerebral white matter chronic small vessel ischemic disease. The tiny chronic cerebellar infarcts demonstrated on the prior MRI are occult by CT. There is no acute intracranial hemorrhage. No acute demarcated cortical infarct. No extra-axial fluid collection. No evidence of an intracranial mass. No midline shift. Vascular: No hyperdense vessel. Atherosclerotic calcifications. Skull: No calvarial fracture or aggressive osseous lesion. Sinuses/Orbits: No mass or acute finding within the imaged orbits. No significant paranasal sinus disease at the imaged levels. Other: Trace fluid within right mastoid air cells. ASPECTS [facility] Stroke Program Early CT Score) - Ganglionic level infarction (caudate, lentiform nuclei, internal capsule, insula, M1-M3 cortex): 7 - Supraganglionic  infarction (M4-M6 cortex): 3 Total score (0-10 with 10 being normal): 10 (when discounting chronic infarcts). Attempts are being made to reach the ordering provider at this time. IMPRESSION: 1. No evidence of an acute intracranial abnormality. 2. Parenchymal atrophy, advanced chronic small vessel ischemic disease and unchanged chronic infarcts, as described within the body of the report. 3. Trace right mastoid effusion. Electronically Signed: By: Rockey Childs D.O. On: 03/07/2024 19:33     Medications:       stroke: early stages of recovery book   Does not apply Once   amLODipine   10 mg Oral Daily   aspirin  EC  325 mg Oral Daily   atorvastatin   40 mg Oral QPM   calcitRIOL   0.75 mcg Oral Q MTWThF   Chlorhexidine  Gluconate Cloth  6 each Topical Q0600   cinacalcet   60 mg Oral Q supper   [START ON 03/11/2024] cloNIDine   0.3 mg Transdermal Q Fri   clopidogrel  75 mg Oral Daily   gabapentin   200 mg Oral TID   heparin   5,000 Units Subcutaneous Q8H   hydrALAZINE   100 mg Oral Q8H   irbesartan   300 mg Oral Daily   labetalol   200 mg Oral BID   melatonin  5 mg Oral QHS   pantoprazole   40 mg Oral Daily   polyethylene glycol  17 g Oral Daily   sevelamer  carbonate  1,600 mg Oral TID WC   acetaminophen  **OR** acetaminophen  (TYLENOL ) oral liquid 160 mg/5 mL **OR** acetaminophen , diphenhydrAMINE , hydrALAZINE   Assessment/ Plan:  Allison Hill is a 53 y.o.  female with past medical history of ESRD on HD, hypertension, congestive heart failure, migraine headaches, history of intracranial hemorrhage, cocaine abuse, sickle cell trait who was admitted for Facial droop [R29.810] Stroke [facility]) [I63.9] Vision changes [H53.9]   Hypertensive urgency, 164/114 on ED arrival.  Home regimen includes amlodipine , clonidine , hydralazine , irbesartan , isosorbide , and labetalol .  Blood pressure 178/112 this morning. Prescribed amlodipine  clonidine  patch, hydralazine , and irbesartan . Will restart labetalol  200mg   twice daily.   2.  End-stage renal disease on hemodialysis.  Received dialysis yesterday, UF 1L achieved. Next treatment scheduled for Thursday.   3. Anemia of chronic kidney disease Lab Results  Component Value Date   HGB 9.1 (L) 03/08/2024    Due to elevated blood pressure, will hold ESA's at this time. Monitoring   4. Secondary Hyperparathyroidism: with outpatient labs: PTH 1767, phosphorus 4.4, calcium  8.5 on 9/10.   Lab Results  Component Value Date   PTH 1,248 (H) 08/11/2023   CALCIUM  7.5 (L) 03/08/2024   PHOS 3.5 03/08/2024    Patient prescribed calcium  carbonate outpatient.  Will continue to monitor bone minerals.   LOS: 0 Allison Hill 10/8/20251:40 PM

## 2024-03-09 NOTE — Evaluation (Signed)
 Physical Therapy Evaluation Patient Details Name: Allison Hill MRN: [id number] DOB: May 06, 1971 Today's Date: 03/09/2024  History of Present Illness  53 y.o. female with hx of ESRD, previous strokes, left-sided weakness, who presented with new onset left-sided weaknes/numbness. She was seen by teleneurology who recommended admission for stroke workup. Pt continues with significant dizziness on eval.  She states that her left-sided weakness always gets worse if her blood pressure goes up or if she needs dialysis she states that it gets worse whenever she feels bad  Clinical Impression  Pt showed good overall effort, but continues to struggle with dizziness despite getting meds before session.  She was able to stand up and do some in-room ambulation but clearly felt poorly the entire time.  Pt with chronic L Sided weakness that is more pronounced and now with numbness and considerable coordination difficulties, LE>UE; pt also with L side visual field defecits.  Pt will benefit from ongoing PT to address functional limitations.        If plan is discharge home, recommend the following: A lot of help with walking and/or transfers;A lot of help with bathing/dressing/bathroom;Assistance with cooking/housework;Direct supervision/assist for medications management;Direct supervision/assist for financial management;Assist for transportation   Can travel by private vehicle   Yes    Equipment Recommendations None recommended by PT  Recommendations for Other Services       Functional Status Assessment Patient has had a recent decline in their functional status and demonstrates the ability to make significant improvements in function in a reasonable and predictable amount of time.     Precautions / Restrictions Precautions Precautions: Fall Restrictions Weight Bearing Restrictions Per Provider Order: No      Mobility  Bed Mobility Overal bed mobility: Needs Assistance, Modified  Independent             General bed mobility comments: slow and effortful, but able to don socks and get herself up to sitting EOB w/o direct assist    Transfers Overall transfer level: Needs assistance Equipment used: Rolling walker (2 wheels) Transfers: Sit to/from Stand, Bed to chair/wheelchair/BSC Sit to Stand: Min assist           General transfer comment: Needed assist to rise from standard height bed, able to pull up on rail near commode to rise from it w/o PT Assist    Ambulation/Gait Ambulation/Gait assistance: Contact guard assist Gait Distance (Feet): 40 Feet Assistive device: Rolling walker (2 wheels)         General Gait Details: Pt was able to do a small loop in her room but did not have any overt safety issues.  L LE tending to drag behind when she did not focus on this and toes-out dragging was present for most steps despite cuing.  Stairs            Wheelchair Mobility     Tilt Bed    Modified Rankin (Stroke Patients Only)       Balance Overall balance assessment: Needs assistance Sitting-balance support: Bilateral upper extremity supported Sitting balance-Leahy Scale: Good     Standing balance support: Bilateral upper extremity supported Standing balance-Leahy Scale: Fair Standing balance comment: pt dizzy t/o the session, ultimately needing to sit after modest ambulation effort                             Pertinent Vitals/Pain Pain Assessment Pain Assessment:  (she reports her general L sided numbness and pain)  Home Living Family/patient expects to be discharged to:: Skilled nursing facility                   Additional Comments: LTC at [facility]    Prior Function Prior Level of Function : Needs assist             Mobility Comments: Pt transfers self into wheelchair and propels self - reports she doesn't walk more than 50 ft at a time at [facility] ADLs Comments: facility assists with bathing,   medication management, and meal prep. no assitance needed with dressing or toileting per pt report     Extremity/Trunk Assessment   Upper Extremity Assessment Upper Extremity Assessment: Right hand dominant LUE Deficits / Details: L hemiplegia from prior CVA - reports numbness, coordination and weakness all worse now    Lower Extremity Assessment Lower Extremity Assessment: LLE deficits/detail LLE Deficits / Details: R WNL, L grossly 3/5 with numbness and decreased quality of movement       Communication   Communication Communication: No apparent difficulties    Cognition Arousal: Alert Behavior During Therapy: WFL for tasks assessed/performed                             Following commands: Intact       Cueing Cueing Techniques: Verbal cues     General Comments General comments (skin integrity, edema, etc.): Pt with general dizziness and L eye block... did not feel well t/o session but never-the-less showed good effort    Exercises     Assessment/Plan    PT Assessment Patient needs continued PT services  PT Problem List Decreased strength;Decreased range of motion;Decreased activity tolerance;Decreased balance;Decreased mobility;Decreased coordination;Decreased knowledge of use of DME;Decreased safety awareness       PT Treatment Interventions DME instruction;Gait training;Functional mobility training;Therapeutic activities;Therapeutic exercise;Balance training;Patient/family education    PT Goals (Current goals can be found in the Care Plan section)  Acute Rehab PT Goals Patient Stated Goal: get rid of the dizziness PT Goal Formulation: With patient Time For Goal Achievement: 03/22/24 Potential to Achieve Goals: Fair    Frequency Min 2X/week     Co-evaluation               AM-PAC PT 6 Clicks Mobility  Outcome Measure Help needed turning from your back to your side while in a flat bed without using bedrails?: None Help needed moving  from lying on your back to sitting on the side of a flat bed without using bedrails?: A Little Help needed moving to and from a bed to a chair (including a wheelchair)?: A Little Help needed standing up from a chair using your arms (e.g., wheelchair or bedside chair)?: A Lot Help needed to walk in hospital room?: A Lot Help needed climbing 3-5 steps with a railing? : A Lot 6 Click Score: 16    End of Session Equipment Utilized During Treatment: Gait belt Activity Tolerance: Patient limited by fatigue Patient left: with bed alarm set;with call bell/phone within reach Nurse Communication: Mobility status PT Visit Diagnosis: Muscle weakness (generalized) (M62.81);Difficulty in walking, not elsewhere classified (R26.2);Dizziness and giddiness (R42);Other symptoms and signs involving the nervous system (R29.898)    Time: 8571-8546 PT Time Calculation (min) (ACUTE ONLY): 25 min   Charges:   PT Evaluation $PT Eval Low Complexity: 1 Low PT Treatments $Gait Training: 8-22 mins PT General Charges $$ ACUTE PT VISIT: 1 Visit  Carmin JONELLE Deed, DPT 03/09/2024, 4:02 PM

## 2024-03-09 NOTE — Progress Notes (Signed)
 PROGRESS NOTE Allison Hill    DOB: 1970/12/10, 53 y.o.  FMW:[id number]    Code Status: Full Code   DOA: 03/07/2024   LOS: 0  Brief Hill course  Allison Hill is a 54 y.o. female with a PMH significant for ICH, stroke with left-sided weakness, HTN, dCHF, thrombocytopenia, ESRD-HD ([facility]), vertigo, obesity, former smoker, cocaine abuse, who presents with difficulty speaking, unsteady gait. CTA- No evidence of ischemia by CT brain perfusion. MRI brain- No acute intracranial abnormality. 2. Findings consistent with chronic hypertensive angiopathy and chronic small vessel ischemic disease, with numerous chronic microhemorrhages and multifocal white matter T2 hyperintensities. 3. Multiple chronic small vessel infarcts involving the deep gray nuclei.  Neurology was consulted to evaluate given high risk of stroke and typical presentation. Recommended 3 weeks of DAPT followed by aspirin  alone and continuing atorvastatin  and blood pressure control.   03/09/24 -symptoms have resolved back to baseline. BP remains significantly elevated. Nephrology following for HD.   Assessment & Plan  Principal Problem:   Stroke [facility]) Active Problems:   ESRD on dialysis (HCC)   HTN (hypertension)   Hypertension   Chronic diastolic CHF (congestive heart failure) (HCC)   Thrombocytopenia   Cocaine use   Obesity (BMI 30-39.9)  Dysarthria with h/o Stroke Allison Hill): resolved-- pt has hx of stroke with chronic left-sided weakness and left facial droop. CT head negative.  CTA of head and neck with brain perfusion negative for LVO and acute brain ischemia. Brain MRI negative for acute.  - DAPT x3 weeks followed by ASA monotherapy  - continue Statin:  lipitor   ESRD on dialysis ([facility]): Missed on Monday. Underwent HD yesterday and per patient, next scheduled session will be tomorrow - continue HD per nephrology    HTN- still poorly controlled - continue clonidine  patch, amlodipine , irbesartan , labetolol,  increase dose of home hydralazine    Chronic diastolic CHF (congestive heart failure) (HCC): 2D echo on 12/08/2023 showed EF 55-60%.  No leg edema.  No SOB.  CHF seems to be compensated. -Volume management per renal by dialysis   Thrombocytopenia:: -- This is chronic issue.  Platelet 132, no active bleeding.   H/o Cocaine use - Check UDS--negative   Obesity (BMI 30-39.9): Patient has Obesity Class I, with body weight 84.6 Kg and BMI 33.04 kg/m2.  - Encourage losing weight - Exercise and healthy diet  Body mass index is 29.64 kg/m.  VTE ppx: heparin  injection 5,000 Units Start: 03/08/24 0600  Diet:     Diet   Diet renal with fluid restriction Fluid restriction: 1200 mL Fluid; Room service appropriate? Yes; Fluid consistency: Thin   Consultants: Nephrology  Neurology   Subjective 03/09/24    Pt reports no complaints today. Feels at baseline. No headache or visual changes.    Objective  Blood pressure (!) 178/112, pulse 94, temperature 97.9 F (36.6 C), resp. rate 15, height 5' 3 (1.6 m), weight 75.9 kg, SpO2 100%.  Intake/Output Summary (Last 24 hours) at 03/09/2024 0733 Last data filed at 03/08/2024 2014 Gross per 24 hour  Intake 622.38 ml  Output 1000 ml  Net -377.62 ml   Filed Weights   03/07/24 1932 03/08/24 1230 03/08/24 1630  Weight: 84.6 kg 76.9 kg 75.9 kg     Physical Exam:  General: awake, alert, NAD HEENT: atraumatic, clear conjunctiva, anicteric sclera, MMM, hearing grossly normal Respiratory: normal respiratory effort. Cardiovascular: extremities well perfused, quick capillary refill, normal S1/S2, RRR, no JVD, murmurs Nervous: A&O x3. no gross focal neurologic deficits, normal speech  Extremities: moves all equally, no edema, normal tone Skin: dry, intact, normal temperature, normal color. No rashes, lesions or ulcers on exposed skin Psychiatry: normal mood, congruent affect  Labs   I have personally reviewed the following labs and imaging  studies CBC    Component Value Date/Time   WBC 5.7 03/08/2024 1300   RBC 3.58 (L) 03/08/2024 1300   HGB 9.1 (L) 03/08/2024 1300   HCT 28.8 (L) 03/08/2024 1300   PLT 139 (L) 03/08/2024 1300   MCV 80.4 03/08/2024 1300   MCH 25.4 (L) 03/08/2024 1300   MCHC 31.6 03/08/2024 1300   RDW 16.8 (H) 03/08/2024 1300   LYMPHSABS 1.6 03/07/2024 2024   MONOABS 0.6 03/07/2024 2024   EOSABS 0.2 03/07/2024 2024   BASOSABS 0.0 03/07/2024 2024      Latest Ref Rng & Units 03/08/2024    1:00 PM 03/07/2024    8:24 PM 02/20/2024    3:59 PM  BMP  Glucose 70 - 99 mg/dL 81  98  869   BUN 6 - 20 mg/dL 87  83  65   Creatinine 0.44 - 1.00 mg/dL 1.68  2.54  3.74   Sodium 135 - 145 mmol/L 139  139  140   Potassium 3.5 - 5.1 mmol/L 4.1  4.0  4.3   Chloride 98 - 111 mmol/L 101  99  100   CO2 22 - 32 mmol/L 24  25  25    Calcium  8.9 - 10.3 mg/dL 7.5  8.4  8.8     ECHOCARDIOGRAM COMPLETE Result Date: 03/08/2024    ECHOCARDIOGRAM REPORT   Patient Name:   Allison Hill Date of Exam: 03/08/2024 Medical Rec #:  [id number]        Height:       63.0 in Accession #:    [id number]       Weight:       186.5 lb Date of Birth:  Jun 24, 1970        BSA:          1.877 m Patient Age:    53 years         BP:           184/110 mmHg Patient Gender: F                HR:           90 bpm. Exam Location:  [facility] Procedure: 2D Echo, Cardiac Doppler, Color Doppler and Strain Analysis (Both            Spectral and Color Flow Doppler were utilized during procedure). Indications:     Stroke I63.9  History:         Patient has prior history of Echocardiogram examinations, most                  recent 12/08/2023. Stroke.  Sonographer:     Rosina Dunk Referring Phys:  [id number] XILIN NIU Diagnosing Phys: Allison Hanson MD  Sonographer Comments: Global longitudinal strain was attempted. IMPRESSIONS  1. Left ventricular ejection fraction, by estimation, is 60 to 65%. The left ventricle has normal function. The left ventricle has no regional wall  motion abnormalities. There is severe asymmetric left ventricular hypertrophy of the inferior segment. Indeterminate diastolic filling due to E-A fusion. The average left ventricular global longitudinal strain is -13.3 %. The global longitudinal strain is abnormal.  2. Right ventricular systolic function is normal. The right ventricular size is normal. Mildly increased right ventricular wall thickness.  Tricuspid regurgitation signal is inadequate for assessing PA pressure.  3. Left atrial size was moderately dilated.  4. The mitral valve is normal in structure. Mild mitral valve regurgitation.  5. The aortic valve is tricuspid. There is mild calcification of the aortic valve. There is mild thickening of the aortic valve. Aortic valve regurgitation is mild.  6. The inferior vena cava is dilated in size with <50% respiratory variability, suggesting right atrial pressure of 15 mmHg. FINDINGS  Left Ventricle: Left ventricular ejection fraction, by estimation, is 60 to 65%. The left ventricle has normal function. The left ventricle has no regional wall motion abnormalities. The average left ventricular global longitudinal strain is -13.3 %. Strain was performed and the global longitudinal strain is abnormal. The left ventricular internal cavity size was normal in size. There is severe asymmetric left ventricular hypertrophy of the inferior segment. Indeterminate diastolic filling due to E-A  fusion. Right Ventricle: The right ventricular size is normal. Mildly increased right ventricular wall thickness. Right ventricular systolic function is normal. Tricuspid regurgitation signal is inadequate for assessing PA pressure. Left Atrium: Left atrial size was moderately dilated. Right Atrium: Right atrial size was normal in size. Pericardium: There is no evidence of pericardial effusion. Presence of epicardial fat layer. Mitral Valve: The mitral valve is normal in structure. Mild mitral valve regurgitation. MV peak gradient,  6.2 mmHg. The mean mitral valve gradient is 3.0 mmHg. Tricuspid Valve: The tricuspid valve is normal in structure. Tricuspid valve regurgitation is mild. Aortic Valve: The aortic valve is tricuspid. There is mild calcification of the aortic valve. There is mild thickening of the aortic valve. Aortic valve regurgitation is mild. Aortic valve mean gradient measures 9.0 mmHg. Aortic valve peak gradient measures 16.3 mmHg. Aortic valve area, by VTI measures 2.31 cm. Pulmonic Valve: The pulmonic valve was normal in structure. Pulmonic valve regurgitation is trivial. Aorta: The aortic root is normal in size and structure. Pulmonary Artery: The pulmonary artery is of normal size. Venous: The inferior vena cava is dilated in size with less than 50% respiratory variability, suggesting right atrial pressure of 15 mmHg. IAS/Shunts: The interatrial septum was not well visualized.  LEFT VENTRICLE PLAX 2D LVIDd:         5.10 cm      Diastology LVIDs:         3.40 cm      LV e' medial:  6.09 cm/s LV PW:         1.88 cm      LV e' lateral: 9.14 cm/s LV IVS:        1.20 cm LVOT diam:     2.15 cm      2D Longitudinal Strain LV SV:         82           2D Strain GLS Avg:     -13.3 % LV SV Index:   44 LVOT Area:     3.63 cm  LV Volumes (MOD) LV vol d, MOD A2C: 79.2 ml LV vol d, MOD A4C: 105.0 ml LV vol s, MOD A2C: 25.4 ml LV vol s, MOD A4C: 59.0 ml LV SV MOD A2C:     53.8 ml LV SV MOD A4C:     105.0 ml LV SV MOD BP:      52.8 ml RIGHT VENTRICLE             IVC RV Basal diam:  4.10 cm     IVC diam: 2.50 cm  RV Mid diam:    3.40 cm RV S prime:     14.10 cm/s TAPSE (M-mode): 1.9 cm LEFT ATRIUM             Index        RIGHT ATRIUM           Index LA diam:        3.50 cm 1.86 cm/m   RA Area:     14.70 cm LA Vol (A2C):   91.9 ml 48.96 ml/m  RA Volume:   33.50 ml  17.85 ml/m LA Vol (A4C):   83.7 ml 44.59 ml/m LA Biplane Vol: 86.6 ml 46.14 ml/m  AORTIC VALVE                     PULMONIC VALVE AV Area (Vmax):    2.50 cm      PV Vmax:         1.47 m/s AV Area (Vmean):   2.27 cm      PV Vmean:       101.000 cm/s AV Area (VTI):     2.31 cm      PV VTI:         0.269 m AV Vmax:           202.00 cm/s   PV Peak grad:   8.6 mmHg AV Vmean:          136.000 cm/s  PV Mean grad:   5.0 mmHg AV VTI:            0.357 m       RVOT Peak grad: 3 mmHg AV Peak Grad:      16.3 mmHg AV Mean Grad:      9.0 mmHg LVOT Vmax:         139.00 cm/s LVOT Vmean:        84.900 cm/s LVOT VTI:          0.227 m LVOT/AV VTI ratio: 0.64  AORTA Ao Root diam: 3.40 cm MITRAL VALVE MV Area VTI:  3.99 cm      SHUNTS MV Peak grad: 6.2 mmHg      Systemic VTI:  0.23 m MV Mean grad: 3.0 mmHg      Systemic Diam: 2.15 cm MV Vmax:      1.25 m/s      Pulmonic VTI:  0.202 m MV Vmean:     84.2 cm/s MV A velocity: 113.00 cm/s Allison Hanson MD Electronically signed by Allison Hanson MD Signature Date/Time: 03/08/2024/5:45:43 PM    Final    MR BRAIN WO CONTRAST Result Date: 03/08/2024 EXAM: MRI BRAIN WITHOUT CONTRAST 03/08/2024 02:33:09 AM TECHNIQUE: Multiplanar multisequence MRI of the head/brain was performed without the administration of intravenous contrast. COMPARISON: 06/11/2023 CLINICAL HISTORY: Acute neuro deficit, stroke suspected. 53 year old female with history of intracranial hemorrhage, stroke with left-sided weakness, hypertension, decompensated congestive heart failure, thrombocytopenia, end-stage renal disease on hemodialysis, vertigo, obesity, former smoker, and cocaine abuse, presenting with difficulty speaking and unsteady gait. FINDINGS: BRAIN AND VENTRICLES: No acute infarct, intracranial hemorrhage, mass, midline shift, or hydrocephalus. Numerous chronic microhemorrhages in a predominantly central distribution consistent with chronic hypertensive angiopathy. Multifocal hyperintense T2-weighted signal within the cerebral white matter, most commonly due to chronic small vessel ischemic disease. Multiple small vessel infarcts of the deep gray nuclei. The sella is  unremarkable. Normal flow voids. ORBITS: No acute abnormality. SINUSES AND MASTOIDS: Right mastoid effusion. BONES AND SOFT TISSUES: Normal marrow signal. No acute  soft tissue abnormality. IMPRESSION: 1. No acute intracranial abnormality. 2. Findings consistent with chronic hypertensive angiopathy and chronic small vessel ischemic disease, with numerous chronic microhemorrhages and multifocal white matter T2 hyperintensities. 3. Multiple chronic small vessel infarcts involving the deep gray nuclei. Electronically signed by: Allison Stanford MD 03/08/2024 03:07 AM EDT RP Workstation: [id number]   CT ANGIO HEAD NECK W WO CM W PERF (CODE STROKE) Result Date: 03/07/2024 EXAM: CTA Head and Neck with Perfusion 03/07/2024 08:58:26 PM TECHNIQUE: CTA of the head and neck was performed without and with the administration of 100 mL of iohexol  (OMNIPAQUE ) 350 MG/ML injection. 3D postprocessing with multiplanar reconstructions and MIPs was performed to evaluate the vascular anatomy. Cerebral perfusion analysis using computed tomography with contrast administration, including post-processing of parametric maps with determination of cerebral blood flow, cerebral blood volume, mean transit time and time-to-maximum. Automated exposure control, iterative reconstruction, and/or weight based adjustment of the mA/kV was utilized to reduce the radiation dose to as low as reasonably achievable. COMPARISON: 01/31/2024 CLINICAL HISTORY: Neuro deficit, acute, stroke suspected. Code stroke. FINDINGS: CTA NECK: AORTIC ARCH AND ARCH VESSELS: Calcific aortic atherosclerosis. No dissection or arterial injury. No significant stenosis of the brachiocephalic or subclavian arteries. CERVICAL CAROTID ARTERIES: No dissection, arterial injury, or hemodynamically significant stenosis by NASCET criteria. CERVICAL VERTEBRAL ARTERIES: No dissection, arterial injury, or significant stenosis. LUNGS AND MEDIASTINUM: Unremarkable. SOFT TISSUES: No acute  abnormality. BONES: No acute abnormality. CTA HEAD: ANTERIOR CIRCULATION: No significant stenosis of the internal, anterior, or middle cerebral arteries. No aneurysm. POSTERIOR CIRCULATION: No significant stenosis of the posterior cerebral, basilar, or vertebral arteries. No aneurysm. OTHER: No dural venous sinus thrombosis on this non-dedicated study. CT PERFUSION: EXAM QUALITY: Exam quality is adequate with diagnostic perfusion maps. No significant motion artifact. Appropriate arterial inflow and venous outflow curves. CORE INFARCT (CBF<30% volume): 0 mL TOTAL HYPOPERFUSION (Tmax>6s volume): 0 mL PENUMBRA: Mismatch volume: 0 mL Mismatch ratio: not applicable Location: not applicable IMPRESSION: 1. No evidence of ischemia by CT brain perfusion. 2. No acute large vessel occlusion. 3. No hemodynamically significant stenosis or aneurysm in the head or neck vessels. Electronically signed by: Allison Stanford MD 03/07/2024 09:07 PM EDT RP Workstation: [id number]   CT HEAD CODE STROKE WO CONTRAST Addendum Date: 03/07/2024 ADDENDUM REPORT: 03/07/2024 19:36 ADDENDUM: No evidence of an acute intracranial abnormality. These results were called by telephone on 03/07/2024 at 7:36 pm to provider MICHAEL MIAN , who verbally acknowledged these results. Electronically Signed   By: Rockey Childs D.O.   On: 03/07/2024 19:36   Result Date: 03/07/2024 CLINICAL DATA:  Code stroke. Neuro deficit, acute, stroke suspected. EXAM: CT HEAD WITHOUT CONTRAST TECHNIQUE: Contiguous axial images were obtained from the base of the skull through the vertex without intravenous contrast. RADIATION DOSE REDUCTION: This exam was performed according to the departmental dose-optimization program which includes automated exposure control, adjustment of the mA and/or kV according to patient size and/or use of iterative reconstruction technique. COMPARISON:  Brain MRI 02/01/2024. Head CT 01/31/2024. FINDINGS: Brain: Mild generalized parenchymal atrophy.  Known small chronic cortically-based infarcts within the left parietal lobe and at the right temporo-occipital junction Known chronic lacunar infarcts within the bilateral cerebral hemispheric white matter, within/about the bilateral deep gray nuclei and within the left aspect of the pons, some of which were better appreciated on the prior brain MRI of 02/01/2024. Background age-advanced cerebral white matter chronic small vessel ischemic disease. The tiny chronic cerebellar infarcts demonstrated on the prior MRI are occult by CT.  There is no acute intracranial hemorrhage. No acute demarcated cortical infarct. No extra-axial fluid collection. No evidence of an intracranial mass. No midline shift. Vascular: No hyperdense vessel. Atherosclerotic calcifications. Skull: No calvarial fracture or aggressive osseous lesion. Sinuses/Orbits: No mass or acute finding within the imaged orbits. No significant paranasal sinus disease at the imaged levels. Other: Trace fluid within right mastoid air cells. ASPECTS [facility] Stroke Program Early CT Score) - Ganglionic level infarction (caudate, lentiform nuclei, internal capsule, insula, M1-M3 cortex): 7 - Supraganglionic infarction (M4-M6 cortex): 3 Total score (0-10 with 10 being normal): 10 (when discounting chronic infarcts). Attempts are being made to reach the ordering provider at this time. IMPRESSION: 1. No evidence of an acute intracranial abnormality. 2. Parenchymal atrophy, advanced chronic small vessel ischemic disease and unchanged chronic infarcts, as described within the body of the report. 3. Trace right mastoid effusion. Electronically Signed: By: Rockey Childs D.O. On: 03/07/2024 19:33    Disposition Plan & Communication  Patient status: Observation  [facility] Anticipated discharge date: 10/9 pending BP improvement, HD  Family Communication: none at bedside    Author: Marien LITTIE Piety, DO [facility] 03/09/2024, 7:33 AM   Available by Epic secure chat 7AM-7PM. If 7PM-7AM, please contact night-coverage.  [facility] contact information found on [email or web address].

## 2024-03-09 NOTE — Progress Notes (Signed)
 MEWS Progress Note  Patient Details Name: Allison Hill MRN: [id number] DOB: [birth year] Today's Date: 03/09/2024   MEWS Flowsheet Documentation:  Assess: MEWS Score Temp: 98.4 F (36.9 C) BP: (!) 182/111 MAP (mmHg): 133 Pulse Rate: 96 ECG Heart Rate: 97 Resp: 18 Level of Consciousness: Alert SpO2: 100 % O2 Device: Room Air Patient Activity (if Appropriate): In bed Assess: MEWS Score MEWS Temp: 0 MEWS Systolic: 0 MEWS Pulse: 0 MEWS RR: 0 MEWS LOC: 0 MEWS Score: 0 MEWS Score Color: Green Assess: SIRS CRITERIA SIRS Temperature : 0 SIRS Respirations : 0 SIRS Pulse: 1 SIRS WBC: 0 SIRS Score Sum : 1 SIRS Temperature : 0 SIRS Pulse: 1 SIRS Respirations : 0 SIRS WBC: 0 SIRS Score Sum : 1 Assess: if the MEWS score is Yellow or Red Were vital signs accurate and taken at a resting state?: Yes Does the patient meet 2 or more of the SIRS criteria?: No MEWS guidelines implemented :  (Pt had a stroke and has permissive HTN)    Pt given 10mg  of Norvasc  at 2018, along with 10mg  of  IV Hydralazine  at 2035.  Blood pressure to be rechecked for 2200 med pass.  Pt to get 50mg  of Hydralazine  PO.  Will assess again after 2200 med pass for additional need of IV hydralazine .    Lamar Satterfield 03/09/2024, 2:48 AM

## 2024-03-09 NOTE — Plan of Care (Signed)

## 2024-03-10 DIAGNOSIS — I10 Essential (primary) hypertension: Secondary | ICD-10-CM | POA: Diagnosis not present

## 2024-03-10 DIAGNOSIS — I6389 Other cerebral infarction: Secondary | ICD-10-CM | POA: Diagnosis not present

## 2024-03-10 DIAGNOSIS — N186 End stage renal disease: Secondary | ICD-10-CM | POA: Diagnosis not present

## 2024-03-10 LAB — TYPE AND SCREEN
ABO/RH(D): A POS
Antibody Screen: NEGATIVE

## 2024-03-10 LAB — RENAL FUNCTION PANEL
Albumin: 3.5 g/dL (ref 3.5–5.0)
Anion gap: 10 (ref 5–15)
BUN: 69 mg/dL — ABNORMAL HIGH (ref 6–20)
CO2: 25 mmol/L (ref 22–32)
Calcium: 7.8 mg/dL — ABNORMAL LOW (ref 8.9–10.3)
Chloride: 106 mmol/L (ref 98–111)
Creatinine, Ser: 6.67 mg/dL — ABNORMAL HIGH (ref 0.44–1.00)
GFR, Estimated: 7 mL/min — ABNORMAL LOW (ref >60)
Glucose, Bld: 80 mg/dL (ref 70–99)
Phosphorus: 3.3 mg/dL (ref 2.5–4.6)
Potassium: 3.6 mmol/L (ref 3.5–5.1)
Sodium: 141 mmol/L (ref 135–145)

## 2024-03-10 MED ORDER — CLONIDINE HCL 0.3 MG/24HR TD PTWK
0.3000 mg | MEDICATED_PATCH | TRANSDERMAL | Status: DC
  Administered 2024-03-10: 0.3 mg via TRANSDERMAL
  Filled 2024-03-10: qty 1

## 2024-03-10 MED ORDER — HEPARIN SODIUM (PORCINE) 1000 UNIT/ML IJ SOLN
INTRAMUSCULAR | Status: AC
  Filled 2024-03-10: qty 4

## 2024-03-10 MED ORDER — CLOPIDOGREL BISULFATE 75 MG PO TABS: 75.0000 mg | ORAL_TABLET | Freq: Every day | ORAL | Status: AC

## 2024-03-10 MED ORDER — ASPIRIN 325 MG PO TBEC: 325.0000 mg | DELAYED_RELEASE_TABLET | Freq: Every day | ORAL | Status: DC

## 2024-03-10 MED ORDER — OXYCODONE-ACETAMINOPHEN 5-325 MG PO TABS
1.0000 | ORAL_TABLET | Freq: Once | ORAL | Status: AC
  Administered 2024-03-10: 1 via ORAL
  Filled 2024-03-10: qty 1

## 2024-03-10 MED ORDER — LABETALOL HCL 200 MG PO TABS
400.0000 mg | ORAL_TABLET | Freq: Two times a day (BID) | ORAL | Status: DC
Start: 2024-03-10 — End: 2024-03-10
  Filled 2024-03-10: qty 2

## 2024-03-10 NOTE — TOC Transition Note (Signed)
 Transition of Care [facility]) - Discharge Note   Patient Details  Name: Allison Hill MRN: [id number] Date of Birth: Nov 07, 1970  [facility]) CM/SW Contact:  Marinda Cooks, RN Phone Number: 03/10/2024, 3:14 PM   Clinical Narrative:    This CM updated by covering MD pt medically cleared to dc today and has active DC order . This CM spoke with [facility] Admission liaison @ [facility] and confirmed that pt has a LTC bed at facility and dc back to SNF/Rehab today .  DC transportation confirmed for pt with [facility] team updated . No additional DC needs requested by medical team or identified by CM at this time .     Final next level of care: Home w Home Health Services Barriers to Discharge: No Barriers Identified   Discharge Placement  Returning to LTC bed at [facility]             Patient chooses bed at: Other - please specify in the comment section below: ([facility]) Patient to be transferred to facility by: Ambulance   Patient and family notified of of transfer: 03/10/24    Social Drivers of Health (SDOH) Interventions SDOH Screenings   Food Insecurity: No Food Insecurity (03/08/2024)  Housing: Low Risk  (03/08/2024)  Transportation Needs: No Transportation Needs (03/08/2024)  Utilities: Not At Risk (03/08/2024)  Financial Resource Strain: Patient Declined (01/03/2023)   Received from [facility]  Social Connections: Patient Declined (03/08/2024)  Tobacco Use: Medium Risk (03/08/2024)     Readmission Risk Interventions    02/01/2024   12:28 PM  Readmission Risk Prevention Plan  Transportation Screening Complete  Medication Review (RN Care Manager) Complete  PCP or Specialist appointment within 3-5 days of discharge Complete  Palliative Care Screening Not Applicable  Skilled Nursing Facility Complete

## 2024-03-10 NOTE — Progress Notes (Signed)
 I called report to [phone] and spoke to Nurse Erin assuming care. Pt returning to [facility] to Room F-16 Patient sent with Right HD port. All patient belongings sent with patient.

## 2024-03-10 NOTE — Progress Notes (Signed)
 D/C order noted. Pt receives HD treatment at [facility] on [facility]. Navigator will assist as needed   Suzen Satchel Dialysis Navigator [phone]

## 2024-03-10 NOTE — Discharge Summary (Signed)
 Physician Discharge Summary  Patient: Allison Hill FMW:[id number] DOB: 08-Sep-1970   Code Status: Full Code Admit date: 03/07/2024 Discharge date: 03/10/2024 Disposition: Skilled nursing facility, PT, OT, nurse aid, and RN PCP: Patient, No Pcp Per  Recommendations for Outpatient Follow-up:  Follow up with PCP within 1-2 weeks Regarding general hospital follow up and preventative care Recommend monitoring BP on current regimen Follow up with nephrology for routine HD  Discharge Diagnoses:  Principal Problem:   Stroke [facility]) Active Problems:   ESRD on dialysis (HCC)   HTN (hypertension)   Hypertension   Chronic diastolic CHF (congestive heart failure) (HCC)   Thrombocytopenia   Cocaine use   Obesity (BMI 30-39.9)  Brief Hospital Course Summary: Allison Hill is a 53 y.o. female with a PMH significant for ICH, stroke with left-sided weakness, HTN, dCHF, thrombocytopenia, ESRD-HD ([facility]), vertigo, obesity, former smoker, cocaine abuse, who presents with difficulty speaking, unsteady gait. CT head- no acute changes CTA- No evidence of ischemia by CT brain perfusion. MRI brain- No acute intracranial abnormality. Findings consistent with chronic hypertensive angiopathy and chronic small vessel ischemic disease, with numerous chronic microhemorrhages and multifocal white matter T2 hyperintensities. Multiple chronic small vessel infarcts involving the deep gray nuclei.   Neurology was consulted to evaluate given high risk of stroke and typical presentation. Recommended 3 weeks of DAPT followed by aspirin  alone and continuing atorvastatin  and blood pressure control.    Symptoms have resolved back to baseline. BP remains significantly elevated but when returned to her full home medication list, BP improved significantly.  Nephrology following for HD and completed routine sessions including today.   All other chronic conditions were treated with home medications.    Discharge  Condition: Stable, improved Recommended discharge diet: Renal diet  Consultations: Nephrology  Neurology   Procedures/Studies: HD Brain MRI  Allergies as of 03/10/2024       Reactions   Ceftriaxone Shortness Of Breath, Hives   Allergy, hives   Shellfish Allergy Hives   Minoxidil Swelling        Medication List     STOP taking these medications    senna-docusate 8.6-50 MG tablet Commonly known as: Senokot-S       TAKE these medications    acetaminophen  500 MG tablet Commonly known as: TYLENOL  Take 500 mg by mouth 3 (three) times daily.   amLODipine  10 MG tablet Commonly known as: NORVASC  Take 1 tablet (10 mg total) by mouth daily.   aspirin  EC 325 MG tablet Take 1 tablet (325 mg total) by mouth daily. Start taking on: March 11, 2024   atorvastatin  40 MG tablet Commonly known as: LIPITOR Take 40 mg by mouth every evening.   calcitRIOL  0.25 MCG capsule Commonly known as: ROCALTROL  Take 1 capsule (0.25 mcg total) by mouth 4 (four) times a week. Take one capsule by mouth every Mon, Tue, Wed, Fri with dialysis What changed:  how much to take when to take this additional instructions   calcium  carbonate 500 MG chewable tablet Commonly known as: TUMS - dosed in mg elemental calcium  Chew 2 tablets (400 mg of elemental calcium  total) by mouth 2 (two) times daily.   cinacalcet  60 MG tablet Commonly known as: SENSIPAR  Take 60 mg by mouth daily with supper.   cloNIDine  0.3 mg/24hr patch Commonly known as: CATAPRES  - Dosed in mg/24 hr Place 0.3 mg onto the skin every Friday.   clopidogrel 75 MG tablet Commonly known as: PLAVIX Take 1 tablet (75 mg total) by mouth daily  for 20 days. Start taking on: March 11, 2024   epoetin  alfa 4000 UNIT/ML injection Commonly known as: EPOGEN  Inject 1 mL (4,000 Units total) into the vein Every Tuesday,Thursday,and Saturday with dialysis. What changed:  when to take this additional instructions   fluticasone  50  MCG/ACT nasal spray Commonly known as: FLONASE  Place 1 spray into both nostrils daily as needed for allergies or rhinitis.   gabapentin  100 MG capsule Commonly known as: NEURONTIN  Take 200 mg by mouth 3 (three) times daily.   hydrALAZINE  50 MG tablet Commonly known as: APRESOLINE  Take 1 tablet (50 mg total) by mouth every 8 (eight) hours.   irbesartan  300 MG tablet Commonly known as: AVAPRO  Take 1 tablet (300 mg total) by mouth daily.   isosorbide  mononitrate 120 MG 24 hr tablet Commonly known as: IMDUR  Take 120 mg by mouth every evening.   labetalol  200 MG tablet Commonly known as: NORMODYNE  Take 1 tablet (200 mg total) by mouth 2 (two) times daily. What changed: how much to take   lidocaine  4 % Place 1 patch onto the skin every evening. Remove & Discard patch within 12 hours or as directed by MD. Apply to lower back in the night and remove at the morning.   melatonin 3 MG Tabs tablet Take 3 mg by mouth at bedtime.   pantoprazole  40 MG tablet Commonly known as: PROTONIX  Take 1 tablet (40 mg total) by mouth daily.   polyethylene glycol 17 g packet Commonly known as: MIRALAX  / GLYCOLAX  Take 17 g by mouth in the morning. Scheduled   polyethylene glycol 17 g packet Commonly known as: MIRALAX  / GLYCOLAX  Take 17 g by mouth daily as needed.   sevelamer  carbonate 800 MG tablet Commonly known as: RENVELA  Take 1,600 mg by mouth 3 (three) times daily with meals.   traZODone 50 MG tablet Commonly known as: DESYREL Take 50 mg by mouth at bedtime.   Voltaren  1 % Gel Generic drug: diclofenac  Sodium Apply 2 g topically 2 (two) times daily. Apply to left shoulder.         Subjective   Pt reports feeling well today. She denies headache, SOB, chest pain. She denies any new weakness. She wants to be discharged today.  All questions and concerns were addressed at time of discharge.  Objective  Blood pressure 112/76, pulse 81, temperature 98.4 F (36.9 C), temperature  source Oral, resp. rate (!) 21, height 5' 3 (1.6 m), weight 77.8 kg, SpO2 99%.   General: Pt is alert, awake, not in acute distress Cardiovascular: RRR, S1/S2 +, no rubs, no gallops Respiratory: CTA bilaterally, no wheezing, no rhonchi Abdominal: Soft, NT, ND, bowel sounds + Extremities: no edema, no cyanosis  The results of significant diagnostics from this hospitalization (including imaging, microbiology, ancillary and laboratory) are listed below for reference.   Imaging studies: ECHOCARDIOGRAM COMPLETE Result Date: 03/08/2024    ECHOCARDIOGRAM REPORT   Patient Name:   DOMINO HOLTEN Date of Exam: 03/08/2024 Medical Rec #:  [id number]        Height:       63.0 in Accession #:    [id number]       Weight:       186.5 lb Date of Birth:  February 28, 1971        BSA:          1.877 m Patient Age:    53 years         BP:  184/110 mmHg Patient Gender: F                HR:           90 bpm. Exam Location:  [facility] Procedure: 2D Echo, Cardiac Doppler, Color Doppler and Strain Analysis (Both            Spectral and Color Flow Doppler were utilized during procedure). Indications:     Stroke I63.9  History:         Patient has prior history of Echocardiogram examinations, most                  recent 12/08/2023. Stroke.  Sonographer:     Rosina Dunk Referring Phys:  [id number] XILIN NIU Diagnosing Phys: Lonni Hanson MD  Sonographer Comments: Global longitudinal strain was attempted. IMPRESSIONS  1. Left ventricular ejection fraction, by estimation, is 60 to 65%. The left ventricle has normal function. The left ventricle has no regional wall motion abnormalities. There is severe asymmetric left ventricular hypertrophy of the inferior segment. Indeterminate diastolic filling due to E-A fusion. The average left ventricular global longitudinal strain is -13.3 %. The global longitudinal strain is abnormal.  2. Right ventricular systolic function is normal. The right ventricular size is normal. Mildly increased  right ventricular wall thickness. Tricuspid regurgitation signal is inadequate for assessing PA pressure.  3. Left atrial size was moderately dilated.  4. The mitral valve is normal in structure. Mild mitral valve regurgitation.  5. The aortic valve is tricuspid. There is mild calcification of the aortic valve. There is mild thickening of the aortic valve. Aortic valve regurgitation is mild.  6. The inferior vena cava is dilated in size with <50% respiratory variability, suggesting right atrial pressure of 15 mmHg. FINDINGS  Left Ventricle: Left ventricular ejection fraction, by estimation, is 60 to 65%. The left ventricle has normal function. The left ventricle has no regional wall motion abnormalities. The average left ventricular global longitudinal strain is -13.3 %. Strain was performed and the global longitudinal strain is abnormal. The left ventricular internal cavity size was normal in size. There is severe asymmetric left ventricular hypertrophy of the inferior segment. Indeterminate diastolic filling due to E-A  fusion. Right Ventricle: The right ventricular size is normal. Mildly increased right ventricular wall thickness. Right ventricular systolic function is normal. Tricuspid regurgitation signal is inadequate for assessing PA pressure. Left Atrium: Left atrial size was moderately dilated. Right Atrium: Right atrial size was normal in size. Pericardium: There is no evidence of pericardial effusion. Presence of epicardial fat layer. Mitral Valve: The mitral valve is normal in structure. Mild mitral valve regurgitation. MV peak gradient, 6.2 mmHg. The mean mitral valve gradient is 3.0 mmHg. Tricuspid Valve: The tricuspid valve is normal in structure. Tricuspid valve regurgitation is mild. Aortic Valve: The aortic valve is tricuspid. There is mild calcification of the aortic valve. There is mild thickening of the aortic valve. Aortic valve regurgitation is mild. Aortic valve mean gradient measures 9.0  mmHg. Aortic valve peak gradient measures 16.3 mmHg. Aortic valve area, by VTI measures 2.31 cm. Pulmonic Valve: The pulmonic valve was normal in structure. Pulmonic valve regurgitation is trivial. Aorta: The aortic root is normal in size and structure. Pulmonary Artery: The pulmonary artery is of normal size. Venous: The inferior vena cava is dilated in size with less than 50% respiratory variability, suggesting right atrial pressure of 15 mmHg. IAS/Shunts: The interatrial septum was not well visualized.  LEFT VENTRICLE PLAX 2D LVIDd:  5.10 cm      Diastology LVIDs:         3.40 cm      LV e' medial:  6.09 cm/s LV PW:         1.88 cm      LV e' lateral: 9.14 cm/s LV IVS:        1.20 cm LVOT diam:     2.15 cm      2D Longitudinal Strain LV SV:         82           2D Strain GLS Avg:     -13.3 % LV SV Index:   44 LVOT Area:     3.63 cm  LV Volumes (MOD) LV vol d, MOD A2C: 79.2 ml LV vol d, MOD A4C: 105.0 ml LV vol s, MOD A2C: 25.4 ml LV vol s, MOD A4C: 59.0 ml LV SV MOD A2C:     53.8 ml LV SV MOD A4C:     105.0 ml LV SV MOD BP:      52.8 ml RIGHT VENTRICLE             IVC RV Basal diam:  4.10 cm     IVC diam: 2.50 cm RV Mid diam:    3.40 cm RV S prime:     14.10 cm/s TAPSE (M-mode): 1.9 cm LEFT ATRIUM             Index        RIGHT ATRIUM           Index LA diam:        3.50 cm 1.86 cm/m   RA Area:     14.70 cm LA Vol (A2C):   91.9 ml 48.96 ml/m  RA Volume:   33.50 ml  17.85 ml/m LA Vol (A4C):   83.7 ml 44.59 ml/m LA Biplane Vol: 86.6 ml 46.14 ml/m  AORTIC VALVE                     PULMONIC VALVE AV Area (Vmax):    2.50 cm      PV Vmax:        1.47 m/s AV Area (Vmean):   2.27 cm      PV Vmean:       101.000 cm/s AV Area (VTI):     2.31 cm      PV VTI:         0.269 m AV Vmax:           202.00 cm/s   PV Peak grad:   8.6 mmHg AV Vmean:          136.000 cm/s  PV Mean grad:   5.0 mmHg AV VTI:            0.357 m       RVOT Peak grad: 3 mmHg AV Peak Grad:      16.3 mmHg AV Mean Grad:      9.0 mmHg LVOT  Vmax:         139.00 cm/s LVOT Vmean:        84.900 cm/s LVOT VTI:          0.227 m LVOT/AV VTI ratio: 0.64  AORTA Ao Root diam: 3.40 cm MITRAL VALVE MV Area VTI:  3.99 cm      SHUNTS MV Peak grad: 6.2 mmHg      Systemic VTI:  0.23 m MV Mean grad: 3.0 mmHg  Systemic Diam: 2.15 cm MV Vmax:      1.25 m/s      Pulmonic VTI:  0.202 m MV Vmean:     84.2 cm/s MV A velocity: 113.00 cm/s Lonni Hanson MD Electronically signed by Lonni Hanson MD Signature Date/Time: 03/08/2024/5:45:43 PM    Final    MR BRAIN WO CONTRAST Result Date: 03/08/2024 EXAM: MRI BRAIN WITHOUT CONTRAST 03/08/2024 02:33:09 AM TECHNIQUE: Multiplanar multisequence MRI of the head/brain was performed without the administration of intravenous contrast. COMPARISON: 06/11/2023 CLINICAL HISTORY: Acute neuro deficit, stroke suspected. 53 year old female with history of intracranial hemorrhage, stroke with left-sided weakness, hypertension, decompensated congestive heart failure, thrombocytopenia, end-stage renal disease on hemodialysis, vertigo, obesity, former smoker, and cocaine abuse, presenting with difficulty speaking and unsteady gait. FINDINGS: BRAIN AND VENTRICLES: No acute infarct, intracranial hemorrhage, mass, midline shift, or hydrocephalus. Numerous chronic microhemorrhages in a predominantly central distribution consistent with chronic hypertensive angiopathy. Multifocal hyperintense T2-weighted signal within the cerebral white matter, most commonly due to chronic small vessel ischemic disease. Multiple small vessel infarcts of the deep gray nuclei. The sella is unremarkable. Normal flow voids. ORBITS: No acute abnormality. SINUSES AND MASTOIDS: Right mastoid effusion. BONES AND SOFT TISSUES: Normal marrow signal. No acute soft tissue abnormality. IMPRESSION: 1. No acute intracranial abnormality. 2. Findings consistent with chronic hypertensive angiopathy and chronic small vessel ischemic disease, with numerous chronic  microhemorrhages and multifocal white matter T2 hyperintensities. 3. Multiple chronic small vessel infarcts involving the deep gray nuclei. Electronically signed by: Franky Stanford MD 03/08/2024 03:07 AM EDT RP Workstation: [id number]   CT ANGIO HEAD NECK W WO CM W PERF (CODE STROKE) Result Date: 03/07/2024 EXAM: CTA Head and Neck with Perfusion 03/07/2024 08:58:26 PM TECHNIQUE: CTA of the head and neck was performed without and with the administration of 100 mL of iohexol  (OMNIPAQUE ) 350 MG/ML injection. 3D postprocessing with multiplanar reconstructions and MIPs was performed to evaluate the vascular anatomy. Cerebral perfusion analysis using computed tomography with contrast administration, including post-processing of parametric maps with determination of cerebral blood flow, cerebral blood volume, mean transit time and time-to-maximum. Automated exposure control, iterative reconstruction, and/or weight based adjustment of the mA/kV was utilized to reduce the radiation dose to as low as reasonably achievable. COMPARISON: 01/31/2024 CLINICAL HISTORY: Neuro deficit, acute, stroke suspected. Code stroke. FINDINGS: CTA NECK: AORTIC ARCH AND ARCH VESSELS: Calcific aortic atherosclerosis. No dissection or arterial injury. No significant stenosis of the brachiocephalic or subclavian arteries. CERVICAL CAROTID ARTERIES: No dissection, arterial injury, or hemodynamically significant stenosis by NASCET criteria. CERVICAL VERTEBRAL ARTERIES: No dissection, arterial injury, or significant stenosis. LUNGS AND MEDIASTINUM: Unremarkable. SOFT TISSUES: No acute abnormality. BONES: No acute abnormality. CTA HEAD: ANTERIOR CIRCULATION: No significant stenosis of the internal, anterior, or middle cerebral arteries. No aneurysm. POSTERIOR CIRCULATION: No significant stenosis of the posterior cerebral, basilar, or vertebral arteries. No aneurysm. OTHER: No dural venous sinus thrombosis on this non-dedicated study. CT PERFUSION:  EXAM QUALITY: Exam quality is adequate with diagnostic perfusion maps. No significant motion artifact. Appropriate arterial inflow and venous outflow curves. CORE INFARCT (CBF<30% volume): 0 mL TOTAL HYPOPERFUSION (Tmax>6s volume): 0 mL PENUMBRA: Mismatch volume: 0 mL Mismatch ratio: not applicable Location: not applicable IMPRESSION: 1. No evidence of ischemia by CT brain perfusion. 2. No acute large vessel occlusion. 3. No hemodynamically significant stenosis or aneurysm in the head or neck vessels. Electronically signed by: Franky Stanford MD 03/07/2024 09:07 PM EDT RP Workstation: [id number]   CT HEAD CODE STROKE WO CONTRAST Addendum Date: 03/07/2024  ADDENDUM REPORT: 03/07/2024 19:36 ADDENDUM: No evidence of an acute intracranial abnormality. These results were called by telephone on 03/07/2024 at 7:36 pm to provider MICHAEL MIAN , who verbally acknowledged these results. Electronically Signed   By: Rockey Childs D.O.   On: 03/07/2024 19:36   Result Date: 03/07/2024 CLINICAL DATA:  Code stroke. Neuro deficit, acute, stroke suspected. EXAM: CT HEAD WITHOUT CONTRAST TECHNIQUE: Contiguous axial images were obtained from the base of the skull through the vertex without intravenous contrast. RADIATION DOSE REDUCTION: This exam was performed according to the departmental dose-optimization program which includes automated exposure control, adjustment of the mA and/or kV according to patient size and/or use of iterative reconstruction technique. COMPARISON:  Brain MRI 02/01/2024. Head CT 01/31/2024. FINDINGS: Brain: Mild generalized parenchymal atrophy. Known small chronic cortically-based infarcts within the left parietal lobe and at the right temporo-occipital junction Known chronic lacunar infarcts within the bilateral cerebral hemispheric white matter, within/about the bilateral deep gray nuclei and within the left aspect of the pons, some of which were better appreciated on the prior brain MRI of 02/01/2024.  Background age-advanced cerebral white matter chronic small vessel ischemic disease. The tiny chronic cerebellar infarcts demonstrated on the prior MRI are occult by CT. There is no acute intracranial hemorrhage. No acute demarcated cortical infarct. No extra-axial fluid collection. No evidence of an intracranial mass. No midline shift. Vascular: No hyperdense vessel. Atherosclerotic calcifications. Skull: No calvarial fracture or aggressive osseous lesion. Sinuses/Orbits: No mass or acute finding within the imaged orbits. No significant paranasal sinus disease at the imaged levels. Other: Trace fluid within right mastoid air cells. ASPECTS [facility] Stroke Program Early CT Score) - Ganglionic level infarction (caudate, lentiform nuclei, internal capsule, insula, M1-M3 cortex): 7 - Supraganglionic infarction (M4-M6 cortex): 3 Total score (0-10 with 10 being normal): 10 (when discounting chronic infarcts). Attempts are being made to reach the ordering provider at this time. IMPRESSION: 1. No evidence of an acute intracranial abnormality. 2. Parenchymal atrophy, advanced chronic small vessel ischemic disease and unchanged chronic infarcts, as described within the body of the report. 3. Trace right mastoid effusion. Electronically Signed: By: Rockey Childs D.O. On: 03/07/2024 19:33   DG Chest 2 View Result Date: 02/20/2024 CLINICAL DATA:  Shortness of breath. EXAM: CHEST - 2 VIEW COMPARISON:  12/06/2023. FINDINGS: Bilateral lung fields are clear. Bilateral costophrenic angles are clear. Normal cardio-mediastinal silhouette. No acute osseous abnormalities. The soft tissues are within normal limits. Right IJ/subclavian hemodialysis catheter noted with its tip overlying the cavoatrial junction region. IMPRESSION: No active cardiopulmonary disease. Electronically Signed   By: Ree Molt M.D.   On: 02/20/2024 13:48   VAS US  UPPER EXT VEIN MAPPING (PRE-OP  AVF) Result Date: 02/15/2024 UPPER EXTREMITY VEIN MAPPING  Patient Name:  MILLI WOOLRIDGE  Date of Exam:   02/11/2024 Medical Rec #: [id number]         Accession #:    [id number] Date of Birth: 11-15-1970         Patient Gender: F Patient Age:   9 years Exam Location:  [facility] Procedure:      VAS US  UPPER EXT VEIN MAPPING (PRE-OP  AVF) Referring Phys: CORDELLA SHAWL --------------------------------------------------------------------------------  Indications: Pre-access. History: Esrd.  Performing Technologist: Jerel Croak RVT  Examination Guidelines: A complete evaluation includes B-mode imaging, spectral Doppler, color Doppler, and power Doppler as needed of all accessible portions of each vessel. Bilateral testing is considered an integral part of a complete examination. Limited examinations for reoccurring indications may  be performed as noted. +-----------------+-------------+----------+--------------+ Right Cephalic   Diameter (cm)Depth (cm)   Findings    +-----------------+-------------+----------+--------------+ Prox upper arm       0.09                              +-----------------+-------------+----------+--------------+ Mid upper arm        0.15                              +-----------------+-------------+----------+--------------+ Dist upper arm       0.11                              +-----------------+-------------+----------+--------------+ Antecubital fossa    0.10                              +-----------------+-------------+----------+--------------+ Prox forearm         0.09                              +-----------------+-------------+----------+--------------+ Mid forearm          0.06                              +-----------------+-------------+----------+--------------+ Dist forearm                            not visualized +-----------------+-------------+----------+--------------+ +-----------------+-------------+----------+--------+ Right Basilic    Diameter (cm)Depth (cm)Findings  +-----------------+-------------+----------+--------+ Prox upper arm       0.35                        +-----------------+-------------+----------+--------+ Mid upper arm        0.30                        +-----------------+-------------+----------+--------+ Dist upper arm       0.32                        +-----------------+-------------+----------+--------+ Antecubital fossa    0.36                        +-----------------+-------------+----------+--------+ Prox forearm         0.26                        +-----------------+-------------+----------+--------+ +-----------------+-------------+----------+--------------+ Left Cephalic    Diameter (cm)Depth (cm)   Findings    +-----------------+-------------+----------+--------------+ Prox upper arm                          not visualized +-----------------+-------------+----------+--------------+ Mid upper arm                           not visualized +-----------------+-------------+----------+--------------+ Dist upper arm       0.12                              +-----------------+-------------+----------+--------------+ Antecubital fossa    0.11                              +-----------------+-------------+----------+--------------+  Prox forearm         0.10                              +-----------------+-------------+----------+--------------+ Mid forearm          0.11                              +-----------------+-------------+----------+--------------+ Dist forearm         0.07                              +-----------------+-------------+----------+--------------+ +-----------------+-------------+----------+--------+ Left Basilic     Diameter (cm)Depth (cm)Findings +-----------------+-------------+----------+--------+ Prox upper arm       0.30                        +-----------------+-------------+----------+--------+ Mid upper arm        0.26                         +-----------------+-------------+----------+--------+ Dist upper arm       0.26                        +-----------------+-------------+----------+--------+ Antecubital fossa    0.24                        +-----------------+-------------+----------+--------+ Prox forearm         0.21                        +-----------------+-------------+----------+--------+ Summary: Right: Compressible veins. Small diameter cephalic vein. No        visualization at the wrist. Left: Compressible veins. Small diameter cephalic vein. No       visualization from the mid forearm distally. *See table(s) above for measurements and observations  Diagnosing physician: Cordella Shawl MD Electronically signed by Cordella Shawl MD on 02/15/2024 at 11:28:28 AM.    Final    VAS US  UPPER EXTREMITY ARTERIAL DUPLEX Result Date: 02/15/2024  UPPER EXTREMITY DUPLEX STUDY Patient Name:  TYKERIA WAWRZYNIAK  Date of Exam:   02/11/2024 Medical Rec #: [id number]         Accession #:    [id number] Date of Birth: 1971-01-24         Patient Gender: F Patient Age:   69 years Exam Location:  [facility] Procedure:      VAS US  UPPER EXTREMITY ARTERIAL DUPLEX Referring Phys: CORDELLA SHAWL --------------------------------------------------------------------------------  Indications: Pre=access. History:     Patient has a history of esrd.  Performing Technologist: Jerel Croak RVT  Examination Guidelines: A complete evaluation includes B-mode imaging, spectral Doppler, color Doppler, and power Doppler as needed of all accessible portions of each vessel. Bilateral testing is considered an integral part of a complete examination. Limited examinations for reoccurring indications may be performed as noted.  Right Pre-Dialysis Findings: +-----------------------+----------+--------------------+----------+--------+ Location               PSV (cm/s)Intralum. Diam. (cm)Waveform  Comments  +-----------------------+----------+--------------------+----------+--------+ Brachial Antecub. fossa60        0.45                triphasic          +-----------------------+----------+--------------------+----------+--------+ Radial Art at Wrist  52        0.21                biphasic           +-----------------------+----------+--------------------+----------+--------+ Ulnar Art at Wrist     21        0.14                monophasic         +-----------------------+----------+--------------------+----------+--------+  Left Pre-Dialysis Findings: +-----------------------+----------+--------------------+---------+--------+ Location               PSV (cm/s)Intralum. Diam. (cm)Waveform Comments +-----------------------+----------+--------------------+---------+--------+ Brachial Antecub. fossa70        0.41                triphasic         +-----------------------+----------+--------------------+---------+--------+ Radial Art at Wrist    58        0.19                triphasic         +-----------------------+----------+--------------------+---------+--------+ Ulnar Art at Wrist     49        0.16                triphasic         +-----------------------+----------+--------------------+---------+--------+  Summary:  Right: No obstruction visualized in the right upper extremity Mild        atherosclerosis. Small diameter distal ulnar artery. Left: No obstruction visualized in the left upper extremity Mild       atherosclerosis. Small diameter distal ulnar artery. *See table(s) above for measurements and observations. Electronically signed by Cordella Shawl MD on 02/15/2024 at 11:27:11 AM.    Final     Labs: Basic Metabolic Panel: Recent Labs  Lab 03/07/24 2024 03/08/24 1300 03/10/24 0559  NA 139 139 141  K 4.0 4.1 3.6  CL 99 101 106  CO2 25 24 25   GLUCOSE 98 81 80  BUN 83* 87* 69*  CREATININE 7.45* 8.31* 6.67*  CALCIUM  8.4* 7.5* 7.8*  PHOS  --  3.5 3.3    CBC: Recent Labs  Lab 03/07/24 2024 03/08/24 1300  WBC 5.8 5.7  NEUTROABS 3.4  --   HGB 10.2* 9.1*  HCT 31.2* 28.8*  MCV 78.4* 80.4  PLT 132* 139*   Microbiology: Results for orders placed or performed during the hospital encounter of 01/31/24  MRSA Next Gen by PCR, Nasal     Status: Abnormal   Collection Time: 01/31/24  1:13 PM   Specimen: Nasal Mucosa; Nasal Swab  Result Value Ref Range Status   MRSA by PCR Next Gen DETECTED (A) NOT DETECTED Final    Comment: CRITICAL RESULT CALLED TO, READ BACK BY AND VERIFIED WITH: KATHERINE CLAYTON 01/31/24 1517 SLM (NOTE) The GeneXpert MRSA Assay (FDA approved for NASAL specimens only), is one component of a comprehensive MRSA colonization surveillance program. It is not intended to diagnose MRSA infection nor to guide or monitor treatment for MRSA infections. Test performance is not FDA approved in patients less than 57 years old. Performed at [facility], [address], [place], [place] [address]     Time coordinating discharge: Over 30 minutes  Marien LITTIE Piety, MD  [facility] 03/10/2024, 12:55 PM

## 2024-03-10 NOTE — Progress Notes (Signed)
 [facility]  ROUNDING NOTE   Subjective:   Allison Hill is a 53 y.o. female with past medical history of ESRD on HD, hypertension, congestive heart failure, migraine headaches, history of intracranial hemorrhage, cocaine abuse, sickle cell trait who was admitted for Facial droop [R29.810] Stroke [facility]) [I63.9] Vision changes [H53.9]  Patient is known to our practice and is a resident at [facility], where she receives her dialysis.  Patient seen and evaluated during dialysis   HEMODIALYSIS FLOWSHEET:  Blood Flow Rate (mL/min): 349 mL/min Arterial Pressure (mmHg): -167.46 mmHg Venous Pressure (mmHg): 165.24 mmHg TMP (mmHg): 13.94 mmHg Ultrafiltration Rate (mL/min): 829 mL/min Dialysate Flow Rate (mL/min): 299 ml/min  States she feels well Denies headache   Objective:  Vital signs in last 24 hours:  Temp:  [98.3 F (36.8 C)-99 F (37.2 C)] 98.4 F (36.9 C) (10/09 1024) Pulse Rate:  [81-97] 81 (10/09 1200) Resp:  [16-21] 21 (10/09 1200) BP: (112-203)/(76-127) 112/76 (10/09 1200) SpO2:  [98 %-100 %] 99 % (10/09 1200) Weight:  [77.8 kg] 77.8 kg (10/09 1024)  Weight change:  Filed Weights   03/08/24 1230 03/08/24 1630 03/10/24 1024  Weight: 76.9 kg 75.9 kg 77.8 kg    Intake/Output: I/O last 3 completed shifts: In: 920 [P.O.:920] Out: -    Intake/Output this shift:  No intake/output data recorded.  Physical Exam: General: NAD  Head: Normocephalic, atraumatic. Moist oral mucosal membranes  Eyes: Anicteric  Lungs:  Clear to auscultation, room air  Heart: Regular rate and rhythm  Abdomen:  Soft, nontender  Extremities: Trace peripheral edema.  Neurologic: Awake, alert, conversant  Skin: Warm,dry, no rash  Access: Right IJ PermCath    Basic Metabolic Panel: Recent Labs  Lab 03/07/24 2024 03/08/24 1300 03/10/24 0559  NA 139 139 141  K 4.0 4.1 3.6  CL 99 101 106  CO2 25 24 25   GLUCOSE 98 81 80  BUN 83* 87* 69*  CREATININE 7.45* 8.31*  6.67*  CALCIUM  8.4* 7.5* 7.8*  PHOS  --  3.5 3.3    Liver Function Tests: Recent Labs  Lab 03/07/24 2024 03/08/24 1300 03/10/24 0559  AST 24  --   --   ALT 17  --   --   ALKPHOS 377*  --   --   BILITOT 0.7  --   --   PROT 7.5  --   --   ALBUMIN 3.8 3.4* 3.5   No results for input(s): LIPASE, AMYLASE in the last 168 hours. No results for input(s): AMMONIA in the last 168 hours.  CBC: Recent Labs  Lab 03/07/24 2024 03/08/24 1300  WBC 5.8 5.7  NEUTROABS 3.4  --   HGB 10.2* 9.1*  HCT 31.2* 28.8*  MCV 78.4* 80.4  PLT 132* 139*    Cardiac Enzymes: No results for input(s): CKTOTAL, CKMB, CKMBINDEX, TROPONINI in the last 168 hours.  BNP: Invalid input(s): POCBNP  CBG: Recent Labs  Lab 03/07/24 1905  GLUCAP 99    Microbiology: Results for orders placed or performed during the hospital encounter of 01/31/24  MRSA Next Gen by PCR, Nasal     Status: Abnormal   Collection Time: 01/31/24  1:13 PM   Specimen: Nasal Mucosa; Nasal Swab  Result Value Ref Range Status   MRSA by PCR Next Gen DETECTED (A) NOT DETECTED Final    Comment: CRITICAL RESULT CALLED TO, READ BACK BY AND VERIFIED WITH: KATHERINE CLAYTON 01/31/24 1517 SLM (NOTE) The GeneXpert MRSA Assay (FDA approved for NASAL specimens  only), is one component of a comprehensive MRSA colonization surveillance program. It is not intended to diagnose MRSA infection nor to guide or monitor treatment for MRSA infections. Test performance is not FDA approved in patients less than 33 years old. Performed at [facility], [address] [place], [place] [address]     Coagulation Studies: Recent Labs    03/07/24 03-29-23  LABPROT 13.0  INR 0.9    Urinalysis: No results for input(s): COLORURINE, LABSPEC, PHURINE, GLUCOSEU, HGBUR, BILIRUBINUR, KETONESUR, PROTEINUR, UROBILINOGEN, NITRITE, LEUKOCYTESUR in the last 72 hours.  Invalid input(s): APPERANCEUR     Imaging: ECHOCARDIOGRAM COMPLETE Result Date: 03/08/2024    ECHOCARDIOGRAM REPORT   Patient Name:   Allison Hill Date of Exam: 03/08/2024 Medical Rec #:  [id number]        Height:       63.0 in Accession #:    [id number]       Weight:       186.5 lb Date of Birth:  [birth year]        BSA:          1.877 m Patient Age:    53 years         BP:           184/110 mmHg Patient Gender: F                HR:           90 bpm. Exam Location:  [facility] Procedure: 2D Echo, Cardiac Doppler, Color Doppler and Strain Analysis (Both            Spectral and Color Flow Doppler were utilized during procedure). Indications:     Stroke I63.9  History:         Patient has prior history of Echocardiogram examinations, most                  recent 12/08/2023. Stroke.  Sonographer:     Rosina Dunk Referring Phys:  [id number] XILIN NIU Diagnosing Phys: Lonni Hanson MD  Sonographer Comments: Global longitudinal strain was attempted. IMPRESSIONS  1. Left ventricular ejection fraction, by estimation, is 60 to 65%. The left ventricle has normal function. The left ventricle has no regional wall motion abnormalities. There is severe asymmetric left ventricular hypertrophy of the inferior segment. Indeterminate diastolic filling due to E-A fusion. The average left ventricular global longitudinal strain is -13.3 %. The global longitudinal strain is abnormal.  2. Right ventricular systolic function is normal. The right ventricular size is normal. Mildly increased right ventricular wall thickness. Tricuspid regurgitation signal is inadequate for assessing PA pressure.  3. Left atrial size was moderately dilated.  4. The mitral valve is normal in structure. Mild mitral valve regurgitation.  5. The aortic valve is tricuspid. There is mild calcification of the aortic valve. There is mild thickening of the aortic valve. Aortic valve regurgitation is mild.  6. The inferior vena cava is dilated in size with <50% respiratory variability,  suggesting right atrial pressure of 15 mmHg. FINDINGS  Left Ventricle: Left ventricular ejection fraction, by estimation, is 60 to 65%. The left ventricle has normal function. The left ventricle has no regional wall motion abnormalities. The average left ventricular global longitudinal strain is -13.3 %. Strain was performed and the global longitudinal strain is abnormal. The left ventricular internal cavity size was normal in size. There is severe asymmetric left ventricular hypertrophy of the inferior segment. Indeterminate diastolic filling due to E-A  fusion. Right  Ventricle: The right ventricular size is normal. Mildly increased right ventricular wall thickness. Right ventricular systolic function is normal. Tricuspid regurgitation signal is inadequate for assessing PA pressure. Left Atrium: Left atrial size was moderately dilated. Right Atrium: Right atrial size was normal in size. Pericardium: There is no evidence of pericardial effusion. Presence of epicardial fat layer. Mitral Valve: The mitral valve is normal in structure. Mild mitral valve regurgitation. MV peak gradient, 6.2 mmHg. The mean mitral valve gradient is 3.0 mmHg. Tricuspid Valve: The tricuspid valve is normal in structure. Tricuspid valve regurgitation is mild. Aortic Valve: The aortic valve is tricuspid. There is mild calcification of the aortic valve. There is mild thickening of the aortic valve. Aortic valve regurgitation is mild. Aortic valve mean gradient measures 9.0 mmHg. Aortic valve peak gradient measures 16.3 mmHg. Aortic valve area, by VTI measures 2.31 cm. Pulmonic Valve: The pulmonic valve was normal in structure. Pulmonic valve regurgitation is trivial. Aorta: The aortic root is normal in size and structure. Pulmonary Artery: The pulmonary artery is of normal size. Venous: The inferior vena cava is dilated in size with less than 50% respiratory variability, suggesting right atrial pressure of 15 mmHg. IAS/Shunts: The interatrial  septum was not well visualized.  LEFT VENTRICLE PLAX 2D LVIDd:         5.10 cm      Diastology LVIDs:         3.40 cm      LV e' medial:  6.09 cm/s LV PW:         1.88 cm      LV e' lateral: 9.14 cm/s LV IVS:        1.20 cm LVOT diam:     2.15 cm      2D Longitudinal Strain LV SV:         82           2D Strain GLS Avg:     -13.3 % LV SV Index:   44 LVOT Area:     3.63 cm  LV Volumes (MOD) LV vol d, MOD A2C: 79.2 ml LV vol d, MOD A4C: 105.0 ml LV vol s, MOD A2C: 25.4 ml LV vol s, MOD A4C: 59.0 ml LV SV MOD A2C:     53.8 ml LV SV MOD A4C:     105.0 ml LV SV MOD BP:      52.8 ml RIGHT VENTRICLE             IVC RV Basal diam:  4.10 cm     IVC diam: 2.50 cm RV Mid diam:    3.40 cm RV S prime:     14.10 cm/s TAPSE (M-mode): 1.9 cm LEFT ATRIUM             Index        RIGHT ATRIUM           Index LA diam:        3.50 cm 1.86 cm/m   RA Area:     14.70 cm LA Vol (A2C):   91.9 ml 48.96 ml/m  RA Volume:   33.50 ml  17.85 ml/m LA Vol (A4C):   83.7 ml 44.59 ml/m LA Biplane Vol: 86.6 ml 46.14 ml/m  AORTIC VALVE                     PULMONIC VALVE AV Area (Vmax):    2.50 cm      PV Vmax:  1.47 m/s AV Area (Vmean):   2.27 cm      PV Vmean:       101.000 cm/s AV Area (VTI):     2.31 cm      PV VTI:         0.269 m AV Vmax:           202.00 cm/s   PV Peak grad:   8.6 mmHg AV Vmean:          136.000 cm/s  PV Mean grad:   5.0 mmHg AV VTI:            0.357 m       RVOT Peak grad: 3 mmHg AV Peak Grad:      16.3 mmHg AV Mean Grad:      9.0 mmHg LVOT Vmax:         139.00 cm/s LVOT Vmean:        84.900 cm/s LVOT VTI:          0.227 m LVOT/AV VTI ratio: 0.64  AORTA Ao Root diam: 3.40 cm MITRAL VALVE MV Area VTI:  3.99 cm      SHUNTS MV Peak grad: 6.2 mmHg      Systemic VTI:  0.23 m MV Mean grad: 3.0 mmHg      Systemic Diam: 2.15 cm MV Vmax:      1.25 m/s      Pulmonic VTI:  0.202 m MV Vmean:     84.2 cm/s MV A velocity: 113.00 cm/s Lonni Hanson MD Electronically signed by Lonni Hanson MD Signature Date/Time:  03/08/2024/5:45:43 PM    Final      Medications:       stroke: early stages of recovery book   Does not apply Once   amLODipine   10 mg Oral Daily   aspirin  EC  325 mg Oral Daily   atorvastatin   40 mg Oral QPM   calcitRIOL   0.75 mcg Oral Q MTWThF   Chlorhexidine  Gluconate Cloth  6 each Topical Q0600   cinacalcet   60 mg Oral Q supper   cloNIDine   0.3 mg Transdermal Weekly   clopidogrel  75 mg Oral Daily   gabapentin   200 mg Oral TID   heparin   5,000 Units Subcutaneous Q8H   hydrALAZINE   100 mg Oral Q8H   irbesartan   300 mg Oral Daily   labetalol   400 mg Oral BID   melatonin  5 mg Oral QHS   pantoprazole   40 mg Oral Daily   polyethylene glycol  17 g Oral Daily   sevelamer  carbonate  1,600 mg Oral TID WC   acetaminophen  **OR** acetaminophen  (TYLENOL ) oral liquid 160 mg/5 mL **OR** acetaminophen , diphenhydrAMINE , hydrALAZINE   Assessment/ Plan:  Allison Hill is a 53 y.o.  female with past medical history of ESRD on HD, hypertension, congestive heart failure, migraine headaches, history of intracranial hemorrhage, cocaine abuse, sickle cell trait who was admitted for Facial droop [R29.810] Stroke [facility]) [I63.9] Vision changes [H53.9]   Hypertensive urgency, 164/114 on ED arrival.  Home regimen includes amlodipine , clonidine , hydralazine , irbesartan , isosorbide , and labetalol .  Blood pressure remains elevated 174/103. Will increase Labetalol  to 400mg  twice daily, not to be held prior to dialysis.   2.  End-stage renal disease on hemodialysis.  Receiving dialysis today, UF goal 2L as tolerated. Next treatment scheduled for Saturday.   3. Anemia of chronic kidney disease Lab Results  Component Value Date   HGB 9.1 (L) 03/08/2024    Due to elevated  blood pressure, will hold ESA's at this time. Hgb stable  4. Secondary Hyperparathyroidism: with outpatient labs: PTH 1767, phosphorus 4.4, calcium  8.5 on 9/10.   Lab Results  Component Value Date   PTH 1,248 (H) 08/11/2023    CALCIUM  7.8 (L) 03/10/2024   PHOS 3.3 03/10/2024    Patient prescribed calcium  carbonate outpatient.  Will continue to monitor bone minerals.   LOS: 0 Curtiss Mahmood 10/9/202512:17 PM

## 2024-03-10 NOTE — Progress Notes (Signed)
   03/10/24 1424  Vitals  BP (!) 151/108  Pulse Rate 87  Resp 18  Oxygen Therapy  SpO2 100 %  Post Treatment  Tolerated HD Treatment Yes  Post-Hemodialysis Comments Tx has been tolerated. Vs are stable. No verbalized concerns. Stable for discharge  Hemodialysis Catheter Right Subclavian  No placement date or time found.   Orientation: Right  Access Location: Subclavian  Site Condition No complications  Blue Lumen Status Flushed;Antimicrobial dead end cap  Red Lumen Status Flushed;Dead end cap in place  Purple Lumen Status N/A  Catheter fill solution Heparin  1000 units/ml  Catheter fill volume (Arterial) 1.6 cc  Catheter fill volume (Venous) 1.6  Dressing Type Gauze/Drain sponge;Transparent  Dressing Status Antimicrobial disc/dressing in place;Clean, Dry, Intact;Clean;Dry  Post treatment catheter status Capped and Clamped

## 2024-03-10 NOTE — Progress Notes (Incomplete)
 PROGRESS NOTE Allison Hill    DOB: 1970/10/05, 53 y.o.  FMW:[id number]    Code Status: Full Code   DOA: 03/07/2024   LOS: 0  Brief hospital course  Allison Hill is a 53 y.o. female with a PMH significant for ICH, stroke with left-sided weakness, HTN, dCHF, thrombocytopenia, ESRD-HD ([facility]), vertigo, obesity, former smoker, cocaine abuse, who presents with difficulty speaking, unsteady gait. CTA- No evidence of ischemia by CT brain perfusion. MRI brain- No acute intracranial abnormality. 2. Findings consistent with chronic hypertensive angiopathy and chronic small vessel ischemic disease, with numerous chronic microhemorrhages and multifocal white matter T2 hyperintensities. 3. Multiple chronic small vessel infarcts involving the deep gray nuclei.  Neurology was consulted to evaluate given high risk of stroke and typical presentation. Recommended 3 weeks of DAPT followed by aspirin  alone and continuing atorvastatin  and blood pressure control.   03/10/24 -symptoms have resolved back to baseline. BP remains significantly elevated. Nephrology following for HD.   Assessment & Plan  Principal Problem:   Stroke [facility]) Active Problems:   ESRD on dialysis (HCC)   HTN (hypertension)   Hypertension   Chronic diastolic CHF (congestive heart failure) (HCC)   Thrombocytopenia   Cocaine use   Obesity (BMI 30-39.9)  Dysarthria with h/o Stroke [facility]): resolved-- pt has hx of stroke with chronic left-sided weakness and left facial droop. CT head negative.  CTA of head and neck with brain perfusion negative for LVO and acute brain ischemia. Brain MRI negative for acute.  - DAPT x3 weeks followed by ASA monotherapy  - continue Statin:  lipitor   ESRD on dialysis ([facility]): Missed on Monday. Underwent HD yesterday and per patient, next scheduled session will be tomorrow - continue HD per nephrology    HTN- still poorly controlled - continue clonidine  patch, amlodipine , irbesartan , labetolol,  increase dose of home hydralazine    Chronic diastolic CHF (congestive heart failure) (HCC): 2D echo on 12/08/2023 showed EF 55-60%.  No leg edema.  No SOB.  CHF seems to be compensated. -Volume management per renal by dialysis   Thrombocytopenia:: -- This is chronic issue.  Platelet 132, no active bleeding.   H/o Cocaine use - Check UDS--negative   Obesity (BMI 30-39.9): Patient has Obesity Class I, with body weight 84.6 Kg and BMI 33.04 kg/m2.  - Encourage losing weight - Exercise and healthy diet  Body mass index is 29.64 kg/m.  VTE ppx: heparin  injection 5,000 Units Start: 03/08/24 0600  Diet:     Diet   Diet renal with fluid restriction Fluid restriction: 1200 mL Fluid; Room service appropriate? Yes; Fluid consistency: Thin   Consultants: Nephrology  Neurology   Subjective 03/10/24    Pt reports no complaints today. Feels at baseline. No headache or visual changes.    Objective  Blood pressure (!) 178/112, pulse 94, temperature 97.9 F (36.6 C), resp. rate 15, height 5' 3 (1.6 m), weight 75.9 kg, SpO2 100%.  Intake/Output Summary (Last 24 hours) at 03/10/2024 0711 Last data filed at 03/10/2024 0600 Gross per 24 hour  Intake 680 ml  Output --  Net 680 ml   Filed Weights   03/07/24 1932 03/08/24 1230 03/08/24 1630  Weight: 84.6 kg 76.9 kg 75.9 kg     Physical Exam:  General: awake, alert, NAD HEENT: atraumatic, clear conjunctiva, anicteric sclera, MMM, hearing grossly normal Respiratory: normal respiratory effort. Cardiovascular: extremities well perfused, quick capillary refill, normal S1/S2, RRR, no JVD, murmurs Nervous: A&O x3. no gross focal neurologic deficits, normal speech Extremities:  moves all equally, no edema, normal tone Skin: dry, intact, normal temperature, normal color. No rashes, lesions or ulcers on exposed skin Psychiatry: normal mood, congruent affect  Labs   I have personally reviewed the following labs and imaging studies CBC     Component Value Date/Time   WBC 5.7 03/08/2024 1300   RBC 3.58 (L) 03/08/2024 1300   HGB 9.1 (L) 03/08/2024 1300   HCT 28.8 (L) 03/08/2024 1300   PLT 139 (L) 03/08/2024 1300   MCV 80.4 03/08/2024 1300   MCH 25.4 (L) 03/08/2024 1300   MCHC 31.6 03/08/2024 1300   RDW 16.8 (H) 03/08/2024 1300   LYMPHSABS 1.6 03/07/2024 2024   MONOABS 0.6 03/07/2024 2024   EOSABS 0.2 03/07/2024 2024   BASOSABS 0.0 03/07/2024 2024      Latest Ref Rng & Units 03/10/2024    5:59 AM 03/08/2024    1:00 PM 03/07/2024    8:24 PM  BMP  Glucose 70 - 99 mg/dL 80  81  98   BUN 6 - 20 mg/dL 69  87  83   Creatinine 0.44 - 1.00 mg/dL 3.32  1.68  2.54   Sodium 135 - 145 mmol/L 141  139  139   Potassium 3.5 - 5.1 mmol/L 3.6  4.1  4.0   Chloride 98 - 111 mmol/L 106  101  99   CO2 22 - 32 mmol/L 25  24  25    Calcium  8.9 - 10.3 mg/dL 7.8  7.5  8.4     ECHOCARDIOGRAM COMPLETE Result Date: 03/08/2024    ECHOCARDIOGRAM REPORT   Patient Name:   PSALMS OLARTE Date of Exam: 03/08/2024 Medical Rec #:  [id number]        Height:       63.0 in Accession #:    [id number]       Weight:       186.5 lb Date of Birth:  [birth year]        BSA:          1.877 m Patient Age:    53 years         BP:           184/110 mmHg Patient Gender: F                HR:           90 bpm. Exam Location:  [facility] Procedure: 2D Echo, Cardiac Doppler, Color Doppler and Strain Analysis (Both            Spectral and Color Flow Doppler were utilized during procedure). Indications:     Stroke I63.9  History:         Patient has prior history of Echocardiogram examinations, most                  recent 12/08/2023. Stroke.  Sonographer:     Rosina Dunk Referring Phys:  [id number] XILIN NIU Diagnosing Phys: Lonni Hanson MD  Sonographer Comments: Global longitudinal strain was attempted. IMPRESSIONS  1. Left ventricular ejection fraction, by estimation, is 60 to 65%. The left ventricle has normal function. The left ventricle has no regional wall motion  abnormalities. There is severe asymmetric left ventricular hypertrophy of the inferior segment. Indeterminate diastolic filling due to E-A fusion. The average left ventricular global longitudinal strain is -13.3 %. The global longitudinal strain is abnormal.  2. Right ventricular systolic function is normal. The right ventricular size is normal. Mildly increased right ventricular wall thickness. Tricuspid  regurgitation signal is inadequate for assessing PA pressure.  3. Left atrial size was moderately dilated.  4. The mitral valve is normal in structure. Mild mitral valve regurgitation.  5. The aortic valve is tricuspid. There is mild calcification of the aortic valve. There is mild thickening of the aortic valve. Aortic valve regurgitation is mild.  6. The inferior vena cava is dilated in size with <50% respiratory variability, suggesting right atrial pressure of 15 mmHg. FINDINGS  Left Ventricle: Left ventricular ejection fraction, by estimation, is 60 to 65%. The left ventricle has normal function. The left ventricle has no regional wall motion abnormalities. The average left ventricular global longitudinal strain is -13.3 %. Strain was performed and the global longitudinal strain is abnormal. The left ventricular internal cavity size was normal in size. There is severe asymmetric left ventricular hypertrophy of the inferior segment. Indeterminate diastolic filling due to E-A  fusion. Right Ventricle: The right ventricular size is normal. Mildly increased right ventricular wall thickness. Right ventricular systolic function is normal. Tricuspid regurgitation signal is inadequate for assessing PA pressure. Left Atrium: Left atrial size was moderately dilated. Right Atrium: Right atrial size was normal in size. Pericardium: There is no evidence of pericardial effusion. Presence of epicardial fat layer. Mitral Valve: The mitral valve is normal in structure. Mild mitral valve regurgitation. MV peak gradient, 6.2  mmHg. The mean mitral valve gradient is 3.0 mmHg. Tricuspid Valve: The tricuspid valve is normal in structure. Tricuspid valve regurgitation is mild. Aortic Valve: The aortic valve is tricuspid. There is mild calcification of the aortic valve. There is mild thickening of the aortic valve. Aortic valve regurgitation is mild. Aortic valve mean gradient measures 9.0 mmHg. Aortic valve peak gradient measures 16.3 mmHg. Aortic valve area, by VTI measures 2.31 cm. Pulmonic Valve: The pulmonic valve was normal in structure. Pulmonic valve regurgitation is trivial. Aorta: The aortic root is normal in size and structure. Pulmonary Artery: The pulmonary artery is of normal size. Venous: The inferior vena cava is dilated in size with less than 50% respiratory variability, suggesting right atrial pressure of 15 mmHg. IAS/Shunts: The interatrial septum was not well visualized.  LEFT VENTRICLE PLAX 2D LVIDd:         5.10 cm      Diastology LVIDs:         3.40 cm      LV e' medial:  6.09 cm/s LV PW:         1.88 cm      LV e' lateral: 9.14 cm/s LV IVS:        1.20 cm LVOT diam:     2.15 cm      2D Longitudinal Strain LV SV:         82           2D Strain GLS Avg:     -13.3 % LV SV Index:   44 LVOT Area:     3.63 cm  LV Volumes (MOD) LV vol d, MOD A2C: 79.2 ml LV vol d, MOD A4C: 105.0 ml LV vol s, MOD A2C: 25.4 ml LV vol s, MOD A4C: 59.0 ml LV SV MOD A2C:     53.8 ml LV SV MOD A4C:     105.0 ml LV SV MOD BP:      52.8 ml RIGHT VENTRICLE             IVC RV Basal diam:  4.10 cm     IVC diam: 2.50 cm RV  Mid diam:    3.40 cm RV S prime:     14.10 cm/s TAPSE (M-mode): 1.9 cm LEFT ATRIUM             Index        RIGHT ATRIUM           Index LA diam:        3.50 cm 1.86 cm/m   RA Area:     14.70 cm LA Vol (A2C):   91.9 ml 48.96 ml/m  RA Volume:   33.50 ml  17.85 ml/m LA Vol (A4C):   83.7 ml 44.59 ml/m LA Biplane Vol: 86.6 ml 46.14 ml/m  AORTIC VALVE                     PULMONIC VALVE AV Area (Vmax):    2.50 cm      PV Vmax:         1.47 m/s AV Area (Vmean):   2.27 cm      PV Vmean:       101.000 cm/s AV Area (VTI):     2.31 cm      PV VTI:         0.269 m AV Vmax:           202.00 cm/s   PV Peak grad:   8.6 mmHg AV Vmean:          136.000 cm/s  PV Mean grad:   5.0 mmHg AV VTI:            0.357 m       RVOT Peak grad: 3 mmHg AV Peak Grad:      16.3 mmHg AV Mean Grad:      9.0 mmHg LVOT Vmax:         139.00 cm/s LVOT Vmean:        84.900 cm/s LVOT VTI:          0.227 m LVOT/AV VTI ratio: 0.64  AORTA Ao Root diam: 3.40 cm MITRAL VALVE MV Area VTI:  3.99 cm      SHUNTS MV Peak grad: 6.2 mmHg      Systemic VTI:  0.23 m MV Mean grad: 3.0 mmHg      Systemic Diam: 2.15 cm MV Vmax:      1.25 m/s      Pulmonic VTI:  0.202 m MV Vmean:     84.2 cm/s MV A velocity: 113.00 cm/s Lonni Hanson MD Electronically signed by Lonni Hanson MD Signature Date/Time: 03/08/2024/5:45:43 PM    Final     Disposition Plan & Communication  Patient status: Observation  [facility] Anticipated discharge date: 10/9 pending BP improvement, HD  Family Communication: none at bedside    Author: Marien LITTIE Piety, DO [facility] 03/10/2024, 7:11 AM   Available by Epic secure chat 7AM-7PM. If 7PM-7AM, please contact night-coverage.  [facility] contact information found on [email or web address].

## 2024-03-10 NOTE — TOC Progression Note (Signed)
 Transition of Care [facility]) - Progression Note    Patient Details  Name: Allison Hill MRN: [id number] Date of Birth: 30-Nov-1970  [facility] CM/SW Contact  Marinda Cooks, RN Phone Number: 03/10/2024, 1:33 PM  Clinical Narrative:     This CM confirmed with [facility] at [facility] pt is a LTC resident at facility and can return when medically cleared . TOC will cont to follow dc planning / care coordination and update as applicable.  Expected Discharge Plan and Services         Expected Discharge Date: 03/10/24                    Social Drivers of Health (SDOH) Interventions SDOH Screenings   Food Insecurity: No Food Insecurity (03/08/2024)  Housing: Low Risk  (03/08/2024)  Transportation Needs: No Transportation Needs (03/08/2024)  Utilities: Not At Risk (03/08/2024)  Financial Resource Strain: Patient Declined (01/03/2023)   Received from [facility]  Social Connections: Patient Declined (03/08/2024)  Tobacco Use: Medium Risk (03/08/2024)    Readmission Risk Interventions    02/01/2024   12:28 PM  Readmission Risk Prevention Plan  Transportation Screening Complete  Medication Review (RN Care Manager) Complete  PCP or Specialist appointment within 3-5 days of discharge Complete  Palliative Care Screening Not Applicable  Skilled Nursing Facility Complete

## 2024-03-10 NOTE — Plan of Care (Signed)
  Problem: Education: Goal: Knowledge of disease or condition will improve Outcome: Progressing Goal: Knowledge of patient specific risk factors will improve (DELETE if not current risk factor) Outcome: Progressing   Problem: Coping: Goal: Will verbalize positive feelings about self Outcome: Progressing Goal: Will identify appropriate support needs Outcome: Progressing   Problem: Health Behavior/Discharge Planning: Goal: Goals will be collaboratively established with patient/family Outcome: Progressing   Problem: Self-Care: Goal: Ability to communicate needs accurately will improve Outcome: Progressing   Problem: Nutrition: Goal: Risk of aspiration will decrease Outcome: Progressing Goal: Dietary intake will improve Outcome: Progressing   Problem: Clinical Measurements: Goal: Will remain free from infection Outcome: Progressing Goal: Respiratory complications will improve Outcome: Progressing Goal: Cardiovascular complication will be avoided Outcome: Progressing   Problem: Activity: Goal: Risk for activity intolerance will decrease Outcome: Progressing

## 2024-03-11 ENCOUNTER — Ambulatory Visit: Admission: RE | Admit: 2024-03-11 | Source: Home / Self Care | Admitting: Vascular Surgery

## 2024-03-11 ENCOUNTER — Encounter: Payer: Self-pay | Admitting: Anesthesiology

## 2024-03-11 ENCOUNTER — Encounter: Admission: RE | Payer: Self-pay | Source: Home / Self Care

## 2024-03-11 SURGERY — INSERTION, GRAFT, ARTERIOVENOUS, UPPER EXTREMITY
Anesthesia: General | Laterality: Left

## 2024-03-11 MED ORDER — CHLORHEXIDINE GLUCONATE CLOTH 2 % EX PADS: 6.0000 | MEDICATED_PAD | Freq: Once | CUTANEOUS | Status: DC

## 2024-03-11 MED ORDER — VANCOMYCIN HCL IN DEXTROSE 1-5 GM/200ML-% IV SOLN: 1000.0000 mg | INTRAVENOUS | Status: DC

## 2024-04-19 ENCOUNTER — Other Ambulatory Visit: Payer: Self-pay | Admitting: Family Medicine

## 2024-04-19 DIAGNOSIS — Z1231 Encounter for screening mammogram for malignant neoplasm of breast: Secondary | ICD-10-CM

## 2024-05-19 ENCOUNTER — Telehealth (INDEPENDENT_AMBULATORY_CARE_PROVIDER_SITE_OTHER): Payer: Self-pay

## 2024-05-19 NOTE — Telephone Encounter (Signed)
 Spoke with Berwyn Ill at [facility] to schedule the patient for a left arm brachial axillary AV graft with Dr. Jama. Patient will be scheduled for a [facility] at [facility]. Pre-admit will call to schedule a pre-op  at the [facility]. Pre-surgical instructions will be mailed.

## 2024-05-25 ENCOUNTER — Encounter: Payer: Self-pay | Admitting: Emergency Medicine

## 2024-05-25 ENCOUNTER — Emergency Department: Admission: EM | Admit: 2024-05-25 | Discharge: 2024-05-26 | Disposition: A | Discharge status: Home / Self Care

## 2024-05-25 ENCOUNTER — Other Ambulatory Visit: Payer: Self-pay

## 2024-05-25 DIAGNOSIS — K047 Periapical abscess without sinus: Secondary | ICD-10-CM | POA: Insufficient documentation

## 2024-05-25 MED ORDER — OXYCODONE-ACETAMINOPHEN 5-325 MG PO TABS
1.0000 | ORAL_TABLET | Freq: Once | ORAL | Status: AC
  Administered 2024-05-25: 1 via ORAL
  Filled 2024-05-25: qty 1

## 2024-05-25 MED ORDER — CLINDAMYCIN HCL 150 MG PO CAPS: 450.0000 mg | ORAL_CAPSULE | Freq: Three times a day (TID) | ORAL | 0 refills | Status: AC

## 2024-05-25 MED ORDER — CLINDAMYCIN HCL 150 MG PO CAPS
450.0000 mg | ORAL_CAPSULE | Freq: Once | ORAL | Status: AC
  Administered 2024-05-25: 450 mg via ORAL
  Filled 2024-05-25: qty 3

## 2024-05-25 NOTE — ED Triage Notes (Signed)
 190To ER from [facility] for 2 months of dental pain. Received tylenol , ibuprofen and gabapentin  tonight. EMS reports high blood pressure 190/120s.

## 2024-05-25 NOTE — Discharge Instructions (Signed)
 Please take your antibiotic as prescribed.  You may take Tylenol /Motrin as needed for pain.  You will need to follow-up with a dentist for likely extraction of this tooth.  Return to the emergency department for new or worsening symptoms.

## 2024-05-25 NOTE — ED Provider Notes (Signed)
 "  [facility] Provider Note    Event Date/Time   First MD Initiated Contact with Patient 05/25/24 2111     (approximate)   History   Dental Pain   HPI  Allison Hill is a 53 y.o. female presenting to the emergency department complaining of dental pain for the last 2 months.  Patient reports that she took Tylenol , ibuprofen, and gabapentin  today with no improvement in her symptoms.  She has not seen a dentist.  Denies trismus, foul taste in mouth, fevers, sweats, or chills.     Physical Exam   Triage Vital Signs: ED Triage Vitals  Encounter Vitals Group     BP 05/25/24 2110 (!) 182/110     Girls Systolic BP Percentile --      Girls Diastolic BP Percentile --      Boys Systolic BP Percentile --      Boys Diastolic BP Percentile --      Pulse Rate 05/25/24 2110 80     Resp 05/25/24 2110 16     Temp 05/25/24 2110 98.3 F (36.8 C)     Temp Source 05/25/24 2110 Oral     SpO2 05/25/24 2110 100 %     Weight 05/25/24 2111 183 lb 13.8 oz (83.4 kg)     Height 05/25/24 2111 5' 3 (1.6 m)     Head Circumference --      Peak Flow --      Pain Score 05/25/24 2109 10     Pain Loc --      Pain Education --      Exclude from Growth Chart --     Most recent vital signs: Vitals:   05/25/24 2110  BP: (!) 182/110  Pulse: 80  Resp: 16  Temp: 98.3 F (36.8 C)  SpO2: 100%     General: Awake, no distress.  CV:  Good peripheral perfusion.  Resp:  Normal effort.  Abd:  No distention.  Other:  Poor dentition with multiple dental caries, erosion of the back right upper molar   ED Results / Procedures / Treatments   Labs (all labs ordered are listed, but only abnormal results are displayed) Labs Reviewed - No data to display   EKG     RADIOLOGY     PROCEDURES:  Critical Care performed: No  Procedures   MEDICATIONS ORDERED IN ED: Medications - No data to display   IMPRESSION / MDM / ASSESSMENT AND PLAN / ED COURSE  I reviewed the  triage vital signs and the nursing notes.                              Differential diagnosis includes, but is not limited to, dental abscess, tooth infection, dental caries, gingivitis  Patient's presentation is most consistent with acute, uncomplicated illness.  Patient is a 53 year old female presenting to the emergency department complaining of 2 months of dental pain.  On exam the patient does appear to have erosion of the upper back right molar.  I discussed with the patient that I would treat her with an antibiotic for suspected infection but that she would need to follow-up with a dentist for definitive intervention.  The patient voiced her understanding.  She is requesting something for pain here in the emergency department and was provided with a dose of Percocet as well as her dose of clindamycin .  She will be given a prescription for clindamycin .  Follow-up with dentist as previously discussed.  Return to the emergency department for new or worsening symptoms.      FINAL CLINICAL IMPRESSION(S) / ED DIAGNOSES   Final diagnoses:  Dental infection     Rx / DC Orders   ED Discharge Orders          Ordered    clindamycin  (CLEOCIN ) 150 MG capsule  3 times daily        05/25/24 2132             Note:  This document was prepared using Dragon voice recognition software and may include unintentional dictation errors.   Rexford Reche HERO, MD 05/25/24 [id number]  "

## 2024-06-09 ENCOUNTER — Ambulatory Visit
Admission: RE | Admit: 2024-06-09 | Discharge: 2024-06-09 | Disposition: A | Discharge status: Home / Self Care | Source: Ambulatory Visit | Attending: Family Medicine | Admitting: Family Medicine

## 2024-06-09 DIAGNOSIS — Z1231 Encounter for screening mammogram for malignant neoplasm of breast: Secondary | ICD-10-CM | POA: Insufficient documentation

## 2024-06-10 ENCOUNTER — Inpatient Hospital Stay: Admission: RE | Admit: 2024-06-10 | Discharge: 2024-06-10 | Disposition: A | Source: Ambulatory Visit

## 2024-06-10 ENCOUNTER — Other Ambulatory Visit (INDEPENDENT_AMBULATORY_CARE_PROVIDER_SITE_OTHER): Payer: Self-pay | Admitting: Nurse Practitioner

## 2024-06-10 DIAGNOSIS — N186 End stage renal disease: Secondary | ICD-10-CM

## 2024-06-10 NOTE — Patient Instructions (Signed)
 Your procedure is scheduled on: Jan 16/ 2025  Report to the Registration Desk on the 1st floor of the [facility] To find out your arrival time, please call [phone] between 1PM - 3PM on: Jan 15/2025  If your arrival time is 6:00 am, do not arrive before that time as the [facility] entrance doors do not open until 6:00 am.  REMEMBER: Instructions that are not followed completely may result in serious medical risk, up to and including death; or upon the discretion of your surgeon and anesthesiologist your surgery may need to be rescheduled.  Do not eat food after midnight the night before surgery.  No gum chewing or hard candies.  You may however, drink CLEAR liquids up to 2 hours before you are scheduled to arrive for your surgery. Do not drink anything within 2 hours of your scheduled arrival time.  Clear liquids include: - water  - apple juice without pulp - gatorade (not RED colors) - black coffee or tea (Do NOT add milk or creamers to the coffee or tea) Do NOT drink anything that is not on this list.    In addition, your doctor has ordered for you to drink the provided:  Ensure Pre-Surgery Clear Carbohydrate Drink  Gatorade G2 Drinking this carbohydrate drink up to two hours before surgery helps to reduce insulin resistance and improve patient outcomes. Please complete drinking 2 hours before scheduled arrival time.  One week prior to surgery: Stop Anti-inflammatories (NSAIDS) such as Advil, Aleve, Ibuprofen, Motrin, Naproxen, Naprosyn and Aspirin  based products such as Excedrin, Goody's Powder, BC Powder. Stop ANY OVER THE COUNTER supplements until after surgery.  You may however, continue to take Tylenol  if needed for pain up until the day of surgery.  **Follow guidelines for insulin and diabetes medications.**  **Follow recommendations regarding stopping blood thinners.**  Continue taking all of your other prescription medications up until the day of  surgery.  ON THE DAY OF SURGERY ONLY TAKE THESE MEDICATIONS WITH SIPS OF WATER:    Use inhalers on the day of surgery and bring to the hospital.  Fleets enema or bowel prep as directed.  No Alcohol for 24 hours before or after surgery.  No Smoking including e-cigarettes for 24 hours before surgery.  No chewable tobacco products for at least 6 hours before surgery.  No nicotine patches on the day of surgery.  Do not use any recreational drugs for at least a week (preferably 2 weeks) before your surgery.  Please be advised that the combination of cocaine and anesthesia may have negative outcomes, up to and including death. If you test positive for cocaine, your surgery will be cancelled.  On the morning of surgery brush your teeth with toothpaste and water, you may rinse your mouth with mouthwash if you wish. Do not swallow any toothpaste or mouthwash.  Use CHG Soap or wipes as directed on instruction sheet.  Do not wear jewelry, make-up, hairpins, clips or nail polish.  For welded (permanent) jewelry: bracelets, anklets, waist bands, etc.  Please have this removed prior to surgery.  If it is not removed, there is a chance that hospital personnel will need to cut it off on the day of surgery.  Do not wear lotions, powders, or perfumes.   Do not shave body hair from the neck down 48 hours before surgery.  Contact lenses, hearing aids and dentures may not be worn into surgery.  Do not bring valuables to the hospital. [facility] is not  responsible for any missing/lost belongings or valuables.   Total Shoulder Arthroplasty:  use Benzoyl Peroxide 5% Gel as directed on instruction sheet.  Bring your C-PAP to the hospital in case you may have to spend the night.   Notify your doctor if there is any change in your medical condition (cold, fever, infection).  Wear comfortable clothing (specific to your surgery type) to the hospital.  After surgery, you can help prevent lung  complications by doing breathing exercises.  Take deep breaths and cough every 1-2 hours. Your doctor may order a device called an Incentive Spirometer to help you take deep breaths. When coughing or sneezing, hold a pillow firmly against your incision with both hands. This is called splinting. Doing this helps protect your incision. It also decreases belly discomfort.  If you are being admitted to the hospital overnight, leave your suitcase in the car. After surgery it may be brought to your room.  In case of increased patient census, it may be necessary for you, the patient, to continue your postoperative care in the Same Day Surgery department.  If you are being discharged the day of surgery, you will not be allowed to drive home. You will need a responsible individual to drive you home and stay with you for 24 hours after surgery.   If you are taking public transportation, you will need to have a responsible individual with you.  Please call the Pre-admissions Testing Dept. at [phone] if you have any questions about these instructions.  Surgery Visitation Policy:  Patients having surgery or a procedure may have two visitors.  Children under the age of 64 must have an adult with them who is not the patient.  Inpatient Visitation:    Visiting hours are 7 a.m. to 8 p.m. Up to four visitors are allowed at one time in a patient room. The visitors may rotate out with other people during the day.  One visitor age 16 or older may stay with the patient overnight and must be in the room by 8 p.m.   Merchandiser, Retail to address health-related social needs:  [email or web address]                                                                                                               Preparing for Surgery with CHLORHEXIDINE  GLUCONATE (CHG) Soap  Chlorhexidine  Gluconate (CHG) Soap  o An antiseptic cleaner that kills germs and bonds with the skin to  continue killing germs even after washing  o Used for showering the night before surgery and morning of surgery  Before surgery, you can play an important role by reducing the number of germs on your skin.  CHG (Chlorhexidine  gluconate) soap is an antiseptic cleanser which kills germs and bonds with the skin to continue killing germs even after washing.  Please do not use if you have an allergy to CHG or antibacterial soaps. If your skin becomes reddened/irritated stop using the CHG.  1. Shower the NIGHT BEFORE SURGERY with CHG soap.  2.  If you choose to wash your hair, wash your hair first as usual with your normal shampoo.  3. After shampooing, rinse your hair and body thoroughly to remove the shampoo.  4. Use CHG as you would any other liquid soap. You can apply CHG directly to the skin and wash gently with a clean washcloth.  5. Apply the CHG soap to your body only from the neck down. Do not use on open wounds or open sores. Avoid contact with your eyes, ears, mouth, and genitals (private parts). Wash face and genitals (private parts) with your normal soap.  6. Wash thoroughly, paying special attention to the area where your surgery will be performed.  7. Thoroughly rinse your body with warm water.  8. Do not shower/wash with your normal soap after using and rinsing off the CHG soap.  9. Do not use lotions, oils, etc., after showering with CHG.  10. Pat yourself dry with a clean towel.  11. Wear clean pajamas to bed the night before surgery.  12. Place clean sheets on your bed the night of your shower and do not sleep with pets.  13. Do not apply any deodorants/lotions/powders.  14. Please wear clean clothes to the hospital.  15. Remember to brush your teeth with your regular toothpaste.

## 2024-06-14 ENCOUNTER — Inpatient Hospital Stay
Admission: RE | Admit: 2024-06-14 | Discharge: 2024-06-14 | Disposition: A | Source: Ambulatory Visit | Attending: Vascular Surgery | Admitting: Vascular Surgery

## 2024-06-14 ENCOUNTER — Other Ambulatory Visit: Payer: Self-pay

## 2024-06-14 ENCOUNTER — Inpatient Hospital Stay: Admission: RE | Admit: 2024-06-14

## 2024-06-14 VITALS — Wt 183.9 lb

## 2024-06-14 DIAGNOSIS — Z992 Dependence on renal dialysis: Secondary | ICD-10-CM | POA: Insufficient documentation

## 2024-06-14 DIAGNOSIS — N186 End stage renal disease: Secondary | ICD-10-CM | POA: Diagnosis not present

## 2024-06-14 DIAGNOSIS — Z01812 Encounter for preprocedural laboratory examination: Secondary | ICD-10-CM | POA: Insufficient documentation

## 2024-06-14 DIAGNOSIS — Z01818 Encounter for other preprocedural examination: Secondary | ICD-10-CM | POA: Diagnosis present

## 2024-06-14 NOTE — Patient Instructions (Addendum)
 Your procedure is scheduled on:1-53-26 Friday Report to the Registration Desk on the 1st floor of the [facility].Then proceed to the 2nd floor Surgery Desk To find out your arrival time, please call [phone] between 1PM - 3PM on:1-53-26 Thursday If your arrival time is 6:00 am, do not arrive before that time as the [facility] entrance doors do not open until 6:00 am.  REMEMBER: Instructions that are not followed completely may result in serious medical risk, up to and including death; or upon the discretion of your surgeon and anesthesiologist your surgery may need to be rescheduled.  Do not eat food OR drink liquids after midnight the night before surgery.  No gum chewing or hard candies.  One week prior to surgery:Stop NOW ([birth year]) Stop Anti-inflammatories (NSAIDS) such as Advil, Aleve, Ibuprofen, Motrin, Naproxen, Naprosyn and Aspirin  based products such as Excedrin, Goody's Powder, BC Powder.  CHANGE aspirin  EC 53 MG to Aspirin  81mg  starting today (06/14/24).  If order is needed contact Dr. Kathryn office or call Pre-admit testing [phone].   Stop ANY OVER THE COUNTER supplements until after surgery.  You may however, continue to take Tylenol  if needed for pain up until the day of surgery.  Continue taking all of your other prescription medications up until the day of surgery.  ON THE DAY OF SURGERY ONLY TAKE THESE MEDICATIONS WITH SIPS OF WATER:  amLODipine  (NORVASC )  gabapentin  (NEURONTIN ) hydrALAZINE  (APRESOLINE )   labetalol  (NORMODYNE )  pantoprazole  (PROTONIX )   Use inhalers on the day of surgery and bring to the hospital.  No Alcohol for 24 hours before or after surgery.  No Smoking including e-cigarettes for 24 hours before surgery.  No chewable tobacco products for at least 6 hours before surgery.  No nicotine patches on the day of surgery.  Do not use any recreational drugs for at least a week (preferably 2 weeks) before your surgery.  Please be  advised that the combination of cocaine and anesthesia may have negative outcomes, up to and including death. If you test positive for cocaine, your surgery will be cancelled.  On the morning of surgery brush your teeth with toothpaste and water, you may rinse your mouth with mouthwash if you wish. Do not swallow any toothpaste or mouthwash.  Use CHG Soap or wipes as directed on instruction sheet.  Do not wear jewelry, make-up, hairpins, clips or nail polish.  For welded (permanent) jewelry: bracelets, anklets, waist bands, etc.  Please have this removed prior to surgery.  If it is not removed, there is a chance that hospital personnel will need to cut it off on the day of surgery.  Do not wear lotions, powders, or perfumes.   Do not shave body hair from the neck down 48 hours before surgery.  Contact lenses, hearing aids and dentures may not be worn into surgery.  Do not bring valuables to the hospital. [facility] is not responsible for any missing/lost belongings or valuables.   Notify your doctor if there is any change in your medical condition (cold, fever, infection).  Wear comfortable clothing (specific to your surgery type) to the hospital.  After surgery, you can help prevent lung complications by doing breathing exercises.  Take deep breaths and cough every 1-2 hours. Your doctor may order a device called an Incentive Spirometer to help you take deep breaths. When coughing or sneezing, hold a pillow firmly against your incision with both hands. This is called splinting. Doing this helps protect your incision. It also decreases  belly discomfort.  If you are being admitted to the hospital overnight, leave your suitcase in the car. After surgery it may be brought to your room.  In case of increased patient census, it may be necessary for you, the patient, to continue your postoperative care in the Same Day Surgery department.  If you are being discharged the day of surgery,  you will not be allowed to drive home. You will need a responsible individual to drive you home and stay with you for 24 hours after surgery.   If you are taking public transportation, you will need to have a responsible individual with you.  Please call the Pre-admissions Testing Dept. at [phone] if you have any questions about these instructions.  Surgery Visitation Policy:  Patients having surgery or a procedure may have two visitors.  Children under the age of 53 must have an adult with them who is not the patient.  Inpatient Visitation:    Visiting hours are 7 a.m. to 8 p.m. Up to four visitors are allowed at one time in a patient room. The visitors may rotate out with other people during the day.  One visitor age 67 or older may stay with the patient overnight and must be in the room by 8 p.m.  Merchandiser, Retail to address health-related social needs:  [email or web address]                                                                                                             Preparing for Surgery with CHLORHEXIDINE  GLUCONATE (CHG) Soap  Chlorhexidine  Gluconate (CHG) Soap  o An antiseptic cleaner that kills germs and bonds with the skin to continue killing germs even after washing  o Used for showering the night before surgery and morning of surgery  Before surgery, you can play an important role by reducing the number of germs on your skin.  CHG (Chlorhexidine  gluconate) soap is an antiseptic cleanser which kills germs and bonds with the skin to continue killing germs even after washing.  Please do not use if you have an allergy to CHG or antibacterial soaps. If your skin becomes reddened/irritated stop using the CHG.  1. Shower the NIGHT BEFORE SURGERY with CHG soap.  2. If you choose to wash your hair, wash your hair first as usual with your normal shampoo.  3. After shampooing, rinse your hair and body thoroughly to remove the  shampoo.  4. Use CHG as you would any other liquid soap. You can apply CHG directly to the skin and wash gently with a clean washcloth.  5. Apply the CHG soap to your body only from the neck down. Do not use on open wounds or open sores. Avoid contact with your eyes, ears, mouth, and genitals (private parts). Wash face and genitals (private parts) with your normal soap.  6. Wash thoroughly, paying special attention to the area where your surgery will be performed.  7. Thoroughly rinse your body with warm water.  8. Do not shower/wash with your normal soap after  using and rinsing off the CHG soap.  9. Do not use lotions, oils, etc., after showering with CHG.  10. Pat yourself dry with a clean towel.  11. Wear clean pajamas to bed the night before surgery.  12. Place clean sheets on your bed the night of your shower and do not sleep with pets.  13. Do not apply any deodorants/lotions/powders.  14. Please wear clean clothes to the hospital.  15. Remember to brush your teeth with your regular toothpaste.

## 2024-06-15 ENCOUNTER — Emergency Department

## 2024-06-15 ENCOUNTER — Encounter: Payer: Self-pay | Admitting: *Deleted

## 2024-06-15 ENCOUNTER — Other Ambulatory Visit: Payer: Self-pay

## 2024-06-15 ENCOUNTER — Inpatient Hospital Stay
Admission: EM | Admit: 2024-06-15 | Discharge: 2024-06-20 | DRG: 304 | Disposition: A | Discharge status: Skilled Nursing Facility | Source: Skilled Nursing Facility | Attending: Internal Medicine | Admitting: Internal Medicine

## 2024-06-15 DIAGNOSIS — I120 Hypertensive chronic kidney disease with stage 5 chronic kidney disease or end stage renal disease: Secondary | ICD-10-CM

## 2024-06-15 DIAGNOSIS — I10 Essential (primary) hypertension: Secondary | ICD-10-CM | POA: Diagnosis not present

## 2024-06-15 DIAGNOSIS — Z79899 Other long term (current) drug therapy: Secondary | ICD-10-CM

## 2024-06-15 DIAGNOSIS — Z881 Allergy status to other antibiotic agents status: Secondary | ICD-10-CM

## 2024-06-15 DIAGNOSIS — D696 Thrombocytopenia, unspecified: Secondary | ICD-10-CM | POA: Diagnosis present

## 2024-06-15 DIAGNOSIS — E785 Hyperlipidemia, unspecified: Secondary | ICD-10-CM | POA: Diagnosis present

## 2024-06-15 DIAGNOSIS — R7401 Elevation of levels of liver transaminase levels: Secondary | ICD-10-CM | POA: Diagnosis not present

## 2024-06-15 DIAGNOSIS — R270 Ataxia, unspecified: Secondary | ICD-10-CM | POA: Diagnosis present

## 2024-06-15 DIAGNOSIS — Z792 Long term (current) use of antibiotics: Secondary | ICD-10-CM

## 2024-06-15 DIAGNOSIS — I132 Hypertensive heart and chronic kidney disease with heart failure and with stage 5 chronic kidney disease, or end stage renal disease: Secondary | ICD-10-CM | POA: Diagnosis present

## 2024-06-15 DIAGNOSIS — R531 Weakness: Secondary | ICD-10-CM | POA: Diagnosis not present

## 2024-06-15 DIAGNOSIS — Z91013 Allergy to seafood: Secondary | ICD-10-CM

## 2024-06-15 DIAGNOSIS — H538 Other visual disturbances: Secondary | ICD-10-CM | POA: Diagnosis present

## 2024-06-15 DIAGNOSIS — N186 End stage renal disease: Secondary | ICD-10-CM | POA: Diagnosis present

## 2024-06-15 DIAGNOSIS — E1122 Type 2 diabetes mellitus with diabetic chronic kidney disease: Secondary | ICD-10-CM | POA: Diagnosis present

## 2024-06-15 DIAGNOSIS — Z8249 Family history of ischemic heart disease and other diseases of the circulatory system: Secondary | ICD-10-CM

## 2024-06-15 DIAGNOSIS — D631 Anemia in chronic kidney disease: Secondary | ICD-10-CM | POA: Diagnosis present

## 2024-06-15 DIAGNOSIS — R29810 Facial weakness: Secondary | ICD-10-CM | POA: Diagnosis not present

## 2024-06-15 DIAGNOSIS — N179 Acute kidney failure, unspecified: Secondary | ICD-10-CM | POA: Diagnosis present

## 2024-06-15 DIAGNOSIS — Z992 Dependence on renal dialysis: Secondary | ICD-10-CM | POA: Diagnosis not present

## 2024-06-15 DIAGNOSIS — Z8673 Personal history of transient ischemic attack (TIA), and cerebral infarction without residual deficits: Secondary | ICD-10-CM

## 2024-06-15 DIAGNOSIS — I5032 Chronic diastolic (congestive) heart failure: Secondary | ICD-10-CM | POA: Diagnosis present

## 2024-06-15 DIAGNOSIS — R29709 NIHSS score 9: Secondary | ICD-10-CM | POA: Diagnosis present

## 2024-06-15 DIAGNOSIS — R519 Headache, unspecified: Principal | ICD-10-CM

## 2024-06-15 DIAGNOSIS — I161 Hypertensive emergency: Principal | ICD-10-CM | POA: Diagnosis present

## 2024-06-15 DIAGNOSIS — E213 Hyperparathyroidism, unspecified: Secondary | ICD-10-CM | POA: Diagnosis present

## 2024-06-15 DIAGNOSIS — Z888 Allergy status to other drugs, medicaments and biological substances status: Secondary | ICD-10-CM

## 2024-06-15 DIAGNOSIS — I639 Cerebral infarction, unspecified: Secondary | ICD-10-CM | POA: Diagnosis present

## 2024-06-15 DIAGNOSIS — Z87891 Personal history of nicotine dependence: Secondary | ICD-10-CM

## 2024-06-15 DIAGNOSIS — E876 Hypokalemia: Secondary | ICD-10-CM | POA: Diagnosis present

## 2024-06-15 DIAGNOSIS — Z7982 Long term (current) use of aspirin: Secondary | ICD-10-CM

## 2024-06-15 DIAGNOSIS — R4781 Slurred speech: Secondary | ICD-10-CM | POA: Diagnosis present

## 2024-06-15 DIAGNOSIS — D573 Sickle-cell trait: Secondary | ICD-10-CM | POA: Diagnosis present

## 2024-06-15 LAB — CBC
HCT: 38.3 % (ref 36.0–46.0)
Hemoglobin: 12.2 g/dL (ref 12.0–15.0)
MCH: 26 pg (ref 26.0–34.0)
MCHC: 31.9 g/dL (ref 30.0–36.0)
MCV: 81.5 fL (ref 80.0–100.0)
Platelets: 179 K/uL (ref 150–400)
RBC: 4.7 MIL/uL (ref 3.87–5.11)
RDW: 18.3 % — ABNORMAL HIGH (ref 11.5–15.5)
WBC: 8.1 K/uL (ref 4.0–10.5)
nRBC: 0 % (ref 0.0–0.2)

## 2024-06-15 LAB — DIFFERENTIAL
Abs Immature Granulocytes: 0.03 K/uL (ref 0.00–0.07)
Basophils Absolute: 0 K/uL (ref 0.0–0.1)
Basophils Relative: 0 %
Eosinophils Absolute: 0.2 K/uL (ref 0.0–0.5)
Eosinophils Relative: 2 %
Immature Granulocytes: 0 %
Lymphocytes Relative: 18 %
Lymphs Abs: 1.4 K/uL (ref 0.7–4.0)
Monocytes Absolute: 0.7 K/uL (ref 0.1–1.0)
Monocytes Relative: 9 %
Neutro Abs: 5.7 K/uL (ref 1.7–7.7)
Neutrophils Relative %: 71 %

## 2024-06-15 LAB — ETHANOL: Alcohol, Ethyl (B): <15 mg/dL

## 2024-06-15 LAB — COMPREHENSIVE METABOLIC PANEL WITH GFR
ALT: 6 U/L (ref 0–44)
AST: 22 U/L (ref 15–41)
Albumin: 4.8 g/dL (ref 3.5–5.0)
Alkaline Phosphatase: 384 U/L — ABNORMAL HIGH (ref 38–126)
Anion gap: 15 (ref 5–15)
BUN: 37 mg/dL — ABNORMAL HIGH (ref 6–20)
CO2: 27 mmol/L (ref 22–32)
Calcium: 8.5 mg/dL — ABNORMAL LOW (ref 8.9–10.3)
Chloride: 99 mmol/L (ref 98–111)
Creatinine, Ser: 4.86 mg/dL — ABNORMAL HIGH (ref 0.44–1.00)
GFR, Estimated: 10 mL/min — ABNORMAL LOW
Glucose, Bld: 100 mg/dL — ABNORMAL HIGH (ref 70–99)
Potassium: 3.4 mmol/L — ABNORMAL LOW (ref 3.5–5.1)
Sodium: 141 mmol/L (ref 135–145)
Total Bilirubin: 0.4 mg/dL (ref 0.0–1.2)
Total Protein: 8.2 g/dL — ABNORMAL HIGH (ref 6.5–8.1)

## 2024-06-15 LAB — PROTIME-INR
INR: 0.9 (ref 0.8–1.2)
Prothrombin Time: 12.7 s (ref 11.4–15.2)

## 2024-06-15 LAB — APTT: aPTT: 43 s — ABNORMAL HIGH (ref 24–36)

## 2024-06-15 LAB — CBG MONITORING, ED: Glucose-Capillary: 99 mg/dL (ref 70–99)

## 2024-06-15 MED ORDER — ACETAMINOPHEN 160 MG/5ML PO SOLN: 650.0000 mg | ORAL | Status: DC | PRN

## 2024-06-15 MED ORDER — STROKE: EARLY STAGES OF RECOVERY BOOK: Freq: Once | Status: DC

## 2024-06-15 MED ORDER — TENECTEPLASE 25 MG IV KIT
PACK | INTRAVENOUS | Status: AC
  Administered 2024-06-15: 20 mg via INTRAVENOUS
  Filled 2024-06-15: qty 5

## 2024-06-15 MED ORDER — LABETALOL HCL 5 MG/ML IV SOLN
10.0000 mg | Freq: Once | INTRAVENOUS | Status: AC
  Administered 2024-06-15: 10 mg via INTRAVENOUS

## 2024-06-15 MED ORDER — TENECTEPLASE 25 MG IV KIT: 0.2500 mg/kg | PACK | Freq: Once | INTRAVENOUS | Status: AC

## 2024-06-15 MED ORDER — LABETALOL HCL 5 MG/ML IV SOLN
20.0000 mg | Freq: Once | INTRAVENOUS | Status: AC
  Administered 2024-06-15: 20 mg via INTRAVENOUS

## 2024-06-15 MED ORDER — POLYETHYLENE GLYCOL 3350 17 G PO PACK
17.0000 g | PACK | Freq: Every day | ORAL | Status: DC | PRN
  Administered 2024-06-18: 17 g via ORAL
  Filled 2024-06-15: qty 1

## 2024-06-15 MED ORDER — CHLORHEXIDINE GLUCONATE CLOTH 2 % EX PADS
6.0000 | MEDICATED_PAD | Freq: Every day | CUTANEOUS | Status: DC
  Filled 2024-06-15: qty 6

## 2024-06-15 MED ORDER — POTASSIUM CHLORIDE 10 MEQ/100ML IV SOLN
10.0000 meq | INTRAVENOUS | Status: AC
  Administered 2024-06-15 – 2024-06-16 (×2): 10 meq via INTRAVENOUS
  Filled 2024-06-15 (×2): qty 100

## 2024-06-15 MED ORDER — PANTOPRAZOLE SODIUM 40 MG IV SOLR
40.0000 mg | Freq: Every day | INTRAVENOUS | Status: DC
  Administered 2024-06-15: 40 mg via INTRAVENOUS
  Filled 2024-06-15: qty 10

## 2024-06-15 MED ORDER — HYDROMORPHONE HCL 1 MG/ML IJ SOLN
0.2500 mg | Freq: Once | INTRAMUSCULAR | Status: AC
  Administered 2024-06-15: 0.25 mg via INTRAVENOUS
  Filled 2024-06-15: qty 0.5

## 2024-06-15 MED ORDER — IOHEXOL 350 MG/ML SOLN
75.0000 mL | Freq: Once | INTRAVENOUS | Status: AC | PRN
  Administered 2024-06-15: 75 mL via INTRAVENOUS

## 2024-06-15 MED ORDER — SODIUM CHLORIDE 0.9 % IV SOLN: INTRAVENOUS | Status: DC

## 2024-06-15 MED ORDER — LABETALOL HCL 5 MG/ML IV SOLN
INTRAVENOUS | Status: AC
  Filled 2024-06-15: qty 4

## 2024-06-15 MED ORDER — ACETAMINOPHEN 325 MG PO TABS
650.0000 mg | ORAL_TABLET | ORAL | Status: DC | PRN
  Administered 2024-06-17 – 2024-06-20 (×6): 650 mg via ORAL
  Filled 2024-06-15 (×6): qty 2

## 2024-06-15 MED ORDER — CLEVIDIPINE BUTYRATE 0.5 MG/ML IV EMUL
0.0000 mg/h | INTRAVENOUS | Status: DC
  Administered 2024-06-15: 2 mg/h via INTRAVENOUS
  Administered 2024-06-15: 4 mg/h via INTRAVENOUS
  Administered 2024-06-16: 8 mg/h via INTRAVENOUS
  Administered 2024-06-16: 4 mg/h via INTRAVENOUS
  Filled 2024-06-15 (×4): qty 50

## 2024-06-15 MED ORDER — FENTANYL CITRATE (PF) 50 MCG/ML IJ SOSY
50.0000 ug | PREFILLED_SYRINGE | Freq: Once | INTRAMUSCULAR | Status: AC
  Administered 2024-06-15: 50 ug via INTRAVENOUS
  Filled 2024-06-15: qty 1

## 2024-06-15 MED ORDER — SENNA 8.6 MG PO TABS: 1.0000 | ORAL_TABLET | Freq: Two times a day (BID) | ORAL | Status: DC | PRN

## 2024-06-15 MED ORDER — ACETAMINOPHEN 650 MG RE SUPP: 650.0000 mg | RECTAL | Status: DC | PRN

## 2024-06-15 MED ORDER — OXYCODONE-ACETAMINOPHEN 5-325 MG PO TABS: 1.0000 | ORAL_TABLET | Freq: Once | ORAL | Status: DC

## 2024-06-15 MED ORDER — SENNOSIDES-DOCUSATE SODIUM 8.6-50 MG PO TABS: 1.0000 | ORAL_TABLET | Freq: Every evening | ORAL | Status: DC | PRN

## 2024-06-15 NOTE — ED Provider Notes (Signed)
 "  [facility] Provider Note    Event Date/Time   First MD Initiated Contact with Patient 06/15/24 1944     (approximate)   History   Headache   HPI  Allison Hill is a 54 y.o. female who presented to the emergency department today from living facility because of concerns for acute onset headache, slurred speech and blurry vision that happened around 6:00 this evening.  Patient has history of end-stage renal disease and is on dialysis.  Did have her normal course of dialysis earlier in the day today.  At the time my exam her primary complaint is for the continued and severe headache.     Physical Exam   Triage Vital Signs: ED Triage Vitals  Encounter Vitals Group     BP 06/15/24 1916 (!) 179/115     Girls Systolic BP Percentile --      Girls Diastolic BP Percentile --      Boys Systolic BP Percentile --      Boys Diastolic BP Percentile --      Pulse Rate 06/15/24 1916 94     Resp 06/15/24 1916 18     Temp 06/15/24 1916 98.9 F (37.2 C)     Temp Source 06/15/24 1916 Oral     SpO2 06/15/24 1916 99 %     Weight 06/15/24 1916 180 lb (81.6 kg)     Height 06/15/24 1916 5' 3 (1.6 m)     Head Circumference --      Peak Flow --      Pain Score 06/15/24 1922 10     Pain Loc --      Pain Education --      Exclude from Growth Chart --     Most recent vital signs: Vitals:   06/15/24 2022 06/15/24 2031  BP: 135/88 (!) 145/89  Pulse: (!) 104 94  Resp:  18  Temp:    SpO2:  98%   General: Awake, alert, oriented. CV:  Good peripheral perfusion. Regular rate and rhythm. Resp:  Normal effort. Lungs clear. Abd:  No distention.   ED Results / Procedures / Treatments   Labs (all labs ordered are listed, but only abnormal results are displayed) Labs Reviewed  APTT - Abnormal; Notable for the following components:      Result Value   aPTT 43 (*)    All other components within normal limits  CBC - Abnormal; Notable for the following components:    RDW 18.3 (*)    All other components within normal limits  COMPREHENSIVE METABOLIC PANEL WITH GFR - Abnormal; Notable for the following components:   Potassium 3.4 (*)    Glucose, Bld 100 (*)    BUN 37 (*)    Creatinine, Ser 4.86 (*)    Calcium  8.5 (*)    Total Protein 8.2 (*)    Alkaline Phosphatase 384 (*)    GFR, Estimated 10 (*)    All other components within normal limits  PROTIME-INR  DIFFERENTIAL  ETHANOL  CBG MONITORING, ED  CBG MONITORING, ED     EKG  I, Guadalupe Eagles, attending physician, personally viewed and interpreted this EKG  EKG Time: 2147 Rate: 87 Rhythm: normal sinus rhythm Axis: normal Intervals: qtc 548 QRS: LVH ST changes: no st elevation Impression: abnormal ekg  RADIOLOGY I independently interpreted and visualized the CT head. My interpretation: No ICH Radiology interpretation:  IMPRESSION:  1. No acute intracranial abnormality.  2. ASPECTS is 10.  3.  Age-related cerebral atrophy with moderate chronic microvascular  ischemic disease, with multiple remote lacunar infarcts about the  deep gray nuclei.      PROCEDURES:  Critical Care performed: Yes  CRITICAL CARE Performed by: Guadalupe Eagles   Total critical care time: 20 minutes  Critical care time was exclusive of separately billable procedures and treating other patients.  Critical care was necessary to treat or prevent imminent or life-threatening deterioration.  Critical care was time spent personally by me on the following activities: development of treatment plan with patient and/or surrogate as well as nursing, discussions with consultants, evaluation of patient's response to treatment, examination of patient, obtaining history from patient or surrogate, ordering and performing treatments and interventions, ordering and review of laboratory studies, ordering and review of radiographic studies, pulse oximetry and re-evaluation of patient's condition.    Procedures    MEDICATIONS ORDERED IN ED: Medications  labetalol  (NORMODYNE ) injection 20 mg (20 mg Intravenous Given 06/15/24 2002)    Followed by  clevidipine  (CLEVIPREX ) infusion 0.5 mg/mL (8 mg/hr Intravenous Rate/Dose Change 06/15/24 2026)  tenecteplase  (TNKASE ) injection for stroke 20 mg (20 mg Intravenous Given 06/15/24 2017)  labetalol  (NORMODYNE ) injection 10 mg (10 mg Intravenous Given 06/15/24 2007)  iohexol  (OMNIPAQUE ) 350 MG/ML injection 75 mL (75 mLs Intravenous Contrast Given 06/15/24 2031)     IMPRESSION / MDM / ASSESSMENT AND PLAN / ED COURSE  I reviewed the triage vital signs and the nursing notes.                              Differential diagnosis includes, but is not limited to, complex migraine, CVA, ICH  Patient's presentation is most consistent with acute presentation with potential threat to life or bodily function.   The patient is on the cardiac monitor to evaluate for evidence of arrhythmia and/or significant heart rate changes.  Patient presented to the emergency department today because of concerns for headache, slurred speech and blurred vision.  Patient was called a code stroke.  Neurology evaluated the patient while in imaging and TNK was ordered.  At the time my exam patient continues to complain of headache.  CT without concerning intracranial hemorrhage or abnormality.  Discussed with ICU team for admission.      FINAL CLINICAL IMPRESSION(S) / ED DIAGNOSES   Final diagnoses:  Bad headache  Slurred speech      Note:  This document was prepared using Dragon voice recognition software and may include unintentional dictation errors.    Eagles Guadalupe, MD 06/16/24 2317  "

## 2024-06-15 NOTE — ED Notes (Signed)
 Pt to ED [facility] from [facility] per ems pt began with headache x1 hour pta. Pt did not alert staff of headache, pt called 911 herself. Pt has hx stroke with no deficits. EMS reports no neuro deficits at this time.

## 2024-06-15 NOTE — ED Triage Notes (Addendum)
 Pt brought in via ems from [facility].  Pt had dialysis today.  Pt has a headache since 1830 tonight.  No n/v/  hx cva.  Slurred speech noted in triage.   Pt reports blurred vision.   No otc meds for h/a.   Fsbs 99.  Pt to ct 1 with rn.  Code stroke called.

## 2024-06-15 NOTE — Progress Notes (Signed)
 PHARMACY CONSULT NOTE - ELECTROLYTES  Pharmacy Consult for Electrolyte Monitoring and Replacement   Recent Labs: Height: 5' 3 (160 cm) Weight: 81.6 kg (180 lb) IBW/kg (Calculated) : 52.4 Estimated Creatinine Clearance: 13.7 mL/min (A) (by C-G formula based on SCr of 4.86 mg/dL (H)). Potassium (mmol/L)  Date Value  06/15/2024 3.4 (L)   Magnesium (mg/dL)  Date Value  90/98/7974 2.4   Calcium  (mg/dL)  Date Value  98/85/7973 8.5 (L)   Albumin (g/dL)  Date Value  98/85/7973 4.8   Phosphorus (mg/dL)  Date Value  89/90/7974 3.3   Sodium (mmol/L)  Date Value  06/15/2024 141   Assessment  Allison Hill is a 54 y.o. female presenting to ED for code stroke. PMH significant for HTN and ESRD on HD. Pharmacy has been consulted to monitor and replace electrolytes.  Diet: NPO MIVF: NS @ 40 mL/hr Pertinent medications: N/A  Goal of Therapy: Electrolytes WNL  Plan:  K 3.4: Give KCL 10 mEq IV x2 Check BMP, Mg, Phos with AM labs  Thank you for allowing pharmacy to be a part of this patient's care.  Damien Napoleon, PharmD Clinical Pharmacist 06/15/2024 11:12 PM

## 2024-06-15 NOTE — ED Notes (Signed)
 ED secretary Armoni notified to activate Code stroke per triage nurse DELENA Monarch, RN; charge nurse LULLA Knee RN notified as well

## 2024-06-15 NOTE — ED Notes (Signed)
 Called to [email or web address]  Per RN Lisa/Activate Code Stroke/Rep Manisha.

## 2024-06-15 NOTE — ED Notes (Signed)
 Patient transported from CT to 22

## 2024-06-15 NOTE — Consult Note (Addendum)
 Stroke Neurology Consultation Note  Consult Requested by: Dr. Floy  Reason for Consult: code stroke  Consult Date: 06/15/2024   The history was obtained from the pt.  During history and examination, all items were able to obtain unless otherwise noted.  History of Present Illness:  Allison Hill is a 54 y.o. African American female with PMH of HTN and ESRD on HD presented to ED for code stroke. Per pt she had dialysis today and finished at noon. She was fine until 6:30pm when she had sudden onset HA at left temporal region with sharp pain, blurry vision in both eyes, but no N/V. Also found to have slurry speech. EMS called and on arrival glucose 99 and BP 179/100s. In ED, she was found to have left facial droop, left sided weakness with LUE ataxia. NIHSS = 9. Per pt, she supposed to take ASA at home but she does not take it. She has no recent surgery or bleeding. No clear contraindication for TNK. Risks, benefits and alternatives of IVT discussed with patient and she agreed. CTH personally reviewed prior to TNK administration. However, TNK was significantly delayed due to BP high needed labetalol  40mg  and cleviprex  infusion. CTA head and neck no LVO  LSN: 6:30pm TNK Given: Yes IR Thrombectomy? No, no LVO Modified Rankin Scale: 0-Completely asymptomatic and back to baseline post- stroke  Past Medical History:  Diagnosis Date   CHF (congestive heart failure) (HCC)    Hypertension    Renal disorder    ESRD on dialysis   Stroke (HCC)    L hemiparesis, 2015, 2023    History reviewed. No pertinent surgical history.  Family History  Problem Relation Age of Onset   Heart failure Mother    CAD Brother     Social History:  reports that she has quit smoking. Her smoking use included cigarettes. She has never used smokeless tobacco. She reports that she does not currently use alcohol. She reports current drug use. Drug: Codeine.  Allergies: Allergies[1]  Medications Ordered Prior to  Encounter[2]  Review of Systems: A full ROS was attempted today and was able to be performed.  Systems assessed include - Constitutional, Eyes, HENT, Respiratory, Cardiovascular, Gastrointestinal, Genitourinary, Integument/breast, Hematologic/lymphatic, Musculoskeletal, Neurological, Behavioral/Psych, Endocrine, Allergic/Immunologic - with pertinent responses as per HPI.  Physical Examination: Temp:  [98.9 F (37.2 C)] 98.9 F (37.2 C) (01/14 1916) Pulse Rate:  [74-104] 104 (01/14 2022) Resp:  [18] 18 (01/14 1916) BP: (135-197)/(86-122) 135/88 (01/14 2022) SpO2:  [99 %] 99 % (01/14 2007) Weight:  [81.6 kg] 81.6 kg (01/14 1923)  General - well nourished, well developed, in no apparent distress.    Ophthalmologic - fundi not visualized due to noncooperation.    Cardiovascular - regular rhythm and rate.   Neuro - awake, alert, eyes open, orientated to age, place, year but not to month. No aphasia, paucity of speech, following all simple commands. Able to name and repeat in mildly dysarthric voice. No gaze palsy, tracking bilaterally, visual field full. Left facial droop. Tongue midline. RUE no dirft. LUE pronator drift with finger grip decreased. RLE slight drift but LLE drift to bed within 5 sec. Sensation decreased on the left, L FTN mild ataxia out of proportion to the weakness, gait not tested.    NIH Stroke Scale  Level Of Consciousness 0=Alert; keenly responsive 1=Arouse to minor stimulation 2=Requires repeated stimulation to arouse or movements to pain 3=postures or unresponsive 0  LOC Questions to Month and Age 27=Answers both questions correctly  1=Answers one question correctly or dysarthria/intubated/trauma/language barrier 2=Answers neither question correctly or aphasia 1  LOC Commands      -Open/Close eyes     -Open/close grip     -Pantomime commands if communication barrier 0=Performs both tasks correctly 1=Performs one task correctly 2=Performs neighter task correctly 0   Best Gaze     -Only assess horizontal gaze 0=Normal 1=Partial gaze palsy 2=Forced deviation, or total gaze paresis 0  Visual 0=No visual loss 1=Partial hemianopia 2=Complete hemianopia 3=Bilateral hemianopia (blind including cortical blindness) 0  Facial Palsy     -Use grimace if obtunded 0=Normal symmetrical movement 1=Minor paralysis (asymmetry) 2=Partial paralysis (lower face) 3=Complete paralysis (upper and lower face) 1  Motor  0=No drift for 10/5 seconds 1=Drift, but does not hit bed 2=Some antigravity effort, hits  bed 3=No effort against gravity, limb falls 4=No movement 0=Amputation/joint fusion Right Arm 0     Leg 1    Left Arm 1     Leg 2  Limb Ataxia     - FNT/HTS 0=Absent or does not understand or paralyzed or amputation/joint fusion 1=Present in one limb 2=Present in two limbs 1  Sensory 0=Normal 1=Mild to moderate sensory loss 2=Severe to total sensory loss or coma/unresponsive 1  Best Language 0=No aphasia, normal 1=Mild to moderate aphasia 2=Severe aphasia 3=Mute, global aphasia, or coma/unresponsive 0  Dysarthria 0=Normal 1=Mild to moderate 2=Severe, unintelligible or mute/anarthric 0=intubated/unable to test 1  Extinction/Neglect 0=No abnormality 1=visual/tactile/auditory/spatia/personal inattention/Extinction to bilateral simultaneous stimulation 2=Profound neglect/extinction more than 1 modality  0  Total   9     Data Reviewed: CT HEAD CODE STROKE WO CONTRAST Result Date: 06/15/2024 CLINICAL DATA:  Code stroke. Initial evaluation for acute neuro deficit, stroke. No other relevant history is provided. EXAM: CT HEAD WITHOUT CONTRAST TECHNIQUE: Contiguous axial images were obtained from the base of the skull through the vertex without intravenous contrast. RADIATION DOSE REDUCTION: This exam was performed according to the departmental dose-optimization program which includes automated exposure control, adjustment of the mA and/or kV according to  patient size and/or use of iterative reconstruction technique. COMPARISON:  Prior exam from 03/08/2024. FINDINGS: Brain: Age-related cerebral atrophy with moderate chronic microvascular ischemic disease. Multiple scattered remote lacunar infarcts present about the deep gray nuclei. No acute intracranial hemorrhage. No acute large vessel territory infarct. No mass lesion or midline shift. No hydrocephalus or extra-axial fluid collection. Vascular: No abnormal hyperdense vessel. Scattered vascular calcifications noted within the carotid siphons. Skull: Scalp soft tissues within normal limits.  Calvarium intact. Sinuses/Orbits: Globes and orbital soft tissues demonstrate no acute finding. Visualized paranasal sinuses are clear. Small right mastoid effusion noted. Other: None. ASPECTS [facility] Stroke Program Early CT Score) - Ganglionic level infarction (caudate, lentiform nuclei, internal capsule, insula, M1-M3 cortex): 7 - Supraganglionic infarction (M4-M6 cortex): 3 Total score (0-10 with 10 being normal): 10 IMPRESSION: 1. No acute intracranial abnormality. 2. ASPECTS is 10. 3. Age-related cerebral atrophy with moderate chronic microvascular ischemic disease, with multiple remote lacunar infarcts about the deep gray nuclei. These results were communicated to Dr. Jerri at 7:49 pm on 06/15/2024 by text page via the [facility] messaging system. Electronically Signed   By: Morene Hoard M.D.   On: 06/15/2024 19:51   MM 3D SCREENING MAMMOGRAM BILATERAL BREAST Result Date: 06/13/2024 CLINICAL DATA:  Screening. EXAM: DIGITAL SCREENING BILATERAL MAMMOGRAM WITH TOMOSYNTHESIS AND CAD TECHNIQUE: Bilateral screening digital craniocaudal and mediolateral oblique mammograms were obtained. Bilateral screening digital breast tomosynthesis was performed. The images were evaluated with  computer-aided detection. COMPARISON:  None available. ACR Breast Density Category b: There are scattered areas of fibroglandular density. FINDINGS:  There are no findings suspicious for malignancy. IMPRESSION: No mammographic evidence of malignancy. A result letter of this screening mammogram will be mailed directly to the patient. RECOMMENDATION: Screening mammogram in one year. (Code:SM-B-01Y) BI-RADS CATEGORY  1: Negative. Electronically Signed   By: Dina  Arceo M.D.   On: 06/13/2024 12:35    Assessment: 54 y.o. female with PMH of HTN and ESRD on HD presented to ED for sudden onset HA, blurry vision in both eyes, slurry speech. EMS called and on arrival glucose 99 and BP 179/100s. In ED, she was found to have left facial droop, left sided weakness with LUE ataxia. NIHSS = 9. Per pt, she supposed to take ASA at home but she does not take it. She has no recent surgery or bleeding. No clear contraindication for TNK. Risks, benefits and alternatives of IVT discussed with patient and she agreed. CTH personally reviewed prior to TNK administration. However, TNK was significantly delayed due to BP high needed labetalol  40mg  and cleviprex  infusion. CTA head and neck no LVO. Pt will be admitted to ICU for post TNK care.   Plan: Admit to ICU by CCM for routine post IV thrombolytics care  Check blood pressure and NIHSS every 15 min for 2 h, then every 30 min for 6 h, and finally every hour for 16 h BP goal < 180/105 CT or MRI brain 24 hours post thrombolytics Stat CT head without contrast if acute neuro changes NPO until swallowing screen performed and passed No antiplatelet agents or anticoagulants (including heparin  for DVT prophylaxis) in first 24 hours No Foley catheter, nasogastric tube, arterial catheter or central venous catheter for 24 hours, unless absolutely necessary Telemetry Euglycemia  Avoid hyperthermia, PRN acetaminophen  DVT prophylaxis with SCDs Discussed with Dr .Floy ED physician Will follow   Thank you for this consultation and allowing us  to participate in the care of this patient.   Ary Cummins, MD PhD Stroke  Neurology 06/15/2024 8:50 PM       [1]  Allergies Allergen Reactions   Ceftriaxone Shortness Of Breath and Hives    Allergy, hives   Shellfish Allergy Hives   Minoxidil Swelling  [2]  No current facility-administered medications on file prior to encounter.   Current Outpatient Medications on File Prior to Encounter  Medication Sig Dispense Refill   acetaminophen  (TYLENOL ) 500 MG tablet Take 500 mg by mouth 3 (three) times daily.     amLODipine  (NORVASC ) 10 MG tablet Take 1 tablet (10 mg total) by mouth daily. 30 tablet 14   aspirin  EC 325 MG tablet Take 1 tablet (325 mg total) by mouth daily.     atorvastatin  (LIPITOR) 40 MG tablet Take 40 mg by mouth every evening.     calcitRIOL  (ROCALTROL ) 0.25 MCG capsule Take 1 capsule (0.25 mcg total) by mouth 4 (four) times a week. Take one capsule by mouth every Mon, Tue, Wed, Fri with dialysis (Patient taking differently: Take 0.75 mcg by mouth See admin instructions. Administered at dialysis every Mon, Tue, Wed, and Fri.)     calcium  carbonate (TUMS - DOSED IN MG ELEMENTAL CALCIUM ) 500 MG chewable tablet Chew 2 tablets (400 mg of elemental calcium  total) by mouth 2 (two) times daily. (Patient taking differently: Chew 400 mg of elemental calcium  by mouth 2 (two) times daily as needed for indigestion.)     cinacalcet  (SENSIPAR ) 60 MG tablet Take  120 mg by mouth in the morning.     citalopram (CELEXA) 10 MG tablet Take 10 mg by mouth daily.     cloNIDine  (CATAPRES  - DOSED IN MG/24 HR) 0.3 mg/24hr patch Place 0.3 mg onto the skin every Saturday.     diclofenac  Sodium (VOLTAREN ) 1 % GEL Apply 2 g topically 2 (two) times daily as needed (shoulder pain.).     epoetin  alfa (EPOGEN ) 4000 UNIT/ML injection Inject 1 mL (4,000 Units total) into the vein Every Tuesday,Thursday,and Saturday with dialysis. (Patient taking differently: Inject 4,000 Units into the vein See admin instructions. Administer 1 ml into the vein every Monday, Tuesday, Wednesday and  Friday. Given at dialysis)     fluticasone  (FLONASE ) 50 MCG/ACT nasal spray Place 1 spray into both nostrils daily as needed for allergies or rhinitis.     gabapentin  (NEURONTIN ) 100 MG capsule Take 200 mg by mouth 3 (three) times daily.     hydrALAZINE  (APRESOLINE ) 100 MG tablet Take 100 mg by mouth 3 (three) times daily.     indapamide (LOZOL) 1.25 MG tablet Take 1.25 mg by mouth daily at 12 noon.     irbesartan  (AVAPRO ) 300 MG tablet Take 1 tablet (300 mg total) by mouth daily. 30 tablet 0   isosorbide  mononitrate (IMDUR ) 120 MG 24 hr tablet Take 120 mg by mouth every evening.     labetalol  (NORMODYNE ) 300 MG tablet Take 300 mg by mouth 2 (two) times daily.     lidocaine  4 % Place 1 patch onto the skin at bedtime as needed (back pain.). Remove & Discard patch within 12 hours or as directed by MD. Apply to lower back in the night and remove at the morning.     melatonin 3 MG TABS tablet Take 3 mg by mouth at bedtime.     pantoprazole  (PROTONIX ) 40 MG tablet Take 1 tablet (40 mg total) by mouth daily.     polyethylene glycol (MIRALAX  / GLYCOLAX ) 17 g packet Take 17 g by mouth daily as needed.     polyethylene glycol (MIRALAX  / GLYCOLAX ) 17 g packet Take 17 g by mouth in the morning. Scheduled     sennosides-docusate sodium  (SENOKOT-S) 8.6-50 MG tablet Take 1 tablet by mouth daily.     sevelamer  carbonate (RENVELA ) 800 MG tablet Take 1,600 mg by mouth 3 (three) times daily with meals.     traZODone  (DESYREL ) 50 MG tablet Take 50 mg by mouth at bedtime.

## 2024-06-15 NOTE — ED Provider Triage Note (Signed)
 Emergency Medicine Provider Triage Evaluation Note  Allison Hill , a 54 y.o. female  was evaluated in triage.  Pt complains of bad headache, dialysis earlier today, history of 2 strokes.  Patient arrived via EMS from [facility].  Concerns of new stroke.  Complaints of blurred vision  Review of Systems  Positive:  Negative:   Physical Exam  BP (!) 179/115 (BP Location: Left Arm)   Pulse 94   Temp 98.9 F (37.2 C) (Oral)   Resp 18   Ht 5' 3 (1.6 m)   Wt 81.6 kg   SpO2 99%   BMI 31.89 kg/m  Gen:   Awake, no distress   Resp:  Normal effort  MSK:   Moves extremities without difficulty  Other:    Medical Decision Making  Medically screening exam initiated at 7:25 PM.  Appropriate orders placed.  Allison Hill was informed that the remainder of the evaluation will be completed by another provider, this initial triage assessment does not replace that evaluation, and the importance of remaining in the ED until their evaluation is complete.  Nursing staff to activate code stroke   Gasper Devere ORN, PA-C 06/15/24 1925

## 2024-06-15 NOTE — H&P (Signed)
 "  NAME:  Allison Hill, MRN:  [id number], DOB:  [birth year], LOS: 0 ADMISSION DATE:  06/15/2024, CONSULTATION DATE:  06/15/24 REFERRING MD:  Dr. Floy, CHIEF COMPLAINT:  Headache   History of Present Illness:  Allison Hill is a 54 year old female with a medical history significant for ESRD on hemodialysis, hypertension, a previous stroke and congestive heart failure who presented to [facility] ED for evaluation of an acute onset headache with slurred speech and vision changes. A code stroke was called upon arrival.   History is gathered per chart review and evaluation at bedside The patient reported that she received her normal dialysis session completed at noon and later around 6:30pm developed a severe headache with acute onset. At that time she also noticed blurred vision with slurred speech. She denied any nausea or vomiting. Upon EMS arrival, her glucose was 99 with a blood pressure of 179/100s. Per patient, she is supposed to take aspirin  at home but does not. She denied recent surgery or bleeding of any kind.   ED course: Upon arrival to the ED, she was found to have left sided facial droop, left sided extremity weakness and LUE ataxia. Initial NIHSS was 9. Neurology was consulted for evaluation.  A CT Head Code Stroke was completed. Due to hypertension, she received 40mg  of IV labetalol  and started on a cleviprex  infusion prior to TNK administration. She was later taken for a Cta of the head and neck.   Vitals on Arrival: temp: 98.9, BP 179/115, HR 94, RR 18, SpO2 99 on room air  Pertinent Labs/Diagnostics: Upon arrival she was found to have very mild hypokalemia with acute on chronic renal failure. Other lab results unremarkable  I, Robet Kim, PA-C personally viewed and interpreted this ECG. EKG Interpretation: Date: 06/16/24, EKG Time: 0046, Rate: 87, Rhythm: sinus rhythm, QRS Axis:  lvh with RAE, anterior Q waves Intervals: prolonged pr interval, prolonged QT interval ST/T Wave  abnormalities: no acute ST elevation, Narrative Interpretation: abnormal ECG with prolonged Qtc, RAE and LVH  Chemistry: Na+:141, K+: 3.4, BUN/Cr.: 37/4.86, Serum CO2/ AG: 27/15, AlkPhos 384, glucose 100  Hematology: WBC: 8.1, Hgb: 12.2, Platelets 179, aPTT: 43  CT Head Code Stroke Impression:  1. No acute intracranial abnormality. 2. ASPECTS is 10. 3. Age-related cerebral atrophy with moderate chronic microvascular ischemic disease, with multiple remote lacunar infarcts about the deep gray nuclei.  Cta Head/Neck Code Stroke Impression: IMPRESSION: 1. Negative CTA for large vessel occlusion or other emergent finding. 2. Mild atheromatous change about the carotid siphons without hemodynamically significant stenosis. 3. Diffuse tortuosity of the major arterial vasculature of the head and neck, suggesting chronic underlying hypertension.  Medications Administered: labetalol  40mg  + cleviprex  gtt, TNK, fentanyl  50mcg   PCCM consulted for admission due to post TNK administration.   Pertinent  Medical History  Stroke Hypertension Congestive Heart Failure ESRD on Hemodialysis History of Cocaine Abuse Chronic Thrombocytopenia  Significant Hospital Events: Including procedures, antibiotic start and stop dates in addition to other pertinent events   06/15/14: Presented to [facility] with acute headache with slurred speech and vision changes. Code stroke called, CT Head completed, given labetalol  and started on a cleviprex  gtt prior to TNK administration.  Interim History / Subjective:  Post TNK administration she is HD stable, with decreasing cleviprex  requirements. She reports a persistent headache that has been treated with pain medication. The headache is not new, has not changed from previous and has no new neurologic deficits  Objective    Blood pressure ROLLEN)  152/110, pulse 88, temperature 98.9 F (37.2 C), temperature source Oral, resp. rate 19, height 5' 3 (1.6 m), weight 81.6 kg,  SpO2 98%.       No intake or output data in the 24 hours ending 06/15/24 2226 Filed Weights   06/15/24 1916 06/15/24 1923  Weight: 81.6 kg 81.6 kg    Examination: General: Adult female acutely ill, lying in bed in NAD HENT: anicteric sclera, atraumatic, normocephalic, neck supple, no JVD CV: RRR, S1 S2, NSR on monitor, no r/m/g Pulm: Regular, non labored on room air , breath sounds equal throughout GI: soft, non-tender, non-distended, no rebound/guarding, bs x 4 Extremities: warm/dry, pulses + 2 R/P, no edema noted Neuro: A&O x 4, follows commands, right sided facial droop with LLE weakness and mild LUE ataxia, diminished sensation on LLE, PERRL GU: deferred   Resolved problem list   Assessment and Plan   #Suspected Stroke ~ Ruled out 06/15/24 CTH: no acute intracranial abnormality; age related cerebral atrophy with moderate chronic microvascular disease with multiple remote lacunar infarcts 06/15/24 CT angio head/neck/perfusion: Negative CTA for large vessel occlusion or other emergent finding. Mild atheromatous change about the carotid siphons without hemodynamically significant stenosis. Diffuse tortuosity of the major arterial vasculature of the head and neck, suggesting chronic underlying hypertension. - NIHSS q91min x 2 hours, then q x 6 h, then q1h x 16h followed by q4h until discharge - Bedside swallow > SLP eval if needed - Follow up MRI at 24 hours  - Echocardiogram ordered - Lipid panel wnl - Hgb A1c ordered - VTE prophylaxis: SCDs - No antiplatelets or anticoagulants for initial 24 hours - Not on [facility] prior to admission - PT/OT consult - STAT CT head if acute neuro changes develop - Ensure euglycemia - Avoid hyperthermia; tylenol  prn - Neurology following, appreciate input  #Hypertension - Continuous cardiac monitoring - Ensure MAP >65 ~ not requiring vasopressors - BP goal <180/105 - Continue cleviprex  gtt - Wean as able - Once off of cleviprex  gtt,  will add in prn antihypertensives - ECHO ordered  #Hypokalemia #ESRD on HD Creatinine on admission 4.86 - Trend BMP - Avoid nephrotoxins as able - Ensure adequate renal perfusion - Electrolyte replacement as indicated - Received K replacement overnight  #Transaminitis - Monitor hepatic function - RUQ US : negative for acute abnormality - Constipation protocol as indicated - NPO for now   Labs   CBC: Recent Labs  Lab 06/15/24 1943  WBC 8.1  NEUTROABS 5.7  HGB 12.2  HCT 38.3  MCV 81.5  PLT 179    Basic Metabolic Panel: Recent Labs  Lab 06/15/24 1943  NA 141  K 3.4*  CL 99  CO2 27  GLUCOSE 100*  BUN 37*  CREATININE 4.86*  CALCIUM  8.5*   GFR: Estimated Creatinine Clearance: 13.7 mL/min (A) (by C-G formula based on SCr of 4.86 mg/dL (H)). Recent Labs  Lab 06/15/24 1943  WBC 8.1    Liver Function Tests: Recent Labs  Lab 06/15/24 1943  AST 22  ALT 6  ALKPHOS 384*  BILITOT 0.4  PROT 8.2*  ALBUMIN 4.8   No results for input(s): LIPASE, AMYLASE in the last 168 hours. No results for input(s): AMMONIA in the last 168 hours.  ABG No results found for: PHART, PCO2ART, PO2ART, HCO3, TCO2, ACIDBASEDEF, O2SAT   Coagulation Profile: Recent Labs  Lab 06/15/24 1943  INR 0.9    Cardiac Enzymes: No results for input(s): CKTOTAL, CKMB, CKMBINDEX, TROPONINI in the last 168 hours.  HbA1C: Hgb A1c MFr Bld  Date/Time Value Ref Range Status  03/07/2024 08:24 PM 5.8 (H) 4.8 - 5.6 % Final    Comment:    (NOTE) Diagnosis of Diabetes The following HbA1c ranges recommended by the American Diabetes Association (ADA) may be used as an aid in the diagnosis of diabetes mellitus.  Hemoglobin             Suggested A1C NGSP%              Diagnosis  <5.7                   Non Diabetic  5.7-6.4                Pre-Diabetic  >6.4                   Diabetic  <7.0                   Glycemic control for                       adults with  diabetes.    09/10/2023 09:35 AM 4.4 (L) 4.8 - 5.6 % Final    Comment:    (NOTE)         Prediabetes: 5.7 - 6.4         Diabetes: >6.4         Glycemic control for adults with diabetes: <7.0     CBG: Recent Labs  Lab 06/15/24 1927  GLUCAP 99    Review of Systems:   Endorses headache without dizziness, vision changes, nausea/vomiting, chest pain or shortness of breath.   Past Medical History:  She,  has a past medical history of CHF (congestive heart failure) (HCC), Hypertension, Renal disorder, and Stroke (HCC).   Surgical History:  History reviewed. No pertinent surgical history.   Social History:   reports that she has quit smoking. Her smoking use included cigarettes. She has never used smokeless tobacco. She reports that she does not currently use alcohol. She reports current drug use. Drug: Codeine.   Family History:  Her family history includes CAD in her brother; Heart failure in her mother.   Allergies Allergies[1]   Home Medications  Prior to Admission medications  Medication Sig Start Date End Date Taking? Authorizing Provider  acetaminophen  (TYLENOL ) 500 MG tablet Take 500 mg by mouth 3 (three) times daily.   Yes [provider]  amLODipine  (NORVASC ) 10 MG tablet Take 1 tablet (10 mg total) by mouth daily. 01/24/24 06/07/25 Yes Ray, Nilsa, MD  aspirin  EC 325 MG tablet Take 1 tablet (325 mg total) by mouth daily. 03/11/24  Yes Lenon Marien CROME, MD  atorvastatin  (LIPITOR) 40 MG tablet Take 40 mg by mouth every evening. 07/05/23  Yes [provider]  calcitRIOL  (ROCALTROL ) 0.25 MCG capsule Take 1 capsule (0.25 mcg total) by mouth 4 (four) times a week. Take one capsule by mouth every Mon, Tue, Wed, Fri with dialysis Patient taking differently: Take 0.75 mcg by mouth See admin instructions. Administered at dialysis every Mon, Tue, Wed, and Fri. 12/10/23  Yes Dezii, Alexandra, DO  calcium  carbonate (TUMS - DOSED IN MG ELEMENTAL CALCIUM ) 500 MG  chewable tablet Chew 2 tablets (400 mg of elemental calcium  total) by mouth 2 (two) times daily. Patient taking differently: Chew 400 mg of elemental calcium  by mouth 2 (two) times daily as needed for indigestion. 09/23/23  Yes Awanda City,  MD  cinacalcet  (SENSIPAR ) 60 MG tablet Take 120 mg by mouth in the morning. 11/30/23  Yes [provider]  citalopram (CELEXA) 10 MG tablet Take 10 mg by mouth daily.   Yes [provider]  cloNIDine  (CATAPRES  - DOSED IN MG/24 HR) 0.3 mg/24hr patch Place 0.3 mg onto the skin every Saturday.   Yes [provider]  Dextran 70-Hypromellose (ARTIFICIAL TEARS PF OP) Apply to eye.   Yes [provider]  diclofenac  Sodium (VOLTAREN ) 1 % GEL Apply 2 g topically 2 (two) times daily as needed (shoulder pain.).   Yes [provider]  doxycycline (VIBRAMYCIN) 50 MG capsule Take 50 mg by mouth 2 (two) times daily. 06/13/24  Yes [provider]  fluticasone  (FLONASE ) 50 MCG/ACT nasal spray Place 1 spray into both nostrils daily as needed for allergies or rhinitis. 09/23/23  Yes Awanda City, MD  gabapentin  (NEURONTIN ) 100 MG capsule Take 200 mg by mouth 3 (three) times daily.   Yes [provider]  hydrALAZINE  (APRESOLINE ) 100 MG tablet Take 100 mg by mouth 3 (three) times daily. 05/19/24  Yes [provider]  indapamide (LOZOL) 1.25 MG tablet Take 1.25 mg by mouth daily at 12 noon. 05/23/24  Yes [provider]  irbesartan  (AVAPRO ) 300 MG tablet Take 1 tablet (300 mg total) by mouth daily. 02/02/24  Yes Wieting, Richard, MD  isosorbide  mononitrate (IMDUR ) 120 MG 24 hr tablet Take 120 mg by mouth every evening.   Yes [provider]  labetalol  (NORMODYNE ) 300 MG tablet Take 300 mg by mouth 2 (two) times daily. 06/05/24  Yes [provider]  lidocaine  4 % Place 1 patch onto the skin at bedtime as needed (back pain.). Remove & Discard patch within 12 hours or as directed by MD. Apply to lower  back in the night and remove at the morning.   Yes [provider]  melatonin 3 MG TABS tablet Take 3 mg by mouth at bedtime. 12/26/22  Yes [provider]  pantoprazole  (PROTONIX ) 40 MG tablet Take 1 tablet (40 mg total) by mouth daily. 09/24/23  Yes Awanda City, MD  polyethylene glycol (MIRALAX  / GLYCOLAX ) 17 g packet Take 17 g by mouth in the morning. Scheduled   Yes [provider]  sennosides-docusate sodium  (SENOKOT-S) 8.6-50 MG tablet Take 1 tablet by mouth daily.   Yes [provider]  sevelamer  carbonate (RENVELA ) 800 MG tablet Take 1,600 mg by mouth 3 (three) times daily with meals.   Yes [provider]  traZODone  (DESYREL ) 50 MG tablet Take 50 mg by mouth at bedtime. 03/03/24  Yes [provider]  epoetin  alfa (EPOGEN ) 4000 UNIT/ML injection Inject 1 mL (4,000 Units total) into the vein Every Tuesday,Thursday,and Saturday with dialysis. Patient taking differently: Inject 4,000 Units into the vein See admin instructions. Administer 1 ml into the vein every Monday, Tuesday, Wednesday and Friday. Given at dialysis 09/24/23   Awanda City, MD  polyethylene glycol (MIRALAX  / GLYCOLAX ) 17 g packet Take 17 g by mouth daily as needed. 09/23/23   Awanda City, MD     Critical care time: 65 minutes      Rahil Passey, PA-C [facility] P[facility]M Team Contact Info: [phone]        [1]  Allergies Allergen Reactions   Ceftriaxone Shortness Of Breath and Hives    Allergy, hives   Shellfish Allergy Hives   Minoxidil Swelling   "

## 2024-06-16 ENCOUNTER — Inpatient Hospital Stay

## 2024-06-16 ENCOUNTER — Inpatient Hospital Stay: Admit: 2024-06-16 | Discharge: 2024-06-16 | Disposition: A

## 2024-06-16 ENCOUNTER — Other Ambulatory Visit: Payer: Self-pay

## 2024-06-16 DIAGNOSIS — N186 End stage renal disease: Secondary | ICD-10-CM | POA: Diagnosis not present

## 2024-06-16 DIAGNOSIS — I120 Hypertensive chronic kidney disease with stage 5 chronic kidney disease or end stage renal disease: Secondary | ICD-10-CM | POA: Diagnosis not present

## 2024-06-16 DIAGNOSIS — I69354 Hemiplegia and hemiparesis following cerebral infarction affecting left non-dominant side: Secondary | ICD-10-CM | POA: Diagnosis not present

## 2024-06-16 DIAGNOSIS — I161 Hypertensive emergency: Secondary | ICD-10-CM | POA: Diagnosis not present

## 2024-06-16 DIAGNOSIS — Z992 Dependence on renal dialysis: Secondary | ICD-10-CM | POA: Diagnosis not present

## 2024-06-16 DIAGNOSIS — I10 Essential (primary) hypertension: Secondary | ICD-10-CM | POA: Diagnosis not present

## 2024-06-16 LAB — ECHOCARDIOGRAM COMPLETE
AR max vel: 1.91 cm2
AV Area VTI: 1.75 cm2
AV Area mean vel: 1.81 cm2
AV Mean grad: 9 mmHg
AV Peak grad: 16.5 mmHg
Ao pk vel: 2.03 m/s
Area-P 1/2: 4.93 cm2
Height: 63 in
MV VTI: 3.02 cm2
S' Lateral: 2.9 cm
Weight: 2880 [oz_av]

## 2024-06-16 LAB — MAGNESIUM: Magnesium: 2.4 mg/dL (ref 1.7–2.4)

## 2024-06-16 LAB — COMPREHENSIVE METABOLIC PANEL WITH GFR
ALT: <5 U/L (ref 0–44)
AST: 17 U/L (ref 15–41)
Albumin: 4.2 g/dL (ref 3.5–5.0)
Alkaline Phosphatase: 347 U/L — ABNORMAL HIGH (ref 38–126)
Anion gap: 13 (ref 5–15)
BUN: 38 mg/dL — ABNORMAL HIGH (ref 6–20)
CO2: 26 mmol/L (ref 22–32)
Calcium: 8.1 mg/dL — ABNORMAL LOW (ref 8.9–10.3)
Chloride: 101 mmol/L (ref 98–111)
Creatinine, Ser: 5.18 mg/dL — ABNORMAL HIGH (ref 0.44–1.00)
GFR, Estimated: 9 mL/min — ABNORMAL LOW
Glucose, Bld: 85 mg/dL (ref 70–99)
Potassium: 3.4 mmol/L — ABNORMAL LOW (ref 3.5–5.1)
Sodium: 140 mmol/L (ref 135–145)
Total Bilirubin: 0.4 mg/dL (ref 0.0–1.2)
Total Protein: 7.3 g/dL (ref 6.5–8.1)

## 2024-06-16 LAB — LIPID PANEL
Cholesterol: 113 mg/dL (ref 0–200)
HDL: 59 mg/dL
LDL Cholesterol: 44 mg/dL (ref 0–99)
Total CHOL/HDL Ratio: 1.9 ratio
Triglycerides: 50 mg/dL
VLDL: 10 mg/dL (ref 0–40)

## 2024-06-16 LAB — CBC
HCT: 35.7 % — ABNORMAL LOW (ref 36.0–46.0)
Hemoglobin: 11.3 g/dL — ABNORMAL LOW (ref 12.0–15.0)
MCH: 25.9 pg — ABNORMAL LOW (ref 26.0–34.0)
MCHC: 31.7 g/dL (ref 30.0–36.0)
MCV: 81.9 fL (ref 80.0–100.0)
Platelets: 160 K/uL (ref 150–400)
RBC: 4.36 MIL/uL (ref 3.87–5.11)
RDW: 18.3 % — ABNORMAL HIGH (ref 11.5–15.5)
WBC: 6.6 K/uL (ref 4.0–10.5)
nRBC: 0 % (ref 0.0–0.2)

## 2024-06-16 LAB — URINE DRUG SCREEN
Amphetamines: NEGATIVE
Barbiturates: NEGATIVE
Benzodiazepines: NEGATIVE
Cocaine: NEGATIVE
Fentanyl: NEGATIVE
Methadone Scn, Ur: NEGATIVE
Opiates: NEGATIVE
Tetrahydrocannabinol: NEGATIVE

## 2024-06-16 LAB — HEPATITIS B SURFACE ANTIGEN: Hepatitis B Surface Ag: NONREACTIVE

## 2024-06-16 LAB — HEMOGLOBIN A1C: Hgb A1c MFr Bld: <4.2 % — ABNORMAL LOW (ref 4.8–5.6)

## 2024-06-16 LAB — MRSA NEXT GEN BY PCR, NASAL: MRSA by PCR Next Gen: NOT DETECTED

## 2024-06-16 LAB — PHOSPHORUS: Phosphorus: 2.9 mg/dL (ref 2.5–4.6)

## 2024-06-16 MED ORDER — ISOSORBIDE MONONITRATE ER 60 MG PO TB24
120.0000 mg | ORAL_TABLET | Freq: Every evening | ORAL | Status: DC
  Administered 2024-06-16 – 2024-06-20 (×5): 120 mg via ORAL
  Filled 2024-06-16 (×5): qty 2

## 2024-06-16 MED ORDER — IRBESARTAN 150 MG PO TABS
300.0000 mg | ORAL_TABLET | Freq: Every day | ORAL | Status: DC
  Administered 2024-06-16 – 2024-06-20 (×4): 300 mg via ORAL
  Filled 2024-06-16 (×5): qty 2

## 2024-06-16 MED ORDER — LORAZEPAM 2 MG PO TABS
2.0000 mg | ORAL_TABLET | Freq: Once | ORAL | Status: AC
  Administered 2024-06-16: 2 mg via ORAL
  Filled 2024-06-16: qty 1

## 2024-06-16 MED ORDER — HYDRALAZINE HCL 20 MG/ML IJ SOLN: 10.0000 mg | INTRAMUSCULAR | Status: DC | PRN

## 2024-06-16 MED ORDER — LABETALOL HCL 200 MG PO TABS
300.0000 mg | ORAL_TABLET | Freq: Two times a day (BID) | ORAL | Status: DC
  Administered 2024-06-16 – 2024-06-20 (×9): 300 mg via ORAL
  Filled 2024-06-16 (×14): qty 1

## 2024-06-16 MED ORDER — ATORVASTATIN CALCIUM 20 MG PO TABS
40.0000 mg | ORAL_TABLET | Freq: Every evening | ORAL | Status: DC
  Administered 2024-06-16 – 2024-06-20 (×5): 40 mg via ORAL
  Filled 2024-06-16 (×5): qty 2

## 2024-06-16 MED ORDER — PANTOPRAZOLE SODIUM 40 MG PO TBEC
40.0000 mg | DELAYED_RELEASE_TABLET | Freq: Every day | ORAL | Status: DC
  Administered 2024-06-16 – 2024-06-20 (×4): 40 mg via ORAL
  Filled 2024-06-16 (×4): qty 1

## 2024-06-16 MED ORDER — CHLORHEXIDINE GLUCONATE CLOTH 2 % EX PADS
6.0000 | MEDICATED_PAD | Freq: Every day | CUTANEOUS | Status: DC
  Administered 2024-06-19 – 2024-06-20 (×2): 6 via TOPICAL
  Filled 2024-06-16 (×2): qty 6

## 2024-06-16 MED ORDER — HEPARIN SODIUM (PORCINE) 5000 UNIT/ML IJ SOLN
5000.0000 [IU] | Freq: Three times a day (TID) | INTRAMUSCULAR | Status: DC
  Administered 2024-06-16 – 2024-06-19 (×9): 5000 [IU] via SUBCUTANEOUS
  Filled 2024-06-16 (×12): qty 1

## 2024-06-16 MED ORDER — POTASSIUM CHLORIDE 10 MEQ/100ML IV SOLN
10.0000 meq | INTRAVENOUS | Status: AC
  Administered 2024-06-16 (×2): 10 meq via INTRAVENOUS
  Filled 2024-06-16: qty 100

## 2024-06-16 MED ORDER — AMLODIPINE BESYLATE 10 MG PO TABS
10.0000 mg | ORAL_TABLET | Freq: Every day | ORAL | Status: DC
  Administered 2024-06-16 – 2024-06-20 (×4): 10 mg via ORAL
  Filled 2024-06-16: qty 1
  Filled 2024-06-16: qty 2
  Filled 2024-06-16: qty 1
  Filled 2024-06-16: qty 2

## 2024-06-16 MED ORDER — HYDRALAZINE HCL 50 MG PO TABS
100.0000 mg | ORAL_TABLET | Freq: Three times a day (TID) | ORAL | Status: DC
  Administered 2024-06-16 – 2024-06-17 (×3): 100 mg via ORAL
  Filled 2024-06-16 (×3): qty 2

## 2024-06-16 MED ORDER — ASPIRIN 81 MG PO TBEC
81.0000 mg | DELAYED_RELEASE_TABLET | Freq: Every day | ORAL | Status: DC
  Administered 2024-06-16 – 2024-06-20 (×5): 81 mg via ORAL
  Filled 2024-06-16 (×5): qty 1

## 2024-06-16 NOTE — ED Notes (Signed)
 Resuming Q 1hr neuro checks at this time

## 2024-06-16 NOTE — ED Notes (Signed)
 Pneumatic SCDs applied, in progress intermittently

## 2024-06-16 NOTE — Progress Notes (Signed)
 Pt receives outpt HD at [facility]. Navigator following to assist with any HD needs.   Suzen Satchel Dialysis Navigator [phone]

## 2024-06-16 NOTE — Progress Notes (Signed)
 OT Cancellation Note  Patient Details Name: Allison Hill MRN: [id number] DOB: 1970-09-29   Cancelled Treatment:    Reason Eval/Treat Not Completed: Other (comment) Order received and chart reviewed. TNK given at 2017 on 1/14, to have f/u MRI at 1700 today. Therapy will follow up following protocol after repeat imaging completed.  Joseguadalupe Stan E Chrismon 06/16/2024, 7:54 AM

## 2024-06-16 NOTE — Progress Notes (Signed)
 PT Cancellation Note  Patient Details Name: Ziyanna Tolin MRN: [id number] DOB: 1970-11-03   Cancelled Treatment:    Reason Eval/Treat Not Completed: Medical issues which prohibited therapy (Consult received and chart reviewed. Patient s/p TNK administration (1/14 @2017 ). Per guidelines, strict bedrest x24 hours. Will continue to follow and initiate as medically appropriate.)   Luan Maberry H. Delores, PT, DPT, NCS 06/16/24, 9:15 AM [phone]

## 2024-06-16 NOTE — Progress Notes (Signed)
*  PRELIMINARY RESULTS* Echocardiogram 2D Echocardiogram has been performed.  Allison Hill 06/16/2024, 12:53 PM

## 2024-06-16 NOTE — Plan of Care (Signed)
 Pt MRI negative for acute stroke. Pt symptoms on admission likely due to recrudescence of previous stroke in the setting of hypertensive emergency. Currently BP stable on low dose cleviprex  infusion. Has started part of her PO BP meds. Continue taper down cleviprex  if able.will start her on ASA 81, continue statin. Pending PT and OT. OK to start DVT prophylaxis. She will follow up with [facility] neurology.   Neuro will sign off, please call with questions.   Ary Cummins, MD PhD Stroke Neurology 06/16/2024 8:51 PM

## 2024-06-16 NOTE — ED Notes (Addendum)
 Pt returns from MRI, sleepy after ativan  sedation for MRI, sonorous resps, arousable to voice, answers questions, follows commands. VSS. Cleviprex  continues at 4mg /hr. Pure wick and SCDs applied/ activated.

## 2024-06-16 NOTE — ED Notes (Signed)
 This tech and tech Con found  that pt experienced incontinence at this time. Peri-care provided, new brief and chuck placed. Pt is dry and comfortable at this time.

## 2024-06-16 NOTE — ED Notes (Signed)
 L side remains weaker, but not drift, and no ataxia. Facial palsy improved. Sensation at baseline decreased on L. No untoward changes.

## 2024-06-16 NOTE — ED Notes (Signed)
Pt eating, NAD, calm, interactive.

## 2024-06-16 NOTE — ED Notes (Signed)
 Called CCMD to add pt to monitoring.

## 2024-06-16 NOTE — ED Notes (Signed)
 Alert, NAD, calm, interactive, eating, titrating cleviprex  for BP

## 2024-06-16 NOTE — ED Notes (Signed)
 Pt in MRI, cleviprex  switched to MRI pump, transferring to MRI table at this time. No obvious untoward changes. EMTP remains with pt.

## 2024-06-16 NOTE — Progress Notes (Signed)
 STROKE TEAM PROGRESS NOTE   SUBJECTIVE (INTERVAL HISTORY) No family is at the bedside.  Overall her condition is rapidly improving. Her BP under control with cleviprex . Denies HA. Stated that yesterday with HA, blurry vision and dizziness, she felt her left sided numbness getting worse and weakness also getting worse. But now at her baseline.    OBJECTIVE Temp:  [98.1 F (36.7 C)-98.9 F (37.2 C)] 98.6 F (37 C) (01/15 1346) Pulse Rate:  [74-104] 83 (01/15 1545) Cardiac Rhythm: Normal sinus rhythm (01/15 1432) Resp:  [6-34] 13 (01/15 1545) BP: (114-225)/(77-131) 165/104 (01/15 1545) SpO2:  [95 %-100 %] 100 % (01/15 1545) Weight:  [81.6 kg] 81.6 kg (01/14 1923)  Recent Labs  Lab 06/15/24 1927  GLUCAP 99   Recent Labs  Lab 06/15/24 1943 06/16/24 0304  NA 141 140  K 3.4* 3.4*  CL 99 101  CO2 27 26  GLUCOSE 100* 85  BUN 37* 38*  CREATININE 4.86* 5.18*  CALCIUM  8.5* 8.1*  MG  --  2.4  PHOS  --  2.9   Recent Labs  Lab 06/15/24 1943 06/16/24 0304  AST 22 17  ALT 6 <5  ALKPHOS 384* 347*  BILITOT 0.4 0.4  PROT 8.2* 7.3  ALBUMIN 4.8 4.2   Recent Labs  Lab 06/15/24 1943 06/16/24 0304  WBC 8.1 6.6  NEUTROABS 5.7  --   HGB 12.2 11.3*  HCT 38.3 35.7*  MCV 81.5 81.9  PLT 179 160   No results for input(s): CKTOTAL, CKMB, CKMBINDEX, TROPONINI in the last 168 hours. Recent Labs    06/15/24 1943  LABPROT 12.7  INR 0.9   No results for input(s): COLORURINE, LABSPEC, PHURINE, GLUCOSEU, HGBUR, BILIRUBINUR, KETONESUR, PROTEINUR, UROBILINOGEN, NITRITE, LEUKOCYTESUR in the last 72 hours.  Invalid input(s): APPERANCEUR     Component Value Date/Time   CHOL 113 06/16/2024 0303   TRIG 50 06/16/2024 0303   HDL 59 06/16/2024 0303   CHOLHDL 1.9 06/16/2024 0303   VLDL 10 06/16/2024 0303   LDLCALC 44 06/16/2024 0303   Lab Results  Component Value Date   HGBA1C <4.2 (L) 06/16/2024      Component Value Date/Time   LABOPIA NEGATIVE  06/16/2024 0303   COCAINSCRNUR NEGATIVE 06/16/2024 0303   COCAINSCRNUR NONE DETECTED 03/08/2024 0607   LABBENZ NEGATIVE 06/16/2024 0303   AMPHETMU NEGATIVE 06/16/2024 0303   THCU NEGATIVE 06/16/2024 0303   LABBARB NEGATIVE 06/16/2024 0303    Recent Labs  Lab 06/15/24 2233  ETH <15    I have personally reviewed the radiological images below and agree with the radiology interpretations.  ECHOCARDIOGRAM COMPLETE Result Date: 06/16/2024    ECHOCARDIOGRAM REPORT   Patient Name:   Allison Hill Date of Exam: 06/16/2024 Medical Rec #:  [id number]        Height:       63.0 in Accession #:    [id number]       Weight:       180.0 lb Date of Birth:  [birth year]        BSA:          1.849 m Patient Age:    53 years         BP:           143/95 mmHg Patient Gender: F                HR:           85 bpm. Exam Location:  [facility] Procedure: 2D Echo, Cardiac  Doppler, Color Doppler, 3D Echo, Saline Contrast            Bubble Study and Strain Analysis (Both Spectral and Color Flow            Doppler were utilized during procedure). Indications:     Stroke I63.9  History:         Patient has prior history of Echocardiogram examinations, most                  recent 03/08/2024. CHF and Transplant Complications, Stroke;                  Risk Factors:Hypertension.  Sonographer:     Christopher Furnace Referring Phys:  [id number] ROBET KIM Diagnosing Phys: Evalene Lunger MD  Sonographer Comments: Global longitudinal strain was attempted. IMPRESSIONS  1. Left ventricular ejection fraction, by estimation, is 60 to 65%. The left ventricle has normal function. The left ventricle has no regional wall motion abnormalities. There is mild left ventricular hypertrophy. Left ventricular diastolic parameters are consistent with Grade I diastolic dysfunction (impaired relaxation).  2. Right ventricular systolic function is normal. The right ventricular size is normal. There is normal pulmonary artery systolic pressure. The estimated right  ventricular systolic pressure is 25.4 mmHg.  3. The mitral valve is normal in structure. Mild mitral valve regurgitation. No evidence of mitral stenosis.  4. The aortic valve is normal in structure. Aortic valve regurgitation is not visualized. No aortic stenosis is present.  5. The inferior vena cava is normal in size with greater than 50% respiratory variability, suggesting right atrial pressure of 3 mmHg.  6. Agitated saline contrast bubble study was negative, with no evidence of any interatrial shunt. FINDINGS  Left Ventricle: Left ventricular ejection fraction, by estimation, is 60 to 65%. The left ventricle has normal function. The left ventricle has no regional wall motion abnormalities. The global longitudinal strain is normal despite suboptimal segment tracking. The left ventricular internal cavity size was normal in size. There is mild left ventricular hypertrophy. Left ventricular diastolic parameters are consistent with Grade I diastolic dysfunction (impaired relaxation). Right Ventricle: The right ventricular size is normal. No increase in right ventricular wall thickness. Right ventricular systolic function is normal. There is normal pulmonary artery systolic pressure. The tricuspid regurgitant velocity is 2.26 m/s, and  with an assumed right atrial pressure of 5 mmHg, the estimated right ventricular systolic pressure is 25.4 mmHg. Left Atrium: Left atrial size was normal in size. Right Atrium: Right atrial size was normal in size. Pericardium: There is no evidence of pericardial effusion. Mitral Valve: The mitral valve is normal in structure. Mild mitral valve regurgitation. No evidence of mitral valve stenosis. MV peak gradient, 7.5 mmHg. The mean mitral valve gradient is 3.0 mmHg. Tricuspid Valve: The tricuspid valve is normal in structure. Tricuspid valve regurgitation is not demonstrated. No evidence of tricuspid stenosis. Aortic Valve: The aortic valve is normal in structure. Aortic valve  regurgitation is not visualized. No aortic stenosis is present. Aortic valve mean gradient measures 9.0 mmHg. Aortic valve peak gradient measures 16.5 mmHg. Aortic valve area, by VTI measures 1.75 cm. Pulmonic Valve: The pulmonic valve was normal in structure. Pulmonic valve regurgitation is not visualized. No evidence of pulmonic stenosis. Aorta: The aortic root is normal in size and structure. Venous: The inferior vena cava is normal in size with greater than 50% respiratory variability, suggesting right atrial pressure of 3 mmHg. IAS/Shunts: No atrial level shunt detected by color flow Doppler.  Agitated saline contrast was given intravenously to evaluate for intracardiac shunting. Agitated saline contrast bubble study was negative, with no evidence of any interatrial shunt. There  is no evidence of a patent foramen ovale. There is no evidence of an atrial septal defect. Additional Comments: 3D was performed not requiring image post processing on an independent workstation and was indeterminate.  LEFT VENTRICLE PLAX 2D LVIDd:         4.40 cm   Diastology LVIDs:         2.90 cm   LV e' medial:    6.31 cm/s LV PW:         1.70 cm   LV E/e' medial:  10.4 LV IVS:        1.40 cm   LV e' lateral:   3.15 cm/s LVOT diam:     2.10 cm   LV E/e' lateral: 20.8 LV SV:         62 LV SV Index:   33        2D Longitudinal Strain LVOT Area:     3.46 cm  2D Strain GLS Avg:     -9.4 % LV IVRT:       111 msec  RIGHT VENTRICLE RV Basal diam:  2.90 cm RV Mid diam:    2.40 cm RV S prime:     15.90 cm/s TAPSE (M-mode): 2.4 cm LEFT ATRIUM             Index        RIGHT ATRIUM           Index LA diam:        3.00 cm 1.62 cm/m   RA Area:     16.30 cm LA Vol (A2C):   65.3 ml 35.32 ml/m  RA Volume:   40.90 ml  22.12 ml/m LA Vol (A4C):   72.5 ml 39.21 ml/m LA Biplane Vol: 73.4 ml 39.70 ml/m  AORTIC VALVE AV Area (Vmax):    1.91 cm AV Area (Vmean):   1.81 cm AV Area (VTI):     1.75 cm AV Vmax:           203.33 cm/s AV Vmean:           136.333 cm/s AV VTI:            0.353 m AV Peak Grad:      16.5 mmHg AV Mean Grad:      9.0 mmHg LVOT Vmax:         112.00 cm/s LVOT Vmean:        71.400 cm/s LVOT VTI:          0.178 m LVOT/AV VTI ratio: 0.50  AORTA Ao Root diam: 3.70 cm MITRAL VALVE                TRICUSPID VALVE MV Area (PHT): 4.93 cm     TR Peak grad:   20.4 mmHg MV Area VTI:   3.02 cm     TR Vmax:        226.00 cm/s MV Peak grad:  7.5 mmHg MV Mean grad:  3.0 mmHg     SHUNTS MV Vmax:       1.37 m/s     Systemic VTI:  0.18 m MV Vmean:      87.0 cm/s    Systemic Diam: 2.10 cm MV Decel Time: 154 msec MV E velocity: 65.50 cm/s MV A velocity: 101.00 cm/s MV E/A ratio:  0.65 Timothy  Perla MD Electronically signed by Evalene Perla MD Signature Date/Time: 06/16/2024/1:26:40 PM    Final    US  Abdomen Limited RUQ (LIVER/GB) Result Date: 06/16/2024 CLINICAL DATA:  Elevated LFTs EXAM: ULTRASOUND ABDOMEN LIMITED RIGHT UPPER QUADRANT COMPARISON:  None Available. FINDINGS: Gallbladder: No gallstones or wall thickening visualized. No sonographic Murphy sign noted by sonographer. Common bile duct: Diameter: 4.9 mm. Liver: No focal lesion identified. Within normal limits in parenchymal echogenicity. Portal vein is patent on color Doppler imaging with normal direction of blood flow towards the liver. Other: Note is made of a small echogenic right kidney consistent with the patient's known history of end-stage renal disease. IMPRESSION: No acute abnormality in the right upper quadrant. Electronically Signed   By: Oneil Devonshire M.D.   On: 06/16/2024 02:33   CT ANGIO HEAD NECK W WO CM (CODE STROKE) Result Date: 06/15/2024 CLINICAL DATA:  Initial evaluation for acute neuro deficit, stroke. Blurry vision, headache. EXAM: CT ANGIOGRAPHY HEAD AND NECK WITH AND WITHOUT CONTRAST TECHNIQUE: Multidetector CT imaging of the head and neck was performed using the standard protocol during bolus administration of intravenous contrast. Multiplanar CT image reconstructions  and MIPs were obtained to evaluate the vascular anatomy. Carotid stenosis measurements (when applicable) are obtained utilizing NASCET criteria, using the distal internal carotid diameter as the denominator. RADIATION DOSE REDUCTION: This exam was performed according to the departmental dose-optimization program which includes automated exposure control, adjustment of the mA and/or kV according to patient size and/or use of iterative reconstruction technique. CONTRAST:  75mL OMNIPAQUE  IOHEXOL  350 MG/ML SOLN COMPARISON:  CT from earlier the same day as well as prior exam from 03/07/2024. FINDINGS: CTA NECK FINDINGS Aortic arch: Dense aortic arch within normal limits for caliber with standard branch pattern. Aortic atherosclerosis. No stenosis about the origin the great vessels. Right carotid system: Right common and internal carotid arteries are tortuous but patent without stenosis or dissection. Left carotid system: Left common and internal carotid arteries are tortuous but patent without stenosis or dissection. Vertebral arteries: Both vertebral arteries patent without stenosis or dissection. Left vertebral artery slightly dominant. Skeleton: Diffuse sclerosis throughout the visualized osseous structures, likely reflecting changes of renal osteodystrophy. No worrisome osseous lesions. Moderate spondylosis at C5-6. Other neck: No other acute finding. Right-sided central venous catheter in place. Upper chest: No other acute finding. Review of the MIP images confirms the above findings CTA HEAD FINDINGS Anterior circulation: Mild atheromatous change about the carotid siphons without hemodynamically significant stenosis. A1 segments patent bilaterally. Normal anterior communicating artery complex. Anterior cerebral arteries patent without significant stenosis. No M1 stenosis or occlusion. No proximal MCA branch occlusion or high-grade stenosis. Distal MCA branches perfused and symmetric. Posterior circulation: Mild  diffuse dolichoectasia of the posterior circulation. Both V4 segments patent without stenosis. Left vertebral artery dominant. Neither PICA origin well visualized. Basilar diffusely ectatic and patent without stenosis. Superior cerebellar and posterior cerebral arteries patent bilaterally. Venous sinuses: Patent allowing for timing the contrast bolus. Anatomic variants: None significant.  No aneurysm. Review of the MIP images confirms the above findings IMPRESSION: 1. Negative CTA for large vessel occlusion or other emergent finding. 2. Mild atheromatous change about the carotid siphons without hemodynamically significant stenosis. 3. Diffuse tortuosity of the major arterial vasculature of the head and neck, suggesting chronic underlying hypertension. Aortic Atherosclerosis (ICD10-I70.0). Electronically Signed   By: Morene Hoard M.D.   On: 06/15/2024 21:11   CT HEAD CODE STROKE WO CONTRAST Result Date: 06/15/2024 CLINICAL DATA:  Code stroke. Initial  evaluation for acute neuro deficit, stroke. No other relevant history is provided. EXAM: CT HEAD WITHOUT CONTRAST TECHNIQUE: Contiguous axial images were obtained from the base of the skull through the vertex without intravenous contrast. RADIATION DOSE REDUCTION: This exam was performed according to the departmental dose-optimization program which includes automated exposure control, adjustment of the mA and/or kV according to patient size and/or use of iterative reconstruction technique. COMPARISON:  Prior exam from 03/08/2024. FINDINGS: Brain: Age-related cerebral atrophy with moderate chronic microvascular ischemic disease. Multiple scattered remote lacunar infarcts present about the deep gray nuclei. No acute intracranial hemorrhage. No acute large vessel territory infarct. No mass lesion or midline shift. No hydrocephalus or extra-axial fluid collection. Vascular: No abnormal hyperdense vessel. Scattered vascular calcifications noted within the carotid  siphons. Skull: Scalp soft tissues within normal limits.  Calvarium intact. Sinuses/Orbits: Globes and orbital soft tissues demonstrate no acute finding. Visualized paranasal sinuses are clear. Small right mastoid effusion noted. Other: None. ASPECTS [facility] Stroke Program Early CT Score) - Ganglionic level infarction (caudate, lentiform nuclei, internal capsule, insula, M1-M3 cortex): 7 - Supraganglionic infarction (M4-M6 cortex): 3 Total score (0-10 with 10 being normal): 10 IMPRESSION: 1. No acute intracranial abnormality. 2. ASPECTS is 10. 3. Age-related cerebral atrophy with moderate chronic microvascular ischemic disease, with multiple remote lacunar infarcts about the deep gray nuclei. These results were communicated to Dr. Jerri at 7:49 pm on 06/15/2024 by text page via the [facility] messaging system. Electronically Signed   By: Morene Hoard M.D.   On: 06/15/2024 19:51   MM 3D SCREENING MAMMOGRAM BILATERAL BREAST Result Date: 06/13/2024 CLINICAL DATA:  Screening. EXAM: DIGITAL SCREENING BILATERAL MAMMOGRAM WITH TOMOSYNTHESIS AND CAD TECHNIQUE: Bilateral screening digital craniocaudal and mediolateral oblique mammograms were obtained. Bilateral screening digital breast tomosynthesis was performed. The images were evaluated with computer-aided detection. COMPARISON:  None available. ACR Breast Density Category b: There are scattered areas of fibroglandular density. FINDINGS: There are no findings suspicious for malignancy. IMPRESSION: No mammographic evidence of malignancy. A result letter of this screening mammogram will be mailed directly to the patient. RECOMMENDATION: Screening mammogram in one year. (Code:SM-B-01Y) BI-RADS CATEGORY  1: Negative. Electronically Signed   By: Dina  Arceo M.D.   On: 06/13/2024 12:35     PHYSICAL EXAM  Temp:  [98.1 F (36.7 C)-98.9 F (37.2 C)] 98.6 F (37 C) (01/15 1346) Pulse Rate:  [74-104] 83 (01/15 1545) Resp:  [6-34] 13 (01/15 1545) BP: (114-225)/(77-131)  165/104 (01/15 1545) SpO2:  [95 %-100 %] 100 % (01/15 1545) Weight:  [81.6 kg] 81.6 kg (01/14 1923)  General - Well nourished, well developed, in no apparent distress.  Ophthalmologic - fundi not visualized due to noncooperation.  Cardiovascular - Regular rhythm and rate.  Mental Status -  Level of arousal and orientation to time, place, and person were intact. Language including expression, naming, repetition, comprehension was assessed and found intact. Fund of Knowledge was assessed and was intact.  Cranial Nerves II - XII - II - Visual field intact OU. III, IV, VI - Extraocular movements intact. V - Facial sensation decreased on the left, chronic VII - L facial droop, chronic VIII - Hearing & vestibular intact bilaterally. X - Palate elevates symmetrically. XI - Chin turning & shoulder shrug intact bilaterally. XII - Tongue protrusion intact.  Motor Strength - The patients strength was normal in RUE and RLE, on LUE no significant drift and LLE 4+/5.  Bulk was normal and fasciculations were absent.   Motor Tone - Muscle tone  was assessed at the neck and appendages and was normal.  Reflexes - The patients reflexes were symmetrical in all extremities and she had no pathological reflexes.  Sensory - Light touch, temperature/pinprick were assessed and were decreased on the left, chronic  Coordination - The patient had normal movements in the R hand and feet with no ataxia or dysmetria but left UE FTN ataxic.  Tremor was absent.  Gait and Station - deferred.   ASSESSMENT/PLAN [name] is a 54 y.o. female with history of HTN, ESRD on HD, hx of stroke with left sided residue admitted for blurry vision, HA, slurry speech and worsening left sided numbness and weakness. TNK given.    Acute R subcortical stroke vs recrudescence from old stroke  CT no acute finding CTA head and neck diffuse tortuosity of major arteries, suggesting chronic underlying hypertension MRI   pending 2D Echo  EF 60-65%, no PFO LDL 44 HgbA1c <4.2 SCDs for VTE prophylaxis No antithrombotic (not taking ASA as prescribed) prior to admission, now on No antithrombotic within 24h of TNK.  Patient counseled to be compliant with her antithrombotic medications Ongoing aggressive stroke risk factor management Therapy recommendations:  pending Disposition:  pending, likely back to current nursing facility  Hypertensive emergency Unstable on presentation  Received labetalol  and now on cleviprex  Resume home BP meds Taper off cleviprex  as able BP goal now < 180/105 Long term BP goal normotensive  Hyperlipidemia Home meds:  lipitor 40  LDL 44, goal < 70 Now on lipitor 40 Continue statin at discharge  Other Stroke Risk Factors Obesity, Body mass index is 31.89 kg/m.  Hx stroke x 2 with residue left sided weakness, left facial droop ESRD on HD  Other Active Problems   Hospital day # 1  This patient is critically ill due to stroke vs. Recrudescence s/p TNK and at significant risk of neurological worsening, death form recurrent stroke, hemorrhagic conversion, bleeding from TNK. This patient's care requires constant monitoring of vital signs, hemodynamics, respiratory and cardiac monitoring, review of multiple databases, neurological assessment, discussion with family, other specialists and medical decision making of high complexity. I spent 35 minutes of neurocritical care time in the care of this patient. I discussed with CCM MD.   Ary Cummins, MD PhD Stroke Neurology 06/16/2024 3:55 PM    To contact Stroke Continuity provider, please refer to [email or web address]. After hours, contact General Neurology

## 2024-06-16 NOTE — ED Notes (Signed)
 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 Alert, NAD, calm, interactive, resps e/u, speaking in clear complete sentences, skin W&D.

## 2024-06-16 NOTE — Progress Notes (Signed)
 SLP Cancellation Note  Patient Details Name: Allison Hill MRN: [id number] DOB: 04-14-1971   Cancelled treatment:       Reason Eval/Treat Not Completed: Other (comment) Chart reviewed. TNK administered 1/14 @ 2017. Pt passed nursing swallow screen- now on a regular solids and thin liquids diet.   Plan to follow up with cognitive linguistic assessment next date as pt is available to allow time for medical intervention and stabilization.   Savannah Morford Clapp, MS, CCC-SLP Speech Language Pathologist Rehab Services; [facility] [phone] (ascom)     Shameer Molstad J Clapp 06/16/2024, 11:25 AM

## 2024-06-16 NOTE — Progress Notes (Signed)
 Patient well-known to us  from multiple prior admissions as well as following her for home hemodialysis at [facility].  She normally dialyzes on Monday, Tuesday, Wednesday, and Friday dialysis schedule.  No urgent indication for dialysis today.  She is currently on clevidipine  drip.  We will plan for hemodialysis treatment again tomorrow.

## 2024-06-16 NOTE — ED Notes (Signed)
 Report received. Pt on Q4hr [facility] checks post TNK. Care assumed.

## 2024-06-16 NOTE — Progress Notes (Signed)
 "  NAME:  Allison Hill, MRN:  [id number], DOB:  03-Nov-1970, LOS: 1 ADMISSION DATE:  06/15/2024, CONSULTATION DATE:  06/15/24 REFERRING MD:  Dr. Floy, CHIEF COMPLAINT:  Headache   History of Present Illness:  Allison Hill is a 54 year old female with a medical history significant for ESRD on hemodialysis, hypertension, a previous stroke and congestive heart failure who presented to [facility] ED for evaluation of an acute onset headache with slurred speech and vision changes. A code stroke was called upon arrival.   History is gathered per chart review and evaluation at bedside The patient reported that she received her normal dialysis session completed at noon and later around 6:30pm developed a severe headache with acute onset. At that time she also noticed blurred vision with slurred speech. She denied any nausea or vomiting. Upon EMS arrival, her glucose was 99 with a blood pressure of 179/100s. Per patient, she is supposed to take aspirin  at home but does not. She denied recent surgery or bleeding of any kind.   ED course: Upon arrival to the ED, she was found to have left sided facial droop, left sided extremity weakness and LUE ataxia. Initial NIHSS was 9. Neurology was consulted for evaluation.  A CT Head Code Stroke was completed. Due to hypertension, she received 40mg  of IV labetalol  and started on a cleviprex  infusion prior to TNK administration. She was later taken for a Cta of the head and neck.   Vitals on Arrival: temp: 98.9, BP 179/115, HR 94, RR 18, SpO2 99 on room air  Pertinent Labs/Diagnostics: Upon arrival she was found to have very mild hypokalemia with acute on chronic renal failure. Other lab results unremarkable  I, Robet Kim, PA-C personally viewed and interpreted this ECG. EKG Interpretation: Date: 06/16/24, EKG Time: 0046, Rate: 87, Rhythm: sinus rhythm, QRS Axis:  lvh with RAE, anterior Q waves Intervals: prolonged pr interval, prolonged QT interval ST/T Wave  abnormalities: no acute ST elevation, Narrative Interpretation: abnormal ECG with prolonged Qtc, RAE and LVH  Chemistry: Na+:141, K+: 3.4, BUN/Cr.: 37/4.86, Serum CO2/ AG: 27/15, AlkPhos 384, glucose 100  Hematology: WBC: 8.1, Hgb: 12.2, Platelets 179, aPTT: 43  CT Head Code Stroke Impression:  1. No acute intracranial abnormality. 2. ASPECTS is 10. 3. Age-related cerebral atrophy with moderate chronic microvascular ischemic disease, with multiple remote lacunar infarcts about the deep gray nuclei.  Cta Head/Neck Code Stroke Impression: IMPRESSION: 1. Negative CTA for large vessel occlusion or other emergent finding. 2. Mild atheromatous change about the carotid siphons without hemodynamically significant stenosis. 3. Diffuse tortuosity of the major arterial vasculature of the head and neck, suggesting chronic underlying hypertension.  Medications Administered: labetalol  40mg  + cleviprex  gtt, TNK, fentanyl  50mcg   PCCM consulted for admission due to post TNK administration.   Pertinent  Medical History  Stroke Hypertension Congestive Heart Failure ESRD on Hemodialysis History of Cocaine Abuse Chronic Thrombocytopenia  Significant Hospital Events: Including procedures, antibiotic start and stop dates in addition to other pertinent events   06/15/14: Presented to [facility] with acute headache with slurred speech and vision changes. Code stroke called, CT Head completed, given labetalol  and started on a cleviprex  gtt prior to TNK administration. 06/16/24- patient clinically doing well, she had TTE today and it looked reassuring to me.  She has repeat neuroimaging tommorow.  She has HD tommorow.   Interim History / Subjective:  Post TNK administration she is HD stable, with decreasing cleviprex  requirements. She reports a persistent headache that has been  treated with pain medication. The headache is not new, has not changed from previous and has no new neurologic deficits  Objective     Blood pressure (!) 149/91, pulse 85, temperature 98.1 F (36.7 C), temperature source Oral, resp. rate (!) 21, height 5' 3 (1.6 m), weight 81.6 kg, SpO2 99%.        Intake/Output Summary (Last 24 hours) at 06/16/2024 0848 Last data filed at 06/16/2024 0554 Gross per 24 hour  Intake 281.15 ml  Output --  Net 281.15 ml   Filed Weights   06/15/24 1916 06/15/24 1923  Weight: 81.6 kg 81.6 kg    Examination: General: Adult female acutely ill, lying in bed in NAD HENT: anicteric sclera, atraumatic, normocephalic, neck supple, no JVD CV: RRR, S1 S2, NSR on monitor, no r/m/g Pulm: Regular, non labored on room air , breath sounds equal throughout GI: soft, non-tender, non-distended, no rebound/guarding, bs x 4 Extremities: warm/dry, pulses + 2 R/P, no edema noted Neuro: A&O x 4, follows commands, right sided facial droop with LLE weakness and mild LUE ataxia, diminished sensation on LLE, PERRL GU: deferred   Resolved problem list   Assessment and Plan   #Suspected Stroke ~ Ruled out 06/15/24 CTH: no acute intracranial abnormality; age related cerebral atrophy with moderate chronic microvascular disease with multiple remote lacunar infarcts 06/15/24 CT angio head/neck/perfusion: Negative CTA for large vessel occlusion or other emergent finding. Mild atheromatous change about the carotid siphons without hemodynamically significant stenosis. Diffuse tortuosity of the major arterial vasculature of the head and neck, suggesting chronic underlying hypertension. - NIHSS q18min x 2 hours, then q x 6 h, then q1h x 16h followed by q4h until discharge - Bedside swallow > SLP eval if needed - Follow up MRI at 24 hours  - Echocardiogram ordered - Lipid panel wnl - Hgb A1c ordered - VTE prophylaxis: SCDs - No antiplatelets or anticoagulants for initial 24 hours - Not on [facility] prior to admission - PT/OT consult - STAT CT head if acute neuro changes develop - Ensure euglycemia - Avoid  hyperthermia; tylenol  prn - Neurology following, appreciate input  #Hypertension - Continuous cardiac monitoring - Ensure MAP >65 ~ not requiring vasopressors - BP goal <180/105 - Continue cleviprex  gtt - Wean as able - Once off of cleviprex  gtt, will add in prn antihypertensives - ECHO ordered  #Hypokalemia #ESRD on HD Creatinine on admission 4.86 - Trend BMP - Avoid nephrotoxins as able - Ensure adequate renal perfusion - Electrolyte replacement as indicated - Received K replacement overnight  #Transaminitis - Monitor hepatic function - RUQ US : negative for acute abnormality - Constipation protocol as indicated - NPO for now   Labs   CBC: Recent Labs  Lab 06/15/24 1943 06/16/24 0304  WBC 8.1 6.6  NEUTROABS 5.7  --   HGB 12.2 11.3*  HCT 38.3 35.7*  MCV 81.5 81.9  PLT 179 160    Basic Metabolic Panel: Recent Labs  Lab 06/15/24 1943 06/16/24 0304  NA 141 140  K 3.4* 3.4*  CL 99 101  CO2 27 26  GLUCOSE 100* 85  BUN 37* 38*  CREATININE 4.86* 5.18*  CALCIUM  8.5* 8.1*  MG  --  2.4  PHOS  --  2.9   GFR: Estimated Creatinine Clearance: 12.9 mL/min (A) (by C-G formula based on SCr of 5.18 mg/dL (H)). Recent Labs  Lab 06/15/24 1943 06/16/24 0304  WBC 8.1 6.6    Liver Function Tests: Recent Labs  Lab 06/15/24 1943 06/16/24  0304  AST 22 17  ALT 6 <5  ALKPHOS 384* 347*  BILITOT 0.4 0.4  PROT 8.2* 7.3  ALBUMIN 4.8 4.2   No results for input(s): LIPASE, AMYLASE in the last 168 hours. No results for input(s): AMMONIA in the last 168 hours.  ABG No results found for: PHART, PCO2ART, PO2ART, HCO3, TCO2, ACIDBASEDEF, O2SAT   Coagulation Profile: Recent Labs  Lab 06/15/24 1943  INR 0.9    Cardiac Enzymes: No results for input(s): CKTOTAL, CKMB, CKMBINDEX, TROPONINI in the last 168 hours.  HbA1C: Hgb A1c MFr Bld  Date/Time Value Ref Range Status  03/07/2024 08:24 PM 5.8 (H) 4.8 - 5.6 % Final    Comment:     (NOTE) Diagnosis of Diabetes The following HbA1c ranges recommended by the American Diabetes Association (ADA) may be used as an aid in the diagnosis of diabetes mellitus.  Hemoglobin             Suggested A1C NGSP%              Diagnosis  <5.7                   Non Diabetic  5.7-6.4                Pre-Diabetic  >6.4                   Diabetic  <7.0                   Glycemic control for                       adults with diabetes.    09/10/2023 09:35 AM 4.4 (L) 4.8 - 5.6 % Final    Comment:    (NOTE)         Prediabetes: 5.7 - 6.4         Diabetes: >6.4         Glycemic control for adults with diabetes: <7.0     CBG: Recent Labs  Lab 06/15/24 1927  GLUCAP 99    Review of Systems:   Endorses headache without dizziness, vision changes, nausea/vomiting, chest pain or shortness of breath.   Past Medical History:  She,  has a past medical history of CHF (congestive heart failure) (HCC), Hypertension, Renal disorder, and Stroke (HCC).   Surgical History:  History reviewed. No pertinent surgical history.   Social History:   reports that she has quit smoking. Her smoking use included cigarettes. She has never used smokeless tobacco. She reports that she does not currently use alcohol. She reports current drug use. Drug: Codeine.   Family History:  Her family history includes CAD in her brother; Heart failure in her mother.   Allergies Allergies[1]   Home Medications  Prior to Admission medications  Medication Sig Start Date End Date Taking? Authorizing Provider  acetaminophen  (TYLENOL ) 500 MG tablet Take 500 mg by mouth 3 (three) times daily.   Yes [provider]  amLODipine  (NORVASC ) 10 MG tablet Take 1 tablet (10 mg total) by mouth daily. 01/24/24 06/07/25 Yes Ray, Nilsa, MD  aspirin  EC 325 MG tablet Take 1 tablet (325 mg total) by mouth daily. 03/11/24  Yes Lenon Marien CROME, MD  atorvastatin  (LIPITOR) 40 MG tablet Take 40 mg by mouth every evening.  07/05/23  Yes [provider]  calcitRIOL  (ROCALTROL ) 0.25 MCG capsule Take 1 capsule (0.25 mcg total) by mouth 4 (four) times  a week. Take one capsule by mouth every Mon, Tue, Wed, Fri with dialysis Patient taking differently: Take 0.75 mcg by mouth See admin instructions. Administered at dialysis every Mon, Tue, Wed, and Fri. 12/10/23  Yes Dezii, Alexandra, DO  calcium  carbonate (TUMS - DOSED IN MG ELEMENTAL CALCIUM ) 500 MG chewable tablet Chew 2 tablets (400 mg of elemental calcium  total) by mouth 2 (two) times daily. Patient taking differently: Chew 400 mg of elemental calcium  by mouth 2 (two) times daily as needed for indigestion. 09/23/23  Yes Awanda City, MD  cinacalcet  (SENSIPAR ) 60 MG tablet Take 120 mg by mouth in the morning. 11/30/23  Yes [provider]  citalopram (CELEXA) 10 MG tablet Take 10 mg by mouth daily.   Yes [provider]  cloNIDine  (CATAPRES  - DOSED IN MG/24 HR) 0.3 mg/24hr patch Place 0.3 mg onto the skin every Saturday.   Yes [provider]  Dextran 70-Hypromellose (ARTIFICIAL TEARS PF OP) Apply to eye.   Yes [provider]  diclofenac  Sodium (VOLTAREN ) 1 % GEL Apply 2 g topically 2 (two) times daily as needed (shoulder pain.).   Yes [provider]  doxycycline (VIBRAMYCIN) 50 MG capsule Take 50 mg by mouth 2 (two) times daily. 06/13/24  Yes [provider]  fluticasone  (FLONASE ) 50 MCG/ACT nasal spray Place 1 spray into both nostrils daily as needed for allergies or rhinitis. 09/23/23  Yes Awanda City, MD  gabapentin  (NEURONTIN ) 100 MG capsule Take 200 mg by mouth 3 (three) times daily.   Yes [provider]  hydrALAZINE  (APRESOLINE ) 100 MG tablet Take 100 mg by mouth 3 (three) times daily. 05/19/24  Yes [provider]  indapamide (LOZOL) 1.25 MG tablet Take 1.25 mg by mouth daily at 12 noon. 05/23/24  Yes [provider]  irbesartan  (AVAPRO ) 300 MG tablet Take 1 tablet (300 mg total) by  mouth daily. 02/02/24  Yes Josette Ade, MD  isosorbide  mononitrate (IMDUR ) 120 MG 24 hr tablet Take 120 mg by mouth every evening.   Yes [provider]  labetalol  (NORMODYNE ) 300 MG tablet Take 300 mg by mouth 2 (two) times daily. 06/05/24  Yes [provider]  lidocaine  4 % Place 1 patch onto the skin at bedtime as needed (back pain.). Remove & Discard patch within 12 hours or as directed by MD. Apply to lower back in the night and remove at the morning.   Yes [provider]  melatonin 3 MG TABS tablet Take 3 mg by mouth at bedtime. 12/26/22  Yes [provider]  pantoprazole  (PROTONIX ) 40 MG tablet Take 1 tablet (40 mg total) by mouth daily. 09/24/23  Yes Awanda City, MD  polyethylene glycol (MIRALAX  / GLYCOLAX ) 17 g packet Take 17 g by mouth in the morning. Scheduled   Yes [provider]  sennosides-docusate sodium  (SENOKOT-S) 8.6-50 MG tablet Take 1 tablet by mouth daily.   Yes [provider]  sevelamer  carbonate (RENVELA ) 800 MG tablet Take 1,600 mg by mouth 3 (three) times daily with meals.   Yes [provider]  traZODone  (DESYREL ) 50 MG tablet Take 50 mg by mouth at bedtime. 03/03/24  Yes [provider]  epoetin  alfa (EPOGEN ) 4000 UNIT/ML injection Inject 1 mL (4,000 Units total) into the vein Every Tuesday,Thursday,and Saturday with dialysis. Patient taking differently: Inject 4,000 Units into the vein See admin instructions. Administer 1 ml into the vein every Monday, Tuesday, Wednesday and Friday. Given at dialysis 09/24/23   Awanda City, MD  polyethylene glycol (MIRALAX  / GLYCOLAX ) 17 g packet Take 17 g by mouth daily as needed. 09/23/23   Awanda City, MD     Critical care provider statement:   Total critical care time: 33 minutes   Performed by: Parris MD   Critical care time was exclusive of separately billable procedures and treating other patients.   Critical care was necessary to treat or prevent imminent or  life-threatening deterioration.   Critical care was time spent personally by me on the following activities: development of treatment plan with patient and/or surrogate as well as nursing, discussions with consultants, evaluation of patient's response to treatment, examination of patient, obtaining history from patient or surrogate, ordering and performing treatments and interventions, ordering and review of laboratory studies, ordering and review of radiographic studies, pulse oximetry and re-evaluation of patient's condition.    Cortlan Dolin, M.D.  Pulmonary & Critical Care Medicine            [1]  Allergies Allergen Reactions   Ceftriaxone Shortness Of Breath and Hives    Allergy, hives   Shellfish Allergy Hives   Minoxidil Swelling   "

## 2024-06-16 NOTE — Progress Notes (Signed)
 PHARMACY CONSULT NOTE - ELECTROLYTES  Pharmacy Consult for Electrolyte Monitoring and Replacement   Recent Labs: Height: 5' 3 (160 cm) Weight: 81.6 kg (180 lb) IBW/kg (Calculated) : 52.4 Estimated Creatinine Clearance: 12.9 mL/min (A) (by C-G formula based on SCr of 5.18 mg/dL (H)). Potassium (mmol/L)  Date Value  06/16/2024 3.4 (L)   Magnesium (mg/dL)  Date Value  98/84/7973 2.4   Calcium  (mg/dL)  Date Value  98/84/7973 8.1 (L)   Albumin (g/dL)  Date Value  98/84/7973 4.2   Phosphorus (mg/dL)  Date Value  98/84/7973 2.9   Sodium (mmol/L)  Date Value  06/16/2024 140   Assessment  Allison Hill is a 54 y.o. female presenting to ED for code stroke. PMH significant for HTN and ESRD on HD. Pharmacy has been consulted to monitor and replace electrolytes.  Diet: NPO until SLP clears MIVF: NS @ 40 mL/hr Pertinent medications: N/A  Goal of Therapy: Electrolytes WNL  Plan: Next HD session planned for 1/16. Likely will limit volume of fluid removed to avoid overly rapid BP lowering. K 3.4: potassium chloride  10mEq IV x2. Check BMP, Mg, Phos with AM labs  Thank you for allowing pharmacy to be a part of this patient's care.  Elsie CHRISTELLA Piety, PharmD Clinical Pharmacist 06/16/2024 8:54 AM

## 2024-06-16 NOTE — ED Notes (Signed)
 MRI scheduled for 1700, ativan  ordered. Pending echo. VSS. Alert, NAD, calm, interactive. HD usually MTW and F (4d).

## 2024-06-16 NOTE — ED Notes (Signed)
 ICU NP into room, at [facility]

## 2024-06-17 ENCOUNTER — Encounter: Admission: RE | Payer: Self-pay | Source: Home / Self Care

## 2024-06-17 ENCOUNTER — Ambulatory Visit: Admission: RE | Admit: 2024-06-17 | Source: Home / Self Care | Admitting: Vascular Surgery

## 2024-06-17 LAB — MAGNESIUM: Magnesium: 2.6 mg/dL — ABNORMAL HIGH (ref 1.7–2.4)

## 2024-06-17 LAB — CBC
HCT: 34.4 % — ABNORMAL LOW (ref 36.0–46.0)
Hemoglobin: 11.1 g/dL — ABNORMAL LOW (ref 12.0–15.0)
MCH: 26.2 pg (ref 26.0–34.0)
MCHC: 32.3 g/dL (ref 30.0–36.0)
MCV: 81.1 fL (ref 80.0–100.0)
Platelets: 173 K/uL (ref 150–400)
RBC: 4.24 MIL/uL (ref 3.87–5.11)
RDW: 18.5 % — ABNORMAL HIGH (ref 11.5–15.5)
WBC: 6 K/uL (ref 4.0–10.5)
nRBC: 0 % (ref 0.0–0.2)

## 2024-06-17 LAB — BASIC METABOLIC PANEL WITH GFR
Anion gap: 14 (ref 5–15)
BUN: 47 mg/dL — ABNORMAL HIGH (ref 6–20)
CO2: 23 mmol/L (ref 22–32)
Calcium: 8 mg/dL — ABNORMAL LOW (ref 8.9–10.3)
Chloride: 102 mmol/L (ref 98–111)
Creatinine, Ser: 6.46 mg/dL — ABNORMAL HIGH (ref 0.44–1.00)
GFR, Estimated: 7 mL/min — ABNORMAL LOW
Glucose, Bld: 114 mg/dL — ABNORMAL HIGH (ref 70–99)
Potassium: 5 mmol/L (ref 3.5–5.1)
Sodium: 138 mmol/L (ref 135–145)

## 2024-06-17 LAB — PHOSPHORUS: Phosphorus: 3.7 mg/dL (ref 2.5–4.6)

## 2024-06-17 SURGERY — INSERTION, GRAFT, ARTERIOVENOUS, UPPER EXTREMITY
Anesthesia: General | Laterality: Left

## 2024-06-17 MED ORDER — CLONIDINE HCL 0.1 MG PO TABS
0.1000 mg | ORAL_TABLET | Freq: Once | ORAL | Status: AC
  Administered 2024-06-17: 0.1 mg via ORAL
  Filled 2024-06-17: qty 1

## 2024-06-17 MED ORDER — CLONIDINE HCL 0.1 MG PO TABS: 0.2000 mg | ORAL_TABLET | Freq: Every day | ORAL | Status: DC

## 2024-06-17 MED ORDER — CLONIDINE HCL 0.1 MG PO TABS: 0.1000 mg | ORAL_TABLET | Freq: Every day | ORAL | Status: DC

## 2024-06-17 MED ORDER — HEPARIN SODIUM (PORCINE) 1000 UNIT/ML DIALYSIS
1000.0000 [IU] | INTRAMUSCULAR | Status: DC | PRN
  Administered 2024-06-20 (×4): 1000 [IU]
  Filled 2024-06-17 (×4): qty 1

## 2024-06-17 MED ORDER — MELATONIN 5 MG PO TABS
10.0000 mg | ORAL_TABLET | Freq: Every day | ORAL | Status: DC
  Administered 2024-06-17 – 2024-06-19 (×3): 10 mg via ORAL
  Filled 2024-06-17 (×3): qty 2

## 2024-06-17 MED ORDER — ASPIRIN 81 MG PO TBEC: 81.0000 mg | DELAYED_RELEASE_TABLET | Freq: Every day | ORAL | 12 refills | Status: AC

## 2024-06-17 MED ORDER — HYDRALAZINE HCL 50 MG PO TABS
100.0000 mg | ORAL_TABLET | Freq: Four times a day (QID) | ORAL | Status: DC
  Administered 2024-06-17 – 2024-06-20 (×13): 100 mg via ORAL
  Filled 2024-06-17 (×13): qty 2

## 2024-06-17 MED ORDER — HEPARIN SODIUM (PORCINE) 1000 UNIT/ML IJ SOLN
INTRAMUSCULAR | Status: AC
  Filled 2024-06-17: qty 5

## 2024-06-17 MED ORDER — ALTEPLASE 2 MG IJ SOLR: 2.0000 mg | Freq: Once | INTRAMUSCULAR | Status: DC | PRN

## 2024-06-17 MED ORDER — TRAZODONE HCL 100 MG PO TABS
100.0000 mg | ORAL_TABLET | Freq: Every day | ORAL | Status: DC
  Administered 2024-06-17 – 2024-06-19 (×3): 100 mg via ORAL
  Filled 2024-06-17 (×3): qty 1

## 2024-06-17 NOTE — ED Notes (Signed)
 This tech answered pt room and toilet call light. Pt found on toilet, but both side rails were up. After helping pt back to the stretcher, I reconnected her to the monitor, repositioned her, and tidied up the room as it appeared messy. No further requests from pt at this time. Fall bundle in place. Vitals updated.

## 2024-06-17 NOTE — Progress Notes (Signed)
 OT Cancellation Note  Patient Details Name: Allison Hill MRN: [id number] DOB: 1970-11-01   Cancelled Treatment:    Reason Eval/Treat Not Completed: Active bedrest order;Patient at procedure or test/ unavailable. Pt currently off the unit for HD. Noted active bedrest order; secure chat to MD. OT will re-attempt as appropriate.   Jarel Cuadra L. Wendee Hata, OTR/L  06/17/24, 9:41 AM

## 2024-06-17 NOTE — Progress Notes (Signed)
 PT Cancellation Note  Patient Details Name: Allison Hill MRN: [id number] DOB: August 16, 1970   Cancelled Treatment:    Reason Eval/Treat Not Completed: Patient at procedure or test/unavailable Orders received, chart reviewed. Patient off unit for HD. Continues to have active bedrest order. OT secure chat sent to MD. PT will re-attempt as appropriate.   Allison Hill, PT, DPT Physical Therapist - [place]  [facility]   Allison Hill 06/17/2024, 10:03 AM

## 2024-06-17 NOTE — Progress Notes (Signed)
 SLP Cancellation Note  Patient Details Name: Allison Hill MRN: [id number] DOB: 09-Sep-1970   Cancelled treatment:       Reason Eval/Treat Not Completed: SLP screened, no needs identified, will sign off   Quayshawn Nin 06/17/2024, 9:11 AM

## 2024-06-17 NOTE — Progress Notes (Signed)
 "  NAME:  Allison Hill, MRN:  [id number], DOB:  [birth year], LOS: 2 ADMISSION DATE:  06/15/2024, CONSULTATION DATE:  06/15/24 REFERRING MD:  Dr. Floy, CHIEF COMPLAINT:  Headache   History of Present Illness:  Allison Hill is a 54 year old female with a medical history significant for ESRD on hemodialysis, hypertension, a previous stroke and congestive heart failure who presented to [facility] ED for evaluation of an acute onset headache with slurred speech and vision changes. A code stroke was called upon arrival.   History is gathered per chart review and evaluation at bedside The patient reported that she received her normal dialysis session completed at noon and later around 6:30pm developed a severe headache with acute onset. At that time she also noticed blurred vision with slurred speech. She denied any nausea or vomiting. Upon EMS arrival, her glucose was 99 with a blood pressure of 179/100s. Per patient, she is supposed to take aspirin  at home but does not. She denied recent surgery or bleeding of any kind.   ED course: Upon arrival to the ED, she was found to have left sided facial droop, left sided extremity weakness and LUE ataxia. Initial NIHSS was 9. Neurology was consulted for evaluation.  A CT Head Code Stroke was completed. Due to hypertension, she received 40mg  of IV labetalol  and started on a cleviprex  infusion prior to TNK administration. She was later taken for a Cta of the head and neck.   Vitals on Arrival: temp: 98.9, BP 179/115, HR 94, RR 18, SpO2 99 on room air  Pertinent Labs/Diagnostics: Upon arrival she was found to have very mild hypokalemia with acute on chronic renal failure. Other lab results unremarkable  I, Robet Kim, PA-C personally viewed and interpreted this ECG. EKG Interpretation: Date: 06/16/24, EKG Time: 0046, Rate: 87, Rhythm: sinus rhythm, QRS Axis:  lvh with RAE, anterior Q waves Intervals: prolonged pr interval, prolonged QT interval ST/T Wave  abnormalities: no acute ST elevation, Narrative Interpretation: abnormal ECG with prolonged Qtc, RAE and LVH  Chemistry: Na+:141, K+: 3.4, BUN/Cr.: 37/4.86, Serum CO2/ AG: 27/15, AlkPhos 384, glucose 100  Hematology: WBC: 8.1, Hgb: 12.2, Platelets 179, aPTT: 43  CT Head Code Stroke Impression:  1. No acute intracranial abnormality. 2. ASPECTS is 10. 3. Age-related cerebral atrophy with moderate chronic microvascular ischemic disease, with multiple remote lacunar infarcts about the deep gray nuclei.  Cta Head/Neck Code Stroke Impression: IMPRESSION: 1. Negative CTA for large vessel occlusion or other emergent finding. 2. Mild atheromatous change about the carotid siphons without hemodynamically significant stenosis. 3. Diffuse tortuosity of the major arterial vasculature of the head and neck, suggesting chronic underlying hypertension.  Medications Administered: labetalol  40mg  + cleviprex  gtt, TNK, fentanyl  50mcg   PCCM consulted for admission due to post TNK administration.   Pertinent  Medical History  Stroke Hypertension Congestive Heart Failure ESRD on Hemodialysis History of Cocaine Abuse Chronic Thrombocytopenia  Significant Hospital Events: Including procedures, antibiotic start and stop dates in addition to other pertinent events   06/15/14: Presented to [facility] with acute headache with slurred speech and vision changes. Code stroke called, CT Head completed, given labetalol  and started on a cleviprex  gtt prior to TNK administration. 06/16/24- patient clinically doing well, she had TTE today and it looked reassuring to me.  She has repeat neuroimaging tommorow.  She has HD tommorow.  06/17/24- with accelerated HTN this am on exam with anasarca.  Plan to perform dialysis and then hopefully dc home  Interim History / Subjective:  Post TNK administration she is HD stable, with decreasing cleviprex  requirements. She reports a persistent headache that has been treated with pain  medication. The headache is not new, has not changed from previous and has no new neurologic deficits  Objective    Blood pressure (!) 172/104, pulse 82, temperature 97.8 F (36.6 C), resp. rate 16, height 5' 3 (1.6 m), weight 81.6 kg, SpO2 100%.        Intake/Output Summary (Last 24 hours) at 06/17/2024 0820 Last data filed at 06/16/2024 1318 Gross per 24 hour  Intake 1500 ml  Output --  Net 1500 ml   Filed Weights   06/15/24 1916 06/15/24 1923  Weight: 81.6 kg 81.6 kg    Examination: General: Adult female acutely ill, lying in bed in NAD HENT: anicteric sclera, atraumatic, normocephalic, neck supple, no JVD CV: RRR, S1 S2, NSR on monitor, no r/m/g Pulm: Regular, non labored on room air , breath sounds equal throughout GI: soft, non-tender, non-distended, no rebound/guarding, bs x 4 Extremities: warm/dry, pulses + 2 R/P, no edema noted Neuro: A&O x 4, follows commands, right sided facial droop with LLE weakness and mild LUE ataxia, diminished sensation on LLE, PERRL GU: deferred   Resolved problem list   Assessment and Plan   #Suspected Stroke ~ Ruled out 06/15/24 CTH: no acute intracranial abnormality; age related cerebral atrophy with moderate chronic microvascular disease with multiple remote lacunar infarcts 06/15/24 CT angio head/neck/perfusion: Negative CTA for large vessel occlusion or other emergent finding. Mild atheromatous change about the carotid siphons without hemodynamically significant stenosis. Diffuse tortuosity of the major arterial vasculature of the head and neck, suggesting chronic underlying hypertension. - NIHSS q20min x 2 hours, then q x 6 h, then q1h x 16h followed by q4h until discharge - Bedside swallow > SLP eval if needed - Follow up MRI at 24 hours  - Echocardiogram ordered - Lipid panel wnl - Hgb A1c ordered - VTE prophylaxis: SCDs - No antiplatelets or anticoagulants for initial 24 hours - Not on [facility] prior to admission - PT/OT  consult - STAT CT head if acute neuro changes develop - Ensure euglycemia - Avoid hyperthermia; tylenol  prn - Neurology following, appreciate input  #Hypertension - Continuous cardiac monitoring - Ensure MAP >65 ~ not requiring vasopressors - BP goal <180/105 - Continue cleviprex  gtt - Wean as able - Once off of cleviprex  gtt, will add in prn antihypertensives - ECHO ordered  #Hypokalemia #ESRD on HD Creatinine on admission 4.86 - Trend BMP - Avoid nephrotoxins as able - Ensure adequate renal perfusion - Electrolyte replacement as indicated - Received K replacement overnight  #Transaminitis - Monitor hepatic function - RUQ US : negative for acute abnormality - Constipation protocol as indicated - NPO for now   Labs   CBC: Recent Labs  Lab 06/15/24 1943 06/16/24 0304  WBC 8.1 6.6  NEUTROABS 5.7  --   HGB 12.2 11.3*  HCT 38.3 35.7*  MCV 81.5 81.9  PLT 179 160    Basic Metabolic Panel: Recent Labs  Lab 06/15/24 1943 06/16/24 0304 06/17/24 0451  NA 141 140 138  K 3.4* 3.4* 5.0  CL 99 101 102  CO2 27 26 23   GLUCOSE 100* 85 114*  BUN 37* 38* 47*  CREATININE 4.86* 5.18* 6.46*  CALCIUM  8.5* 8.1* 8.0*  MG  --  2.4 2.6*  PHOS  --  2.9 3.7   GFR: Estimated Creatinine Clearance: 10.3 mL/min (A) (by C-G formula based on SCr of 6.46  mg/dL (H)). Recent Labs  Lab 06/15/24 1943 06/16/24 0304  WBC 8.1 6.6    Liver Function Tests: Recent Labs  Lab 06/15/24 1943 06/16/24 0304  AST 22 17  ALT 6 <5  ALKPHOS 384* 347*  BILITOT 0.4 0.4  PROT 8.2* 7.3  ALBUMIN 4.8 4.2   No results for input(s): LIPASE, AMYLASE in the last 168 hours. No results for input(s): AMMONIA in the last 168 hours.  ABG No results found for: PHART, PCO2ART, PO2ART, HCO3, TCO2, ACIDBASEDEF, O2SAT   Coagulation Profile: Recent Labs  Lab 06/15/24 1943  INR 0.9    Cardiac Enzymes: No results for input(s): CKTOTAL, CKMB, CKMBINDEX, TROPONINI in the  last 168 hours.  HbA1C: Hgb A1c MFr Bld  Date/Time Value Ref Range Status  06/16/2024 03:04 AM <4.2 (L) 4.8 - 5.6 % Final  03/07/2024 08:24 PM 5.8 (H) 4.8 - 5.6 % Final    Comment:    (NOTE) Diagnosis of Diabetes The following HbA1c ranges recommended by the American Diabetes Association (ADA) may be used as an aid in the diagnosis of diabetes mellitus.  Hemoglobin             Suggested A1C NGSP%              Diagnosis  <5.7                   Non Diabetic  5.7-6.4                Pre-Diabetic  >6.4                   Diabetic  <7.0                   Glycemic control for                       adults with diabetes.      CBG: Recent Labs  Lab 06/15/24 1927  GLUCAP 99    Review of Systems:   Endorses headache without dizziness, vision changes, nausea/vomiting, chest pain or shortness of breath.   Past Medical History:  She,  has a past medical history of CHF (congestive heart failure) (HCC), Hypertension, Renal disorder, and Stroke (HCC).   Surgical History:  History reviewed. No pertinent surgical history.   Social History:   reports that she has quit smoking. Her smoking use included cigarettes. She has never used smokeless tobacco. She reports that she does not currently use alcohol. She reports current drug use. Drug: Codeine.   Family History:  Her family history includes CAD in her brother; Heart failure in her mother.   Allergies Allergies[1]   Home Medications  Prior to Admission medications  Medication Sig Start Date End Date Taking? Authorizing Provider  acetaminophen  (TYLENOL ) 500 MG tablet Take 500 mg by mouth 3 (three) times daily.   Yes [provider]  amLODipine  (NORVASC ) 10 MG tablet Take 1 tablet (10 mg total) by mouth daily. 01/24/24 06/07/25 Yes Ray, Nilsa, MD  aspirin  EC 325 MG tablet Take 1 tablet (325 mg total) by mouth daily. 03/11/24  Yes Lenon Marien CROME, MD  atorvastatin  (LIPITOR) 40 MG tablet Take 40 mg by mouth every evening.  07/05/23  Yes [provider]  calcitRIOL  (ROCALTROL ) 0.25 MCG capsule Take 1 capsule (0.25 mcg total) by mouth 4 (four) times a week. Take one capsule by mouth every Mon, Tue, Wed, Fri with dialysis Patient taking differently: Take 0.75  mcg by mouth See admin instructions. Administered at dialysis every Mon, Tue, Wed, and Fri. 12/10/23  Yes Dezii, Alexandra, DO  calcium  carbonate (TUMS - DOSED IN MG ELEMENTAL CALCIUM ) 500 MG chewable tablet Chew 2 tablets (400 mg of elemental calcium  total) by mouth 2 (two) times daily. Patient taking differently: Chew 400 mg of elemental calcium  by mouth 2 (two) times daily as needed for indigestion. 09/23/23  Yes Awanda City, MD  cinacalcet  (SENSIPAR ) 60 MG tablet Take 120 mg by mouth in the morning. 11/30/23  Yes [provider]  citalopram  (CELEXA ) 10 MG tablet Take 10 mg by mouth daily.   Yes [provider]  cloNIDine  (CATAPRES  - DOSED IN MG/24 HR) 0.3 mg/24hr patch Place 0.3 mg onto the skin every Saturday.   Yes [provider]  Dextran 70-Hypromellose (ARTIFICIAL TEARS PF OP) Apply to eye.   Yes [provider]  diclofenac  Sodium (VOLTAREN ) 1 % GEL Apply 2 g topically 2 (two) times daily as needed (shoulder pain.).   Yes [provider]  doxycycline (VIBRAMYCIN) 50 MG capsule Take 50 mg by mouth 2 (two) times daily. 06/13/24  Yes [provider]  fluticasone  (FLONASE ) 50 MCG/ACT nasal spray Place 1 spray into both nostrils daily as needed for allergies or rhinitis. 09/23/23  Yes Awanda City, MD  gabapentin  (NEURONTIN ) 100 MG capsule Take 200 mg by mouth 3 (three) times daily.   Yes [provider]  hydrALAZINE  (APRESOLINE ) 100 MG tablet Take 100 mg by mouth 3 (three) times daily. 05/19/24  Yes [provider]  indapamide (LOZOL) 1.25 MG tablet Take 1.25 mg by mouth daily at 12 noon. 05/23/24  Yes [provider]  irbesartan  (AVAPRO ) 300 MG tablet Take 1 tablet (300 mg total) by  mouth daily. 02/02/24  Yes Wieting, Richard, MD  isosorbide  mononitrate (IMDUR ) 120 MG 24 hr tablet Take 120 mg by mouth every evening.   Yes [provider]  labetalol  (NORMODYNE ) 300 MG tablet Take 300 mg by mouth 2 (two) times daily. 06/05/24  Yes [provider]  lidocaine  4 % Place 1 patch onto the skin at bedtime as needed (back pain.). Remove & Discard patch within 12 hours or as directed by MD. Apply to lower back in the night and remove at the morning.   Yes [provider]  melatonin 3 MG TABS tablet Take 3 mg by mouth at bedtime. 12/26/22  Yes [provider]  pantoprazole  (PROTONIX ) 40 MG tablet Take 1 tablet (40 mg total) by mouth daily. 09/24/23  Yes Awanda City, MD  polyethylene glycol (MIRALAX  / GLYCOLAX ) 17 g packet Take 17 g by mouth in the morning. Scheduled   Yes [provider]  sennosides-docusate sodium  (SENOKOT-S) 8.6-50 MG tablet Take 1 tablet by mouth daily.   Yes [provider]  sevelamer  carbonate (RENVELA ) 800 MG tablet Take 1,600 mg by mouth 3 (three) times daily with meals.   Yes [provider]  traZODone  (DESYREL ) 50 MG tablet Take 50 mg by mouth at bedtime. 03/03/24  Yes [provider]  epoetin  alfa (EPOGEN ) 4000 UNIT/ML injection Inject 1 mL (4,000 Units total) into the vein Every Tuesday,Thursday,and Saturday with dialysis. Patient taking differently: Inject 4,000 Units into the vein See admin instructions. Administer 1 ml into the vein every Monday, Tuesday, Wednesday and Friday. Given at dialysis 09/24/23   Awanda City, MD  polyethylene glycol (MIRALAX  / GLYCOLAX ) 17 g packet Take 17 g by mouth daily as needed. 09/23/23  Awanda City, MD     Critical care provider statement:   Total critical care time: 33 minutes   Performed by: Parris MD   Critical care time was exclusive of separately billable procedures and treating other patients.   Critical care was necessary to treat or prevent imminent or  life-threatening deterioration.   Critical care was time spent personally by me on the following activities: development of treatment plan with patient and/or surrogate as well as nursing, discussions with consultants, evaluation of patient's response to treatment, examination of patient, obtaining history from patient or surrogate, ordering and performing treatments and interventions, ordering and review of laboratory studies, ordering and review of radiographic studies, pulse oximetry and re-evaluation of patient's condition.    Gilverto Dileonardo, M.D.  Pulmonary & Critical Care Medicine             [1]  Allergies Allergen Reactions   Ceftriaxone Shortness Of Breath and Hives    Allergy, hives   Shellfish Allergy Hives   Minoxidil Swelling   "

## 2024-06-17 NOTE — ED Notes (Signed)
 Patient transported to dialysis.

## 2024-06-17 NOTE — Progress Notes (Signed)
" °   06/17/24 1314  Vitals  Temp 97.8 F (36.6 C)  Temp Source Oral  BP (!) 155/110  MAP (mmHg) 124  Pulse Rate 92  ECG Heart Rate 93  Resp (!) 28  Oxygen Therapy  SpO2 100 %  O2 Device Room Air  Patient Activity (if Appropriate) In bed  Pulse Oximetry Type Continuous  Oximetry Probe Site Changed No  During Treatment Monitoring  Blood Flow Rate (mL/min) 280 mL/min  Arterial Pressure (mmHg) -124.03 mmHg  Venous Pressure (mmHg) 136.36 mmHg  TMP (mmHg) 25.05 mmHg  Ultrafiltration Rate (mL/min) 767 mL/min  Dialysate Flow Rate (mL/min) 299 ml/min  Dialysate Potassium Concentration 2  Dialysate Calcium  Concentration 2.5  Duration of HD Treatment -hour(s) 3.5 hour(s)  Cumulative Fluid Removed (mL) per Treatment  2000.12  HD Safety Checks Performed Yes  Intra-Hemodialysis Comments Tolerated well;Tx completed  Post Treatment  Dialyzer Clearance Lightly streaked  Hemodialysis Intake (mL) 0 mL  Liters Processed 75.8  Fluid Removed (mL) 2000 mL  Tolerated HD Treatment Yes  AVG/AVF Arterial Site Held (minutes) 0 minutes  AVG/AVF Venous Site Held (minutes) 0 minutes  Hemodialysis Catheter Right Subclavian  No placement date or time found.   Orientation: Right  Access Location: Subclavian  Site Condition No complications  Blue Lumen Status Flushed;Antimicrobial dead end cap;Heparin  locked  Red Lumen Status Flushed;Dead end cap in place;Heparin  locked  Purple Lumen Status N/A  Dressing Status Antimicrobial disc/dressing in place;Clean, Dry, Intact;Clean  Interventions New dressing  Dressing Change Due 06/24/24    "

## 2024-06-17 NOTE — Progress Notes (Signed)
 PHARMACY CONSULT NOTE - ELECTROLYTES  Pharmacy Consult for Electrolyte Monitoring and Replacement   Recent Labs: Height: 5' 3 (160 cm) Weight: 81.6 kg (180 lb) IBW/kg (Calculated) : 52.4 Estimated Creatinine Clearance: 10.3 mL/min (A) (by C-G formula based on SCr of 6.46 mg/dL (H)). Potassium (mmol/L)  Date Value  06/17/2024 5.0   Magnesium (mg/dL)  Date Value  98/83/7973 2.6 (H)   Calcium  (mg/dL)  Date Value  98/83/7973 8.0 (L)   Albumin (g/dL)  Date Value  98/84/7973 4.2   Phosphorus (mg/dL)  Date Value  98/83/7973 3.7   Sodium (mmol/L)  Date Value  06/17/2024 138   Assessment  Allison Hill is a 54 y.o. female presenting to ED for code stroke. PMH significant for HTN and ESRD on HD. Pharmacy has been consulted to monitor and replace electrolytes.  Diet: Renal, fluid restricted MIVF: n/a Pertinent medications: irbesartan  300mg  twice daily  Goal of Therapy: Electrolytes WNL  Plan: Next HD session planned for today. No electrolyte replacement warranted Check BMP with AM labs  Thank you for allowing pharmacy to be a part of this patient's care.  Will M. Lenon, PharmD, BCPS Clinical Pharmacist 06/17/2024 8:47 AM

## 2024-06-17 NOTE — Progress Notes (Signed)
 Tentative plan was for patient to be discharged home after HD today.  However post HD, BP again 180's/110's.  Discussed with pt that she is not ready medically for discharge, as BP is too elevated, and could result in same symptoms that she presented with.   Pt is not happy with news and considering leaving AMA.  However she ultimately decides she will stay another day, but likely will leave tomorrow regardless.    Plan to increase her Clonidine  and Hydralazine  PO doses.  Will make her Stepdown status in the event she were to need antihypertensive infusion.  Discussed with Dr. Aleskerov and he is in agreement.      Inge Lecher, AGACNP-BC [facility] Prefer epic messenger for cross cover needs If after hours, please call E-link

## 2024-06-17 NOTE — Progress Notes (Signed)
 " [facility]  ROUNDING NOTE   Subjective:   Allison Hill is a 54 y.o. female with past medical history of ESRD on HD, hypertension, congestive heart failure, migraine headaches, history of intracranial hemorrhage, cocaine abuse, sickle cell trait who was seen for Slurred speech [R47.81] Bad headache [R51.9] Stroke [facility]) [I63.9]  Patient is known to our practice and is a resident at [facility], where she receives her dialysis.  Her last treatment completed on Wednesday.  Patient was seen and evaluated during dialysis today.   HEMODIALYSIS FLOWSHEET:  Blood Flow Rate (mL/min): 280 mL/min Arterial Pressure (mmHg): -124.03 mmHg Venous Pressure (mmHg): 136.36 mmHg TMP (mmHg): 25.05 mmHg Ultrafiltration Rate (mL/min): 767 mL/min Dialysate Flow Rate (mL/min): 299 ml/min  Patient was brought in from her SNF for evaluation of blurred vision.  She states she had had blurred vision for a few hours.  Felt to would clear on its own. Code stroke called on ED arrival.   MRI brain negative for acute findings.  We have been consulted to manage dialysis needs.   Objective:  Vital signs in last 24 hours:  Temp:  [97.8 F (36.6 C)-98.8 F (37.1 C)] 97.8 F (36.6 C) (01/16 1314) Pulse Rate:  [77-93] 92 (01/16 1314) Resp:  [8-33] 28 (01/16 1314) BP: (138-193)/(95-123) 155/110 (01/16 1314) SpO2:  [99 %-100 %] 100 % (01/16 1314) Weight:  [78.1 kg] 78.1 kg (01/16 0917)  Weight change:  Filed Weights   06/15/24 1916 06/15/24 1923 06/17/24 0917  Weight: 81.6 kg 81.6 kg 78.1 kg    Intake/Output: I/O last 3 completed shifts: In: 1781.2 [I.V.:1385.1; IV Piggyback:396] Out: -    Intake/Output this shift:  Total I/O In: -  Out: 2000 [Other:2000]  Physical Exam: General: NAD  Head: Normocephalic, atraumatic. Moist oral mucosal membranes  Eyes: Anicteric  Lungs:  Clear to auscultation, normal effort  Heart: Regular rate and rhythm  Abdomen:  Soft, nontender   Extremities: Trace peripheral edema.  Neurologic: Awake, alert, conversant  Skin: Warm,dry, no rash  Access: Right IJ PermCath    Basic Metabolic Panel: Recent Labs  Lab 06/15/24 1943 06/16/24 0304 06/17/24 0451  NA 141 140 138  K 3.4* 3.4* 5.0  CL 99 101 102  CO2 27 26 23   GLUCOSE 100* 85 114*  BUN 37* 38* 47*  CREATININE 4.86* 5.18* 6.46*  CALCIUM  8.5* 8.1* 8.0*  MG  --  2.4 2.6*  PHOS  --  2.9 3.7    Liver Function Tests: Recent Labs  Lab 06/15/24 1943 06/16/24 0304  AST 22 17  ALT 6 <5  ALKPHOS 384* 347*  BILITOT 0.4 0.4  PROT 8.2* 7.3  ALBUMIN 4.8 4.2   No results for input(s): LIPASE, AMYLASE in the last 168 hours. No results for input(s): AMMONIA in the last 168 hours.  CBC: Recent Labs  Lab 06/15/24 1943 06/16/24 0304 06/17/24 0804  WBC 8.1 6.6 6.0  NEUTROABS 5.7  --   --   HGB 12.2 11.3* 11.1*  HCT 38.3 35.7* 34.4*  MCV 81.5 81.9 81.1  PLT 179 160 173    Cardiac Enzymes: No results for input(s): CKTOTAL, CKMB, CKMBINDEX, TROPONINI in the last 168 hours.  BNP: Invalid input(s): POCBNP  CBG: Recent Labs  Lab 06/15/24 1927  GLUCAP 99    Microbiology: Results for orders placed or performed during the hospital encounter of 06/15/24  MRSA Next Gen by PCR, Nasal     Status: None   Collection Time: 06/16/24 12:44 AM  Specimen: Nasal Mucosa; Nasal Swab  Result Value Ref Range Status   MRSA by PCR Next Gen NOT DETECTED NOT DETECTED Final    Comment: (NOTE) The GeneXpert MRSA Assay (FDA approved for NASAL specimens only), is one component of a comprehensive MRSA colonization surveillance program. It is not intended to diagnose MRSA infection nor to guide or monitor treatment for MRSA infections. Test performance is not FDA approved in patients less than 62 years old. Performed at [facility], [address] [place], [place] [address]     Coagulation Studies: Recent Labs    06/15/24 1943  LABPROT 12.7   INR 0.9    Urinalysis: No results for input(s): COLORURINE, LABSPEC, PHURINE, GLUCOSEU, HGBUR, BILIRUBINUR, KETONESUR, PROTEINUR, UROBILINOGEN, NITRITE, LEUKOCYTESUR in the last 72 hours.  Invalid input(s): APPERANCEUR    Imaging: MR BRAIN WO CONTRAST Result Date: 06/16/2024 EXAM: MRI BRAIN WITHOUT CONTRAST 06/16/2024 06:09:36 PM TECHNIQUE: Multiplanar multisequence MRI of the head/brain was performed without the administration of intravenous contrast. COMPARISON: Head CT and CTA 06/15/2024 and MRI 03/08/2024. CLINICAL HISTORY: Stroke, follow up. Headache, dizziness, and imbalance. History of stroke. FINDINGS: The examination is intermittently up to moderately motion degraded. BRAIN AND VENTRICLES: There is no evidence of an acute infarct, mass, midline shift, hydrocephalus, or extra-axial fluid collection. Numerous chronic microhemorrhages are again noted primarily involving the deep gray nuclei, brainstem, and cerebellum suggestive of chronic hypertension. Patchy to confluent T2 hyperintensities in the cerebral white matter bilaterally are similar to the prior MRI and nonspecific but compatible with severe chronic small vessel ischemic disease. Chronic lacunar infarcts are noted bilaterally in the deep cerebral white matter, basal ganglia, and thalami as well as pons. There are also unchanged small chronic cortical infarcts at the right temporooccipital junction and in the medial left parietal lobe. There is mild cerebral atrophy. A partially empty sella is noted. Major intracranial vascular flow voids are preserved. ORBITS: No significant abnormality. SINUSES AND MASTOIDS: Moderately large right mastoid effusion. Clear paranasal sinuses. BONES AND SOFT TISSUES: Heterogeneous calvarial bone marrow signal and diffusely diminished bone marrow T1 signal intensity in the cervical spine, similar to the prior MRI and nonspecific but may be related to end stage renal disease and  anemia. No soft tissue abnormality. IMPRESSION: 1. No acute intracranial abnormality. 2. Severe chronic small vessel ischemic disease with multiple chronic infarcts and chronic microhemorrhages. Electronically signed by: Dasie Hamburg MD 06/16/2024 06:55 PM EST RP Workstation: [id number]   ECHOCARDIOGRAM COMPLETE Result Date: 06/16/2024    ECHOCARDIOGRAM REPORT   Patient Name:   DASHANTI BURR Date of Exam: 06/16/2024 Medical Rec #:  [id number]        Height:       63.0 in Accession #:    [id number]       Weight:       180.0 lb Date of Birth:  02-25-1971        BSA:          1.849 m Patient Age:    53 years         BP:           143/95 mmHg Patient Gender: F                HR:           85 bpm. Exam Location:  [facility] Procedure: 2D Echo, Cardiac Doppler, Color Doppler, 3D Echo, Saline Contrast            Bubble Study and Strain Analysis (Both  Spectral and Color Flow            Doppler were utilized during procedure). Indications:     Stroke I63.9  History:         Patient has prior history of Echocardiogram examinations, most                  recent 03/08/2024. CHF and Transplant Complications, Stroke;                  Risk Factors:Hypertension.  Sonographer:     Christopher Furnace Referring Phys:  [id number] ROBET KIM Diagnosing Phys: Evalene Lunger MD  Sonographer Comments: Global longitudinal strain was attempted. IMPRESSIONS  1. Left ventricular ejection fraction, by estimation, is 60 to 65%. The left ventricle has normal function. The left ventricle has no regional wall motion abnormalities. There is mild left ventricular hypertrophy. Left ventricular diastolic parameters are consistent with Grade I diastolic dysfunction (impaired relaxation).  2. Right ventricular systolic function is normal. The right ventricular size is normal. There is normal pulmonary artery systolic pressure. The estimated right ventricular systolic pressure is 25.4 mmHg.  3. The mitral valve is normal in structure. Mild mitral valve  regurgitation. No evidence of mitral stenosis.  4. The aortic valve is normal in structure. Aortic valve regurgitation is not visualized. No aortic stenosis is present.  5. The inferior vena cava is normal in size with greater than 50% respiratory variability, suggesting right atrial pressure of 3 mmHg.  6. Agitated saline contrast bubble study was negative, with no evidence of any interatrial shunt. FINDINGS  Left Ventricle: Left ventricular ejection fraction, by estimation, is 60 to 65%. The left ventricle has normal function. The left ventricle has no regional wall motion abnormalities. The global longitudinal strain is normal despite suboptimal segment tracking. The left ventricular internal cavity size was normal in size. There is mild left ventricular hypertrophy. Left ventricular diastolic parameters are consistent with Grade I diastolic dysfunction (impaired relaxation). Right Ventricle: The right ventricular size is normal. No increase in right ventricular wall thickness. Right ventricular systolic function is normal. There is normal pulmonary artery systolic pressure. The tricuspid regurgitant velocity is 2.26 m/s, and  with an assumed right atrial pressure of 5 mmHg, the estimated right ventricular systolic pressure is 25.4 mmHg. Left Atrium: Left atrial size was normal in size. Right Atrium: Right atrial size was normal in size. Pericardium: There is no evidence of pericardial effusion. Mitral Valve: The mitral valve is normal in structure. Mild mitral valve regurgitation. No evidence of mitral valve stenosis. MV peak gradient, 7.5 mmHg. The mean mitral valve gradient is 3.0 mmHg. Tricuspid Valve: The tricuspid valve is normal in structure. Tricuspid valve regurgitation is not demonstrated. No evidence of tricuspid stenosis. Aortic Valve: The aortic valve is normal in structure. Aortic valve regurgitation is not visualized. No aortic stenosis is present. Aortic valve mean gradient measures 9.0 mmHg.  Aortic valve peak gradient measures 16.5 mmHg. Aortic valve area, by VTI measures 1.75 cm. Pulmonic Valve: The pulmonic valve was normal in structure. Pulmonic valve regurgitation is not visualized. No evidence of pulmonic stenosis. Aorta: The aortic root is normal in size and structure. Venous: The inferior vena cava is normal in size with greater than 50% respiratory variability, suggesting right atrial pressure of 3 mmHg. IAS/Shunts: No atrial level shunt detected by color flow Doppler. Agitated saline contrast was given intravenously to evaluate for intracardiac shunting. Agitated saline contrast bubble study was negative, with no evidence of any interatrial  shunt. There  is no evidence of a patent foramen ovale. There is no evidence of an atrial septal defect. Additional Comments: 3D was performed not requiring image post processing on an independent workstation and was indeterminate.  LEFT VENTRICLE PLAX 2D LVIDd:         4.40 cm   Diastology LVIDs:         2.90 cm   LV e' medial:    6.31 cm/s LV PW:         1.70 cm   LV E/e' medial:  10.4 LV IVS:        1.40 cm   LV e' lateral:   3.15 cm/s LVOT diam:     2.10 cm   LV E/e' lateral: 20.8 LV SV:         62 LV SV Index:   33        2D Longitudinal Strain LVOT Area:     3.46 cm  2D Strain GLS Avg:     -9.4 % LV IVRT:       111 msec  RIGHT VENTRICLE RV Basal diam:  2.90 cm RV Mid diam:    2.40 cm RV S prime:     15.90 cm/s TAPSE (M-mode): 2.4 cm LEFT ATRIUM             Index        RIGHT ATRIUM           Index LA diam:        3.00 cm 1.62 cm/m   RA Area:     16.30 cm LA Vol (A2C):   65.3 ml 35.32 ml/m  RA Volume:   40.90 ml  22.12 ml/m LA Vol (A4C):   72.5 ml 39.21 ml/m LA Biplane Vol: 73.4 ml 39.70 ml/m  AORTIC VALVE AV Area (Vmax):    1.91 cm AV Area (Vmean):   1.81 cm AV Area (VTI):     1.75 cm AV Vmax:           203.33 cm/s AV Vmean:          136.333 cm/s AV VTI:            0.353 m AV Peak Grad:      16.5 mmHg AV Mean Grad:      9.0 mmHg LVOT Vmax:          112.00 cm/s LVOT Vmean:        71.400 cm/s LVOT VTI:          0.178 m LVOT/AV VTI ratio: 0.50  AORTA Ao Root diam: 3.70 cm MITRAL VALVE                TRICUSPID VALVE MV Area (PHT): 4.93 cm     TR Peak grad:   20.4 mmHg MV Area VTI:   3.02 cm     TR Vmax:        226.00 cm/s MV Peak grad:  7.5 mmHg MV Mean grad:  3.0 mmHg     SHUNTS MV Vmax:       1.37 m/s     Systemic VTI:  0.18 m MV Vmean:      87.0 cm/s    Systemic Diam: 2.10 cm MV Decel Time: 154 msec MV E velocity: 65.50 cm/s MV A velocity: 101.00 cm/s MV E/A ratio:  0.65 Evalene Lunger MD Electronically signed by Evalene Lunger MD Signature Date/Time: 06/16/2024/1:26:40 PM    Final    US  Abdomen Limited RUQ (LIVER/GB)  Result Date: 06/16/2024 CLINICAL DATA:  Elevated LFTs EXAM: ULTRASOUND ABDOMEN LIMITED RIGHT UPPER QUADRANT COMPARISON:  None Available. FINDINGS: Gallbladder: No gallstones or wall thickening visualized. No sonographic Murphy sign noted by sonographer. Common bile duct: Diameter: 4.9 mm. Liver: No focal lesion identified. Within normal limits in parenchymal echogenicity. Portal vein is patent on color Doppler imaging with normal direction of blood flow towards the liver. Other: Note is made of a small echogenic right kidney consistent with the patient's known history of end-stage renal disease. IMPRESSION: No acute abnormality in the right upper quadrant. Electronically Signed   By: Oneil Devonshire M.D.   On: 06/16/2024 02:33   CT ANGIO HEAD NECK W WO CM (CODE STROKE) Result Date: 06/15/2024 CLINICAL DATA:  Initial evaluation for acute neuro deficit, stroke. Blurry vision, headache. EXAM: CT ANGIOGRAPHY HEAD AND NECK WITH AND WITHOUT CONTRAST TECHNIQUE: Multidetector CT imaging of the head and neck was performed using the standard protocol during bolus administration of intravenous contrast. Multiplanar CT image reconstructions and MIPs were obtained to evaluate the vascular anatomy. Carotid stenosis measurements (when applicable) are  obtained utilizing NASCET criteria, using the distal internal carotid diameter as the denominator. RADIATION DOSE REDUCTION: This exam was performed according to the departmental dose-optimization program which includes automated exposure control, adjustment of the mA and/or kV according to patient size and/or use of iterative reconstruction technique. CONTRAST:  75mL OMNIPAQUE  IOHEXOL  350 MG/ML SOLN COMPARISON:  CT from earlier the same day as well as prior exam from 03/07/2024. FINDINGS: CTA NECK FINDINGS Aortic arch: Dense aortic arch within normal limits for caliber with standard branch pattern. Aortic atherosclerosis. No stenosis about the origin the great vessels. Right carotid system: Right common and internal carotid arteries are tortuous but patent without stenosis or dissection. Left carotid system: Left common and internal carotid arteries are tortuous but patent without stenosis or dissection. Vertebral arteries: Both vertebral arteries patent without stenosis or dissection. Left vertebral artery slightly dominant. Skeleton: Diffuse sclerosis throughout the visualized osseous structures, likely reflecting changes of renal osteodystrophy. No worrisome osseous lesions. Moderate spondylosis at C5-6. Other neck: No other acute finding. Right-sided central venous catheter in place. Upper chest: No other acute finding. Review of the MIP images confirms the above findings CTA HEAD FINDINGS Anterior circulation: Mild atheromatous change about the carotid siphons without hemodynamically significant stenosis. A1 segments patent bilaterally. Normal anterior communicating artery complex. Anterior cerebral arteries patent without significant stenosis. No M1 stenosis or occlusion. No proximal MCA branch occlusion or high-grade stenosis. Distal MCA branches perfused and symmetric. Posterior circulation: Mild diffuse dolichoectasia of the posterior circulation. Both V4 segments patent without stenosis. Left vertebral  artery dominant. Neither PICA origin well visualized. Basilar diffusely ectatic and patent without stenosis. Superior cerebellar and posterior cerebral arteries patent bilaterally. Venous sinuses: Patent allowing for timing the contrast bolus. Anatomic variants: None significant.  No aneurysm. Review of the MIP images confirms the above findings IMPRESSION: 1. Negative CTA for large vessel occlusion or other emergent finding. 2. Mild atheromatous change about the carotid siphons without hemodynamically significant stenosis. 3. Diffuse tortuosity of the major arterial vasculature of the head and neck, suggesting chronic underlying hypertension. Aortic Atherosclerosis (ICD10-I70.0). Electronically Signed   By: Morene Hoard M.D.   On: 06/15/2024 21:11   CT HEAD CODE STROKE WO CONTRAST Result Date: 06/15/2024 CLINICAL DATA:  Code stroke. Initial evaluation for acute neuro deficit, stroke. No other relevant history is provided. EXAM: CT HEAD WITHOUT CONTRAST TECHNIQUE: Contiguous axial images were obtained from  the base of the skull through the vertex without intravenous contrast. RADIATION DOSE REDUCTION: This exam was performed according to the departmental dose-optimization program which includes automated exposure control, adjustment of the mA and/or kV according to patient size and/or use of iterative reconstruction technique. COMPARISON:  Prior exam from 03/08/2024. FINDINGS: Brain: Age-related cerebral atrophy with moderate chronic microvascular ischemic disease. Multiple scattered remote lacunar infarcts present about the deep gray nuclei. No acute intracranial hemorrhage. No acute large vessel territory infarct. No mass lesion or midline shift. No hydrocephalus or extra-axial fluid collection. Vascular: No abnormal hyperdense vessel. Scattered vascular calcifications noted within the carotid siphons. Skull: Scalp soft tissues within normal limits.  Calvarium intact. Sinuses/Orbits: Globes and orbital  soft tissues demonstrate no acute finding. Visualized paranasal sinuses are clear. Small right mastoid effusion noted. Other: None. ASPECTS [facility] Stroke Program Early CT Score) - Ganglionic level infarction (caudate, lentiform nuclei, internal capsule, insula, M1-M3 cortex): 7 - Supraganglionic infarction (M4-M6 cortex): 3 Total score (0-10 with 10 being normal): 10 IMPRESSION: 1. No acute intracranial abnormality. 2. ASPECTS is 10. 3. Age-related cerebral atrophy with moderate chronic microvascular ischemic disease, with multiple remote lacunar infarcts about the deep gray nuclei. These results were communicated to Dr. Jerri at 7:49 pm on 06/15/2024 by text page via the [facility] messaging system. Electronically Signed   By: Morene Hoard M.D.   On: 06/15/2024 19:51     Medications:      stroke: early stages of recovery book   Does not apply Once   amLODipine   10 mg Oral Daily   aspirin  EC  81 mg Oral Daily   atorvastatin   40 mg Oral QPM   Chlorhexidine  Gluconate Cloth  6 each Topical Q0600   cloNIDine   0.1 mg Oral Daily   heparin  injection (subcutaneous)  5,000 Units Subcutaneous Q8H   hydrALAZINE   100 mg Oral TID   irbesartan   300 mg Oral Daily   isosorbide  mononitrate  120 mg Oral QPM   labetalol   300 mg Oral BID   pantoprazole   40 mg Oral Daily   acetaminophen  **OR** acetaminophen  (TYLENOL ) oral liquid 160 mg/5 mL **OR** acetaminophen , alteplase , heparin , hydrALAZINE , polyethylene glycol, senna  Assessment/ Plan:  Ms. Toula Miyasaki is a 54 y.o.  female with past medical history of ESRD on HD, hypertension, congestive heart failure, migraine headaches, history of intracranial hemorrhage, cocaine abuse, sickle cell trait who was seen for Slurred speech [R47.81] Bad headache [R51.9] Stroke [facility]) [I63.9]   Hypertensive emergency, multiple admissions for this concern.  Blood pressure on ED arrival 161/103.  Patient has extensive hypertensive medication list which includes amlodipine ,  clonidine , hydralazine , irbesartan , isosorbide , and labetalol .  I will continue during this admission.  2.  End-stage renal disease on hemodialysis.  Last treatment completed on Wednesday.  Patient receiving dialysis at this time, UF 1 to 1.5 L as tolerated.  Next treatment scheduled for Monday.  3. Anemia of chronic kidney disease Lab Results  Component Value Date   HGB 11.1 (L) 06/17/2024    Hemoglobin within optimal range.  No need for ESA's.  4. Secondary Hyperparathyroidism: with outpatient labs: None Lab Results  Component Value Date   PTH 1,248 (H) 08/11/2023   CALCIUM  8.0 (L) 06/17/2024   PHOS 3.7 06/17/2024    Calcium  and phosphorus within desired range for renal patient.   LOS: 2 Leonidus Rowand 1/16/20262:50 PM   "

## 2024-06-18 LAB — BASIC METABOLIC PANEL WITH GFR
Anion gap: 13 (ref 5–15)
BUN: 31 mg/dL — ABNORMAL HIGH (ref 6–20)
CO2: 27 mmol/L (ref 22–32)
Calcium: 8.3 mg/dL — ABNORMAL LOW (ref 8.9–10.3)
Chloride: 97 mmol/L — ABNORMAL LOW (ref 98–111)
Creatinine, Ser: 4.63 mg/dL — ABNORMAL HIGH (ref 0.44–1.00)
GFR, Estimated: 11 mL/min — ABNORMAL LOW
Glucose, Bld: 93 mg/dL (ref 70–99)
Potassium: 3.3 mmol/L — ABNORMAL LOW (ref 3.5–5.1)
Sodium: 137 mmol/L (ref 135–145)

## 2024-06-18 LAB — CBC
HCT: 34 % — ABNORMAL LOW (ref 36.0–46.0)
Hemoglobin: 10.8 g/dL — ABNORMAL LOW (ref 12.0–15.0)
MCH: 25.9 pg — ABNORMAL LOW (ref 26.0–34.0)
MCHC: 31.8 g/dL (ref 30.0–36.0)
MCV: 81.5 fL (ref 80.0–100.0)
Platelets: 148 K/uL — ABNORMAL LOW (ref 150–400)
RBC: 4.17 MIL/uL (ref 3.87–5.11)
RDW: 18.3 % — ABNORMAL HIGH (ref 11.5–15.5)
WBC: 5.4 K/uL (ref 4.0–10.5)
nRBC: 0 % (ref 0.0–0.2)

## 2024-06-18 MED ORDER — SEVELAMER CARBONATE 800 MG PO TABS
1600.0000 mg | ORAL_TABLET | Freq: Three times a day (TID) | ORAL | Status: DC
  Administered 2024-06-18 – 2024-06-20 (×6): 1600 mg via ORAL
  Filled 2024-06-18 (×7): qty 2

## 2024-06-18 MED ORDER — CLONIDINE HCL 0.3 MG PO TABS: 0.3000 mg | ORAL_TABLET | Freq: Every day | ORAL | 11 refills | Status: AC

## 2024-06-18 MED ORDER — CINACALCET HCL 30 MG PO TABS
120.0000 mg | ORAL_TABLET | Freq: Every day | ORAL | Status: DC
  Administered 2024-06-19 – 2024-06-20 (×2): 120 mg via ORAL
  Filled 2024-06-18 (×2): qty 4

## 2024-06-18 MED ORDER — SALINE SPRAY 0.65 % NA SOLN
1.0000 | NASAL | Status: DC | PRN
  Administered 2024-06-18: 1 via NASAL
  Filled 2024-06-18: qty 44

## 2024-06-18 MED ORDER — LORAZEPAM 2 MG/ML IJ SOLN
1.0000 mg | Freq: Once | INTRAMUSCULAR | Status: AC
  Administered 2024-06-18: 1 mg via INTRAVENOUS
  Filled 2024-06-18: qty 1

## 2024-06-18 MED ORDER — CITALOPRAM HYDROBROMIDE 20 MG PO TABS
10.0000 mg | ORAL_TABLET | Freq: Every day | ORAL | Status: DC
  Administered 2024-06-18 – 2024-06-20 (×3): 10 mg via ORAL
  Filled 2024-06-18 (×3): qty 1

## 2024-06-18 MED ORDER — OXYCODONE HCL 5 MG PO TABS
5.0000 mg | ORAL_TABLET | Freq: Once | ORAL | Status: AC
  Administered 2024-06-18: 5 mg via ORAL
  Filled 2024-06-18: qty 1

## 2024-06-18 MED ORDER — GABAPENTIN 100 MG PO CAPS
100.0000 mg | ORAL_CAPSULE | Freq: Three times a day (TID) | ORAL | Status: DC
  Administered 2024-06-18 – 2024-06-20 (×7): 100 mg via ORAL
  Filled 2024-06-18 (×7): qty 1

## 2024-06-18 MED ORDER — DICLOFENAC SODIUM 1 % EX GEL
2.0000 g | Freq: Two times a day (BID) | CUTANEOUS | Status: DC | PRN
  Filled 2024-06-18: qty 100

## 2024-06-18 MED ORDER — CLONIDINE HCL 0.1 MG PO TABS
0.3000 mg | ORAL_TABLET | Freq: Every day | ORAL | Status: DC
  Administered 2024-06-18 – 2024-06-20 (×3): 0.3 mg via ORAL
  Filled 2024-06-18 (×3): qty 3

## 2024-06-18 NOTE — Progress Notes (Signed)
 " [facility]  PROGRESS NOTE   Subjective:   Patient seen at bedside comfortable today.  Had stable dialysis yesterday.  Objective:  Vital signs: Blood pressure (!) 143/93, pulse 81, temperature 98 F (36.7 C), temperature source Oral, resp. rate 12, height 5' 3 (1.6 m), weight 78.1 kg, SpO2 100%. No intake or output data in the 24 hours ending 06/18/24 1356 Filed Weights   06/15/24 1916 06/15/24 1923 06/17/24 0917  Weight: 81.6 kg 81.6 kg 78.1 kg     Physical Exam: General:  No acute distress  Head:  Normocephalic, atraumatic. Moist oral mucosal membranes  Eyes:  Anicteric  Neck:  Supple  Lungs:   Clear to auscultation, normal effort  Heart:  S1S2 no rubs  Abdomen:   Soft, nontender, bowel sounds present  Extremities:  peripheral edema.  Neurologic:  Awake, alert, following commands  Skin:  No lesions  Access:     Basic Metabolic Panel: Recent Labs  Lab 06/15/24 1943 06/16/24 0304 06/17/24 0451 06/18/24 0416  NA 141 140 138 137  K 3.4* 3.4* 5.0 3.3*  CL 99 101 102 97*  CO2 27 26 23 27   GLUCOSE 100* 85 114* 93  BUN 37* 38* 47* 31*  CREATININE 4.86* 5.18* 6.46* 4.63*  CALCIUM  8.5* 8.1* 8.0* 8.3*  MG  --  2.4 2.6*  --   PHOS  --  2.9 3.7  --    GFR: Estimated Creatinine Clearance: 14.1 mL/min (A) (by C-G formula based on SCr of 4.63 mg/dL (H)).  Liver Function Tests: Recent Labs  Lab 06/15/24 1943 06/16/24 0304  AST 22 17  ALT 6 <5  ALKPHOS 384* 347*  BILITOT 0.4 0.4  PROT 8.2* 7.3  ALBUMIN 4.8 4.2   No results for input(s): LIPASE, AMYLASE in the last 168 hours. No results for input(s): AMMONIA in the last 168 hours.  CBC: Recent Labs  Lab 06/15/24 1943 06/16/24 0304 06/17/24 0804 06/18/24 0416  WBC 8.1 6.6 6.0 5.4  NEUTROABS 5.7  --   --   --   HGB 12.2 11.3* 11.1* 10.8*  HCT 38.3 35.7* 34.4* 34.0*  MCV 81.5 81.9 81.1 81.5  PLT 179 160 173 148*     HbA1C: Hgb A1c MFr Bld  Date/Time Value Ref Range Status   06/16/2024 03:04 AM <4.2 (L) 4.8 - 5.6 % Final  03/07/2024 08:24 PM 5.8 (H) 4.8 - 5.6 % Final    Comment:    (NOTE) Diagnosis of Diabetes The following HbA1c ranges recommended by the American Diabetes Association (ADA) may be used as an aid in the diagnosis of diabetes mellitus.  Hemoglobin             Suggested A1C NGSP%              Diagnosis  <5.7                   Non Diabetic  5.7-6.4                Pre-Diabetic  >6.4                   Diabetic  <7.0                   Glycemic control for                       adults with diabetes.      Urinalysis: No results for input(s): COLORURINE,  LABSPEC, PHURINE, GLUCOSEU, HGBUR, BILIRUBINUR, KETONESUR, PROTEINUR, UROBILINOGEN, NITRITE, LEUKOCYTESUR in the last 72 hours.  Invalid input(s): APPERANCEUR    Imaging: MR BRAIN WO CONTRAST Result Date: 06/16/2024 EXAM: MRI BRAIN WITHOUT CONTRAST 06/16/2024 06:09:36 PM TECHNIQUE: Multiplanar multisequence MRI of the head/brain was performed without the administration of intravenous contrast. COMPARISON: Head CT and CTA 06/15/2024 and MRI 03/08/2024. CLINICAL HISTORY: Stroke, follow up. Headache, dizziness, and imbalance. History of stroke. FINDINGS: The examination is intermittently up to moderately motion degraded. BRAIN AND VENTRICLES: There is no evidence of an acute infarct, mass, midline shift, hydrocephalus, or extra-axial fluid collection. Numerous chronic microhemorrhages are again noted primarily involving the deep gray nuclei, brainstem, and cerebellum suggestive of chronic hypertension. Patchy to confluent T2 hyperintensities in the cerebral white matter bilaterally are similar to the prior MRI and nonspecific but compatible with severe chronic small vessel ischemic disease. Chronic lacunar infarcts are noted bilaterally in the deep cerebral white matter, basal ganglia, and thalami as well as pons. There are also unchanged small chronic cortical infarcts at  the right temporooccipital junction and in the medial left parietal lobe. There is mild cerebral atrophy. A partially empty sella is noted. Major intracranial vascular flow voids are preserved. ORBITS: No significant abnormality. SINUSES AND MASTOIDS: Moderately large right mastoid effusion. Clear paranasal sinuses. BONES AND SOFT TISSUES: Heterogeneous calvarial bone marrow signal and diffusely diminished bone marrow T1 signal intensity in the cervical spine, similar to the prior MRI and nonspecific but may be related to end stage renal disease and anemia. No soft tissue abnormality. IMPRESSION: 1. No acute intracranial abnormality. 2. Severe chronic small vessel ischemic disease with multiple chronic infarcts and chronic microhemorrhages. Electronically signed by: Dasie Hamburg MD 06/16/2024 06:55 PM EST RP Workstation: [id number]     Medications:      stroke: early stages of recovery book   Does not apply Once   amLODipine   10 mg Oral Daily   aspirin  EC  81 mg Oral Daily   atorvastatin   40 mg Oral QPM   Chlorhexidine  Gluconate Cloth  6 each Topical Q0600   cloNIDine   0.3 mg Oral Daily   heparin  injection (subcutaneous)  5,000 Units Subcutaneous Q8H   hydrALAZINE   100 mg Oral Q6H   irbesartan   300 mg Oral Daily   isosorbide  mononitrate  120 mg Oral QPM   labetalol   300 mg Oral BID   melatonin  10 mg Oral QHS   pantoprazole   40 mg Oral Daily   traZODone   100 mg Oral QHS    Assessment/ Plan:      54 y.o. female with past medical history of ESRD on HD, hypertension, congestive heart failure, migraine headaches, history of intracranial hemorrhage, cocaine abuse, sickle cell trait who was seen for Slurred speech and hypertensive urgency  #1: ESRD: Patient had stable dialysis yesterday.  Tolerated 1L fluid removal.  #2: Hypertensive urgency: Blood pressure significantly improved.  Presently on amlodipine , clonidine , hydralazine , labetalol  and irbesartan .  #3: Anemia: Anemia secondary to  chronic kidney disease: Will continue to monitor closely.  #4: Second hyperparathyroidism: PTH levels from last year were 1248, calcium  was 8.0 phosphorus 3.7.  Will continue to monitor closely.  Labs and medications reviewed. Will continue to follow along with you.   LOS: 2 Pinkey Edman, MD [facility] 1/17/20261:56 PM  "

## 2024-06-18 NOTE — ED Notes (Signed)
 Pt given phone per request to make a call.
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
## 2024-06-18 NOTE — Evaluation (Signed)
 Physical Therapy Evaluation Patient Details Name: Allison Hill MRN: [id number] DOB: Oct 09, 1970 Today's Date: 06/18/2024  History of Present Illness  Pt is a 54 y/o F admitted on 06/15/24 after presenting with c/o acute onset HA with slurred speech & vision changes, pt found to have LUE weakness & ataxia, L sided facial droop. Pt received TNK. Brain imaging negative for acute issues. PMH: ESRD on HD, HTN, stroke, CHF  Clinical Impression  Pt seen for PT evaluation with pt pleasant, agreeable to tx. Pt reports prior to admission she was LTC resident at [facility], transfers to w/c without assistance, ambulates 50 ft or less with RW without assistance, denies falls in the past 6 months. On this date, pt presents with L sided weakness, impaired gait, decreased strength. Pt requires CGA for gait with RW. Pt would benefit from ongoing PT services to progress mobility as able.        If plan is discharge home, recommend the following: A little help with walking and/or transfers;A little help with bathing/dressing/bathroom;Assistance with cooking/housework;Assist for transportation;Help with stairs or ramp for entrance   Can travel by private vehicle   Yes    Equipment Recommendations None recommended by PT  Recommendations for Other Services       Functional Status Assessment Patient has had a recent decline in their functional status and demonstrates the ability to make significant improvements in function in a reasonable and predictable amount of time.     Precautions / Restrictions Precautions Precautions: Fall Precaution/Restrictions Comments: L hemi Restrictions Weight Bearing Restrictions Per Provider Order: No      Mobility  Bed Mobility Overal bed mobility: Needs Assistance Bed Mobility: Supine to Sit, Sit to Supine     Supine to sit: Supervision, HOB elevated Sit to supine: Supervision, HOB elevated   General bed mobility comments: from ED stretcher, exit R side of  stretcher    Transfers Overall transfer level: Needs assistance Equipment used: Rolling walker (2 wheels) Transfers: Sit to/from Stand Sit to Stand: Contact guard assist                Ambulation/Gait Ambulation/Gait assistance: Contact guard assist Gait Distance (Feet): 50 Feet Assistive device: Rolling walker (2 wheels) Gait Pattern/deviations: Decreased step length - right, Decreased step length - left, Decreased dorsiflexion - left, Decreased stride length Gait velocity: decreased     General Gait Details: Pt with L foot externally rotated, minimal dorsiflexion in swing phase, minimal heel strike but as pt begins to fatigue L foot begins to drag more with pt reporting awareness but difficutly correcting. Cuing re: safety to ambulate within base of AD when LLE fatigues.  Stairs            Wheelchair Mobility     Tilt Bed    Modified Rankin (Stroke Patients Only)       Balance Overall balance assessment: Needs assistance Sitting-balance support: No upper extremity supported, Feet unsupported Sitting balance-Leahy Scale: Good Sitting balance - Comments: pt able to don socks sitting edge of ED stretcher with supervision, no LOB.   Standing balance support: During functional activity, Bilateral upper extremity supported, Reliant on assistive device for balance Standing balance-Leahy Scale: Fair                               Pertinent Vitals/Pain Pain Assessment Pain Assessment: 0-10 Pain Score: 9  Pain Location: L shoulder Pain Descriptors / Indicators: Discomfort, Sharp Pain Intervention(s): Monitored  during session, Limited activity within patient's tolerance    Home Living Family/patient expects to be discharged to:: Skilled nursing facility                   Additional Comments: LTC at [facility] since April 2025    Prior Function               Mobility Comments: Pt transfers self into wheelchair and propels self - reports  she doesn't walk more than 50 ft at a time with RW at [facility], denies falls ADLs Comments: Reports she can get dressed & toilet without assistance.     Extremity/Trunk Assessment   Upper Extremity Assessment Upper Extremity Assessment: Right hand dominant;LUE deficits/detail LUE Deficits / Details: able to lift BUE to shoulder height (90 degrees) but weakness noted in LUE as pt slowly lowers    Lower Extremity Assessment Lower Extremity Assessment: LLE deficits/detail LLE Deficits / Details: 3-/5 knee extension in sitting       Communication   Communication Communication: No apparent difficulties    Cognition Arousal: Alert Behavior During Therapy: WFL for tasks assessed/performed   PT - Cognitive impairments: No apparent impairments                         Following commands: Intact       Cueing Cueing Techniques: Verbal cues     General Comments General comments (skin integrity, edema, etc.): 151/93 (112) LUE lying    Exercises     Assessment/Plan    PT Assessment Patient needs continued PT services  PT Problem List Decreased strength;Cardiopulmonary status limiting activity;Decreased coordination;Decreased range of motion;Decreased activity tolerance;Decreased knowledge of use of DME;Decreased safety awareness;Decreased balance;Decreased mobility;Decreased knowledge of precautions;Pain       PT Treatment Interventions DME instruction;Therapeutic exercise;Balance training;Gait training;Wheelchair mobility training;Stair training;Neuromuscular re-education;Functional mobility training;Therapeutic activities;Patient/family education;Modalities;Manual techniques    PT Goals (Current goals can be found in the Care Plan section)  Acute Rehab PT Goals Patient Stated Goal: return home ([facility] LTC) PT Goal Formulation: With patient Time For Goal Achievement: 07/02/24 Potential to Achieve Goals: Good    Frequency Min 3X/week     Co-evaluation                AM-PAC PT 6 Clicks Mobility  Outcome Measure Help needed turning from your back to your side while in a flat bed without using bedrails?: None Help needed moving from lying on your back to sitting on the side of a flat bed without using bedrails?: A Little Help needed moving to and from a bed to a chair (including a wheelchair)?: A Little Help needed standing up from a chair using your arms (e.g., wheelchair or bedside chair)?: A Little Help needed to walk in hospital room?: A Little Help needed climbing 3-5 steps with a railing? : A Lot 6 Click Score: 18    End of Session   Activity Tolerance: Patient tolerated treatment well;Patient limited by fatigue Patient left: in bed;with call bell/phone within reach;with bed alarm set   PT Visit Diagnosis: Hemiplegia and hemiparesis;Other abnormalities of gait and mobility (R26.89);Muscle weakness (generalized) (M62.81);Difficulty in walking, not elsewhere classified (R26.2) Hemiplegia - Right/Left: Left Hemiplegia - dominant/non-dominant: Non-dominant Hemiplegia - caused by: Unspecified    Time: 9194-9176 PT Time Calculation (min) (ACUTE ONLY): 18 min   Charges:   PT Evaluation $PT Eval Low Complexity: 1 Low   PT General Charges $$ ACUTE PT VISIT: 1 Visit  Richerd Pinal, PT, DPT 06/18/24, 9:13 AM   [name] 06/18/2024, 9:12 AM

## 2024-06-18 NOTE — Progress Notes (Addendum)
 PHARMACY CONSULT NOTE - ELECTROLYTES  Pharmacy Consult for Electrolyte Monitoring and Replacement   Recent Labs: Height: 5' 3 (160 cm) Weight: 78.1 kg (172 lb 2.9 oz) IBW/kg (Calculated) : 52.4 Estimated Creatinine Clearance: 14.1 mL/min (A) (by C-G formula based on SCr of 4.63 mg/dL (H)). Potassium (mmol/L)  Date Value  06/18/2024 3.3 (L)   Magnesium (mg/dL)  Date Value  98/83/7973 2.6 (H)   Calcium  (mg/dL)  Date Value  98/82/7973 8.3 (L)   Albumin (g/dL)  Date Value  98/84/7973 4.2   Phosphorus (mg/dL)  Date Value  98/83/7973 3.7   Sodium (mmol/L)  Date Value  06/18/2024 137   Assessment  Allison Hill is a 54 y.o. female presenting to ED for code stroke. PMH significant for HTN and ESRD on HD. Pharmacy has been consulted to monitor and replace electrolytes.  Underwent HD 1/16.   Diet: Renal, fluid restricted MIVF: n/a Pertinent medications: irbesartan  300mg  twice daily  Goal of Therapy: Electrolytes WNL  Plan: HD patient, ok with K+ of 3.3, defer to nephro is needs to replace.  Pharmacy will sign off. Please re-consult if needed.   Thank you for allowing pharmacy to be a part of this patient's care.  Cathaleen Blanch, PharmD, BCPS Clinical Pharmacist 06/18/2024 7:53 AM

## 2024-06-18 NOTE — Progress Notes (Signed)
 "  NAME:  Allison Hill, MRN:  [id number], DOB:  1970-10-20, LOS: 2 ADMISSION DATE:  06/15/2024, CONSULTATION DATE:  06/15/24 REFERRING MD:  Dr. Floy, CHIEF COMPLAINT:  Headache   History of Present Illness:  Allison Hill is a 54 year old female with a medical history significant for ESRD on hemodialysis, hypertension, a previous stroke and congestive heart failure who presented to [facility] ED for evaluation of an acute onset headache with slurred speech and vision changes. A code stroke was called upon arrival.   History is gathered per chart review and evaluation at bedside The patient reported that she received her normal dialysis session completed at noon and later around 6:30pm developed a severe headache with acute onset. At that time she also noticed blurred vision with slurred speech. She denied any nausea or vomiting. Upon EMS arrival, her glucose was 99 with a blood pressure of 179/100s. Per patient, she is supposed to take aspirin  at home but does not. She denied recent surgery or bleeding of any kind.   ED course: Upon arrival to the ED, she was found to have left sided facial droop, left sided extremity weakness and LUE ataxia. Initial NIHSS was 9. Neurology was consulted for evaluation.  A CT Head Code Stroke was completed. Due to hypertension, she received 40mg  of IV labetalol  and started on a cleviprex  infusion prior to TNK administration. She was later taken for a Cta of the head and neck.   Vitals on Arrival: temp: 98.9, BP 179/115, HR 94, RR 18, SpO2 99 on room air  Pertinent Labs/Diagnostics: Upon arrival she was found to have very mild hypokalemia with acute on chronic renal failure. Other lab results unremarkable  I, Robet Kim, PA-C personally viewed and interpreted this ECG. EKG Interpretation: Date: 06/16/24, EKG Time: 0046, Rate: 87, Rhythm: sinus rhythm, QRS Axis:  lvh with RAE, anterior Q waves Intervals: prolonged pr interval, prolonged QT interval ST/T Wave  abnormalities: no acute ST elevation, Narrative Interpretation: abnormal ECG with prolonged Qtc, RAE and LVH  Chemistry: Na+:141, K+: 3.4, BUN/Cr.: 37/4.86, Serum CO2/ AG: 27/15, AlkPhos 384, glucose 100  Hematology: WBC: 8.1, Hgb: 12.2, Platelets 179, aPTT: 43  CT Head Code Stroke Impression:  1. No acute intracranial abnormality. 2. ASPECTS is 10. 3. Age-related cerebral atrophy with moderate chronic microvascular ischemic disease, with multiple remote lacunar infarcts about the deep gray nuclei.  Cta Head/Neck Code Stroke Impression: IMPRESSION: 1. Negative CTA for large vessel occlusion or other emergent finding. 2. Mild atheromatous change about the carotid siphons without hemodynamically significant stenosis. 3. Diffuse tortuosity of the major arterial vasculature of the head and neck, suggesting chronic underlying hypertension.  Medications Administered: labetalol  40mg  + cleviprex  gtt, TNK, fentanyl  50mcg   PCCM consulted for admission due to post TNK administration.   Pertinent  Medical History  Stroke Hypertension Congestive Heart Failure ESRD on Hemodialysis History of Cocaine Abuse Chronic Thrombocytopenia  Significant Hospital Events: Including procedures, antibiotic start and stop dates in addition to other pertinent events   06/15/14: Presented to [facility] with acute headache with slurred speech and vision changes. Code stroke called, CT Head completed, given labetalol  and started on a cleviprex  gtt prior to TNK administration. 06/16/24- patient clinically doing well, she had TTE today and it looked reassuring to me.  She has repeat neuroimaging tommorow.  She has HD tommorow.  06/17/24- with accelerated HTN this am on exam with anasarca.  06/18/24- patient with accelerated HTN again this am,  she is from [facility] and  we are unable to dc her today.  TOC consult is in order.  Patient completed PT/OT.  Will move to progressive and transfer to TRH.    Objective     Blood pressure (!) 174/108, pulse 83, temperature 98.1 F (36.7 C), temperature source Oral, resp. rate 14, height 5' 3 (1.6 m), weight 78.1 kg, SpO2 100%.        Intake/Output Summary (Last 24 hours) at 06/18/2024 0801 Last data filed at 06/17/2024 1314 Gross per 24 hour  Intake --  Output 2000 ml  Net -2000 ml   Filed Weights   06/15/24 1916 06/15/24 1923 06/17/24 0917  Weight: 81.6 kg 81.6 kg 78.1 kg    Examination: General: Adult female acutely ill, lying in bed in NAD HENT: anicteric sclera, atraumatic, normocephalic, neck supple, no JVD CV: RRR, S1 S2, NSR on monitor, no r/m/g Pulm: Regular, non labored on room air , breath sounds equal throughout GI: soft, non-tender, non-distended, no rebound/guarding, bs x 4 Extremities: warm/dry, pulses + 2 R/P, no edema noted Neuro: A&O x 4, follows commands, right sided facial droop with LLE weakness and mild LUE ataxia, diminished sensation on LLE, PERRL GU: deferred    Assessment and Plan   #Suspected Stroke ~ Ruled out 06/15/24 CTH: no acute intracranial abnormality; age related cerebral atrophy with moderate chronic microvascular disease with multiple remote lacunar infarcts 06/15/24 CT angio head/neck/perfusion: Negative CTA for large vessel occlusion or other emergent finding. Mild atheromatous change about the carotid siphons without hemodynamically significant stenosis. Diffuse tortuosity of the major arterial vasculature of the head and neck, suggesting chronic underlying hypertension. - NIHSS q73min x 2 hours, then q x 6 h, then q1h x 16h followed by q4h until discharge - Bedside swallow > SLP eval if needed - Follow up MRI at 24 hours  - Echocardiogram ordered - Lipid panel wnl - Hgb A1c ordered - VTE prophylaxis: SCDs - No antiplatelets or anticoagulants for initial 24 hours - Not on [facility] prior to admission - PT/OT consult - STAT CT head if acute neuro changes develop - Ensure euglycemia - Avoid  hyperthermia; tylenol  prn - Neurology following, appreciate input  #Hypertension - Continuous cardiac monitoring - Ensure MAP >65 ~ not requiring vasopressors - BP goal <180/105 - Continue cleviprex  gtt - Wean as able - Once off of cleviprex  gtt, will add in prn antihypertensives - ECHO ordered  #Hypokalemia #ESRD on HD Creatinine on admission 4.86 - Trend BMP - Avoid nephrotoxins as able - Ensure adequate renal perfusion - Electrolyte replacement as indicated - Received K replacement overnight  #Transaminitis - Monitor hepatic function - RUQ US : negative for acute abnormality - Constipation protocol as indicated - NPO for now   Labs   CBC: Recent Labs  Lab 06/15/24 1943 06/16/24 0304 06/17/24 0804 06/18/24 0416  WBC 8.1 6.6 6.0 5.4  NEUTROABS 5.7  --   --   --   HGB 12.2 11.3* 11.1* 10.8*  HCT 38.3 35.7* 34.4* 34.0*  MCV 81.5 81.9 81.1 81.5  PLT 179 160 173 148*    Basic Metabolic Panel: Recent Labs  Lab 06/15/24 1943 06/16/24 0304 06/17/24 0451 06/18/24 0416  NA 141 140 138 137  K 3.4* 3.4* 5.0 3.3*  CL 99 101 102 97*  CO2 27 26 23 27   GLUCOSE 100* 85 114* 93  BUN 37* 38* 47* 31*  CREATININE 4.86* 5.18* 6.46* 4.63*  CALCIUM  8.5* 8.1* 8.0* 8.3*  MG  --  2.4 2.6*  --  PHOS  --  2.9 3.7  --    GFR: Estimated Creatinine Clearance: 14.1 mL/min (A) (by C-G formula based on SCr of 4.63 mg/dL (H)). Recent Labs  Lab 06/15/24 1943 06/16/24 0304 06/17/24 0804 06/18/24 0416  WBC 8.1 6.6 6.0 5.4    Liver Function Tests: Recent Labs  Lab 06/15/24 1943 06/16/24 0304  AST 22 17  ALT 6 <5  ALKPHOS 384* 347*  BILITOT 0.4 0.4  PROT 8.2* 7.3  ALBUMIN 4.8 4.2   No results for input(s): LIPASE, AMYLASE in the last 168 hours. No results for input(s): AMMONIA in the last 168 hours.  ABG No results found for: PHART, PCO2ART, PO2ART, HCO3, TCO2, ACIDBASEDEF, O2SAT   Coagulation Profile: Recent Labs  Lab 06/15/24 1943  INR 0.9     Cardiac Enzymes: No results for input(s): CKTOTAL, CKMB, CKMBINDEX, TROPONINI in the last 168 hours.  HbA1C: Hgb A1c MFr Bld  Date/Time Value Ref Range Status  06/16/2024 03:04 AM <4.2 (L) 4.8 - 5.6 % Final  03/07/2024 08:24 PM 5.8 (H) 4.8 - 5.6 % Final    Comment:    (NOTE) Diagnosis of Diabetes The following HbA1c ranges recommended by the American Diabetes Association (ADA) may be used as an aid in the diagnosis of diabetes mellitus.  Hemoglobin             Suggested A1C NGSP%              Diagnosis  <5.7                   Non Diabetic  5.7-6.4                Pre-Diabetic  >6.4                   Diabetic  <7.0                   Glycemic control for                       adults with diabetes.      CBG: Recent Labs  Lab 06/15/24 1927  GLUCAP 99    Review of Systems:   Endorses headache without dizziness, vision changes, nausea/vomiting, chest pain or shortness of breath.   Past Medical History:  She,  has a past medical history of CHF (congestive heart failure) (HCC), Hypertension, Renal disorder, and Stroke (HCC).   Surgical History:  History reviewed. No pertinent surgical history.   Social History:   reports that she has quit smoking. Her smoking use included cigarettes. She has never used smokeless tobacco. She reports that she does not currently use alcohol. She reports current drug use. Drug: Codeine.   Family History:  Her family history includes CAD in her brother; Heart failure in her mother.   Allergies Allergies[1]   Home Medications  Prior to Admission medications  Medication Sig Start Date End Date Taking? Authorizing Provider  acetaminophen  (TYLENOL ) 500 MG tablet Take 500 mg by mouth 3 (three) times daily.   Yes [provider]  amLODipine  (NORVASC ) 10 MG tablet Take 1 tablet (10 mg total) by mouth daily. 01/24/24 06/07/25 Yes Ray, Nilsa, MD  aspirin  EC 325 MG tablet Take 1 tablet (325 mg total) by mouth daily. 03/11/24   Yes Lenon Marien CROME, MD  atorvastatin  (LIPITOR) 40 MG tablet Take 40 mg by mouth every evening. 07/05/23  Yes [provider]  calcitRIOL  (ROCALTROL ) 0.25  MCG capsule Take 1 capsule (0.25 mcg total) by mouth 4 (four) times a week. Take one capsule by mouth every Mon, Tue, Wed, Fri with dialysis Patient taking differently: Take 0.75 mcg by mouth See admin instructions. Administered at dialysis every Mon, Tue, Wed, and Fri. 12/10/23  Yes Dezii, Alexandra, DO  calcium  carbonate (TUMS - DOSED IN MG ELEMENTAL CALCIUM ) 500 MG chewable tablet Chew 2 tablets (400 mg of elemental calcium  total) by mouth 2 (two) times daily. Patient taking differently: Chew 400 mg of elemental calcium  by mouth 2 (two) times daily as needed for indigestion. 09/23/23  Yes Awanda City, MD  cinacalcet  (SENSIPAR ) 60 MG tablet Take 120 mg by mouth in the morning. 11/30/23  Yes [provider]  citalopram  (CELEXA ) 10 MG tablet Take 10 mg by mouth daily.   Yes [provider]  cloNIDine  (CATAPRES  - DOSED IN MG/24 HR) 0.3 mg/24hr patch Place 0.3 mg onto the skin every Saturday.   Yes [provider]  Dextran 70-Hypromellose (ARTIFICIAL TEARS PF OP) Apply to eye.   Yes [provider]  diclofenac  Sodium (VOLTAREN ) 1 % GEL Apply 2 g topically 2 (two) times daily as needed (shoulder pain.).   Yes [provider]  doxycycline (VIBRAMYCIN) 50 MG capsule Take 50 mg by mouth 2 (two) times daily. 06/13/24  Yes [provider]  fluticasone  (FLONASE ) 50 MCG/ACT nasal spray Place 1 spray into both nostrils daily as needed for allergies or rhinitis. 09/23/23  Yes Awanda City, MD  gabapentin  (NEURONTIN ) 100 MG capsule Take 200 mg by mouth 3 (three) times daily.   Yes [provider]  hydrALAZINE  (APRESOLINE ) 100 MG tablet Take 100 mg by mouth 3 (three) times daily. 05/19/24  Yes [provider]  indapamide (LOZOL) 1.25 MG tablet Take 1.25 mg by mouth daily at 12 noon. 05/23/24   Yes [provider]  irbesartan  (AVAPRO ) 300 MG tablet Take 1 tablet (300 mg total) by mouth daily. 02/02/24  Yes Josette Ade, MD  isosorbide  mononitrate (IMDUR ) 120 MG 24 hr tablet Take 120 mg by mouth every evening.   Yes [provider]  labetalol  (NORMODYNE ) 300 MG tablet Take 300 mg by mouth 2 (two) times daily. 06/05/24  Yes [provider]  lidocaine  4 % Place 1 patch onto the skin at bedtime as needed (back pain.). Remove & Discard patch within 12 hours or as directed by MD. Apply to lower back in the night and remove at the morning.   Yes [provider]  melatonin 3 MG TABS tablet Take 3 mg by mouth at bedtime. 12/26/22  Yes [provider]  pantoprazole  (PROTONIX ) 40 MG tablet Take 1 tablet (40 mg total) by mouth daily. 09/24/23  Yes Awanda City, MD  polyethylene glycol (MIRALAX  / GLYCOLAX ) 17 g packet Take 17 g by mouth in the morning. Scheduled   Yes [provider]  sennosides-docusate sodium  (SENOKOT-S) 8.6-50 MG tablet Take 1 tablet by mouth daily.   Yes [provider]  sevelamer  carbonate (RENVELA ) 800 MG tablet Take 1,600 mg by mouth 3 (three) times daily with meals.   Yes [provider]  traZODone  (DESYREL ) 50 MG tablet Take 50 mg by mouth at bedtime. 03/03/24  Yes [provider]  epoetin  alfa (EPOGEN ) 4000 UNIT/ML injection Inject 1 mL (4,000 Units total) into the vein Every Tuesday,Thursday,and Saturday with dialysis. Patient taking differently: Inject 4,000 Units into the vein See admin instructions. Administer 1 ml into the vein every Monday, Tuesday,  Wednesday and Friday. Given at dialysis 09/24/23   Awanda City, MD  polyethylene glycol (MIRALAX  / GLYCOLAX ) 17 g packet Take 17 g by mouth daily as needed. 09/23/23   Awanda City, MD     Critical care provider statement:   Total critical care time: 33 minutes   Performed by: Parris MD   Critical care time was exclusive of separately billable  procedures and treating other patients.   Critical care was necessary to treat or prevent imminent or life-threatening deterioration.   Critical care was time spent personally by me on the following activities: development of treatment plan with patient and/or surrogate as well as nursing, discussions with consultants, evaluation of patient's response to treatment, examination of patient, obtaining history from patient or surrogate, ordering and performing treatments and interventions, ordering and review of laboratory studies, ordering and review of radiographic studies, pulse oximetry and re-evaluation of patient's condition.    Leiyah Maultsby, M.D.  Pulmonary & Critical Care Medicine              [1]  Allergies Allergen Reactions   Ceftriaxone Shortness Of Breath and Hives    Allergy, hives   Shellfish Allergy Hives   Minoxidil Swelling   "

## 2024-06-18 NOTE — Evaluation (Signed)
 Occupational Therapy Evaluation Patient Details Name: Allison Hill MRN: [id number] DOB: Apr 24, 1971 Today's Date: 06/18/2024   History of Present Illness   Pt is a 54 y/o F admitted on 06/15/24 after presenting with c/o acute onset HA with slurred speech & vision changes, pt found to have LUE weakness & ataxia, L sided facial droop. Pt received TNK. Brain imaging negative for acute issues. PMH: ESRD on HD, HTN, stroke, CHF     Clinical Impressions Patient was seen for OT evaluation this date. Prior to hospital admission, patient was long term resident at [facility]. Typically manages ADLs on her own but staff can assist if needed. Patient primarily limited this day by L shoulder pain (related to OA) OT educated in scap mobility and positioning with good return demo. Patient performs sit<>stand with CGA.  Patient presents with deficits in dynamic standing balance and overall activity tolerance, affecting safe and optimal ADL completion.  Paient would benefit from skilled OT services to address noted impairments and functional limitations (see below for any additional details) in order to maximize safety and independence while minimizing future risk of falls, injury, and readmission. Anticipate the need for follow up OT services upon acute hospital DC.      If plan is discharge home, recommend the following:   A little help with walking and/or transfers;A little help with bathing/dressing/bathroom     Functional Status Assessment   Patient has had a recent decline in their functional status and demonstrates the ability to make significant improvements in function in a reasonable and predictable amount of time.     Equipment Recommendations   None recommended by OT     Recommendations for Other Services         Precautions/Restrictions   Precautions Precautions: Fall Recall of Precautions/Restrictions: Intact Precaution/Restrictions Comments: L hemi Restrictions Weight  Bearing Restrictions Per Provider Order: No     Mobility Bed Mobility               General bed mobility comments: not tested, OOB pre/post tx    Transfers Overall transfer level: Needs assistance Equipment used: Rolling walker (2 wheels) Transfers: Sit to/from Stand Sit to Stand: Contact guard assist                  Balance Overall balance assessment: Needs assistance Sitting-balance support: No upper extremity supported, Feet unsupported Sitting balance-Leahy Scale: Good Sitting balance - Comments: pt able to don socks sitting edge of ED stretcher with supervision, no LOB.                                   ADL either performed or assessed with clinical judgement   ADL Overall ADL's : At baseline                                       General ADL Comments: abel to toilet, dress, groom and feed self     Vision         Perception         Praxis         Pertinent Vitals/Pain Pain Assessment Pain Assessment: 0-10 Pain Score: 5  Pain Location: L shoulder Pain Descriptors / Indicators: Discomfort, Sharp Pain Intervention(s): Monitored during session, Relaxation     Extremity/Trunk Assessment Upper Extremity Assessment Upper Extremity Assessment: Right hand dominant;LUE  deficits/detail LUE Deficits / Details: 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 which limits pain free mobility, strength WNL   Lower Extremity Assessment Lower Extremity Assessment: Defer to PT evaluation LLE Deficits / Details: 3-/5 knee extension in sitting       Communication Communication Communication: No apparent difficulties   Cognition Arousal: Alert Behavior During Therapy: WFL for tasks assessed/performed Cognition: No apparent impairments                               Following commands: Intact       Cueing  General Comments   Cueing Techniques: Verbal cues  151/93 (112) LUE lying   Exercises     Shoulder Instructions       Home Living Family/patient expects to be discharged to:: Skilled nursing facility                                 Additional Comments: LTC at [facility] since April 2025      Prior Functioning/Environment Prior Level of Function : Needs assist             Mobility Comments: Pt transfers self into wheelchair and propels self - reports she doesn't walk more than 50 ft at a time with RW at [facility], denies falls ADLs Comments: Reports she can get dressed & toilet without assistance.    OT Problem List: Decreased strength;Decreased activity tolerance   OT Treatment/Interventions: Self-care/ADL training;Therapeutic exercise;Neuromuscular education;Energy conservation      OT Goals(Current goals can be found in the care plan section)   Acute Rehab OT Goals Patient Stated Goal: to get stronger OT Goal Formulation: With patient Time For Goal Achievement: 07/02/24 Potential to Achieve Goals: Good ADL Goals Pt Will Perform Grooming: with modified independence;standing Pt Will Perform Lower Body Dressing: with modified independence;sit to/from stand Pt Will Transfer to Toilet: with modified independence;ambulating   OT Frequency:  Min 2X/week    Co-evaluation              AM-PAC OT 6 Clicks Daily Activity     Outcome Measure Help from another person eating meals?: None Help from another person taking care of personal grooming?: None Help from another person toileting, which includes using toliet, bedpan, or urinal?: A Little Help from another person bathing (including washing, rinsing, drying)?: A Little Help from another person to put on and taking off regular upper body clothing?: None Help from another person to put on and taking off regular lower body clothing?: A Little 6 Click Score: 21   End of Session Equipment Utilized During Treatment: Rolling walker (2 wheels) Nurse Communication: Mobility status  Activity Tolerance: Patient tolerated  treatment well Patient left: in chair;with call bell/phone within reach;with chair alarm set  OT Visit Diagnosis: Unsteadiness on feet (R26.81)                Time: 9094-9083 OT Time Calculation (min): 11 min Charges:  OT General Charges $OT Visit: 1 Visit OT Evaluation $OT Eval Low Complexity: 1 Low  Rogers Clause, OT/L MSOT, 06/18/2024

## 2024-06-18 NOTE — ED Notes (Signed)
 The pt was able to ambulate a few steps and have a seat in the recliner without incident.
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
## 2024-06-18 NOTE — Discharge Summary (Incomplete)
 Physician Discharge Summary  Patient ID: Allison Hill MRN: [id number] DOB/AGE: 07-13-1970 54 y.o.  Admit date: 06/15/2024 Discharge date: 06/18/2024    Discharge Diagnoses:  Hypertension Emergency likely resulting in recrudescence of previous stroke (MRI Brain negative for acute stroke)  Previous stroke with Left- Sided Hemiparesis (2015, 2023)  Chronic diastolic CHF  ESRD on HD  Thrombocytopenia                                                                     DISCHARGE PLAN BY DIAGNOSIS   Uncontrolled HTN  Recrudescence if previous stroke secondary to hypertensive emergency (MRI Brain negative for acute stroke) Plan: - Follow-up with PCP 1 week following discharge  - Start taking aspirin  81 mg by mouth daily and clonidine  0.3 mg by mouth daily (stop taking clonidine  patch) - Continue amlodipine , atorvastatin , hydralazine , irbesartan , isosorbide  mononitrate, and labetalol  - Continue bp monitoring  - Per neurology recommendations follow-up with [facility] Neurology  - Strict instructions provided on when to return to ED and/or seek medical attention  - Continue PT/OT at [facility]   Chronic diastolic CHF  Plan: - Fluid removal via HD  - Daily weights   ESRD on HD  Plan: Follow-up with nephrology: HD per recommendations   Thrombocytopenia  - Return to the ER or seek medical attention immediately if there are any signs of bleeding          DISCHARGE SUMMARY   Allison Hill is a 54 y.o. y/o female with a PMH of Allison Hill is a 54 year old female with a medical history significant for ESRD on hemodialysis, hypertension, a previous stroke and congestive heart failure who presented to [facility] ED for evaluation of an acute onset headache with slurred speech and vision changes. A code stroke was called upon arrival.    History is gathered per chart review and evaluation at bedside The patient reported that she received her normal dialysis session completed at noon and  later around 6:30pm developed a severe headache with acute onset. At that time she also noticed blurred vision with slurred speech. She denied any nausea or vomiting. Upon EMS arrival, her glucose was 99 with a blood pressure of 179/100s. Per patient, she is supposed to take aspirin  at home but does not. She denied recent surgery or bleeding of any kind.    ED course: Upon arrival to the ED, she was found to have left sided facial droop, left sided extremity weakness and LUE ataxia. Initial NIHSS was 9. Neurology was consulted for evaluation.  A CT Head Code Stroke was completed. Due to hypertension, she received 40mg  of IV labetalol  and started on a cleviprex  infusion prior to TNK administration. MRI Brain on 01/16 negative for acute stroke.  Per Neurology symptoms on admission likely due to recrudescence of previous stroke in the setting of hypertension emergency, pt started on aspirin  81 mg daily.  Pt with continued uncontrolled hypertension requiring adjustments of antihypertensives.    SIGNIFICANT DIAGNOSTIC STUDIES 06/15/24: CT Head Code Stroke revealed no acute intracranial abnormality. ASPECTS is 10. Age-related cerebral atrophy with moderate chronic microvascular ischemic disease, with multiple remote lacunar infarcts about the deep gray nuclei.   06/15/2024: CTA Head/Neck Code Stroke Impression negative for large vessel occlusion or other  emergent finding. Mild atheromatous change about the carotid siphons without hemodynamically significant stenosis. Diffuse tortuosity of the major arterial vasculature of the head and neck, suggesting chronic underlying hypertension.  06/16/2024: Echo revealed EF 60 to 65%, grade I diastolic dysfunction, mild mitral valve regurgitation   06/16/2024: US  Abd Limited RUQ no acute abnormality in the right upper quadrant   06/16/24: MRI Brain No acute intracranial abnormality. Severe chronic small vessel ischemic disease with multiple chronic infarcts and  chronic microhemorrhages.    SIGNIFICANT EVENTS 06/15/14: Presented to [facility] with acute headache with slurred speech and vision changes. Code stroke called, CT Head completed, given labetalol  and started on a cleviprex  gtt prior to TNK administration. 06/16/24- patient clinically doing well, she had TTE today and it looked reassuring to me.  She has repeat neuroimaging tommorow.  She has HD tommorow.  06/17/24- with accelerated HTN this am on exam with anasarca.  Due to continued uncontrolled hypertension and requirement of cleviprex  gtt post HD unable to discharge pt home  MICRO DATA  MRSA PCR 06/16/24>>negative   ANTIBIOTICS None   CONSULTS Neurology  Nephrology  Intensivist  PT/OT   TUBES / LINES Right Chest [facility] Cath Present On Admission    Discharge Exam: General: Chronically-ill appearing female, resting in bed, in NAD on RA  Neuro: A&O x 4, chronic left facial droop, bilateral upper and lower extremity motor strength 4/5  HEENT: Schleicher/AT. PERRL, sclerae anicteric. Cardiovascular: NSR, s1s2, no m/r/g, 2+ radial/2+ distal pulses, no edema, right chest perm cath  Lungs: Respirations even and unlabored.  CTA bilaterally, No W/R/R.  Abdomen: BS x 4, soft, NT/ND.  Musculoskeletal: No gross deformities, no edema.  Skin: Intact, warm, no rashes.   Vitals:   06/18/24 0507 06/18/24 0508 06/18/24 0525 06/18/24 0800  BP: (!) 174/108 (!) 174/108  (!) 173/112  Pulse: 83   91  Resp: 14   16  Temp:   98.1 F (36.7 C)   TempSrc:   Oral   SpO2: 100%   100%  Weight:      Height:         Discharge Labs  BMET Recent Labs  Lab 06/15/24 1943 06/16/24 0304 06/17/24 0451 06/18/24 0416  NA 141 140 138 137  K 3.4* 3.4* 5.0 3.3*  CL 99 101 102 97*  CO2 27 26 23 27   GLUCOSE 100* 85 114* 93  BUN 37* 38* 47* 31*  CREATININE 4.86* 5.18* 6.46* 4.63*  CALCIUM  8.5* 8.1* 8.0* 8.3*  MG  --  2.4 2.6*  --   PHOS  --  2.9 3.7  --     CBC Recent Labs  Lab 06/16/24 0304 06/17/24 0804  06/18/24 0416  HGB 11.3* 11.1* 10.8*  HCT 35.7* 34.4* 34.0*  WBC 6.6 6.0 5.4  PLT 160 173 148*    Anti-Coagulation Recent Labs  Lab 06/15/24 1943  INR 0.9          Allergies as of 06/18/2024       Reactions   Ceftriaxone Shortness Of Breath, Hives   Allergy, hives   Shellfish Allergy Hives   Minoxidil Swelling        Medication List     STOP taking these medications    cloNIDine  0.3 mg/24hr patch Commonly known as: CATAPRES  - Dosed in mg/24 hr       TAKE these medications    acetaminophen  500 MG tablet Commonly known as: TYLENOL  Take 500 mg by mouth 3 (three) times daily.   amLODipine   10 MG tablet Commonly known as: NORVASC  Take 1 tablet (10 mg total) by mouth daily.   ARTIFICIAL TEARS PF OP Apply to eye.   aspirin  EC 81 MG tablet Take 1 tablet (81 mg total) by mouth daily. Swallow whole. What changed:  medication strength how much to take additional instructions   atorvastatin  40 MG tablet Commonly known as: LIPITOR Take 40 mg by mouth every evening.   calcitRIOL  0.25 MCG capsule Commonly known as: ROCALTROL  Take 1 capsule (0.25 mcg total) by mouth 4 (four) times a week. Take one capsule by mouth every Mon, Tue, Wed, Fri with dialysis What changed:  how much to take when to take this additional instructions   calcium  carbonate 500 MG chewable tablet Commonly known as: TUMS - dosed in mg elemental calcium  Chew 2 tablets (400 mg of elemental calcium  total) by mouth 2 (two) times daily. What changed:  when to take this reasons to take this   cinacalcet  60 MG tablet Commonly known as: SENSIPAR  Take 120 mg by mouth in the morning.   citalopram  10 MG tablet Commonly known as: CELEXA  Take 10 mg by mouth daily.   cloNIDine  0.3 MG tablet Commonly known as: CATAPRES  Take 1 tablet (0.3 mg total) by mouth daily.   doxycycline 50 MG capsule Commonly known as: VIBRAMYCIN Take 50 mg by mouth 2 (two) times daily.   epoetin  alfa 4000  UNIT/ML injection Commonly known as: EPOGEN  Inject 1 mL (4,000 Units total) into the vein Every Tuesday,Thursday,and Saturday with dialysis. What changed:  when to take this additional instructions   fluticasone  50 MCG/ACT nasal spray Commonly known as: FLONASE  Place 1 spray into both nostrils daily as needed for allergies or rhinitis.   gabapentin  100 MG capsule Commonly known as: NEURONTIN  Take 200 mg by mouth 3 (three) times daily.   hydrALAZINE  100 MG tablet Commonly known as: APRESOLINE  Take 100 mg by mouth 3 (three) times daily.   indapamide 1.25 MG tablet Commonly known as: LOZOL Take 1.25 mg by mouth daily at 12 noon.   irbesartan  300 MG tablet Commonly known as: AVAPRO  Take 1 tablet (300 mg total) by mouth daily.   isosorbide  mononitrate 120 MG 24 hr tablet Commonly known as: IMDUR  Take 120 mg by mouth every evening.   labetalol  300 MG tablet Commonly known as: NORMODYNE  Take 300 mg by mouth 2 (two) times daily.   lidocaine  4 % Place 1 patch onto the skin at bedtime as needed (back pain.). Remove & Discard patch within 12 hours or as directed by MD. Apply to lower back in the night and remove at the morning.   melatonin 3 MG Tabs tablet Take 3 mg by mouth at bedtime.   pantoprazole  40 MG tablet Commonly known as: PROTONIX  Take 1 tablet (40 mg total) by mouth daily.   polyethylene glycol 17 g packet Commonly known as: MIRALAX  / GLYCOLAX  Take 17 g by mouth in the morning. Scheduled   polyethylene glycol 17 g packet Commonly known as: MIRALAX  / GLYCOLAX  Take 17 g by mouth daily as needed.   sennosides-docusate sodium  8.6-50 MG tablet Commonly known as: SENOKOT-S Take 1 tablet by mouth daily.   sevelamer  carbonate 800 MG tablet Commonly known as: RENVELA  Take 1,600 mg by mouth 3 (three) times daily with meals.   traZODone  50 MG tablet Commonly known as: DESYREL  Take 50 mg by mouth at bedtime.   Voltaren  1 % Gel Generic drug: diclofenac   Sodium Apply 2 g topically 2 (two) times  daily as needed (shoulder pain.).         Disposition:   Discharged Condition: Allison Hill has met maximum benefit of inpatient care and is medically stable and cleared for discharge.     Lonell Moose, AGNP  Pulmonary/Critical Care Pager [phone] (please enter 7 digits) PCCM Consult Pager [phone] (please enter 7 digits)

## 2024-06-18 NOTE — ED Notes (Signed)
 Educated patient on using call bell to ask for assistance getting to the toilet. Patient agrees to use call bell. Bed rails raise, call bell within reach. Patient denies any concerns at this time.

## 2024-06-19 DIAGNOSIS — I639 Cerebral infarction, unspecified: Secondary | ICD-10-CM

## 2024-06-19 DIAGNOSIS — I161 Hypertensive emergency: Secondary | ICD-10-CM | POA: Diagnosis not present

## 2024-06-19 NOTE — Progress Notes (Signed)
 " Progress Note   Patient: Allison Hill FMW:[id number] DOB: [birth year] DOA: 06/15/2024     4 DOS: the patient was seen and examined on 06/19/2024   Brief hospital course: From HPI Allison Hill is a 54 year old female with a medical history significant for ESRD on hemodialysis, hypertension, a previous stroke and congestive heart failure who presented to [facility] ED for evaluation of an acute onset headache with slurred speech and vision changes. A code stroke was called upon arrival.    History is gathered per chart review and evaluation at bedside The patient reported that she received her normal dialysis session completed at noon and later around 6:30pm developed a severe headache with acute onset. At that time she also noticed blurred vision with slurred speech. She denied any nausea or vomiting. Upon EMS arrival, her glucose was 99 with a blood pressure of 179/100s. Per patient, she is supposed to take aspirin  at home but does not. She denied recent surgery or bleeding of any kind.    ED course: Upon arrival to the ED, she was found to have left sided facial droop, left sided extremity weakness and LUE ataxia. Initial NIHSS was 9. Neurology was consulted for evaluation.  A CT Head Code Stroke was completed. Due to hypertension, she received 40mg  of IV labetalol  and started on a cleviprex  infusion prior to TNK administration. She was later taken for a Cta of the head and neck.    Vitals on Arrival: temp: 98.9, BP 179/115, HR 94, RR 18, SpO2 99 on room air   Pertinent Labs/Diagnostics: Upon arrival she was found to have very mild hypokalemia with acute on chronic renal failure. Other lab results unremarkable   I, Robet Kim, PA-C personally viewed and interpreted this ECG. EKG Interpretation: Date: 06/16/24, EKG Time: 0046, Rate: 87, Rhythm: sinus rhythm, QRS Axis:  lvh with RAE, anterior Q waves Intervals: prolonged pr interval, prolonged QT interval ST/T Wave abnormalities: no acute ST  elevation, Narrative Interpretation: abnormal ECG with prolonged Qtc, RAE and LVH   Other hospital course as noted below   06/15/14: Presented to [facility] with acute headache with slurred speech and vision changes. Code stroke called, CT Head completed, given labetalol  and started on a cleviprex  gtt prior to TNK administration. 06/16/24- patient clinically doing well, she had TTE today and it looked reassuring to me.  She has repeat neuroimaging tommorow.  She has HD tommorow.  06/17/24- with accelerated HTN this am on exam with anasarca.  06/18/24- patient with accelerated HTN again this am,  [facility] is [facility] and we are unable to dc her today.  TOC consult is in order.  Patient completed PT/OT.  Will move to progressive and transfer to TRH.    Patient was transferred to [facility] service on 06/19/2024  Assessment and Plan:  #Suspected Stroke ~ Ruled out 06/15/24 CTH: no acute intracranial abnormality; age related cerebral atrophy with moderate chronic microvascular disease with multiple remote lacunar infarcts 06/15/24 CT angio head/neck/perfusion: Negative CTA for large vessel occlusion or other emergent finding. Mild atheromatous change about the carotid siphons without hemodynamically significant stenosis. Diffuse tortuosity of the major arterial vasculature of the head and neck, suggesting chronic underlying hypertension. -Echo showed EF 60 to 65% no shunt detected Continue PT OT Continue aspirin  and statin therapy - STAT CT head if acute neuro changes develop - Ensure euglycemia - Avoid hyperthermia; tylenol  prn - Neurology following, appreciate input   #Hypertension - Continuous cardiac monitoring - Ensure MAP >65 ~  not requiring vasopressors - BP goal <180/105 Continue current blood pressure medications, including amlodipine , hydralazine , Imdur  and irbesartan    #Hypokalemia #ESRD on HD Nephrologist on board for dialysis needs   #Transaminitis - Monitor hepatic function - RUQ US :  negative for acute abnormality    Subjective:  Patient seen and examined at bedside this morning Have been seen by PT OT recommending SNF Denies nausea vomiting or chest pain  Physical Exam:  General: Middle-age female laying in bed in no acute distress CV: RRR, S1 S2, NSR on monitor, no r/m/g Pulm: Regular, non labored on room air , breath sounds equal throughout GI: soft, non-tender, non-distended, no rebound/guarding, bs x 4 Extremities: warm/dry, pulses + 2 R/P, no edema noted Neuro: A&O x 4, follows commands, right sided facial droop with LLE weakness and mild LUE ataxia, diminished sensation on LLE, PERRL GU: deferred  Data Reviewed:    Latest Ref Rng & Units 06/18/2024    4:16 AM 06/17/2024    8:04 AM 06/16/2024    3:04 AM  CBC  WBC 4.0 - 10.5 K/uL 5.4  6.0  6.6   Hemoglobin 12.0 - 15.0 g/dL 89.1  88.8  88.6   Hematocrit 36.0 - 46.0 % 34.0  34.4  35.7   Platelets 150 - 400 K/uL 148  173  160        Latest Ref Rng & Units 06/18/2024    4:16 AM 06/17/2024    4:51 AM 06/16/2024    3:04 AM  BMP  Glucose 70 - 99 mg/dL 93  885  85   BUN 6 - 20 mg/dL 31  47  38   Creatinine 0.44 - 1.00 mg/dL 5.36  3.53  4.81   Sodium 135 - 145 mmol/L 137  138  140   Potassium 3.5 - 5.1 mmol/L 3.3  5.0  3.4   Chloride 98 - 111 mmol/L 97  102  101   CO2 22 - 32 mmol/L 27  23  26    Calcium  8.9 - 10.3 mg/dL 8.3  8.0  8.1      Vitals:   06/19/24 0500 06/19/24 0814 06/19/24 1219 06/19/24 1622  BP:  (!) 147/92 130/85 (!) 137/90  Pulse:  80 80 88  Resp:  16 12 16   Temp:  98.3 F (36.8 C) 98.3 F (36.8 C) 99 F (37.2 C)  TempSrc:    Oral  SpO2:  99% 97% 100%  Weight: 78.1 kg     Height:         Author: Drue ONEIDA Potter, MD 06/19/2024 4:30 PM  For on call review [email or web address].  "

## 2024-06-19 NOTE — Plan of Care (Signed)

## 2024-06-19 NOTE — Progress Notes (Signed)
 " [facility]  PROGRESS NOTE   Subjective:   Patient seen at bedside.  Comfortable today but  Objective:  Vital signs: Blood pressure 130/85, pulse 80, temperature 98.3 F (36.8 C), resp. rate 12, height 5' 3 (1.6 m), weight 78.1 kg, SpO2 97%.  Intake/Output Summary (Last 24 hours) at 06/19/2024 1515 Last data filed at 06/19/2024 0800 Gross per 24 hour  Intake 360 ml  Output --  Net 360 ml   Filed Weights   06/15/24 1923 06/17/24 0917 06/19/24 0500  Weight: 81.6 kg 78.1 kg 78.1 kg     Physical Exam: General:  No acute distress  Head:  Normocephalic, atraumatic. Moist oral mucosal membranes  Eyes:  Anicteric  Neck:  Supple  Lungs:   Clear to auscultation, normal effort  Heart:  S1S2 no rubs  Abdomen:   Soft, nontender, bowel sounds present  Extremities:  peripheral edema.  Neurologic:  Awake, alert, following commands  Skin:  No lesions  Access:     Basic Metabolic Panel: Recent Labs  Lab 06/15/24 1943 06/16/24 0304 06/17/24 0451 06/18/24 0416  NA 141 140 138 137  K 3.4* 3.4* 5.0 3.3*  CL 99 101 102 97*  CO2 27 26 23 27   GLUCOSE 100* 85 114* 93  BUN 37* 38* 47* 31*  CREATININE 4.86* 5.18* 6.46* 4.63*  CALCIUM  8.5* 8.1* 8.0* 8.3*  MG  --  2.4 2.6*  --   PHOS  --  2.9 3.7  --    GFR: Estimated Creatinine Clearance: 14.1 mL/min (A) (by C-G formula based on SCr of 4.63 mg/dL (H)).  Liver Function Tests: Recent Labs  Lab 06/15/24 1943 06/16/24 0304  AST 22 17  ALT 6 <5  ALKPHOS 384* 347*  BILITOT 0.4 0.4  PROT 8.2* 7.3  ALBUMIN 4.8 4.2   No results for input(s): LIPASE, AMYLASE in the last 168 hours. No results for input(s): AMMONIA in the last 168 hours.  CBC: Recent Labs  Lab 06/15/24 1943 06/16/24 0304 06/17/24 0804 06/18/24 0416  WBC 8.1 6.6 6.0 5.4  NEUTROABS 5.7  --   --   --   HGB 12.2 11.3* 11.1* 10.8*  HCT 38.3 35.7* 34.4* 34.0*  MCV 81.5 81.9 81.1 81.5  PLT 179 160 173 148*     HbA1C: Hgb A1c MFr Bld   Date/Time Value Ref Range Status  06/16/2024 03:04 AM <4.2 (L) 4.8 - 5.6 % Final  03/07/2024 08:24 PM 5.8 (H) 4.8 - 5.6 % Final    Comment:    (NOTE) Diagnosis of Diabetes The following HbA1c ranges recommended by the American Diabetes Association (ADA) may be used as an aid in the diagnosis of diabetes mellitus.  Hemoglobin             Suggested A1C NGSP%              Diagnosis  <5.7                   Non Diabetic  5.7-6.4                Pre-Diabetic  >6.4                   Diabetic  <7.0                   Glycemic control for                       adults  with diabetes.      Urinalysis: No results for input(s): COLORURINE, LABSPEC, PHURINE, GLUCOSEU, HGBUR, BILIRUBINUR, KETONESUR, PROTEINUR, UROBILINOGEN, NITRITE, LEUKOCYTESUR in the last 72 hours.  Invalid input(s): APPERANCEUR    Imaging: No results found.   Medications:      stroke: early stages of recovery book   Does not apply Once   amLODipine   10 mg Oral Daily   aspirin  EC  81 mg Oral Daily   atorvastatin   40 mg Oral QPM   Chlorhexidine  Gluconate Cloth  6 each Topical Q0600   cinacalcet   120 mg Oral Q breakfast   citalopram   10 mg Oral Daily   cloNIDine   0.3 mg Oral Daily   gabapentin   100 mg Oral TID   heparin  injection (subcutaneous)  5,000 Units Subcutaneous Q8H   hydrALAZINE   100 mg Oral Q6H   irbesartan   300 mg Oral Daily   isosorbide  mononitrate  120 mg Oral QPM   labetalol   300 mg Oral BID   melatonin  10 mg Oral QHS   pantoprazole   40 mg Oral Daily   sevelamer  carbonate  1,600 mg Oral TID WC   traZODone   100 mg Oral QHS    Assessment/ Plan:     54 y.o. female with past medical history of ESRD on HD, hypertension, congestive heart failure, migraine headaches, history of intracranial hemorrhage, cocaine abuse, sickle cell trait who was seen for Slurred speech and hypertensive urgency   #1: ESRD: Patient had stable dialysis on Friday. Tolerated 1L fluid removal.   Will schedule for dialysis tomorrow   #2: Hypertensive urgency: Blood pressure significantly improved.  Presently on amlodipine , clonidine , hydralazine , labetalol  and irbesartan .   #3: Anemia: Anemia secondary to chronic kidney disease: Will continue to monitor closely.   #4: Second hyperparathyroidism: PTH levels from last year were 1248, calcium  was 8.0 phosphorus 3.7.   Will continue to monitor closely.  Labs and medications reviewed. Will continue to follow along with you.   LOS: 4 Mako Pelfrey, MD [facility] 1/18/20263:15 PM  "

## 2024-06-20 DIAGNOSIS — I161 Hypertensive emergency: Secondary | ICD-10-CM | POA: Diagnosis not present

## 2024-06-20 LAB — CBC WITH DIFFERENTIAL/PLATELET
Abs Immature Granulocytes: 0.01 K/uL (ref 0.00–0.07)
Basophils Absolute: 0 K/uL (ref 0.0–0.1)
Basophils Relative: 1 %
Eosinophils Absolute: 0.1 K/uL (ref 0.0–0.5)
Eosinophils Relative: 2 %
HCT: 34.8 % — ABNORMAL LOW (ref 36.0–46.0)
Hemoglobin: 11.4 g/dL — ABNORMAL LOW (ref 12.0–15.0)
Immature Granulocytes: 0 %
Lymphocytes Relative: 38 %
Lymphs Abs: 1.4 K/uL (ref 0.7–4.0)
MCH: 26.1 pg (ref 26.0–34.0)
MCHC: 32.8 g/dL (ref 30.0–36.0)
MCV: 79.6 fL — ABNORMAL LOW (ref 80.0–100.0)
Monocytes Absolute: 0.5 K/uL (ref 0.1–1.0)
Monocytes Relative: 13 %
Neutro Abs: 1.7 K/uL (ref 1.7–7.7)
Neutrophils Relative %: 46 %
Platelets: 158 K/uL (ref 150–400)
RBC: 4.37 MIL/uL (ref 3.87–5.11)
RDW: 17.9 % — ABNORMAL HIGH (ref 11.5–15.5)
WBC: 3.7 K/uL — ABNORMAL LOW (ref 4.0–10.5)
nRBC: 0 % (ref 0.0–0.2)

## 2024-06-20 LAB — BASIC METABOLIC PANEL WITH GFR
Anion gap: 17 — ABNORMAL HIGH (ref 5–15)
BUN: 55 mg/dL — ABNORMAL HIGH (ref 6–20)
CO2: 25 mmol/L (ref 22–32)
Calcium: 7.5 mg/dL — ABNORMAL LOW (ref 8.9–10.3)
Chloride: 98 mmol/L (ref 98–111)
Creatinine, Ser: 7.56 mg/dL — ABNORMAL HIGH (ref 0.44–1.00)
GFR, Estimated: 6 mL/min — ABNORMAL LOW
Glucose, Bld: 91 mg/dL (ref 70–99)
Potassium: 3.5 mmol/L (ref 3.5–5.1)
Sodium: 139 mmol/L (ref 135–145)

## 2024-06-20 MED ORDER — HEPARIN SODIUM (PORCINE) 1000 UNIT/ML IJ SOLN
INTRAMUSCULAR | Status: AC
  Filled 2024-06-20: qty 4

## 2024-06-20 MED ORDER — GABAPENTIN 100 MG PO CAPS: 100.0000 mg | ORAL_CAPSULE | Freq: Three times a day (TID) | ORAL | Status: AC

## 2024-06-20 NOTE — Progress Notes (Signed)
 Mobility Specialist Progress Note:    06/20/24 1327  Mobility  Activity Ambulated with assistance  Level of Assistance Contact guard assist, steadying assist  Assistive Device Front wheel walker  Distance Ambulated (ft) 24 ft  Range of Motion/Exercises Active;All extremities  Activity Response Tolerated well  Mobility visit 1 Mobility  Mobility Specialist Start Time (ACUTE ONLY) 1320  Mobility Specialist Stop Time (ACUTE ONLY) 1336  Mobility Specialist Time Calculation (min) (ACUTE ONLY) 16 min   Pt received requesting assistance, required CGA to stand and ambulate with RW. Tolerated well, c/o abdominal cramping; RN aware. Returned pt fowlers, alarm on and all needs met.  Sherrilee Ditty Mobility Specialist Please contact via Special Educational Needs Teacher or  Rehab office at [phone]

## 2024-06-20 NOTE — Discharge Summary (Addendum)
 " Physician Discharge Summary   Patient: Allison Hill MRN: [id number] DOB: Jan 06, 1971  Admit date:     06/15/2024  Discharge date: 06/20/24  Discharge Physician: Drue ONEIDA Potter   PCP: Patient, No Pcp Per   Recommendations at discharge:  Follow-up with nephrologist and PCP  Discharge Diagnoses: Principal Problem:   Stroke [facility])  Resolved Problems:   * No resolved hospital problems. [facility] Course:  From HPI Allison Hill is a 54 year old female with a medical history significant for ESRD on hemodialysis, hypertension, a previous stroke and congestive heart failure who presented to [facility] ED for evaluation of an acute onset headache with slurred speech and vision changes. A code stroke was called upon arrival.  The patient reported that she received her normal dialysis session completed at noon and later around 6:30pm developed a severe headache with acute onset. At that time she also noticed blurred vision with slurred speech. She denied any nausea or vomiting. Upon EMS arrival, her glucose was 99 with a blood pressure of 179/100s. Per patient, she is supposed to take aspirin  at home but does not. She denied recent surgery or bleeding of any kind.    ED course: Upon arrival to the ED, she was found to have left sided facial droop, left sided extremity weakness and LUE ataxia. Initial NIHSS was 9. Neurology was consulted for evaluation.  A CT Head Code Stroke was completed. Due to hypertension, she received 40mg  of IV labetalol  and started on a cleviprex  infusion prior to TNK administration. She was later taken for a Cta of the head and neck.    Vitals on Arrival: temp: 98.9, BP 179/115, HR 94, RR 18, SpO2 99 on room air   Pertinent Labs/Diagnostics: Upon arrival she was found to have very mild hypokalemia with acute on chronic renal failure. Other lab results unremarkable   Other hospital course as noted below     06/15/14: Presented to [facility] with acute headache with slurred  speech and vision changes. Code stroke called, CT Head completed, given labetalol  and started on a cleviprex  gtt prior to TNK administration. 06/16/24- patient clinically doing well, she had TTE today and it looked reassuring to me.  She has repeat neuroimaging tommorow.  She has HD tommorow.  06/17/24- with accelerated HTN this am on exam with anasarca.  06/18/24- patient with accelerated HTN again this am,  [facility] is [facility] and we are unable to dc her today.  TOC consult is in order.  Patient completed PT/OT.  Will move to progressive and transfer to [facility].  06/19/2024: Patient was transferred from [facility] to [facility] service and doing well working with physical therapy 06/19/2024: Patient has been cleared for discharge today and will follow-up with neurology, PCP     Patient was transferred to [facility] service on 06/19/2024   Assessment and Plan:  #Suspected Stroke ~ Ruled out 06/15/24 CTH: no acute intracranial abnormality; age related cerebral atrophy with moderate chronic microvascular disease with multiple remote lacunar infarcts 06/15/24 CT angio head/neck/perfusion: Negative CTA for large vessel occlusion or other emergent finding. Mild atheromatous change about the carotid siphons without hemodynamically significant stenosis. Diffuse tortuosity of the major arterial vasculature of the head and neck, suggesting chronic underlying hypertension. -Echo showed EF 60 to 65% no shunt detected Continue PT OT Continue aspirin  and statin therapy - Avoid hyperthermia; tylenol  prn - Neurology following, & as MRI came back negative for stroke neurology thinks this is recrudescence of previous stroke in the setting  of hypertensive emergency.   #Hypertension Blood pressure improved Continue current blood pressure medications, including amlodipine , hydralazine , Imdur  and irbesartan    #Hypokalemia #ESRD on HD Continue hemodialysis schedule   #Transaminitis - Monitor hepatic function - RUQ US : negative for acute  abnormality  Consultants: Nephrology Procedures performed: Hemodialysis Disposition: Skilled nursing facility Diet recommendation:  Renal diet DISCHARGE MEDICATION: Allergies as of 06/20/2024       Reactions   Ceftriaxone Shortness Of Breath, Hives   Allergy, hives   Shellfish Allergy Hives   Minoxidil Swelling        Medication List     STOP taking these medications    cloNIDine  0.3 mg/24hr patch Commonly known as: CATAPRES  - Dosed in mg/24 hr       TAKE these medications    acetaminophen  500 MG tablet Commonly known as: TYLENOL  Take 500 mg by mouth 3 (three) times daily.   amLODipine  10 MG tablet Commonly known as: NORVASC  Take 1 tablet (10 mg total) by mouth daily.   ARTIFICIAL TEARS PF OP Apply to eye.   aspirin  EC 81 MG tablet Take 1 tablet (81 mg total) by mouth daily. Swallow whole. What changed:  medication strength how much to take additional instructions   atorvastatin  40 MG tablet Commonly known as: LIPITOR Take 40 mg by mouth every evening.   calcitRIOL  0.25 MCG capsule Commonly known as: ROCALTROL  Take 1 capsule (0.25 mcg total) by mouth 4 (four) times a week. Take one capsule by mouth every Mon, Tue, Wed, Fri with dialysis What changed:  how much to take when to take this additional instructions   calcium  carbonate 500 MG chewable tablet Commonly known as: TUMS - dosed in mg elemental calcium  Chew 2 tablets (400 mg of elemental calcium  total) by mouth 2 (two) times daily. What changed:  when to take this reasons to take this   cinacalcet  60 MG tablet Commonly known as: SENSIPAR  Take 120 mg by mouth in the morning.   citalopram  10 MG tablet Commonly known as: CELEXA  Take 10 mg by mouth daily.   cloNIDine  0.3 MG tablet Commonly known as: CATAPRES  Take 1 tablet (0.3 mg total) by mouth daily.   doxycycline 50 MG capsule Commonly known as: VIBRAMYCIN Take 50 mg by mouth 2 (two) times daily.   epoetin  alfa 4000 UNIT/ML  injection Commonly known as: EPOGEN  Inject 1 mL (4,000 Units total) into the vein Every Tuesday,Thursday,and Saturday with dialysis. What changed:  when to take this additional instructions   fluticasone  50 MCG/ACT nasal spray Commonly known as: FLONASE  Place 1 spray into both nostrils daily as needed for allergies or rhinitis.   gabapentin  100 MG capsule Commonly known as: NEURONTIN  Take 1 capsule (100 mg total) by mouth 3 (three) times daily. What changed: how much to take   hydrALAZINE  100 MG tablet Commonly known as: APRESOLINE  Take 100 mg by mouth 3 (three) times daily.   indapamide 1.25 MG tablet Commonly known as: LOZOL Take 1.25 mg by mouth daily at 12 noon.   irbesartan  300 MG tablet Commonly known as: AVAPRO  Take 1 tablet (300 mg total) by mouth daily.   isosorbide  mononitrate 120 MG 24 hr tablet Commonly known as: IMDUR  Take 120 mg by mouth every evening.   labetalol  300 MG tablet Commonly known as: NORMODYNE  Take 300 mg by mouth 2 (two) times daily.   lidocaine  4 % Place 1 patch onto the skin at bedtime as needed (back pain.). Remove & Discard patch within 12 hours or  as directed by MD. Apply to lower back in the night and remove at the morning.   melatonin 3 MG Tabs tablet Take 3 mg by mouth at bedtime.   pantoprazole  40 MG tablet Commonly known as: PROTONIX  Take 1 tablet (40 mg total) by mouth daily.   polyethylene glycol 17 g packet Commonly known as: MIRALAX  / GLYCOLAX  Take 17 g by mouth in the morning. Scheduled   polyethylene glycol 17 g packet Commonly known as: MIRALAX  / GLYCOLAX  Take 17 g by mouth daily as needed.   sennosides-docusate sodium  8.6-50 MG tablet Commonly known as: SENOKOT-S Take 1 tablet by mouth daily.   sevelamer  carbonate 800 MG tablet Commonly known as: RENVELA  Take 1,600 mg by mouth 3 (three) times daily with meals.   traZODone  50 MG tablet Commonly known as: DESYREL  Take 50 mg by mouth at bedtime.   Voltaren  1  % Gel Generic drug: diclofenac  Sodium Apply 2 g topically 2 (two) times daily as needed (shoulder pain.).        Contact information for follow-up providers     Maree Jannett POUR, MD Follow up.   Specialty: Neurology Contact information: [address] [facility] [place] [address] [phone]              Contact information for after-discharge care     Destination     [facility] .   Service: Skilled Nursing Contact information: [address] [place]  [address] [phone]                    Discharge Exam: Filed Weights   06/20/24 0700 06/20/24 0732 06/20/24 1113  Weight: 78.4 kg 74.7 kg 73.7 kg   General: Middle-age female laying in bed in no acute distress CV: RRR, S1 S2, NSR on monitor, no r/m/g Pulm: Regular, non labored on room air , breath sounds equal throughout GI: soft, non-tender, non-distended, no rebound/guarding, bs x 4 Extremities: warm/dry, pulses + 2 R/P, no edema noted Neuro: A&O x 4, follows commands, right sided facial droop with LLE weakness and mild LUE ataxia, diminished sensation on LLE, PERRL GU: deferred  Condition at discharge: good  The results of significant diagnostics from this hospitalization (including imaging, microbiology, ancillary and laboratory) are listed below for reference.   Imaging Studies: MR BRAIN WO CONTRAST Result Date: 06/16/2024 EXAM: MRI BRAIN WITHOUT CONTRAST 06/16/2024 06:09:36 PM TECHNIQUE: Multiplanar multisequence MRI of the head/brain was performed without the administration of intravenous contrast. COMPARISON: Head CT and CTA 06/15/2024 and MRI 03/08/2024. CLINICAL HISTORY: Stroke, follow up. Headache, dizziness, and imbalance. History of stroke. FINDINGS: The examination is intermittently up to moderately motion degraded. BRAIN AND VENTRICLES: There is no evidence of an acute infarct, mass, midline shift, hydrocephalus, or  extra-axial fluid collection. Numerous chronic microhemorrhages are again noted primarily involving the deep gray nuclei, brainstem, and cerebellum suggestive of chronic hypertension. Patchy to confluent T2 hyperintensities in the cerebral white matter bilaterally are similar to the prior MRI and nonspecific but compatible with severe chronic small vessel ischemic disease. Chronic lacunar infarcts are noted bilaterally in the deep cerebral white matter, basal ganglia, and thalami as well as pons. There are also unchanged small chronic cortical infarcts at the right temporooccipital junction and in the medial left parietal lobe. There is mild cerebral atrophy. A partially empty sella is noted. Major intracranial vascular flow voids are preserved. ORBITS: No significant abnormality. SINUSES AND MASTOIDS: Moderately large right mastoid effusion.  Clear paranasal sinuses. BONES AND SOFT TISSUES: Heterogeneous calvarial bone marrow signal and diffusely diminished bone marrow T1 signal intensity in the cervical spine, similar to the prior MRI and nonspecific but may be related to end stage renal disease and anemia. No soft tissue abnormality. IMPRESSION: 1. No acute intracranial abnormality. 2. Severe chronic small vessel ischemic disease with multiple chronic infarcts and chronic microhemorrhages. Electronically signed by: Dasie Hamburg MD 06/16/2024 06:55 PM EST RP Workstation: [id number]   ECHOCARDIOGRAM COMPLETE Result Date: 06/16/2024    ECHOCARDIOGRAM REPORT   Patient Name:   CARLYNN LEDUC Date of Exam: 06/16/2024 Medical Rec #:  [id number]        Height:       63.0 in Accession #:    [id number]       Weight:       180.0 lb Date of Birth:  1971/05/25        BSA:          1.849 m Patient Age:    53 years         BP:           143/95 mmHg Patient Gender: F                HR:           85 bpm. Exam Location:  [facility] Procedure: 2D Echo, Cardiac Doppler, Color Doppler, 3D Echo, Saline Contrast            Bubble Study and  Strain Analysis (Both Spectral and Color Flow            Doppler were utilized during procedure). Indications:     Stroke I63.9  History:         Patient has prior history of Echocardiogram examinations, most                  recent 03/08/2024. CHF and Transplant Complications, Stroke;                  Risk Factors:Hypertension.  Sonographer:     Christopher Furnace Referring Phys:  [id number] ROBET KIM Diagnosing Phys: Evalene Lunger MD  Sonographer Comments: Global longitudinal strain was attempted. IMPRESSIONS  1. Left ventricular ejection fraction, by estimation, is 60 to 65%. The left ventricle has normal function. The left ventricle has no regional wall motion abnormalities. There is mild left ventricular hypertrophy. Left ventricular diastolic parameters are consistent with Grade I diastolic dysfunction (impaired relaxation).  2. Right ventricular systolic function is normal. The right ventricular size is normal. There is normal pulmonary artery systolic pressure. The estimated right ventricular systolic pressure is 25.4 mmHg.  3. The mitral valve is normal in structure. Mild mitral valve regurgitation. No evidence of mitral stenosis.  4. The aortic valve is normal in structure. Aortic valve regurgitation is not visualized. No aortic stenosis is present.  5. The inferior vena cava is normal in size with greater than 50% respiratory variability, suggesting right atrial pressure of 3 mmHg.  6. Agitated saline contrast bubble study was negative, with no evidence of any interatrial shunt. FINDINGS  Left Ventricle: Left ventricular ejection fraction, by estimation, is 60 to 65%. The left ventricle has normal function. The left ventricle has no regional wall motion abnormalities. The global longitudinal strain is normal despite suboptimal segment tracking. The left ventricular internal cavity size was normal in size. There is mild left ventricular hypertrophy. Left ventricular diastolic parameters are consistent with Grade  I diastolic dysfunction (impaired relaxation). Right  Ventricle: The right ventricular size is normal. No increase in right ventricular wall thickness. Right ventricular systolic function is normal. There is normal pulmonary artery systolic pressure. The tricuspid regurgitant velocity is 2.26 m/s, and  with an assumed right atrial pressure of 5 mmHg, the estimated right ventricular systolic pressure is 25.4 mmHg. Left Atrium: Left atrial size was normal in size. Right Atrium: Right atrial size was normal in size. Pericardium: There is no evidence of pericardial effusion. Mitral Valve: The mitral valve is normal in structure. Mild mitral valve regurgitation. No evidence of mitral valve stenosis. MV peak gradient, 7.5 mmHg. The mean mitral valve gradient is 3.0 mmHg. Tricuspid Valve: The tricuspid valve is normal in structure. Tricuspid valve regurgitation is not demonstrated. No evidence of tricuspid stenosis. Aortic Valve: The aortic valve is normal in structure. Aortic valve regurgitation is not visualized. No aortic stenosis is present. Aortic valve mean gradient measures 9.0 mmHg. Aortic valve peak gradient measures 16.5 mmHg. Aortic valve area, by VTI measures 1.75 cm. Pulmonic Valve: The pulmonic valve was normal in structure. Pulmonic valve regurgitation is not visualized. No evidence of pulmonic stenosis. Aorta: The aortic root is normal in size and structure. Venous: The inferior vena cava is normal in size with greater than 50% respiratory variability, suggesting right atrial pressure of 3 mmHg. IAS/Shunts: No atrial level shunt detected by color flow Doppler. Agitated saline contrast was given intravenously to evaluate for intracardiac shunting. Agitated saline contrast bubble study was negative, with no evidence of any interatrial shunt. There  is no evidence of a patent foramen ovale. There is no evidence of an atrial septal defect. Additional Comments: 3D was performed not requiring image post  processing on an independent workstation and was indeterminate.  LEFT VENTRICLE PLAX 2D LVIDd:         4.40 cm   Diastology LVIDs:         2.90 cm   LV e' medial:    6.31 cm/s LV PW:         1.70 cm   LV E/e' medial:  10.4 LV IVS:        1.40 cm   LV e' lateral:   3.15 cm/s LVOT diam:     2.10 cm   LV E/e' lateral: 20.8 LV SV:         62 LV SV Index:   33        2D Longitudinal Strain LVOT Area:     3.46 cm  2D Strain GLS Avg:     -9.4 % LV IVRT:       111 msec  RIGHT VENTRICLE RV Basal diam:  2.90 cm RV Mid diam:    2.40 cm RV S prime:     15.90 cm/s TAPSE (M-mode): 2.4 cm LEFT ATRIUM             Index        RIGHT ATRIUM           Index LA diam:        3.00 cm 1.62 cm/m   RA Area:     16.30 cm LA Vol (A2C):   65.3 ml 35.32 ml/m  RA Volume:   40.90 ml  22.12 ml/m LA Vol (A4C):   72.5 ml 39.21 ml/m LA Biplane Vol: 73.4 ml 39.70 ml/m  AORTIC VALVE AV Area (Vmax):    1.91 cm AV Area (Vmean):   1.81 cm AV Area (VTI):     1.75 cm AV Vmax:  203.33 cm/s AV Vmean:          136.333 cm/s AV VTI:            0.353 m AV Peak Grad:      16.5 mmHg AV Mean Grad:      9.0 mmHg LVOT Vmax:         112.00 cm/s LVOT Vmean:        71.400 cm/s LVOT VTI:          0.178 m LVOT/AV VTI ratio: 0.50  AORTA Ao Root diam: 3.70 cm MITRAL VALVE                TRICUSPID VALVE MV Area (PHT): 4.93 cm     TR Peak grad:   20.4 mmHg MV Area VTI:   3.02 cm     TR Vmax:        226.00 cm/s MV Peak grad:  7.5 mmHg MV Mean grad:  3.0 mmHg     SHUNTS MV Vmax:       1.37 m/s     Systemic VTI:  0.18 m MV Vmean:      87.0 cm/s    Systemic Diam: 2.10 cm MV Decel Time: 154 msec MV E velocity: 65.50 cm/s MV A velocity: 101.00 cm/s MV E/A ratio:  0.65 Evalene Lunger MD Electronically signed by Evalene Lunger MD Signature Date/Time: 06/16/2024/1:26:40 PM    Final    US  Abdomen Limited RUQ (LIVER/GB) Result Date: 06/16/2024 CLINICAL DATA:  Elevated LFTs EXAM: ULTRASOUND ABDOMEN LIMITED RIGHT UPPER QUADRANT COMPARISON:  None Available. FINDINGS:  Gallbladder: No gallstones or wall thickening visualized. No sonographic Murphy sign noted by sonographer. Common bile duct: Diameter: 4.9 mm. Liver: No focal lesion identified. Within normal limits in parenchymal echogenicity. Portal vein is patent on color Doppler imaging with normal direction of blood flow towards the liver. Other: Note is made of a small echogenic right kidney consistent with the patient's known history of end-stage renal disease. IMPRESSION: No acute abnormality in the right upper quadrant. Electronically Signed   By: Oneil Devonshire M.D.   On: 06/16/2024 02:33   CT ANGIO HEAD NECK W WO CM (CODE STROKE) Result Date: 06/15/2024 CLINICAL DATA:  Initial evaluation for acute neuro deficit, stroke. Blurry vision, headache. EXAM: CT ANGIOGRAPHY HEAD AND NECK WITH AND WITHOUT CONTRAST TECHNIQUE: Multidetector CT imaging of the head and neck was performed using the standard protocol during bolus administration of intravenous contrast. Multiplanar CT image reconstructions and MIPs were obtained to evaluate the vascular anatomy. Carotid stenosis measurements (when applicable) are obtained utilizing NASCET criteria, using the distal internal carotid diameter as the denominator. RADIATION DOSE REDUCTION: This exam was performed according to the departmental dose-optimization program which includes automated exposure control, adjustment of the mA and/or kV according to patient size and/or use of iterative reconstruction technique. CONTRAST:  75mL OMNIPAQUE  IOHEXOL  350 MG/ML SOLN COMPARISON:  CT from earlier the same day as well as prior exam from 03/07/2024. FINDINGS: CTA NECK FINDINGS Aortic arch: Dense aortic arch within normal limits for caliber with standard branch pattern. Aortic atherosclerosis. No stenosis about the origin the great vessels. Right carotid system: Right common and internal carotid arteries are tortuous but patent without stenosis or dissection. Left carotid system: Left common and  internal carotid arteries are tortuous but patent without stenosis or dissection. Vertebral arteries: Both vertebral arteries patent without stenosis or dissection. Left vertebral artery slightly dominant. Skeleton: Diffuse sclerosis throughout the visualized osseous structures, likely reflecting changes of  renal osteodystrophy. No worrisome osseous lesions. Moderate spondylosis at C5-6. Other neck: No other acute finding. Right-sided central venous catheter in place. Upper chest: No other acute finding. Review of the MIP images confirms the above findings CTA HEAD FINDINGS Anterior circulation: Mild atheromatous change about the carotid siphons without hemodynamically significant stenosis. A1 segments patent bilaterally. Normal anterior communicating artery complex. Anterior cerebral arteries patent without significant stenosis. No M1 stenosis or occlusion. No proximal MCA branch occlusion or high-grade stenosis. Distal MCA branches perfused and symmetric. Posterior circulation: Mild diffuse dolichoectasia of the posterior circulation. Both V4 segments patent without stenosis. Left vertebral artery dominant. Neither PICA origin well visualized. Basilar diffusely ectatic and patent without stenosis. Superior cerebellar and posterior cerebral arteries patent bilaterally. Venous sinuses: Patent allowing for timing the contrast bolus. Anatomic variants: None significant.  No aneurysm. Review of the MIP images confirms the above findings IMPRESSION: 1. Negative CTA for large vessel occlusion or other emergent finding. 2. Mild atheromatous change about the carotid siphons without hemodynamically significant stenosis. 3. Diffuse tortuosity of the major arterial vasculature of the head and neck, suggesting chronic underlying hypertension. Aortic Atherosclerosis (ICD10-I70.0). Electronically Signed   By: Morene Hoard M.D.   On: 06/15/2024 21:11   CT HEAD CODE STROKE WO CONTRAST Result Date: 06/15/2024 CLINICAL  DATA:  Code stroke. Initial evaluation for acute neuro deficit, stroke. No other relevant history is provided. EXAM: CT HEAD WITHOUT CONTRAST TECHNIQUE: Contiguous axial images were obtained from the base of the skull through the vertex without intravenous contrast. RADIATION DOSE REDUCTION: This exam was performed according to the departmental dose-optimization program which includes automated exposure control, adjustment of the mA and/or kV according to patient size and/or use of iterative reconstruction technique. COMPARISON:  Prior exam from 03/08/2024. FINDINGS: Brain: Age-related cerebral atrophy with moderate chronic microvascular ischemic disease. Multiple scattered remote lacunar infarcts present about the deep gray nuclei. No acute intracranial hemorrhage. No acute large vessel territory infarct. No mass lesion or midline shift. No hydrocephalus or extra-axial fluid collection. Vascular: No abnormal hyperdense vessel. Scattered vascular calcifications noted within the carotid siphons. Skull: Scalp soft tissues within normal limits.  Calvarium intact. Sinuses/Orbits: Globes and orbital soft tissues demonstrate no acute finding. Visualized paranasal sinuses are clear. Small right mastoid effusion noted. Other: None. ASPECTS [facility] Stroke Program Early CT Score) - Ganglionic level infarction (caudate, lentiform nuclei, internal capsule, insula, M1-M3 cortex): 7 - Supraganglionic infarction (M4-M6 cortex): 3 Total score (0-10 with 10 being normal): 10 IMPRESSION: 1. No acute intracranial abnormality. 2. ASPECTS is 10. 3. Age-related cerebral atrophy with moderate chronic microvascular ischemic disease, with multiple remote lacunar infarcts about the deep gray nuclei. These results were communicated to Dr. Jerri at 7:49 pm on 06/15/2024 by text page via the [facility] messaging system. Electronically Signed   By: Morene Hoard M.D.   On: 06/15/2024 19:51   MM 3D SCREENING MAMMOGRAM BILATERAL BREAST Result  Date: 06/13/2024 CLINICAL DATA:  Screening. EXAM: DIGITAL SCREENING BILATERAL MAMMOGRAM WITH TOMOSYNTHESIS AND CAD TECHNIQUE: Bilateral screening digital craniocaudal and mediolateral oblique mammograms were obtained. Bilateral screening digital breast tomosynthesis was performed. The images were evaluated with computer-aided detection. COMPARISON:  None available. ACR Breast Density Category b: There are scattered areas of fibroglandular density. FINDINGS: There are no findings suspicious for malignancy. IMPRESSION: No mammographic evidence of malignancy. A result letter of this screening mammogram will be mailed directly to the patient. RECOMMENDATION: Screening mammogram in one year. (Code:[id number]) BI-RADS CATEGORY  1: Negative. Electronically Signed  By: Dina  Arceo M.D.   On: 06/13/2024 12:35    Microbiology: Results for orders placed or performed during the hospital encounter of 06/15/24  MRSA Next Gen by PCR, Nasal     Status: None   Collection Time: 06/16/24 12:44 AM   Specimen: Nasal Mucosa; Nasal Swab  Result Value Ref Range Status   MRSA by PCR Next Gen NOT DETECTED NOT DETECTED Final    Comment: (NOTE) The GeneXpert MRSA Assay (FDA approved for NASAL specimens only), is one component of a comprehensive MRSA colonization surveillance program. It is not intended to diagnose MRSA infection nor to guide or monitor treatment for MRSA infections. Test performance is not FDA approved in patients less than 84 years old. Performed at [facility], [address] [place], [place] [address]     Labs: CBC: Recent Labs  Lab 06/15/24 1943 06/16/24 0304 06/17/24 0804 06/18/24 0416 06/20/24 0511  WBC 8.1 6.6 6.0 5.4 3.7*  NEUTROABS 5.7  --   --   --  1.7  HGB 12.2 11.3* 11.1* 10.8* 11.4*  HCT 38.3 35.7* 34.4* 34.0* 34.8*  MCV 81.5 81.9 81.1 81.5 79.6*  PLT 179 160 173 148* 158   Basic Metabolic Panel: Recent Labs  Lab 06/15/24 1943 06/16/24 0304 06/17/24 0451  06/18/24 0416 06/20/24 0511  NA 141 140 138 137 139  K 3.4* 3.4* 5.0 3.3* 3.5  CL 99 101 102 97* 98  CO2 27 26 23 27 25   GLUCOSE 100* 85 114* 93 91  BUN 37* 38* 47* 31* 55*  CREATININE 4.86* 5.18* 6.46* 4.63* 7.56*  CALCIUM  8.5* 8.1* 8.0* 8.3* 7.5*  MG  --  2.4 2.6*  --   --   PHOS  --  2.9 3.7  --   --    Liver Function Tests: Recent Labs  Lab 06/15/24 1943 06/16/24 0304  AST 22 17  ALT 6 <5  ALKPHOS 384* 347*  BILITOT 0.4 0.4  PROT 8.2* 7.3  ALBUMIN 4.8 4.2   CBG: Recent Labs  Lab 06/15/24 1927  GLUCAP 99    Discharge time spent:  35 minutes.  Signed: Drue ONEIDA Potter, MD [facility] 06/20/2024 "

## 2024-06-20 NOTE — Progress Notes (Signed)
 " [facility]  PROGRESS NOTE   Subjective:   Patient seen and evaluated during dialysis   HEMODIALYSIS FLOWSHEET:  Blood Flow Rate (mL/min): 399 mL/min Arterial Pressure (mmHg): -216.35 mmHg Venous Pressure (mmHg): 199.38 mmHg TMP (mmHg): 6.66 mmHg Ultrafiltration Rate (mL/min): 771 mL/min Dialysate Flow Rate (mL/min): 300 ml/min  States she feels well, no complaints to offer.   Objective:  Vital signs: Blood pressure (!) 159/114, pulse 92, temperature 98.4 F (36.9 C), temperature source Oral, resp. rate (!) 21, height 5' 3 (1.6 m), weight 74.7 kg, SpO2 100%.  Intake/Output Summary (Last 24 hours) at 06/20/2024 1108 Last data filed at 06/20/2024 0600 Gross per 24 hour  Intake 440 ml  Output 300 ml  Net 140 ml   Filed Weights   06/20/24 0500 06/20/24 0700 06/20/24 0732  Weight: 80 kg 78.4 kg 74.7 kg     Physical Exam: General:  No acute distress  Head:  Normocephalic, atraumatic. Moist oral mucosal membranes  Eyes:  Anicteric  Lungs:   Clear to auscultation, normal effort  Heart:  S1S2 no rubs  Abdomen:   Soft, nontender, bowel sounds present  Extremities:  No peripheral edema.  Neurologic:  Awake, alert, following commands  Skin:  No lesions  Access: Rt permcath    Basic Metabolic Panel: Recent Labs  Lab 06/15/24 1943 06/16/24 0304 06/17/24 0451 06/18/24 0416 06/20/24 0511  NA 141 140 138 137 139  K 3.4* 3.4* 5.0 3.3* 3.5  CL 99 101 102 97* 98  CO2 27 26 23 27 25   GLUCOSE 100* 85 114* 93 91  BUN 37* 38* 47* 31* 55*  CREATININE 4.86* 5.18* 6.46* 4.63* 7.56*  CALCIUM  8.5* 8.1* 8.0* 8.3* 7.5*  MG  --  2.4 2.6*  --   --   PHOS  --  2.9 3.7  --   --    GFR: Estimated Creatinine Clearance: 8.4 mL/min (A) (by C-G formula based on SCr of 7.56 mg/dL (H)).  Liver Function Tests: Recent Labs  Lab 06/15/24 1943 06/16/24 0304  AST 22 17  ALT 6 <5  ALKPHOS 384* 347*  BILITOT 0.4 0.4  PROT 8.2* 7.3  ALBUMIN 4.8 4.2   No results for  input(s): LIPASE, AMYLASE in the last 168 hours. No results for input(s): AMMONIA in the last 168 hours.  CBC: Recent Labs  Lab 06/15/24 1943 06/16/24 0304 06/17/24 0804 06/18/24 0416 06/20/24 0511  WBC 8.1 6.6 6.0 5.4 3.7*  NEUTROABS 5.7  --   --   --  1.7  HGB 12.2 11.3* 11.1* 10.8* 11.4*  HCT 38.3 35.7* 34.4* 34.0* 34.8*  MCV 81.5 81.9 81.1 81.5 79.6*  PLT 179 160 173 148* 158     HbA1C: Hgb A1c MFr Bld  Date/Time Value Ref Range Status  06/16/2024 03:04 AM <4.2 (L) 4.8 - 5.6 % Final  03/07/2024 08:24 PM 5.8 (H) 4.8 - 5.6 % Final    Comment:    (NOTE) Diagnosis of Diabetes The following HbA1c ranges recommended by the American Diabetes Association (ADA) may be used as an aid in the diagnosis of diabetes mellitus.  Hemoglobin             Suggested A1C NGSP%              Diagnosis  <5.7                   Non Diabetic  5.7-6.4  Pre-Diabetic  >6.4                   Diabetic  <7.0                   Glycemic control for                       adults with diabetes.      Urinalysis: No results for input(s): COLORURINE, LABSPEC, PHURINE, GLUCOSEU, HGBUR, BILIRUBINUR, KETONESUR, PROTEINUR, UROBILINOGEN, NITRITE, LEUKOCYTESUR in the last 72 hours.  Invalid input(s): APPERANCEUR    Imaging: No results found.   Medications:      stroke: early stages of recovery book   Does not apply Once   amLODipine   10 mg Oral Daily   aspirin  EC  81 mg Oral Daily   atorvastatin   40 mg Oral QPM   Chlorhexidine  Gluconate Cloth  6 each Topical Q0600   cinacalcet   120 mg Oral Q breakfast   citalopram   10 mg Oral Daily   cloNIDine   0.3 mg Oral Daily   gabapentin   100 mg Oral TID   heparin  injection (subcutaneous)  5,000 Units Subcutaneous Q8H   hydrALAZINE   100 mg Oral Q6H   irbesartan   300 mg Oral Daily   isosorbide  mononitrate  120 mg Oral QPM   labetalol   300 mg Oral BID   melatonin  10 mg Oral QHS   pantoprazole   40 mg Oral  Daily   sevelamer  carbonate  1,600 mg Oral TID WC   traZODone   100 mg Oral QHS    Assessment/ Plan:     54 y.o. female with past medical history of ESRD on HD, hypertension, congestive heart failure, migraine headaches, history of intracranial hemorrhage, cocaine abuse, sickle cell trait who was seen for Slurred speech and hypertensive urgency  [facility] Rt permcath   #1: ESRD: Receiving dialysis today, UF 2L as tolerated. Next treatment scheduled for Wednesday.    #2: Hypertensive urgency: Presently on amlodipine , clonidine , hydralazine , labetalol  and irbesartan . Blood pressure improved with dialysis    #3: Anemia with chronic kidney disease:  Lab Results  Component Value Date   HGB 11.4 (L) 06/20/2024   Hgb stable. Will continue to monitor    #4: Second hyperparathyroidism: PTH levels from last year were 1248, calcium  was 8.0 phosphorus 3.7.  Will continue to monitor bone minerals during this admission.      LOS: 5 [facility] 1/19/202611:08 AM  "

## 2024-06-20 NOTE — Progress Notes (Signed)
 PT Cancellation Note  Patient Details Name: Allison Hill MRN: [id number] DOB: 08/01/1970   Cancelled Treatment:    Reason Eval/Treat Not Completed: Patient at procedure or test/unavailable Pt OTF at dialysis. Will f/u as able.  Richerd Pinal, PT, DPT 06/20/24, 10:01 AM   [name] 06/20/2024, 10:01 AM

## 2024-06-20 NOTE — Plan of Care (Signed)

## 2024-06-20 NOTE — Procedures (Signed)
 Post Hemodialysis Report:   Treatment Date:06/20/24  Treatment Length: 3hours  Patient Access: Right HD CVC  Treatment ended 30 minutes early due to frequent arterial pressure alarms from patient bending her right arm and kinking off lines. Otherwise access would function well at prescribed BFR. No medications with treatment. Consent in chart. Net  removal=1.6liters.  Post Vitals:  Temp: 98.8 Temp Source: oral Heart Rate: 85 Resp: 18 B/P: 835/893(876) O2: 100% Room air

## 2024-06-20 NOTE — Care Management Important Message (Signed)
 Important Message  Patient Details  Name: Allison Hill MRN: [id number] Date of Birth: 11/16/1970   Important Message Given:  Yes - Medicare IM     Allison Hill, CMA 06/20/2024, 1:47 PM

## 2024-06-20 NOTE — TOC Transition Note (Signed)
 Transition of Care [facility]) - Discharge Note   Patient Details  Name: Allison Hill MRN: [id number] Date of Birth: Feb 29, 1971  Transition of Care [facility]) CM/SW Contact:  Shasta DELENA Daring, RN Phone Number: 06/20/2024, 4:24 PM   Clinical Narrative:     Patient has discharge orders. Will return to LTC at [facility]. Confirmed bed with [facility]. Transport arranged by [employer]. Ride will be here at 5:30.  Bed number and report number given to RN.  RNCM signing off.      Barriers to Discharge: Barriers Resolved   Patient Goals and CMS Choice            Discharge Placement                  Name of family member notified: Santana, daughter.[phone] Patient and family notified of of transfer: 06/20/24  Discharge Plan and Services Additional resources added to the After Visit Summary for                                       Social Drivers of Health (SDOH) Interventions SDOH Screenings   Food Insecurity: No Food Insecurity (06/19/2024)  Housing: Low Risk (06/19/2024)  Transportation Needs: No Transportation Needs (06/19/2024)  Utilities: Not At Risk (06/19/2024)  Financial Resource Strain: Patient Declined (01/03/2023)   Received from [facility]  Social Connections: Patient Declined (03/08/2024)  Tobacco Use: Medium Risk (06/15/2024)     Readmission Risk Interventions    02/01/2024   12:28 PM  Readmission Risk Prevention Plan  Transportation Screening Complete  Medication Review (RN Care Manager) Complete  PCP or Specialist appointment within 3-5 days of discharge Complete  Palliative Care Screening Not Applicable  Skilled Nursing Facility Complete

## 2024-07-05 ENCOUNTER — Other Ambulatory Visit: Payer: Self-pay

## 2024-07-05 ENCOUNTER — Emergency Department
Admission: EM | Admit: 2024-07-05 | Discharge: 2024-07-06 | Disposition: A | Attending: Emergency Medicine | Admitting: Emergency Medicine

## 2024-07-05 ENCOUNTER — Encounter: Payer: Self-pay | Admitting: Emergency Medicine

## 2024-07-05 DIAGNOSIS — R0789 Other chest pain: Secondary | ICD-10-CM | POA: Insufficient documentation

## 2024-07-05 DIAGNOSIS — R7989 Other specified abnormal findings of blood chemistry: Secondary | ICD-10-CM | POA: Insufficient documentation

## 2024-07-05 DIAGNOSIS — R079 Chest pain, unspecified: Secondary | ICD-10-CM

## 2024-07-05 DIAGNOSIS — N186 End stage renal disease: Secondary | ICD-10-CM | POA: Insufficient documentation

## 2024-07-05 DIAGNOSIS — Z992 Dependence on renal dialysis: Secondary | ICD-10-CM | POA: Insufficient documentation

## 2024-07-05 DIAGNOSIS — I509 Heart failure, unspecified: Secondary | ICD-10-CM | POA: Insufficient documentation

## 2024-07-05 DIAGNOSIS — I132 Hypertensive heart and chronic kidney disease with heart failure and with stage 5 chronic kidney disease, or end stage renal disease: Secondary | ICD-10-CM | POA: Insufficient documentation

## 2024-07-05 DIAGNOSIS — Z8673 Personal history of transient ischemic attack (TIA), and cerebral infarction without residual deficits: Secondary | ICD-10-CM | POA: Insufficient documentation

## 2024-07-05 LAB — BASIC METABOLIC PANEL WITH GFR
Anion gap: 15 (ref 5–15)
BUN: 34 mg/dL — ABNORMAL HIGH (ref 6–20)
CO2: 26 mmol/L (ref 22–32)
Calcium: 8.2 mg/dL — ABNORMAL LOW (ref 8.9–10.3)
Chloride: 100 mmol/L (ref 98–111)
Creatinine, Ser: 4.68 mg/dL — ABNORMAL HIGH (ref 0.44–1.00)
GFR, Estimated: 11 mL/min — ABNORMAL LOW
Glucose, Bld: 103 mg/dL — ABNORMAL HIGH (ref 70–99)
Potassium: 3.3 mmol/L — ABNORMAL LOW (ref 3.5–5.1)
Sodium: 140 mmol/L (ref 135–145)

## 2024-07-05 LAB — CBC
HCT: 33 % — ABNORMAL LOW (ref 36.0–46.0)
Hemoglobin: 10.9 g/dL — ABNORMAL LOW (ref 12.0–15.0)
MCH: 26 pg (ref 26.0–34.0)
MCHC: 33 g/dL (ref 30.0–36.0)
MCV: 78.6 fL — ABNORMAL LOW (ref 80.0–100.0)
Platelets: 139 10*3/uL — ABNORMAL LOW (ref 150–400)
RBC: 4.2 MIL/uL (ref 3.87–5.11)
RDW: 17.5 % — ABNORMAL HIGH (ref 11.5–15.5)
WBC: 4.3 10*3/uL (ref 4.0–10.5)
nRBC: 0 % (ref 0.0–0.2)

## 2024-07-05 LAB — TROPONIN T, HIGH SENSITIVITY
Troponin T High Sensitivity: 125 ng/L (ref 0–19)
Troponin T High Sensitivity: 116 ng/L (ref 0–19)

## 2024-07-05 MED ORDER — OXYCODONE-ACETAMINOPHEN 5-325 MG PO TABS
1.0000 | ORAL_TABLET | Freq: Once | ORAL | Status: AC
  Administered 2024-07-05: 1 via ORAL
  Filled 2024-07-05: qty 1

## 2024-07-05 NOTE — ED Provider Notes (Signed)
 "  [facility] Provider Note    Event Date/Time   First MD Initiated Contact with Patient 07/05/24 1958     (approximate)  History   Chief Complaint: Chest Pain  HPI  Allison Hill is a 54 y.o. female with a past medical history of CHF, hypertension, ESRD on HD, prior CVA, presents to the emergency department for chest pain.  According to the patient she just finished her dialysis session this evening when she developed chest pain.  Patient states she has a history of CHF and often will get chest pain but it seemed worse tonight.  Patient denies any shortness of breath denies any fever or cough does state some mild congestion.  Physical Exam   Triage Vital Signs: ED Triage Vitals  Encounter Vitals Group     BP 07/05/24 1944 (!) 137/90     Girls Systolic BP Percentile --      Girls Diastolic BP Percentile --      Boys Systolic BP Percentile --      Boys Diastolic BP Percentile --      Pulse Rate 07/05/24 1944 88     Resp 07/05/24 1944 17     Temp 07/05/24 1944 98.5 F (36.9 C)     Temp Source 07/05/24 1944 Oral     SpO2 07/05/24 1944 99 %     Weight 07/05/24 1945 169 lb 12.1 oz (77 kg)     Height 07/05/24 1945 5' 3 (1.6 m)     Head Circumference --      Peak Flow --      Pain Score 07/05/24 1945 5     Pain Loc --      Pain Education --      Exclude from Growth Chart --     Most recent vital signs: Vitals:   07/05/24 1944  BP: (!) 137/90  Pulse: 88  Resp: 17  Temp: 98.5 F (36.9 C)  SpO2: 99%    General: Awake, no distress.  Patient has tissue paper in her left nostril states she will get some mild nosebleeding due to dry heat at her facility.  States this is common/frequent for her. CV:  Good peripheral perfusion.  Regular rate and rhythm  Resp:  Normal effort.  Equal breath sounds bilaterally.  Abd:  No distention.  Soft, nontender.  No rebound or guarding.  ED Results / Procedures / Treatments   EKG  EKG viewed and interpreted  by myself shows a normal sinus rhythm at 86 bpm with a narrow QRS, normal axis, slight QTc prolongation otherwise normal intervals.  Patient does have lateral T wave inversions.  EKG unchanged from prior 06/16/2024  MEDICATIONS ORDERED IN ED: Medications  oxyCODONE -acetaminophen  (PERCOCET/ROXICET) 5-325 MG per tablet 1 tablet (has no administration in time range)    IMPRESSION / MDM / ASSESSMENT AND PLAN / ED COURSE  I reviewed the triage vital signs and the nursing notes.  Patient's presentation is most consistent with acute presentation with potential threat to life or bodily function.  Patient presents to the emergency department for chest pain that started shortly after her dialysis session today.  Overall the patient appears well, no distress.  States very minimal chest discomfort currently.  She states however she gets chest pain very frequently.  Vital signs reassuring.  We will check labs.  EKG shows no significant change from prior.  Will obtain a chest x-ray and continue to closely monitor.  Patient's lab work has resulted showing  a CBC with no significant findings, chemistry reassuring given the patient's ESRD status.  Patient's troponin is elevated to 125.  Patient does not have any old troponin T's for comparison but does have older troponin I's that have been elevated to the 40-50 range chronically in the past.  Patient states she is feeling much better.  Will repeat a troponin.  If the troponin is not significantly changed and I believe the patient can be discharged home.  If the troponin is elevating patient will require admission for further workup and treatment.  FINAL CLINICAL IMPRESSION(S) / ED DIAGNOSES   Chest pain   Note:  This document was prepared using Dragon voice recognition software and may include unintentional dictation errors.   Dorothyann Drivers, MD 07/05/24 2255  "

## 2024-07-06 NOTE — ED Provider Notes (Signed)
 Emergency department handoff note  Care of this patient was signed out to me at the end of the previous provider shift.  All pertinent patient information was conveyed and all questions were answered.  Patient pending repeat troponin which is downtrending.  I spoke to patient at length about the possibility of admission for stress testing however patient states that she would prefer being discharged at this time with outpatient cardiology follow-up.  Patient states that she has seen a cardiologist years ago but does not have one currently despite her history of heart failure.  Patient given an ambulatory referral for cardiology.  Patient given strict return precautions and all questions were answered prior to discharge  Dispo: Discharge with outpatient cardiology follow-up   Alyissa Whidbee K, MD 07/06/24 [phone]

## 2024-07-07 NOTE — Progress Notes (Unsigned)
" °  Cardiology Office Note   Date:  07/07/2024  ID:  Allison Hill, DOB 04-14-1971, MRN [id number] PCP: Pcp, No  [facility] Providers Cardiologist:  None { Click to update primary MD,subspecialty MD or APP then REFRESH:1}    History of Present Illness Allison Hill is a 54 y.o. female PMH HTN, HLD, ESRD on dialysis who presents for further evaluation management of chest discomfort.  Patient seen in the ED for this issue 07/05/2024.  Reportedly developed chest pain during dialysis.  Troponin 125-116.  Troponin was 41 during a prior check in 02/2024.  CBC, BMP unremarkable with her known chronic anemia and ESRD.  Last LDL 44 06/2024.  Relevant CVD History - CT chest 01/2019 5 aortic atherosclerosis and coronary artery calcifications - TTE 06/2024 normal biventricular function, grade 1 diastolic dysfunction, mild MR, PASP 25 - TTE 03/2024 normal biventricular function, no significant valvular disease - TTE OSH 07/2023 LVEF 45 to 50% with strain pattern that apparently showed apical sparing, trivial effusion, possible amyloid - SPECT 04/2023 normal perfusion   ROS: Pt denies any chest discomfort, jaw pain, arm pain, palpitations, syncope, presyncope, orthopnea, PND, or LE edema.  Studies Reviewed I have independently reviewed the patient's ECG, previous cardiac testing, previous medical records, previous blood work.  Physical Exam VS:  There were no vitals taken for this visit.       Wt Readings from Last 3 Encounters:  07/05/24 169 lb 12.1 oz (77 kg)  06/20/24 162 lb 7.7 oz (73.7 kg)  06/14/24 183 lb 13.8 oz (83.4 kg)    GEN: No acute distress. NECK: No JVD; No carotid bruits. CARDIAC: ***RRR, no murmurs, rubs, gallops. RESPIRATORY:  Clear to auscultation. EXTREMITIES:  Warm and well-perfused. No edema.  ASSESSMENT AND PLAN Chest discomfort Coronary artery calcification Aortic atherosclerosis HLD ESRD        {Are you ordering a CV Procedure (e.g. stress test,  cath, DCCV, TEE, etc)?   Press F2        :[id number]}  Dispo: ***  Signed, Caron Poser, MD  "

## 2024-07-08 ENCOUNTER — Ambulatory Visit
